# Patient Record
Sex: Female | Born: 1950 | Hispanic: Refuse to answer
Health system: Western US, Academic
[De-identification: ages and names within clinical notes are randomized; demographics above are authoritative.]

## PROBLEM LIST (undated history)

## (undated) ENCOUNTER — Emergency Department (HOSPITAL_BASED_OUTPATIENT_CLINIC_OR_DEPARTMENT_OTHER): Admission: EM | Payer: Self-pay | Source: Home / Self Care

## (undated) DIAGNOSIS — F419 Anxiety disorder, unspecified: Secondary | ICD-10-CM

## (undated) DIAGNOSIS — K219 Gastro-esophageal reflux disease without esophagitis: Secondary | ICD-10-CM

## (undated) DIAGNOSIS — C50919 Malignant neoplasm of unspecified site of unspecified female breast: Secondary | ICD-10-CM

## (undated) DIAGNOSIS — I10 Essential (primary) hypertension: Secondary | ICD-10-CM

## (undated) DIAGNOSIS — A6000 Herpesviral infection of urogenital system, unspecified: Secondary | ICD-10-CM

## (undated) DIAGNOSIS — M545 Low back pain, unspecified: Secondary | ICD-10-CM

## (undated) DIAGNOSIS — K5900 Constipation, unspecified: Secondary | ICD-10-CM

## (undated) DIAGNOSIS — R110 Nausea: Secondary | ICD-10-CM

## (undated) DIAGNOSIS — K449 Diaphragmatic hernia without obstruction or gangrene: Secondary | ICD-10-CM

## (undated) DIAGNOSIS — F40240 Claustrophobia: Secondary | ICD-10-CM

## (undated) DIAGNOSIS — E785 Hyperlipidemia, unspecified: Secondary | ICD-10-CM

## (undated) DIAGNOSIS — J45909 Unspecified asthma, uncomplicated: Secondary | ICD-10-CM

## (undated) DIAGNOSIS — M170 Bilateral primary osteoarthritis of knee: Secondary | ICD-10-CM

## (undated) HISTORY — PX: UPPER GASTROINTESTINAL ENDOSCOPY: SHX5120

## (undated) HISTORY — PX: BREAST BIOPSY: SHX5072

## (undated) HISTORY — PX: OOPHORECTOMY: SHX5032

## (undated) HISTORY — PX: BREAST LUMPECTOMY: SHX5074

## (undated) HISTORY — DX: Essential (primary) hypertension: I10

## (undated) HISTORY — DX: Gastro-esophageal reflux disease without esophagitis: K21.9

## (undated) HISTORY — DX: Anxiety disorder, unspecified: F41.9

## (undated) HISTORY — DX: Malignant neoplasm of unspecified site of unspecified female breast: C50.919

## (undated) HISTORY — DX: Nausea: R11.0

## (undated) HISTORY — PX: JOINT REPLACEMENT: SHX530

## (undated) HISTORY — PX: OTHER SURGICAL HISTORY: SHX169

## (undated) HISTORY — PX: CHOLECYSTECTOMY: SHX55

## (undated) HISTORY — PX: OVARY SURGERY: SHX727

## (undated) HISTORY — PX: REPLACEMENT TOTAL KNEE: SUR1224

## (undated) HISTORY — DX: Hyperlipidemia, unspecified: E78.5

## (undated) HISTORY — DX: Herpesviral infection of urogenital system, unspecified: A60.00

## (undated) HISTORY — PX: TOOTH EXTRACTION: SUR596

## (undated) HISTORY — PX: APPENDECTOMY (OPEN): SHX54

## (undated) HISTORY — DX: Bilateral primary osteoarthritis of knee: M17.0

---

## 2002-12-16 ENCOUNTER — Emergency Department: Admit: 2002-12-16 | Payer: Self-pay | Source: Emergency Department | Admitting: Emergency Medicine

## 2002-12-16 ENCOUNTER — Observation Stay
Admission: AD | Admit: 2002-12-16 | Disposition: A | Discharge status: Home / Self Care | Payer: Self-pay | Source: Ambulatory Visit | Admitting: Internal Medicine

## 2004-06-08 ENCOUNTER — Emergency Department: Admit: 2004-06-08 | Payer: Self-pay | Source: Emergency Department | Admitting: Emergency Medicine

## 2004-11-02 DIAGNOSIS — D352 Benign neoplasm of pituitary gland: Secondary | ICD-10-CM | POA: Insufficient documentation

## 2004-11-02 DIAGNOSIS — E7800 Pure hypercholesterolemia, unspecified: Secondary | ICD-10-CM | POA: Insufficient documentation

## 2006-09-20 ENCOUNTER — Emergency Department: Admit: 2006-09-20 | Payer: Self-pay | Source: Emergency Department | Admitting: Emergency Medicine

## 2006-09-20 LAB — CBC WITH AUTO DIFFERENTIAL CERNER
Basophils Absolute: 0 /mm3 (ref 0.0–0.2)
Basophils: 0 % (ref 0–2)
Eosinophils Absolute: 0 /mm3 (ref 0.0–0.2)
Eosinophils: 1 % (ref 0–5)
Granulocytes Absolute: 5.4 /mm3 (ref 1.8–8.1)
Hematocrit: 43 % (ref 37.0–47.0)
Hgb: 14.8 G/DL (ref 12.0–16.0)
Lymphocytes Absolute: 1.1 /mm3 (ref 0.5–4.4)
Lymphocytes: 15 % (ref 15–41)
MCH: 29 PG (ref 28.0–32.0)
MCHC: 34.4 G/DL (ref 32.0–36.0)
MCV: 84 FL (ref 80.0–100.0)
MPV: 7.7 FL (ref 7.4–10.4)
Monocytes Absolute: 0.5 /mm3 (ref 0.0–1.2)
Monocytes: 7 % (ref 0–11)
Neutrophils %: 77 % — ABNORMAL HIGH (ref 52–75)
Platelets: 341 /mm3 (ref 140–400)
RBC: 5.11 /mm3 (ref 4.20–5.40)
RDW: 14.4 % (ref 11.5–15.0)
WBC: 7 /mm3 (ref 3.5–10.8)

## 2006-09-20 LAB — BASIC METABOLIC PANEL - AH CERNER
Anion Gap: 14 mEq/L (ref 5–15)
BUN: 12 mg/dL (ref 6–20)
CO2: 27.2 mEq/L (ref 22.0–29.0)
Calcium: 9.4 mg/dL (ref 8.4–10.2)
Chloride: 99 mEq/L (ref 96–108)
Creatinine: 0.7 mg/dL (ref 0.4–1.1)
Glucose: 115 mg/dL
Osmolality Calculated: 291 mosm/kg (ref 282–298)
Potassium: 3.6 mEq/L (ref 3.3–5.1)
Sodium: 140 mEq/L (ref 135–145)
UN/CREA SOFT: 17 RATIO (ref 6–33)

## 2006-09-20 LAB — GFR

## 2006-09-20 LAB — HEMOLYSIS INDEX: Hemolysis Index: 9 Units

## 2006-11-02 HISTORY — PX: HYSTERECTOMY: SHX81

## 2007-02-07 ENCOUNTER — Observation Stay
Admission: EM | Admit: 2007-02-07 | Disposition: A | Discharge status: Home / Self Care | Payer: Self-pay | Source: Emergency Department | Admitting: Internal Medicine

## 2007-02-07 LAB — CBC WITH AUTO DIFFERENTIAL CERNER
Basophils Absolute: 0 /mm3 (ref 0.0–0.2)
Basophils: 0 % (ref 0–2)
Eosinophils Absolute: 0.1 /mm3 (ref 0.0–0.2)
Eosinophils: 1 % (ref 0–5)
Granulocytes Absolute: 4.2 /mm3 (ref 1.8–8.1)
Hematocrit: 42.4 % (ref 37.0–47.0)
Hgb: 13.9 G/DL (ref 12.0–16.0)
Lymphocytes Absolute: 1.1 /mm3 (ref 0.5–4.4)
Lymphocytes: 18 % (ref 15–41)
MCH: 27.9 PG — ABNORMAL LOW (ref 28.0–32.0)
MCHC: 32.7 G/DL (ref 32.0–36.0)
MCV: 85.3 FL (ref 80.0–100.0)
MPV: 7.4 FL (ref 7.4–10.4)
Monocytes Absolute: 0.6 /mm3 (ref 0.0–1.2)
Monocytes: 10 % (ref 0–11)
Neutrophils %: 71 % (ref 52–75)
Platelets: 313 /mm3 (ref 140–400)
RBC: 4.97 /mm3 (ref 4.20–5.40)
RDW: 14.8 % (ref 11.5–15.0)
WBC: 5.9 /mm3 (ref 3.5–10.8)

## 2007-02-07 LAB — URINALYSIS WITH MICROSCOPIC
Bilirubin, UA: NEGATIVE
Blood, UA: NEGATIVE
Glucose, UA: NEGATIVE
Ketones UA: NEGATIVE
Leukocyte Esterase, UA: NEGATIVE
Nitrite, UA: NEGATIVE
Protein, UR: NEGATIVE
Specific Gravity UA POCT: 1.003 — ABNORMAL LOW (ref 1.005–1.030)
Urine pH: 7 (ref 4.6–8.0)
Urobilinogen, UA: 0.2 EU/dL (ref 0.2–1.0)

## 2007-02-07 LAB — COMPREHENSIVE METABOLIC PANEL - AH CERNER
ALT: 10 U/L (ref 0–31)
AST (SGOT): 15 U/L (ref 0–31)
Albumin/Globulin Ratio: 1.4 (ref 1.1–2.2)
Albumin: 4.3 g/dL (ref 3.4–4.8)
Alkaline Phosphatase: 92 U/L (ref 35–104)
Anion Gap: 9 mEq/L (ref 5–15)
BUN: 11 mg/dL (ref 6–20)
Bilirubin, Total: 0.4 mg/dL (ref 0.0–1.0)
CA: 4.4 mEq/L (ref 3.8–4.6)
CO2: 29.1 mEq/L — ABNORMAL HIGH (ref 22.0–29.0)
Calcium: 10.1 mg/dL (ref 8.4–10.2)
Chloride: 101 mEq/L (ref 96–108)
Creatinine: 0.5 mg/dL (ref 0.4–1.1)
Globulin: 3 g/dL (ref 2.0–3.6)
Glucose: 102 mg/dL
Osmolality Calculated: 288 mosm/kg (ref 282–298)
Potassium: 3.3 mEq/L (ref 3.3–5.1)
Protein, Total: 7.3 g/dL (ref 6.4–8.3)
Sodium: 139 mEq/L (ref 133–145)
UN/CREA SOFT: 22 RATIO (ref 6–33)

## 2007-02-07 LAB — LIPID STUDY FASTING AM CERNER
Cholesterol / HDL Ratio: 5.02 RATIO — ABNORMAL HIGH (ref ?–4.47)
Cholesterol: 286 mg/dL — ABNORMAL HIGH (ref 100–199)
HDL: 57 mg/dL — ABNORMAL LOW (ref >65)
LDL: 213 mg/dL — ABNORMAL HIGH (ref ?–130)
Triglycerides: 82 md/dL (ref 10–190)
VLDL: 16 mg/dL (ref 0–40)

## 2007-02-07 LAB — TROPONIN I: Troponin I: 0 ng/mL — AB (ref 0.10–1.30)

## 2007-02-07 LAB — T4, FREE: T4 Free: 1.36 ng/dL (ref 0.94–1.65)

## 2007-02-07 LAB — CREATINE KINASE W/O REFLEX (SOFT): Creatine Kinase (CK): 55 U/L (ref 24–170)

## 2007-02-07 LAB — PT AND APTT
PT INR: 1.1 {INR} (ref 0.9–1.1)
PT: 12.5 s (ref 10.8–13.3)
PTT: 26 s (ref 21–32)

## 2007-02-07 LAB — GFR

## 2007-02-07 LAB — HEMOLYSIS INDEX: Hemolysis Index: 4 Units

## 2007-02-07 LAB — TSH: TSH: 2.51 u[IU]/mL (ref 0.400–4.610)

## 2007-02-08 LAB — CREATINE KINASE W/O REFLEX (SOFT)
Creatine Kinase (CK): 45 U/L (ref 24–170)
Creatine Kinase (CK): 46 U/L (ref 24–170)

## 2007-02-08 LAB — TROPONIN I
Troponin I: 0 ng/mL — AB (ref 0.10–1.30)
Troponin I: 0 ng/mL — AB (ref 0.10–1.30)

## 2007-02-08 LAB — HEMOLYSIS INDEX
Hemolysis Index: 1 Units
Hemolysis Index: 3 Units

## 2007-03-18 ENCOUNTER — Emergency Department: Admit: 2007-03-18 | Payer: Self-pay | Source: Emergency Department | Admitting: Emergency Medicine

## 2007-03-18 LAB — CBC WITH AUTO DIFFERENTIAL CERNER
Basophils Absolute: 0 /mm3 (ref 0.0–0.2)
Basophils: 0 % (ref 0–2)
Eosinophils Absolute: 0.1 /mm3 (ref 0.0–0.2)
Eosinophils: 2 % (ref 0–5)
Granulocytes Absolute: 4.3 /mm3 (ref 1.8–8.1)
Hematocrit: 41 % (ref 37.0–47.0)
Hgb: 13.6 G/DL (ref 12.0–16.0)
Lymphocytes Absolute: 1.1 /mm3 (ref 0.5–4.4)
Lymphocytes: 18 % (ref 15–41)
MCH: 28 PG (ref 28.0–32.0)
MCHC: 33.2 G/DL (ref 32.0–36.0)
MCV: 84.3 FL (ref 80.0–100.0)
MPV: 7.6 FL (ref 7.4–10.4)
Monocytes Absolute: 0.6 /mm3 (ref 0.0–1.2)
Monocytes: 9 % (ref 0–11)
Neutrophils %: 71 % (ref 52–75)
Platelets: 284 /mm3 (ref 140–400)
RBC: 4.86 /mm3 (ref 4.20–5.40)
RDW: 13.9 % (ref 11.5–15.0)
WBC: 6.1 /mm3 (ref 3.5–10.8)

## 2007-03-18 LAB — URINALYSIS WITH MICROSCOPIC
Bilirubin, UA: NEGATIVE
Blood, UA: NEGATIVE
Glucose, UA: NEGATIVE
Ketones UA: NEGATIVE
Leukocyte Esterase, UA: NEGATIVE
Nitrite, UA: NEGATIVE
Protein, UR: NEGATIVE
Specific Gravity UA POCT: 1.015 (ref 1.005–1.030)
Squamous Epithelial Cells, Urine: 4 /LPF — ABNORMAL HIGH (ref 0–0)
Urine pH: 6 (ref 4.6–8.0)
Urobilinogen, UA: 0.2 EU/dL (ref 0.2–1.0)

## 2007-03-18 LAB — COMPREHENSIVE METABOLIC PANEL - AH CERNER
ALT: 16 U/L (ref 0–31)
AST (SGOT): 33 U/L — ABNORMAL HIGH (ref 0–31)
Albumin/Globulin Ratio: 1.2 (ref 1.1–2.2)
Albumin: 4.1 g/dL (ref 3.4–4.8)
Alkaline Phosphatase: 94 U/L (ref 35–104)
Anion Gap: 10 mEq/L (ref 5–15)
BUN: 12 mg/dL (ref 6–20)
Bilirubin, Total: 0.4 mg/dL (ref 0.0–1.0)
CA: 4.1 mEq/L (ref 3.8–4.6)
CO2: 28.3 mEq/L (ref 22.0–29.0)
Calcium: 9.5 mg/dL (ref 8.4–10.2)
Chloride: 102 mEq/L (ref 96–108)
Creatinine: 0.7 mg/dL (ref 0.4–1.1)
Globulin: 3.3 g/dL (ref 2.0–3.6)
Glucose: 110 mg/dL
Osmolality Calculated: 290 mosm/kg (ref 282–298)
Potassium: 4.5 mEq/L (ref 3.3–5.1)
Protein, Total: 7.4 g/dL (ref 6.4–8.3)
Sodium: 140 mEq/L (ref 133–145)
UN/CREA SOFT: 17 RATIO (ref 6–33)

## 2007-03-18 LAB — CREATINE KINASE W/O REFLEX (SOFT): Creatine Kinase (CK): 84 U/L (ref 24–170)

## 2007-03-18 LAB — HEMOLYSIS INDEX: Hemolysis Index: 368 Units

## 2007-03-18 LAB — TROPONIN I: Troponin I: 0 ng/mL — AB (ref 0.10–1.30)

## 2007-03-18 LAB — PT AND APTT
PT INR: 1.1 {INR} (ref 0.9–1.1)
PT: 13 s (ref 10.8–13.3)
PTT: 29 s (ref 21–32)

## 2007-03-18 LAB — GFR

## 2007-04-26 ENCOUNTER — Emergency Department: Admit: 2007-04-26 | Payer: Self-pay | Source: Emergency Department | Admitting: Emergency Medicine

## 2007-04-26 LAB — I-STAT TROPONIN CERNER: i-STAT Troponin: 0 ng/mL

## 2007-04-26 LAB — GFR

## 2007-04-26 LAB — CBC WITH AUTO DIFFERENTIAL CERNER
Basophils Absolute: 0 /mm3 (ref 0.0–0.2)
Basophils: 0 % (ref 0–2)
Eosinophils Absolute: 0.1 /mm3 (ref 0.0–0.7)
Eosinophils: 1 % (ref 0–5)
Granulocytes Absolute: 4.8 /mm3 (ref 1.8–8.1)
Hematocrit: 42.3 % (ref 37.0–47.0)
Hgb: 14.5 G/DL (ref 12.0–16.0)
Lymphocytes Absolute: 1.5 /mm3 (ref 0.5–4.4)
Lymphocytes: 22 % (ref 15–41)
MCH: 28.8 PG (ref 28.0–32.0)
MCHC: 34.2 G/DL (ref 32.0–36.0)
MCV: 84.2 FL (ref 80.0–100.0)
MPV: 7.9 FL (ref 7.4–10.4)
Monocytes Absolute: 0.7 /mm3 (ref 0.0–1.2)
Monocytes: 10 % (ref 0–11)
Neutrophils %: 67 % (ref 52–75)
Platelets: 335 /mm3 (ref 140–400)
RBC: 5.03 /mm3 (ref 4.20–5.40)
RDW: 14.1 % (ref 11.5–15.0)
WBC: 7.1 /mm3 (ref 3.5–10.8)

## 2007-04-26 LAB — BASIC METABOLIC PANEL
BUN: 13 mg/dL (ref 8–20)
CO2: 30 mEq/L (ref 21–30)
Calcium: 9.6 mg/dL (ref 8.6–10.2)
Chloride: 103 mEq/L (ref 98–107)
Creatinine: 0.7 mg/dL (ref 0.6–1.5)
Glucose: 77 mg/dL (ref 70–100)
Potassium: 4.2 mEq/L (ref 3.6–5.0)
Sodium: 142 mEq/L (ref 136–146)

## 2007-04-26 LAB — CK: Creatine Kinase (CK): 56 U/L (ref 20–140)

## 2008-02-09 ENCOUNTER — Emergency Department: Admit: 2008-02-09 | Payer: Self-pay | Source: Emergency Department | Admitting: Emergency Medicine

## 2008-02-09 LAB — CBC AND DIFFERENTIAL
Basophils Absolute: 0 /mm3 (ref 0.0–0.2)
Basophils: 0 % (ref 0–2)
Eosinophils Absolute: 0.1 /mm3 (ref 0.0–0.7)
Eosinophils: 1 % (ref 0–5)
Granulocytes Absolute: 4.2 /mm3 (ref 1.8–8.1)
Hematocrit: 43.6 % (ref 37.0–47.0)
Hgb: 14.3 G/DL (ref 12.0–16.0)
Immature Granulocytes Absolute: 0 CUMM (ref 0.0–0.0)
Immature Granulocytes: 0 % (ref 0–1)
Lymphocytes Absolute: 1.3 /mm3 (ref 0.5–4.4)
Lymphocytes: 21 % (ref 15–41)
MCH: 29.2 PG (ref 28.0–32.0)
MCHC: 32.8 G/DL (ref 32.0–36.0)
MCV: 89.2 FL (ref 80.0–100.0)
MPV: 9.9 FL (ref 9.4–12.3)
Monocytes Absolute: 0.6 /mm3 (ref 0.0–1.2)
Monocytes: 10 % (ref 0–11)
Neutrophils %: 68 % (ref 52–75)
Platelets: 315 /mm3 (ref 140–400)
RBC: 4.89 /mm3 (ref 4.20–5.40)
RDW: 13.8 % (ref 11.5–15.0)
WBC: 6.11 /mm3 (ref 3.50–10.80)

## 2008-02-09 LAB — COMPREHENSIVE METABOLIC PANEL - AH CERNER
ALT: 15 U/L (ref 0–31)
AST (SGOT): 23 U/L (ref 0–31)
Albumin/Globulin Ratio: 1.3 (ref 1.1–2.2)
Albumin: 4 g/dL (ref 3.4–4.8)
Alkaline Phosphatase: 82 U/L (ref 35–104)
Anion Gap: 9 mEq/L (ref 5–15)
BUN: 9 mg/dL (ref 6–20)
Bilirubin, Total: 0.3 mg/dL (ref 0.0–1.0)
CA: 3.9 mEq/L (ref 3.8–4.6)
CO2: 28.6 mEq/L (ref 22.0–29.0)
Calcium: 8.9 mg/dL (ref 8.4–10.2)
Chloride: 101 mEq/L (ref 96–108)
Creatinine: 0.7 mg/dL (ref 0.4–1.1)
Globulin: 3.2 g/dL (ref 2.0–3.6)
Glucose: 109 mg/dL — ABNORMAL HIGH (ref 70–100)
Osmolality Calculated: 287 mosm/kg (ref 282–298)
Potassium: 4.2 mEq/L (ref 3.3–5.1)
Protein, Total: 7.2 g/dL (ref 6.4–8.3)
Sodium: 139 mEq/L (ref 133–145)
UN/CREA SOFT: 13 RATIO (ref 6–33)

## 2008-02-09 LAB — GFR

## 2008-02-09 LAB — HEMOLYSIS INDEX: Hemolysis Index: 141 Units

## 2008-02-09 LAB — TROPONIN I: Troponin I: 0.01 ng/mL

## 2008-02-09 LAB — CREATINE KINASE W/O REFLEX (SOFT): Creatine Kinase (CK): 58 U/L (ref 24–170)

## 2008-05-21 ENCOUNTER — Observation Stay
Admission: RE | Admit: 2008-05-21 | Disposition: A | Discharge status: Home / Self Care | Payer: Self-pay | Source: Ambulatory Visit | Admitting: Internal Medicine

## 2008-05-21 LAB — CBC
Hematocrit: 38.4 % (ref 37.0–47.0)
Hgb: 12.9 G/DL (ref 12.0–16.0)
MCH: 29 PG (ref 28.0–32.0)
MCHC: 33.6 G/DL (ref 32.0–36.0)
MCV: 86.3 FL (ref 80.0–100.0)
MPV: 9.4 FL (ref 9.4–12.3)
Platelets: 265 /mm3 (ref 140–400)
RBC: 4.45 /mm3 (ref 4.20–5.40)
RDW: 13.8 % (ref 11.5–15.0)
WBC: 6.2 /mm3 (ref 3.50–10.80)

## 2008-05-21 LAB — LIPID PANEL
Cholesterol: 240 mg/dL — ABNORMAL HIGH (ref 50–200)
HDL: 54 mg/dL (ref 35–86)
LDL Calculated: 171 mg/dL — ABNORMAL HIGH (ref 0–130)
Triglycerides: 76 mg/dL (ref 35–135)
VLDL Calculated: 15 mg/dL (ref 10–40)

## 2008-05-21 LAB — PT AND APTT
PT INR: 1.1 {INR} (ref 0.9–1.1)
PT: 13.1 s (ref 10.8–13.3)
PTT: 25 s (ref 21–32)

## 2008-05-21 LAB — BASIC METABOLIC PANEL
BUN: 8 mg/dL (ref 8–20)
CO2: 25 mEq/L (ref 21–30)
Calcium: 9.1 mg/dL (ref 8.6–10.2)
Chloride: 105 mEq/L (ref 98–107)
Creatinine: 0.7 mg/dL (ref 0.6–1.5)
Glucose: 98 mg/dL (ref 70–100)
Potassium: 3.3 mEq/L — ABNORMAL LOW (ref 3.6–5.0)
Sodium: 141 mEq/L (ref 136–146)

## 2008-05-21 LAB — GFR

## 2008-05-21 LAB — CK: Creatine Kinase (CK): 45 U/L (ref 20–140)

## 2008-05-21 LAB — TSH: TSH: 2.32 u[IU]/mL (ref 0.465–4.680)

## 2008-05-21 LAB — TROPONIN I QUANTITATIVE LEVEL CERNER: Troponin I: 0 ng/mL

## 2009-03-26 ENCOUNTER — Emergency Department: Admit: 2009-03-26 | Payer: Self-pay | Source: Emergency Department | Admitting: Emergency Medicine

## 2009-03-26 LAB — CBC AND DIFFERENTIAL
Basophils Absolute: 0 /mm3 (ref 0.0–0.2)
Basophils: 0 % (ref 0–2)
Eosinophils Absolute: 0.1 /mm3 (ref 0.0–0.7)
Eosinophils: 1 % (ref 0–5)
Granulocytes Absolute: 6.2 /mm3 (ref 1.8–8.1)
Hematocrit: 43.2 % (ref 37.0–47.0)
Hgb: 14.5 G/DL (ref 12.0–16.0)
Immature Granulocytes Absolute: 0 CUMM (ref 0.0–0.0)
Immature Granulocytes: 0 % (ref 0–1)
Lymphocytes Absolute: 1.3 /mm3 (ref 0.5–4.4)
Lymphocytes: 16 % (ref 15–41)
MCH: 28.5 PG (ref 28.0–32.0)
MCHC: 33.6 G/DL (ref 32.0–36.0)
MCV: 85 FL (ref 80.0–100.0)
MPV: 9.5 FL (ref 9.4–12.3)
Monocytes Absolute: 0.6 /mm3 (ref 0.0–1.2)
Monocytes: 7 % (ref 0–11)
Neutrophils %: 76 % — ABNORMAL HIGH (ref 52–75)
Platelets: 283 /mm3 (ref 140–400)
RBC: 5.08 /mm3 (ref 4.20–5.40)
RDW: 13.6 % (ref 11.5–15.0)
WBC: 8.21 /mm3 (ref 3.50–10.80)

## 2009-03-26 LAB — HEMOLYSIS INDEX: Hemolysis Index: 11 Units

## 2009-03-26 LAB — COMPREHENSIVE METABOLIC PANEL
ALT: 8 U/L (ref 0–55)
AST (SGOT): 11 U/L (ref 5–34)
Albumin/Globulin Ratio: 0.9 (ref 0.9–2.2)
Albumin: 3.3 g/dL — ABNORMAL LOW (ref 3.5–5.0)
Alkaline Phosphatase: 98 U/L (ref 40–150)
BUN: 13 mg/dL (ref 7–19)
Bilirubin, Total: 0.4 mg/dL (ref 0.2–1.2)
CO2: 27 mEq/L (ref 22–29)
Calcium: 9.3 mg/dL (ref 8.9–10.0)
Chloride: 103 mEq/L (ref 96–107)
Creatinine: 0.7 mg/dL (ref 0.6–1.0)
Globulin: 3.7 g/dL — ABNORMAL HIGH (ref 2.0–3.6)
Glucose: 98 mg/dL (ref 70–110)
Potassium: 3.5 mEq/L (ref 3.5–5.1)
Protein, Total: 7 g/dL (ref 6.0–8.3)
Sodium: 142 mEq/L (ref 136–145)

## 2009-03-26 LAB — GFR

## 2009-03-26 LAB — TROPONIN I: Troponin I: 0 ng/mL — AB

## 2009-03-26 LAB — CREATINE KINASE W/O REFLEX (SOFT): Creatine Kinase (CK): 32 U/L (ref 29–168)

## 2009-04-30 ENCOUNTER — Emergency Department: Admit: 2009-04-30 | Payer: Self-pay | Source: Emergency Department | Admitting: Emergency Medicine

## 2009-04-30 LAB — BASIC METABOLIC PANEL
BUN: 10 mg/dL (ref 7–19)
CO2: 27 mEq/L (ref 22–29)
Calcium: 9.3 mg/dL (ref 8.9–10.0)
Chloride: 103 mEq/L (ref 96–107)
Creatinine: 0.7 mg/dL (ref 0.6–1.0)
Glucose: 106 mg/dL (ref 70–110)
Potassium: 3.7 mEq/L (ref 3.5–5.1)
Sodium: 141 mEq/L (ref 136–145)

## 2009-04-30 LAB — GFR

## 2009-04-30 LAB — CBC AND DIFFERENTIAL
Basophils Absolute: 0 /mm3 (ref 0.0–0.2)
Basophils: 0 % (ref 0–2)
Eosinophils Absolute: 0.1 /mm3 (ref 0.0–0.7)
Eosinophils: 1 % (ref 0–5)
Granulocytes Absolute: 4.1 /mm3 (ref 1.8–8.1)
Hematocrit: 41.2 % (ref 37.0–47.0)
Hgb: 14.1 G/DL (ref 12.0–16.0)
Immature Granulocytes Absolute: 0 CUMM (ref 0.0–0.0)
Immature Granulocytes: 0 % (ref 0–1)
Lymphocytes Absolute: 1.6 /mm3 (ref 0.5–4.4)
Lymphocytes: 25 % (ref 15–41)
MCH: 28.4 PG (ref 28.0–32.0)
MCHC: 34.2 G/DL (ref 32.0–36.0)
MCV: 82.9 FL (ref 80.0–100.0)
MPV: 9.2 FL — ABNORMAL LOW (ref 9.4–12.3)
Monocytes Absolute: 0.5 /mm3 (ref 0.0–1.2)
Monocytes: 7 % (ref 0–11)
Neutrophils %: 66 % (ref 52–75)
Platelets: 313 /mm3 (ref 140–400)
RBC: 4.97 /mm3 (ref 4.20–5.40)
RDW: 14 % (ref 11.5–15.0)
WBC: 6.24 /mm3 (ref 3.50–10.80)

## 2009-04-30 LAB — URINALYSIS WITH MICROSCOPIC
Bilirubin, UA: NEGATIVE
Blood, UA: NEGATIVE
Glucose, UA: NEGATIVE
Ketones UA: NEGATIVE
Leukocyte Esterase, UA: NEGATIVE
Nitrite, UA: NEGATIVE
Protein, UR: NEGATIVE
RBC, UA: <=1 /HPF (ref 0–?)
Specific Gravity UA POCT: 1.005 (ref 1.005–1.030)
Squamous Epithelial Cells, Urine: 6 /HPF — ABNORMAL HIGH (ref 0–0)
Urine pH: 6 (ref 4.6–8.0)
Urobilinogen, UA: NORMAL mg/dL

## 2009-04-30 LAB — HEMOLYSIS INDEX: Hemolysis Index: 5 Units

## 2009-06-30 ENCOUNTER — Emergency Department: Admit: 2009-06-30 | Payer: Self-pay | Source: Emergency Department | Admitting: Emergency Medicine

## 2009-06-30 LAB — CBC AND DIFFERENTIAL
Basophils Absolute: 0 /mm3 (ref 0.0–0.2)
Basophils: 0 % (ref 0–2)
Eosinophils Absolute: 0.1 /mm3 (ref 0.0–0.7)
Eosinophils: 1 % (ref 0–5)
Granulocytes Absolute: 6.2 /mm3 (ref 1.8–8.1)
Hematocrit: 42.5 % (ref 37.0–47.0)
Hgb: 14.5 G/DL (ref 12.0–16.0)
Immature Granulocytes Absolute: 0 CUMM (ref 0.0–0.0)
Immature Granulocytes: 0 % (ref 0–1)
Lymphocytes Absolute: 2 /mm3 (ref 0.5–4.4)
Lymphocytes: 22 % (ref 15–41)
MCH: 28.7 PG (ref 28.0–32.0)
MCHC: 34.1 G/DL (ref 32.0–36.0)
MCV: 84.2 FL (ref 80.0–100.0)
MPV: 9.6 FL (ref 9.4–12.3)
Monocytes Absolute: 0.9 /mm3 (ref 0.0–1.2)
Monocytes: 9 % (ref 0–11)
Neutrophils %: 68 % (ref 52–75)
Platelets: 365 /mm3 (ref 140–400)
RBC: 5.05 /mm3 (ref 4.20–5.40)
RDW: 14.2 % (ref 11.5–15.0)
WBC: 9.12 /mm3 (ref 3.50–10.80)

## 2009-06-30 LAB — COMPREHENSIVE METABOLIC PANEL
ALT: 12 U/L (ref 0–55)
AST (SGOT): 12 U/L (ref 5–34)
Albumin/Globulin Ratio: 0.9 (ref 0.9–2.2)
Albumin: 3.4 g/dL — ABNORMAL LOW (ref 3.5–5.0)
Alkaline Phosphatase: 85 U/L (ref 40–150)
BUN: 12 mg/dL (ref 7–19)
Bilirubin, Total: 0.4 mg/dL (ref 0.2–1.2)
CO2: 27 mEq/L (ref 22–29)
Calcium: 9.9 mg/dL (ref 8.9–10.0)
Chloride: 102 mEq/L (ref 96–107)
Creatinine: 0.7 mg/dL (ref 0.6–1.0)
Globulin: 3.9 g/dL — ABNORMAL HIGH (ref 2.0–3.6)
Glucose: 102 mg/dL (ref 70–110)
Potassium: 3.8 mEq/L (ref 3.5–5.1)
Protein, Total: 7.3 g/dL (ref 6.0–8.3)
Sodium: 139 mEq/L (ref 136–145)

## 2009-06-30 LAB — PT AND APTT
PT INR: 1.1 {INR} (ref 0.9–1.1)
PT: 13.5 s — ABNORMAL HIGH (ref 10.8–13.3)
PTT: 26 s (ref 21–32)

## 2009-06-30 LAB — GFR

## 2009-06-30 LAB — HEMOLYSIS INDEX: Hemolysis Index: 9 Units

## 2010-04-13 ENCOUNTER — Emergency Department: Admit: 2010-04-13 | Payer: Self-pay | Source: Emergency Department | Admitting: Emergency Medicine

## 2010-04-13 LAB — CBC AND DIFFERENTIAL
Baso(Absolute): 0.02 10*3/uL (ref 0.00–0.20)
Basophils: 0 % (ref 0–2)
Eosinophils Absolute: 0.04 10*3/uL (ref 0.00–0.70)
Eosinophils: 1 % (ref 0–5)
Hematocrit: 39.7 % (ref 37.0–47.0)
Hgb: 13.6 g/dL (ref 12.0–16.0)
Immature Granulocytes Absolute: 0.02 10*3/uL
Immature Granulocytes: 0 % (ref 0–1)
Lymphocytes Absolute: 1.09 10*3/uL (ref 0.50–4.40)
Lymphocytes: 18 % (ref 15–41)
MCH: 28.6 pg (ref 28.0–32.0)
MCHC: 34.3 g/dL (ref 32.0–36.0)
MCV: 83.4 fL (ref 80.0–100.0)
MPV: 9.2 fL — ABNORMAL LOW (ref 9.4–12.3)
Monocytes Absolute: 0.46 10*3/uL (ref 0.00–1.20)
Monocytes: 8 % (ref 0–11)
Neutrophils Absolute: 4.52 10*3/uL
Neutrophils: 74 % (ref 52–75)
Platelets: 293 10*3/uL (ref 140–400)
RBC: 4.76 10*6/uL (ref 4.20–5.40)
RDW: 14 % (ref 12–15)
WBC: 6.13 10*3/uL (ref 3.50–10.80)

## 2010-04-13 LAB — COMPREHENSIVE METABOLIC PANEL
ALT: 14 U/L (ref 0–55)
AST (SGOT): 14 U/L (ref 5–34)
Albumin/Globulin Ratio: 1 (ref 0.9–2.2)
Albumin: 3.3 g/dL — ABNORMAL LOW (ref 3.5–5.0)
Alkaline Phosphatase: 95 U/L (ref 40–150)
Anion Gap: 9 (ref 5.0–15.0)
BUN: 6 mg/dL — ABNORMAL LOW (ref 7.0–19.0)
Bilirubin, Total: 0.2 mg/dL (ref 0.2–1.2)
CO2: 26 mEq/L (ref 22–29)
Calcium: 9.5 mg/dL (ref 8.5–10.5)
Chloride: 106 mEq/L (ref 98–107)
Creatinine: 0.7 mg/dL (ref 0.6–1.0)
Globulin: 3.3 g/dL (ref 2.0–3.6)
Glucose: 119 mg/dL — ABNORMAL HIGH (ref 70–100)
Potassium: 3.8 mEq/L (ref 3.5–5.1)
Protein, Total: 6.6 g/dL (ref 6.0–8.3)
Sodium: 141 mEq/L (ref 136–145)

## 2010-04-13 LAB — GFR: EGFR: >60

## 2010-04-13 LAB — TROPONIN I: Troponin I: 0.01 ng/mL (ref 0.00–0.09)

## 2010-04-13 LAB — HEMOLYSIS INDEX: Hemolysis Index: 4 Index (ref 0–18)

## 2010-12-23 ENCOUNTER — Emergency Department: Admit: 2010-12-23 | Payer: Self-pay | Source: Emergency Department | Admitting: Emergency Medicine

## 2010-12-23 LAB — COMPREHENSIVE METABOLIC PANEL
ALT: 9 U/L (ref 0–55)
AST (SGOT): 13 U/L (ref 5–34)
Albumin/Globulin Ratio: 0.9 (ref 0.9–2.2)
Albumin: 3.4 g/dL — ABNORMAL LOW (ref 3.5–5.0)
Alkaline Phosphatase: 91 U/L (ref 40–150)
Anion Gap: 13 (ref 5.0–15.0)
BUN: 13 mg/dL (ref 7.0–19.0)
Bilirubin, Total: 0.5 mg/dL (ref 0.2–1.2)
CO2: 24 mEq/L (ref 22–29)
Calcium: 9.5 mg/dL (ref 8.5–10.5)
Chloride: 106 mEq/L (ref 98–107)
Creatinine: 0.8 mg/dL (ref 0.6–1.0)
Globulin: 3.9 g/dL — ABNORMAL HIGH (ref 2.0–3.6)
Glucose: 104 mg/dL — ABNORMAL HIGH (ref 70–100)
Potassium: 3.4 mEq/L — ABNORMAL LOW (ref 3.5–5.1)
Protein, Total: 7.3 g/dL (ref 6.0–8.3)
Sodium: 143 mEq/L (ref 136–145)

## 2010-12-23 LAB — CBC AND DIFFERENTIAL
Basophils Absolute Automated: 0.02 10*3/uL (ref 0.00–0.20)
Basophils Automated: 0 % (ref 0–2)
Eosinophils Absolute Automated: 0.04 10*3/uL (ref 0.00–0.70)
Eosinophils Automated: 1 % (ref 0–5)
Hematocrit: 41.1 % (ref 37.0–47.0)
Hgb: 13.7 g/dL (ref 12.0–16.0)
Immature Granulocytes Absolute: 0.01 10*3/uL
Immature Granulocytes: 0 % (ref 0–1)
Lymphocytes Absolute Automated: 1.08 10*3/uL (ref 0.50–4.40)
Lymphocytes Automated: 19 % (ref 15–41)
MCH: 28.4 pg (ref 28.0–32.0)
MCHC: 33.3 g/dL (ref 32.0–36.0)
MCV: 85.1 fL (ref 80.0–100.0)
MPV: 9.3 fL — ABNORMAL LOW (ref 9.4–12.3)
Monocytes Absolute Automated: 0.38 10*3/uL (ref 0.00–1.20)
Monocytes: 7 % (ref 0–11)
Neutrophils Absolute: 4.21 10*3/uL (ref 1.80–8.10)
Neutrophils: 74 % (ref 52–75)
Nucleated RBC: 0 /100 WBC
Platelets: 269 10*3/uL (ref 140–400)
RBC: 4.83 10*6/uL (ref 4.20–5.40)
RDW: 14 % (ref 12–15)
WBC: 5.73 10*3/uL (ref 3.50–10.80)

## 2010-12-23 LAB — GFR: EGFR: >60

## 2010-12-23 LAB — IHS D-DIMER: D-Dimer: 0.22 ug/mL FEU (ref 0.00–0.49)

## 2010-12-23 LAB — CK: Creatine Kinase (CK): 22 U/L — ABNORMAL LOW (ref 29–168)

## 2010-12-23 LAB — HEMOLYSIS INDEX: Hemolysis Index: 53 Index — ABNORMAL HIGH (ref 0–18)

## 2010-12-23 LAB — TROPONIN I: Troponin I: 0.01 ng/mL (ref 0.00–0.09)

## 2010-12-27 ENCOUNTER — Emergency Department: Admit: 2010-12-27 | Payer: Self-pay | Source: Emergency Department | Admitting: Emergency Medicine

## 2010-12-28 LAB — CBC AND DIFFERENTIAL
Basophils Absolute Automated: 0.01 10*3/uL (ref 0.00–0.20)
Basophils Automated: 0 % (ref 0–2)
Eosinophils Absolute Automated: 0.08 10*3/uL (ref 0.00–0.70)
Eosinophils Automated: 1 % (ref 0–5)
Hematocrit: 39.9 % (ref 37.0–47.0)
Hgb: 13.6 g/dL (ref 12.0–16.0)
Immature Granulocytes Absolute: 0.03 10*3/uL
Immature Granulocytes: 0 % (ref 0–1)
Lymphocytes Absolute Automated: 1.72 10*3/uL (ref 0.50–4.40)
Lymphocytes Automated: 23 % (ref 15–41)
MCH: 28.6 pg (ref 28.0–32.0)
MCHC: 34.1 g/dL (ref 32.0–36.0)
MCV: 83.8 fL (ref 80.0–100.0)
MPV: 9.2 fL — ABNORMAL LOW (ref 9.4–12.3)
Monocytes Absolute Automated: 0.58 10*3/uL (ref 0.00–1.20)
Monocytes: 8 % (ref 0–11)
Neutrophils Absolute: 5.06 10*3/uL (ref 1.80–8.10)
Neutrophils: 68 % (ref 52–75)
Nucleated RBC: 0 /100 WBC
Platelets: 253 10*3/uL (ref 140–400)
RBC: 4.76 10*6/uL (ref 4.20–5.40)
RDW: 14 % (ref 12–15)
WBC: 7.45 10*3/uL (ref 3.50–10.80)

## 2010-12-28 LAB — CREATININE WHOLE BLOOD: Whole Blood Creatinine: 0.61 mg/dL (ref 0.40–1.10)

## 2011-07-12 ENCOUNTER — Emergency Department
Admit: 2011-07-12 | Disposition: A | Discharge status: Home / Self Care | Payer: Self-pay | Source: Emergency Department | Admitting: Emergency Medicine

## 2011-07-12 LAB — CBC AND DIFFERENTIAL
Basophils Absolute Automated: 0.02 10*3/uL (ref 0.00–0.20)
Basophils Automated: 0 % (ref 0–2)
Eosinophils Absolute Automated: 0.09 10*3/uL (ref 0.00–0.70)
Eosinophils Automated: 1 % (ref 0–5)
Hematocrit: 39.9 % (ref 37.0–47.0)
Hgb: 13.5 g/dL (ref 12.0–16.0)
Immature Granulocytes Absolute: 0.04 10*3/uL
Immature Granulocytes: 1 % (ref 0–1)
Lymphocytes Absolute Automated: 1.93 10*3/uL (ref 0.50–4.40)
Lymphocytes Automated: 28 % (ref 15–41)
MCH: 28.4 pg (ref 28.0–32.0)
MCHC: 33.8 g/dL (ref 32.0–36.0)
MCV: 83.8 fL (ref 80.0–100.0)
MPV: 9.5 fL (ref 9.4–12.3)
Monocytes Absolute Automated: 0.59 10*3/uL (ref 0.00–1.20)
Monocytes: 9 % (ref 0–11)
Neutrophils Absolute: 4.25 10*3/uL (ref 1.80–8.10)
Neutrophils: 62 % (ref 52–75)
Nucleated RBC: 0 /100 WBC
Platelets: 268 10*3/uL (ref 140–400)
RBC: 4.76 10*6/uL (ref 4.20–5.40)
RDW: 14 % (ref 12–15)
WBC: 6.88 10*3/uL (ref 3.50–10.80)

## 2011-07-12 LAB — COMPREHENSIVE METABOLIC PANEL
ALT: 13 U/L (ref 0–55)
AST (SGOT): 19 U/L (ref 5–34)
Albumin/Globulin Ratio: 1 (ref 0.9–2.2)
Albumin: 3.4 g/dL — ABNORMAL LOW (ref 3.5–5.0)
Alkaline Phosphatase: 94 U/L (ref 40–150)
Anion Gap: 13 (ref 5.0–15.0)
BUN: 14 mg/dL (ref 7.0–19.0)
Bilirubin, Total: 0.2 mg/dL (ref 0.2–1.2)
CO2: 25 mEq/L (ref 22–29)
Calcium: 9.5 mg/dL (ref 8.5–10.5)
Chloride: 104 mEq/L (ref 98–107)
Creatinine: 0.6 mg/dL (ref 0.6–1.0)
Globulin: 3.5 g/dL (ref 2.0–3.6)
Glucose: 118 mg/dL — ABNORMAL HIGH (ref 70–100)
Potassium: 3.9 mEq/L (ref 3.5–5.1)
Protein, Total: 6.9 g/dL (ref 6.0–8.3)
Sodium: 142 mEq/L (ref 136–145)

## 2011-07-12 LAB — TROPONIN I: Troponin I: <0.01 ng/mL (ref 0.00–0.09)

## 2011-07-12 LAB — GFR: EGFR: >60

## 2011-07-12 LAB — HEMOLYSIS INDEX: Hemolysis Index: 80 Index — ABNORMAL HIGH (ref 0–18)

## 2011-07-14 LAB — ECG 12-LEAD
Atrial Rate: 77 {beats}/min
Atrial Rate: 80 {beats}/min
P Axis: 56 degrees
P Axis: 66 degrees
P-R Interval: 158 ms
P-R Interval: 176 ms
Q-T Interval: 366 ms
Q-T Interval: 388 ms
QRS Duration: 82 ms
QRS Duration: 82 ms
QTC Calculation (Bezet): 422 ms
QTC Calculation (Bezet): 439 ms
R Axis: -11 degrees
R Axis: -13 degrees
T Axis: 12 degrees
T Axis: 3 degrees
Ventricular Rate: 77 {beats}/min
Ventricular Rate: 80 {beats}/min

## 2011-07-20 LAB — ECG 12-LEAD
Atrial Rate: 73 {beats}/min
P Axis: 61 degrees
P-R Interval: 168 ms
Q-T Interval: 384 ms
QRS Duration: 84 ms
QTC Calculation (Bezet): 423 ms
R Axis: -14 degrees
T Axis: 4 degrees
Ventricular Rate: 73 {beats}/min

## 2011-07-27 LAB — ECG 12-LEAD
Atrial Rate: 81 {beats}/min
P Axis: 68 degrees
P-R Interval: 160 ms
Q-T Interval: 372 ms
QRS Duration: 80 ms
QTC Calculation (Bezet): 432 ms
R Axis: -11 degrees
T Axis: 23 degrees
Ventricular Rate: 81 {beats}/min

## 2011-07-28 LAB — ECG 12-LEAD
Atrial Rate: 77 {beats}/min
P Axis: 70 degrees
P-R Interval: 152 ms
Q-T Interval: 366 ms
QRS Duration: 78 ms
QTC Calculation (Bezet): 414 ms
R Axis: -6 degrees
T Axis: 10 degrees
Ventricular Rate: 77 {beats}/min

## 2011-08-02 LAB — ECG 12-LEAD
Atrial Rate: 66 {beats}/min
P Axis: 72 degrees
P-R Interval: 168 ms
Q-T Interval: 380 ms
QRS Duration: 80 ms
QTC Calculation (Bezet): 398 ms
R Axis: -8 degrees
T Axis: 1 degrees
Ventricular Rate: 66 {beats}/min

## 2011-08-09 LAB — ECG 12-LEAD
Atrial Rate: 81 {beats}/min
P Axis: 68 degrees
P-R Interval: 158 ms
Q-T Interval: 396 ms
QRS Duration: 84 ms
QTC Calculation (Bezet): 460 ms
R Axis: -12 degrees
T Axis: 24 degrees
Ventricular Rate: 81 {beats}/min

## 2011-08-10 LAB — ECG 12-LEAD
Atrial Rate: 109 {beats}/min
Atrial Rate: 75 {beats}/min
P Axis: 41 degrees
P Axis: 73 degrees
P-R Interval: 154 ms
P-R Interval: 164 ms
Q-T Interval: 316 ms
Q-T Interval: 376 ms
QRS Duration: 80 ms
QRS Duration: 82 ms
QTC Calculation (Bezet): 419 ms
QTC Calculation (Bezet): 425 ms
R Axis: -3 degrees
R Axis: 32 degrees
T Axis: 47 degrees
T Axis: 8 degrees
Ventricular Rate: 109 {beats}/min
Ventricular Rate: 75 {beats}/min

## 2011-08-19 LAB — ECG 12-LEAD
Atrial Rate: 73 {beats}/min
P Axis: 67 degrees
P-R Interval: 166 ms
Q-T Interval: 398 ms
QRS Duration: 84 ms
QTC Calculation (Bezet): 438 ms
R Axis: -2 degrees
T Axis: 1 degrees
Ventricular Rate: 73 {beats}/min

## 2011-08-20 ENCOUNTER — Emergency Department
Admit: 2011-08-20 | Disposition: A | Discharge status: Home / Self Care | Payer: Self-pay | Source: Emergency Department | Admitting: Emergency Medicine

## 2011-08-20 LAB — CBC AND DIFFERENTIAL
Basophils Absolute Automated: 0.02 10*3/uL (ref 0.00–0.20)
Basophils Automated: 0 % (ref 0–2)
Eosinophils Absolute Automated: 0.1 10*3/uL (ref 0.00–0.70)
Eosinophils Automated: 1 % (ref 0–5)
Hematocrit: 41.7 % (ref 37.0–47.0)
Hgb: 14.2 g/dL (ref 12.0–16.0)
Immature Granulocytes Absolute: 0.03 10*3/uL
Immature Granulocytes: 0 % (ref 0–1)
Lymphocytes Absolute Automated: 2.45 10*3/uL (ref 0.50–4.40)
Lymphocytes Automated: 30 % (ref 15–41)
MCH: 28.5 pg (ref 28.0–32.0)
MCHC: 34.1 g/dL (ref 32.0–36.0)
MCV: 83.6 fL (ref 80.0–100.0)
MPV: 9.8 fL (ref 9.4–12.3)
Monocytes Absolute Automated: 0.78 10*3/uL (ref 0.00–1.20)
Monocytes: 9 % (ref 0–11)
Neutrophils Absolute: 4.94 10*3/uL (ref 1.80–8.10)
Neutrophils: 60 % (ref 52–75)
Nucleated RBC: 0 /100 WBC
Platelets: 336 10*3/uL (ref 140–400)
RBC: 4.99 10*6/uL (ref 4.20–5.40)
RDW: 14 % (ref 12–15)
WBC: 8.29 10*3/uL (ref 3.50–10.80)

## 2011-08-20 LAB — COMPREHENSIVE METABOLIC PANEL
ALT: 14 U/L (ref 0–55)
AST (SGOT): 13 U/L (ref 5–34)
Albumin/Globulin Ratio: 1.1 (ref 0.9–2.2)
Albumin: 3.7 g/dL (ref 3.5–5.0)
Alkaline Phosphatase: 110 U/L (ref 40–150)
Anion Gap: 11 (ref 5.0–15.0)
BUN: 13 mg/dL (ref 7.0–19.0)
Bilirubin, Total: 0.3 mg/dL (ref 0.2–1.2)
CO2: 26 mEq/L (ref 22–29)
Calcium: 9.6 mg/dL (ref 8.5–10.5)
Chloride: 101 mEq/L (ref 98–107)
Creatinine: 0.8 mg/dL (ref 0.6–1.0)
Globulin: 3.5 g/dL (ref 2.0–3.6)
Glucose: 106 mg/dL — ABNORMAL HIGH (ref 70–100)
Potassium: 2.9 mEq/L — ABNORMAL LOW (ref 3.5–5.1)
Protein, Total: 7.2 g/dL (ref 6.0–8.3)
Sodium: 138 mEq/L (ref 136–145)

## 2011-08-20 LAB — TROPONIN I: Troponin I: 0.02 ng/mL (ref 0.00–0.09)

## 2011-08-20 LAB — GFR: EGFR: >60

## 2011-08-20 LAB — LIPASE: Lipase: 15 U/L (ref 8–78)

## 2011-08-20 LAB — HEMOLYSIS INDEX: Hemolysis Index: 25 Index — ABNORMAL HIGH (ref 0–18)

## 2011-08-20 NOTE — Procedures (Signed)
PATIENT:          Amber [name]      CARDIOLOGIST: Lysle Rubens, MD      REFERRING PHYSICIAN:      MEDICAL RECORD NUMBER:    [id number]      DATE OF STUDY:    02/08/2007      PATIENT LOCATION/SERVICE: [id number]            ADENOSINE STRESS TEST            INDICATIONS:  Chest pain.            DESCRIPTION: Informed consent was obtained.  Adenosine was infused over 6      minutes with injection of a nuclear tracer agent at 3 minutes.  The patient      experienced no symptoms.  There were no diagnostic electrocardiogram      changes.  There were no arrhythmias.  Blood pressure response was      physiologic.            CONCLUSIONS:      1. Uneventful adenosine infusion.      2. Nuclear images pending radiology interpretation.                              Electronic Signing MD: Lysle Rubens, MD            JAJ:amw:CCA      D:   02/08/2007      T:   02/09/2007      #:   [id number]      N:   [id number]            cc:  Lysle Rubens, MD           Medical Records Clemon Chambers, MD

## 2011-08-20 NOTE — H&P (Unsigned)
PATIENT:                       Amber [name], Amber [name]      MEDICAL RECORD NUMBER:         [id number]      PATIENT LOCATION/SERVICE:      [id number]            DATE OF ADMISSION:             02/07/2007      ATTENDING PHYSICIAN:           Pershing Cox, MD            ADMITTING DIAGNOSIS:  Atypical chest discomfort.            OTHER DIAGNOSES:  Hypertension and hypercholesterolemia.            HISTORY OF PRESENT ILLNESS:  The patient is 76, admitted through the ER      because of atypical chest discomfort, lightheadedness, somewhat nausea.      The patient denies any abdominal pain, diarrhea, history of recent intake      of moderate to severe alcohol over the weekend.  Denies any orthopnea or      PND.            PAST MEDICAL HISTORY:  Hypertension and hypercholesterolemia.  Also, at      admission, she described some palpitations.            ALLERGIES:  No allergies, does not tolerate aspirin well.            SOCIAL HISTORY:  Negative smoking, moderate alcohol.            REVIEW OF SYSTEMS:  No orthopnea, no PND.  No abdominal pain.  No diarrhea.      No skin rash.  No headache, no double vision, no blurring of vision.            PHYSICAL EXAMINATION:  Patient is a middle-aged woman not in any acute      distress.  Skin:  Normal.  Sclerae anicteric.  CVS:  S1, S2.  Lungs:      Clear.  Abdomen:  Bowel sounds positive, nontender.  Extremities:  Edema      negative.  Pulses equal and bilateral.  Vitals:  Blood pressure of 150/80,      pulse of 76, respiratory rate 18.            EKG:  Normal sinus rhythm.  No acute ST elevation noted.            LABORATORY DATA:  Labs at the time of admission were:  WBC of 5.9,      hemoglobin 13, hematocrit 42, platelets 313.  Sodium 139, potassium 3.3,      BUN 11, creatinine 0.5.  First troponin was negative.            ASSESSMENT AND PLAN:      1.   Atypical chest discomfort.  Risk factors of hypertension,      hypercholesterolemia.  Will admit patient on telemetry.  Cardiac enzymes,       monitor.  Will do a lipid panel, free T4, TSH, stress thallium in the      morning by cardiovascular group.  Patient put on ulcer prophylaxis and deep      venous thrombosis prophylaxis.      2.   Hypertension.  Started on Norvasc.  Will follow  patient.                                    ___________________________________        Date Signed: _______________      Pershing Cox, MD                  A [id number]      D:    02/07/2007      T:    02/08/2007      #:    [id number]      N:    [id number]            cc:   Pershing Cox, MD

## 2011-08-20 NOTE — Procedures (Signed)
PATIENT:                       Amber [name], Amber [name]      MEDICAL RECORD NUMBER:         [id number]      PATIENT LOCATION/SERVICE:      [id number]            DATE OF PROCEDURE:             02/08/2007      PHYSICIAN:                     Bertram Millard Mignon Bechler, MD      REFERRING PHYSICIAN:            TAPE NO.: [id number]      COUNTER NO.: [id number]            CLINICAL INDICATIONS: Chest pain, abnormal heart sounds.                                            M-MODE/2-D            3.3      cm        Left Atrium                               (19-52mm)      2.6      cm        Aortic Root                               (19-76mm)      --       cm        Aortic Leaflet Separation                 (15-56mm)      --       cm        RVd                                       (7-5mm)      3.2      cm        LVs                                       (20-69mm)      4.6      cm        LVd                                       (37-57mm)      1.1      cm        IVSd                                      (6-7mm)      1.0  cm        LVPWd                                     (6-92mm)      31       %         Fractional Shortening                     (25-45%)      60-65    %         Estimated Ejection Fraction               (>55%)            No pericardial effusion.  No pleural effusion.                                                  DOPPLER                                   PEAK       MEAN    DEGREE OF VALVULAR   VALVE        VALVE    PEAK VELOCITY   GRADIENT   GRADIENT     REGURGITATION      AREA      Mitral        0.7           mmHg       mmHg                          cm. sq.                 m/s(0.6-1.3)      Aortic        1.3           mmHg       mmHg                          cm. sq.                 m/s(1.0-1.7)      Pulmonic      1.0           mmHg       mmHg          Mild            cm. sq.                 m/s(0.6-0.9)      Tricuspid     0.8           mmHg       mmHg          Trace           cm. sq.                 m/s(0.3-0.7)      LVOT          1.0           ---------  ---------      ---------      --------  m/s(0.7-1.7)    -                                        -            TWO-DIMENSIONAL AND DOPPLER COMMENTS:         1. Technically good study.         2. Normal left ventricular size, thickness and function with estimated         ejection fraction 60-65%.  No regional wall motion abnormalities         apparent.         3. The left atrium is not dilated.         4. Structurally normal mitral valve without stenosis or prolapse.  No         insufficiency.         5. Trileaflet aortic valve without significant sclerosis and without         evidence of stenosis. No insufficiency.         6. The ascending aorta was not dilated.         7. The pulmonary artery was not dilated.         8. The pulmonary valve was structurally normal.  Mild insufficiency.         9. The right atrium and right ventricle are of normal size and function.               10. The tricuspid valve was structurally normal. Trace insufficiency.         11. No intracardiac masses, thrombi or vegetations identified.         12. The mitral valve inflow pattern was normal consistent with normal         left ventricular diastolic function.         13. No pericardial effusion present.         14. No prior study for comparison.             Electronic Signing MD: Bertram Millard Costa Jha, MD            SMD:amw:CCA      D:     02/08/2007      T:     02/09/2007      #:     [id number]      N:     [id number]            cc:    Bertram Millard Solina Heron, MD

## 2011-08-20 NOTE — Consults (Signed)
PATIENT:                       Amber [name], Amber [name]      MEDICAL RECORD NUMBER:         [id number]      PATIENT LOCATION/SERVICE:      [id number]            DATE OF CONSULTATION:      CONSULTING PHYSICIAN:          Bertram Millard Heba Ige, MD      REFERRING PHYSICIAN:            CARDIAC CONSULTATION            REASON FOR CONSULTATION:  Abnormal thallium.            HISTORY OF PRESENT ILLNESS:  The patient is a 60 year old female without      history of prior coronary or other cardiac disease with risk factors of      hypertension and high lipids admitted presently to [facility] with      chest pain and nausea.  She is ruled out for myocardial infarction.  She      underwent stress thallium today showing inferior septal apical ischemia      with septal dyskinesis and an ejection fraction of 35%.  She had an      echocardiogram today which was actually quite normal.  She does not smoke      cigarettes.  There is a family history of heart disease, but in her father      at age greater than age 32.            PAST MEDICAL HISTORY:  Hypertension, high lipids.            PAST SURGICAL HISTORY:  None.            CURRENT MEDICATIONS:  Prior to admission:  Zebeta 2.5 daily,      hydrochlorothiazide 12.5 daily, Valium 5 mg daily.  Hospital medications:      Norvasc 5 mg daily, Lovenox 40 mg daily, Protonix 40 mg daily, potassium 20      mEq daily.            ALLERGIES:  She states aspirin, but she mentions stomach upset with      aspirin.            REVIEW OF SYSTEMS:  Negative except as above including constitutional,      eyes, ears, nose, throat, respiratory, GU, musculoskeletal, neurologic,      skin and psychiatric.            PHYSICAL EXAMINATION:  Temperature 96.4, heart rate 77, respiratory rate      20, blood pressure 107/49.  She is alert and oriented x3, in no acute      distress.  Skin:  Without pallor, jaundice, diaphoresis.  Conjunctivae      pink.  Mucous membranes moist. No proptosis, lid lag, thyromegaly.  Jugular       venous pressure is estimated at 5.  Carotid upstrokes normal without      bruits.  Cardiac exam:  Regular rate and rhythm without murmurs, gallops,      or rubs.  PMI not displaced.  Lungs:  Clear to auscultation.  Respirations      unlabored.  Abdomen:  Soft, nontender, nondistended, without      hepatosplenomegaly, ascites, mass, or bruits.  Femoral and dorsal pedis  pulses 2+ and symmetrical.  Lower extremities:  Without clubbing, cyanosis,      or edema.  Mood and affect within normal limits.  Motor strength 5/5      throughout.            LABORATORY DATA:  Hemoglobin and hematocrit 13 and 42, white count 5.9,      platelets 313.  BUN and creatinine 11 and 0.5, potassium 3.3, CK negative      x3, troponin negative x3.  INR 1.1.  HDL 57, LDL 213.            EKG:  Normal sinus rhythm.  Normal EKG.            IMPRESSION:      1.   Admission for chest pain with nausea and palpitations.  Negative rule      out myocardial infarction.  No prior history of coronary artery disease.      Risk factors of hypertension and high lipids.  LDL quite high.      2.   Abnormal exercise thallium on 02/08/2007.  Inferoseptal and apical      ischemia.  Ejection fraction 35% by thallium, but normal on today's      echocardiogram.      3.   Hypertension, presently controlled.      4.   Hyperlipidemia.  LDL greater than 200.            PLAN:  I have consented her for cardiac catheterization in the morning.      She describes an intolerance of aspirin with stomach upset.  I will      premedicate her with Plavix tonight and give her Plavix tomorrow morning.      I will give her 300 mg given prior stomach upset with aspirin.  If      intervening tomorrow, she should receive chewable aspirin in the lab.  If      she is being treated with antiplatelet agents for coronary disease and      intervention, would require stomach protection while on antiplatelets      considering she has an intolerance of aspirin if she does require  stenting      and if anatomy is suitable, might consider bare metal rather than drug      stenting to minimize duration of double antiplatelet therapy.  She will      clearly need statins for her markedly elevated LDL.                                    Electronic Signing MD: Bertram Millard Davie Sagona, MD                  [id number]      D:    02/08/2007      T:    02/09/2007      #:    [id number]      N:    [id number]            cc:   Bertram Millard Adora Yeh, MD

## 2011-08-21 NOTE — H&P (Signed)
Account Number: [id number]      Document ID: [id number]      Admit Date: 05/21/2008            Patient Location: [id number]      Patient Type: I            ATTENDING PHYSICIAN: Caren Macadam, MD                  ADMITTING DIAGNOSIS:      Chest pain.            HISTORY OF PRESENT ILLNESS:      Patient is a 60 year old female with history of hypertension,      hypercholesterolemia, obesity who was transferred from [facility] emergency      room where she had presented with ongoing left arm pain and numbness along      with nausea and sweating which had started the previous stay.  Felt      somewhat dizzy.  No vomiting.  No sweating.  No radiation.  The patient has      had history of an abnormal stress thallium test, however, an angiogram done      in April 2008 by Dr. Hyacinth Meeker was apparently normal.  She had forgotten to      take her hypertension medications prior to her symptoms.  There was no      chest pain per se.  No shortness of breath.  Her initial blood pressure was      elevated at 161/90; however, it settled down into the 130s/80s.  O2 sat was      97% on room air.  EKG was nonspecific T-wave inversions in lead III and      aVF.  Two sets of cardiac enzymes were negative at [facility] emergency      room.  Chest x-ray was also reportedly normal there.  The patient was given      aspirin there.  No symptoms at present.            PAST MEDICAL HISTORY AND PAST SURGICAL HISTORY:      1.  Hypertension.      2.  Hypercholesterolemia.      3.  Depression.      4.  Panic disorder/anxiety disorder.      5.  Obesity with a BMI greater than 40.      6.  DJD of the back.      7.  Peripheral edema.      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      9.  Allergic rhinitis.      10. Abnormal findings on stress thallium test in April 2008 with inferior      septal and apical ischemia and septal dyskinesis, the ejection fraction 35%      in April 2008.  Echocardiogram done with mild pulmonary  incontinence in      April 2008.  Cardiac catheterization was done by Dr. Hyacinth Meeker apparently      normal and stress thallium was reportedly false positive because of that.      11.  Palpitations.      12. Right oophorectomy for ovarian cyst and salpingectomy.      13.  Lipoma of the back excision.      14.  C-section.            MEDICATIONS PRIOR TO ADMISSION:      1.  Inderal 80 mg  p.o. daily.      2. Hydrochlorothiazide 25 mg p.o. daily.            ALLERGIES:      1.  STATINS.  ZOCOR, MEVACOR, WHICH CAUSES INTOLERANCE AND GI SYMPTOMS.      2.  ACYCLOVIR WITH GI SYMPTOMS.      3.  TETANUS TOXOID, LOCAL REACTION RASH.      4.  AUGMENTIN GI SYMPTOMS.      5.  SULFONAMIDES, GI SYMPTOMS.      6.  ASPIRIN, GI SYMPTOMS; HOWEVER, THE PATIENT CAN TOLERATE LOW-DOSE      ASPIRIN WHICH SHE IS ON AT PRESENT.            PERSONAL HISTORY:      The patient is single.  Lives alone.  Nonsmoker.  Occasional alcohol.  No      illicit drugs.            FAMILY HISTORY:      Mother had breast cancer at age 72.  One brother diabetes mellitus and died      due to alcoholism.  Father had heart failure in his 9s.  One brother was      stabbed to death.   Maternal aunt with uterine cancer, died of the same      cause.            REVIEW OF SYSTEMS:      No fever, chills, cough.  Other review of systems was normal at present.            PHYSICAL EXAMINATION:      VITAL SIGNS:  Temperature 96.4 degrees F, pulse 65, respirations 18, blood      pressure 87, O2 saturation 98% on room air, height 155 cm.  Weight 120      kilograms.      GENERAL:  The patient is morbidly obese.  No apparent distress.      HEENT:  Neck Supple.  No jugular venous distention.  No carotid bruit.  No      cyanosis.  No icterus.  No thyromegaly.      CHEST:  Clear to auscultation, nontender.      CARDIAC:  S1, S2, regular without murmur or gallop.  No JVD.      ABDOMEN:  Soft, nontender, no palpable hepatosplenomegaly or masses, is      obese.      EXTREMITIES:  Lower  extremities without edema or calf tenderness or      redness.  Upper extremities without clubbing or cyanosis.      CENTRAL NERVOUS SYSTEM:  The patient moves all 4 extremities with good      strength.      PSYCHIATRIC:  Patient alert, oriented and has good insight.            LABORATORY DATA:      From ECM as far as workup at [facility] ER:  2 sets of cardiac enzymes      negative.  First troponin less than 0.05, second troponin was borderline at      0.8 with CK-MB of less than 1 and 1.4 respectively and myoglobin of 47 and      58 respectively.            EKG:  Nonspecific T-wave inversions in III and AVF.            Chest x-ray was clear.  ASSESSMENT AND PLAN:      A 60 year old female with a left arm pain/numbness with a second troponin      being borderline after being transferred, third enzyme is pending.      Nonspecific EKG abnormality.  The patient was given aspirin.  Would      continue with her Inderal and hydrochlorothiazide.  EKG today.  Would check      chemistry, CBC, TSH, protime/aPTT, lipid profile.  Sublingual nitroglycerin      p.r.n. for chest pain.  Stat EKG if chest pain.  O2 to keep sats greater      than or equal to 96%.  Would notify cardiology.  Dr. Lindon Romp becomes the      medical attending from now.                        Electronic Signing Provider      _______________________________     Date/Time Signed: _____________      Benito Mccreedy MD ([id number])            D:  05/21/2008 07:50 AM by Benito Mccreedy, MD ([id number])      T:  05/21/2008 08:34 AM by [id number]          Everlean Cherry: [id number]) (Doc ID: [id number])                  cc:

## 2011-08-24 ENCOUNTER — Emergency Department
Admit: 2011-08-24 | Disposition: A | Discharge status: Home / Self Care | Payer: Self-pay | Source: Emergency Department | Admitting: Internal Medicine

## 2011-08-24 LAB — CBC AND DIFFERENTIAL
Basophils Absolute Automated: 0.02 10*3/uL (ref 0.00–0.20)
Basophils Automated: 0 % (ref 0–2)
Eosinophils Absolute Automated: 0.05 10*3/uL (ref 0.00–0.70)
Eosinophils Automated: 1 % (ref 0–5)
Hematocrit: 43.1 % (ref 37.0–47.0)
Hgb: 14.7 g/dL (ref 12.0–16.0)
Immature Granulocytes Absolute: 0.02 10*3/uL
Immature Granulocytes: 0 % (ref 0–1)
Lymphocytes Absolute Automated: 1.51 10*3/uL (ref 0.50–4.40)
Lymphocytes Automated: 21 % (ref 15–41)
MCH: 28.6 pg (ref 28.0–32.0)
MCHC: 34.1 g/dL (ref 32.0–36.0)
MCV: 83.9 fL (ref 80.0–100.0)
MPV: 9.8 fL (ref 9.4–12.3)
Monocytes Absolute Automated: 0.53 10*3/uL (ref 0.00–1.20)
Monocytes: 7 % (ref 0–11)
Neutrophils Absolute: 5.25 10*3/uL (ref 1.80–8.10)
Neutrophils: 71 % (ref 52–75)
Nucleated RBC: 0 /100 WBC
Platelets: 292 10*3/uL (ref 140–400)
RBC: 5.14 10*6/uL (ref 4.20–5.40)
RDW: 14 % (ref 12–15)
WBC: 7.36 10*3/uL (ref 3.50–10.80)

## 2011-08-24 LAB — COMPREHENSIVE METABOLIC PANEL
ALT: 16 U/L (ref 0–55)
AST (SGOT): 14 U/L (ref 5–34)
Albumin/Globulin Ratio: 0.9 (ref 0.9–2.2)
Albumin: 3.6 g/dL (ref 3.5–5.0)
Alkaline Phosphatase: 101 U/L (ref 40–150)
Anion Gap: 12 (ref 5.0–15.0)
BUN: 9 mg/dL (ref 7.0–19.0)
Bilirubin, Total: 0.3 mg/dL (ref 0.2–1.2)
CO2: 26 mEq/L (ref 22–29)
Calcium: 9.6 mg/dL (ref 8.5–10.5)
Chloride: 102 mEq/L (ref 98–107)
Creatinine: 0.8 mg/dL (ref 0.6–1.0)
Globulin: 4.1 g/dL — ABNORMAL HIGH (ref 2.0–3.6)
Glucose: 113 mg/dL — ABNORMAL HIGH (ref 70–100)
Potassium: 3 mEq/L — ABNORMAL LOW (ref 3.5–5.1)
Protein, Total: 7.7 g/dL (ref 6.0–8.3)
Sodium: 140 mEq/L (ref 136–145)

## 2011-08-24 LAB — HEMOLYSIS INDEX: Hemolysis Index: 8 Index (ref 0–18)

## 2011-08-24 LAB — TROPONIN I: Troponin I: <0.01 ng/mL (ref 0.00–0.09)

## 2011-08-24 LAB — GFR: EGFR: >60

## 2011-09-04 LAB — ECG 12-LEAD
Atrial Rate: 81 {beats}/min
Atrial Rate: 88 {beats}/min
P Axis: 52 degrees
P Axis: 63 degrees
P-R Interval: 160 ms
P-R Interval: 162 ms
Q-T Interval: 368 ms
Q-T Interval: 384 ms
QRS Duration: 80 ms
QRS Duration: 82 ms
QTC Calculation (Bezet): 445 ms
QTC Calculation (Bezet): 446 ms
R Axis: -11 degrees
R Axis: -7 degrees
T Axis: 1 degrees
T Axis: 4 degrees
Ventricular Rate: 81 {beats}/min
Ventricular Rate: 88 {beats}/min

## 2011-09-05 LAB — ECG 12-LEAD
Atrial Rate: 83 {beats}/min
P Axis: 51 degrees
P-R Interval: 164 ms
Q-T Interval: 356 ms
QRS Duration: 84 ms
QTC Calculation (Bezet): 418 ms
R Axis: -13 degrees
T Axis: 4 degrees
Ventricular Rate: 83 {beats}/min

## 2011-10-20 NOTE — Procedures (Signed)
PATIENT:                       Amber [name], Amber [name]      MEDICAL RECORD NUMBER:         [id number]      PATIENT LOCATION/SERVICE:      [facility] 02/09/2007            DATE OF PROCEDURE:             02/09/2007      SURGEON:                       Donzetta Starch, MD            PRE-PROCEDURE DIAGNOSIS:  Chest pain, hypertension, hyperlipidemia,      abnormal thallium study with multivessel ischemia.            POST-PROCEDURE DIAGNOSIS:            PROCEDURE:      1. Left heart catheterization.      2. Selective right and left coronary cineangiography.      3. Left ventriculography.            DESCRIPTION OF PROCEDURE:  The patient was taken to the cardiac      catheterization laboratory after informed consent was obtained. She was      premedicated with fentanyl, Benadryl and Versed. The right femoral area was      prepared and draped in the usual sterile manner.  Xylocaine was      infiltrated.  The right femoral artery was punctured and a 6-French sheath      was placed percutaneously.  Using a 6-French JL-4 and 3DRC catheters      selective left and right coronary cineangiography was performed with hand      injection of angiographic dye in varying projections.    Left heart      catheterization and left ventriculography were performed using a 6-French      pigtail catheter with pull back pressure measurements across the aortic      valve. Limited right iliac angiography was performed.  The sheath was      withdrawn.  The puncture site was closed with #6-French Angio-Seal.  There      were no complications or allergic reactions.                        CATHETERIZATION FINDINGS            I.    HEMODYNAMICS:                  Rhythm - sinus, rate 91.            Left Ventricle - 141/Z11.                  Pullback pressures post-ventriculography -                   Left ventricular - 148/Z17, aorta 143/79, mean 104.            II.   CORONARY CINEANGIOGRAMS:                  a.     Left Main - The left main coronary artery was  normal and      without stenosis.  b.     Left Anterior Descending Coronary Artery - The left anterior      descending coronary artery and diagonal branches are normal without      stenosis.                  c.     Circumflex Coronary Artery - The circumflex coronary artery      was normal and without stenosis.                  d.     Right Coronary Artery - The right coronary artery was dominant      and normal without stenosis.            III.  LEFT VENTRICULOGRAM:  RAO left ventriculography showed normal left      ventricular systolic function  without regional wall motion abnormalities.            SUMMARY:      1. Normal coronary arteries.      2. Normal left ventricular systolic function.      3. Atypical chest pain with false positive thallium stress test.                        Electronic Signing MD: Donzetta Starch, MD            LAM:amw:CCA      D:    02/09/2007      T:    02/10/2007      #:    [id number]      N:    [id number]            cc:   Pershing Cox, MD            Donzetta Starch, MD

## 2011-12-17 ENCOUNTER — Emergency Department
Admit: 2011-12-17 | Discharge: 2011-12-17 | Disposition: A | Discharge status: Home / Self Care | Payer: Self-pay | Source: Emergency Department | Admitting: Emergency Medicine

## 2011-12-17 LAB — URINALYSIS, REFLEX TO MICROSCOPIC EXAM IF INDICATED
Bilirubin, UA: NEGATIVE
Blood, UA: NEGATIVE
Glucose, UA: NEGATIVE
Ketones UA: NEGATIVE
Leukocyte Esterase, UA: NEGATIVE
Nitrite, UA: NEGATIVE
Protein, UR: NEGATIVE
Specific Gravity UA POCT: 1.009 (ref 1.001–1.035)
Urine pH: 7 (ref 5.0–8.0)
Urobilinogen, UA: NEGATIVE mg/dL

## 2011-12-17 LAB — CBC AND DIFFERENTIAL
Basophils Absolute Automated: 0.02 10*3/uL (ref 0.00–0.20)
Basophils Automated: 0 % (ref 0–2)
Eosinophils Absolute Automated: 0.15 10*3/uL (ref 0.00–0.70)
Eosinophils Automated: 2 % (ref 0–5)
Hematocrit: 42.4 % (ref 37.0–47.0)
Hgb: 13.8 g/dL (ref 12.0–16.0)
Immature Granulocytes Absolute: 0.01 10*3/uL
Immature Granulocytes: 0 % (ref 0–1)
Lymphocytes Absolute Automated: 1.53 10*3/uL (ref 0.50–4.40)
Lymphocytes Automated: 26 % (ref 15–41)
MCH: 27.8 pg — ABNORMAL LOW (ref 28.0–32.0)
MCHC: 32.5 g/dL (ref 32.0–36.0)
MCV: 85.5 fL (ref 80.0–100.0)
MPV: 9.5 fL (ref 9.4–12.3)
Monocytes Absolute Automated: 0.49 10*3/uL (ref 0.00–1.20)
Monocytes: 8 % (ref 0–11)
Neutrophils Absolute: 3.75 10*3/uL (ref 1.80–8.10)
Neutrophils: 63 % (ref 52–75)
Nucleated RBC: 0 /100 WBC
Platelets: 314 10*3/uL (ref 140–400)
RBC: 4.96 10*6/uL (ref 4.20–5.40)
RDW: 14 % (ref 12–15)
WBC: 5.95 10*3/uL (ref 3.50–10.80)

## 2011-12-17 LAB — COMPREHENSIVE METABOLIC PANEL
ALT: 13 U/L (ref 0–55)
AST (SGOT): 13 U/L (ref 5–34)
Albumin/Globulin Ratio: 1.1 (ref 0.9–2.2)
Albumin: 3.5 g/dL (ref 3.5–5.0)
Alkaline Phosphatase: 100 U/L (ref 40–150)
Anion Gap: 12 (ref 5.0–15.0)
BUN: 9 mg/dL (ref 7.0–19.0)
Bilirubin, Total: 0.4 mg/dL (ref 0.2–1.2)
CO2: 26 mEq/L (ref 22–29)
Calcium: 9.7 mg/dL (ref 8.5–10.5)
Chloride: 104 mEq/L (ref 98–107)
Creatinine: 0.8 mg/dL (ref 0.6–1.0)
Globulin: 3.3 g/dL (ref 2.0–3.6)
Glucose: 111 mg/dL — ABNORMAL HIGH (ref 70–100)
Potassium: 3.4 mEq/L — ABNORMAL LOW (ref 3.5–5.1)
Protein, Total: 6.8 g/dL (ref 6.0–8.3)
Sodium: 142 mEq/L (ref 136–145)

## 2011-12-17 LAB — HEMOLYSIS INDEX: Hemolysis Index: 7 Index (ref 0–18)

## 2011-12-17 LAB — GFR: EGFR: >60

## 2011-12-18 LAB — ECG 12-LEAD
Atrial Rate: 85 {beats}/min
P Axis: 64 degrees
P-R Interval: 174 ms
Q-T Interval: 374 ms
QRS Duration: 74 ms
QTC Calculation (Bezet): 445 ms
R Axis: -11 degrees
T Axis: 8 degrees
Ventricular Rate: 85 {beats}/min

## 2011-12-23 ENCOUNTER — Emergency Department
Admit: 2011-12-23 | Discharge: 2011-12-23 | Disposition: A | Discharge status: Home / Self Care | Payer: Self-pay | Source: Emergency Department | Admitting: Emergency Medicine

## 2011-12-23 LAB — CBC AND DIFFERENTIAL
Basophils Absolute Automated: 0.02 10*3/uL (ref 0.00–0.20)
Basophils Automated: 0 % (ref 0–2)
Eosinophils Absolute Automated: 0.03 10*3/uL (ref 0.00–0.70)
Eosinophils Automated: 0 % (ref 0–5)
Hematocrit: 45.5 % (ref 37.0–47.0)
Hgb: 15.5 g/dL (ref 12.0–16.0)
Immature Granulocytes Absolute: 0.03 10*3/uL
Immature Granulocytes: 0 % (ref 0–1)
Lymphocytes Absolute Automated: 1.57 10*3/uL (ref 0.50–4.40)
Lymphocytes Automated: 17 % (ref 15–41)
MCH: 28.5 pg (ref 28.0–32.0)
MCHC: 34.1 g/dL (ref 32.0–36.0)
MCV: 83.8 fL (ref 80.0–100.0)
MPV: 9.4 fL (ref 9.4–12.3)
Monocytes Absolute Automated: 0.75 10*3/uL (ref 0.00–1.20)
Monocytes: 8 % (ref 0–11)
Neutrophils Absolute: 6.84 10*3/uL (ref 1.80–8.10)
Neutrophils: 74 % (ref 52–75)
Nucleated RBC: 0 /100 WBC
Platelets: 387 10*3/uL (ref 140–400)
RBC: 5.43 10*6/uL — ABNORMAL HIGH (ref 4.20–5.40)
RDW: 14 % (ref 12–15)
WBC: 9.21 10*3/uL (ref 3.50–10.80)

## 2011-12-23 LAB — COMPREHENSIVE METABOLIC PANEL
ALT: 10 U/L (ref 0–55)
AST (SGOT): 13 U/L (ref 5–34)
Albumin/Globulin Ratio: 1.2 (ref 0.9–2.2)
Albumin: 4.1 g/dL (ref 3.5–5.0)
Alkaline Phosphatase: 108 U/L (ref 40–150)
Anion Gap: 11 (ref 5.0–15.0)
BUN: 13 mg/dL (ref 7.0–19.0)
Bilirubin, Total: 0.4 mg/dL (ref 0.2–1.2)
CO2: 27 mEq/L (ref 22–29)
Calcium: 9.8 mg/dL (ref 8.5–10.5)
Chloride: 102 mEq/L (ref 98–107)
Creatinine: 0.8 mg/dL (ref 0.6–1.0)
Globulin: 3.5 g/dL (ref 2.0–3.6)
Glucose: 109 mg/dL — ABNORMAL HIGH (ref 70–100)
Potassium: 3.5 mEq/L (ref 3.5–5.1)
Protein, Total: 7.6 g/dL (ref 6.0–8.3)
Sodium: 140 mEq/L (ref 136–145)

## 2011-12-23 LAB — APTT: PTT: 33 s (ref 23–37)

## 2011-12-23 LAB — HEMOLYSIS INDEX: Hemolysis Index: 5 Index (ref 0–18)

## 2011-12-23 LAB — PT/INR
PT INR: 1 (ref 0.9–1.1)
PT: 13.3 s (ref 12.6–15.0)

## 2011-12-23 LAB — TROPONIN I: Troponin I: <0.01 ng/mL (ref 0.00–0.09)

## 2011-12-23 LAB — GFR: EGFR: >60

## 2011-12-24 LAB — ECG 12-LEAD
Atrial Rate: 80 {beats}/min
P Axis: 59 degrees
P-R Interval: 166 ms
Q-T Interval: 390 ms
QRS Duration: 84 ms
QTC Calculation (Bezet): 449 ms
R Axis: -8 degrees
T Axis: 7 degrees
Ventricular Rate: 80 {beats}/min

## 2012-01-20 ENCOUNTER — Emergency Department
Admit: 2012-01-20 | Discharge: 2012-01-20 | Disposition: A | Discharge status: Home / Self Care | Payer: Self-pay | Source: Emergency Department | Admitting: Emergency Medicine

## 2012-01-20 LAB — TROPONIN I: Troponin I: <0.01 ng/mL (ref 0.00–0.09)

## 2012-01-20 LAB — ECG 12-LEAD
Atrial Rate: 95 {beats}/min
P Axis: 65 degrees
P-R Interval: 158 ms
Q-T Interval: 364 ms
QRS Duration: 80 ms
QTC Calculation (Bezet): 457 ms
R Axis: -9 degrees
T Axis: 41 degrees
Ventricular Rate: 95 {beats}/min

## 2012-01-20 LAB — GFR: EGFR: >60

## 2012-01-20 LAB — BASIC METABOLIC PANEL
Anion Gap: 11 (ref 5.0–15.0)
BUN: 8 mg/dL (ref 7.0–19.0)
CO2: 27 mEq/L (ref 22–29)
Calcium: 9.8 mg/dL (ref 8.5–10.5)
Chloride: 104 mEq/L (ref 98–107)
Creatinine: 0.8 mg/dL (ref 0.6–1.0)
Glucose: 104 mg/dL — ABNORMAL HIGH (ref 70–100)
Potassium: 3.1 mEq/L — ABNORMAL LOW (ref 3.5–5.1)
Sodium: 142 mEq/L (ref 136–145)

## 2012-01-20 LAB — HEMOLYSIS INDEX
Hemolysis Index: 7 Index (ref 0–18)
Hemolysis Index: 9 Index (ref 0–18)

## 2012-01-20 LAB — CBC
Hematocrit: 41.6 % (ref 37.0–47.0)
Hgb: 14.1 g/dL (ref 12.0–16.0)
MCH: 28.4 pg (ref 28.0–32.0)
MCHC: 33.9 g/dL (ref 32.0–36.0)
MCV: 83.7 fL (ref 80.0–100.0)
MPV: 9 fL — ABNORMAL LOW (ref 9.4–12.3)
Nucleated RBC: 0 /100 WBC
Platelets: 277 10*3/uL (ref 140–400)
RBC: 4.97 10*6/uL (ref 4.20–5.40)
RDW: 14 % (ref 12–15)
WBC: 5.96 10*3/uL (ref 3.50–10.80)

## 2012-01-20 LAB — HEPATIC FUNCTION PANEL
ALT: 10 U/L (ref 0–55)
AST (SGOT): 12 U/L (ref 5–34)
Albumin/Globulin Ratio: 1.1 (ref 0.9–2.2)
Albumin: 3.6 g/dL (ref 3.5–5.0)
Alkaline Phosphatase: 104 U/L (ref 40–150)
Bilirubin Direct: 0.2 mg/dL (ref 0.0–0.5)
Bilirubin Indirect: 0.3 mg/dL (ref 0.0–1.0)
Bilirubin, Total: 0.5 mg/dL (ref 0.2–1.2)
Globulin: 3.2 g/dL (ref 2.0–3.6)
Protein, Total: 6.8 g/dL (ref 6.0–8.3)

## 2012-01-20 LAB — LIPASE: Lipase: 7 U/L — ABNORMAL LOW (ref 8–78)

## 2012-01-25 ENCOUNTER — Emergency Department
Admit: 2012-01-25 | Discharge: 2012-01-25 | Disposition: A | Discharge status: Home / Self Care | Payer: Self-pay | Source: Emergency Department | Admitting: Emergency Medicine

## 2012-01-25 LAB — COMPREHENSIVE METABOLIC PANEL
ALT: 15 U/L (ref 0–55)
AST (SGOT): 15 U/L (ref 5–34)
Albumin/Globulin Ratio: 1.1 (ref 0.9–2.2)
Albumin: 4 g/dL (ref 3.5–5.0)
Alkaline Phosphatase: 117 U/L (ref 40–150)
Anion Gap: 18 — ABNORMAL HIGH (ref 5.0–15.0)
BUN: 11 mg/dL (ref 7.0–19.0)
Bilirubin, Total: 0.4 mg/dL (ref 0.2–1.2)
CO2: 22 mEq/L (ref 22–29)
Calcium: 9.9 mg/dL (ref 8.5–10.5)
Chloride: 100 mEq/L (ref 98–107)
Creatinine: 0.8 mg/dL (ref 0.6–1.0)
Globulin: 3.5 g/dL (ref 2.0–3.6)
Glucose: 92 mg/dL (ref 70–100)
Potassium: 3.2 mEq/L — ABNORMAL LOW (ref 3.5–5.1)
Protein, Total: 7.5 g/dL (ref 6.0–8.3)
Sodium: 140 mEq/L (ref 136–145)

## 2012-01-25 LAB — CBC AND DIFFERENTIAL
Basophils Absolute Automated: 0.01 10*3/uL (ref 0.00–0.20)
Basophils Automated: 0 % (ref 0–2)
Eosinophils Absolute Automated: 0.05 10*3/uL (ref 0.00–0.70)
Eosinophils Automated: 1 % (ref 0–5)
Hematocrit: 44.8 % (ref 37.0–47.0)
Hgb: 15.3 g/dL (ref 12.0–16.0)
Immature Granulocytes Absolute: 0.04 10*3/uL
Immature Granulocytes: 1 % (ref 0–1)
Lymphocytes Absolute Automated: 1.71 10*3/uL (ref 0.50–4.40)
Lymphocytes Automated: 23 % (ref 15–41)
MCH: 28.7 pg (ref 28.0–32.0)
MCHC: 34.2 g/dL (ref 32.0–36.0)
MCV: 84.1 fL (ref 80.0–100.0)
MPV: 9.1 fL — ABNORMAL LOW (ref 9.4–12.3)
Monocytes Absolute Automated: 0.64 10*3/uL (ref 0.00–1.20)
Monocytes: 9 % (ref 0–11)
Neutrophils Absolute: 4.98 10*3/uL (ref 1.80–8.10)
Neutrophils: 67 % (ref 52–75)
Nucleated RBC: 0 /100 WBC
Platelets: 345 10*3/uL (ref 140–400)
RBC: 5.33 10*6/uL (ref 4.20–5.40)
RDW: 14 % (ref 12–15)
WBC: 7.39 10*3/uL (ref 3.50–10.80)

## 2012-01-25 LAB — HEMOLYSIS INDEX: Hemolysis Index: 10 Index (ref 0–18)

## 2012-01-25 LAB — GFR: EGFR: >60

## 2012-01-25 LAB — TROPONIN I: Troponin I: <0.01 ng/mL (ref 0.00–0.09)

## 2012-01-26 LAB — ECG 12-LEAD
Atrial Rate: 81 {beats}/min
P Axis: 60 degrees
P-R Interval: 158 ms
Q-T Interval: 388 ms
QRS Duration: 86 ms
QTC Calculation (Bezet): 450 ms
R Axis: -12 degrees
T Axis: 5 degrees
Ventricular Rate: 81 {beats}/min

## 2012-01-31 ENCOUNTER — Emergency Department
Admit: 2012-01-31 | Discharge: 2012-01-31 | Disposition: A | Discharge status: Home / Self Care | Payer: Self-pay | Source: Emergency Department | Admitting: Emergency Medicine

## 2012-01-31 LAB — CBC AND DIFFERENTIAL
Basophils Absolute Automated: 0.02 10*3/uL (ref 0.00–0.20)
Basophils Automated: 0 % (ref 0–2)
Eosinophils Absolute Automated: 0.02 10*3/uL (ref 0.00–0.70)
Eosinophils Automated: 0 % (ref 0–5)
Hematocrit: 42.5 % (ref 37.0–47.0)
Hgb: 14.6 g/dL (ref 12.0–16.0)
Immature Granulocytes Absolute: 0.03 10*3/uL
Immature Granulocytes: 0 % (ref 0–1)
Lymphocytes Absolute Automated: 1.59 10*3/uL (ref 0.50–4.40)
Lymphocytes Automated: 19 % (ref 15–41)
MCH: 28.6 pg (ref 28.0–32.0)
MCHC: 34.4 g/dL (ref 32.0–36.0)
MCV: 83.3 fL (ref 80.0–100.0)
MPV: 9.3 fL — ABNORMAL LOW (ref 9.4–12.3)
Monocytes Absolute Automated: 0.71 10*3/uL (ref 0.00–1.20)
Monocytes: 8 % (ref 0–11)
Neutrophils Absolute: 6.16 10*3/uL (ref 1.80–8.10)
Neutrophils: 72 % (ref 52–75)
Nucleated RBC: 0 /100 WBC
Platelets: 324 10*3/uL (ref 140–400)
RBC: 5.1 10*6/uL (ref 4.20–5.40)
RDW: 14 % (ref 12–15)
WBC: 8.5 10*3/uL (ref 3.50–10.80)

## 2012-01-31 LAB — BASIC METABOLIC PANEL
Anion Gap: 17 — ABNORMAL HIGH (ref 5.0–15.0)
BUN: 13 mg/dL (ref 7.0–19.0)
CO2: 22 mEq/L (ref 22–29)
Calcium: 9.8 mg/dL (ref 8.5–10.5)
Chloride: 103 mEq/L (ref 98–107)
Creatinine: 0.8 mg/dL (ref 0.6–1.0)
Glucose: 89 mg/dL (ref 70–100)
Potassium: 3 mEq/L — ABNORMAL LOW (ref 3.5–5.1)
Sodium: 142 mEq/L (ref 136–145)

## 2012-01-31 LAB — GFR: EGFR: >60

## 2012-01-31 LAB — TROPONIN I: Troponin I: <0.01 ng/mL (ref 0.00–0.09)

## 2012-01-31 LAB — HEMOLYSIS INDEX: Hemolysis Index: 59 Index — ABNORMAL HIGH (ref 0–18)

## 2012-02-02 LAB — ECG 12-LEAD
Atrial Rate: 92 {beats}/min
P Axis: 67 degrees
P-R Interval: 148 ms
Q-T Interval: 356 ms
QRS Duration: 78 ms
QTC Calculation (Bezet): 440 ms
R Axis: 7 degrees
T Axis: 18 degrees
Ventricular Rate: 92 {beats}/min

## 2012-04-06 ENCOUNTER — Emergency Department
Admit: 2012-04-06 | Discharge: 2012-04-06 | Disposition: A | Discharge status: Home / Self Care | Payer: Self-pay | Source: Emergency Department | Admitting: Emergency Medicine

## 2012-04-06 LAB — COMPREHENSIVE METABOLIC PANEL
ALT: 13 U/L (ref 0–55)
AST (SGOT): 13 U/L (ref 5–34)
Albumin/Globulin Ratio: 1.1 (ref 0.9–2.2)
Albumin: 3.7 g/dL (ref 3.5–5.0)
Alkaline Phosphatase: 104 U/L (ref 40–150)
Anion Gap: 12 (ref 5.0–15.0)
BUN: 17 mg/dL (ref 7.0–19.0)
Bilirubin, Total: 0.4 mg/dL (ref 0.2–1.2)
CO2: 26 mEq/L (ref 22–29)
Calcium: 9.8 mg/dL (ref 8.5–10.5)
Chloride: 103 mEq/L (ref 98–107)
Creatinine: 0.8 mg/dL (ref 0.6–1.0)
Globulin: 3.3 g/dL (ref 2.0–3.6)
Glucose: 115 mg/dL — ABNORMAL HIGH (ref 70–100)
Potassium: 3.8 mEq/L (ref 3.5–5.1)
Protein, Total: 7 g/dL (ref 6.0–8.3)
Sodium: 141 mEq/L (ref 136–145)

## 2012-04-06 LAB — CBC AND DIFFERENTIAL
Basophils Absolute Automated: 0.01 10*3/uL (ref 0.00–0.20)
Basophils Automated: 0 % (ref 0–2)
Eosinophils Absolute Automated: 0.04 10*3/uL (ref 0.00–0.70)
Eosinophils Automated: 0 % (ref 0–5)
Hematocrit: 42.4 % (ref 37.0–47.0)
Hgb: 14 g/dL (ref 12.0–16.0)
Immature Granulocytes Absolute: 0.02 10*3/uL
Immature Granulocytes: 0 % (ref 0–1)
Lymphocytes Absolute Automated: 1.93 10*3/uL (ref 0.50–4.40)
Lymphocytes Automated: 20 % (ref 15–41)
MCH: 28.4 pg (ref 28.0–32.0)
MCHC: 33 g/dL (ref 32.0–36.0)
MCV: 86 fL (ref 80.0–100.0)
MPV: 9.2 fL — ABNORMAL LOW (ref 9.4–12.3)
Monocytes Absolute Automated: 0.68 10*3/uL (ref 0.00–1.20)
Monocytes: 7 % (ref 0–11)
Neutrophils Absolute: 7.04 10*3/uL (ref 1.80–8.10)
Neutrophils: 73 % (ref 52–75)
Nucleated RBC: 0 /100 WBC (ref 0–1)
Platelets: 318 10*3/uL (ref 140–400)
RBC: 4.93 10*6/uL (ref 4.20–5.40)
RDW: 15 % (ref 12–15)
WBC: 9.7 10*3/uL (ref 3.50–10.80)

## 2012-04-06 LAB — ECG 12-LEAD
Atrial Rate: 85 {beats}/min
P Axis: 70 degrees
P-R Interval: 154 ms
Q-T Interval: 356 ms
QRS Duration: 82 ms
QTC Calculation (Bezet): 423 ms
R Axis: -6 degrees
T Axis: 27 degrees
Ventricular Rate: 85 {beats}/min

## 2012-04-06 LAB — GFR: EGFR: >60

## 2012-04-06 LAB — HEMOLYSIS INDEX: Hemolysis Index: 4 Index (ref 0–18)

## 2012-04-06 LAB — LIPASE: Lipase: 17 U/L (ref 8–78)

## 2012-05-15 ENCOUNTER — Emergency Department: Payer: PRIVATE HEALTH INSURANCE

## 2012-05-15 ENCOUNTER — Emergency Department
Admission: EM | Admit: 2012-05-15 | Discharge: 2012-05-15 | Disposition: A | Discharge status: Other Acute Inpatient Hospital | Payer: PRIVATE HEALTH INSURANCE | Attending: Emergency Medicine | Admitting: Emergency Medicine

## 2012-05-15 DIAGNOSIS — R109 Unspecified abdominal pain: Secondary | ICD-10-CM

## 2012-05-15 DIAGNOSIS — R1013 Epigastric pain: Secondary | ICD-10-CM | POA: Insufficient documentation

## 2012-05-15 DIAGNOSIS — K219 Gastro-esophageal reflux disease without esophagitis: Secondary | ICD-10-CM | POA: Insufficient documentation

## 2012-05-15 DIAGNOSIS — I10 Essential (primary) hypertension: Secondary | ICD-10-CM | POA: Insufficient documentation

## 2012-05-15 HISTORY — DX: Essential (primary) hypertension: I10

## 2012-05-15 HISTORY — DX: Gastro-esophageal reflux disease without esophagitis: K21.9

## 2012-05-15 LAB — CBC AND DIFFERENTIAL
Basophils Absolute Automated: 0.01 10*3/uL (ref 0.00–0.20)
Basophils Automated: 0 % (ref 0–2)
Eosinophils Absolute Automated: 0.04 10*3/uL (ref 0.00–0.70)
Eosinophils Automated: 1 % (ref 0–5)
Hematocrit: 42.9 % (ref 37.0–47.0)
Hgb: 14.5 g/dL (ref 12.0–16.0)
Immature Granulocytes Absolute: 0.01 10*3/uL
Immature Granulocytes: 0 % (ref 0–1)
Lymphocytes Absolute Automated: 1.3 10*3/uL (ref 0.50–4.40)
Lymphocytes Automated: 19 % (ref 15–41)
MCH: 28.9 pg (ref 28.0–32.0)
MCHC: 33.8 g/dL (ref 32.0–36.0)
MCV: 85.5 fL (ref 80.0–100.0)
MPV: 8.9 fL — ABNORMAL LOW (ref 9.4–12.3)
Monocytes Absolute Automated: 0.49 10*3/uL (ref 0.00–1.20)
Monocytes: 7 % (ref 0–11)
Neutrophils Absolute: 5.01 10*3/uL (ref 1.80–8.10)
Neutrophils: 73 % (ref 52–75)
Nucleated RBC: 0 /100 WBC (ref 0–1)
Platelets: 297 10*3/uL (ref 140–400)
RBC: 5.02 10*6/uL (ref 4.20–5.40)
RDW: 14 % (ref 12–15)
WBC: 6.85 10*3/uL (ref 3.50–10.80)

## 2012-05-15 LAB — COMPREHENSIVE METABOLIC PANEL
ALT: 12 U/L (ref 0–55)
AST (SGOT): 14 U/L (ref 5–34)
Albumin/Globulin Ratio: 1.1 (ref 0.9–2.2)
Albumin: 3.7 g/dL (ref 3.5–5.0)
Alkaline Phosphatase: 108 U/L (ref 40–150)
Anion Gap: 15 (ref 5.0–15.0)
BUN: 10 mg/dL (ref 7.0–19.0)
Bilirubin, Total: 0.5 mg/dL (ref 0.2–1.2)
CO2: 22 mEq/L (ref 22–29)
Calcium: 9.6 mg/dL (ref 8.5–10.5)
Chloride: 106 mEq/L (ref 98–107)
Creatinine: 0.8 mg/dL (ref 0.6–1.0)
Globulin: 3.5 g/dL (ref 2.0–3.6)
Glucose: 92 mg/dL (ref 70–100)
Potassium: 3.7 mEq/L (ref 3.5–5.1)
Protein, Total: 7.2 g/dL (ref 6.0–8.3)
Sodium: 143 mEq/L (ref 136–145)

## 2012-05-15 LAB — POCT URINALYSIS AUTOMATED (IAH)
Bilirubin, UA POCT: NEGATIVE
Blood, UA POCT: NEGATIVE
Glucose, UA POCT: NEGATIVE
Ketones, UA POCT: NEGATIVE mg/dL
Nitrite, UA POCT: NEGATIVE
PH, UA POCT: 7 (ref 4.6–8)
Protein, UA POCT: NEGATIVE mg/dL
Specific Gravity, UA POCT: 1.01 mg/dL (ref 1.001–1.035)
Urine Leukocytes POCT: NEGATIVE
Urobilinogen, UA POCT: 0.2 mg/dL

## 2012-05-15 LAB — GFR: EGFR: >60

## 2012-05-15 LAB — LIPASE: Lipase: 14 U/L (ref 8–78)

## 2012-05-15 LAB — HEMOLYSIS INDEX: Hemolysis Index: 20 Index — ABNORMAL HIGH (ref 0–18)

## 2012-05-15 LAB — TROPONIN I: Troponin I: <0.01 ng/mL (ref 0.00–0.09)

## 2012-05-15 MED ORDER — LORAZEPAM 2 MG/ML IJ SOLN
1.00 mg | Freq: Once | INTRAMUSCULAR | Status: AC
Start: 2012-05-15 — End: 2012-05-15
  Administered 2012-05-15: 1 mg via INTRAVENOUS
  Filled 2012-05-15: qty 1

## 2012-05-15 MED ORDER — KETOROLAC TROMETHAMINE 30 MG/ML IJ SOLN
10.00 mg | Freq: Once | INTRAMUSCULAR | Status: AC
Start: 2012-05-15 — End: 2012-05-15
  Administered 2012-05-15: 9.9 mg via INTRAVENOUS
  Filled 2012-05-15: qty 1

## 2012-05-15 MED ORDER — ONDANSETRON HCL 4 MG/2ML IJ SOLN
4.00 mg | Freq: Once | INTRAMUSCULAR | Status: AC
Start: 2012-05-15 — End: 2012-05-15
  Administered 2012-05-15: 4 mg via INTRAVENOUS
  Filled 2012-05-15: qty 2

## 2012-05-15 MED ORDER — MORPHINE SULFATE 2 MG/ML IJ/IV SOLN (WRAP)
2.00 mg | Freq: Once | INTRAVENOUS | Status: AC
Start: 2012-05-15 — End: 2012-05-15
  Administered 2012-05-15: 2 mg via INTRAVENOUS
  Filled 2012-05-15: qty 1

## 2012-05-15 MED ORDER — SODIUM CHLORIDE 0.9 % IV BOLUS
1000.00 mL | Freq: Once | INTRAVENOUS | Status: AC
Start: 2012-05-15 — End: 2012-05-15
  Administered 2012-05-15: 1000 mL via INTRAVENOUS

## 2012-05-15 MED ORDER — SODIUM CHLORIDE 0.9 % IV SOLN
500.00 mL | INTRAVENOUS | Status: DC
Start: 2012-05-15 — End: 2012-05-15
  Administered 2012-05-15: 500 mL via INTRAVENOUS

## 2012-05-15 MED ORDER — PANTOPRAZOLE SODIUM 40 MG IV SOLR
40.00 mg | Freq: Once | INTRAVENOUS | Status: AC
Start: 2012-05-15 — End: 2012-05-15
  Administered 2012-05-15: 40 mg via INTRAVENOUS
  Filled 2012-05-15: qty 40

## 2012-05-15 NOTE — ED Provider Notes (Addendum)
EMERGENCY DEPARTMENT HISTORY AND PHYSICAL EXAM    Date: 05/15/2012  Patient Name: Amber [name]  Attending Physician:  Donny Pique, MD  Diagnosis and Treatment Plan       Clinical Impression:   1. Abdominal  pain, other specified site        Treatment Plan:   ED Disposition     Transfer to Another Facility Ferol MAGDALYNN DAVILLA should be transferred out to [facility].            History of Presenting Illness     Chief Complaint   Patient presents with   . Abdominal Pain     epigastric       History Provided By: Patient  Chief Complaint: Abdominal Pain  Onset: 6 months ago  Timing: gradual  Location: epigastric  Quality:   Severity: moderate  Modifying Factors: has been evaluated by Mikael Spray during which thorough evaluation was done  Associated sxs: nausea    Additional History: Amber [name] is a 61 y.o. female who presents to ED with epigastric abd pain that has been intermittent for the last 6 months.  Pt sts pain returned on Tuesday, trying to schedule appt with GI.  Pt has had extensive workups with [facility] including ultrasounds, CT scans, heidoscans with no working dx.  Pt c/o nausea.      PCP: Magbuhos, Celerino M, MD      Current facility-administered medications:0.9%  NaCl infusion, 500 mL, Intravenous, Continuous, Julie-Ann Vanmaanen, Landis Gandy, MD, Last Rate: 125 mL/hr at 05/15/12 1254, 500 mL at 05/15/12 1254;  ketorolac (TORADOL) injection 9.9 mg, 9.9 mg, Intravenous, Once, Lavarr President B, MD, 9.9 mg at 05/15/12 1102;  LORazepam (ATIVAN) injection 1 mg, 1 mg, Intravenous, Once, Jinx Gilden B, MD, 1 mg at 05/15/12 1105  morphine injection 2 mg, 2 mg, Intravenous, Once, Audley Hinojos, Jomarie Longs B, MD, 2 mg at 05/15/12 1212;  ondansetron (ZOFRAN) injection 4 mg, 4 mg, Intravenous, Once, Darlyne Schmiesing, Landis Gandy, MD, 4 mg at 05/15/12 1059;  pantoprazole (PROTONIX) injection 40 mg, 40 mg, Intravenous, Once, Birney Belshe B, MD, 40 mg at 05/15/12 1108;  sodium chloride 0.9 % bolus 1,000 mL, 1,000 mL, Intravenous, Once,  Lakeva Hollon, Landis Gandy, MD, 1,000 mL at 05/15/12 1111  Current outpatient prescriptions:albuterol (PROVENTIL) (2.5 MG/3ML) 0.083% nebulizer solution, Take 2.5 mg by nebulization every 6 (six) hours as needed., Disp: , Rfl: ;  ALPRAZolam (XANAX) 1 MG tablet, Take 1 mg by mouth as needed., Disp: , Rfl: ;  amitriptyline (ELAVIL) 25 MG tablet, Take 25 mg by mouth nightly., Disp: , Rfl: ;  aspirin 81 MG tablet, Take 81 mg by mouth daily., Disp: , Rfl:   azelastine (ASTELIN) 137 MCG/SPRAY nasal spray, 1 spray by Nasal route 2 (two) times daily. Use in each nostril as directed, Disp: , Rfl: ;  beclomethasone (BECONASE-AQ) 42 MCG/SPRAY nasal spray, 2 sprays by Nasal route 2 (two) times daily. Dose is for each nostril. , Disp: , Rfl: ;  dicyclomine (BENTYL) 10 MG capsule, Take 10 mg by mouth 4 times daily - with meals and at bedtime., Disp: , Rfl:   diltiazem (TIAZAC) 120 MG 24 hr capsule, Take 120 mg by mouth daily., Disp: , Rfl: ;  ethacrynic acid (EDECRIN) 25 MG tablet, Take 25 mg by mouth daily., Disp: , Rfl: ;  pantoprazole (PROTONIX) 40 MG tablet, Take 40 mg by mouth daily., Disp: , Rfl: ;  ranitidine (ZANTAC) 300 MG tablet, Take 300 mg by mouth nightly., Disp: , Rfl: ;  sucralfate (CARAFATE) 1 G tablet, Take 1 g by mouth 4 (four) times daily., Disp: , Rfl:   valACYclovir HCL (VALTREX) 500 MG tablet, Take 500 mg by mouth as needed., Disp: , Rfl:     Past Medical History     Past Medical History   Diagnosis Date   . Hypertensive disorder    . GERD (gastroesophageal reflux disease)      History reviewed. No pertinent past surgical history.    Family History     History reviewed. No pertinent family history.    Social History     History     Social History   . Marital Status: Single     Spouse Name: N/A     Number of Children: N/A   . Years of Education: N/A     Social History Main Topics   . Smoking status: Never Smoker    . Smokeless tobacco: Not on file   . Alcohol Use: No   . Drug Use: No   . Sexually Active: Not on  file     Other Topics Concern   . Not on file     Social History Narrative   . No narrative on file       Allergies     No Known Allergies    Review of Systems     Review of Systems   Respiratory: Negative for shortness of breath.    Cardiovascular: Negative for chest pain.   Gastrointestinal: Positive for nausea and abdominal pain.   All other systems reviewed and are negative.          Physical Exam     BP 131/80  Pulse 79  Temp(Src) 97 F (36.1 C) (Oral)  Resp 18  Ht 1.549 m  Wt 106.142 kg  BMI 44.24 kg/m2  SpO2 98%  Pulse Oximetry Analysis - Normal       Physical Examination: General appearance - alert, well appearing, and in no distress  Mental status - alert, oriented to person, place, and time  Eyes - pupils equal and reactive, extraocular eye movements intact  Nose - normal and patent, no discharge  Neck - grossly normal rom  Chest - clear to auscultation, no wheezes, rales or rhonchi, symmetric air entry  Heart - normal rate, regular rhythm  Abdomen - soft,ruq  tender, nondistended  Neurological - maex4, speech clear  Musculoskeletal - no joint tenderness, deformity or swelling  Extremities - distal blood flow grossly normal, no pedal edema  Skin - normal coloration, no rashes where visualized   Psychiatric- alert, oriented, anxious          EKG:  Interpreted by EDP. Sinus rhythm. Rate is normal. Nonspecific ST/Twave changes.  Monitor: Normal sinus rhythm.          Diagnostic Study Results     Labs -     Results     Procedure Component Value Units Date/Time    Lipase [[id number]] Collected:05/15/12 1024    Specimen Information:Blood Updated:05/15/12 1121     Lipase 14 U/L     Troponin I [[id number]] Collected:05/15/12 1024    Specimen Information:Blood Updated:05/15/12 1103     Troponin I <0.01 ng/mL     Comprehensive metabolic panel [[id number]] Collected:05/15/12 1024    Specimen Information:Blood Updated:05/15/12 1056     Glucose 92 mg/dL      BUN 32.2 mg/dL      Creatinine 0.8 mg/dL      Sodium 025  mEq/L  Potassium 3.7 mEq/L      Chloride 106 mEq/L      CO2 22 mEq/L      Calcium 9.6 mg/dL      Protein, Total 7.2 g/dL      Albumin 3.7 g/dL      AST (SGOT) 14 U/L      ALT 12 U/L      Alkaline Phosphatase 108 U/L      Bilirubin, Total 0.5 mg/dL      Globulin 3.5 g/dL      Albumin/Globulin Ratio 1.1      Anion Gap 15.0     HEMOLYZED INDEX [[id number]]  (Abnormal) Collected:05/15/12 1024     Hemolyzed Index 20 (H) Index Updated:05/15/12 1056    GFR [[id number]] Collected:05/15/12 1024     EGFR >60.0 Updated:05/15/12 1056    CBC and differential [[id number]]  (Abnormal) Collected:05/15/12 1024    Specimen Information:Blood Updated:05/15/12 1041     WBC 6.85 x10 3/uL      RBC 5.02 x10 6/uL      Hgb 14.5 g/dL      Hematocrit 88.4 %      MCV 85.5 fL      MCH 28.9 pg      MCHC 33.8 g/dL      RDW 14 %      Platelets 297 x10 3/uL      MPV 8.9 (L) fL      Neutrophils 73 %      Lymphocytes Automated 19 %      Monocytes 7 %      Eosinophils Automated 1 %      Basophils Automated 0 %      Immature Granulocyte 0 %      Nucleated RBC 0 /100 WBC      Neutrophils Absolute 5.01 x10 3/uL      Abs Lymph Automated 1.30 x10 3/uL      Abs Mono Automated 0.49 x10 3/uL      Abs Eos Automated 0.04 x10 3/uL      Absolute Baso Automated 0.01 x10 3/uL      Absolute Immature Granulocyte 0.01 x10 3/uL     POCT UA Clinitek AX (urine dipstick) [[id number]] Collected:05/15/12 1036     Color UA POCT Light Yellow Updated:05/15/12 1037     Clarity UA POCT Clear      Glucose, UA POCT Negative      Bilirubin, UA POCT Negative      Ketones, UA POCT Negative mg/dL      Specific Gravity, UA POCT 1.010 mg/dL      Blood, UA POCT  Negative      PH, UA POCT 7.0      Protein, UA POCT Negative mg/dL      Urobilinogen, UA POCT 0.2 mg/dL      Nitrite, UA POCT Negative      Leukocytes, UA POCT Negative           Radiologic Studies -   Radiology Results (24 Hour)     Procedure Component Value Units Date/Time    CT Abdomen Pelvis WO IV/ WO PO Cont [[id number]]  Collected:05/15/12 1135    Order Status:Completed  Updated:05/15/12 1145    Narrative:    CLINICAL INDICATION: Abdominal pain.     TECHNIQUE: Axial images were obtained from the lung bases through the  proximal femurs without the use of intravenous or oral contrast  material.     Interpretation: Evaluation of the lung bases demonstrates no  abnormalities.  Evaluation of the parenchymal organs is limited by the lack of  intravenous contrast. There is a relatively well circumscribed  low-density lesion within the superior aspect of the liver. This lesion  has density measurements of fluid is most consistent with a hepatic  cyst. The liver is otherwise homogeneous.     The gallbladder, spleen, pancreas and visualized portions of the adrenal  glands are normal in appearance.     There is no hydronephrosis or perinephric fluid. There is a tiny  nonobstructing left renal calculus. There is a small low attenuation  lesion within the lower pole of the right kidney which is too small to  characterize on this unenhanced study but likely represents an  incidental renal cyst.     No lymphadenopathy is present. The abdominal aorta is normal in course  and caliber. The uterus is lobulated in contour suggesting the presence  of fibroids. The adnexa and urinary bladder are unremarkable.     There is no bowel distention or displacement. Portions of the appendix  are identified and appear normal.       Impression:      1. Small nonobstructing left renal calculus.  2. Uterine fibroids. Otherwise unremarkable CT scan of the abdomen and  pelvis.    XR Chest AP Portable [[id number]] Collected:05/15/12 1011    Order Status:Completed  Updated:05/15/12 1015    Narrative:    CLINICAL INDICATION: Pain.     Interpretation: A portable view of the chest was obtained. The heart is  enlarged. Aorta is mildly ectatic. The lungs are clear. There is no  pleural effusion.       Impression:      1. Cardiomegaly. There is no active disease in the  chest.      .    Doctor's Notes     Throughout the stay in the Emergency Department, questions and concerns surrounding pain control, care plans, diagnostic studies, effects of medications administered or prescribed, and future prognostic dilemmas were assessed and addressed.    ROS addendum: The patient and/or family was asked if they had any other complaints or concerns that we could address today and nothing of significance was noted.     9:45AM - Pt seen and evaluated by Dr. Joycelyn Das.    10:18 AM -  Discussed pt case with Mikael Spray MD, who states pt has hx of this chronic abd pain and has been evaluated in the past.  Requests to be called once labs are back.    11:43 AM - Discussed pt case with Mikael Spray MD [facility], who agrees with pt treatment plan and will arrange transfer to [facility].    No asa for pt due to possible need for surgical procedure      _______________________________  Medical DeMedical Decision Makingcision Making  Attestations:     Physician/Midlevel provider first contact with patient: 05/15/12 0945         This note is prepared by Robinette Haines, acting as Scribe for Donny Pique, MD    Donny Pique, MD The scribe's documentation has been prepared under my direction and personally reviewed by me in its entirety.  I confirm that the note above accurately reflects all work, treatment, procedures, and medical decision making performed by me.     I am the first provider for this patient.    I reviewed the vital signs, available nursing notes, past medical history, past surgical history, family history and social history.    _______________________________  Donny Pique, MD  05/15/12 1413    Donny Pique, MD  05/15/12 [phone]

## 2012-05-15 NOTE — ED Notes (Signed)
Abdominal for about 6 months, has had numerous test done. Pt is here today for epigastric pain that started this past Wednesday. Pt c/o of currently nauseated. Pt c/o of pain 10/10, pt said it just hurts unable to describe it

## 2012-05-15 NOTE — ED Notes (Signed)
Report has been called to the [place] at the receiving facility, [facility] and the patient is awaiting ambulance transport (ETA within the hour). The patient is aware of the current plan.

## 2012-05-15 NOTE — ED Notes (Signed)
Dr Joycelyn Das has been notified regarding the patient's continued pain. The patient is currently awaiting bed placement at a [facility] facility and is aware of the current admission plan.

## 2012-05-16 LAB — ECG 12-LEAD
Atrial Rate: 83 {beats}/min
P Axis: 57 degrees
P-R Interval: 164 ms
Q-T Interval: 368 ms
QRS Duration: 78 ms
QTC Calculation (Bezet): 432 ms
R Axis: -11 degrees
T Axis: 19 degrees
Ventricular Rate: 83 {beats}/min

## 2012-07-09 ENCOUNTER — Emergency Department: Payer: PRIVATE HEALTH INSURANCE

## 2012-07-09 ENCOUNTER — Emergency Department
Admission: EM | Admit: 2012-07-09 | Discharge: 2012-07-09 | Disposition: A | Discharge status: Home / Self Care | Payer: PRIVATE HEALTH INSURANCE | Attending: Emergency Medicine | Admitting: Emergency Medicine

## 2012-07-09 DIAGNOSIS — R109 Unspecified abdominal pain: Secondary | ICD-10-CM | POA: Insufficient documentation

## 2012-07-09 DIAGNOSIS — I10 Essential (primary) hypertension: Secondary | ICD-10-CM | POA: Insufficient documentation

## 2012-07-09 DIAGNOSIS — K219 Gastro-esophageal reflux disease without esophagitis: Secondary | ICD-10-CM | POA: Insufficient documentation

## 2012-07-09 LAB — CBC AND DIFFERENTIAL
Basophils Absolute Automated: 0.02 10*3/uL (ref 0.00–0.20)
Basophils Automated: 0 % (ref 0–2)
Eosinophils Absolute Automated: 0.06 10*3/uL (ref 0.00–0.70)
Eosinophils Automated: 1 % (ref 0–5)
Hematocrit: 41.9 % (ref 37.0–47.0)
Hgb: 13.9 g/dL (ref 12.0–16.0)
Immature Granulocytes Absolute: 0.01 10*3/uL
Immature Granulocytes: 0 % (ref 0–1)
Lymphocytes Absolute Automated: 1.03 10*3/uL (ref 0.50–4.40)
Lymphocytes Automated: 20 % (ref 15–41)
MCH: 28.4 pg (ref 28.0–32.0)
MCHC: 33.2 g/dL (ref 32.0–36.0)
MCV: 85.7 fL (ref 80.0–100.0)
MPV: 9.1 fL — ABNORMAL LOW (ref 9.4–12.3)
Monocytes Absolute Automated: 0.41 10*3/uL (ref 0.00–1.20)
Monocytes: 8 % (ref 0–11)
Neutrophils Absolute: 3.56 10*3/uL (ref 1.80–8.10)
Neutrophils: 70 % (ref 52–75)
Nucleated RBC: 0 /100 WBC (ref 0–1)
Platelets: 268 10*3/uL (ref 140–400)
RBC: 4.89 10*6/uL (ref 4.20–5.40)
RDW: 14 % (ref 12–15)
WBC: 5.08 10*3/uL (ref 3.50–10.80)

## 2012-07-09 LAB — COMPREHENSIVE METABOLIC PANEL
ALT: 12 U/L (ref 0–55)
AST (SGOT): 13 U/L (ref 5–34)
Albumin/Globulin Ratio: 1 (ref 0.9–2.2)
Albumin: 3.3 g/dL — ABNORMAL LOW (ref 3.5–5.0)
Alkaline Phosphatase: 101 U/L (ref 40–150)
Anion Gap: 8 (ref 5.0–15.0)
BUN: 10 mg/dL (ref 7.0–19.0)
Bilirubin, Total: 0.4 mg/dL (ref 0.2–1.2)
CO2: 26 mEq/L (ref 22–29)
Calcium: 9.4 mg/dL (ref 8.5–10.5)
Chloride: 108 mEq/L — ABNORMAL HIGH (ref 98–107)
Creatinine: 0.8 mg/dL (ref 0.6–1.0)
Globulin: 3.4 g/dL (ref 2.0–3.6)
Glucose: 96 mg/dL (ref 70–100)
Potassium: 4.1 mEq/L (ref 3.5–5.1)
Protein, Total: 6.7 g/dL (ref 6.0–8.3)
Sodium: 142 mEq/L (ref 136–145)

## 2012-07-09 LAB — URINALYSIS, REFLEX TO MICROSCOPIC EXAM IF INDICATED
Bilirubin, UA: NEGATIVE
Blood, UA: NEGATIVE
Glucose, UA: NEGATIVE
Ketones UA: NEGATIVE
Leukocyte Esterase, UA: NEGATIVE
Nitrite, UA: NEGATIVE
Protein, UR: NEGATIVE
Specific Gravity UA: 1.018 (ref 1.001–1.035)
Urine pH: 7 (ref 5.0–8.0)
Urobilinogen, UA: 2 mg/dL

## 2012-07-09 LAB — LIPASE: Lipase: 11 U/L (ref 8–78)

## 2012-07-09 LAB — HEMOLYSIS INDEX: Hemolysis Index: 9 Index (ref 0–18)

## 2012-07-09 LAB — GFR: EGFR: >60

## 2012-07-09 MED ORDER — HYDROMORPHONE HCL PF 1 MG/ML IJ SOLN
1.00 mg | Freq: Once | INTRAMUSCULAR | Status: AC
Start: 2012-07-09 — End: 2012-07-09
  Administered 2012-07-09: 1 mg via INTRAVENOUS
  Filled 2012-07-09: qty 1

## 2012-07-09 MED ORDER — ONDANSETRON HCL 4 MG/2ML IJ SOLN
4.00 mg | Freq: Once | INTRAMUSCULAR | Status: AC
Start: 2012-07-09 — End: 2012-07-09
  Administered 2012-07-09: 4 mg via INTRAVENOUS
  Filled 2012-07-09: qty 2

## 2012-07-09 MED ORDER — MORPHINE SULFATE 2 MG/ML IJ/IV SOLN (WRAP)
4.00 mg | Freq: Once | INTRAVENOUS | Status: AC
Start: 2012-07-09 — End: 2012-07-09
  Administered 2012-07-09: 4 mg via INTRAVENOUS
  Filled 2012-07-09: qty 2

## 2012-07-09 MED ORDER — SODIUM CHLORIDE 0.9 % IV BOLUS
1000.00 mL | Freq: Once | INTRAVENOUS | Status: AC
Start: 2012-07-09 — End: 2012-07-09
  Administered 2012-07-09: 1000 mL via INTRAVENOUS

## 2012-07-09 NOTE — ED Provider Notes (Signed)
None        History     Chief Complaint   Patient presents with   . Abdominal Pain     HPI Comments: Epigastric pain x weeks.  She came today because she is "somebody needs to figure out what's causing this pain."  She has been seen by her PCP and gastroenterologist within last few days.  Endoscopy is planned for 3 days from now.  Has not recently had a sonogram.  No blood in stool, tarry black stools, fever, vomiting.  No chest pain.     Patient is a 61 y.o. female presenting with abdominal pain. The history is provided by the patient. No language interpreter was used.   Abdominal Pain  The primary symptoms of the illness include abdominal pain and nausea. The primary symptoms of the illness do not include vomiting, diarrhea or dysuria.   Additional symptoms associated with the illness include constipation. Symptoms associated with the illness do not include frequency.       Past Medical History   Diagnosis Date   . Hypertensive disorder    . GERD (gastroesophageal reflux disease)        History reviewed. No pertinent past surgical history.    History reviewed. No pertinent family history.    Social  History   Substance Use Topics   . Smoking status: Never Smoker    . Smokeless tobacco: Not on file   . Alcohol Use: No       .     No Known Allergies    Current/Home Medications    ALBUTEROL (PROVENTIL) (2.5 MG/3ML) 0.083% NEBULIZER SOLUTION    Take 2.5 mg by nebulization every 6 (six) hours as needed.    ALPRAZOLAM (XANAX) 1 MG TABLET    Take 1 mg by mouth as needed.    AMITRIPTYLINE (ELAVIL) 25 MG TABLET    Take 25 mg by mouth nightly.    ASPIRIN 81 MG TABLET    Take 81 mg by mouth daily.    AZELASTINE (ASTELIN) 137 MCG/SPRAY NASAL SPRAY    1 spray by Nasal route 2 (two) times daily. Use in each nostril as directed    BECLOMETHASONE (BECONASE-AQ) 42 MCG/SPRAY NASAL SPRAY    2 sprays by Nasal route 2 (two) times daily. Dose is for each nostril.    DICYCLOMINE (BENTYL) 10 MG CAPSULE    Take 10 mg by mouth 4  times daily - with meals and at bedtime.    DILTIAZEM (TIAZAC) 120 MG 24 HR CAPSULE    Take 120 mg by mouth daily.    ETHACRYNIC ACID (EDECRIN) 25 MG TABLET    Take 25 mg by mouth daily.    PANTOPRAZOLE (PROTONIX) 40 MG TABLET    Take 40 mg by mouth daily.    RANITIDINE (ZANTAC) 300 MG TABLET    Take 300 mg by mouth nightly.    SUCRALFATE (CARAFATE) 1 G TABLET    Take 1 g by mouth 4 (four) times daily.    VALACYCLOVIR HCL (VALTREX) 500 MG TABLET    Take 500 mg by mouth as needed.        Review of Systems   Constitutional: Negative.    HENT: Negative.    Eyes: Negative.    Respiratory: Negative.    Cardiovascular: Negative.    Gastrointestinal: Positive for nausea, abdominal pain and constipation. Negative for vomiting, diarrhea, blood in stool, abdominal distention, anal bleeding and rectal pain.   Genitourinary: Negative for dysuria  and frequency.   Musculoskeletal: Negative.    Neurological: Negative.    Hematological: Negative.    Psychiatric/Behavioral: Negative.        Physical Exam    BP 174/95  Pulse 106  Temp 96.3 F (35.7 C)  Resp 20  Ht 1.549 m  Wt 107.956 kg  BMI 44.99 kg/m2  SpO2 99%    Physical Exam   Constitutional: She appears well-developed and well-nourished. No distress.   HENT:   Head: Normocephalic and atraumatic.   Eyes: Conjunctivae normal are normal. Right eye exhibits no discharge. Left eye exhibits no discharge.   Neck: Normal range of motion. Neck supple.   Cardiovascular: Normal rate and regular rhythm.    Pulmonary/Chest: Effort normal and breath sounds normal. No respiratory distress. She has no wheezes. She has no rales. She exhibits no tenderness.   Abdominal: Soft. She exhibits no distension. There is no rebound.        TTP in epigastric area more than RUQ without rebound or guarding.  ? Murphy's sign.    Lymphadenopathy:     She has no cervical adenopathy.   Neurological: She is alert. Coordination normal.   Skin: Skin is warm and dry. No rash noted. She is not diaphoretic.  No erythema. No pallor.   Psychiatric: She has a normal mood and affect. Thought content normal.       MDM and ED Course     ED Medication Orders      Start     Status Ordering Provider    07/09/12 0915   sodium chloride 0.9 % bolus 1,000 mL   Once      Route: Intravenous  Ordered Dose: 1,000 mL         Ordered Avanell Banwart                 MDMDespite treatment with morphine and dilaudid the patient continues with pain.  She says this is not pain she can "live with" at home and wants further evaluation and/or treatment.       Procedures    Clinical Impression & Disposition     Clinical Impression  Final diagnoses:   None        ED Disposition     None           New Prescriptions    No medications on file               Lars Pinks, NP  07/09/12 5409    Lars Pinks, NP  07/09/12 1445

## 2012-07-09 NOTE — ED Notes (Signed)
Bed:[facility]3<BR> Expected date:<BR> Expected time:<BR> Means of arrival:<BR> Comments:<BR>

## 2012-07-09 NOTE — ED Provider Notes (Signed)
Plan of care discussed with mid-level provider, and I agree with the note and plan of care.      Nicholes Rough, MD  07/09/12 [phone]

## 2012-07-09 NOTE — ED Notes (Signed)
Intermittently severe upper AP for a number of months.  Seen by GI yesterday.  No diagnosis made.  Pt. Is to undergo endoscopy with [facility].  States is not able to tolerate excruciating pain.  Unable to eat and drink properly.

## 2012-09-01 ENCOUNTER — Emergency Department: Payer: PRIVATE HEALTH INSURANCE

## 2012-09-01 ENCOUNTER — Emergency Department
Admission: EM | Admit: 2012-09-01 | Discharge: 2012-09-01 | Disposition: A | Discharge status: Home / Self Care | Payer: PRIVATE HEALTH INSURANCE | Attending: Emergency Medicine | Admitting: Emergency Medicine

## 2012-09-01 DIAGNOSIS — K449 Diaphragmatic hernia without obstruction or gangrene: Secondary | ICD-10-CM | POA: Insufficient documentation

## 2012-09-01 DIAGNOSIS — I10 Essential (primary) hypertension: Secondary | ICD-10-CM | POA: Insufficient documentation

## 2012-09-01 DIAGNOSIS — K219 Gastro-esophageal reflux disease without esophagitis: Secondary | ICD-10-CM | POA: Insufficient documentation

## 2012-09-01 DIAGNOSIS — R079 Chest pain, unspecified: Secondary | ICD-10-CM | POA: Insufficient documentation

## 2012-09-01 HISTORY — DX: Diaphragmatic hernia without obstruction or gangrene: K44.9

## 2012-09-01 LAB — CBC AND DIFFERENTIAL
Basophils Absolute Automated: 0.01 10*3/uL (ref 0.00–0.20)
Basophils Automated: 0 % (ref 0–2)
Eosinophils Absolute Automated: 0.08 10*3/uL (ref 0.00–0.70)
Eosinophils Automated: 1 % (ref 0–5)
Hematocrit: 41.7 % (ref 37.0–47.0)
Hgb: 13.9 g/dL (ref 12.0–16.0)
Immature Granulocytes Absolute: 0.01 10*3/uL
Immature Granulocytes: 0 % (ref 0–1)
Lymphocytes Absolute Automated: 1.86 10*3/uL (ref 0.50–4.40)
Lymphocytes Automated: 32 % (ref 15–41)
MCH: 28.3 pg (ref 28.0–32.0)
MCHC: 33.3 g/dL (ref 32.0–36.0)
MCV: 84.9 fL (ref 80.0–100.0)
MPV: 9.6 fL (ref 9.4–12.3)
Monocytes Absolute Automated: 0.57 10*3/uL (ref 0.00–1.20)
Monocytes: 10 % (ref 0–11)
Neutrophils Absolute: 3.21 10*3/uL (ref 1.80–8.10)
Neutrophils: 56 % (ref 52–75)
Nucleated RBC: 0 /100 WBC (ref 0–1)
Platelets: 270 10*3/uL (ref 140–400)
RBC: 4.91 10*6/uL (ref 4.20–5.40)
RDW: 14 % (ref 12–15)
WBC: 5.73 10*3/uL (ref 3.50–10.80)

## 2012-09-01 LAB — ECG 12-LEAD
Atrial Rate: 89 {beats}/min
P Axis: 61 degrees
P-R Interval: 182 ms
Q-T Interval: 364 ms
QRS Duration: 82 ms
QTC Calculation (Bezet): 442 ms
R Axis: -13 degrees
T Axis: 11 degrees
Ventricular Rate: 89 {beats}/min

## 2012-09-01 LAB — COMPREHENSIVE METABOLIC PANEL
ALT: 13 U/L (ref 0–55)
AST (SGOT): 16 U/L (ref 5–34)
Albumin/Globulin Ratio: 1.2 (ref 0.9–2.2)
Albumin: 3.7 g/dL (ref 3.5–5.0)
Alkaline Phosphatase: 98 U/L (ref 40–150)
Anion Gap: 12 (ref 5.0–15.0)
BUN: 9 mg/dL (ref 7.0–19.0)
Bilirubin, Total: 0.4 mg/dL (ref 0.2–1.2)
CO2: 23 mEq/L (ref 22–29)
Calcium: 10 mg/dL (ref 8.5–10.5)
Chloride: 108 mEq/L — ABNORMAL HIGH (ref 98–107)
Creatinine: 0.8 mg/dL (ref 0.6–1.0)
Globulin: 3.1 g/dL (ref 2.0–3.6)
Glucose: 94 mg/dL (ref 70–100)
Potassium: 3.7 mEq/L (ref 3.5–5.1)
Protein, Total: 6.8 g/dL (ref 6.0–8.3)
Sodium: 143 mEq/L (ref 136–145)

## 2012-09-01 LAB — HEMOLYSIS INDEX: Hemolysis Index: 20 Index — ABNORMAL HIGH (ref 0–18)

## 2012-09-01 LAB — LIPASE: Lipase: 10 U/L (ref 8–78)

## 2012-09-01 LAB — GFR: EGFR: >60

## 2012-09-01 LAB — TROPONIN I: Troponin I: 0.01 ng/mL (ref 0.00–0.09)

## 2012-09-01 MED ORDER — ASPIRIN 81 MG PO CHEW
324.00 mg | CHEWABLE_TABLET | Freq: Once | ORAL | Status: AC
Start: 2012-09-01 — End: 2012-09-01
  Administered 2012-09-01: 324 mg via ORAL
  Filled 2012-09-01: qty 4

## 2012-09-01 MED ORDER — ALUM & MAG HYDROXIDE-SIMETH 200-200-20 MG/5ML PO SUSP
30.00 mL | Freq: Once | ORAL | Status: AC
Start: 2012-09-01 — End: 2012-09-01
  Administered 2012-09-01: 30 mL via ORAL
  Filled 2012-09-01: qty 30

## 2012-09-01 MED ORDER — ONDANSETRON HCL 4 MG/2ML IJ SOLN
4.00 mg | Freq: Once | INTRAMUSCULAR | Status: AC
Start: 2012-09-01 — End: 2012-09-01
  Administered 2012-09-01: 4 mg via INTRAVENOUS
  Filled 2012-09-01: qty 2

## 2012-09-01 MED ORDER — NITROGLYCERIN 0.4 MG SL SUBL
0.40 mg | SUBLINGUAL_TABLET | Freq: Once | SUBLINGUAL | Status: AC
Start: 2012-09-01 — End: 2012-09-01
  Administered 2012-09-01: 0.4 mg via SUBLINGUAL
  Filled 2012-09-01: qty 1

## 2012-09-01 MED ORDER — MORPHINE SULFATE 2 MG/ML IJ/IV SOLN (WRAP)
4.00 mg | Freq: Once | INTRAVENOUS | Status: AC
Start: 2012-09-01 — End: 2012-09-01
  Administered 2012-09-01: 4 mg via INTRAVENOUS
  Filled 2012-09-01: qty 2

## 2012-09-01 MED ORDER — LIDOCAINE VISCOUS 2 % MT SOLN
10.00 mL | Freq: Once | OROMUCOSAL | Status: AC
Start: 2012-09-01 — End: 2012-09-01
  Administered 2012-09-01: 10 mL via OROMUCOSAL
  Filled 2012-09-01: qty 15

## 2012-09-01 NOTE — ED Provider Notes (Signed)
EMERGENCY DEPARTMENT HISTORY AND PHYSICAL EXAM    Date: 09/01/2012   Physician/Midlevel provider first contact with patient: 09/01/12 4132       Patient Name: Amber [name]  Attending Physician: Jethro Bastos, MD  Mid-level: Tula Nakayama, PA-C      History of Presenting Illness     Chief Complaint   Patient presents with   . Chest Pain   . Cough       History Provided By: patient    Chief Complaint: chest pain     Amber [name] is a 61 y.o. female presents by EMS complaining of chest pain that began approx 1.5 hours ago.  Pt states she woke from her sleep with the pain.  Pt states earlier in the night she was having some nausea and epigastric pain.  Pt states she felt some SOB with the pain.  Pt states she was able to get to sleep with resolution of the pain, then woke with the mid chest pain. Pt states she experienced a fats hear rate with palpitations at the same time of the chest pain.  Pt states she has had a cough for the past month with mild yellow sputum production.  Pt states she had been taking Ceftin but stopped it early due to nausea.  Pt denies any fevers or chills but states she "just hasnt felt well recently."  Pt denies any headache, dizziness, weakness, or loss of function.    PCP: Magbuhos, Rushie Chestnut, MD    Current Facility-Administered Medications   Medication Dose Route Frequency Provider Last Rate Last Dose   . [COMPLETED] Alum-Mag Hydrox-Simeth (MAALOX PLUS) 200-200-20 MG/5ML suspension 30 mL  30 mL Oral Once Rontavious Albright, Clifton Custard, PA   30 mL at 09/01/12 0316   . [COMPLETED] aspirin chewable tablet 324 mg  324 mg Oral Once Tula Nakayama, PA   324 mg at 09/01/12 0258   . [COMPLETED] lidocaine (XYLOCAINE) 2 % viscous mouth solution 10 mL  10 mL Mouth/Throat Once Traye Bates, Clifton Custard, PA   10 mL at 09/01/12 0316   . [COMPLETED] morphine injection 4 mg  4 mg Intravenous Once Dinesh Ulysse, Clifton Custard, PA   4 mg at 09/01/12 0356   . [COMPLETED] nitroglycerin (NITROSTAT) SL tablet 0.4 mg  0.4 mg Sublingual Once Oley Lahaie, Clifton Custard, PA    0.4 mg at 09/01/12 0259   . [COMPLETED] ondansetron (ZOFRAN) injection 4 mg  4 mg Intravenous Once Tula Nakayama, PA   4 mg at 09/01/12 0354     Current Outpatient Prescriptions   Medication Sig Dispense Refill   . diltiazem (TIAZAC) 120 MG 24 hr capsule Take 120 mg by mouth daily.       Marland Kitchen ethacrynic acid (EDECRIN) 25 MG tablet Take 25 mg by mouth daily.       . pantoprazole (PROTONIX) 40 MG tablet Take 40 mg by mouth daily.       . valACYclovir HCL (VALTREX) 500 MG tablet Take 500 mg by mouth as needed.       . [DISCONTINUED] albuterol (PROVENTIL) (2.5 MG/3ML) 0.083% nebulizer solution Take 2.5 mg by nebulization every 6 (six) hours as needed.       . [DISCONTINUED] ALPRAZolam (XANAX) 1 MG tablet Take 1 mg by mouth as needed.       . [DISCONTINUED] amitriptyline (ELAVIL) 25 MG tablet Take 25 mg by mouth nightly.       . [DISCONTINUED] aspirin 81 MG tablet Take 81 mg by mouth daily.       . [  DISCONTINUED] azelastine (ASTELIN) 137 MCG/SPRAY nasal spray 1 spray by Nasal route 2 (two) times daily. Use in each nostril as directed       . [DISCONTINUED] beclomethasone (BECONASE-AQ) 42 MCG/SPRAY nasal spray 2 sprays by Nasal route 2 (two) times daily. Dose is for each nostril.       . [DISCONTINUED] dicyclomine (BENTYL) 10 MG capsule Take 10 mg by mouth 4 times daily - with meals and at bedtime.       . [DISCONTINUED] ranitidine (ZANTAC) 300 MG tablet Take 300 mg by mouth nightly.       . [DISCONTINUED] sucralfate (CARAFATE) 1 G tablet Take 1 g by mouth 4 (four) times daily.           Past Medical History     Past Medical History   Diagnosis Date   . Hypertensive disorder    . GERD (gastroesophageal reflux disease)    . Hiatal hernia      History reviewed. No pertinent past surgical history.    Family History     History reviewed. No pertinent family history.    Social History     History     Social History   . Marital Status: Single     Spouse Name: N/A     Number of Children: N/A   . Years of Education: N/A      Social History Main Topics   . Smoking status: Never Smoker    . Smokeless tobacco: Not on file   . Alcohol Use: No   . Drug Use: No   . Sexually Active: Not on file     Other Topics Concern   . Not on file     Social History Narrative   . No narrative on file         Allergies     No Known Allergies    Review of Systems     Review of Systems   Constitutional: Negative for chills and diaphoresis.   HENT: Negative for neck pain.    Respiratory: Positive for cough. Negative for shortness of breath.    Cardiovascular: Positive for chest pain and palpitations.   Gastrointestinal: Positive for nausea. Negative for vomiting, abdominal pain, diarrhea, constipation and blood in stool.   Genitourinary: Negative for dysuria.   Musculoskeletal: Negative for back pain and joint pain.   Skin: Negative for rash.   Neurological: Negative for dizziness, tingling, speech change, focal weakness, weakness and headaches.   Endo/Heme/Allergies: Does not bruise/bleed easily.   All other systems reviewed and are negative.          Physical Exam   BP 149/82  Pulse 88  Temp 97 F (36.1 C)  Resp 18  SpO2 100%    CONSTITUTIONAL:  Oriented to person, place, and time.  Well-developed and well-nourished.  Mild distress. Non-toxic appearance.  Vital signs reviewed.  NECK:  Full active and passive range of motion without pain.  Neck supple. No spinous process tenderness and no muscular tenderness present.  No rigidity.  No lymphadenopathy.  RESP:  No respiratory distress.  Breath sounds with no rales, wheezing, or rhonchi. No chest wall tenderness.  CARDIAC:  Regular rate and rhythm.  Heart sounds with no murmurs, gallop, or friction rub.  ABD:  Normal bowel sounds x4 quadrants.  Soft.  Nontender.  Nondistended.  No organomegaly.  No peritoneal signs.  No signs of trauma or contusions.  BACK:  No CVA tenderness.  Full range of motion without  pain.  No spinous process tenderness, muscular tenderness, or spasm present.  Negative bilateral  straight leg raise.  Negative bilateral sciatic notch tenderness.  MS:  No lacerations, contusions, or deformity.  Full active and passive range of motion. 2+ pulses.  No edema or calf tenderness.   SKIN:  Skin is warm and dry.  No rashes, lesions, contusions, or drainage.  NEURO:  A0X3.  Sensory intact throughout  PSYCH:  Mood, memory, affect, and judgment normal for age.      Diagnostic Study Results     Labs -   EKG: normal sinus rhythm.    Results     Procedure Component Value Units Date/Time    Troponin I [[id number]] Collected:09/01/12 0303    Specimen Information:Blood Updated:09/01/12 0353     Troponin I 0.01 ng/mL     Comprehensive Metabolic Panel (CMP) [[id number]]  (Abnormal) Collected:09/01/12 0303    Specimen Information:Blood Updated:09/01/12 0347     Glucose 94 mg/dL      BUN 9.0 mg/dL      Creatinine 0.8 mg/dL      Sodium 629 mEq/L      Potassium 3.7 mEq/L      Chloride 108 (H) mEq/L      CO2 23 mEq/L      Calcium 10.0 mg/dL      Protein, Total 6.8 g/dL      Albumin 3.7 g/dL      AST (SGOT) 16 U/L      ALT 13 U/L      Alkaline Phosphatase 98 U/L      Bilirubin, Total 0.4 mg/dL      Globulin 3.1 g/dL      Albumin/Globulin Ratio 1.2      Anion Gap 12.0     Lipase [[id number]] Collected:09/01/12 0303    Specimen Information:Blood Updated:09/01/12 0347     Lipase 10 U/L     HEMOLYZED INDEX [[id number]]  (Abnormal) Collected:09/01/12 0303     Hemolyzed Index 20 (H) Index Updated:09/01/12 0347    GFR [[id number]] Collected:09/01/12 0303     EGFR >60.0 Updated:09/01/12 0347    CBC and differential [[id number]] Collected:09/01/12 0303    Specimen Information:Blood Updated:09/01/12 0333     WBC 5.73 x10 3/uL      RBC 4.91 x10 6/uL      Hgb 13.9 g/dL      Hematocrit 43.3 %      MCV 84.9 fL      MCH 28.3 pg      MCHC 33.3 g/dL      RDW 14 %      Platelets 270 x10 3/uL      MPV 9.6 fL      Neutrophils 56 %      Lymphocytes Automated 32 %      Monocytes 10 %      Eosinophils Automated 1 %      Basophils Automated 0 %       Immature Granulocyte 0 %      Nucleated RBC 0 /100 WBC      Neutrophils Absolute 3.21 x10 3/uL      Abs Lymph Automated 1.86 x10 3/uL      Abs Mono Automated 0.57 x10 3/uL      Abs Eos Automated 0.08 x10 3/uL      Absolute Baso Automated 0.01 x10 3/uL      Absolute Immature Granulocyte 0.01 x10 3/uL           Radiologic Studies -   Radiology  Results (24 Hour)     Procedure Component Value Units Date/Time    Chest AP Portable [[id number]]     Order Status:Sent  Updated:09/01/12 0303      Chest AP Portable - prelim - no acute process    Clinical Course in the Emergency Department     2:45 AM  Labs EKG, CXR, NTG SL, and ASA 324mg  PO ordered.    3:11 AM  Pt with no change in chest burning after NTG.  Maalox 30cc PO and Viscous lidocaine 10cc PO ordered.  Pt states she was seen by her Card at [facility] this summer and believes she had a CTA done that was "normal."  Will call Mikael Spray to review chart when ED results completed.    3:43 AM  Pt states no relief of burning pain after medications.  Morphine 4mg  IV and Zofran 4mg  IV ordered.    4:21 AM  Call requested to [facility].    4:39 AM  Dr Baldwin Jamaica called back from [place] and reviewed.  Will call over to [facility] for placement.    5:04 AM  Notified by RN that pt accepted at [facility] Obs unit by Dr Hansel Feinstein.  Reviewed transfer plan with pt and family.  Transfer form completed and given to nurse.      Medical Decision Making     Differential diagnosis considered in this case:  MI, Angina, GERD, Hiatal hernia, costochondritis    I reviewed the vital signs, available nursing notes, past medical history, past surgical history, family history and social history.    Vital Signs-Reviewed the patient's vital signs.     Patient Vitals for the past 12 hrs:   BP Temp Pulse Resp   09/01/12 0301 149/82 mmHg 97 F (36.1 C) 88  18    09/01/12 0258 171/103 mmHg - 83  18          Diagnosis and Treatment Plan       Clinical Impression:   1. Chest pain    2. HTN (hypertension)             Attending's Note (MLP) Attestestation           Tula Nakayama, [place]  09/01/12 0510

## 2012-09-01 NOTE — ED Notes (Addendum)
Amber [name] is a 61 y.o. female brought in by EMS for left chest discomfort  described as burning starting appx 0100 with left arm numbness and right neck pain. Pt has had a productive cough for 1 month. Was treated with ceftin for sinus infection but stopped taking medication because of side effects prior to completion.

## 2012-09-01 NOTE — ED Notes (Signed)
Bed:GR11<BR> Expected date:<BR> Expected time:<BR> Means of arrival:<BR> Comments:<BR> Medic 208

## 2012-09-01 NOTE — ED Provider Notes (Signed)
The pt was seen by the midlevel provider.  I discussed the treatment and the plan of care with the midlevel provider.  I agree with the treatment plan and assessment.      Jethro Bastos, MD  09/01/12 [phone]

## 2012-10-19 ENCOUNTER — Emergency Department: Payer: PRIVATE HEALTH INSURANCE

## 2012-10-19 ENCOUNTER — Emergency Department
Admission: EM | Admit: 2012-10-19 | Discharge: 2012-10-19 | Disposition: A | Discharge status: Home / Self Care | Payer: PRIVATE HEALTH INSURANCE | Attending: Emergency Medicine | Admitting: Emergency Medicine

## 2012-10-19 DIAGNOSIS — R1110 Vomiting, unspecified: Secondary | ICD-10-CM | POA: Insufficient documentation

## 2012-10-19 DIAGNOSIS — I10 Essential (primary) hypertension: Secondary | ICD-10-CM | POA: Insufficient documentation

## 2012-10-19 DIAGNOSIS — K219 Gastro-esophageal reflux disease without esophagitis: Secondary | ICD-10-CM | POA: Insufficient documentation

## 2012-10-19 DIAGNOSIS — R109 Unspecified abdominal pain: Secondary | ICD-10-CM | POA: Insufficient documentation

## 2012-10-19 LAB — CBC AND DIFFERENTIAL
Basophils Absolute Automated: 0.02 10*3/uL (ref 0.00–0.20)
Basophils Automated: 0 % (ref 0–2)
Eosinophils Absolute Automated: 0.03 10*3/uL (ref 0.00–0.70)
Eosinophils Automated: 1 % (ref 0–5)
Hematocrit: 45.1 % (ref 37.0–47.0)
Hgb: 14.9 g/dL (ref 12.0–16.0)
Immature Granulocytes Absolute: 0.01 10*3/uL
Immature Granulocytes: 0 % (ref 0–1)
Lymphocytes Absolute Automated: 1.55 10*3/uL (ref 0.50–4.40)
Lymphocytes Automated: 29 % (ref 15–41)
MCH: 28.3 pg (ref 28.0–32.0)
MCHC: 33 g/dL (ref 32.0–36.0)
MCV: 85.7 fL (ref 80.0–100.0)
MPV: 9.5 fL (ref 9.4–12.3)
Monocytes Absolute Automated: 0.47 10*3/uL (ref 0.00–1.20)
Monocytes: 9 % (ref 0–11)
Neutrophils Absolute: 3.36 10*3/uL (ref 1.80–8.10)
Neutrophils: 62 % (ref 52–75)
Nucleated RBC: 0 /100 WBC (ref 0–1)
Platelets: 285 10*3/uL (ref 140–400)
RBC: 5.26 10*6/uL (ref 4.20–5.40)
RDW: 14 % (ref 12–15)
WBC: 5.43 10*3/uL (ref 3.50–10.80)

## 2012-10-19 LAB — ECG 12-LEAD
Atrial Rate: 84 {beats}/min
P Axis: 72 degrees
P-R Interval: 156 ms
Q-T Interval: 362 ms
QRS Duration: 74 ms
QTC Calculation (Bezet): 427 ms
R Axis: -7 degrees
T Axis: 22 degrees
Ventricular Rate: 84 {beats}/min

## 2012-10-19 LAB — COMPREHENSIVE METABOLIC PANEL
ALT: 13 U/L (ref 0–55)
AST (SGOT): 12 U/L (ref 5–34)
Albumin/Globulin Ratio: 1.2 (ref 0.9–2.2)
Albumin: 3.8 g/dL (ref 3.5–5.0)
Alkaline Phosphatase: 103 U/L (ref 40–150)
Anion Gap: 14 (ref 5.0–15.0)
BUN: 8 mg/dL (ref 7.0–19.0)
Bilirubin, Total: 0.6 mg/dL (ref 0.2–1.2)
CO2: 21 mEq/L — ABNORMAL LOW (ref 22–29)
Calcium: 10.1 mg/dL (ref 8.5–10.5)
Chloride: 107 mEq/L (ref 98–107)
Creatinine: 0.9 mg/dL (ref 0.6–1.0)
Globulin: 3.1 g/dL (ref 2.0–3.6)
Glucose: 103 mg/dL — ABNORMAL HIGH (ref 70–100)
Potassium: 3.8 mEq/L (ref 3.5–5.1)
Protein, Total: 6.9 g/dL (ref 6.0–8.3)
Sodium: 142 mEq/L (ref 136–145)

## 2012-10-19 LAB — GFR: EGFR: >60

## 2012-10-19 LAB — TROPONIN I: Troponin I: <0.01 ng/mL (ref 0.00–0.09)

## 2012-10-19 LAB — HEMOLYSIS INDEX: Hemolysis Index: 10 Index (ref 0–18)

## 2012-10-19 LAB — LIPASE: Lipase: 6 U/L — ABNORMAL LOW (ref 8–78)

## 2012-10-19 MED ORDER — HYDROMORPHONE HCL PF 1 MG/ML IJ SOLN
1.00 mg | Freq: Once | INTRAMUSCULAR | Status: AC
Start: 2012-10-19 — End: 2012-10-19
  Administered 2012-10-19: 1 mg via INTRAVENOUS
  Filled 2012-10-19: qty 1

## 2012-10-19 MED ORDER — PANTOPRAZOLE SODIUM 40 MG IV SOLR
INTRAVENOUS | Status: DC
Start: 2012-10-19 — End: 2012-10-19
  Filled 2012-10-19: qty 40

## 2012-10-19 MED ORDER — PANTOPRAZOLE SODIUM 40 MG IV SOLR
40.00 mg | Freq: Every day | INTRAVENOUS | Status: DC
Start: 2012-10-19 — End: 2012-10-19
  Administered 2012-10-19: 40 mg via INTRAVENOUS

## 2012-10-19 MED ORDER — DICYCLOMINE HCL 20 MG PO TABS
20.0000 mg | ORAL_TABLET | Freq: Four times a day (QID) | ORAL | Status: AC
Start: 2012-10-19 — End: 2012-10-26

## 2012-10-19 MED ORDER — ONDANSETRON HCL 4 MG/2ML IJ SOLN
4.00 mg | Freq: Once | INTRAMUSCULAR | Status: AC
Start: 2012-10-19 — End: 2012-10-19
  Administered 2012-10-19: 4 mg via INTRAVENOUS
  Filled 2012-10-19: qty 2

## 2012-10-19 MED ORDER — IOHEXOL 350 MG/ML IV SOLN
100.00 mL | Freq: Once | INTRAVENOUS | Status: AC | PRN
Start: 2012-10-19 — End: 2012-10-19
  Administered 2012-10-19: 100 mL via INTRAVENOUS

## 2012-10-19 MED ORDER — SODIUM CHLORIDE 0.9 % IV SOLN
100.00 mL/h | INTRAVENOUS | Status: AC
Start: 2012-10-19 — End: 2012-10-19
  Administered 2012-10-19: 100 mL/h via INTRAVENOUS

## 2012-10-19 NOTE — ED Notes (Signed)
Bed:BL18<BR> Expected date:<BR> Expected time:<BR> Means of arrival:[id number] [place]<BR> Comments:<BR>

## 2012-10-19 NOTE — ED Provider Notes (Signed)
Physician/Midlevel provider first contact with patient: 10/19/12 0334         Pt c/o abd pain and vomiting since yesterday.  Pt was seen at [facility] yesterday, received dilaudid and zofran.  Pt woke up at 2 AM with vomiting and abd pain.  Pt states that she has had some green colored emesis this AM.  Hx from the pt.    Review of Systems   Constitutional: Negative.    HENT: Negative.    Respiratory: Negative.    Cardiovascular: Negative.    Gastrointestinal: Positive for vomiting and abdominal pain.   Genitourinary: Negative.    All other systems reviewed and are negative.      Physical Exam   Nursing note and vitals reviewed.  Constitutional: She is oriented to person, place, and time. She appears distressed.   HENT:   Head: Normocephalic and atraumatic.   Neck: Normal range of motion. Neck supple.   Cardiovascular: Normal rate and regular rhythm.    Pulmonary/Chest: Effort normal and breath sounds normal.   Abdominal: Soft.        Epigastric tenderness   Musculoskeletal: Normal range of motion. She exhibits no edema.   Neurological: She is alert and oriented to person, place, and time. GCS score is 15.   Skin: Skin is warm and dry. She is not diaphoretic.     Oxygen saturations are 100%    EKG: normal EKG, normal sinus rhythm, normal axis, normal intervals, no ST or T wave abnormalities    4:20 AM  Pt feeling a bit better now.  Await results.    5:01 AM  Pt feeling a bit better, but still with abd pain.  Will re-medicate.    6:39 AM  D/w Dr. Baldwin Jamaica at [facility], will get appointment for the pt with her PMD.    Jethro Bastos, MD  10/19/12 [phone]

## 2012-10-19 NOTE — Discharge Instructions (Signed)
Abdominal Pain    You have been diagnosed with abdominal (belly) pain. The cause of your pain is not yet known.    Many things can cause abdominal pain. Examples include viral infections and bowel (intestine) spasms. You might need another examination or more tests to find out why you have pain.    At this time, your pain does not seem to be caused by anything dangerous. You do not need surgery. You do not need to stay in the hospital.     Though we don't believe your condition is dangerous right now, it is important to be careful. Sometimes a problem that seems mild can become serious later. This is why it is very important that you return here or go to the nearest Emergency Department unless you are 100% improved.    Return here or go to the nearest Emergency Department, or follow up with your physician in:   12 hours.    Drink only clear liquids such as water, clear broth, sports drinks, or clear caffeine-free soft drinks, like 7-Up or Sprite, for the next:   12 hours.    YOU SHOULD SEEK MEDICAL ATTENTION IMMEDIATELY, EITHER HERE OR AT THE NEAREST EMERGENCY DEPARTMENT, IF ANY OF THE FOLLOWING OCCURS:   Your pain does not go away or gets worse.   You cannot keep fluids down or your vomit is dark green.    You vomit blood or see blood in your stool. Blood might be bright red or dark red. It can also be black and look like tar.   You have a fever or shaking chills.   Your skin or eyes look yellow or your urine looks brown.   You have severe diarrhea.

## 2012-10-19 NOTE — ED Notes (Signed)
Pt seen at [facility] yesterday given Phenergan and Dilaudid IV which helped with the abdominal pain and emesis, per EMS and patient emesis and abdominal pain got worse this past Sunday, PCP told her to increase her dose of protonix at home to try to help with s/s but pt states this made her 'heart race' so she stopped taking the higher dose. Pt states around 0200 she awoke with severe pain and her 'heart racing' again.

## 2012-11-02 DIAGNOSIS — G459 Transient cerebral ischemic attack, unspecified: Secondary | ICD-10-CM

## 2012-11-02 HISTORY — DX: Transient cerebral ischemic attack, unspecified: G45.9

## 2012-11-21 ENCOUNTER — Other Ambulatory Visit: Payer: Self-pay

## 2012-12-06 ENCOUNTER — Emergency Department: Payer: PRIVATE HEALTH INSURANCE

## 2012-12-06 ENCOUNTER — Emergency Department
Admission: EM | Admit: 2012-12-06 | Discharge: 2012-12-06 | Disposition: A | Discharge status: Other Acute Inpatient Hospital | Payer: PRIVATE HEALTH INSURANCE | Attending: Emergency Medicine | Admitting: Emergency Medicine

## 2012-12-06 DIAGNOSIS — R1013 Epigastric pain: Secondary | ICD-10-CM | POA: Insufficient documentation

## 2012-12-06 DIAGNOSIS — I10 Essential (primary) hypertension: Secondary | ICD-10-CM | POA: Insufficient documentation

## 2012-12-06 DIAGNOSIS — K219 Gastro-esophageal reflux disease without esophagitis: Secondary | ICD-10-CM | POA: Insufficient documentation

## 2012-12-06 LAB — URINALYSIS, REFLEX TO MICROSCOPIC EXAM IF INDICATED
Bilirubin, UA: NEGATIVE
Blood, UA: NEGATIVE
Glucose, UA: NEGATIVE
Ketones UA: NEGATIVE
Leukocyte Esterase, UA: NEGATIVE
Nitrite, UA: NEGATIVE
Protein, UR: NEGATIVE
Specific Gravity UA: 1.003 (ref 1.001–1.035)
Urine pH: 7 (ref 5.0–8.0)
Urobilinogen, UA: NEGATIVE mg/dL

## 2012-12-06 LAB — COMPREHENSIVE METABOLIC PANEL
ALT: 13 U/L (ref 0–55)
AST (SGOT): 15 U/L (ref 5–34)
Albumin/Globulin Ratio: 1.2 (ref 0.9–2.2)
Albumin: 3.4 g/dL — ABNORMAL LOW (ref 3.5–5.0)
Alkaline Phosphatase: 94 U/L (ref 40–150)
Anion Gap: 9 (ref 5.0–15.0)
BUN: 9 mg/dL (ref 7.0–19.0)
Bilirubin, Total: 0.5 mg/dL (ref 0.2–1.2)
CO2: 26 mEq/L (ref 22–29)
Calcium: 9.3 mg/dL (ref 8.5–10.5)
Chloride: 108 mEq/L — ABNORMAL HIGH (ref 98–107)
Creatinine: 0.7 mg/dL (ref 0.6–1.0)
Globulin: 2.9 g/dL (ref 2.0–3.6)
Glucose: 85 mg/dL (ref 70–100)
Potassium: 3.5 mEq/L (ref 3.5–5.1)
Protein, Total: 6.3 g/dL (ref 6.0–8.3)
Sodium: 143 mEq/L (ref 136–145)

## 2012-12-06 LAB — CBC AND DIFFERENTIAL
Basophils Absolute Automated: 0.02 10*3/uL (ref 0.00–0.20)
Basophils Automated: 0 %
Eosinophils Absolute Automated: 0.01 10*3/uL (ref 0.00–0.70)
Eosinophils Automated: 0 %
Hematocrit: 39.7 % (ref 37.0–47.0)
Hgb: 13.4 g/dL (ref 12.0–16.0)
Immature Granulocytes Absolute: 0.01 10*3/uL
Immature Granulocytes: 0 %
Lymphocytes Absolute Automated: 1.59 10*3/uL (ref 0.50–4.40)
Lymphocytes Automated: 22 %
MCH: 28.6 pg (ref 28.0–32.0)
MCHC: 33.8 g/dL (ref 32.0–36.0)
MCV: 84.6 fL (ref 80.0–100.0)
MPV: 9.1 fL — ABNORMAL LOW (ref 9.4–12.3)
Monocytes Absolute Automated: 0.64 10*3/uL (ref 0.00–1.20)
Monocytes: 9 %
Neutrophils Absolute: 5.05 10*3/uL (ref 1.80–8.10)
Neutrophils: 69 %
Nucleated RBC: 0 /100 WBC (ref 0–1)
Platelets: 260 10*3/uL (ref 140–400)
RBC: 4.69 10*6/uL (ref 4.20–5.40)
RDW: 15 % (ref 12–15)
WBC: 7.31 10*3/uL (ref 3.50–10.80)

## 2012-12-06 LAB — HEMOLYSIS INDEX: Hemolysis Index: 6 Index (ref 0–18)

## 2012-12-06 LAB — GFR: EGFR: >60

## 2012-12-06 LAB — LIPASE: Lipase: 6 U/L — ABNORMAL LOW (ref 8–78)

## 2012-12-06 MED ORDER — LIDOCAINE VISCOUS 2 % MT SOLN
10.0000 mL | Freq: Once | OROMUCOSAL | Status: AC
Start: 2012-12-06 — End: 2012-12-06
  Administered 2012-12-06: 10 mL via OROMUCOSAL
  Filled 2012-12-06: qty 15

## 2012-12-06 MED ORDER — HYDROMORPHONE HCL PF 1 MG/ML IJ SOLN
1.0000 mg | Freq: Once | INTRAMUSCULAR | Status: DC | PRN
Start: 2012-12-06 — End: 2012-12-06

## 2012-12-06 MED ORDER — HYDROMORPHONE HCL PF 1 MG/ML IJ SOLN
1.0000 mg | Freq: Once | INTRAMUSCULAR | Status: AC
Start: 2012-12-06 — End: 2012-12-06
  Administered 2012-12-06: 1 mg via INTRAVENOUS
  Filled 2012-12-06: qty 1

## 2012-12-06 MED ORDER — ALUM & MAG HYDROXIDE-SIMETH 200-200-20 MG/5ML PO SUSP
30.0000 mL | Freq: Once | ORAL | Status: AC
Start: 2012-12-06 — End: 2012-12-06
  Administered 2012-12-06: 30 mL via ORAL
  Filled 2012-12-06: qty 30

## 2012-12-06 MED ORDER — ONDANSETRON HCL 4 MG/2ML IJ SOLN
4.0000 mg | Freq: Once | INTRAMUSCULAR | Status: AC
Start: 2012-12-06 — End: 2012-12-06
  Administered 2012-12-06: 4 mg via INTRAVENOUS
  Filled 2012-12-06: qty 2

## 2012-12-06 MED ORDER — SODIUM CHLORIDE 0.9 % IV BOLUS
1000.0000 mL | Freq: Once | INTRAVENOUS | Status: AC
Start: 2012-12-06 — End: 2012-12-06
  Administered 2012-12-06: 1000 mL via INTRAVENOUS

## 2012-12-06 NOTE — ED Notes (Signed)
Bed:PUTR<BR> Expected date:<BR> Expected time:<BR> Means of arrival:<BR> Comments:<BR>

## 2012-12-06 NOTE — ED Notes (Signed)
Epigastric pain for "a while." worsening today while at MD appt at [facility]. Pain 10/10, c/o nausea

## 2012-12-06 NOTE — ED Provider Notes (Signed)
EMERGENCY DEPARTMENT HISTORY AND PHYSICAL EXAM     Physician/Midlevel provider first contact with patient: 12/06/12 1447         Date: 12/06/2012  Patient Name: Amber [name]    History of Presenting Illness     Chief Complaint   Patient presents with   . Abdominal Pain       History Provided By: Patient     Chief Complaint: abdominal pain   Onset: x3 days   Timing: gradual    Location: abdomen   Quality: aching, dizziness  Severity: moderate   Modifying Factors: exacerbated w/ food and liquids   Associated Symptoms: chills, dizziness, vision changes, palpitations     Additional History: Amber [name] is a 62 y.o. female., c/o gradually worsening intermittent epigastric abdominal pain radiating to his back w/ associated chills, dizziness, vision changes, palpitations x3 days. Pt states pain is exacerbated w/ food and liquids. Pt states she was seen by [facility] yesterday, was informed she was fine and sent home. Pt states pain has been ongoing x2 months but has not officially subsided. Pt denies fever, chest pain, SOB, N/V/D, numbness, weakness or any other Sx.     PCP: Amber [name]      Current Facility-Administered Medications   Medication Dose Route Frequency Provider Last Rate Last Dose   . [COMPLETED] Alum-Mag Hydrox-Simeth (MAALOX PLUS) 200-200-20 MG/5ML suspension 30 mL  30 mL Oral Once Nicholes Rough, [name]   30 mL at 12/06/12 1630   . [COMPLETED] HYDROmorphone (DILAUDID) injection 1 mg  1 mg Intravenous Once Nicholes Rough, [name]   1 mg at 12/06/12 1632   . HYDROmorphone (DILAUDID) injection 1 mg  1 mg Intravenous Once Nicholes Rough, [name]       . [COMPLETED] lidocaine (XYLOCAINE) 2 % viscous mouth solution 10 mL  10 mL Mouth/Throat Once Nicholes Rough, [name]   10 mL at 12/06/12 1630   . [COMPLETED] ondansetron (ZOFRAN) injection 4 mg  4 mg Intravenous Once Nicholes Rough, [name]   4 mg at 12/06/12 1630   . [COMPLETED] sodium chloride 0.9 % bolus 1,000 mL  1,000 mL Intravenous Once Nicholes Rough, [name]    1,000 mL at 12/06/12 1636   . [DISCONTINUED] HYDROmorphone (DILAUDID) injection 1 mg  1 mg Intravenous Once PRN Nicholes Rough, [name]         Current Outpatient Prescriptions   Medication Sig Dispense Refill   . diltiazem (TIAZAC) 120 MG 24 hr capsule Take 120 mg by mouth daily.       Marland Kitchen ethacrynic acid (EDECRIN) 25 MG tablet Take 25 mg by mouth daily.       . pantoprazole (PROTONIX) 40 MG tablet Take 40 mg by mouth daily.       . valACYclovir HCL (VALTREX) 500 MG tablet Take 500 mg by mouth as needed.           Past History     Past Medical History:  Past Medical History   Diagnosis Date   . Hypertensive disorder    . GERD (gastroesophageal reflux disease)    . Hiatal hernia        Past Surgical History:  History reviewed. No pertinent past surgical history.    Family History:  No family history on file.    Social History:  History   Substance Use Topics   . Smoking status: Never Smoker    . Smokeless tobacco: Not on file   . Alcohol Use: No  Allergies:  No Known Allergies    Review of Systems     Review of Systems   Constitutional: Positive for chills. Negative for fever.   HENT: Negative for sore throat and rhinorrhea.    Eyes: Positive for visual disturbance. Negative for pain.   Respiratory: Negative for cough and shortness of breath.    Cardiovascular: Positive for palpitations. Negative for chest pain.   Gastrointestinal: Positive for abdominal pain (epigastric ). Negative for nausea, vomiting, diarrhea, constipation and blood in stool.   Genitourinary: Negative for dysuria and hematuria.   Musculoskeletal: Positive for back pain. Negative for myalgias and arthralgias.   Skin: Negative for color change and rash.   Neurological: Positive for dizziness. Negative for weakness, numbness and headaches.          Physical Exam   BP 153/73  Pulse 68  Temp 96.3 F (35.7 C)  Resp 32  SpO2 100%    Physical Exam   Nursing note and vitals reviewed.  Constitutional: She is oriented to person, place, and time. She  appears well-developed and well-nourished. No distress.   HENT:   Head: Normocephalic and atraumatic.   Mouth/Throat: Oropharynx is clear and moist.   Eyes: Conjunctivae normal and EOM are normal. Pupils are equal, round, and reactive to light. Right eye exhibits no discharge. Left eye exhibits no discharge. No scleral icterus.   Neck: Normal range of motion. Neck supple. No tracheal deviation present.   Cardiovascular: Normal rate, regular rhythm and normal heart sounds.  Exam reveals no gallop and no friction rub.    No murmur heard.  Pulmonary/Chest: Effort normal and breath sounds normal. No stridor. No respiratory distress. She has no wheezes. She has no rales.   Abdominal: She exhibits no distension. There is tenderness in the epigastric area. There is no rebound and no guarding.   Musculoskeletal: She exhibits no edema and no tenderness.   Lymphadenopathy:     She has no cervical adenopathy.   Neurological: She is alert and oriented to person, place, and time.   Skin: Skin is warm and dry. No rash noted.   Psychiatric: She has a normal mood and affect. Her behavior is normal. Judgment and thought content normal.         Diagnostic Study Results     Labs -     Results     Procedure Component Value Units Date/Time    Comprehensive Metabolic Panel (CMP) [[id number]]  (Abnormal) Collected:12/06/12 1600    Specimen Information:Blood Updated:12/06/12 1628     Glucose 85 mg/dL      BUN 9.0 mg/dL      Creatinine 0.7 mg/dL      Sodium 409 mEq/L      Potassium 3.5 mEq/L      Chloride 108 (H) mEq/L      CO2 26 mEq/L      Calcium 9.3 mg/dL      Protein, Total 6.3 g/dL      Albumin 3.4 (L) g/dL      AST (SGOT) 15 U/L      ALT 13 U/L      Alkaline Phosphatase 94 U/L      Bilirubin, Total 0.5 mg/dL      Globulin 2.9 g/dL      Albumin/Globulin Ratio 1.2      Anion Gap 9.0     Lipase [[id number]]  (Abnormal) Collected:12/06/12 1600    Specimen Information:Blood Updated:12/06/12 1628     Lipase 6 (L) U/L  HEMOLYZED INDEX  [[id number]] Collected:12/06/12 1600     Hemolyzed Index 6 Index Updated:12/06/12 1628    GFR [[id number]] Collected:12/06/12 1600     EGFR >60.0 Updated:12/06/12 1628    UA, Reflex to Microscopic [[id number]] Collected:12/06/12 1600    Specimen Information:Urine Updated:12/06/12 1614     Urine Type Clean Catch      Color, UA Colorless      Clarity, UA Clear      Specific Gravity UA 1.003      Urine pH 7.0      Leukocytes, UA Negative      Nitrite, UA Negative      Protein, UA Negative      Glucose, UA Negative      Ketones UA Negative      Urobilinogen, UA Negative mg/dL      Bilirubin, UA Negative      Blood, UA Negative     CBC and differential [[id number]]  (Abnormal) Collected:12/06/12 1600    Specimen Information:Blood / Blood Updated:12/06/12 1613     WBC 7.31 x10 3/uL      RBC 4.69 x10 6/uL      Hgb 13.4 g/dL      Hematocrit 63.0 %      MCV 84.6 fL      MCH 28.6 pg      MCHC 33.8 g/dL      RDW 15 %      Platelets 260 x10 3/uL      MPV 9.1 (L) fL      Neutrophils 69 %      Lymphocytes Automated 22 %      Monocytes 9 %      Eosinophils Automated 0 %      Basophils Automated 0 %      Immature Granulocyte 0 %      Nucleated RBC 0 /100 WBC      Neutrophils Absolute 5.05 x10 3/uL      Abs Lymph Automated 1.59 x10 3/uL      Abs Mono Automated 0.64 x10 3/uL      Abs Eos Automated 0.01 x10 3/uL      Absolute Baso Automated 0.02 x10 3/uL      Absolute Immature Granulocyte 0.01 x10 3/uL           Radiologic Studies -   Radiology Results (24 Hour)     ** No Results found for the last 24 hours. **      .      Medical Decision Making   I am the first provider for this patient.    I reviewed the vital signs, available nursing notes, past medical history, past surgical history, family history and social history.    Vital Signs-Reviewed the patient's vital signs.     Patient Vitals for the past 12 hrs:   BP Temp Pulse Resp   12/06/12 1635 153/73 mmHg - 68  32    12/06/12 1348 158/57 mmHg 96.3 F (35.7 C) 85  18        Pulse  Oximetry Analysis - Normal 100% on RA    EKG: The emergency physician ordered, reviewed, and independently interpreted the EKG.               Time Interpreted: 1340  Rate:  72  Rhythm:  NSR  Interpretation:  Normal Sinus Rhythm at 72.  Normal axis.  Normal PR, QRS, and QT intervals.  Normal ST & T segments.  MInimal voltage for LVH.  Impression: Borderline EKG.   Compared to previous EKG dated  10-19-12, no significant change is noted.    ED Course:   1750: Discussed patient case with Mikael Spray, will admit patient.     Provider Notes: Epigastric pain of unclear etiology, difficult to control.  Will transfer to [facility] for add'l w/u.  Doubt acute pancreatitis.    Diagnosis     Clinical Impression:   1. Epigastric abdominal pain        _______________________________    Attestations:  This note is prepared by Allena Napoleon, acting as Scribe for Nicholes Rough, [name].    Nicholes Rough, [name]. The scribe's documentation has been prepared under my direction and personally reviewed by me in its entirety.  I confirm that the note above accurately reflects all work, treatment, procedures, and medical decision making performed by me.    Dr. Leighton Roach is the primary Emergency Physician of record.      _______________________________          Nicholes Rough, [name]  12/06/12 [phone]

## 2012-12-07 LAB — ECG 12-LEAD
Atrial Rate: 72 {beats}/min
P Axis: 47 degrees
P-R Interval: 138 ms
Q-T Interval: 394 ms
QRS Duration: 78 ms
QTC Calculation (Bezet): 431 ms
R Axis: -5 degrees
T Axis: 6 degrees
Ventricular Rate: 72 {beats}/min

## 2012-12-22 ENCOUNTER — Encounter (HOSPITAL_BASED_OUTPATIENT_CLINIC_OR_DEPARTMENT_OTHER): Payer: No Typology Code available for payment source | Admitting: Gastroenterology

## 2012-12-28 ENCOUNTER — Encounter (HOSPITAL_BASED_OUTPATIENT_CLINIC_OR_DEPARTMENT_OTHER): Payer: No Typology Code available for payment source | Admitting: Gastroenterology

## 2013-01-12 ENCOUNTER — Ambulatory Visit: Payer: No Typology Code available for payment source | Attending: Gastroenterology | Admitting: Gastroenterology

## 2013-01-12 ENCOUNTER — Ambulatory Visit (HOSPITAL_BASED_OUTPATIENT_CLINIC_OR_DEPARTMENT_OTHER): Payer: No Typology Code available for payment source | Admitting: Gastroenterology

## 2013-01-12 ENCOUNTER — Other Ambulatory Visit (HOSPITAL_BASED_OUTPATIENT_CLINIC_OR_DEPARTMENT_OTHER): Payer: Self-pay | Admitting: Gastroenterology

## 2013-01-12 DIAGNOSIS — Z800 Family history of malignant neoplasm of digestive organs: Secondary | ICD-10-CM | POA: Insufficient documentation

## 2013-01-12 DIAGNOSIS — R1084 Generalized abdominal pain: Secondary | ICD-10-CM | POA: Insufficient documentation

## 2013-01-12 LAB — COMPREHENSIVE METABOLIC PANEL
ALT (GPT): 20 U/L (ref 6–40)
AST (GOT): 24 U/L (ref 15–40)
Albumin: 4.2 g/dL (ref 3.5–5.2)
Alkaline Phosphatase (Total): 97 U/L (ref 31–132)
Anion Gap: 10 (ref 3–11)
Bilirubin (Total): 0.9 mg/dL (ref 0.2–1.3)
Calcium: 9.9 mg/dL (ref 8.9–10.2)
Carbon Dioxide, Total: 27 mEq/L (ref 22–32)
Chloride: 104 mEq/L (ref 98–108)
Creatinine: 0.76 mg/dL (ref 0.38–1.02)
GFR, Calc, African American: >60 mL/min (ref >59)
GFR, Calc, European American: >60 mL/min (ref >59)
Glucose: 96 mg/dL (ref 62–125)
Potassium: 4.2 mEq/L (ref 3.7–5.2)
Protein (Total): 7 g/dL (ref 6.0–8.2)
Sodium: 141 mEq/L (ref 136–145)
Urea Nitrogen: 10 mg/dL (ref 8–21)

## 2013-01-12 LAB — CBC (HEMOGRAM)
Hematocrit: 42 % (ref 36–45)
Hemoglobin: 14.3 g/dL (ref 11.5–15.5)
MCH: 28.5 pg (ref 27.3–33.6)
MCHC: 33.8 g/dL (ref 32.2–36.5)
MCV: 84 fL (ref 81–98)
Platelet Count: 282 10*3/uL (ref 150–400)
RBC: 5.02 mil/uL — ABNORMAL HIGH (ref 3.80–5.00)
RDW-CV: 14.6 % — ABNORMAL HIGH (ref 11.6–14.4)
WBC: 6.13 10*3/uL (ref 4.3–10.0)

## 2013-01-12 LAB — THYROID STIMULATING HORMONE: Thyroid Stimulating Hormone: 0.62 u[IU]/mL (ref 0.400–5.000)

## 2013-01-12 LAB — HEMOGLOBIN A1C, HPLC: Hemoglobin A1C: 6.1 % — ABNORMAL HIGH (ref 4.0–6.0)

## 2013-01-13 LAB — CA 19-9: Ca 19-9: 24 U/mL (ref 0–54)

## 2013-01-16 LAB — VITAMIN D (25 HYDROXY)
Vit D (25_Hydroxy) Total: 48.2 ng/mL (ref 20.1–50.0)
Vitamin D2 (25_Hydroxy): 1.3 ng/mL
Vitamin D3 (25_Hydroxy): 46.9 ng/mL

## 2013-01-16 LAB — CARCINOEMBRYONIC ANTIGEN: Carcinoembryonic Antigen: 3.9 ng/mL (ref 0.0–5.0)

## 2013-01-18 LAB — CAROTENE: Carotene: 219 ug/dL (ref 90–280)

## 2013-04-02 DIAGNOSIS — E876 Hypokalemia: Secondary | ICD-10-CM | POA: Insufficient documentation

## 2013-04-02 DIAGNOSIS — K219 Gastro-esophageal reflux disease without esophagitis: Secondary | ICD-10-CM | POA: Insufficient documentation

## 2013-04-15 DIAGNOSIS — I639 Cerebral infarction, unspecified: Secondary | ICD-10-CM | POA: Insufficient documentation

## 2013-07-17 ENCOUNTER — Other Ambulatory Visit (HOSPITAL_BASED_OUTPATIENT_CLINIC_OR_DEPARTMENT_OTHER): Payer: No Typology Code available for payment source

## 2013-08-21 ENCOUNTER — Ambulatory Visit: Payer: No Typology Code available for payment source | Attending: Gastroenterology

## 2013-08-21 ENCOUNTER — Other Ambulatory Visit (HOSPITAL_BASED_OUTPATIENT_CLINIC_OR_DEPARTMENT_OTHER): Payer: Self-pay

## 2013-08-21 ENCOUNTER — Other Ambulatory Visit (HOSPITAL_BASED_OUTPATIENT_CLINIC_OR_DEPARTMENT_OTHER): Payer: Self-pay | Admitting: Gastroenterology

## 2013-08-21 DIAGNOSIS — K863 Pseudocyst of pancreas: Secondary | ICD-10-CM | POA: Insufficient documentation

## 2013-08-21 DIAGNOSIS — K862 Cyst of pancreas: Secondary | ICD-10-CM | POA: Insufficient documentation

## 2013-08-21 LAB — CREATININE BY I_STAT (POC), ~~LOC~~: Creatinine (POC): 1 mg/dL (ref 0.38–1.02)

## 2013-08-23 ENCOUNTER — Other Ambulatory Visit (HOSPITAL_BASED_OUTPATIENT_CLINIC_OR_DEPARTMENT_OTHER): Payer: Self-pay | Admitting: Gastroenterology

## 2013-11-02 ENCOUNTER — Emergency Department: Payer: PRIVATE HEALTH INSURANCE

## 2013-11-02 ENCOUNTER — Emergency Department
Admission: EM | Admit: 2013-11-02 | Discharge: 2013-11-02 | Disposition: A | Discharge status: Home / Self Care | Payer: PRIVATE HEALTH INSURANCE | Attending: Emergency Medicine | Admitting: Emergency Medicine

## 2013-11-02 DIAGNOSIS — Z8673 Personal history of transient ischemic attack (TIA), and cerebral infarction without residual deficits: Secondary | ICD-10-CM | POA: Insufficient documentation

## 2013-11-02 DIAGNOSIS — R1011 Right upper quadrant pain: Secondary | ICD-10-CM | POA: Insufficient documentation

## 2013-11-02 DIAGNOSIS — K219 Gastro-esophageal reflux disease without esophagitis: Secondary | ICD-10-CM | POA: Insufficient documentation

## 2013-11-02 DIAGNOSIS — K5900 Constipation, unspecified: Secondary | ICD-10-CM | POA: Insufficient documentation

## 2013-11-02 DIAGNOSIS — Z9089 Acquired absence of other organs: Secondary | ICD-10-CM | POA: Insufficient documentation

## 2013-11-02 DIAGNOSIS — I10 Essential (primary) hypertension: Secondary | ICD-10-CM | POA: Insufficient documentation

## 2013-11-02 DIAGNOSIS — Z9989 Dependence on other enabling machines and devices: Secondary | ICD-10-CM

## 2013-11-02 DIAGNOSIS — G4733 Obstructive sleep apnea (adult) (pediatric): Secondary | ICD-10-CM

## 2013-11-02 DIAGNOSIS — R1084 Generalized abdominal pain: Secondary | ICD-10-CM

## 2013-11-02 HISTORY — PX: SHOULDER SURGERY: SHX246

## 2013-11-02 HISTORY — PX: SPINE SURGERY: SHX786

## 2013-11-02 HISTORY — DX: Obstructive sleep apnea (adult) (pediatric): G47.33

## 2013-11-02 HISTORY — DX: Dependence on other enabling machines and devices: Z99.89

## 2013-11-02 LAB — COMPREHENSIVE METABOLIC PANEL
ALT: 11 U/L (ref 0–55)
AST (SGOT): 16 U/L (ref 5–34)
Albumin/Globulin Ratio: 1 (ref 0.9–2.2)
Albumin: 3.2 g/dL — ABNORMAL LOW (ref 3.5–5.0)
Alkaline Phosphatase: 92 U/L (ref 40–150)
Anion Gap: 8 (ref 5.0–15.0)
BUN: 10 mg/dL (ref 7.0–19.0)
Bilirubin, Total: 0.4 mg/dL (ref 0.2–1.2)
CO2: 28 mEq/L (ref 22–29)
Calcium: 9.7 mg/dL (ref 8.5–10.5)
Chloride: 108 mEq/L — ABNORMAL HIGH (ref 98–107)
Creatinine: 0.7 mg/dL (ref 0.6–1.0)
Globulin: 3.3 g/dL (ref 2.0–3.6)
Glucose: 95 mg/dL (ref 70–100)
Potassium: 4.4 mEq/L (ref 3.5–5.1)
Protein, Total: 6.5 g/dL (ref 6.0–8.3)
Sodium: 144 mEq/L (ref 136–145)

## 2013-11-02 LAB — CBC AND DIFFERENTIAL
Basophils Absolute Automated: 0.01 10*3/uL (ref 0.00–0.20)
Basophils Automated: 0 %
Eosinophils Absolute Automated: 0.04 10*3/uL (ref 0.00–0.70)
Eosinophils Automated: 1 %
Hematocrit: 39.6 % (ref 37.0–47.0)
Hgb: 12.9 g/dL (ref 12.0–16.0)
Immature Granulocytes Absolute: 0.01 10*3/uL
Immature Granulocytes: 0 %
Lymphocytes Absolute Automated: 1.35 10*3/uL (ref 0.50–4.40)
Lymphocytes Automated: 23 %
MCH: 28.9 pg (ref 28.0–32.0)
MCHC: 32.6 g/dL (ref 32.0–36.0)
MCV: 88.6 fL (ref 80.0–100.0)
MPV: 9.4 fL (ref 9.4–12.3)
Monocytes Absolute Automated: 0.42 10*3/uL (ref 0.00–1.20)
Monocytes: 7 %
Neutrophils Absolute: 4.09 10*3/uL (ref 1.80–8.10)
Neutrophils: 69 %
Nucleated RBC: 0 (ref 0–1)
Platelets: 244 10*3/uL (ref 140–400)
RBC: 4.47 10*6/uL (ref 4.20–5.40)
RDW: 14 % (ref 12–15)
WBC: 5.91 10*3/uL (ref 3.50–10.80)

## 2013-11-02 LAB — URINALYSIS, REFLEX TO MICROSCOPIC EXAM IF INDICATED
Bilirubin, UA: NEGATIVE
Blood, UA: NEGATIVE
Glucose, UA: NEGATIVE
Ketones UA: NEGATIVE
Leukocyte Esterase, UA: NEGATIVE
Nitrite, UA: NEGATIVE
Protein, UR: NEGATIVE
Specific Gravity UA: 1.01 (ref 1.001–1.035)
Urine pH: 8 (ref 5.0–8.0)
Urobilinogen, UA: NEGATIVE mg/dL

## 2013-11-02 LAB — ECG 12-LEAD
Atrial Rate: 81 {beats}/min
P Axis: 69 degrees
P-R Interval: 178 ms
Q-T Interval: 384 ms
QRS Duration: 86 ms
QTC Calculation (Bezet): 446 ms
R Axis: -11 degrees
T Axis: 20 degrees
Ventricular Rate: 81 {beats}/min

## 2013-11-02 LAB — GFR: EGFR: >60

## 2013-11-02 LAB — HEMOLYSIS INDEX: Hemolysis Index: 22 — ABNORMAL HIGH (ref 0–18)

## 2013-11-02 LAB — TROPONIN I: Troponin I: <0.01 ng/mL

## 2013-11-02 LAB — LIPASE: Lipase: 6 U/L — ABNORMAL LOW (ref 8–78)

## 2013-11-02 MED ORDER — MORPHINE SULFATE 2 MG/ML IJ/IV SOLN (WRAP)
4.0000 mg | Freq: Once | Status: AC
Start: 2013-11-02 — End: 2013-11-02
  Administered 2013-11-02: 4 mg via INTRAVENOUS
  Filled 2013-11-02: qty 2

## 2013-11-02 MED ORDER — ONDANSETRON HCL 4 MG/2ML IJ SOLN
4.0000 mg | Freq: Once | INTRAMUSCULAR | Status: AC
Start: 2013-11-02 — End: 2013-11-02
  Administered 2013-11-02: 4 mg via INTRAVENOUS
  Filled 2013-11-02: qty 2

## 2013-11-02 NOTE — ED Notes (Signed)
QIO:NG29<BM> Expected date:11/02/13<BR> Expected time: 4:44 AM<BR> Means of arrival:[id number] [address]<BR> Comments:<BR> Abdominal pain and chest pain

## 2013-11-02 NOTE — Discharge Instructions (Signed)
Start taking Mirolax for constipation.     Abdominal Pain    You have been diagnosed with abdominal (belly) pain. The cause of your pain is not yet known.    Many things can cause abdominal pain. Examples include viral infections and bowel (intestine) spasms. You might need another examination or more tests to find out why you have pain.    At this time, your pain does not seem to be caused by anything dangerous. You do not need surgery. You do not need to stay in the hospital.     Though we don't believe your condition is dangerous right now, it is important to be careful. Sometimes a problem that seems mild can become serious later. This is why it is very important that you return here or go to the nearest Emergency Department unless you are 100% improved.    YOU SHOULD SEEK MEDICAL ATTENTION IMMEDIATELY, EITHER HERE OR AT THE NEAREST EMERGENCY DEPARTMENT, IF ANY OF THE FOLLOWING OCCURS:   Your pain does not go away or gets worse.   You cannot keep fluids down or your vomit is dark green.    You vomit blood or see blood in your stool. Blood might be bright red or dark red. It can also be black and look like tar.   You have a fever or shaking chills.   Your skin or eyes look yellow or your urine looks brown.   You have severe diarrhea.

## 2013-11-02 NOTE — ED Notes (Signed)
Pt BIBA was seen at [facility] for RUQ abdominal pain starting Christmas Eve and diagnosed with bowel impaction which she was treated with mag citrate. Has had a "a few small green bowel movements". Pt states started feeling worse yesterday but awoke this morning with RUQ pain radiating to chest and back. Was given 324mg  of aspirin and had "unremarkable EKG" by EMS PTA.

## 2013-11-02 NOTE — ED Provider Notes (Signed)
EMERGENCY DEPARTMENT HISTORY AND PHYSICAL EXAM     Physician/Midlevel provider first contact with patient: 11/02/13 1610         Date: 11/02/2013  Patient Name: Amber [name]  Attending Physician: Gweneth Dimitri DO  Diagnosis and Treatment Plan       Clinical Impression:   1. RUQ abdominal pain    2. Constipation        Treatment Plan:   ED Disposition     Discharge Amber [name] discharge to home/self care.    Condition at disposition: Stable            History of Presenting Illness     Chief Complaint   Patient presents with   . Abdominal Pain   . Chest Pain       History Provided By: Pt  Chief Complaint: abdominal pain  Onset: worse yesterday  Timing: worsening, waxing/waning  Location:RUQ   Quality: ache  Severity: moderate  Modifying Factors:none   Associated sxs: nausea, dry heaving    Additional History: Amber [name] is a 63 y.o. female with hx of HTN and TIA x2, BIBA c/o waxing/waning, ride-sided abdominal pain, radiating to the back that worsened yesterday and again this morning w/ associated nausea, dry-heaving. Pt was evaluated at [facility] 10/25/13 for the same pain, with Dx of bowel moderate stool in right colon on x-ray which was treated with Mag Citrate. Pt reports Nl bowel movements since then, although the abdominal pain has not improved. Denies any PMHx of DVT or PE. Denies dysuria, hematuria, coughing of blood, CP, or any recent cough or cold Sx.       PCP: Magbuhos, Celerino M, MD      Current facility-administered medications:[COMPLETED] morphine injection 4 mg, 4 mg, Intravenous, Once, Madaline Brilliant, DO, 4 mg at 11/02/13 0748;  [COMPLETED] ondansetron [facility]) injection 4 mg, 4 mg, Intravenous, Once, Madaline Brilliant, DO, 4 mg at 11/02/13 9604  Current outpatient prescriptions:aspirin 81 MG tablet, Take 81 mg by mouth daily., Disp: , Rfl: ;  diltiazem (TIAZAC) 120 MG 24 hr capsule, Take 120 mg by mouth daily., Disp: , Rfl: ;  ethacrynic acid (EDECRIN) 25 MG tablet, Take  25 mg by mouth daily., Disp: , Rfl: ;  pantoprazole (PROTONIX) 40 MG tablet, Take 40 mg by mouth daily., Disp: , Rfl: ;  valACYclovir HCL (VALTREX) 500 MG tablet, Take 500 mg by mouth as needed., Disp: , Rfl:     Past Medical History     Past Medical History   Diagnosis Date   . Hypertensive disorder    . GERD (gastroesophageal reflux disease)    . Hiatal hernia      Past Surgical History   Procedure Date   . Ovary surgery    . Appendectomy    . Fallopian tube surgery        Family History     History reviewed. No pertinent family history.    Social History     History     Social History   . Marital Status: Single     Spouse Name: N/A     Number of Children: N/A   . Years of Education: N/A     Social History Main Topics   . Smoking status: Never Smoker    . Smokeless tobacco: Not on file   . Alcohol Use: No   . Drug Use: No   . Sexually Active: Not on file     Other Topics Concern   .  Not on file     Social History Narrative   . No narrative on file        Allergies     No Known Allergies    Review of Systems     General: No fever, sweats or chills.  HENT: No headache, no neck pain, no nasal congestion, no sore throat  Respiratory: No cough, no shortness of breath.  Cardiovascular: No chest pain, no leg swelling.   Gastrointestinal: + R sided abdominal pain and dry heaving, and nausea, no vomiting, no diarrhea or constipation (resolved after mag citrate)  Genito-Urinary: No dysuria, no hematuria, + right flank pain.   Musculoskeletal: No myalgias, no joint pains.   Neurological: No new focal weakness, no sensory changes.   Dermatological: No new rashes, no color changes.   Psychological: No acute mood changes, no confusion.     Physical Exam     BP 135/84  Pulse 78  Temp 97.5 F (36.4 C)  Resp 20  SpO2 100%    Constitutional: Vital signs reviewed. Well appearing, uncomfortable in pain.   Head: Normocephalic, atraumatic. No external trauma noted.  Eyes: Conjunctiva and sclera are normal. Extraocular movements  intact, pupils equal, round, reactive.  Ear, Nose, Throat:  Normal external examination of the nose and ears. Oropharynx clear, moist mucous membranes.  Neck: Supple. Trachea midline.  No cervical lymphadenopathy. No midline cervical spine tenderness.  Respiratory/Chest: Clear to auscultation. No respiratory distress. No wheezing, rhonchi or rales. No chest wall tenderness or crepitus.   Cardiovascular: Regular rate. Regular rhythm. S1, S2.   Abdomen: + RUQ tenderness, Murphy's sign Normoactive Bowel sounds. Soft. No guarding or rebound.  Back: No midline tenderness, + mild R sided CVA tenderness.   Extremities: Upper and lower extremities with no cyanosis. No edema. No calf tenderness. Normal +2 pulses in all extremities.  Skin: Warm and dry. No rash.  Neuro: alert and appropriate,  upper and lower extremities normal strength, sensation intact.    Diagnostic Study Results     Labs -     Results     Procedure Component Value Units Date/Time    UA, Reflex to Microscopic [[id number]] Collected:11/02/13 0803    Specimen Information:Urine Updated:11/02/13 0819     Urine Type Clean Catch      Color, UA Straw      Clarity, UA Clear      Specific Gravity UA 1.010      Urine pH 8.0      Leukocyte Esterase, UA Negative      Nitrite, UA Negative      Protein, UR Negative      Glucose, UA Negative      Ketones UA Negative      Urobilinogen, UA Negative mg/dL      Bilirubin, UA Negative      Blood, UA Negative     Troponin I [[id number]] Collected:11/02/13 0555    Specimen Information:Blood Updated:11/02/13 0636     Troponin I <0.01 ng/mL     Comprehensive metabolic panel [[id number]]  (Abnormal) Collected:11/02/13 0555    Specimen Information:Blood Updated:11/02/13 0630     Glucose 95 mg/dL      BUN 08.6 mg/dL      Creatinine 0.7 mg/dL      Sodium 578 mEq/L      Potassium 4.4 mEq/L      Chloride 108 (H) mEq/L      CO2 28 mEq/L      CALCIUM 9.7 mg/dL  Protein, Total 6.5 g/dL      Albumin 3.2 (L) g/dL      AST (SGOT) 16 U/L       ALT 11 U/L      Alkaline Phosphatase 92 U/L      Bilirubin, Total 0.4 mg/dL      Globulin 3.3 g/dL      Albumin/Globulin Ratio 1.0      Anion Gap 8.0     Lipase [[id number]]  (Abnormal) Collected:11/02/13 0555    Specimen Information:Blood Updated:11/02/13 0630     Lipase 6 (L) U/L     Hemolysis index [[id number]]  (Abnormal) Collected:11/02/13 0555     Hemolysis Index 22 (H) Updated:11/02/13 0630    GFR [[id number]] Collected:11/02/13 0555     EGFR >60.0 Updated:11/02/13 0630    CBC and differential [[id number]] Collected:11/02/13 0555    Specimen Information:Blood / Blood Updated:11/02/13 0627     WBC 5.91 x10 3/uL      RBC 4.47 x10 6/uL      Hgb 12.9 g/dL      Hematocrit 15.1 %      MCV 88.6 fL      MCH 28.9 pg      MCHC 32.6 g/dL      RDW 14 %      Platelets 244 x10 3/uL      MPV 9.4 fL      Neutrophils 69 %      Lymphocytes Automated 23 %      Monocytes 7 %      Eosinophils Automated 1 %      Basophils Automated 0 %      Immature Granulocyte 0 %      Nucleated RBC 0      Neutrophils Absolute 4.09 x10 3/uL      Abs Lymph Automated 1.35 x10 3/uL      Abs Mono Automated 0.42 x10 3/uL      Abs Eos Automated 0.04 x10 3/uL      Absolute Baso Automated 0.01 x10 3/uL      Absolute Immature Granulocyte 0.01 x10 3/uL           Radiologic Studies -   Radiology Results (24 Hour)     Procedure Component Value Units Date/Time    US Abdomen Complete [[id number]] Collected:11/02/13 0838    Order Status:Completed  Updated:11/02/13 0626    Narrative:    INDICATION: Abdominal pain    TECHNIQUE: Transabdominal scanning was performed.    FINDINGS: The gallbladder has a normal appearance with no evidence of  intraluminal calculi. No biliary ductal dilatation is present. The  common duct measures 3 mm.    No abnormality is identified in the liver, spleen, pancreas, kidneys,  aorta and IVC. There is no evidence of ascites.      Impression:     Normal examination.    Aquilla Hacker, MD   11/02/2013 8:38 AM    XR Chest AP Portable [[id number]]  Collected:11/02/13 0734    Order Status:Completed  Updated:11/02/13 3500    Narrative:    HISTORY: Respiratory distress.     COMPARISON: 09/01/2012    TECHNIQUE: AP portable taken at 5:15.    FINDINGS: The lungs are clear. There are no pleural effusions. The  pulmonary vascularity is not congested. The heart is not enlarged.      Impression:     Normal portable chest.       Lynnae Prude, MD   11/02/2013 7:34 AM      .  Medical Decision Making   I am the first provider for this patient.    I reviewed the vital signs, available nursing notes, past medical history, past surgical history, family history and social history.    Vital Signs-Reviewed the patient's vital signs.     Patient Vitals for the past 12 hrs:   BP Temp Pulse Resp   11/02/13 1002 135/84 mmHg - 78  20    11/02/13 0645 - - 70  -   11/02/13 0625 161/93 mmHg - 82  21    11/02/13 0521 162/100 mmHg - - -   11/02/13 0455 209/107 mmHg 97.5 F (36.4 C) 93  18        Pulse Oximetry Interpretation: Normal 100% on RA    EKG: 0506, NSR with rate of 81, Nl intervals, LVH, no ST elevation, no significant change compared to 12/06/12    ED Course:     9:20 AM - Sx improved, Discussed all results with pt, counseled on Dx, treatment plan, and return precautions. I advised continue Miralax, explained no narcotic pain medicine for [place] as worsens constipation, pt has enough Zofran and takes zantac at home.    Pt agrees with plan and is stable for discharge.       Provider Notes: MDM: Pt with abdominal pain and has had a work up for this in the past including endoscopy and [place] but not in a while- the pain is reproducible in the RUQ so did [place] and negative, do not think ACS based on the symptoms and exam, do not think PE as no hypoxia, tachycardia, sob, hemoptysis, calf tenderness or hx of DVT/PE, do not think dissection, no evidence pneumonia or ptx, pt was diagnosed with a moderate amount of stool in right colon earlier this week but has been passing stool- do not think SBO  at this time- advised continue Miralax, explained no narcotic pain medicine for [place] as worsens constipation, pt has enough Zofran and takes zantac at home.       Doctor's Notes     Throughout the stay in the Emergency Department, questions and concerns surrounding pain control, care plans, diagnostic studies, effects of medications administered or prescribed, and future prognostic dilemmas were assessed and addressed.    ROS addendum: The patient and/or family was asked if they had any other complaints or concerns that we could address today and nothing of significance was noted.     _______________________________  Medical DeMedical Decision Makingcision Stacy Gardner  Attestations:    This note is prepared by [employer], acting as Scribe for Gweneth Dimitri, DO.     Gweneth Dimitri, DO: The scribe's documentation has been prepared under my direction and personally reviewed by me in its entirety.  I confirm that the note above accurately reflects all work, treatment, procedures, and medical decision making performed by me.        Madaline Brilliant, DO  11/02/13 1201

## 2013-11-11 DIAGNOSIS — E870 Hyperosmolality and hypernatremia: Secondary | ICD-10-CM | POA: Insufficient documentation

## 2013-11-11 DIAGNOSIS — R202 Paresthesia of skin: Secondary | ICD-10-CM | POA: Insufficient documentation

## 2013-11-14 ENCOUNTER — Ambulatory Visit (INDEPENDENT_AMBULATORY_CARE_PROVIDER_SITE_OTHER): Payer: PRIVATE HEALTH INSURANCE | Admitting: Internal Medicine

## 2013-11-21 ENCOUNTER — Ambulatory Visit (INDEPENDENT_AMBULATORY_CARE_PROVIDER_SITE_OTHER): Payer: Commercial Managed Care - HMO | Admitting: Internal Medicine

## 2013-11-21 ENCOUNTER — Encounter (INDEPENDENT_AMBULATORY_CARE_PROVIDER_SITE_OTHER): Payer: Self-pay | Admitting: Internal Medicine

## 2013-11-21 ENCOUNTER — Ambulatory Visit (INDEPENDENT_AMBULATORY_CARE_PROVIDER_SITE_OTHER): Payer: PRIVATE HEALTH INSURANCE | Admitting: Internal Medicine

## 2013-11-21 VITALS — BP 163/104 | HR 92 | Temp 97.6°F | Resp 16 | Wt 207.0 lb

## 2013-11-21 DIAGNOSIS — Z9889 Other specified postprocedural states: Secondary | ICD-10-CM

## 2013-11-21 DIAGNOSIS — I10 Essential (primary) hypertension: Secondary | ICD-10-CM | POA: Insufficient documentation

## 2013-11-21 DIAGNOSIS — R0789 Other chest pain: Secondary | ICD-10-CM

## 2013-11-21 DIAGNOSIS — K219 Gastro-esophageal reflux disease without esophagitis: Secondary | ICD-10-CM | POA: Insufficient documentation

## 2013-11-21 MED ORDER — DILTIAZEM HCL ER BEADS 240 MG PO CP24
240.0000 mg | ORAL_CAPSULE | Freq: Every day | ORAL | Status: DC
Start: 2013-11-21 — End: 2014-03-15

## 2013-11-21 NOTE — Progress Notes (Signed)
Subjective:       Patient ID: Amber [name] is a 63 y.o. female.    HPI  Pt is here to establish care and c/o  1. HTN x several yrs,  elevated BP x last week after cervical spinal surgery, and increased HR, +palp occ.   +hx palp, ?Holter 1 yr ago reportedly normal. +CP x yesterday, non exertional. No sob. Had nuclear cardiac stress test 3-4 months ago. Went to ER, reported CXR, EKG and labs normal.   Weight stable,   2. GERD, stable on med, no dysphagia,   3. S/p cervical spinal surgery for HNP x 2. Pre op numbness in extremities have resolved.   The following portions of the patient's history were reviewed and updated as appropriate: allergies, current medications, past family history, past medical history, past social history, past surgical history and problem list.    Review of Systems   Constitutional: Negative for fever, chills, appetite change and unexpected weight change.   Eyes: Negative for visual disturbance.   Respiratory: Negative for shortness of breath and wheezing.    Cardiovascular: Positive for chest pain.   Gastrointestinal: Negative for nausea, vomiting, abdominal pain, diarrhea and constipation.   Genitourinary: Negative for dysuria and frequency.   Skin: Negative for rash.   Neurological: Negative for weakness and numbness.           Objective:    Physical Exam   Constitutional: She is oriented to person, place, and time. She appears well-developed. No distress.   HENT:   Mouth/Throat: Oropharynx is clear and moist. No oropharyngeal exudate.   Eyes: Conjunctivae normal are normal. No scleral icterus.   Neck: Neck supple. No JVD present. Carotid bruit is not present. No thyromegaly present.        In soft cervical collar   Cardiovascular: Normal rate, regular rhythm and normal heart sounds.  Exam reveals no gallop and no friction rub.    No murmur heard.  Pulmonary/Chest: Effort normal and breath sounds normal. No respiratory distress. She has no rales. She exhibits tenderness.         +tenderness ant chest with palpation   Abdominal: Soft. Bowel sounds are normal. She exhibits no distension. There is no tenderness. There is no rebound and no guarding.   Musculoskeletal: She exhibits no edema.   Neurological: She is alert and oriented to person, place, and time.        No focal weakness           Assessment:       1. Unspecified essential hypertension  diltiazem (TIAZAC) 240 MG 24 hr capsule   2. Non-cardiac chest pain     3. S/P spinal surgery     4. Esophageal reflux             Plan:       Medications and/or tests ordered as noted above.  Increase diltiazem to 240mg  daily  Monitor BP  Continue other meds  Increase oxycodone/APAP to 1-2 tabs qid prn.   F/u 2 weeks.

## 2013-11-29 ENCOUNTER — Encounter (INDEPENDENT_AMBULATORY_CARE_PROVIDER_SITE_OTHER): Payer: Self-pay | Admitting: Internal Medicine

## 2013-12-01 ENCOUNTER — Encounter (INDEPENDENT_AMBULATORY_CARE_PROVIDER_SITE_OTHER): Payer: Self-pay | Admitting: Internal Medicine

## 2013-12-05 ENCOUNTER — Ambulatory Visit (INDEPENDENT_AMBULATORY_CARE_PROVIDER_SITE_OTHER): Payer: Commercial Managed Care - HMO | Admitting: Internal Medicine

## 2013-12-05 ENCOUNTER — Encounter (INDEPENDENT_AMBULATORY_CARE_PROVIDER_SITE_OTHER): Payer: Self-pay | Admitting: Internal Medicine

## 2013-12-05 VITALS — BP 146/91 | HR 93 | Temp 98.3°F | Resp 12 | Wt 203.0 lb

## 2013-12-05 DIAGNOSIS — I10 Essential (primary) hypertension: Secondary | ICD-10-CM

## 2013-12-05 DIAGNOSIS — R0789 Other chest pain: Secondary | ICD-10-CM

## 2013-12-05 MED ORDER — TRIAMTERENE-HCTZ 37.5-25 MG PO CAPS
1.0000 | ORAL_CAPSULE | Freq: Every morning | ORAL | Status: DC
Start: 2013-12-05 — End: 2013-12-08

## 2013-12-05 NOTE — Progress Notes (Signed)
Subjective:       Patient ID: Amber [name] is a 63 y.o. female.    HPI  Pt is here for follow up   HTN, home BP 120-150's/80-100's, last week had elevated BP 180/110, +cp, went to [facility] ER, cardiology was consulted.  had neg nuclear cardiac stress test 09/08/13.   ER cxr, leg sonogram, ekg, labs normal. diovan was added, unable to tolerate med and stopped after one dose.     The following portions of the patient's history were reviewed and updated as appropriate: allergies, current medications, past family history, past medical history, past social history, past surgical history and problem list.    Review of Systems   Constitutional: Negative for fever, chills, appetite change and unexpected weight change.   Respiratory: Negative for shortness of breath.    Gastrointestinal: Negative for nausea, vomiting and abdominal pain.   Neurological: Negative for dizziness, syncope and light-headedness.           Objective:    Physical Exam   Constitutional: She is oriented to person, place, and time. No distress.        obese   HENT:   Mouth/Throat: Oropharynx is clear and moist. No oropharyngeal exudate.   Eyes: Conjunctivae normal are normal. No scleral icterus.   Neck: Neck supple. No JVD present. Carotid bruit is not present. No thyromegaly present.   Cardiovascular: Normal rate, regular rhythm, normal heart sounds and intact distal pulses.  Exam reveals no gallop and no friction rub.    No murmur heard.  Pulmonary/Chest: Effort normal and breath sounds normal. No respiratory distress. She has no rales. She exhibits tenderness.   Abdominal: Soft. Bowel sounds are normal. She exhibits no distension.   Musculoskeletal: She exhibits no edema.   Neurological: She is alert and oriented to person, place, and time.           Assessment:       1. Unspecified essential hypertension  triamterene-hydrochlorothiazide (DYAZIDE) 37.5-25 MG per capsule   2. Non-cardiac chest pain             Plan:       Medications and/or tests  ordered as noted above.  Continue diltiazem  D/c Diovan  rx Dyazide  Monitor BP  Increase exercise, weight reduction  F/u 4 weeks.

## 2013-12-05 NOTE — Progress Notes (Signed)
1. Have you self referred yourself since we last saw you? Yes, ER   Refer to care team   Or   Add specialists:

## 2013-12-06 ENCOUNTER — Encounter (INDEPENDENT_AMBULATORY_CARE_PROVIDER_SITE_OTHER): Payer: Self-pay | Admitting: Internal Medicine

## 2013-12-07 ENCOUNTER — Encounter (INDEPENDENT_AMBULATORY_CARE_PROVIDER_SITE_OTHER): Payer: Self-pay | Admitting: Cardiovascular Disease

## 2013-12-08 ENCOUNTER — Encounter (INDEPENDENT_AMBULATORY_CARE_PROVIDER_SITE_OTHER): Payer: Self-pay | Admitting: Cardiovascular Disease

## 2013-12-08 ENCOUNTER — Ambulatory Visit (INDEPENDENT_AMBULATORY_CARE_PROVIDER_SITE_OTHER): Payer: Commercial Managed Care - HMO | Admitting: Cardiovascular Disease

## 2013-12-08 VITALS — BP 152/94 | HR 84 | Resp 16 | Ht 61.0 in | Wt 204.0 lb

## 2013-12-08 DIAGNOSIS — I10 Essential (primary) hypertension: Secondary | ICD-10-CM

## 2013-12-08 MED ORDER — CARVEDILOL 3.125 MG PO TABS
3.1250 mg | ORAL_TABLET | Freq: Two times a day (BID) | ORAL | Status: DC
Start: 2013-12-08 — End: 2014-01-02

## 2013-12-08 NOTE — Progress Notes (Signed)
[facility] - [facility]    Chief Complaint   Patient presents with   . Hypertension         History of Present Illness   Sghe was recently hospitalized post cervical disc surgery and had elevated BP  The cardizem was increased to 240/day but BP readings somewhat high. She did not tolerate triamterene HCT. 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 GERD (gastroesophageal reflux disease)    . Hiatal hernia    . Genital herpes    . TIA (transient ischemic attack)    . Hypertensive disorder    . Hyperlipidemia        Past Surgical History     Past Surgical History   Procedure Date   . Ovary surgery    . Appendectomy    . Fallopian tube surgery    . Spine surgery 11/2013     cervical HNP x 2, Dr. Bufford Buttner       Family History     Family History   Problem Relation Age of Onset   . Cancer Mother 46     breast cancer       Social History     History     Social History   . Marital Status: Single     Spouse Name: N/A     Number of Children: 2   . 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 Smoking status: Never Smoker    . Smokeless tobacco: Never Used   . Alcohol Use: No   . Drug Use: No   . Sexually Active: Not on file     Other Topics Concern   . Not on file     Social History Narrative   . No narrative on file       Allergies     Allergies   Allergen Reactions   . Amoxicillin-Pot Clavulanate      Other reaction(s): gi distress   . Statins      Other reaction(s): gi distress   . Sulfa Antibiotics      Other reaction(s): gi distress       Medications     Current Outpatient Prescriptions on File Prior to Visit   Medication Sig Dispense Refill   . aspirin 81 MG tablet Take 81 mg by mouth daily.       Marland Kitchen diltiazem (TIAZAC) 240 MG 24 hr capsule Take 1 capsule (240 mg total) by mouth daily.  30 capsule  5   . omeprazole  (PRILOSEC) 40 MG capsule Take 40 mg by mouth daily.       Marland Kitchen oxyCODONE-acetaminophen (PERCOCET) 5-325 MG per tablet Take 1 Tab by Mouth Every 4 Hours As Needed for Pain.       . triamterene-hydrochlorothiazide (DYAZIDE) 37.5-25 MG per capsule Take 1 capsule by mouth every morning.  30 capsule  5   . valACYclovir HCL (VALTREX) 500 MG tablet Take 500 mg by mouth as needed.       . valsartan (DIOVAN) 80 MG tablet Take 1 Tab by Mouth Once a Day.           Review of Systems     Constitutional: Negative for  fevers and chills  Skin: No rash or lesions  Respiratory: Negative for cough, wheezing, or hemoptysis  Cardiovascular: as per HPI  Gastrointestinal: Negative for abdominal pain, nausea, vomiting and diarrhea  Musculoskeletal:  No arthritic symptoms  Genitourinary: Negative for dysuria  Otherwise 10 point review of systems is negative.      Physical Exam     Filed Vitals:    12/08/13 1151   BP: 152/94   Pulse: 84   Resp: 16   SpO2: 99%       Body mass index is 38.57 kg/(m^2).    General:  Patient appears their stated age, well-nourished.  Alert and in no apparent distress.  Eyes: No conjunctivitis, no purulent discharge, no lid lag  ENT:  Hearing grossly intact, Nares patent bilaterally, Lips moist, color appropriate for race.  Respiratory: Clear to auscultation and percussion throughout. Respiratory effort unlabored, chest expansion symmetric.    Cardio: Regular rate and rhythm. Normal S1/S2 No carotid bruits or thrills, no JVD.  Extremities: warm, pulses 2+, no peripheral edema  GI: Soft, nondistended, nontender.  No guarding or rebound.  Skin: Color appropriate for race, Skin warm, dry, and intact  Psychiatric: Good insight and judgment, oriented to person, place, and time    Labs     CBC:   WBC   Date/Time Value Range Status   11/02/2013  5:55 AM 5.91  3.50 - 10.80 x10 3/uL Final   09/01/2012  3:03 AM 5.73  3.50 - 10.80 x10 3/uL Final   06/30/2009  4:03 PM 9.12  3.50 - 10.80 /CUMM Final        RBC   Date/Time Value  Range Status   11/02/2013  5:55 AM 4.47  4.20 - 5.40 x10 6/uL Final        Hgb   Date/Time Value Range Status   11/02/2013  5:55 AM 12.9  12.0 - 16.0 g/dL Final        Hematocrit   Date/Time Value Range Status   11/02/2013  5:55 AM 39.6  37.0 - 47.0 % Final        MCV   Date/Time Value Range Status   11/02/2013  5:55 AM 88.6  80.0 - 100.0 fL Final        MCHC   Date/Time Value Range Status   11/02/2013  5:55 AM 32.6  32.0 - 36.0 g/dL Final        RDW   Date/Time Value Range Status   11/02/2013  5:55 AM 14  12 - 15 % Final        Platelets   Date/Time Value Range Status   11/02/2013  5:55 AM 244  140 - 400 x10 3/uL Final       CMP:   Sodium   Date/Time Value Range Status   11/02/2013  5:55 AM 144  136 - 145 mEq/L Final        Potassium   Date/Time Value Range Status   11/02/2013  5:55 AM 4.4  3.5 - 5.1 mEq/L Final        Chloride   Date/Time Value Range Status   11/02/2013  5:55 AM 108* 98 - 107 mEq/L Final        CO2   Date/Time Value Range Status   11/02/2013  5:55 AM 28  22 - 29 mEq/L Final        Glucose   Date/Time Value Range Status   11/02/2013  5:55 AM 95  70 - 100 mg/dL Final  Interpretive Data for Adult Female and Female Population      Indeterminate Range:  100-125 mg/dL      Equal to or greater than 126 mg/dL meets the ADA      guidelines for Diabetes Mellitus diagnosis if symptoms      are present and confirmed by repeat testing.      Random (Non-Fasting)Interpretive Data (Adults):      Equal to or greater than 200 mg/dL meets the ADA      guidelines for Diabetes Mellitus diagnosis if symptoms      are present and confirmed by Fasting Glucose or GTT.        BUN   Date/Time Value Range Status   11/02/2013  5:55 AM 10.0  7.0 - 19.0 mg/dL Final        Protein, Total   Date/Time Value Range Status   11/02/2013  5:55 AM 6.5  6.0 - 8.3 g/dL Final        Alkaline Phosphatase   Date/Time Value Range Status   11/02/2013  5:55 AM 92  40 - 150 U/L Final        AST (SGOT)   Date/Time Value Range Status   11/02/2013  5:55 AM 16  5 - 34 U/L Final         ALT   Date/Time Value Range Status   11/02/2013  5:55 AM 11  0 - 55 U/L Final        Anion Gap   Date/Time Value Range Status   11/02/2013  5:55 AM 8.0  5.0 - 15.0 Final       Lipid Panel   Cholesterol   Date/Time Value Range Status   05/21/2008  7:34 AM 240* 50 - 200 mg/dL Final        Triglycerides   Date/Time Value Range Status   05/21/2008  7:34 AM 76  35 - 135 mg/dL Final        HDL   Date/Time Value Range Status   05/21/2008  7:34 AM 54  35 - 86 mg/dL Final      Relative Risk of Coronary Heart Disease as a function of HDL Cholesterol                          (Average Risk = 1.00)        ---------------------------------------------------               HDL Cholesterol   /       Relative Risk        ---------------------------------------------------             mg/dl                   Women       Men             30                      ----        1.82             35                      ----        1.49             40  1.94        1.22             45                      1.55        1.00             50                      1.25        0.82             55                      1.00        0.67             60                      0.80        0.55             65                      0.64        0.45             70                      0.52        ----        ---------------------------------------------------         EKG   I have reviewed and interpreted the EKG.  The EKG is significant for SR prominent voltage    Assessment and Plan       1. Hypertension  ECG 12 lead (Today)     HTN not well controlled on current RX will add Coreg3.125 mg BID and follow up in 1 month

## 2013-12-09 ENCOUNTER — Encounter (INDEPENDENT_AMBULATORY_CARE_PROVIDER_SITE_OTHER): Payer: Self-pay | Admitting: Internal Medicine

## 2013-12-20 ENCOUNTER — Telehealth (HOSPITAL_BASED_OUTPATIENT_CLINIC_OR_DEPARTMENT_OTHER): Payer: Self-pay

## 2013-12-20 NOTE — Telephone Encounter (Signed)
Sharepoint Assessment Questions    Ht 5\' 4"  (1.626 m) Wt 160 lb (72.576 kg) Body mass index is 27.45 kg/(m^2).    Does the patient have sufficient understanding of English? Yes  If NO, which language?    Is the patient able to provide consent? Yes (If no, fill in below)    Legal Guardian                                    DPOA:  NAME:                                            NAME:    Phone:                                           Phone:      PROCEDURE        Procedure MD: Tye Savoy  Procedure Type: EUS  Procedure Date:  02/12/14     Time:1030    Procedure Check-In Time: 1000    Patient Assessment    Does the patient have an allergy to Fentanyl? No  If yes, what type of reaction?     Does the patient have an allergy to Versed? No  If yes, what type of reaction?     If this is a PEG appointment ask, does the patient have an allergy to PCN? No  If yes, what type of reaction?      If this is an ERCP appointment ask, does the patient have a contrast allergy?  No  If yes, what type of reaction?      Is the patient on Anti-coagulant? No  If yes, what Anti-coag medication:   Prescribing provider:     Does the patient have any bleeding or anti-coagulation disorders? No  If yes What kind?      Is the patient Diabetic?  No  Was patient advised to contact their Prescribing MD for direction? No     Is the patient on Dialysis? No  If yes: is it Peritoneal Dialysis? No    Does the patient use narcotics on a daily basis? No    Have you ever had any problems in the past with sedation? No  If yes What kind?      Does the patient have sleep apnea? No  If yes, does the patient use a sleep apnea machine? No  If yes, was patient instructed to bring their CPAP machine with them? No    Does the patient have a defibrillator? No  If yes, what is the patient's defib device?     Is it difficult to start an IV? No    Does the patient have mobility issues that make it difficult to get on a stretcher? No    Ask the patient: "Are you interested  in hearing about current research studies that you could participate in at the time of your procedure?"  No    Patient Teaching  Who received the teaching - Patient? Yes    Is the patient ready to learn? Yes    Was the topic of stopping iron supplements taught? No    Was the topic of blood thinners  taught?  Yes    Was the topic of diet taught? Yes    Were instructions for taking current medications taught? Yes    Was our transportation policy taught? Yes    Were instructions for the ordered laxative taught? No    Which RX was prescribed? Marland Kitchen    How were the preparation instruction materials delivered?  Verbal: Yes  On Paper: Yes  eCare message: No  Email: No    General Notes:

## 2014-01-02 ENCOUNTER — Ambulatory Visit (INDEPENDENT_AMBULATORY_CARE_PROVIDER_SITE_OTHER): Payer: Commercial Managed Care - HMO | Admitting: Internal Medicine

## 2014-01-02 ENCOUNTER — Encounter (INDEPENDENT_AMBULATORY_CARE_PROVIDER_SITE_OTHER): Payer: Self-pay | Admitting: Internal Medicine

## 2014-01-02 VITALS — BP 160/92 | HR 80 | Temp 98.5°F | Resp 14 | Wt 211.0 lb

## 2014-01-02 DIAGNOSIS — J019 Acute sinusitis, unspecified: Secondary | ICD-10-CM

## 2014-01-02 DIAGNOSIS — I10 Essential (primary) hypertension: Secondary | ICD-10-CM

## 2014-01-02 DIAGNOSIS — M1990 Unspecified osteoarthritis, unspecified site: Secondary | ICD-10-CM

## 2014-01-02 MED ORDER — HYDRALAZINE HCL 25 MG PO TABS
25.0000 mg | ORAL_TABLET | Freq: Three times a day (TID) | ORAL | Status: DC
Start: 2014-01-02 — End: 2014-01-15

## 2014-01-02 MED ORDER — FLUTICASONE PROPIONATE 50 MCG/ACT NA SUSP
2.0000 | Freq: Every day | NASAL | Status: AC
Start: 2014-01-02 — End: ?

## 2014-01-02 MED ORDER — AZITHROMYCIN 250 MG PO TABS
ORAL_TABLET | ORAL | Status: DC
Start: 2014-01-02 — End: 2014-01-15

## 2014-01-02 NOTE — Progress Notes (Signed)
Subjective:       Patient ID: Amber [name] is a 63 y.o. female.    HPI  Pt c/o  1. Sinus congestion, pain x 10, foul odor discharge, no fever, no ST, +slightly cough, +chronic sinus problem. With previous sinus polyp  2. HTN, could not tolerate dyazide, carvedilol (given by card), +nausea, dizziness with carvedilol. Home BP 140's/90's. Also had problem with Diovan  3. DJD knees, saw ortho at [facility], Jillyn Hidden showed DJD, limited ambulation, +cane. Plans to see local ortho in near future.   Requests DMV handicap parking form.   The following portions of the patient's history were reviewed and updated as appropriate: allergies, current medications, past family history, past medical history, past social history, past surgical history and problem list.    Review of Systems   Constitutional: Negative for fever, chills, appetite change and unexpected weight change.   HENT: Positive for congestion and sinus pressure. Negative for sore throat.    Eyes: Negative for visual disturbance.   Respiratory: Negative for shortness of breath and wheezing.    Cardiovascular: Negative for chest pain and palpitations.   Gastrointestinal: Negative for abdominal pain.   Musculoskeletal: Positive for arthralgias. Negative for joint swelling.           Objective:    Physical Exam   Constitutional: She is oriented to person, place, and time. No distress.        obese   HENT:   Right Ear: Tympanic membrane is not erythematous and not bulging.   Left Ear: Tympanic membrane is not erythematous and not bulging.   Nose: Right sinus exhibits maxillary sinus tenderness. Right sinus exhibits no frontal sinus tenderness. Left sinus exhibits maxillary sinus tenderness. Left sinus exhibits no frontal sinus tenderness.   Mouth/Throat: Oropharynx is clear and moist. No oropharyngeal exudate.   Eyes: Conjunctivae normal are normal. No scleral icterus.   Neck: Neck supple. No JVD present. Carotid bruit is not present. No thyromegaly present.    Cardiovascular: Normal rate, regular rhythm and normal heart sounds.  Exam reveals no gallop and no friction rub.    No murmur heard.  Pulmonary/Chest: Effort normal and breath sounds normal. No respiratory distress. She has no rales.   Abdominal: Soft. Bowel sounds are normal. She exhibits no distension. There is no tenderness.   Musculoskeletal: She exhibits tenderness. She exhibits no edema.        +crepitus bilat knees, +tenderness, no swelling.    Lymphadenopathy:     She has no cervical adenopathy.   Neurological: She is alert and oriented to person, place, and time.           Assessment:       1. Acute sinusitis, unspecified  azithromycin (ZITHROMAX Z-PAK) 250 MG tablet    fluticasone (FLONASE) 50 MCG/ACT nasal spray   2. Unspecified essential hypertension  hydrALAZINE (APRESOLINE) 25 MG tablet   3. Degenerative joint disease             Plan:       Medications and/or tests ordered as noted above.  rx zpak, flonase  D/c carvedilol  Continue diltiazem  rx hydralazine 25mg  tid  Monitor BP  Pt to see ortho  F/u 4 weeks.   DMV form completed.

## 2014-01-02 NOTE — Progress Notes (Signed)
1. Have you self referred yourself since we last saw you? no  Refer to care team   Or   Add specialists:

## 2014-01-05 ENCOUNTER — Ambulatory Visit (INDEPENDENT_AMBULATORY_CARE_PROVIDER_SITE_OTHER): Payer: Commercial Managed Care - HMO | Admitting: Cardiovascular Disease

## 2014-01-05 ENCOUNTER — Telehealth (INDEPENDENT_AMBULATORY_CARE_PROVIDER_SITE_OTHER): Payer: Self-pay | Admitting: Internal Medicine

## 2014-01-05 NOTE — Telephone Encounter (Signed)
TC with pt. Pt reported HR increased with hydralazine, does not like the side effects. And does not want to try any other med or increase diltiazem.  She is aware that her current dose of diltiazem alone is inadequate for her BP control. However with her intolerances for other BP meds, we don't have much options.   She will focus on exercise, lose weight

## 2014-01-08 DIAGNOSIS — R079 Chest pain, unspecified: Secondary | ICD-10-CM

## 2014-01-08 DIAGNOSIS — R9431 Abnormal electrocardiogram [ECG] [EKG]: Secondary | ICD-10-CM | POA: Insufficient documentation

## 2014-01-09 ENCOUNTER — Encounter (INDEPENDENT_AMBULATORY_CARE_PROVIDER_SITE_OTHER): Payer: Self-pay | Admitting: Internal Medicine

## 2014-01-15 ENCOUNTER — Ambulatory Visit (INDEPENDENT_AMBULATORY_CARE_PROVIDER_SITE_OTHER): Payer: Commercial Managed Care - HMO | Admitting: Internal Medicine

## 2014-01-15 ENCOUNTER — Other Ambulatory Visit (INDEPENDENT_AMBULATORY_CARE_PROVIDER_SITE_OTHER): Payer: Self-pay | Admitting: Internal Medicine

## 2014-01-15 ENCOUNTER — Encounter (INDEPENDENT_AMBULATORY_CARE_PROVIDER_SITE_OTHER): Payer: Self-pay | Admitting: Internal Medicine

## 2014-01-15 VITALS — BP 143/91 | HR 80 | Temp 98.2°F | Resp 16 | Wt 210.0 lb

## 2014-01-15 DIAGNOSIS — F411 Generalized anxiety disorder: Secondary | ICD-10-CM

## 2014-01-15 DIAGNOSIS — R200 Anesthesia of skin: Secondary | ICD-10-CM

## 2014-01-15 DIAGNOSIS — R209 Unspecified disturbances of skin sensation: Secondary | ICD-10-CM

## 2014-01-15 DIAGNOSIS — I10 Essential (primary) hypertension: Secondary | ICD-10-CM

## 2014-01-15 DIAGNOSIS — K219 Gastro-esophageal reflux disease without esophagitis: Secondary | ICD-10-CM

## 2014-01-15 DIAGNOSIS — R202 Paresthesia of skin: Secondary | ICD-10-CM

## 2014-01-15 MED ORDER — ALPRAZOLAM 0.5 MG PO TABS
0.5000 mg | ORAL_TABLET | Freq: Two times a day (BID) | ORAL | Status: DC | PRN
Start: 2014-01-15 — End: 2014-05-01

## 2014-01-15 NOTE — Progress Notes (Signed)
Subjective:       Patient ID: Amber [name] is a 63 y.o. female.    HPI  Pt is here for follow up   1. Was admitted at [facility] last week for chest pain. Pt underwent card angiogram that was neg.  went back to ER few days later for recurrent cp, abd pain, arms and legs tingling.  abd sonogram, CXR, labs neg at ER.   2. +anxiety, had taken either valium or xanax prn. Worse last week, declined SSRI.    3. "knot" on chest, no dysphagia, no odynophagia, +Hx GERD, last EGD >43yr ago at [facility], no regurgitation sx.   4. HTN, home BP 100-140's/70-90's, +hx intolerance to multiple meds.   5. Tingling, numbness, sides of legs, arms x 1 week, no weakness, s/p cervical spinal surgery 1/15, Dr. Geri Seminole.   Requests vitamin D check.     The following portions of the patient's history were reviewed and updated as appropriate: allergies, current medications, past family history, past medical history, past social history, past surgical history and problem list.    Review of Systems   Constitutional: Negative for fever, chills and appetite change.   Respiratory: Negative for cough and shortness of breath.    Cardiovascular: Positive for chest pain. Negative for palpitations.   Gastrointestinal: Positive for abdominal pain. Negative for nausea, vomiting, diarrhea, constipation and blood in stool.   Genitourinary: Negative for dysuria.   Neurological: Negative for syncope and light-headedness.     BP 143/91  Pulse 80  Temp 98.2 F (36.8 C) (Oral)  Resp 16  Wt 95.255 kg (210 lb)       Objective:    Physical Exam   Constitutional: She is oriented to person, place, and time. No distress.        obese   HENT:   Mouth/Throat: Oropharynx is clear and moist. No oropharyngeal exudate.   Eyes: Conjunctivae normal are normal. No scleral icterus.   Neck: Neck supple. No JVD present. Carotid bruit is not present. No thyromegaly present.   Cardiovascular: Normal rate, regular rhythm and normal heart sounds.  Exam reveals no gallop and  no friction rub.    No murmur heard.  Pulmonary/Chest: Effort normal and breath sounds normal. No respiratory distress. She has no rales. She exhibits tenderness.   Abdominal: Soft. Bowel sounds are normal. She exhibits no distension. There is no hepatosplenomegaly. There is tenderness in the epigastric area. There is no rebound and no guarding.   Musculoskeletal: She exhibits no edema.   Neurological: She is alert and oriented to person, place, and time.           Assessment:       1. Unspecified essential hypertension     2. Anxiety state, unspecified  Ambulatory referral to Psychology    ALPRAZolam (XANAX) 0.5 MG tablet   3. Esophageal reflux  Ambulatory referral to Gastroenterology    FL Upper GI Air Contrast W Kub   4. Numbness and tingling  Ambulatory referral to Neurology    Vitamin B12    Vitamin D 25 hydroxy           Plan:       Medications and/or tests ordered as noted above.  Referred pt to Dr. Abbe Amsterdam, psychologist, for evaluation  rx Xanax prn  Referred pt to [name], GI, for evaluation   UGI series ordered  Referred pt to Dr. Delane Ginger, neuro, for evaluation  Monitor BP  Continue meds  F/u 4  weeks.

## 2014-01-16 ENCOUNTER — Ambulatory Visit (INDEPENDENT_AMBULATORY_CARE_PROVIDER_SITE_OTHER): Payer: Commercial Managed Care - HMO | Admitting: Internal Medicine

## 2014-01-17 ENCOUNTER — Encounter (INDEPENDENT_AMBULATORY_CARE_PROVIDER_SITE_OTHER): Payer: Self-pay | Admitting: Internal Medicine

## 2014-01-17 ENCOUNTER — Other Ambulatory Visit (INDEPENDENT_AMBULATORY_CARE_PROVIDER_SITE_OTHER): Payer: Self-pay | Admitting: Internal Medicine

## 2014-01-17 DIAGNOSIS — E559 Vitamin D deficiency, unspecified: Secondary | ICD-10-CM

## 2014-01-17 LAB — VITAMIN D-25 HYDROXY (D2/D3/TOTAL)
25-Hydroxy D2: <4 ng/mL
Vitamin D, 25-OH, D3: 11 ng/mL
Vitamin D, 25-OH, Total: 11 ng/mL — ABNORMAL LOW (ref 30–100)

## 2014-01-17 LAB — VITAMIN B12: Vitamin B-12: 302 pg/mL (ref 200–1100)

## 2014-01-17 MED ORDER — VITAMIN D (ERGOCALCIFEROL) 1.25 MG (50000 UT) PO CAPS
50000.0000 [IU] | ORAL_CAPSULE | ORAL | Status: DC
Start: 2014-01-17 — End: 2014-11-07

## 2014-02-02 ENCOUNTER — Ambulatory Visit (INDEPENDENT_AMBULATORY_CARE_PROVIDER_SITE_OTHER): Payer: Commercial Managed Care - HMO | Admitting: Internal Medicine

## 2014-02-06 ENCOUNTER — Encounter (INDEPENDENT_AMBULATORY_CARE_PROVIDER_SITE_OTHER): Payer: Self-pay | Admitting: Internal Medicine

## 2014-02-06 ENCOUNTER — Ambulatory Visit (INDEPENDENT_AMBULATORY_CARE_PROVIDER_SITE_OTHER): Payer: Commercial Managed Care - HMO | Admitting: Internal Medicine

## 2014-02-06 VITALS — BP 134/85 | HR 84 | Temp 98.3°F | Resp 14 | Wt 210.0 lb

## 2014-02-06 DIAGNOSIS — R1030 Lower abdominal pain, unspecified: Secondary | ICD-10-CM

## 2014-02-06 DIAGNOSIS — N3281 Overactive bladder: Secondary | ICD-10-CM | POA: Insufficient documentation

## 2014-02-06 DIAGNOSIS — N390 Urinary tract infection, site not specified: Secondary | ICD-10-CM

## 2014-02-06 DIAGNOSIS — R109 Unspecified abdominal pain: Secondary | ICD-10-CM

## 2014-02-06 DIAGNOSIS — N318 Other neuromuscular dysfunction of bladder: Secondary | ICD-10-CM

## 2014-02-06 LAB — POCT URINALYSIS DIPSTIX (10)(MULTI-TEST)
Bilirubin, UA POCT: NEGATIVE
Blood, UA POCT: NEGATIVE
Glucose, UA POCT: NEGATIVE mg/dL
Ketones, UA POCT: NEGATIVE mg/dL
Nitrite, UA POCT: NEGATIVE
POCT Leukocytes, UA: NEGATIVE
POCT Spec Gravity, UA: 1.01 (ref 1.001–1.035)
POCT pH, UA: 6 (ref 5–8)
Protein, UA POCT: NEGATIVE mg/dL
Urobilinogen, UA: 0.2 mg/dL

## 2014-02-06 MED ORDER — SOLIFENACIN SUCCINATE 5 MG PO TABS
5.0000 mg | ORAL_TABLET | Freq: Every day | ORAL | Status: DC
Start: 2014-02-06 — End: 2014-11-07

## 2014-02-06 MED ORDER — CIPROFLOXACIN HCL 500 MG PO TABS
500.0000 mg | ORAL_TABLET | Freq: Two times a day (BID) | ORAL | Status: AC
Start: 2014-02-06 — End: 2014-02-16

## 2014-02-06 NOTE — Progress Notes (Signed)
1. Have you self referred yourself since we last saw you? no  Refer to care team   Or   Add specialists:

## 2014-02-06 NOTE — Progress Notes (Signed)
Subjective:       Patient ID: Amber [name] is a 63 y.o. female.    HPI  Pt c/o  Abdominal discomfort/pressure x 2d, no dysuria, +frequent urination, no gross hematuria, no fever. No hx freq uti, no hx kidney stones. BM normal, no vag bleeding.   +hx of urinary frequency x few yrs, though usually without the abd pain. Reportedly on an antispasmodic med in the past.   The following portions of the patient's history were reviewed and updated as appropriate: allergies, current medications, past family history, past medical history, past social history, past surgical history and problem list.    Review of Systems   Constitutional: Negative for fever, chills and appetite change.   Respiratory: Negative for shortness of breath.    Cardiovascular: Negative for chest pain.   Gastrointestinal: Positive for abdominal pain. Negative for nausea, vomiting, diarrhea and blood in stool.   Genitourinary: Positive for urgency and frequency. Negative for dysuria and hematuria.     BP 134/85  Pulse 84  Temp 98.3 F (36.8 C) (Oral)  Resp 14  Wt 95.255 kg (210 lb)       Objective:    Physical Exam   Constitutional: No distress.        obese   HENT:   Mouth/Throat: Oropharynx is clear and moist. No oropharyngeal exudate.   Eyes: Conjunctivae normal are normal. No scleral icterus.   Neck: Neck supple. No JVD present. Carotid bruit is not present. No thyromegaly present.   Cardiovascular: Normal rate, regular rhythm and normal heart sounds.  Exam reveals no gallop and no friction rub.    No murmur heard.  Pulmonary/Chest: Effort normal and breath sounds normal. No respiratory distress. She has no rales.   Abdominal: Soft. Bowel sounds are normal. She exhibits no distension. There is no hepatosplenomegaly. There is no tenderness. There is no rebound, no guarding and no CVA tenderness.   Musculoskeletal: She exhibits no edema.   Neurological: She is alert.     POCT urine dipstick neg      Assessment:       1. Lower abdominal pain,  unspecified laterality  POCT urinalysis dipstick    Urine culture   2. Urinary tract infection, site not specified  ciprofloxacin (CIPRO) 500 MG tablet   3. OAB (overactive bladder)  solifenacin (VESICARE) 5 MG tablet           Plan:       Medications and/or tests ordered as noted above.  Urine cx sent  Empiric Cipro  rx vesicare  F/u 4 weeks.

## 2014-02-12 ENCOUNTER — Encounter (HOSPITAL_BASED_OUTPATIENT_CLINIC_OR_DEPARTMENT_OTHER): Payer: No Typology Code available for payment source | Admitting: Gastroenterology

## 2014-02-13 ENCOUNTER — Telehealth (INDEPENDENT_AMBULATORY_CARE_PROVIDER_SITE_OTHER): Payer: Self-pay | Admitting: Internal Medicine

## 2014-02-13 NOTE — Telephone Encounter (Signed)
No, I think she may have tried Detrol in the past already.  Keep rx as is for Vesicare.

## 2014-02-15 ENCOUNTER — Ambulatory Visit (INDEPENDENT_AMBULATORY_CARE_PROVIDER_SITE_OTHER): Payer: Commercial Managed Care - HMO | Admitting: Internal Medicine

## 2014-02-15 ENCOUNTER — Other Ambulatory Visit (INDEPENDENT_AMBULATORY_CARE_PROVIDER_SITE_OTHER): Payer: Self-pay

## 2014-02-16 ENCOUNTER — Other Ambulatory Visit (INDEPENDENT_AMBULATORY_CARE_PROVIDER_SITE_OTHER): Payer: Self-pay

## 2014-02-16 ENCOUNTER — Encounter (INDEPENDENT_AMBULATORY_CARE_PROVIDER_SITE_OTHER): Payer: Self-pay | Admitting: Internal Medicine

## 2014-02-19 ENCOUNTER — Encounter (INDEPENDENT_AMBULATORY_CARE_PROVIDER_SITE_OTHER): Payer: Self-pay | Admitting: Internal Medicine

## 2014-02-19 ENCOUNTER — Ambulatory Visit (INDEPENDENT_AMBULATORY_CARE_PROVIDER_SITE_OTHER): Payer: Commercial Managed Care - HMO | Admitting: Internal Medicine

## 2014-02-19 VITALS — BP 138/85 | HR 86 | Temp 97.9°F | Resp 12 | Wt 211.0 lb

## 2014-02-19 DIAGNOSIS — N3281 Overactive bladder: Secondary | ICD-10-CM

## 2014-02-19 DIAGNOSIS — N318 Other neuromuscular dysfunction of bladder: Secondary | ICD-10-CM

## 2014-02-19 DIAGNOSIS — L659 Nonscarring hair loss, unspecified: Secondary | ICD-10-CM

## 2014-02-19 DIAGNOSIS — I10 Essential (primary) hypertension: Secondary | ICD-10-CM

## 2014-02-19 DIAGNOSIS — E559 Vitamin D deficiency, unspecified: Secondary | ICD-10-CM | POA: Insufficient documentation

## 2014-02-19 DIAGNOSIS — A6000 Herpesviral infection of urogenital system, unspecified: Secondary | ICD-10-CM

## 2014-02-19 MED ORDER — VALACYCLOVIR HCL 500 MG PO TABS
500.0000 mg | ORAL_TABLET | Freq: Every day | ORAL | Status: DC
Start: 2014-02-19 — End: 2015-03-01

## 2014-02-19 NOTE — Progress Notes (Signed)
Subjective:       Patient ID: Amber [name] is a 63 y.o. female.    HPI  patient is here for follow-up visit.  1.  HTN, home BP, 120-130's/80's.  No chest pain or shortness of breath  2.  OAB, has not yet started med, +urinary freq, no pressure or pain.  3.  Vitamin D def, taking supplement  4. C/o Thinning hair x 6 months, weight stable,   5.  Genital herpes, stable.  Needs refills.  The following portions of the patient's history were reviewed and updated as appropriate: allergies, current medications, past family history, past medical history, past social history, past surgical history and problem list.    Review of Systems   Constitutional: Negative for fever, chills, appetite change and unexpected weight change.   Eyes: Negative for visual disturbance.   Respiratory: Negative for shortness of breath.    Cardiovascular: Negative for chest pain and palpitations.   Gastrointestinal: Negative for nausea, vomiting and abdominal pain.   Genitourinary: Positive for frequency. Negative for dysuria and hematuria.   Neurological: Negative for light-headedness and headaches.     BP 138/85  Pulse 86  Temp 97.9 F (36.6 C) (Oral)  Resp 12  Wt 95.709 kg (211 lb)       Objective:    Physical Exam   Constitutional: No distress.        Obese   HENT:   Mouth/Throat: Oropharynx is clear and moist. No oropharyngeal exudate.   Eyes: Conjunctivae normal are normal. No scleral icterus.   Neck: Neck supple. No JVD present. Carotid bruit is not present. No thyromegaly present.   Cardiovascular: Normal rate, regular rhythm, normal heart sounds and intact distal pulses.  Exam reveals no gallop and no friction rub.    No murmur heard.  Pulmonary/Chest: Effort normal and breath sounds normal. No respiratory distress. She has no rales.   Abdominal: Soft. Bowel sounds are normal. She exhibits no distension. There is no tenderness. There is no rebound and no guarding.   Musculoskeletal: She exhibits no edema.   Neurological: She is  alert.   Skin: No rash noted.        Scalp hair appears thinning, but no focal alopecia.           Assessment:       1. Unspecified essential hypertension     2. Unspecified vitamin D deficiency     3. OAB (overactive bladder)     4. Genital herpes  valACYclovir HCL (VALTREX) 500 MG tablet   5. Thinning hair  TSH    Ferritin    IRON PROFILE          Plan:       Prescription refills.  Continue medication.  Patient to start Vesicare as prescribed  Monitor blood pressure  Weight reduction.  Follow-up 3 months.

## 2014-02-19 NOTE — Progress Notes (Signed)
1. Have you self referred yourself since we last saw you? no  Refer to care team   Or   Add specialists:

## 2014-02-21 ENCOUNTER — Ambulatory Visit (INDEPENDENT_AMBULATORY_CARE_PROVIDER_SITE_OTHER): Payer: Commercial Managed Care - HMO

## 2014-02-21 DIAGNOSIS — Z029 Encounter for administrative examinations, unspecified: Secondary | ICD-10-CM

## 2014-02-21 NOTE — Progress Notes (Signed)
Pt present today for [facility].

## 2014-02-22 ENCOUNTER — Encounter (INDEPENDENT_AMBULATORY_CARE_PROVIDER_SITE_OTHER): Payer: Self-pay | Admitting: Internal Medicine

## 2014-02-22 LAB — TSH: TSH: 1.1 mIU/L (ref 0.40–4.50)

## 2014-02-22 LAB — IRON PROFILE
Iron Saturation: 13 % (ref 11–50)
Iron: 51 ug/dL (ref 45–160)
TIBC: 385 ug/dL (ref 250–450)

## 2014-02-22 LAB — FERRITIN: Ferritin: 27 ng/mL (ref 20–288)

## 2014-02-28 ENCOUNTER — Encounter (INDEPENDENT_AMBULATORY_CARE_PROVIDER_SITE_OTHER): Payer: Self-pay | Admitting: Internal Medicine

## 2014-03-01 ENCOUNTER — Encounter (INDEPENDENT_AMBULATORY_CARE_PROVIDER_SITE_OTHER): Payer: Self-pay | Admitting: Internal Medicine

## 2014-03-01 ENCOUNTER — Ambulatory Visit (INDEPENDENT_AMBULATORY_CARE_PROVIDER_SITE_OTHER): Payer: Commercial Managed Care - HMO | Admitting: Internal Medicine

## 2014-03-01 VITALS — BP 135/87 | HR 99 | Temp 98.4°F | Resp 14 | Wt 209.0 lb

## 2014-03-01 DIAGNOSIS — K219 Gastro-esophageal reflux disease without esophagitis: Secondary | ICD-10-CM

## 2014-03-01 DIAGNOSIS — R109 Unspecified abdominal pain: Secondary | ICD-10-CM

## 2014-03-01 DIAGNOSIS — F411 Generalized anxiety disorder: Secondary | ICD-10-CM | POA: Insufficient documentation

## 2014-03-01 DIAGNOSIS — I10 Essential (primary) hypertension: Secondary | ICD-10-CM

## 2014-03-01 NOTE — Progress Notes (Signed)
Subjective:       Patient ID: Amber [name] is a 63 y.o. female.    HPI  patient is here for a follow-up visit post recent ER visit  1.  Went to ER few days ago due to chest pain, elevated BP, abd pain, +nausea, no vomiting. Sx improved. No cramps, no bloating, BM ok. CT abd/pelvis 6/14 no acute problem, abdominal sonogram in March 2015 was normal.  Upper gastrointestinal series done in March 2015 show mild gastroesophageal reflux.  Patient reports a history of chronic intermittent abdominal pain over many years, always with a negative previous workup.  Had a negative colonoscopy about a year ago  EKG, labs, normal. Neg nuclear stress test 2/14. Normal card cath 3/15.   Saw [name], GI, yesterday, scheduled for EGD next week.   2.  HTN, home BP ok  3.  GERD, compliant with med, no change in diet.   4.  Anxiety, occ xanax, was on Paxil several yrs ago, did not tolerate side effects.. Has not seen psych as referred. Does not want to take med for now.   The following portions of the patient's history were reviewed and updated as appropriate: allergies, current medications, past family history, past medical history, past social history, past surgical history and problem list.    Review of Systems   Constitutional: Negative for fever, chills, appetite change and unexpected weight change.   Respiratory: Negative for shortness of breath.    Cardiovascular: Negative for palpitations.   Gastrointestinal: Positive for abdominal pain. Negative for vomiting, diarrhea and constipation.   Genitourinary: Negative for dysuria, frequency and hematuria.   Neurological: Negative for syncope.     BP 135/87  Pulse 99  Temp 98.4 F (36.9 C) (Oral)  Resp 14  Wt 94.802 kg (209 lb)        Objective:    Physical Exam   Constitutional: No distress.        Obese   HENT:   Mouth/Throat: Oropharynx is clear and moist. No oropharyngeal exudate.   Eyes: Conjunctivae normal are normal. No scleral icterus.   Neck: Neck supple. No JVD  present. Carotid bruit is not present. No thyromegaly present.   Cardiovascular: Normal rate, regular rhythm and normal heart sounds.  Exam reveals no gallop and no friction rub.    No murmur heard.  Pulmonary/Chest: Effort normal and breath sounds normal. No respiratory distress. She has no rales.   Abdominal: Soft. Bowel sounds are normal. She exhibits no distension and no mass. There is tenderness. There is no rebound and no guarding.        Mild tenderness in the upper quadrants   Musculoskeletal: She exhibits no edema.   Neurological: She is alert.           Assessment:       1. Unspecified essential hypertension    2. Abdominal  pain, other specified site    3. Esophageal reflux    4. Anxiety state, unspecified      Suspect anxiety is a significant factor in her sx.       Plan:       Discussed with patient about medication for anxiety.  The patient declined.  Patient is to see the psychologist as recommended.  Continuous and excess needed.  Increase in oral fibers intake and oral hydration.  Follow-up with [name] as scheduled.  Continue other medications.  Monitor blood pressure  Follow-up 3 months or sooner as needed

## 2014-03-01 NOTE — Progress Notes (Signed)
1. Have you self referred yourself since we last saw you? no  Refer to care team   Or   Add specialists:

## 2014-03-05 ENCOUNTER — Encounter (INDEPENDENT_AMBULATORY_CARE_PROVIDER_SITE_OTHER): Payer: Self-pay | Admitting: Internal Medicine

## 2014-03-08 ENCOUNTER — Encounter (INDEPENDENT_AMBULATORY_CARE_PROVIDER_SITE_OTHER): Payer: Self-pay | Admitting: Internal Medicine

## 2014-03-10 ENCOUNTER — Emergency Department: Payer: Commercial Managed Care - HMO

## 2014-03-10 ENCOUNTER — Emergency Department
Admission: EM | Admit: 2014-03-10 | Discharge: 2014-03-10 | Disposition: A | Discharge status: Home / Self Care | Payer: Commercial Managed Care - HMO | Attending: Emergency Medicine | Admitting: Emergency Medicine

## 2014-03-10 DIAGNOSIS — Z8673 Personal history of transient ischemic attack (TIA), and cerebral infarction without residual deficits: Secondary | ICD-10-CM | POA: Insufficient documentation

## 2014-03-10 DIAGNOSIS — K219 Gastro-esophageal reflux disease without esophagitis: Secondary | ICD-10-CM | POA: Insufficient documentation

## 2014-03-10 DIAGNOSIS — R202 Paresthesia of skin: Secondary | ICD-10-CM

## 2014-03-10 DIAGNOSIS — R209 Unspecified disturbances of skin sensation: Secondary | ICD-10-CM | POA: Insufficient documentation

## 2014-03-10 DIAGNOSIS — I10 Essential (primary) hypertension: Secondary | ICD-10-CM | POA: Insufficient documentation

## 2014-03-10 DIAGNOSIS — E785 Hyperlipidemia, unspecified: Secondary | ICD-10-CM | POA: Insufficient documentation

## 2014-03-10 DIAGNOSIS — R200 Anesthesia of skin: Secondary | ICD-10-CM

## 2014-03-10 LAB — COMPREHENSIVE METABOLIC PANEL
ALT: 12 U/L (ref 0–55)
AST (SGOT): 12 U/L (ref 5–34)
Albumin/Globulin Ratio: 1 (ref 0.9–2.2)
Albumin: 3.6 g/dL (ref 3.5–5.0)
Alkaline Phosphatase: 100 U/L (ref 37–106)
Anion Gap: 11 (ref 5.0–15.0)
BUN: 7 mg/dL (ref 7.0–19.0)
Bilirubin, Total: 0.4 mg/dL (ref 0.2–1.2)
CO2: 24 mEq/L (ref 22–29)
Calcium: 9.7 mg/dL (ref 8.5–10.5)
Chloride: 108 mEq/L (ref 100–111)
Creatinine: 0.8 mg/dL (ref 0.6–1.0)
Globulin: 3.5 g/dL (ref 2.0–3.6)
Glucose: 120 mg/dL — ABNORMAL HIGH (ref 70–100)
Potassium: 3.7 mEq/L (ref 3.5–5.1)
Protein, Total: 7.1 g/dL (ref 6.0–8.3)
Sodium: 143 mEq/L (ref 136–145)

## 2014-03-10 LAB — CBC AND DIFFERENTIAL
Basophils Absolute Automated: 0.01 10*3/uL (ref 0.00–0.20)
Basophils Automated: 0 %
Eosinophils Absolute Automated: 0.03 10*3/uL (ref 0.00–0.70)
Eosinophils Automated: 0 %
Hematocrit: 42.9 % (ref 37.0–47.0)
Hgb: 14.3 g/dL (ref 12.0–16.0)
Immature Granulocytes Absolute: 0 10*3/uL
Immature Granulocytes: 0 %
Lymphocytes Absolute Automated: 1.36 10*3/uL (ref 0.50–4.40)
Lymphocytes Automated: 22 %
MCH: 28.4 pg (ref 28.0–32.0)
MCHC: 33.3 g/dL (ref 32.0–36.0)
MCV: 85.3 fL (ref 80.0–100.0)
MPV: 8.9 fL — ABNORMAL LOW (ref 9.4–12.3)
Monocytes Absolute Automated: 0.55 10*3/uL (ref 0.00–1.20)
Monocytes: 9 %
Neutrophils Absolute: 4.27 10*3/uL (ref 1.80–8.10)
Neutrophils: 69 %
Nucleated RBC: 0 /100 WBC (ref 0–1)
Platelets: 251 10*3/uL (ref 140–400)
RBC: 5.03 10*6/uL (ref 4.20–5.40)
RDW: 15 % (ref 12–15)
WBC: 6.22 10*3/uL (ref 3.50–10.80)

## 2014-03-10 LAB — HEMOLYSIS INDEX: Hemolysis Index: 14 (ref 0–18)

## 2014-03-10 LAB — GFR: EGFR: >60
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
## 2014-03-10 NOTE — ED Provider Notes (Signed)
EMERGENCY DEPARTMENT HISTORY AND PHYSICAL EXAM     Physician/Midlevel provider first contact with patient: 03/10/14 1204         Date: 03/10/2014  Patient Name: Amber [name]    History of Presenting Illness     Chief Complaint   Patient presents with   . Numbness       History Provided By: Patient  Chief Complaint: Numbness  Onset: 1 week  Timing: intermittent   Location: L side of body  Quality: decreased sensation  Severity:  moderate  Modifying Factors: none reported  Associated Symptoms: tingling, slight HA x weeks    Additional History: Amber [name] is a 63 y.o. female with history of HTN, Hyperlipdemia, and possible TIA who presents to the ED via EMS complaining of intermittent numbness and tingling on the left side of her body for the past week. She reports being admitted to [facility] for same symptoms 1 week ago. Patient states MRI/MRA and CT scan was done during that admission which were "normal" though she does not have record on her. They told her that she may have had a TIA. Patient states she feels similar to how she did last week when at [facility]. She states they are going to have her f/u with neurology as an outpatient though she has not been to the appointment yet. Pt also reporting inflammation of the esophagus and stomach done by a recent endoscopy. Patient denies chest pain, diarrhea, vomiting, fevers, unusual stresses, slurred speech/facial droop, or focal weakness. Denies family history of brain cancer or aneurysm.   Pt was discharged from [facility] on Aggrenox but stopped shortly after because of stomach issues. Continues to take daily baby ASA.       PCP: Oneita Jolly, MD      No current facility-administered medications for this encounter.     Current Outpatient Prescriptions   Medication Sig Dispense Refill   . aspirin-dipyridamole (AGGRENOX) 25-200 MG per 12 hr capsule Take 1 capsule by mouth 2 (two) times daily.       Marland Kitchen ALPRAZolam (XANAX) 0.5 MG tablet Take 1 tablet  (0.5 mg total) by mouth 2 (two) times daily as needed.  30 tablet  1   . aspirin 81 MG tablet Take 81 mg by mouth daily.       Marland Kitchen diltiazem (TIAZAC) 240 MG 24 hr capsule Take 1 capsule (240 mg total) by mouth daily.  30 capsule  5   . fluticasone (FLONASE) 50 MCG/ACT nasal spray 2 sprays by Nasal route daily.  16 g  2   . pantoprazole (PROTONIX) 40 MG tablet Take 40 mg by mouth daily.       . solifenacin (VESICARE) 5 MG tablet Take 1 tablet (5 mg total) by mouth daily.  30 tablet  2   . traMADol (ULTRAM) 50 MG tablet Take 1 Tab by Mouth Every 6 Hours As Needed.       . valACYclovir HCL (VALTREX) 500 MG tablet Take 1 tablet (500 mg total) by mouth daily.  90 tablet  1   . Vitamin D, Ergocalciferol, (DRISDOL) 50000 UNIT Cap Take 1 capsule (50,000 Units total) by mouth once a week.  12 capsule  0       Past History     Past Medical History:  Past Medical History   Diagnosis Date   . GERD (gastroesophageal reflux disease)    . Hiatal hernia    . Genital herpes    . TIA (  transient ischemic attack)    . Hypertensive disorder    . Hyperlipidemia        Past Surgical History:  Past Surgical History   Procedure Laterality Date   . Ovary surgery     . Appendectomy     . Fallopian tube surgery     . Spine surgery  11/2013     cervical HNP x 2, Dr. Bufford Buttner       Family History:  Family History   Problem Relation Age of Onset   . Cancer Mother 89     breast cancer       Social History:  History   Substance Use Topics   . Smoking status: Never Smoker    . Smokeless tobacco: Never Used   . Alcohol Use: No       Allergies:  Allergies   Allergen Reactions   . Amoxicillin-Pot Clavulanate      Other reaction(s): gi distress   . Aspirin      Other reaction(s): gi distress  Upsets stomach   . Statins      Other reaction(s): gi distress   . Sulfa Antibiotics      Other reaction(s): gi distress       Review of Systems     Review of Systems   Constitutional: Negative for fever.   HENT: Negative for ear discharge and nosebleeds.    Eyes:  Negative for blurred vision, double vision and photophobia.   Respiratory: Negative for shortness of breath.    Cardiovascular: Negative for chest pain.   Gastrointestinal: Negative for vomiting, abdominal pain and diarrhea.   Genitourinary: Negative for dysuria.   Musculoskeletal: Negative for falls and neck pain.   Skin: Negative for rash.   Neurological: Positive for tingling, sensory change and headaches. Negative for dizziness, speech change, focal weakness, seizures and loss of consciousness.        Numbness   Endo/Heme/Allergies: Does not bruise/bleed easily.   Psychiatric/Behavioral: Negative for suicidal ideas and substance abuse.       Physical Exam   BP 146/86   Pulse 80   Temp(Src) 98.1 F (36.7 C) (Oral)   Resp 16   SpO2 98%     Constitutional: Vital signs reviewed. Well appearing. No distress.  Head: Normocephalic, atraumatic  Eyes: Conjunctiva and sclera are normal.  No injection or discharge.  Ears, Nose, Throat:  Normal external examination of the nose and ears.  Mucous membranes moist.  Neck: Normal range of motion. Supple, no meningeal signs. Trachea midline. No carotid bruit bilaterally.  Respiratory/Chest: Clear to auscultation. No respiratory distress.   Cardiovascular: Regular rate and rhythm. No murmurs.  Abdomen:  Bowel sounds intact. No rebound or guarding. Soft.  Non-tender.  Back: no cva tenderness to percussion.  Upper Extremity:  No edema. No cyanosis. Bilateral radial pulses intact and equal.   Lower Extremity:  No edema. No cyanosis. Bilateral calves symmetrical and non-tender.  Skin: Warm and dry. No rash.  Neuro: CNII -XII intact to testing except for mildly decreased sensation on the L side of face compared to R side of face. Strength 5/5 and symmetrical in the bilateral upper and lower extremities. Sensation to sharp touch intact and equal in the bilateral upper and lower extremities. Coordination intact to finger to nose testing . Normal gait.   Psychiatric: Mildly anxious  affect.  Normal insight.      Diagnostic Study Results     Labs -     Results  Procedure Component Value Units Date/Time    Comprehensive Metabolic Panel (CMP) [[id number]]  (Abnormal) Collected:  03/10/14 1222    Specimen Information:  Blood Updated:  03/10/14 1241     Glucose 120 (H) mg/dL      BUN 7.0 mg/dL      Creatinine 0.8 mg/dL      Sodium 409 mEq/L      Potassium 3.7 mEq/L      Chloride 108 mEq/L      CO2 24 mEq/L      CALCIUM 9.7 mg/dL      Protein, Total 7.1 g/dL      Albumin 3.6 g/dL      AST (SGOT) 12 U/L      ALT 12 U/L      Alkaline Phosphatase 100 U/L      Bilirubin, Total 0.4 mg/dL      Globulin 3.5 g/dL      Albumin/Globulin Ratio 1.0      Anion Gap 11.0     Hemolysis index [[id number]] Collected:  03/10/14 1222     Hemolysis Index 14 Updated:  03/10/14 1241    GFR [[id number]] Collected:  03/10/14 1222     EGFR >60.0 Updated:  03/10/14 1241    CBC and differential [[id number]]  (Abnormal) Collected:  03/10/14 1222    Specimen Information:  Blood / Blood Updated:  03/10/14 1229     WBC 6.22 x10 3/uL      RBC 5.03 x10 6/uL      Hgb 14.3 g/dL      Hematocrit 52.8 %      MCV 85.3 fL      MCH 28.4 pg      MCHC 33.3 g/dL      RDW 15 %      Platelets 251 x10 3/uL      MPV 8.9 (L) fL      Neutrophils 69 %      Lymphocytes Automated 22 %      Monocytes 9 %      Eosinophils Automated 0 %      Basophils Automated 0 %      Immature Granulocyte 0 %      Nucleated RBC 0 /100 WBC      Neutrophils Absolute 4.27 x10 3/uL      Abs Lymph Automated 1.36 x10 3/uL      Abs Mono Automated 0.55 x10 3/uL      Abs Eos Automated 0.03 x10 3/uL      Absolute Baso Automated 0.01 x10 3/uL      Absolute Immature Granulocyte 0.00 x10 3/uL           Radiologic Studies -   Radiology Results (24 Hour)    Procedure Component Value Units Date/Time    CT Head without Contrast [[id number]] Collected:  03/10/14 1329    Order Status:  Completed  Updated:  03/10/14 1334    Narrative:      CLINICAL INDICATION:  Intermittent left-sided  numbness    TECHNIQUE:  Axial noncontrast CT images were obtained from the skull  base to the vertex.    COMPARISON:  12/23/2011    INTERPRETATION: There is no acute intracranial hemorrhage or extra-axial  fluid collection. The ventricles have a normal configuration. There is  no midline shift or mass effect. The gray-white matter differentiation  is normal. 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 The sulci and cisterns are unremarkable.Marland Kitchen    Marland Kitchen  The visualized  paranasal sinuses are clear.      Impression:        No acute intracranial normality. Stable interhemispheric  lipoma.Wyatt Portela, MD   03/10/2014 1:30 PM        .      Medical Decision Making   I am the first provider for this patient.    I reviewed the vital signs, available nursing notes, past medical history, past surgical history, family history and social history.    Vital Signs-Reviewed the patient's vital signs.     Patient Vitals for the past 12 hrs:   BP Temp Pulse Resp   03/10/14 1403 146/86 mmHg 98.1 F (36.7 C) 80 16   03/10/14 1200 168/92 mmHg 98 F (36.7 C) 85 16       Pulse Oximetry Analysis - Normal 100% on RA.     Cardiac Monitor:  Rate: 80  Rhythm:  NSR    EKG:  Interpreted by the EP.   Time Interpreted: 1205   Rate: 83   Rhythm: Normal Sinus Rhythm    Interpretation:No ST elevation.    Comparison: Compared to Jan 1st 2015 EKG - no change.        Old Medical Records: Old medical records.  Previous electrocardiograms.  Nursing notes.     ED Course:     12:15 PM - sent document request to [facility]    1:20 PM - checked with secretary-- still awaiting documents from [facility].    2:36 PM - Discussed patient with Dr. Dr. Lonell Face (Neurology) who recommends discharge home with neurology follow up given that pt had full work up one week ago. She states if patient feels uncomfortable with this plan we can admit to hospital.     2:59 PM - Reviewed Dr. Lyn Henri recommendations with pt. Pt agrees with plan for  outpatient f/u. Repeat neuro exam stable.  Discussed test results with pt and counseled on diagnosis, f/u plans, and signs and symptoms when to return to ED.        Provider Notes: Pt with history of HTN and HL presenting to ED for L sided sensory changes for the past one week. Admitted to [facility] hospital one week ago for same symptoms; unable to get records from [place], but per patient she had MRI/MRA which were negative and she was started on aggrenox and told to f/u with neuro as outpatient. She was told by [facility] doctors that she may have had TIA. Patient's neuro exam in the ED is non-focal except for minimally decreased sharp touch sensation on the L side of face. Discussed with neurology on call for [facility] as above who stated given recent extensive work up, pt safe for discharge home with outpatient neuro follow up as scheduled, though if pt is really not comfortable with that plan we can admit her. Discussed neuro recommendations with pt. Pt was comfortable with discharge and outpatient f/u. Will f/u with PCP and [facility] neurologist as scheduled. Reviewed red flags for return to ED and pt voiced understanding    Core Measures    The eligibility of tPA was assessed and discussed with a neurologist. tPA was not given because the numbness/tingling have been intermittent for the past week.       Diagnosis     Clinical Impression:   1. Numbness and tingling        _______________________________    Attestations:  This note is prepared by Nicholes Rough and  Sbhat Ahmad acting as Neurosurgeon for Lynnea Ferrier, MD.     Lynnea Ferrier, MD - The scribe's documentation has been prepared under my direction and personally reviewed by me in its entirety.  I confirm that the note above accurately reflects all work, treatment, procedures, and medical decision making performed by me.    _______________________________                        Maryella Shivers, MD  03/16/14 [phone]

## 2014-03-10 NOTE — Discharge Instructions (Signed)
Paresthesias    You have been seen for paresthesias.    Paresthesia is an abnormal sensation (feeling) in any part of the body. The paresthesia itself has no long-term bad effects. People often describe it as tingling, numbness, burning, or pricking of the skin. Many say it feels like "pins and needles," or like the body part is asleep.    Paresthesias can be a symptom of some illnesses. This means there are many things that can cause paresthesias. The paresthesias can be a sign of an underlying medical condition.     Some causes of paresthesias are:   Skin Problems: Irritation of skin by certain chemicals. Swelling of the skin from an injury. A burn or frostbite can feel like numbness.   Pressure on a nerve. This can happen when your arm "falls asleep" from lying on it too long. Carpal tunnel syndrome can do the same thing.   Hyperventilation (rapid or deep breathing).   Deficiency in some vitamins and minerals. This includes vitamins B1, B5, and B12.   Electrolyte problems.   Diabetic neuropathy (nerve disorders) from long-standing diabetes.   Problems with circulation.   Strokes.    You may have had some testing to help find out the cause of your paresthesias.    We still do not know the cause of your paresthesias. However, it is OK for you to go home. You may need more tests to figure out the cause.    See your primary care doctor or the referral specialist for more work-up and management of your paresthesias.    The doctor has given you a prescription to have more tests done to monitor your paresthesias. Take the prescription to the lab on the day of your test.     YOU SHOULD SEEK MEDICAL ATTENTION IMMEDIATELY, EITHER HERE OR AT THE NEAREST EMERGENCY DEPARTMENT, IF ANY OF THE FOLLOWING OCCUR:   Your arms get weak, numb or paralyzed (can't move), especially on one side.   You have vision loss, trouble speaking or problems thinking.   Your speech is abnormal or one side of your face  droops.   You lose consciousness ("pass out") or almost lose consciousness.   You have numbness or tingling after a head, neck or back injury.   You feel very dizzy or like the room is spinning.   You have other concerns.

## 2014-03-10 NOTE — ED Notes (Signed)
Bed: PU36  Expected date: 03/10/14  Expected time: 11:51 AM  Means of arrival: [facility]- Generic/No [place] [facility]  Comments:  MEDIC 210

## 2014-03-11 LAB — ECG 12-LEAD
Atrial Rate: 83 {beats}/min
P Axis: 60 degrees
P-R Interval: 152 ms
Q-T Interval: 356 ms
QRS Duration: 84 ms
QTC Calculation (Bezet): 418 ms
R Axis: -15 degrees
T Axis: 20 degrees
Ventricular Rate: 83 {beats}/min

## 2014-03-12 ENCOUNTER — Ambulatory Visit: Payer: No Typology Code available for payment source | Attending: Gastroenterology | Admitting: Gastroenterology

## 2014-03-12 DIAGNOSIS — K862 Cyst of pancreas: Secondary | ICD-10-CM | POA: Insufficient documentation

## 2014-03-12 DIAGNOSIS — K863 Pseudocyst of pancreas: Secondary | ICD-10-CM | POA: Insufficient documentation

## 2014-03-13 ENCOUNTER — Encounter (INDEPENDENT_AMBULATORY_CARE_PROVIDER_SITE_OTHER): Payer: Self-pay | Admitting: Internal Medicine

## 2014-03-14 ENCOUNTER — Encounter (INDEPENDENT_AMBULATORY_CARE_PROVIDER_SITE_OTHER): Payer: Self-pay | Admitting: Cardiovascular Disease

## 2014-03-15 ENCOUNTER — Encounter (INDEPENDENT_AMBULATORY_CARE_PROVIDER_SITE_OTHER): Payer: Self-pay | Admitting: Internal Medicine

## 2014-03-15 ENCOUNTER — Ambulatory Visit (INDEPENDENT_AMBULATORY_CARE_PROVIDER_SITE_OTHER): Payer: Commercial Managed Care - HMO | Admitting: Internal Medicine

## 2014-03-15 VITALS — BP 135/86 | HR 94 | Temp 98.2°F | Resp 14 | Wt 209.0 lb

## 2014-03-15 DIAGNOSIS — I10 Essential (primary) hypertension: Secondary | ICD-10-CM

## 2014-03-15 DIAGNOSIS — F411 Generalized anxiety disorder: Secondary | ICD-10-CM

## 2014-03-15 DIAGNOSIS — K219 Gastro-esophageal reflux disease without esophagitis: Secondary | ICD-10-CM

## 2014-03-15 MED ORDER — ATENOLOL 50 MG PO TABS
50.0000 mg | ORAL_TABLET | Freq: Every day | ORAL | Status: DC
Start: 2014-03-15 — End: 2014-05-09

## 2014-03-15 NOTE — Progress Notes (Signed)
Subjective:       Patient ID: Amber [name] is a 63 y.o. female.    HPI  Pt is here for follow up   1. GERD, had EGD last week, with [name], GI, +esophagitis, gastritis, hiatal hernia, taking protonix x few months,   Still has sx regurgitation, burning pain,   2. HTN, elevated BP still , went to ER twice in last few weeks due to elevated BP's.  rx'd maxzide 1/2 tab, interested in going back on  Bblocker. Had problem with carvedilol but tolerated tenormin.   3. Denies panic attacks. Takes occ xanax.     The following portions of the patient's history were reviewed and updated as appropriate: allergies, current medications, past family history, past medical history, past social history, past surgical history and problem list.    Review of Systems   Constitutional: Negative for fever, chills, appetite change and unexpected weight change.   Eyes: Negative for visual disturbance.   Respiratory: Negative for shortness of breath.    Gastrointestinal: Negative for nausea, vomiting and abdominal pain.   Neurological: Negative for light-headedness and numbness.     BP 135/86   Pulse 94   Temp(Src) 98.2 F (36.8 C) (Oral)   Resp 14   Wt 94.802 kg (209 lb)        Objective:    Physical Exam   Constitutional: No distress.   obese   HENT:   Mouth/Throat: Oropharynx is clear and moist. No oropharyngeal exudate.   Eyes: Conjunctivae are normal. No scleral icterus.   Neck: Neck supple. No JVD present. Carotid bruit is not present. No thyromegaly present.   Cardiovascular: Normal rate, regular rhythm, normal heart sounds and intact distal pulses.  Exam reveals no gallop and no friction rub.    No murmur heard.  Pulmonary/Chest: Effort normal and breath sounds normal. No respiratory distress. She has no rales.   Abdominal: Soft. Bowel sounds are normal. She exhibits no distension. There is no hepatosplenomegaly. There is no tenderness.   Musculoskeletal: She exhibits no edema.   Neurological: She is alert.            Assessment:       1. Unspecified essential hypertension  atenolol (TENORMIN) 50 MG tablet   2. Esophageal reflux     3. Anxiety state, unspecified              Plan:      Procedures    D/c diltiazem  rx atenolol 50mg  daily  Monitor BP  Increase protonix to 40mg  bid until f/u with GI.  Continue other meds  F/u 2 weeks.

## 2014-03-15 NOTE — Progress Notes (Signed)
1. Have you self referred yourself since we last saw you? no  Refer to care team   Or   Add specialists:

## 2014-03-29 ENCOUNTER — Ambulatory Visit (INDEPENDENT_AMBULATORY_CARE_PROVIDER_SITE_OTHER): Payer: Commercial Managed Care - HMO | Admitting: Internal Medicine

## 2014-05-01 ENCOUNTER — Ambulatory Visit (INDEPENDENT_AMBULATORY_CARE_PROVIDER_SITE_OTHER): Payer: Commercial Managed Care - HMO | Admitting: Internal Medicine

## 2014-05-01 ENCOUNTER — Encounter (INDEPENDENT_AMBULATORY_CARE_PROVIDER_SITE_OTHER): Payer: Self-pay | Admitting: Internal Medicine

## 2014-05-01 DIAGNOSIS — G4733 Obstructive sleep apnea (adult) (pediatric): Secondary | ICD-10-CM | POA: Insufficient documentation

## 2014-05-01 DIAGNOSIS — K219 Gastro-esophageal reflux disease without esophagitis: Secondary | ICD-10-CM

## 2014-05-01 DIAGNOSIS — F411 Generalized anxiety disorder: Secondary | ICD-10-CM

## 2014-05-01 DIAGNOSIS — I10 Essential (primary) hypertension: Secondary | ICD-10-CM

## 2014-05-01 MED ORDER — DIAZEPAM 5 MG PO TABS
5.0000 mg | ORAL_TABLET | Freq: Two times a day (BID) | ORAL | Status: DC | PRN
Start: 2014-05-01 — End: 2015-07-19

## 2014-05-01 MED ORDER — DILTIAZEM HCL ER 240 MG PO CP24
240.0000 mg | ORAL_CAPSULE | Freq: Every day | ORAL | Status: DC
Start: 2014-05-01 — End: 2014-06-08

## 2014-05-01 NOTE — Progress Notes (Signed)
Subjective:       Patient ID: Amber [name] is a 63 y.o. female.    HPI  Pt is here for follow up   1. Was in ER at [facility] 2d ago for CP, admitted overnight, r/o'd MI, echo normal, had neg card cath 3/15  2. +GERD, sees [name], GI.  protonix increased to bid. +right upper quadrant pain.   Neg sonogram 1/15. Neg CT abd/pelvis 2013.    3. OSA, recent sleep study, awaiting CPAP titration, sees Dr. Jerold Coombe, pulm.   4. HTN, home BP, 110-120/70-80's,   5. Anxiety, sees a therapist, declined SSRI. Takes occ xanax. Feels Valium was more tolerable.   The following portions of the patient's history were reviewed and updated as appropriate: allergies, current medications, past family history, past medical history, past social history, past surgical history and problem list.    Review of Systems   Constitutional: Negative for fever, chills, appetite change and unexpected weight change.   Respiratory: Negative for shortness of breath.    Cardiovascular: Negative for chest pain and palpitations.   Gastrointestinal: Positive for abdominal pain. Negative for nausea, vomiting, diarrhea and constipation.        No melena, no dysphagia.    Genitourinary: Negative for dysuria.   Neurological: Negative for syncope and light-headedness.   Psychiatric/Behavioral: Negative for dysphoric mood. The patient is nervous/anxious.      BP 138/83 mmHg  Pulse 65  Temp(Src) 98.3 F (36.8 C) (Oral)  Resp 16  Ht 1.549 m (5\' 1" )  Wt 96.616 kg (213 lb)  BMI 40.27 kg/m2       Objective:    Physical Exam   Constitutional: No distress.   obese   HENT:   Mouth/Throat: Oropharynx is clear and moist. No oropharyngeal exudate.   Eyes: Conjunctivae are normal. No scleral icterus.   Neck: Neck supple. No JVD present. Carotid bruit is not present. No thyromegaly present.   Cardiovascular: Normal rate, regular rhythm, normal heart sounds and intact distal pulses.  Exam reveals no gallop and no friction rub.    No murmur  heard.  Pulmonary/Chest: Effort normal and breath sounds normal. No respiratory distress. She has no rales.   Abdominal: Soft. Bowel sounds are normal. She exhibits no distension. There is no hepatosplenomegaly. There is tenderness in the right upper quadrant. There is no rebound and no guarding.   Musculoskeletal: She exhibits no edema.   Neurological: She is alert.           Assessment:       1. Unspecified essential hypertension  diltiazem (DILACOR XR) 240 MG 24 hr capsule   2. Anxiety state, unspecified  diazepam (VALIUM) 5 MG tablet   3. Gastroesophageal reflux disease, esophagitis presence not specified     4. OSA (obstructive sleep apnea)              Plan:      Procedures    Medications and/or tests ordered as noted above.  Continue meds  D/c xanax  rx valium  Monitor BP  F/u with [name], GI.  F/u 3 months or sooner prn.

## 2014-05-02 ENCOUNTER — Ambulatory Visit (INDEPENDENT_AMBULATORY_CARE_PROVIDER_SITE_OTHER): Payer: Commercial Managed Care - HMO | Admitting: Internal Medicine

## 2014-05-09 ENCOUNTER — Other Ambulatory Visit (INDEPENDENT_AMBULATORY_CARE_PROVIDER_SITE_OTHER): Payer: Self-pay | Admitting: Internal Medicine

## 2014-05-09 ENCOUNTER — Encounter (INDEPENDENT_AMBULATORY_CARE_PROVIDER_SITE_OTHER): Payer: Self-pay | Admitting: Internal Medicine

## 2014-05-10 ENCOUNTER — Encounter (INDEPENDENT_AMBULATORY_CARE_PROVIDER_SITE_OTHER): Payer: Self-pay | Admitting: Internal Medicine

## 2014-05-14 ENCOUNTER — Encounter (INDEPENDENT_AMBULATORY_CARE_PROVIDER_SITE_OTHER): Payer: Self-pay | Admitting: Internal Medicine

## 2014-05-21 ENCOUNTER — Ambulatory Visit (INDEPENDENT_AMBULATORY_CARE_PROVIDER_SITE_OTHER): Payer: Commercial Managed Care - HMO | Admitting: Internal Medicine

## 2014-05-31 ENCOUNTER — Encounter (INDEPENDENT_AMBULATORY_CARE_PROVIDER_SITE_OTHER): Payer: Self-pay | Admitting: Internal Medicine

## 2014-06-08 ENCOUNTER — Encounter (INDEPENDENT_AMBULATORY_CARE_PROVIDER_SITE_OTHER): Payer: Self-pay | Admitting: Internal Medicine

## 2014-06-08 ENCOUNTER — Other Ambulatory Visit (FREE_STANDING_LABORATORY_FACILITY): Payer: Commercial Managed Care - HMO

## 2014-06-08 ENCOUNTER — Ambulatory Visit (INDEPENDENT_AMBULATORY_CARE_PROVIDER_SITE_OTHER): Payer: Commercial Managed Care - HMO | Admitting: Internal Medicine

## 2014-06-08 ENCOUNTER — Ambulatory Visit (INDEPENDENT_AMBULATORY_CARE_PROVIDER_SITE_OTHER): Payer: Commercial Managed Care - HMO | Admitting: Family Medicine

## 2014-06-08 ENCOUNTER — Ambulatory Visit (INDEPENDENT_AMBULATORY_CARE_PROVIDER_SITE_OTHER): Payer: Self-pay | Admitting: Internal Medicine

## 2014-06-08 DIAGNOSIS — R300 Dysuria: Secondary | ICD-10-CM

## 2014-06-08 DIAGNOSIS — N3281 Overactive bladder: Secondary | ICD-10-CM

## 2014-06-08 DIAGNOSIS — I10 Essential (primary) hypertension: Secondary | ICD-10-CM

## 2014-06-08 DIAGNOSIS — N318 Other neuromuscular dysfunction of bladder: Secondary | ICD-10-CM

## 2014-06-08 LAB — POCT URINALYSIS DIPSTIX (10)(MULTI-TEST)
Bilirubin, UA POCT: NEGATIVE
Blood, UA POCT: NEGATIVE
Glucose, UA POCT: NEGATIVE mg/dL
Ketones, UA POCT: NEGATIVE mg/dL
Nitrite, UA POCT: NEGATIVE
POCT Leukocytes, UA: NEGATIVE
POCT Spec Gravity, UA: 1.01 (ref 1.001–1.035)
POCT pH, UA: 6 (ref 5–8)
Protein, UA POCT: NEGATIVE mg/dL
Urobilinogen, UA: 0.2 mg/dL

## 2014-06-08 MED ORDER — CIPROFLOXACIN HCL 500 MG PO TABS
500.0000 mg | ORAL_TABLET | Freq: Two times a day (BID) | ORAL | Status: AC
Start: 2014-06-08 — End: 2014-06-13

## 2014-06-08 MED ORDER — DILTIAZEM HCL ER 240 MG PO CP24
240.0000 mg | ORAL_CAPSULE | Freq: Every day | ORAL | Status: DC
Start: 2014-06-08 — End: 2015-03-15

## 2014-06-08 NOTE — Progress Notes (Signed)
Subjective:       Patient ID: Amber [name] is a 63 y.o. female.    HPI  Pt c/o  1. Dysuria, urinary freq x 3-4d, no fever, no gross hematuria, +lower abd pain,   2. +hx OAB, out of Vesicare x 2 months.   3. HTN, stable, needs refills  The following portions of the patient's history were reviewed and updated as appropriate: allergies, current medications, past family history, past medical history, past social history, past surgical history and problem list.    Review of Systems   Constitutional: Negative for fever, chills and appetite change.   Respiratory: Negative for shortness of breath.    Cardiovascular: Negative for chest pain.   Gastrointestinal: Negative for nausea and vomiting.   Genitourinary: Positive for dysuria and frequency. Negative for urgency and hematuria.     BP 143/81 mmHg  Pulse 76  Temp(Src) 98.9 F (37.2 C) (Oral)  Resp 16  Ht 1.549 m (5\' 1" )  Wt 97.705 kg (215 lb 6.4 oz)  BMI 40.72 kg/m2       Objective:    Physical Exam   Constitutional: No distress.   obese   HENT:   Mouth/Throat: Oropharynx is clear and moist. No oropharyngeal exudate.   Eyes: Conjunctivae are normal. No scleral icterus.   Neck: Neck supple.   Cardiovascular: Normal rate, regular rhythm and normal heart sounds.  Exam reveals no gallop and no friction rub.    No murmur heard.  Pulmonary/Chest: Effort normal and breath sounds normal. No respiratory distress. She has no rales.   Abdominal: Soft. Bowel sounds are normal. She exhibits no distension. There is tenderness in the suprapubic area. There is no rebound and no guarding.   Musculoskeletal: She exhibits no edema.   Neurological: She is alert.     POCT urine dipstick neg      Assessment:       1. Dysuria  POCT UA Dipstix (10)(Multi-Test)    Urine culture    ciprofloxacin (CIPRO) 500 MG tablet   2. Unspecified essential hypertension  diltiazem (DILACOR XR) 240 MG 24 hr capsule   3. OAB (overactive bladder)              Plan:      Procedures    Urine cx  sent  Empiric Cipro  rx refills.  Resume Vesicare  Monitor BP  Further recommendations pending test results.  F/u if sx persistent

## 2014-06-11 ENCOUNTER — Encounter (INDEPENDENT_AMBULATORY_CARE_PROVIDER_SITE_OTHER): Payer: Self-pay | Admitting: Internal Medicine

## 2014-06-29 ENCOUNTER — Other Ambulatory Visit (HOSPITAL_BASED_OUTPATIENT_CLINIC_OR_DEPARTMENT_OTHER): Payer: Self-pay

## 2014-06-29 ENCOUNTER — Encounter (INDEPENDENT_AMBULATORY_CARE_PROVIDER_SITE_OTHER): Payer: Self-pay | Admitting: Internal Medicine

## 2014-07-03 ENCOUNTER — Encounter (INDEPENDENT_AMBULATORY_CARE_PROVIDER_SITE_OTHER): Payer: Self-pay | Admitting: Internal Medicine

## 2014-07-27 ENCOUNTER — Ambulatory Visit (INDEPENDENT_AMBULATORY_CARE_PROVIDER_SITE_OTHER): Payer: Commercial Managed Care - HMO | Admitting: Surgery

## 2014-07-27 ENCOUNTER — Encounter (INDEPENDENT_AMBULATORY_CARE_PROVIDER_SITE_OTHER): Payer: Self-pay | Admitting: Surgery

## 2014-07-27 VITALS — Ht 61.0 in | Wt 212.8 lb

## 2014-07-27 DIAGNOSIS — R1011 Right upper quadrant pain: Secondary | ICD-10-CM | POA: Insufficient documentation

## 2014-07-27 DIAGNOSIS — K5901 Slow transit constipation: Secondary | ICD-10-CM | POA: Insufficient documentation

## 2014-07-27 DIAGNOSIS — R1114 Bilious vomiting: Secondary | ICD-10-CM

## 2014-07-27 NOTE — Progress Notes (Signed)
Assessment:    1.  Nausea and vomiting  2.  Epigastric and right upper quadrant tenderness secondary to possible gallbladder disease.  3.  Morbid obesity    Plan:  1.  Patient will be sent for HIDA scan with [facility] since her last HIDA scan was done 3 years ago.  2.  If the HIDA scan is positive for gallbladder dyskinesia/chronic cholecystitis, then the patient will need laparoscopic cholecystectomy which was discussed with the patient.  3.  Patient will follow-up with me after the HIDA scan.        History of present Illness:    The patient is a 63 y.o.  female who presents with the complaint of epigastric and right upper quadrant abdominal pain with nausea and vomiting for the last 3 years.  Patient states that she has had multiple workups including CT scan, upper GIs ultrasounds and HIDA scan.  All the studies have been negative to show a cause for her symptoms.  Patient states that after eating, she has nausea with intermittent bilious emesis.  She denies any diarrhea.  Patient sent his from chronic constipation.  She has constant epigastric and right upper quadrant abdominal pain radiating to her chest and back.  Patient has also had a CT of the chest and abdomen which were negative.  Patient denies any fevers or chills.            The following portions of the patient's history were reviewed and updated as appropriate: allergies, current medications, past family history, past medical history, past social history, past surgical history and problem list.  CURRENT PROBLEM LIST:   Patient Active Problem List   Diagnosis   . Unspecified essential hypertension   . S/P spinal surgery   . Hypertensive disorder   . Hyperlipidemia   . Degenerative joint disease   . OAB (overactive bladder)   . Unspecified vitamin D deficiency   . Anxiety state, unspecified   . OSA (obstructive sleep apnea)   . Abnormal electrocardiogram   . Chest pain   . Cerebral infarction   . Dyslipidemia   . Gastroesophageal reflux disease   .  Hypernatremia   . Hypokalemia   . Pins and needles sensation   . Snoring   . Paresthesia   . Shortness of breath   . Numbness   . Nausea & vomiting   . RUQ abdominal pain   . Slow transit constipation     PAST MEDICAL HISTORY:   Past Medical History   Diagnosis Date   . GERD (gastroesophageal reflux disease)    . Hiatal hernia    . Genital herpes    . TIA (transient ischemic attack)    . Hypertensive disorder    . Hyperlipidemia    . OSA on CPAP      PAST SURGICAL HISTORY:   Past Surgical History   Procedure Laterality Date   . Ovary surgery     . Appendectomy     . Fallopian tube surgery     . Spine surgery  11/2013     cervical HNP x 2, Dr. Bufford Buttner     FAMILY HISTORY:    Family History   Problem Relation Age of Onset   . Cancer Mother 36     breast cancer     SOCIAL HISTORY:   History     Social History   . Marital Status: Single     Spouse Name: N/A     Number of Children: 2   .  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 Smoking status: Never Smoker    . Smokeless tobacco: Never Used   . Alcohol Use: No   . Drug Use: No   . Sexual Activity: None     Other Topics Concern   . None     Social History Narrative    (Does not refresh)  TOBACCO HISTORY:   History   Smoking status   . Never Smoker    Smokeless tobacco   . Never Used     ALCOHOL HISTORY:   History   Alcohol Use No     DRUG HISTORY:   History   Drug Use No     CURRENT HOSPITAL MEDICATIONS:   Current Outpatient Prescriptions   Medication Sig Dispense Refill   . aspirin 81 MG tablet Take 81 mg by mouth daily.     Marland Kitchen atenolol (TENORMIN) 50 MG tablet TAKE 1 TABLET BY MOUTH ONCE DAILY 30 tablet 5   . diazepam (VALIUM) 5 MG tablet Take 1 tablet (5 mg total) by mouth every 12 (twelve) hours as needed for Anxiety. 30 tablet 0   . diltiazem (DILACOR XR) 240 MG 24 hr capsule Take 1 capsule (240 mg total) by mouth daily. 90 capsule 1   . fluticasone (FLONASE) 50 MCG/ACT nasal spray 2 sprays by Nasal  route daily. 16 g 2   . pantoprazole (PROTONIX) 40 MG tablet Take 40 mg by mouth daily.     . solifenacin (VESICARE) 5 MG tablet Take 1 tablet (5 mg total) by mouth daily. 30 tablet 2   . valACYclovir HCL (VALTREX) 500 MG tablet Take 1 tablet (500 mg total) by mouth daily. 90 tablet 1   . aspirin-dipyridamole (AGGRENOX) 25-200 MG per 12 hr capsule Take 1 capsule by mouth 2 (two) times daily.     . Vitamin D, Ergocalciferol, (DRISDOL) 50000 UNIT Cap Take 1 capsule (50,000 Units total) by mouth once a week. 12 capsule 0     No current facility-administered medications for this visit.     CURRENT OUTPATIENT MEDICATIONS:   Outpatient Prescriptions Marked as Taking for the 07/27/14 encounter (Office Visit) with Daryn Hicks R, DO   Medication Sig Dispense Refill   . aspirin 81 MG tablet Take 81 mg by mouth daily.     Marland Kitchen atenolol (TENORMIN) 50 MG tablet TAKE 1 TABLET BY MOUTH ONCE DAILY 30 tablet 5   . diazepam (VALIUM) 5 MG tablet Take 1 tablet (5 mg total) by mouth every 12 (twelve) hours as needed for Anxiety. 30 tablet 0   . diltiazem (DILACOR XR) 240 MG 24 hr capsule Take 1 capsule (240 mg total) by mouth daily. 90 capsule 1   . fluticasone (FLONASE) 50 MCG/ACT nasal spray 2 sprays by Nasal route daily. 16 g 2   . pantoprazole (PROTONIX) 40 MG tablet Take 40 mg by mouth daily.     . solifenacin (VESICARE) 5 MG tablet Take 1 tablet (5 mg total) by mouth daily. 30 tablet 2   . valACYclovir HCL (VALTREX) 500 MG tablet Take 1 tablet (500 mg total) by mouth daily. 90 tablet 1     ALLERGIES:   Allergies   Allergen Reactions   . Amoxicillin-Pot Clavulanate      Other reaction(s): gi distress   . Aspirin      Other reaction(s): gi distress  Upsets stomach   .  Statins      Other reaction(s): gi distress   . Sulfa Antibiotics      Other reaction(s): gi distress        Review of Systems  Constitutional: negative for fevers, night sweats  Respiratory: negative for SOB, cough  Cardiovascular: negative for chest pain,  palpitations  Gastrointestinal: Please refer to the history of present illness  Genitourinary:negative for hematuria, dysuria  Musculoskeletal: Chronic knee pain bilaterally  Neurological: negative for dizziness, gait problems, headaches and memory problems  Behavioral/Psych: negative for fatigue, loss of interest in favorite activities, separation anxiety and sleep disturbance  Endocrine: negative for temperature intolerance    Objective:    Ht 5\' 1"   Wt 212 lb 12.8 oz  BMI 40.23 kg/m2  Body mass index is 40.23 kg/(m^2).  Weight: 212 lb 12.8 oz       General Appearance:    Alert, cooperative, no distress, morbidly obese    Head:    Normocephalic, without obvious abnormality, atraumatic   Eyes:    No scleral icterus            Throat:   Lips, mucosa, and tongue normal; teeth and gums normal   Neck:   Supple, symmetrical, trachea midline, no adenopathy;        thyroid:  No enlargement/tenderness/nodules; no carotid    bruit or JVD   Back:     Symmetric, no curvature, ROM normal, no CVA tenderness   Lungs:     Clear to auscultation bilaterally, respirations unlabored   Chest wall:    No tenderness or deformity   Heart:    Regular rate and rhythm, S1 and S2 normal, no murmur, rub   or gallop   Abdomen:     Soft, epigastric tenderness with guarding, right upper quadrant tenderness with guarding, bowel sounds active, non distended    no masses, no organomegaly   Extremities:   Extremities normal, atraumatic, no cyanosis or edema   Pulses:   2+ and symmetric all extremities   Skin:   Skin color, texture, turgor normal, no rashes or lesions   Lymph nodes:   Cervical, supraclavicular, and axillary nodes normal   Neurologic:   Alert and oriented x 3.  Able to move all extremities.       Data Review:     Office Visit on 06/08/2014   Component Date Value Ref Range Status   . POCT Spec Gravity, UA 06/08/2014 1.010  1.001 - 1.035 Final   . POCT pH, UA 06/08/2014 6.0  5 - 8 Final   . Glucose, UA POCT 06/08/2014 Negative   Negative mg/dL Final   . Protein, UA POCT 06/08/2014 Negative  Negative mg/dL Final   . Ketones, UA POCT 06/08/2014 Negative  Negative mg/dL Final   . Blood, UA POCT 06/08/2014 Negative  Negative, Trace Final   . POCT Leukocytes, UA 06/08/2014 Negative  Negative Final   . Nitrite, UA POCT 06/08/2014 Negative  Negative Final   . Bilirubin, UA POCT 06/08/2014 Negative  Negative Final   . Urobilinogen, UA 06/08/2014 0.2  0.2, 1.0, 2.0 mg/dL Final       No results found.Delsa Grana R. Pourhsojae, DO, FACS

## 2014-08-02 ENCOUNTER — Encounter (INDEPENDENT_AMBULATORY_CARE_PROVIDER_SITE_OTHER): Payer: Self-pay | Admitting: Surgery

## 2014-08-08 ENCOUNTER — Ambulatory Visit (INDEPENDENT_AMBULATORY_CARE_PROVIDER_SITE_OTHER): Payer: Commercial Managed Care - HMO | Admitting: Surgery

## 2014-08-08 ENCOUNTER — Encounter (INDEPENDENT_AMBULATORY_CARE_PROVIDER_SITE_OTHER): Payer: Self-pay | Admitting: Surgery

## 2014-08-08 VITALS — BP 149/86 | HR 67 | Temp 98.0°F | Ht 61.0 in | Wt 213.4 lb

## 2014-08-08 DIAGNOSIS — R10811 Right upper quadrant abdominal tenderness: Secondary | ICD-10-CM | POA: Insufficient documentation

## 2014-08-08 DIAGNOSIS — R1114 Bilious vomiting: Secondary | ICD-10-CM

## 2014-08-08 NOTE — Progress Notes (Signed)
Assessment:    1.  Right upper quadrant pain with nausea and vomiting, most likely secondary to gallbladder disease.    Plan:  1.  Amber gallbladder has been symptomatic enough for Amber [name] to consider a laparoscopic possible open cholecystectomy.  I explained Amber details of Amber operation at length.  Amber risks (including bleeding, infection, bile leakage requiring ERCP and stent, bile duct injury requiring reconstruction, hernia, wound complications, persistent symptoms, bowel injuries, DVT, PE, MI, CVA, etc.) including Amber significance and management.  We will schedule surgery at Amber [name]'s convenience. All questions were answered.  Amber [name] agrees to a laparoscopic possible open cholecystectomy.  Education literature was provided to Amber [name].  I explained to Amber [name] that there is a greater than 40 percent chance her symptoms will not be resolved by cholecystectomy.  2.  [name] will need preoperative labs and EKG.  3.  [name] will need to stop her Aggrenox at least 4 days prior to surgery.          History of present Illness:  Amber [name] is a 63 y.o. female who presents with Amber complaint of epigastric and right upper quadrant abdominal pain with nausea and vomiting for Amber last 3 years. [name] states that she has had multiple workups including CT scan, upper GIs ultrasounds and HIDA scan. All Amber studies have been negative to show a cause for her symptoms. [name] states that after eating, she has nausea with intermittent bilious emesis. She denies any diarrhea. [name] sent his from chronic constipation. She has constant epigastric and right upper quadrant abdominal pain radiating to her chest and back. [name] has also had a CT of Amber chest and abdomen which were negative. [name] denies any fevers or chills.  [name] was sent for a repeat HIDA scan with CCK that showed a ejection fraction of 46 percent.  [name] had 7 out of 10 pain in Amber right upper quadrant that radiated to her back.   [name] also stated that Amber symptoms mimicked Amber symptoms that she has been having.              Amber following portions of Amber [name]'s history were reviewed and updated as appropriate: allergies, current medications, past family history, past medical history, past social history, past surgical history and problem list.  CURRENT PROBLEM LIST:   [name] Active Problem List   Diagnosis   . Unspecified essential hypertension   . S/P spinal surgery   . Hypertensive disorder   . Hyperlipidemia   . Degenerative joint disease   . OAB (overactive bladder)   . Unspecified vitamin D deficiency   . Anxiety state, unspecified   . OSA (obstructive sleep apnea)   . Abnormal electrocardiogram   . Chest pain   . Cerebral infarction   . Dyslipidemia   . Gastroesophageal reflux disease   . Hypernatremia   . Hypokalemia   . Pins and needles sensation   . Snoring   . Paresthesia   . Shortness of breath   . Numbness   . Nausea & vomiting   . RUQ abdominal pain   . Slow transit constipation   . Right upper quadrant abdominal tenderness     PAST MEDICAL HISTORY:   Past Medical History   Diagnosis Date   . GERD (gastroesophageal reflux disease)    . Hiatal hernia    . Genital herpes    . TIA (transient ischemic attack)    . Hypertensive disorder    . Hyperlipidemia    .  OSA on CPAP      PAST SURGICAL HISTORY:   Past Surgical History   Procedure Laterality Date   . Ovary surgery     . Appendectomy     . Fallopian tube surgery     . Spine surgery  11/2013     cervical HNP x 2, Dr. Bufford Buttner     FAMILY HISTORY:    Family History   Problem Relation Age of Onset   . Cancer Mother 53     breast cancer     SOCIAL HISTORY:   History     Social History   . Marital Status: Single     Spouse Name: N/A     Number of Children: 2   . 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 Smoking status: Never Smoker    . Smokeless tobacco: Never Used   . Alcohol Use: No   . Drug Use: No   .  Sexual Activity: None     Other Topics Concern   . None     Social History Narrative    (Does not refresh)  TOBACCO HISTORY:   History   Smoking status   . Never Smoker    Smokeless tobacco   . Never Used     ALCOHOL HISTORY:   History   Alcohol Use No     DRUG HISTORY:   History   Drug Use No     CURRENT HOSPITAL MEDICATIONS:   Current Outpatient Prescriptions   Medication Sig Dispense Refill   . aspirin 81 MG tablet Take 81 mg by mouth daily.     Marland Kitchen aspirin-dipyridamole (AGGRENOX) 25-200 MG per 12 hr capsule Take 1 capsule by mouth 2 (two) times daily.     Marland Kitchen atenolol (TENORMIN) 50 MG tablet TAKE 1 TABLET BY MOUTH ONCE DAILY 30 tablet 5   . diazepam (VALIUM) 5 MG tablet Take 1 tablet (5 mg total) by mouth every 12 (twelve) hours as needed for Anxiety. 30 tablet 0   . diltiazem (DILACOR XR) 240 MG 24 hr capsule Take 1 capsule (240 mg total) by mouth daily. 90 capsule 1   . fluticasone (FLONASE) 50 MCG/ACT nasal spray 2 sprays by Nasal route daily. 16 g 2   . pantoprazole (PROTONIX) 40 MG tablet Take 40 mg by mouth daily.     . solifenacin (VESICARE) 5 MG tablet Take 1 tablet (5 mg total) by mouth daily. 30 tablet 2   . valACYclovir HCL (VALTREX) 500 MG tablet Take 1 tablet (500 mg total) by mouth daily. 90 tablet 1   . Vitamin D, Ergocalciferol, (DRISDOL) 50000 UNIT Cap Take 1 capsule (50,000 Units total) by mouth once a week. 12 capsule 0     No current facility-administered medications for this visit.     CURRENT OUTPATIENT MEDICATIONS:   No outpatient prescriptions have been marked as taking for Amber 08/08/14 encounter (Office Visit) with Brylan Seubert R, DO.     ALLERGIES:   Allergies   Allergen Reactions   . Amoxicillin-Pot Clavulanate      Other reaction(s): gi distress   . Aspirin      Other reaction(s): gi distress  Upsets stomach   . Statins      Other reaction(s): gi distress   . Sulfa Antibiotics      Other reaction(s): gi distress  Review of Systems  Constitutional: negative for fevers, night  sweats  Respiratory: negative for SOB, cough  Cardiovascular: negative for chest pain, palpitations  Gastrointestinal: Positive for nausea and vomiting.  Positive for right upper quadrant pain radiating to right back.  Genitourinary:negative for hematuria, dysuria  Musculoskeletal:negative for bone pain, myalgias and stiff joints  Neurological: negative for dizziness, gait problems, headaches and memory problems  Behavioral/Psych: negative for fatigue, loss of interest in favorite activities, separation anxiety and sleep disturbance  Endocrine: negative for temperature intolerance    Objective:    BP 149/86 mmHg  Pulse 67  Temp(Src) 98 F (36.7 C)  Ht 5\' 1"   Wt 213 lb 6.4 oz  BMI 40.34 kg/m2  Body mass index is 40.34 kg/(m^2).  Weight: 213 lb 6.4 oz       General Appearance:    Alert, cooperative, no distress, well nourished   Head:    Normocephalic, without obvious abnormality, atraumatic   Eyes:    No scleral icterus            Throat:   Lips, mucosa, and tongue normal; teeth and gums normal   Neck:   Supple, symmetrical, trachea midline, no adenopathy;        thyroid:  No enlargement/tenderness/nodules; no carotid    bruit or JVD   Back:     Symmetric, no curvature, ROM normal, no CVA tenderness   Lungs:     Clear to auscultation bilaterally, respirations unlabored   Chest wall:    No tenderness or deformity   Heart:    Regular rate and rhythm, S1 and S2 normal, no murmur, rub   or gallop   Abdomen:     Soft, epigastric and right upper quadrant tenderness with guarding, bowel sounds active, non distended    no masses, no organomegaly   Extremities:   Extremities normal, atraumatic, no cyanosis or edema   Pulses:   2+ and symmetric all extremities   Skin:   Skin color, texture, turgor normal, no rashes or lesions   Lymph nodes:   Cervical, supraclavicular, and axillary nodes normal   Neurologic:   Alert and oriented x 3.  Able to move all extremities.       Data Review:     Office Visit on 06/08/2014    Component Date Value Ref Range Status   . POCT Spec Gravity, UA 06/08/2014 1.010  1.001 - 1.035 Final   . POCT pH, UA 06/08/2014 6.0  5 - 8 Final   . Glucose, UA POCT 06/08/2014 Negative  Negative mg/dL Final   . Protein, UA POCT 06/08/2014 Negative  Negative mg/dL Final   . Ketones, UA POCT 06/08/2014 Negative  Negative mg/dL Final   . Blood, UA POCT 06/08/2014 Negative  Negative, Trace Final   . POCT Leukocytes, UA 06/08/2014 Negative  Negative Final   . Nitrite, UA POCT 06/08/2014 Negative  Negative Final   . Bilirubin, UA POCT 06/08/2014 Negative  Negative Final   . Urobilinogen, UA 06/08/2014 0.2  0.2, 1.0, 2.0 mg/dL Final       No results found.Delsa Grana R. Pourhsojae, DO, FACS

## 2014-08-10 ENCOUNTER — Encounter (INDEPENDENT_AMBULATORY_CARE_PROVIDER_SITE_OTHER): Payer: Self-pay | Admitting: Surgery

## 2014-08-12 DIAGNOSIS — Z79899 Other long term (current) drug therapy: Secondary | ICD-10-CM | POA: Insufficient documentation

## 2014-08-13 DIAGNOSIS — K811 Chronic cholecystitis: Secondary | ICD-10-CM

## 2014-08-13 HISTORY — PX: LAPAROSCOPIC, CHOLECYSTECTOMY, CHOLANGIOGRAM: SHX4475

## 2014-08-16 ENCOUNTER — Encounter (INDEPENDENT_AMBULATORY_CARE_PROVIDER_SITE_OTHER): Payer: Self-pay | Admitting: Surgery

## 2014-08-22 ENCOUNTER — Ambulatory Visit (INDEPENDENT_AMBULATORY_CARE_PROVIDER_SITE_OTHER): Payer: Commercial Managed Care - HMO | Admitting: Internal Medicine

## 2014-08-22 ENCOUNTER — Encounter (INDEPENDENT_AMBULATORY_CARE_PROVIDER_SITE_OTHER): Payer: Self-pay | Admitting: Internal Medicine

## 2014-08-22 VITALS — BP 142/84 | HR 65 | Temp 98.3°F | Resp 16 | Ht 61.0 in | Wt 213.2 lb

## 2014-08-22 DIAGNOSIS — J0101 Acute recurrent maxillary sinusitis: Secondary | ICD-10-CM

## 2014-08-22 DIAGNOSIS — I10 Essential (primary) hypertension: Secondary | ICD-10-CM

## 2014-08-22 MED ORDER — AZITHROMYCIN 250 MG PO TABS
ORAL_TABLET | ORAL | Status: DC
Start: 2014-08-22 — End: 2014-11-07

## 2014-08-22 NOTE — Progress Notes (Signed)
Subjective:       Patient ID: Amber [name] is a 63 y.o. female.    HPI  Pt c/o  Sinus congestion, pain x 2-3 weeks, no fever, +drainage, +hx nasal polyps,   +frequent sinusitis. Using flonase but irregularly. Caused left sinus more painful.   HTN, home BP reportedly normal.   The following portions of the patient's history were reviewed and updated as appropriate: allergies, current medications, past family history, past medical history, past social history, past surgical history and problem list.    Review of Systems   Constitutional: Negative for fever, chills and appetite change.   Eyes: Negative for visual disturbance.   Respiratory: Negative for shortness of breath.    Cardiovascular: Negative for chest pain and palpitations.   Neurological: Negative for headaches.     BP 142/84 mmHg  Pulse 65  Temp(Src) 98.3 F (36.8 C) (Oral)  Resp 16  Ht 1.549 m (5\' 1" )  Wt 96.707 kg (213 lb 3.2 oz)  BMI 40.30 kg/m2       Objective:    Physical Exam   Constitutional: She appears well-developed. No distress.   obese   HENT:   Right Ear: External ear normal.   Left Ear: External ear normal.   Nose: Right sinus exhibits no maxillary sinus tenderness and no frontal sinus tenderness. Left sinus exhibits maxillary sinus tenderness. Left sinus exhibits no frontal sinus tenderness.   Mouth/Throat: Oropharynx is clear and moist. No oropharyngeal exudate.   Eyes: Conjunctivae are normal. No scleral icterus.   Neck: Neck supple. No JVD present. Carotid bruit is not present. No thyromegaly present.   Cardiovascular: Normal rate, regular rhythm, normal heart sounds and intact distal pulses.  Exam reveals no gallop and no friction rub.    No murmur heard.  Pulmonary/Chest: Effort normal and breath sounds normal. No respiratory distress. She has no rales.   Abdominal: Soft. Bowel sounds are normal. She exhibits no distension. There is no tenderness.   Musculoskeletal: She exhibits no edema.   Lymphadenopathy:     She has no  cervical adenopathy.   Neurological: She is alert.           Assessment:       1. Acute recurrent maxillary sinusitis  azithromycin (ZITHROMAX Z-PAK) 250 MG tablet    Ambulatory referral to ENT   2. Essential hypertension              Plan:      Procedures    rx Zpak  Continue flonase, otc Zyrtec  Referred pt to Dr. Aurea Graff, ENT, for evaluation  Continue other meds  Monitor BP, weight reduction  F/u if sx persistent

## 2014-08-27 ENCOUNTER — Encounter (INDEPENDENT_AMBULATORY_CARE_PROVIDER_SITE_OTHER): Payer: Self-pay | Admitting: Surgery

## 2014-08-27 ENCOUNTER — Ambulatory Visit (INDEPENDENT_AMBULATORY_CARE_PROVIDER_SITE_OTHER): Payer: Commercial Managed Care - HMO | Admitting: Surgery

## 2014-08-27 VITALS — BP 140/83 | HR 72 | Temp 98.3°F | Ht 61.0 in | Wt 214.8 lb

## 2014-08-27 DIAGNOSIS — Z9049 Acquired absence of other specified parts of digestive tract: Secondary | ICD-10-CM | POA: Insufficient documentation

## 2014-08-27 NOTE — Progress Notes (Signed)
ASSESSMENT:  Status post laparoscopic cholecystectomy with intraoperative cholangiogram      PLAN:  1. Patient is doing well.   2. Patient's pathology report was reviewed with the patient.   3. Avoid any heavy lifting for one more week.   4. Avoid any fatty or greasy food.   5. Patient is to follow up with me prn    Thank you for letting me participate in this patient's care.        SUBJECTIVE:  The patient is a 63 y.o.  female who presents status post laparoscopic cholecystectomy with intraoperative cholangiogram.  2 weeks ago.  Patient has no complaints.        The following portions of the patient's history were reviewed and updated as appropriate: allergies, current medications, past family history, past medical history, past social history, past surgical history and problem list.    CURRENT PROBLEM LIST:   Patient Active Problem List   Diagnosis   . Unspecified essential hypertension   . S/P spinal surgery   . Hypertensive disorder   . Hyperlipidemia   . Degenerative joint disease   . OAB (overactive bladder)   . Unspecified vitamin D deficiency   . Anxiety state, unspecified   . OSA (obstructive sleep apnea)   . Abnormal electrocardiogram   . Chest pain   . Cerebral infarction   . Dyslipidemia   . Gastroesophageal reflux disease   . Hypernatremia   . Hypokalemia   . Pins and needles sensation   . Snoring   . Paresthesia   . Shortness of breath   . Numbness   . Nausea & vomiting   . Slow transit constipation   . Benign hypertension   . Bilious vomiting   . Colonic constipation   . Polypharmacy   . History of spinal surgery   . Vitamin D deficiency   . Postoperative state   . Overactive bladder   . Osteoarthritis   . Obstructive sleep apnea syndrome   . S/P laparoscopic cholecystectomy     PAST MEDICAL HISTORY:   Past Medical History   Diagnosis Date   . GERD (gastroesophageal reflux disease)    . Hiatal hernia    . Genital herpes    . TIA (transient ischemic attack)    . Hypertensive disorder    .  Hyperlipidemia    . OSA on CPAP      PAST SURGICAL HISTORY:   Past Surgical History   Procedure Laterality Date   . Ovary surgery     . Appendectomy     . Fallopian tube surgery     . Spine surgery  11/2013     cervical HNP x 2, Dr. Bufford Buttner   . Laparoscopic, cholecystectomy, cholangiogram  08/13/2014     TOBACCO HISTORY:   History   Smoking status   . Never Smoker    Smokeless tobacco   . Never Used     ALCOHOL HISTORY:   History   Alcohol Use No     DRUG HISTORY:   History   Drug Use No     OCCUPATION HISTORY: @OCC1 @  CURRENT OUTPATIENT MEDICATIONS:   Outpatient Prescriptions Marked as Taking for the 08/27/14 encounter (Office Visit) with Courtney Bellizzi R, DO   Medication Sig Dispense Refill   . aspirin 81 MG tablet Take 81 mg by mouth daily.     Marland Kitchen atenolol (TENORMIN) 50 MG tablet TAKE 1 TABLET BY MOUTH ONCE DAILY 30 tablet 5   . cholecalciferol (  VITAMIN D3) 400 UNITS Tab Take 400 Units by Mouth Once a Day.     . diazepam (VALIUM) 5 MG tablet Take 1 tablet (5 mg total) by mouth every 12 (twelve) hours as needed for Anxiety. 30 tablet 0   . diltiazem (DILACOR XR) 240 MG 24 hr capsule Take 1 capsule (240 mg total) by mouth daily. 90 capsule 1   . fluticasone (FLONASE) 50 MCG/ACT nasal spray 2 sprays by Nasal route daily. 16 g 2   . hydrochlorothiazide (MICROZIDE) 12.5 MG capsule Take 12.5 mg by Mouth Once a Day.     . pantoprazole (PROTONIX) 40 MG tablet Take 40 mg by mouth daily.     . solifenacin (VESICARE) 5 MG tablet Take 1 tablet (5 mg total) by mouth daily. 30 tablet 2   . valACYclovir HCL (VALTREX) 500 MG tablet Take 1 tablet (500 mg total) by mouth daily. 90 tablet 1   . Vitamin D, Ergocalciferol, (DRISDOL) 50000 UNIT Cap Take 1 capsule (50,000 Units total) by mouth once a week. 12 capsule 0     ALLERGIES:   Allergies   Allergen Reactions   . Amoxicillin-Pot Clavulanate      Other reaction(s): gi distress   . Aspirin      Other reaction(s): gi distress  Upsets stomach   . Statins      Other reaction(s): gi  distress   . Sulfa Antibiotics      Other reaction(s): gi distress       OBJECTIVE:  BP 140/83 mmHg  Pulse 72  Temp(Src) 98.3 F (36.8 C)  Ht 5\' 1"   Wt 214 lb 12.8 oz  BMI 40.61 kg/m2    General appearance: alert, appears stated age and cooperative  Head: Normocephalic, without obvious abnormality, atraumatic  Eyes: negative findings: conjunctivae and sclerae normal  Abdomen: soft, non-tender; bowel sounds normal; no masses,  no organomegaly and Incisions are clean, dry, and intact      No results found.

## 2014-09-13 ENCOUNTER — Encounter (INDEPENDENT_AMBULATORY_CARE_PROVIDER_SITE_OTHER): Payer: Self-pay | Admitting: Internal Medicine

## 2014-10-01 ENCOUNTER — Telehealth (HOSPITAL_BASED_OUTPATIENT_CLINIC_OR_DEPARTMENT_OTHER): Payer: Self-pay | Admitting: Gastroenterology

## 2014-10-01 NOTE — Telephone Encounter (Signed)
Routing to interventional pool

## 2014-10-01 NOTE — Telephone Encounter (Signed)
Have left a message informing the patient.  According to Dr Tye Savoy note, the patient needs a follow up EUS in one year from her last procedure (May 2016).

## 2014-10-01 NOTE — Telephone Encounter (Signed)
CONFIRMED PHONE NUMBER: [phone]  CALLERS FIRST AND LAST NAME: Laureen Abrahams  FACILITY NAME: na TITLE: na  CALLERS RELATIONSHIP:Self  RETURN CALL: OK to leave detailed message with anyone that answers     SUBJECT: General Message   REASON FOR REQUEST: Patient thinks she may be due for an MRI, she thinks that is what Dr Tye Savoy said at the last appointment.    MESSAGE: Please advise patient if she needs an appointment or test.  Thank you.

## 2014-10-10 ENCOUNTER — Encounter (INDEPENDENT_AMBULATORY_CARE_PROVIDER_SITE_OTHER): Payer: Self-pay | Admitting: Internal Medicine

## 2014-10-15 ENCOUNTER — Other Ambulatory Visit (HOSPITAL_BASED_OUTPATIENT_CLINIC_OR_DEPARTMENT_OTHER): Payer: Self-pay

## 2014-10-18 ENCOUNTER — Encounter (INDEPENDENT_AMBULATORY_CARE_PROVIDER_SITE_OTHER): Payer: Self-pay | Admitting: Internal Medicine

## 2014-10-19 ENCOUNTER — Encounter (INDEPENDENT_AMBULATORY_CARE_PROVIDER_SITE_OTHER): Payer: Self-pay | Admitting: Internal Medicine

## 2014-11-01 ENCOUNTER — Encounter (INDEPENDENT_AMBULATORY_CARE_PROVIDER_SITE_OTHER): Payer: Self-pay | Admitting: Internal Medicine

## 2014-11-01 DIAGNOSIS — R079 Chest pain, unspecified: Secondary | ICD-10-CM

## 2014-11-02 DIAGNOSIS — R079 Chest pain, unspecified: Secondary | ICD-10-CM

## 2014-11-02 HISTORY — DX: Chest pain, unspecified: R07.9

## 2014-11-05 ENCOUNTER — Encounter (INDEPENDENT_AMBULATORY_CARE_PROVIDER_SITE_OTHER): Payer: Self-pay | Admitting: Internal Medicine

## 2014-11-07 ENCOUNTER — Ambulatory Visit (INDEPENDENT_AMBULATORY_CARE_PROVIDER_SITE_OTHER): Payer: Commercial Managed Care - HMO | Admitting: Internal Medicine

## 2014-11-07 ENCOUNTER — Encounter (INDEPENDENT_AMBULATORY_CARE_PROVIDER_SITE_OTHER): Payer: Self-pay | Admitting: Internal Medicine

## 2014-11-07 DIAGNOSIS — G459 Transient cerebral ischemic attack, unspecified: Secondary | ICD-10-CM

## 2014-11-07 DIAGNOSIS — E559 Vitamin D deficiency, unspecified: Secondary | ICD-10-CM

## 2014-11-07 DIAGNOSIS — L219 Seborrheic dermatitis, unspecified: Secondary | ICD-10-CM

## 2014-11-07 DIAGNOSIS — E876 Hypokalemia: Secondary | ICD-10-CM

## 2014-11-07 DIAGNOSIS — I10 Essential (primary) hypertension: Secondary | ICD-10-CM

## 2014-11-07 DIAGNOSIS — K219 Gastro-esophageal reflux disease without esophagitis: Secondary | ICD-10-CM

## 2014-11-07 DIAGNOSIS — E785 Hyperlipidemia, unspecified: Secondary | ICD-10-CM

## 2014-11-07 LAB — BASIC METABOLIC PANEL
BUN: 9 mg/dL (ref 7.0–19.0)
CO2: 31 mEq/L — ABNORMAL HIGH (ref 21–30)
Calcium: 9.9 mg/dL (ref 8.5–10.5)
Chloride: 104 mEq/L (ref 100–111)
Creatinine: 0.8 mg/dL (ref 0.4–1.5)
Glucose: 93 mg/dL (ref 70–100)
Potassium: 3.9 mEq/L (ref 3.5–5.3)
Sodium: 144 mEq/L (ref 135–146)

## 2014-11-07 LAB — GFR: EGFR: >60

## 2014-11-07 LAB — HEMOLYSIS INDEX: Hemolysis Index: 4 (ref 0–18)

## 2014-11-07 MED ORDER — PANTOPRAZOLE SODIUM 40 MG PO TBEC
40.0000 mg | DELAYED_RELEASE_TABLET | Freq: Every day | ORAL | Status: DC
Start: 2014-11-07 — End: 2016-04-17

## 2014-11-07 MED ORDER — DESONIDE 0.05 % EX CREA
TOPICAL_CREAM | Freq: Two times a day (BID) | CUTANEOUS | Status: DC | PRN
Start: 2014-11-07 — End: 2017-09-14

## 2014-11-07 NOTE — Progress Notes (Signed)
Subjective:       Patient ID: Amber [name] is a 64 y.o. female.    HPI  Pt c/o  1. Last week +left side face, arm, leg, weakness, numbness, went to [facility] ER,  CT head neg, EKG, CTA neg. Sx resolved in ER. No recurrence since. Hx TIA, unable to take plavix, aggrenox.  2. Had n/v with Dexilant, K+ was low. Med changed to protonix, +occ dysphagia, barium swallow +GERD, hiatal hernia. Sees [name], GI.   3. HTN, home BP stable,   4. Hyperlipidemia, unable to take statins  5. Vitamin D def , not taking supplement  The following portions of the patient's history were reviewed and updated as appropriate: allergies, current medications, past family history, past medical history, past social history, past surgical history and problem list.    Review of Systems   Constitutional: Negative for fever, chills, appetite change and unexpected weight change.   Eyes: Negative for visual disturbance.   Respiratory: Negative for shortness of breath.    Cardiovascular: Negative for chest pain.   Gastrointestinal: Positive for abdominal pain. Negative for nausea, vomiting, diarrhea and constipation.   Genitourinary: Negative for dysuria.   Neurological: Negative for syncope and speech difficulty.     BP 137/82 mmHg  Pulse 62  Temp(Src) 97.7 F (36.5 C) (Oral)  Wt 97.433 kg (214 lb 12.8 oz)  SpO2 98%       Objective:    Physical Exam   Constitutional: She is oriented to person, place, and time. She appears well-developed. No distress.   obese   HENT:   Mouth/Throat: Oropharynx is clear and moist. No oropharyngeal exudate.   No facial droop   Eyes: Conjunctivae and EOM are normal. Pupils are equal, round, and reactive to light. No scleral icterus.   Neck: Neck supple. No JVD present. Carotid bruit is not present. No thyromegaly present.   Cardiovascular: Normal rate, regular rhythm and normal heart sounds.  Exam reveals no gallop and no friction rub.    No murmur heard.  Pulmonary/Chest: Effort normal and breath sounds  normal. No respiratory distress. She has no rales.   Abdominal: Soft. Bowel sounds are normal. She exhibits no distension and no mass. There is tenderness. There is no rebound and no guarding.   +vague tenderness   Musculoskeletal: She exhibits no edema.   Neurological: She is alert and oriented to person, place, and time.   No focal deficits   Skin:   Flaky rash forehead,cheeks           Assessment:       1. Transient cerebral ischemia, unspecified type  US Carotid Duplex Dopp Comp Bilateral   2. Essential hypertension     3. Hyperlipidemia, unspecified hyperlipidemia     4. Gastroesophageal reflux disease, esophagitis presence not specified  pantoprazole (PROTONIX) 40 MG tablet   5. Hypokalemia  Basic Metabolic Panel   6. Vitamin D deficiency  Vitamin D,25 OH, Total   7. Seborrheic dermatitis  desonide (DESOWEN) 0.05 % cream            Plan:      Procedures    Continue meds  Try full strength aspirin 325mg  daily  rx desonide  Carotid duplex ordered  F/u with GI, [name]  Further recommendations pending test results.  F/u 3 months.

## 2014-11-07 NOTE — Progress Notes (Signed)
Have you sought care outside of [facility] since we last saw you?   Yes     [facility]

## 2014-11-08 ENCOUNTER — Encounter (INDEPENDENT_AMBULATORY_CARE_PROVIDER_SITE_OTHER): Payer: Self-pay | Admitting: Internal Medicine

## 2014-11-08 ENCOUNTER — Other Ambulatory Visit (INDEPENDENT_AMBULATORY_CARE_PROVIDER_SITE_OTHER): Payer: Self-pay | Admitting: Internal Medicine

## 2014-11-08 DIAGNOSIS — E559 Vitamin D deficiency, unspecified: Secondary | ICD-10-CM

## 2014-11-08 LAB — VITAMIN D,25 OH,TOTAL: Vitamin D, 25 OH, Total: 18 ng/mL — ABNORMAL LOW (ref 30–100)

## 2014-11-08 MED ORDER — VITAMIN D (ERGOCALCIFEROL) 1.25 MG (50000 UT) PO CAPS
50000.0000 [IU] | ORAL_CAPSULE | ORAL | Status: DC
Start: 2014-11-08 — End: 2015-02-06

## 2014-11-09 ENCOUNTER — Other Ambulatory Visit: Payer: Self-pay | Admitting: Otolaryngology

## 2014-11-09 DIAGNOSIS — J0111 Acute recurrent frontal sinusitis: Secondary | ICD-10-CM

## 2014-11-15 ENCOUNTER — Encounter (INDEPENDENT_AMBULATORY_CARE_PROVIDER_SITE_OTHER): Payer: Self-pay | Admitting: Internal Medicine

## 2014-11-15 ENCOUNTER — Ambulatory Visit
Admission: RE | Admit: 2014-11-15 | Discharge: 2014-11-15 | Disposition: A | Discharge status: Home / Self Care | Payer: Commercial Managed Care - HMO | Source: Ambulatory Visit | Attending: Otolaryngology | Admitting: Otolaryngology

## 2014-11-15 ENCOUNTER — Ambulatory Visit
Admission: RE | Admit: 2014-11-15 | Discharge: 2014-11-15 | Disposition: A | Discharge status: Home / Self Care | Payer: Commercial Managed Care - HMO | Source: Ambulatory Visit | Attending: Internal Medicine | Admitting: Internal Medicine

## 2014-11-15 DIAGNOSIS — J0111 Acute recurrent frontal sinusitis: Secondary | ICD-10-CM | POA: Insufficient documentation

## 2014-11-15 DIAGNOSIS — J3489 Other specified disorders of nose and nasal sinuses: Secondary | ICD-10-CM | POA: Insufficient documentation

## 2014-11-27 ENCOUNTER — Encounter (INDEPENDENT_AMBULATORY_CARE_PROVIDER_SITE_OTHER): Payer: Self-pay | Admitting: Internal Medicine

## 2014-12-25 ENCOUNTER — Other Ambulatory Visit (INDEPENDENT_AMBULATORY_CARE_PROVIDER_SITE_OTHER): Payer: Self-pay | Admitting: Internal Medicine

## 2015-01-01 HISTORY — PX: EGD: SHX3789

## 2015-01-14 ENCOUNTER — Encounter (INDEPENDENT_AMBULATORY_CARE_PROVIDER_SITE_OTHER): Payer: Self-pay | Admitting: Internal Medicine

## 2015-01-16 NOTE — Telephone Encounter (Signed)
LVM for patient informing her that the scheduling template for the month of May is unavailable at this time. She will be contacted as soon as May becomes available.    CCR's - Warm transfer patient to [phone] VM [phone].

## 2015-01-29 ENCOUNTER — Encounter (INDEPENDENT_AMBULATORY_CARE_PROVIDER_SITE_OTHER): Payer: Self-pay | Admitting: Internal Medicine

## 2015-02-04 ENCOUNTER — Telehealth (HOSPITAL_BASED_OUTPATIENT_CLINIC_OR_DEPARTMENT_OTHER): Payer: Self-pay

## 2015-02-04 NOTE — Telephone Encounter (Signed)
Sharepoint Assessment Questions    (no height taken for this visit) (no weight taken for this visit) There is no weight on file to calculate BMI.    Does the patient have sufficient understanding of English? Yes  If NO, which language?    Is the patient able to provide consent? Yes (If no, fill in below)    Legal Guardian                                    DPOA:  NAME:  n/a                                          NAME:  n/a  Phone: n/a                                          Phone:  n/a    PROCEDURE        Procedure MD: Sandford Craze  Procedure Type: EUS  Procedure Date:  03/18/2015     Time:0930    Procedure Check-In Time: 0900    Patient Assessment    Have you ever had any problems in the past with sedation? No  If yes What kind?  n/a  (If yes, send to [facility] for evaluation)    Does the patient use Oxygen at Home?  No  (If yes, send to [facility] for evaluation)    If this is an ERCP appointment ask, does the patient have a contrast allergy?  n/a  If yes, what type of reaction? n/a   (If yes, forward the TE to "p [facility]" for an RN to review)    Is the patient on Anti-coagulant? No  If yes, what Anti-coag medication: n/a  Prescribing provider: n/a    Does the patient have any bleeding or anti-coagulation disorders? No  If yes What kind?  n/a    Is the patient Diabetic?  No  Was patient advised to contact their Prescribing MD for direction? No     Is the patient on Dialysis? No  If yes: is it Peritoneal Dialysis? No    Does the patient use narcotics on a daily basis? No    Does the patient have sleep apnea? No  If yes, does the patient use a sleep apnea machine? No  If yes, was patient instructed to bring their CPAP machine with them? No    Does the patient have a defibrillator? No  If yes, what is the patient's defib device? n/a  Was the device form faxed to the patient's provider? No    Is it difficult to start an IV? No    Does the patient have mobility issues that  make it difficult to get on a stretcher? No    Patient Teaching  Who received the teaching - Patient? Yes    Was the topic of stopping iron supplements taught? n/a    Was the topic of blood thinners taught?  n/a    Was the topic of diet taught? Yes    Were instructions for taking current medications taught? Yes    Was our transportation policy taught? Yes    Were instructions for the ordered laxative  taught? n/a    Which RX was prescribed? n/a    How were the preparation instruction materials delivered?  Verbal: Yes  On Paper: Yes  eCare message: No  Email: No    General Notes:  Patient was informed of our clinic transportation policy and preparation instructions.

## 2015-02-06 ENCOUNTER — Encounter (INDEPENDENT_AMBULATORY_CARE_PROVIDER_SITE_OTHER): Payer: Self-pay | Admitting: Internal Medicine

## 2015-02-06 ENCOUNTER — Ambulatory Visit (INDEPENDENT_AMBULATORY_CARE_PROVIDER_SITE_OTHER): Payer: Commercial Managed Care - HMO | Admitting: Internal Medicine

## 2015-02-06 VITALS — BP 138/87 | HR 65 | Temp 97.5°F | Resp 16 | Ht 61.0 in | Wt 215.8 lb

## 2015-02-06 DIAGNOSIS — Z1239 Encounter for other screening for malignant neoplasm of breast: Secondary | ICD-10-CM

## 2015-02-06 DIAGNOSIS — I10 Essential (primary) hypertension: Secondary | ICD-10-CM

## 2015-02-06 DIAGNOSIS — Z124 Encounter for screening for malignant neoplasm of cervix: Secondary | ICD-10-CM

## 2015-02-06 DIAGNOSIS — R002 Palpitations: Secondary | ICD-10-CM

## 2015-02-06 DIAGNOSIS — M1990 Unspecified osteoarthritis, unspecified site: Secondary | ICD-10-CM

## 2015-02-06 LAB — BASIC METABOLIC PANEL
BUN: 11 mg/dL (ref 7.0–19.0)
CO2: 32 mEq/L — ABNORMAL HIGH (ref 21–30)
Calcium: 9.7 mg/dL (ref 8.5–10.5)
Chloride: 104 mEq/L (ref 100–111)
Creatinine: 0.7 mg/dL (ref 0.4–1.5)
Glucose: 89 mg/dL (ref 70–100)
Potassium: 4.6 mEq/L (ref 3.5–5.3)
Sodium: 141 mEq/L (ref 135–146)

## 2015-02-06 LAB — TSH: TSH: 1.07 u[IU]/mL (ref 0.35–4.94)

## 2015-02-06 LAB — GFR: EGFR: >60

## 2015-02-06 LAB — HEMOLYSIS INDEX: Hemolysis Index: 9 (ref 0–18)

## 2015-02-06 NOTE — Progress Notes (Signed)
1. Have you self referred yourself since we last saw you?    Refer to care team   Or  Add specialists:    NO

## 2015-02-06 NOTE — Progress Notes (Signed)
Subjective:       Patient ID: Amber [name] is a 64 y.o. female.    HPI  Pt is here for follow up   1. Pt received letter from [employer] with concern of CHF, however there is no documented hx CHF. Echo 2/15 +diastolic dysfunction, but preserved EF. +normal card cath 12/2013.   2. C/o palp x 2 weeks, irregular, ?chest tightness, no sob, no anxiety, non exertional, has ov to see Dr. Katrinka Blazing, card next week. Appetite good, no weight loss.  No syncope, no dizziness. Carotid duplex neg.   3. HTN, home BP normal  4. Vitamin D def, taking supplement,   5. Requests referral to ob/gyn   6. DJD, requests [employer] handicap parking   The following portions of the patient's history were reviewed and updated as appropriate: allergies, current medications, past family history, past medical history, past social history, past surgical history and problem list.    Review of Systems   Constitutional: Negative for fever, chills, appetite change and unexpected weight change.   Eyes: Negative for visual disturbance.   Respiratory: Negative for shortness of breath.    Cardiovascular: Positive for palpitations. Negative for chest pain.   Gastrointestinal: Negative for nausea, vomiting and abdominal pain.   Genitourinary: Negative for dysuria.   Musculoskeletal: Positive for arthralgias. Negative for joint swelling.   Neurological: Negative for dizziness, syncope and light-headedness.     BP 138/87 mmHg  Pulse 65  Temp(Src) 97.5 F (36.4 C) (Oral)  Resp 16  Ht 1.549 m (5\' 1" )  Wt 97.886 kg (215 lb 12.8 oz)  BMI 40.80 kg/m2       Objective:    Physical Exam   Constitutional: No distress.   obese   HENT:   Mouth/Throat: Oropharynx is clear and moist. No oropharyngeal exudate.   Eyes: Conjunctivae are normal. No scleral icterus.   Neck: Neck supple. No JVD present. Carotid bruit is not present. No thyromegaly present.   Cardiovascular: Normal rate, regular rhythm, normal heart sounds and intact distal pulses.  Exam reveals no gallop  and no friction rub.    No murmur heard.  Pulmonary/Chest: Effort normal and breath sounds normal. No respiratory distress. She has no rales.   Abdominal: Soft. Bowel sounds are normal. She exhibits no distension. There is no tenderness. There is no rebound and no guarding.   Musculoskeletal: She exhibits tenderness. She exhibits no edema.   Neurological: She is alert.       EKG, sinus rhythm, VR 55, +possible LVH, no acute ischemic changes      Assessment:       1. Palpitations  PR ECG ROUTINE ECG W/LEAST 12 LDS W/I&R    TSH    Basic Metabolic Panel   2. Essential hypertension  Basic Metabolic Panel   3. Breast cancer screening  Mammo Digital Screening Bilateral W Cad   4. Cervical cancer screening  Ambulatory referral to Obstetrics / Gynecology   5. Osteoarthritis, unspecified osteoarthritis type, unspecified site             Plan:      Procedures    Continue meds  See card as scheduled  Mammogram ordered  Referred pt to Dr. Deretha Emory, ob/gyn for gyn care  Monitor BP  DMV form completed  Weight reduction  Further recommendations pending test results.   F/u 3 months.

## 2015-02-08 ENCOUNTER — Encounter (INDEPENDENT_AMBULATORY_CARE_PROVIDER_SITE_OTHER): Payer: Self-pay | Admitting: Internal Medicine

## 2015-02-12 ENCOUNTER — Ambulatory Visit (INDEPENDENT_AMBULATORY_CARE_PROVIDER_SITE_OTHER): Payer: Commercial Managed Care - HMO | Admitting: Cardiology

## 2015-02-12 ENCOUNTER — Encounter (INDEPENDENT_AMBULATORY_CARE_PROVIDER_SITE_OTHER): Payer: Self-pay | Admitting: Cardiology

## 2015-02-12 VITALS — BP 137/80 | HR 64 | Ht 61.0 in | Wt 211.6 lb

## 2015-02-12 DIAGNOSIS — I10 Essential (primary) hypertension: Secondary | ICD-10-CM

## 2015-02-12 DIAGNOSIS — E785 Hyperlipidemia, unspecified: Secondary | ICD-10-CM

## 2015-02-12 DIAGNOSIS — R002 Palpitations: Secondary | ICD-10-CM | POA: Insufficient documentation

## 2015-02-12 MED ORDER — ASPIRIN 325 MG PO TBEC
325.0000 mg | DELAYED_RELEASE_TABLET | Freq: Every day | ORAL | Status: DC
Start: 2015-02-12 — End: 2015-03-15

## 2015-02-12 NOTE — Progress Notes (Signed)
Amber [name] 64 y.o. with a history of HTN presents for cardiac evaluation of palpitations    Cardiac work up has consisted of a cardiac cath ( 01/08/14 - normal ) and echocardiogram ( EF 55%, mild TR)    Palpitations   - no syncope   - fast heart beat    No angina    HTN   - controlled        Past Medical History   Diagnosis Date   . GERD (gastroesophageal reflux disease)    . Hiatal hernia    . Genital herpes    . TIA (transient ischemic attack)    . Hypertensive disorder    . Hyperlipidemia    . OSA on CPAP      Family History   Problem Relation Age of Onset   . Cancer Mother 72     breast cancer     Current Outpatient Prescriptions   Medication Sig Dispense Refill   . albuterol (PROVENTIL HFA;VENTOLIN HFA) 108 (90 BASE) MCG/ACT inhaler Inhale into the lungs.     Marland Kitchen atenolol (TENORMIN) 50 MG tablet TAKE 1 TABLET BY MOUTH EVERY DAY 90 tablet 1   . desonide (DESOWEN) 0.05 % cream Apply topically 2 (two) times daily as needed. 60 g 2   . diazepam (VALIUM) 5 MG tablet Take 1 tablet (5 mg total) by mouth every 12 (twelve) hours as needed for Anxiety. 30 tablet 0   . diltiazem (DILACOR XR) 240 MG 24 hr capsule Take 1 capsule (240 mg total) by mouth daily. 90 capsule 1   . fluticasone (FLONASE) 50 MCG/ACT nasal spray 2 sprays by Nasal route daily. 16 g 2   . hydrochlorothiazide (MICROZIDE) 12.5 MG capsule Take 12.5 mg by Mouth Once a Day.     . pantoprazole (PROTONIX) 40 MG tablet Take 1 tablet (40 mg total) by mouth daily. 30 tablet 2   . valACYclovir HCL (VALTREX) 500 MG tablet Take 1 tablet (500 mg total) by mouth daily. 90 tablet 1   . aspirin EC 325 MG EC tablet Take 1 tablet (325 mg total) by mouth daily. 30 tablet 0     No current facility-administered medications for this visit.              PE:    Filed Vitals:    02/12/15 1308   BP: 137/80   Pulse: 64     Body mass index is 40 kg/(m^2).    General:  Patient appears their stated age, well-nourished.  Alert and in no apparent distress.  Eyes: No conjunctivitis,  no purulent discharge  ENT: Lips moist, color appropriate for race.  Respiratory: Clear to auscultation and percussion throughout. Respiratory effort unlabored, chest expansion symmetric.    Cardio: Regular rate and rhythm.  No carotid bruits or thrills, no JVD.  Extremities: warm, pulses 2+, no peripheral edema  GI: Soft, nondistended, nontender.  No guarding or rebound.  Skin: Color appropriate for race, Skin warm, dry, and intact  Psychiatric: Normal mood and affect    EKG:    Nsr, nstwa    Labs:  Lipid Panel   CHOLESTEROL   Date/Time Value Ref Range Status   05/21/2008 07:34 AM 240* 50 - 200 mg/dL Final     TRIGLYCERIDES   Date/Time Value Ref Range Status   05/21/2008 07:34 AM 76 35 - 135 mg/dL Final     HDL   Date/Time Value Ref Range Status   05/21/2008 07:34 AM 54 35 -  86 mg/dL Final     Comment:     Relative Risk of Coronary Heart Disease as a function of HDL Cholesterol                      (Average Risk = 1.00)    ---------------------------------------------------           HDL Cholesterol   /       Relative Risk    ---------------------------------------------------         mg/dl                   Women       Men         30                      ----        1.82         35                      ----        1.49         40                      1.94        1.22         45                      1.55        1.00         50                      1.25        0.82         55                      1.00        0.67         60                      0.80        0.55         65                      0.64        0.45         70                      0.52        ----    ---------------------------------------------------       CMP:   SODIUM   Date/Time Value Ref Range Status   02/06/2015 02:50 PM 141 135 - 146 mEq/L Final     POTASSIUM   Date/Time Value Ref Range Status   02/06/2015 02:50 PM 4.6 3.5 - 5.3 mEq/L Final     CHLORIDE   Date/Time Value Ref Range Status   02/06/2015 02:50 PM 104 100 - 111 mEq/L Final     CO2    Date/Time Value Ref Range Status   02/06/2015 02:50 PM 32* 21 - 30 mEq/L Final     GLUCOSE   Date/Time Value Ref Range Status   02/06/2015 02:50 PM 89 70 - 100 mg/dL Final     Comment:  Interpretive Data for Adult Female and Female Population  Indeterminate Range:  100-125 mg/dL  Equal to or greater than 126 mg/dL meets the ADA  guidelines for Diabetes Mellitus diagnosis if symptoms  are present and confirmed by repeat testing.  Random (Non-Fasting)Interpretive Data (Adults):  Equal to or greater than 200 mg/dL meets the ADA  guidelines for Diabetes Mellitus diagnosis if symptoms  are present and confirmed by Fasting Glucose or GTT.       BUN   Date/Time Value Ref Range Status   02/06/2015 02:50 PM 11.0 7.0 - 19.0 mg/dL Final     PROTEIN, TOTAL   Date/Time Value Ref Range Status   03/10/2014 12:22 PM 7.1 6.0 - 8.3 g/dL Final     ALKALINE PHOSPHATASE   Date/Time Value Ref Range Status   03/10/2014 12:22 PM 100 37 - 106 U/L Final     AST (SGOT)   Date/Time Value Ref Range Status   03/10/2014 12:22 PM 12 5 - 34 U/L Final     ALT   Date/Time Value Ref Range Status   03/10/2014 12:22 PM 12 0 - 55 U/L Final     ANION GAP   Date/Time Value Ref Range Status   03/10/2014 12:22 PM 11.0 5.0 - 15.0 Final       CBC:   WBC   Date/Time Value Ref Range Status   03/10/2014 12:22 PM 6.22 3.50 - 10.80 x10 3/uL Final   09/01/2012 03:03 AM 5.73 3.50 - 10.80 x10 3/uL Final   06/30/2009 04:03 PM 9.12 3.50 - 10.80 /CUMM Final     RBC   Date/Time Value Ref Range Status   03/10/2014 12:22 PM 5.03 4.20 - 5.40 x10 6/uL Final     HGB   Date/Time Value Ref Range Status   03/10/2014 12:22 PM 14.3 12.0 - 16.0 g/dL Final     HEMATOCRIT   Date/Time Value Ref Range Status   03/10/2014 12:22 PM 42.9 37.0 - 47.0 % Final     MCV   Date/Time Value Ref Range Status   03/10/2014 12:22 PM 85.3 80.0 - 100.0 fL Final     MCHC   Date/Time Value Ref Range Status   03/10/2014 12:22 PM 33.3 32.0 - 36.0 g/dL Final     RDW   Date/Time Value Ref Range Status    03/10/2014 12:22 PM 15 12 - 15 % Final     PLATELETS   Date/Time Value Ref Range Status   03/10/2014 12:22 PM 251 140 - 400 x10 3/uL Final           Impression / plan    Palpitations   - event monitor   - normal LV function  HTN   - controlled   -The current medical regimen is effective;  continue present plan and medications.        Kenda Kloehn Dan Humphreys  02/12/2015

## 2015-02-13 ENCOUNTER — Telehealth (INDEPENDENT_AMBULATORY_CARE_PROVIDER_SITE_OTHER): Payer: Self-pay

## 2015-02-13 NOTE — Telephone Encounter (Signed)
Enrolled pt Cardiac Event Monitor per Dr. Katrinka Blazing & faxed enrollment w/ pt demographics & order to [phone]

## 2015-02-14 ENCOUNTER — Encounter (INDEPENDENT_AMBULATORY_CARE_PROVIDER_SITE_OTHER): Payer: Self-pay | Admitting: Cardiology

## 2015-02-20 ENCOUNTER — Encounter (INDEPENDENT_AMBULATORY_CARE_PROVIDER_SITE_OTHER): Payer: Self-pay | Admitting: Cardiology

## 2015-02-26 ENCOUNTER — Other Ambulatory Visit (INDEPENDENT_AMBULATORY_CARE_PROVIDER_SITE_OTHER): Payer: Self-pay | Admitting: Cardiology

## 2015-02-27 LAB — HEPATIC FUNCTION PANEL
ALT: 9 U/L (ref 6–29)
AST (SGOT): 12 U/L (ref 10–35)
Albumin/Globulin Ratio: 1.5 (ref 1.0–2.5)
Albumin: 3.8 G/DL (ref 3.6–5.1)
Alkaline Phosphatase: 102 U/L (ref 33–130)
Bilirubin Direct: 0.1 MG/DL (ref 0.0–0.2)
Bilirubin Indirect: 0.3 MG/DL (ref 0.2–1.2)
Bilirubin, Total: 0.4 MG/DL (ref 0.2–1.2)
Globulin: 2.6 G/DL (ref 1.9–3.7)
Protein, Total: 6.4 G/DL (ref 6.1–8.1)

## 2015-02-27 LAB — LIPID PANEL
Cholesterol / HDL Ratio: 3.5 (calc) (ref 0.0–5.0)
Cholesterol: 235 mg/dL — ABNORMAL HIGH (ref 125–200)
HDL: 68 mg/dL (ref >=46)
LDL Calculated: 153 mg/dL — ABNORMAL HIGH (ref <130)
Non HDL Cholesterol (LDL and VLDL): 167 mg/dL — AB
Triglycerides: 70 mg/dL (ref <150)

## 2015-02-28 ENCOUNTER — Other Ambulatory Visit (INDEPENDENT_AMBULATORY_CARE_PROVIDER_SITE_OTHER): Payer: Self-pay | Admitting: Internal Medicine

## 2015-02-28 DIAGNOSIS — A6000 Herpesviral infection of urogenital system, unspecified: Secondary | ICD-10-CM

## 2015-03-01 ENCOUNTER — Other Ambulatory Visit (INDEPENDENT_AMBULATORY_CARE_PROVIDER_SITE_OTHER): Payer: Self-pay | Admitting: Internal Medicine

## 2015-03-01 DIAGNOSIS — A6000 Herpesviral infection of urogenital system, unspecified: Secondary | ICD-10-CM

## 2015-03-01 MED ORDER — VALACYCLOVIR HCL 500 MG PO TABS
500.0000 mg | ORAL_TABLET | Freq: Every day | ORAL | Status: DC
Start: 2015-03-01 — End: 2015-09-27

## 2015-03-01 NOTE — Addendum Note (Signed)
Addended by: Ricke Hey on: 03/01/2015 09:52 AM     Modules accepted: Orders

## 2015-03-01 NOTE — Telephone Encounter (Signed)
Pt called need refill valACYclovir HCL  500

## 2015-03-01 NOTE — Telephone Encounter (Addendum)
Last OV 02/06/15

## 2015-03-01 NOTE — Telephone Encounter (Deleted)
Last OV 02/06/15

## 2015-03-04 ENCOUNTER — Telehealth (INDEPENDENT_AMBULATORY_CARE_PROVIDER_SITE_OTHER): Payer: Self-pay

## 2015-03-04 ENCOUNTER — Encounter (INDEPENDENT_AMBULATORY_CARE_PROVIDER_SITE_OTHER): Payer: Commercial Managed Care - HMO | Admitting: Surgery

## 2015-03-04 NOTE — Progress Notes (Signed)
This encounter was created in error - please disregard.

## 2015-03-04 NOTE — Telephone Encounter (Signed)
Patient came in for bariatric consult appointment.  Upon arrival, patient c/o dizziness and nausea.  B/P upon arrival 140/78.  Orthostatic B/P as follows: lying - 136-92, sitting 146/87, standing 127/90.  HR 90, Temp 97.7.  Patient denies pain, chest pain, and shortness of breath.  Patient advised to seek medical attention at ER due to symptoms and extensive medical history, and history of TIAs.  Patient unable to drive in current condition, no relatives available to transport patient.  Patient agreed to go to hospital for evaluation via ambulance.  911 contacted to assist patient.

## 2015-03-05 DIAGNOSIS — E785 Hyperlipidemia, unspecified: Secondary | ICD-10-CM

## 2015-03-05 DIAGNOSIS — R002 Palpitations: Secondary | ICD-10-CM

## 2015-03-05 DIAGNOSIS — Z6841 Body Mass Index (BMI) 40.0 and over, adult: Secondary | ICD-10-CM

## 2015-03-05 DIAGNOSIS — R0781 Pleurodynia: Secondary | ICD-10-CM

## 2015-03-05 DIAGNOSIS — I10 Essential (primary) hypertension: Secondary | ICD-10-CM

## 2015-03-06 ENCOUNTER — Encounter (INDEPENDENT_AMBULATORY_CARE_PROVIDER_SITE_OTHER): Payer: Self-pay | Admitting: Internal Medicine

## 2015-03-14 ENCOUNTER — Telehealth (INDEPENDENT_AMBULATORY_CARE_PROVIDER_SITE_OTHER): Payer: Self-pay | Admitting: Cardiology

## 2015-03-14 ENCOUNTER — Encounter (INDEPENDENT_AMBULATORY_CARE_PROVIDER_SITE_OTHER): Payer: Self-pay | Admitting: Cardiology

## 2015-03-14 NOTE — Telephone Encounter (Signed)
Patient returned event monitor to [place] office on 03/14/15. Report scanned to patient chart. Imejia 03/14/15

## 2015-03-15 ENCOUNTER — Ambulatory Visit (INDEPENDENT_AMBULATORY_CARE_PROVIDER_SITE_OTHER): Payer: Commercial Managed Care - HMO | Admitting: Internal Medicine

## 2015-03-15 ENCOUNTER — Encounter (INDEPENDENT_AMBULATORY_CARE_PROVIDER_SITE_OTHER): Payer: Self-pay | Admitting: Internal Medicine

## 2015-03-15 DIAGNOSIS — R0789 Other chest pain: Secondary | ICD-10-CM

## 2015-03-15 DIAGNOSIS — K219 Gastro-esophageal reflux disease without esophagitis: Secondary | ICD-10-CM

## 2015-03-15 DIAGNOSIS — I10 Essential (primary) hypertension: Secondary | ICD-10-CM

## 2015-03-15 MED ORDER — DILTIAZEM HCL ER 240 MG PO CP24
240.0000 mg | ORAL_CAPSULE | Freq: Every day | ORAL | Status: DC
Start: 2015-03-15 — End: 2015-09-18

## 2015-03-15 NOTE — Progress Notes (Signed)
1. Have you self referred yourself since we last saw you? No  Refer to care team   Or   Add specialists:

## 2015-03-15 NOTE — Progress Notes (Signed)
Subjective:       Patient ID: Amber [name] is a 64 y.o. female.    HPI  Pt c/o  1. Adm at [facility] 03/04/15 for cp, fluctuating BP. CXR, EKG, labs neg.  Last stress test 8/15 normal, had holter for 30days, results pending,  Has ov to see card Dr. 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 HTN, home BP 120-130's/80's,   3.+intermittent nausea, no vomiting, no dysphagia. sees [name], has zofran, EGD 3/16 +esophagitis, gastric polyp, hiatal hernia    The following portions of the patient's history were reviewed and updated as appropriate: allergies, current medications, past family history, past medical history, past social history, past surgical history and problem list.    Review of Systems   Constitutional: Negative for fever, chills, appetite change and unexpected weight change.   Respiratory: Negative for shortness of breath.    Cardiovascular: Positive for chest pain. Negative for palpitations.   Gastrointestinal: Positive for nausea. Negative for vomiting, abdominal pain, diarrhea and constipation.   Neurological: Negative for syncope and light-headedness.     BP 144/86 mmHg  Pulse 70  Temp(Src) 97.7 F (36.5 C) (Oral)  Ht 1.549 m (5\' 1" )  Wt 96.707 kg (213 lb 3.2 oz)  BMI 40.30 kg/m2       Objective:    Physical Exam   Constitutional: She appears well-developed. No distress.   obese   HENT:   Mouth/Throat: Oropharynx is clear and moist. No oropharyngeal exudate.   Eyes: Conjunctivae are normal. No scleral icterus.   Neck: Neck supple. No JVD present. Carotid bruit is not present. No thyromegaly present.   Cardiovascular: Normal rate, regular rhythm and normal heart sounds.  Exam reveals no gallop and no friction rub.    No murmur heard.  Pulmonary/Chest: Effort normal and breath sounds normal. No respiratory distress. She has no rales.   Abdominal: Soft. Bowel sounds are normal. She exhibits no distension. There is no tenderness. There is no rebound and  no guarding.   Musculoskeletal: She exhibits no edema.   Neurological: She is alert.           Assessment:       1. Atypical chest pain     2. Essential hypertension  diltiazem (DILACOR XR) 240 MG 24 hr capsule   3. Gastroesophageal reflux disease, esophagitis presence not specified             Plan:      Procedures    rx refills. Continue meds  Increase protonix to 40mg  bid  F/u with Gi, [name]  Monitor home BP  F/u card as scheduled  Weight reduction  F/u 3 months or sooner prn

## 2015-03-16 ENCOUNTER — Encounter (INDEPENDENT_AMBULATORY_CARE_PROVIDER_SITE_OTHER): Payer: Self-pay | Admitting: Internal Medicine

## 2015-03-18 ENCOUNTER — Ambulatory Visit: Payer: No Typology Code available for payment source | Attending: Gastroenterology | Admitting: Gastroenterology

## 2015-03-18 ENCOUNTER — Ambulatory Visit (INDEPENDENT_AMBULATORY_CARE_PROVIDER_SITE_OTHER): Payer: Commercial Managed Care - HMO | Admitting: Surgery

## 2015-03-18 ENCOUNTER — Encounter (INDEPENDENT_AMBULATORY_CARE_PROVIDER_SITE_OTHER): Payer: Self-pay | Admitting: Surgery

## 2015-03-18 VITALS — BP 137/82 | HR 66 | Temp 97.7°F | Ht 61.0 in | Wt 213.0 lb

## 2015-03-18 DIAGNOSIS — K862 Cyst of pancreas: Secondary | ICD-10-CM | POA: Insufficient documentation

## 2015-03-18 DIAGNOSIS — Z6841 Body Mass Index (BMI) 40.0 and over, adult: Secondary | ICD-10-CM | POA: Insufficient documentation

## 2015-03-18 DIAGNOSIS — R0781 Pleurodynia: Secondary | ICD-10-CM

## 2015-03-18 MED ORDER — LIDOCAINE 5 % EX PTCH
1.0000 | MEDICATED_PATCH | Freq: Two times a day (BID) | CUTANEOUS | Status: DC | PRN
Start: 2015-03-18 — End: 2015-09-25

## 2015-03-18 NOTE — Progress Notes (Signed)
Assessment:  Morbid obesity  BMI of 40  Hypertension.  Obstructive sleep apnea on CPAP.  Gastroesophageal reflux disease with esophagitis.  Hiatal hernia.  Anxiety.  Vitamin D deficiency.  Osteoarthritis bilateral knees  History of transient ischemic attacks  Right rib pain      Plan:  In brief , Mrs. Hovland is a 64 y.o. year old morbidly obese patient with comorbid conditions who has expressed an interest in laparoscopic RNY-Gastric Bypass. Patient was given a preoperative testing check list which includes dietary and psychiatric counseling for behavioral modification and medical/cardiac clearance (if indicated). Patient will be seen in the office on multiple occasions preoperatively.   In the best interest of this patients overall success, we would appreciate your assistance with changing medications to chewable, crushable and/or liquid form for postoperative period and necessary preoperative clearances.      History of Present Illness:  I had a pleasure of seeing Ms. Hetty Ely in the office in consultation for possible bariatric surgery to resolve and treat morbid obesity and comorbid conditions. Patient has tried and failed multiple previous attempts at conservative weight loss programs. Bariatric comorbidities present: hypertension, GERD, osteoarthritis and obstructive sleep apnea on CPAP  We had a long discussion and thoroughly reviewed past medical and surgical history. she has attended an informational seminar/webinar and was given a booklet summarizing weight loss surgery options, risks and results. The surgical options discussed include laparoscopic Roux-en Y gastric bypass, laparoscopic adjustable gastric banding and laparoscopic sleeve gastrectomy. All questions and concerns were addressed in detail.      The following portions of the patient's history were reviewed and updated as appropriate: allergies, current medications, past family history, past medical history, past social  history, past surgical history and problem list.  CURRENT PROBLEM LIST:   Patient Active Problem List   Diagnosis   . Essential hypertension   . S/P spinal surgery   . Hyperlipidemia   . OAB (overactive bladder)   . Anxiety state, unspecified   . OSA (obstructive sleep apnea)   . Abnormal electrocardiogram   . Cerebral infarction   . Dyslipidemia   . Gastroesophageal reflux disease   . Hypernatremia   . Hypokalemia   . Pins and needles sensation   . Snoring   . Paresthesia   . Shortness of breath   . Numbness   . Slow transit constipation   . Polypharmacy   . Vitamin D deficiency   . Overactive bladder   . Osteoarthritis   . S/P laparoscopic cholecystectomy   . Transient cerebral ischemia, unspecified type   . Palpitations   . Temporary cerebral vascular dysfunction   . Pituitary microadenoma   . Morbid obesity with BMI of 40.0-44.9, adult     PAST MEDICAL HISTORY:   Past Medical History   Diagnosis Date   . GERD (gastroesophageal reflux disease)    . Hiatal hernia    . Genital herpes    . TIA (transient ischemic attack)    . Hypertensive disorder    . Hyperlipidemia    . OSA on CPAP    . Osteoarthritis of knees, bilateral      PAST SURGICAL HISTORY:   Past Surgical History   Procedure Laterality Date   . Ovary surgery Right    . Appendectomy     . Fallopian tube surgery     . Spine surgery  11/2013     cervical HNP x 2, Dr. Bufford Buttner   . Laparoscopic, cholecystectomy, cholangiogram  08/13/2014     FAMILY HISTORY:    Family History   Problem Relation Age of Onset   . Cancer Mother 72     breast cancer     SOCIAL HISTORY:   History     Social History   . Marital Status: Single     Spouse Name: N/A   . Number of Children: 2   . 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 Smoking status: Never Smoker    . Smokeless tobacco: Never Used   . Alcohol Use: No   . Drug Use: No   . Sexual Activity: Not on file     Other Topics Concern   . Dietary Supplements /  Vitamins Yes   . Anesthesia Problems No   . Blood Thinners No   . Eats Large Amounts No   . Excessive Sweets No   . Skips Meals No   . Eats Excessive Starches Yes   . Snacks Or Grazes No   . Emotional Eater Yes   . Eats Fried Food Yes   . Eats Fast Food Yes   . Diet Center No   . Hmr No   . [name] No   . La Weight Loss No   . Nutri-System No   . Opti-Fast / Medi-Fast No   . Overeaters Anonymous No   . [facility] Weight Loss Center No   . Tops No   . Weight Watchers No   . Atkins No   . Binging / Purging No   . Body For Life No   . Cabbage Soup No   . Calorie Counting No   . Fasting No   . [name] No   . Health Spa No   . Herbal Life No   . High Protein No   . Low Carb No   . Low Fat No   . [facility] Diet No   . [place] Diet No   . Margie Billet Diet No   . [place] Diet No   . Slim Fast No   . [place] No   . Sugar Busters No   . Vomiting No   . Zone Diet No   . Stationary Cycle Or Treadmill No   . Gym/Fitness Classes No   . Home Exercise/Video No   . Swimming No   . Team Sports No   . Weight Training No   . Walking Or Running No   . Hospitalization No   . Hypnosis No   . Physical Therapy No   . Psychological Therapy No   . Residential Program No   . Acutrim No   . Amphetamines No   . Anorex No   . Byetta No   . Dexatrim No   . Didrex No   . Fastin No   . Fen - Phen No   . Ionamin / Adipex No   . Mazanor No   . Meridia No   . Obalan No   . Phendiet No   . Phentrol No   . Phenteramine Yes   . Plegine No   . Pondimin No   . Qsymia No   . Prozac No   . Redux No   . Sanorex No   . Tenuate No   . Tepanole No   . Wechless No   . Wellbutrin No   .  Xenical (Orlistat, Alli) No   . Other Med No   . No Impairment Yes     Chronic Bilat Knee Pain   . Walks With Cane/Crutch Yes   . Requires A Wheelchair No   . Bedridden No   . Are You Currently Being Treated For Depression? No   . Do You Snore? Yes   . Are You Receiving Any Medical Or Psychological Services? No   . Do You Have Or Have You Been Treated For An  Eating Disorder? No   . Do You Exercise Regularly? No   . Have You Or Family Member Ever Have Trouble With Anesthesia? No     Social History Narrative    (Does not refresh)  TOBACCO HISTORY:   History   Smoking status   . Never Smoker    Smokeless tobacco   . Never Used     ALCOHOL HISTORY:   History   Alcohol Use No     DRUG HISTORY:   History   Drug Use No     CURRENT HOSPITAL MEDICATIONS:   Current Outpatient Prescriptions   Medication Sig Dispense Refill   . albuterol (PROVENTIL HFA;VENTOLIN HFA) 108 (90 BASE) MCG/ACT inhaler Inhale into the lungs.     Marland Kitchen aspirin EC 81 MG EC tablet Take 81 mg by mouth daily.     Marland Kitchen atenolol (TENORMIN) 50 MG tablet TAKE 1 TABLET BY MOUTH EVERY DAY 90 tablet 1   . cholecalciferol (VITAMIN D3) 1000 UNITS tablet Take 1,000 Units by Mouth Once a Day.     . desonide (DESOWEN) 0.05 % cream Apply topically 2 (two) times daily as needed. 60 g 2   . diazepam (VALIUM) 5 MG tablet Take 1 tablet (5 mg total) by mouth every 12 (twelve) hours as needed for Anxiety. 30 tablet 0   . diltiazem (DILACOR XR) 240 MG 24 hr capsule Take 1 capsule (240 mg total) by mouth daily. 90 capsule 1   . fluticasone (FLONASE) 50 MCG/ACT nasal spray 2 sprays by Nasal route daily. 16 g 2   . hydrochlorothiazide (MICROZIDE) 12.5 MG capsule Take 12.5 mg by Mouth Once a Day.     . pantoprazole (PROTONIX) 40 MG tablet Take 1 tablet (40 mg total) by mouth daily. 30 tablet 2   . valACYclovir HCL (VALTREX) 500 MG tablet Take 1 tablet (500 mg total) by mouth daily. 90 tablet 1     No current facility-administered medications for this visit.     CURRENT OUTPATIENT MEDICATIONS:   Outpatient Prescriptions Marked as Taking for the 03/18/15 encounter (Office Visit) with Joziah Dollins R, DO   Medication Sig Dispense Refill   . albuterol (PROVENTIL HFA;VENTOLIN HFA) 108 (90 BASE) MCG/ACT inhaler Inhale into the lungs.     Marland Kitchen aspirin EC 81 MG EC tablet Take 81 mg by mouth daily.     Marland Kitchen atenolol (TENORMIN) 50 MG tablet TAKE 1  TABLET BY MOUTH EVERY DAY 90 tablet 1   . cholecalciferol (VITAMIN D3) 1000 UNITS tablet Take 1,000 Units by Mouth Once a Day.     . desonide (DESOWEN) 0.05 % cream Apply topically 2 (two) times daily as needed. 60 g 2   . diazepam (VALIUM) 5 MG tablet Take 1 tablet (5 mg total) by mouth every 12 (twelve) hours as needed for Anxiety. 30 tablet 0   . diltiazem (DILACOR XR) 240 MG 24 hr capsule Take 1 capsule (240 mg total) by mouth daily. 90 capsule 1   .  fluticasone (FLONASE) 50 MCG/ACT nasal spray 2 sprays by Nasal route daily. 16 g 2   . hydrochlorothiazide (MICROZIDE) 12.5 MG capsule Take 12.5 mg by Mouth Once a Day.     . pantoprazole (PROTONIX) 40 MG tablet Take 1 tablet (40 mg total) by mouth daily. 30 tablet 2   . valACYclovir HCL (VALTREX) 500 MG tablet Take 1 tablet (500 mg total) by mouth daily. 90 tablet 1     ALLERGIES:   Allergies   Allergen Reactions   . Amoxicillin-Pot Clavulanate      Other reaction(s): gi distress   . Aspirin      Other reaction(s): gi distress  Upsets stomach   . Statins      Other reaction(s): gi distress   . Sulfa Antibiotics      Other reaction(s): gi distress        Review of Systems  Constitutional: negative for fevers, night sweats  Respiratory: negative for SOB, cough  Cardiovascular: negative for chest pain, palpitations  Gastrointestinal: negative for nausea, vomiting  Genitourinary:negative for hematuria, dysuria  Musculoskeletal:negative for bone pain, myalgias and stiff joints  Neurological: negative for dizziness, gait problems, headaches and memory problems  Behavioral/Psych: negative for fatigue, loss of interest in favorite activities, separation anxiety and sleep disturbance  Endocrine: negative for temperature intolerance    Objective:    BP 137/82 mmHg  Pulse 66  Temp(Src) 97.7 F (36.5 C)  Ht 5\' 1"   Wt 213 lb  BMI 40.27 kg/m2  Body mass index is 40.27 kg/(m^2).  Weight: 213 lb       General Appearance:    Alert, cooperative, no distress, obese   Head:     Normocephalic, without obvious abnormality, atraumatic   Eyes:    No scleral icterus            Throat:   Lips, mucosa, and tongue normal; teeth and gums normal   Neck:   Supple, symmetrical, trachea midline, no adenopathy;        thyroid:  No enlargement/tenderness/nodules; no carotid    bruit or JVD   Back:     Symmetric, no curvature, ROM normal, no CVA tenderness   Lungs:     Clear to auscultation bilaterally, respirations unlabored   Chest wall:    No tenderness or deformity   Heart:    Regular rate and rhythm, S1 and S2 normal, no murmur, rub   or gallop   Abdomen:     Soft, right upper quadrant tenderness right over the costal margin, bowel sounds active all four quadrants,     no masses, no organomegaly   Extremities:   Extremities normal, atraumatic, no cyanosis or edema   Pulses:   2+ and symmetric all extremities   Skin:   Skin color, texture, turgor normal, no rashes or lesions   Lymph nodes:   Cervical, supraclavicular, and axillary nodes normal   Neurologic:   Alert and oriented x 3.  Able to move all extremities.       Data Review:     Orders Only on 02/26/2015   Component Date Value Ref Range Status   . Cholesterol 02/26/2015 235* 125 - 200 mg/dL Final   . Triglycerides 02/26/2015 70  <150 mg/dL Final   . HDL 09/81/1914 68  > or = 46 mg/dL Final   . LDL Calculated 02/26/2015 782* <130 mg/dL Final    Comment: Desirable range <100 mg/dL for patients with CHD or  diabetes and < 70 mg/dL  for diabetic patients with  known heart disease.     . CHOL/HDL Ratio 02/26/2015 3.5  0.0 - 5.0 (calc) Final   . Non HDL Chol. (LDL+VLDL) 02/26/2015 167*  Final    Comment: Target for non-HDL cholesterol is 30 mg/dL higher than  LDL cholesterol target.     Orders Only on 02/26/2015   Component Date Value Ref Range Status   . Protein, Total 02/26/2015 6.4  6.1 - 8.1 G/DL Final   . Albumin 47/82/9562 3.8  3.6 - 5.1 G/DL Final   . Globulin 13/06/6577 2.6  1.9 - 3.7 G/DL Final   . Albumin/Globulin Ratio 02/26/2015 1.5  1.0 -  2.5 Final   . Bilirubin, Total 02/26/2015 0.4  0.2 - 1.2 MG/DL Final   . Bilirubin, Direct 02/26/2015 0.1  0.0 - 0.2 MG/DL Final   . Bilirubin, Indirect 02/26/2015 0.3  0.2 - 1.2 MG/DL Final   . AST (SGOT) 46/96/2952 12  10 - 35 U/L Final   . ALT 02/26/2015 9  6 - 29 U/L Final   . Alkaline Phosphatase 02/26/2015 102  33 - 130 U/L Final   Office Visit on 02/06/2015   Component Date Value Ref Range Status   . Thyroid Stimulating Hormone 02/06/2015 1.07  0.35 - 4.94 uIU/mL Final   . Glucose 02/06/2015 89  70 - 100 mg/dL Final    Comment: Interpretive Data for Adult Female and Female Population  Indeterminate Range:  100-125 mg/dL  Equal to or greater than 126 mg/dL meets the ADA  guidelines for Diabetes Mellitus diagnosis if symptoms  are present and confirmed by repeat testing.  Random (Non-Fasting)Interpretive Data (Adults):  Equal to or greater than 200 mg/dL meets the ADA  guidelines for Diabetes Mellitus diagnosis if symptoms  are present and confirmed by Fasting Glucose or GTT.     . BUN 02/06/2015 11.0  7.0 - 19.0 mg/dL Final   . Creatinine 84/13/2440 0.7  0.4 - 1.5 mg/dL Final   . CALCIUM 08/29/2535 9.7  8.5 - 10.5 mg/dL Final   . Sodium 64/40/3474 141  135 - 146 mEq/L Final   . Potassium 02/06/2015 4.6  3.5 - 5.3 mEq/L Final   . Chloride 02/06/2015 104  100 - 111 mEq/L Final   . CO2 02/06/2015 32* 21 - 30 mEq/L Final   . Hemolysis Index 02/06/2015 9  0 - 18 Final   . EGFR 02/06/2015 >60.0   Final    Comment: Disease State Reference Ranges:    Chronic Kidney Disease; < 60 ml/min/1.73 sq.m    Kidney Failure; < 15 ml/min/1.73 sq.m    [Calculated using IDMS-Traceable MDRD equation (based on    gender, age and black vs. non-black race) recommended by    [employer] Kidney Disease Education Program. No data    available for non-white, non-black race.]  GFR estimates are unreliable in patients with:    Rapidly changing kidney function or recent dialysis,    extreme age, body size or body composition(obesity,    severe  malnutrition). Abnormal muscle mass (limb    amputation, muscle wasting). In these patients,    alternative determinations of GFR should be obtained.       .  Radiology Results   No results found.                Kayleen Alig R. Pourhsojae, DO, FACS, FASMBS

## 2015-03-21 ENCOUNTER — Encounter (INDEPENDENT_AMBULATORY_CARE_PROVIDER_SITE_OTHER): Payer: Self-pay | Admitting: Cardiology

## 2015-03-21 ENCOUNTER — Ambulatory Visit (INDEPENDENT_AMBULATORY_CARE_PROVIDER_SITE_OTHER): Payer: Commercial Managed Care - HMO | Admitting: Cardiology

## 2015-03-21 VITALS — BP 131/84 | HR 64 | Ht 61.0 in | Wt 210.0 lb

## 2015-03-21 DIAGNOSIS — E785 Hyperlipidemia, unspecified: Secondary | ICD-10-CM | POA: Insufficient documentation

## 2015-03-21 DIAGNOSIS — I10 Essential (primary) hypertension: Secondary | ICD-10-CM

## 2015-03-21 DIAGNOSIS — R002 Palpitations: Secondary | ICD-10-CM

## 2015-03-21 NOTE — Progress Notes (Signed)
Amber [name] 64 y.o. with a history of HTN presents for cardiac evaluation of palpitations    Cardiac work up has consisted of a cardiac cath ( 01/08/14 - normal ), echocardiogram ( EF 55%, mild TR), and event monitor ( no malignant arrhthymias )    Palpitations  - no syncope  - fast heart beat   - normal event monitor    No angina    HTN  - controlled    Patient denies any exertional chest pain, dyspnea, syncope, orthopnea, or paroxysmal nocturnal dyspnea.        Past Medical History   Diagnosis Date   . GERD (gastroesophageal reflux disease)    . Hiatal hernia    . Genital herpes    . TIA (transient ischemic attack)    . Hypertensive disorder    . Hyperlipidemia    . OSA on CPAP    . Osteoarthritis of knees, bilateral      Family History   Problem Relation Age of Onset   . Cancer Mother 107     breast cancer     Current Outpatient Prescriptions   Medication Sig Dispense Refill   . albuterol (PROVENTIL HFA;VENTOLIN HFA) 108 (90 BASE) MCG/ACT inhaler Inhale into the lungs.     Marland Kitchen aspirin EC 81 MG EC tablet Take 81 mg by mouth daily.     Marland Kitchen atenolol (TENORMIN) 50 MG tablet TAKE 1 TABLET BY MOUTH EVERY DAY 90 tablet 1   . cholecalciferol (VITAMIN D3) 1000 UNITS tablet Take 1,000 Units by Mouth Once a Day.     . desonide (DESOWEN) 0.05 % cream Apply topically 2 (two) times daily as needed. 60 g 2   . diazepam (VALIUM) 5 MG tablet Take 1 tablet (5 mg total) by mouth every 12 (twelve) hours as needed for Anxiety. 30 tablet 0   . diltiazem (DILACOR XR) 240 MG 24 hr capsule Take 1 capsule (240 mg total) by mouth daily. 90 capsule 1   . fluticasone (FLONASE) 50 MCG/ACT nasal spray 2 sprays by Nasal route daily. 16 g 2   . hydrochlorothiazide (MICROZIDE) 12.5 MG capsule Take 12.5 mg by Mouth Once a Day.     . lidocaine (LIDODERM) 5 % Place 1 patch onto the skin every 12 (twelve) hours as needed (pain). Remove & Discard patch within 12 hours or as directed by MD 30 patch 1   . pantoprazole  (PROTONIX) 40 MG tablet Take 1 tablet (40 mg total) by mouth daily. 30 tablet 2   . valACYclovir HCL (VALTREX) 500 MG tablet Take 1 tablet (500 mg total) by mouth daily. 90 tablet 1     No current facility-administered medications for this visit.          PE:    Filed Vitals:    03/21/15 1126   BP: 131/84   Pulse: 64     Body mass index is 39.7 kg/(m^2).    General:  Patient appears their stated age, well-nourished.  Alert and in no apparent distress.  Eyes: No conjunctivitis, no purulent discharge  ENT: Lips moist, color appropriate for race.  Respiratory: Clear to auscultation and percussion throughout. Respiratory effort unlabored, chest expansion symmetric.    Cardio: Regular rate and rhythm.  No carotid bruits or thrills, no JVD.  Extremities: warm, pulses 2+, no peripheral edema  GI: Soft, nondistended, nontender.  No guarding or rebound.  Skin: Color appropriate for race, Skin warm, dry, and intact  Psychiatric: Normal mood and  affect          Labs:  Lipid Panel   CHOLESTEROL   Date/Time Value Ref Range Status   02/26/2015 08:09 AM 235* 125 - 200 mg/dL Final     TRIGLYCERIDES   Date/Time Value Ref Range Status   02/26/2015 08:09 AM 70 <150 mg/dL Final   96/02/5408 81:19 AM 76 35 - 135 mg/dL Final     HDL   Date/Time Value Ref Range Status   02/26/2015 08:09 AM 68 > or = 46 mg/dL Final       CMP:   SODIUM   Date/Time Value Ref Range Status   02/06/2015 02:50 PM 141 135 - 146 mEq/L Final     POTASSIUM   Date/Time Value Ref Range Status   02/06/2015 02:50 PM 4.6 3.5 - 5.3 mEq/L Final     CHLORIDE   Date/Time Value Ref Range Status   02/06/2015 02:50 PM 104 100 - 111 mEq/L Final     CO2   Date/Time Value Ref Range Status   02/06/2015 02:50 PM 32* 21 - 30 mEq/L Final     GLUCOSE   Date/Time Value Ref Range Status   02/06/2015 02:50 PM 89 70 - 100 mg/dL Final     Comment:     Interpretive Data for Adult Female and Female Population  Indeterminate Range:  100-125 mg/dL  Equal to or greater than 126 mg/dL meets the  ADA  guidelines for Diabetes Mellitus diagnosis if symptoms  are present and confirmed by repeat testing.  Random (Non-Fasting)Interpretive Data (Adults):  Equal to or greater than 200 mg/dL meets the ADA  guidelines for Diabetes Mellitus diagnosis if symptoms  are present and confirmed by Fasting Glucose or GTT.       BUN   Date/Time Value Ref Range Status   02/06/2015 02:50 PM 11.0 7.0 - 19.0 mg/dL Final     PROTEIN, TOTAL   Date/Time Value Ref Range Status   02/26/2015 08:09 AM 6.4 6.1 - 8.1 G/DL Final   14/78/2956 21:30 PM 7.1 6.0 - 8.3 g/dL Final     ALKALINE PHOSPHATASE   Date/Time Value Ref Range Status   02/26/2015 08:09 AM 102 33 - 130 U/L Final     AST (SGOT)   Date/Time Value Ref Range Status   02/26/2015 08:09 AM 12 10 - 35 U/L Final     ALT   Date/Time Value Ref Range Status   02/26/2015 08:09 AM 9 6 - 29 U/L Final     ANION GAP   Date/Time Value Ref Range Status   03/10/2014 12:22 PM 11.0 5.0 - 15.0 Final       CBC:   WBC   Date/Time Value Ref Range Status   03/10/2014 12:22 PM 6.22 3.50 - 10.80 x10 3/uL Final   09/01/2012 03:03 AM 5.73 3.50 - 10.80 x10 3/uL Final   06/30/2009 04:03 PM 9.12 3.50 - 10.80 /CUMM Final     RBC   Date/Time Value Ref Range Status   03/10/2014 12:22 PM 5.03 4.20 - 5.40 x10 6/uL Final     HGB   Date/Time Value Ref Range Status   03/10/2014 12:22 PM 14.3 12.0 - 16.0 g/dL Final     HEMATOCRIT   Date/Time Value Ref Range Status   03/10/2014 12:22 PM 42.9 37.0 - 47.0 % Final     MCV   Date/Time Value Ref Range Status   03/10/2014 12:22 PM 85.3 80.0 - 100.0 fL Final     MCHC  Date/Time Value Ref Range Status   03/10/2014 12:22 PM 33.3 32.0 - 36.0 g/dL Final     RDW   Date/Time Value Ref Range Status   03/10/2014 12:22 PM 15 12 - 15 % Final     PLATELETS   Date/Time Value Ref Range Status   03/10/2014 12:22 PM 251 140 - 400 x10 3/uL Final           Impression / plan    Pre op evaluation   - under evaluation for weight reduction surgery   - ok to proceed with surgery / general  anesthesia from a cardiac standpoint   - no further cardiac work up warranted prior to surgery  HTN   - controlled   -The current medical regimen is effective;  continue present plan and medications.  Dyslipidemia   - can not tolerate statins   - has noted improvement in FLP with lifestyle modification - will follow, and if NO continued improvement, will start zetia        Daveion Robar Dan Humphreys  03/21/2015

## 2015-03-26 ENCOUNTER — Ambulatory Visit (INDEPENDENT_AMBULATORY_CARE_PROVIDER_SITE_OTHER): Payer: Commercial Managed Care - HMO

## 2015-03-26 DIAGNOSIS — Z6841 Body Mass Index (BMI) 40.0 and over, adult: Secondary | ICD-10-CM

## 2015-03-26 DIAGNOSIS — Z719 Counseling, unspecified: Secondary | ICD-10-CM

## 2015-03-26 NOTE — Progress Notes (Signed)
S: 64 year old female presents for initial nutrition consult.  Pt reports she has struggled with her weight for the past 20 years.  Pt reports her weight started increasing secondary to eating out of emotions.  Pt reports emotional eating attributed to traumatic events (loss of brother, mother, and premature birth of second child).  Pt reports weight had been as low as 204 pounds (last year); however, pt reports medical conditions have affected her ability to maintain weight and gradually weight started increasing.    Pt has tried the following diet and/or exercise programs (mostly self-directed, some formal): Vitamin B-12 shots, and self-monitoring calories/intake.    Pt interested in bypass surgery.       Meal pattern/Portions/Pace:  Pt reports she eats 3 meals/day; Pt reports portion sizes greater than standard; Pt reports meal pace: 30 minutes (pt reports she chews food well).  Eating Out Frequency: 2x/week.  Food Access/ Meal planning:  Pt reports she grocery shops q week; pt reports she does not plan meals but does cook and is primarily responsible for meals in home.  Pt reports all members of household open to healthier foods/cooking style.  Emotional Eating:  Pt reports emotional eating with stress.  Trigger Foods:  Potato chips  Vitamin/Mineral Supplements: Pt reports hx of Vit D deficiency (2,000 IU prescribed, however pt reported her system does not tolerate a lot of medications so she plans to resume taking Vit D-1,000IU).    Psychosocial: Pt reports she has a hx of seeing therapists for anxiety related issues and has no problems resuming therapy if needed after weight loss surgery to help maintain weight loss goals.    Lifestyle/Activity/Exercise:  Pt reports she is recently retired, and describes lifestyle as mildly active, pt reports she currently does not engage in any formal form of exercise.    Goals:     Pt states desires weight loss surgery to help symptoms of co-morbid conditions and live a  longer, more active lifestyle.  Pt attended pre-surgery classes at [facility] and stated she is aware of nutrition recommendations after weight loss surgery.  Pt states her readiness level to make any changes to reach her health goal, is  a 10.      Allergies:    Allergies   Allergen Reactions   . Amoxicillin-Pot Clavulanate      Other reaction(s): gi distress   . Aspirin      Other reaction(s): gi distress  Upsets stomach   . Statins      Other reaction(s): gi distress   . Sulfa Antibiotics      Other reaction(s): gi distress       O:  Ht:    Ht Readings from Last 1 Encounters:   03/21/15 5\' 1"      Wt:  218 lb BMI:  Body mass index is 41.21 kg/(m^2).   IBW:  105 pounds Excess Wt:  113 pounds  ABW: 150 pounds    Smoker:  never    Comorbidities:    Patient Active Problem List   Diagnosis   . Essential hypertension   . S/P spinal surgery   . Hyperlipidemia   . OAB (overactive bladder)   . Anxiety state, unspecified   . OSA (obstructive sleep apnea)   . Abnormal electrocardiogram   . Cerebral infarction   . Dyslipidemia   . Gastroesophageal reflux disease   . Hypernatremia   . Hypokalemia   . Pins and needles sensation   . Snoring   . Paresthesia   .  Shortness of breath   . Numbness   . Slow transit constipation   . Polypharmacy   . Vitamin D deficiency   . Overactive bladder   . Osteoarthritis   . S/P laparoscopic cholecystectomy   . Transient cerebral ischemia, unspecified type   . Palpitations   . Temporary cerebral vascular dysfunction   . Pituitary microadenoma   . Morbid obesity with BMI of 40.0-44.9, adult   . Dyslipidemia       Food Recall:     Breakfast:  1 pack Oatmeal (mixed with water); pure cane sugar; I can't believe it's butter & water     Lunch: 1/2 [employer] fried chicken breast (skin removed); 2 pieces wheat bread; string beans     Dinner:  Smoked Gouda Triscuits with Laughing Cow Swiss Cheese spread     Snacks:     Beverages:  water     Alcohol:No    A:  Pt with the condition of obesity related to food and  nutrition knowledge deficit, excessive energy intake (emotional eating) and limited physical activity (no exercise) as evidenced by  BMI, report, and recall.  Per report, pt has started to make changes towards her health goals.  Pt insurance requires pt to complete a 3 month program which will help provide pt with additional tools to help pt maintain weight loss after surgery.  .  Discussed necessary diet changes related to bariatric surgery.  Pt comprehension high based on report and prior completion of classes (3 years ago).  Anticipate compliance high, pt does have support or has identified when she needs additional emotional support.  Encouraged pt to attend support groups before surgery to include behavior.  Pt receives my recommendation as a good candidate for bariatric surgery.  Would recommend this pt for WLS.  Pt plans to start to begin process of developing self-monitoring habits by adding nutrition/exercise into journal started while in therapy.    P:  1.  Pt to continue with current weight loss program as required by [employer].    2.  Pt to review nutrition plan post - operatively and contact RD with questions.  Contact information provided.      Spent a total of 45 minutes educating pt in a individual one-on-one setting.

## 2015-03-26 NOTE — Patient Instructions (Signed)
Provided pt with [employer] handout and Support Group Flyers.  Provided pt with hard copy of instructions reviewed.  Pt verbalized understanding and desired compliance.  All questions or concerns answered.

## 2015-03-28 ENCOUNTER — Encounter (INDEPENDENT_AMBULATORY_CARE_PROVIDER_SITE_OTHER): Payer: Self-pay | Admitting: Internal Medicine

## 2015-04-04 ENCOUNTER — Ambulatory Visit (INDEPENDENT_AMBULATORY_CARE_PROVIDER_SITE_OTHER): Payer: Commercial Managed Care - HMO

## 2015-04-16 ENCOUNTER — Ambulatory Visit (INDEPENDENT_AMBULATORY_CARE_PROVIDER_SITE_OTHER): Payer: Commercial Managed Care - HMO

## 2015-04-16 DIAGNOSIS — Z719 Counseling, unspecified: Secondary | ICD-10-CM

## 2015-04-16 DIAGNOSIS — Z6841 Body Mass Index (BMI) 40.0 and over, adult: Secondary | ICD-10-CM

## 2015-04-16 NOTE — Progress Notes (Signed)
Class #2: Behavior Chains    S & O: Patient reports she has lost weight (3 pounds) since the previous class. Patient reports she has met with previous class goals.    A: Patient continue to demonstrate motivation to lose weight and change behaviors as evidenced by report and active class participation. Patient has mild nutrition knowledge deficit as evidenced per initial report and BMI. Patient was educated during the group session on emotions and how they affect weight balance. Discussion focused on:     1. Behavior Modification for weight loss  2. Personal planning to manage our own behavior chains affecting healthy lifestyle     Choices.  3. Pt completed goal setting worksheet to help shift habits towards achieving long-term weight loss maintenance goals.    P. Return in 1 month for Class #3 (knowing nutrients)     Spent a total of 15 minutes in individual one on one education setting.

## 2015-04-16 NOTE — Patient Instructions (Signed)
Provided pt with Behavior Chains  handout.  Pt reports she is going to start water walking, 3x/week.  Pt verbalized understanding and desired compliance.  All questions or concerns answered.

## 2015-05-08 ENCOUNTER — Ambulatory Visit (INDEPENDENT_AMBULATORY_CARE_PROVIDER_SITE_OTHER): Payer: Commercial Managed Care - HMO | Admitting: Internal Medicine

## 2015-05-09 ENCOUNTER — Ambulatory Visit (INDEPENDENT_AMBULATORY_CARE_PROVIDER_SITE_OTHER): Payer: Commercial Managed Care - HMO

## 2015-05-09 DIAGNOSIS — Z6841 Body Mass Index (BMI) 40.0 and over, adult: Secondary | ICD-10-CM

## 2015-05-09 DIAGNOSIS — Z713 Dietary counseling and surveillance: Secondary | ICD-10-CM

## 2015-05-09 DIAGNOSIS — Z719 Counseling, unspecified: Secondary | ICD-10-CM

## 2015-05-09 NOTE — Patient Instructions (Signed)
Provided pt with Knowing Nutrients handout.  Provided pt with meal pattern handout with recipes.  Pt verbalized understanding. All questions and concerns answered.

## 2015-05-09 NOTE — Progress Notes (Signed)
S:  Pt presented for 3 month class education group.  Pt had questions regarding diet, exercise and healthy live post operatively.    Previous Visits Wt:    Wt Readings from Last 10 Encounters:   05/09/15 216 lb 0.6 oz   04/16/15 215 lb 1.9 oz   03/26/15 218 lb   03/21/15 210 lb   03/18/15 213 lb   03/15/15 213 lb 3.2 oz   02/12/15 211 lb 9.6 oz   02/06/15 215 lb 12.8 oz   11/07/14 214 lb 12.8 oz   08/27/14 214 lb 12.8 oz       A:  Pt has mild nutrition knowledge deficit and was educated during the group session on a variety of topics including:  Behavior modifications for weight loss and identification of personal obstacles.  Goal setting for weight loss.  Physical activity after weight loss surgery.  Eating out after weight loss surgery.  Healthy cooking and shopping techniques.  Emotional eating and how to manage emotional/boredom eating.  Basic nutrition education:  Protein, fat, carbohydrates and nutrient dense food choices.    Importance of fiber, fluids and activity.    P:  Pt to continue with education classes as required by insurance company.  Pt provided contact information if needed.    Spent a total of 60 minutes educating pt in a group setting.

## 2015-05-14 ENCOUNTER — Other Ambulatory Visit (INDEPENDENT_AMBULATORY_CARE_PROVIDER_SITE_OTHER): Payer: Self-pay | Admitting: Internal Medicine

## 2015-05-14 MED ORDER — ATENOLOL 50 MG PO TABS
50.0000 mg | ORAL_TABLET | Freq: Every day | ORAL | Status: DC
Start: 2015-05-14 — End: 2015-06-10

## 2015-05-15 ENCOUNTER — Encounter (INDEPENDENT_AMBULATORY_CARE_PROVIDER_SITE_OTHER): Payer: Self-pay | Admitting: Family Medicine

## 2015-05-15 ENCOUNTER — Ambulatory Visit (INDEPENDENT_AMBULATORY_CARE_PROVIDER_SITE_OTHER): Payer: Commercial Managed Care - HMO | Admitting: Family Medicine

## 2015-05-15 VITALS — BP 147/87 | HR 75 | Temp 97.4°F | Ht 61.0 in | Wt 213.0 lb

## 2015-05-15 DIAGNOSIS — R079 Chest pain, unspecified: Secondary | ICD-10-CM

## 2015-05-15 DIAGNOSIS — R0602 Shortness of breath: Secondary | ICD-10-CM

## 2015-05-15 MED ORDER — CYCLOBENZAPRINE HCL 5 MG PO TABS
5.0000 mg | ORAL_TABLET | Freq: Three times a day (TID) | ORAL | Status: DC | PRN
Start: 2015-05-15 — End: 2015-10-03

## 2015-05-15 NOTE — Progress Notes (Signed)
Subjective:       Patient ID: Amber [name] is a 64 y.o. female.    HPI  Chief Complaint   Patient presents with   . ED follow up     chest pain, Acid Reflux   . Diarrhea     x2 days   f/u from ER. Seen for cp, sob.   D/c'd with ekg, cxr , card enzyme -ve findings   Increased to protonix bid. Denies any changes. Still considerable r sided cp  . Hurts to move, but also to breathe in deeply. Denies any pain to palpation.   No numbness/tingilng, hemoptysis. No palp.   Followed by cards. Prev stress -ve, cath -ve.   Followed by gi. Last OV 1 yr ago with EGD. Told reflux but no ulcer.  sts pain doesn't feel similar to pain caused by reflux.     Also notes diarrhea x 2 days.   No f/s/c. No n/v/  The following portions of the patient's history were reviewed and updated as appropriate: allergies, current medications, past family history, past medical history, past social history, past surgical history and problem list.    Review of Systems  ROS: as in HPI       Patient Active Problem List   Diagnosis   . Essential hypertension   . S/P spinal surgery   . Hyperlipidemia   . OAB (overactive bladder)   . Anxiety state, unspecified   . OSA (obstructive sleep apnea)   . Abnormal electrocardiogram   . Cerebral infarction   . Dyslipidemia   . Gastroesophageal reflux disease   . Hypernatremia   . Hypokalemia   . Pins and needles sensation   . Snoring   . Paresthesia   . Shortness of breath   . Numbness   . Slow transit constipation   . Polypharmacy   . Vitamin D deficiency   . Overactive bladder   . Osteoarthritis   . S/P laparoscopic cholecystectomy   . Transient cerebral ischemia, unspecified type   . Palpitations   . Temporary cerebral vascular dysfunction   . Pituitary microadenoma   . Morbid obesity with BMI of 40.0-44.9, adult   . Dyslipidemia     Allergies   Allergen Reactions   . Amoxicillin-Pot Clavulanate      Other reaction(s): gi distress   . Aspirin      Other reaction(s): gi distress  Upsets stomach   . Statins       Other reaction(s): gi distress   . Sulfa Antibiotics      Other reaction(s): gi distress     Problem list and Allergies reviewed.     Past Medical History   Diagnosis Date   . GERD (gastroesophageal reflux disease)    . Hiatal hernia    . Genital herpes    . TIA (transient ischemic attack)    . Hypertensive disorder    . Hyperlipidemia    . OSA on CPAP    . Osteoarthritis of knees, bilateral      Past Surgical History   Procedure Laterality Date   . Ovary surgery Right    . Appendectomy     . Fallopian tube surgery     . Spine surgery  11/2013     cervical HNP x 2, Dr. Bufford Buttner   . Laparoscopic, cholecystectomy, cholangiogram  08/13/2014     Family History   Problem Relation Age of Onset   . Cancer Mother 71     breast cancer  History     Social History   . Marital Status: Single     Spouse Name: N/A   . Number of Children: 2   . 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 Smoking status: Never Smoker    . Smokeless tobacco: Never Used   . Alcohol Use: No   . Drug Use: No   . Sexual Activity: Not on file     Other Topics Concern   . Dietary Supplements / Vitamins Yes   . Anesthesia Problems No   . Blood Thinners No   . Eats Large Amounts No   . Excessive Sweets No   . Skips Meals No   . Eats Excessive Starches Yes   . Snacks Or Grazes No   . Emotional Eater Yes   . Eats Fried Food Yes   . Eats Fast Food Yes   . Diet Center No   . Hmr No   . [name] No   . La Weight Loss No   . Nutri-System No   . Opti-Fast / Medi-Fast No   . Overeaters Anonymous No   . [facility] Weight Loss Center No   . Tops No   . Weight Watchers No   . Atkins No   . Binging / Purging No   . Body For Life No   . Cabbage Soup No   . Calorie Counting No   . Fasting No   . [name] No   . Health Spa No   . Herbal Life No   . High Protein No   . Low Carb No   . Low Fat No   . [facility] Diet No   . [place] Diet No   . Margie Billet Diet No   . [place] Diet No   .  Slim Fast No   . [place] No   . Sugar Busters No   . Vomiting No   . Zone Diet No   . Stationary Cycle Or Treadmill No   . Gym/Fitness Classes No   . Home Exercise/Video No   . Swimming No   . Team Sports No   . Weight Training No   . Walking Or Running No   . Hospitalization No   . Hypnosis No   . Physical Therapy No   . Psychological Therapy No   . Residential Program No   . Acutrim No   . Amphetamines No   . Anorex No   . Byetta No   . Dexatrim No   . Didrex No   . Fastin No   . Fen - Phen No   . Ionamin / Adipex No   . Mazanor No   . Meridia No   . Obalan No   . Phendiet No   . Phentrol No   . Phenteramine Yes   . Plegine No   . Pondimin No   . Qsymia No   . Prozac No   . Redux No   . Sanorex No   . Tenuate No   . Tepanole No   . Wechless No   . Wellbutrin No   . Xenical (Orlistat, Alli) No   . Other Med No   . No Impairment Yes     Chronic Bilat Knee Pain   . Walks With Cane/Crutch Yes   . Requires A Wheelchair  No   . Bedridden No   . Are You Currently Being Treated For Depression? No   . Do You Snore? Yes   . Are You Receiving Any Medical Or Psychological Services? No   . Do You Have Or Have You Been Treated For An Eating Disorder? No   . Do You Exercise Regularly? No   . Have You Or Family Member Ever Have Trouble With Anesthesia? No     Social History Narrative       Current outpatient prescriptions:   .  albuterol (PROVENTIL HFA;VENTOLIN HFA) 108 (90 BASE) MCG/ACT inhaler, Inhale into the lungs., Disp: , Rfl:   .  aspirin EC 81 MG EC tablet, Take 81 mg by mouth daily., Disp: , Rfl:   .  atenolol (TENORMIN) 50 MG tablet, Take 1 tablet (50 mg total) by mouth daily., Disp: 30 tablet, Rfl: 0  .  cholecalciferol (VITAMIN D3) 1000 UNITS tablet, Take 1,000 Units by Mouth Once a Day., Disp: , Rfl:   .  desonide (DESOWEN) 0.05 % cream, Apply topically 2 (two) times daily as needed., Disp: 60 g, Rfl: 2  .  diazepam (VALIUM) 5 MG tablet, Take 1 tablet (5 mg total) by mouth every 12 (twelve) hours as needed for  Anxiety., Disp: 30 tablet, Rfl: 0  .  diltiazem (DILACOR XR) 240 MG 24 hr capsule, Take 1 capsule (240 mg total) by mouth daily., Disp: 90 capsule, Rfl: 1  .  fluticasone (FLONASE) 50 MCG/ACT nasal spray, 2 sprays by Nasal route daily., Disp: 16 g, Rfl: 2  .  hydrochlorothiazide (MICROZIDE) 12.5 MG capsule, Take 12.5 mg by Mouth Once a Day., Disp: , Rfl:   .  lidocaine (LIDODERM) 5 %, Place 1 patch onto the skin every 12 (twelve) hours as needed (pain). Remove & Discard patch within 12 hours or as directed by MD, Disp: 30 patch, Rfl: 1  .  pantoprazole (PROTONIX) 40 MG tablet, Take 1 tablet (40 mg total) by mouth daily., Disp: 30 tablet, Rfl: 2  .  valACYclovir HCL (VALTREX) 500 MG tablet, Take 1 tablet (500 mg total) by mouth daily., Disp: 90 tablet, Rfl: 1  .  cyclobenzaprine (FLEXERIL) 5 MG tablet, Take 1 tablet (5 mg total) by mouth 3 (three) times daily as needed for Muscle spasms., Disp: 30 tablet, Rfl: 0  Family and Social history reviewed.   Medications reviewed         BP 147/87 mmHg  Pulse 75  Temp(Src) 97.4 F (36.3 C) (Oral)  Ht 1.549 m (5\' 1" )  Wt 96.616 kg (213 lb)  BMI 40.27 kg/m2   Filed Vitals:    05/15/15 1042   BP: 147/87   Pulse: 75   Temp: 97.4 F (36.3 C)   TempSrc: Oral   Height: 1.549 m (5\' 1" )   Weight: 96.616 kg (213 lb)       Nursing note reviewed. Vital signs reviewed.         Objective:    Physical Exam   Constitutional: She is oriented to person, place, and time. She appears well-developed and well-nourished. She appears distressed.   HENT:   Head: Normocephalic and atraumatic.   Eyes: Conjunctivae are normal. Pupils are equal, round, and reactive to light.   Cardiovascular: Normal rate, regular rhythm and normal heart sounds.  Exam reveals no gallop and no friction rub.    No murmur heard.  Pulmonary/Chest: Effort normal and breath sounds normal. No respiratory distress. She has no wheezes. She  exhibits no tenderness.       Appears uncomfortable while sitting, breathing in  deeply.   Area of concern: non tender to palpation nor manipulation.      Abdominal: Soft. Bowel sounds are normal. She exhibits no distension. There is no tenderness.   Musculoskeletal: She exhibits edema.   Neurological: She is alert and oriented to person, place, and time.   Skin: Skin is warm and dry. She is not diaphoretic.   Psychiatric: She has a normal mood and affect. Her behavior is normal.     Assessment and Plan:  Shyanna was seen today for ed follow up and diarrhea.    Diagnoses and all orders for this visit:    Chest pain, unspecified chest pain type  Orders:  -     CT Angiogram Chest  -     cyclobenzaprine (FLEXERIL) 5 MG tablet; Take 1 tablet (5 mg total) by mouth 3 (three) times daily as needed for Muscle spasms.    SOB (shortness of breath)  Orders:  -     CT Angiogram Chest  -     cyclobenzaprine (FLEXERIL) 5 MG tablet; Take 1 tablet (5 mg total) by mouth 3 (three) times daily as needed for Muscle spasms.          Cp, sob --- concern for pe. Pt sent to ER for cta.   ddx of chest pain includes costochondritis ---  Trial of otc ibu, + flexeril. Pt seen by gi. On protonix.   Pt strongly advised that if chest pain continues, or worsens, if she develops sob,palpitations, diaphoresis, nausea, vomiting, numbness/ tingling or abdominal pain to call 911/ go to ER immediately. Pt expresses understanding and intent to comply. All questions asked, were answered.           Can take otc immodium re: diarrhea.       Return in about 1 week (around 05/22/2015).         Procedures            c

## 2015-05-16 ENCOUNTER — Other Ambulatory Visit (INDEPENDENT_AMBULATORY_CARE_PROVIDER_SITE_OTHER): Payer: Self-pay | Admitting: Internal Medicine

## 2015-05-16 NOTE — Addendum Note (Signed)
Addended by: Ricke Hey on: 05/16/2015 01:34 PM     Modules accepted: Orders

## 2015-05-16 NOTE — Telephone Encounter (Signed)
Pt called for result and need refill MICROZIDE) 12.5 MG the doctor she was getting it from told her to go to her pcp

## 2015-05-19 MED ORDER — HYDROCHLOROTHIAZIDE 12.5 MG PO CAPS
12.5000 mg | ORAL_CAPSULE | Freq: Every morning | ORAL | Status: DC
Start: 2015-05-19 — End: 2015-06-17

## 2015-05-22 ENCOUNTER — Ambulatory Visit (INDEPENDENT_AMBULATORY_CARE_PROVIDER_SITE_OTHER): Payer: Commercial Managed Care - HMO | Admitting: Family Medicine

## 2015-06-04 ENCOUNTER — Encounter (INDEPENDENT_AMBULATORY_CARE_PROVIDER_SITE_OTHER): Payer: Self-pay

## 2015-06-09 ENCOUNTER — Other Ambulatory Visit (INDEPENDENT_AMBULATORY_CARE_PROVIDER_SITE_OTHER): Payer: Self-pay | Admitting: Family Medicine

## 2015-06-10 ENCOUNTER — Other Ambulatory Visit (INDEPENDENT_AMBULATORY_CARE_PROVIDER_SITE_OTHER): Payer: Self-pay | Admitting: Internal Medicine

## 2015-06-10 MED ORDER — ATENOLOL 50 MG PO TABS
50.0000 mg | ORAL_TABLET | Freq: Every day | ORAL | Status: DC
Start: 2015-06-10 — End: 2015-09-05

## 2015-06-13 ENCOUNTER — Ambulatory Visit (INDEPENDENT_AMBULATORY_CARE_PROVIDER_SITE_OTHER): Payer: Commercial Managed Care - HMO | Admitting: Internal Medicine

## 2015-06-13 ENCOUNTER — Encounter (INDEPENDENT_AMBULATORY_CARE_PROVIDER_SITE_OTHER): Payer: Self-pay | Admitting: Internal Medicine

## 2015-06-13 VITALS — BP 139/83 | HR 63 | Temp 97.9°F | Resp 12 | Ht 61.0 in | Wt 218.0 lb

## 2015-06-13 DIAGNOSIS — R079 Chest pain, unspecified: Secondary | ICD-10-CM

## 2015-06-13 DIAGNOSIS — I10 Essential (primary) hypertension: Secondary | ICD-10-CM

## 2015-06-13 DIAGNOSIS — K219 Gastro-esophageal reflux disease without esophagitis: Secondary | ICD-10-CM

## 2015-06-13 DIAGNOSIS — E785 Hyperlipidemia, unspecified: Secondary | ICD-10-CM

## 2015-06-13 NOTE — Progress Notes (Signed)
Subjective:       Patient ID: Amber [name] is a 64 y.o. female.    HPI  Pt is here for follow up   1. Intermittent non cardiac chest pain x few months, previous neg card w/u. ?esophageal spasm,  who was referered by GI to  Dr. Loyal Gambler at [facility] for further evaluation,   2. HTN, home BP stable,   3. GERD, stable with med,   4. Hyperlipidemia, reported recent labs in 5/16 done via card Dr. Katrinka Blazing, no med at this time.   The following portions of the patient's history were reviewed and updated as appropriate: allergies, current medications, past family history, past medical history, past social history, past surgical history and problem list.    Review of Systems   Constitutional: Negative for fever, chills, appetite change and unexpected weight change.   Respiratory: Negative for shortness of breath.    Cardiovascular: Positive for chest pain. Negative for palpitations.   Gastrointestinal: Negative for nausea, vomiting and abdominal pain.   Genitourinary: Negative for dysuria and frequency.   Neurological: Negative for syncope and light-headedness.     BP 139/83 mmHg  Pulse 63  Temp(Src) 97.9 F (36.6 C) (Oral)  Resp 12  Ht 1.549 m (5\' 1" )  Wt 98.884 kg (218 lb)  BMI 41.21 kg/m2       Objective:    Physical Exam   Constitutional: She appears well-developed. No distress.   obese   HENT:   Mouth/Throat: Oropharynx is clear and moist. No oropharyngeal exudate.   Eyes: Conjunctivae are normal. No scleral icterus.   Neck: Neck supple. No JVD present. Carotid bruit is not present. No thyromegaly present.   Cardiovascular: Normal rate, regular rhythm, normal heart sounds and intact distal pulses.  Exam reveals no gallop and no friction rub.    No murmur heard.  Pulmonary/Chest: Effort normal and breath sounds normal. No respiratory distress. She has no rales.   Abdominal: Soft. Bowel sounds are normal. She exhibits no distension. There is no tenderness. There is no rebound and no guarding.   Musculoskeletal: She  exhibits no edema.   Neurological: She is alert.           Assessment:       1. Chest pain, unspecified chest pain type     2. Essential hypertension     3. Hyperlipidemia, unspecified hyperlipidemia     4. Gastroesophageal reflux disease, esophagitis presence not specified             Plan:      Procedures    Continue meds  Low chol diet, exercise, weight reduction  Monitor BP  Keep f/u with specialists as directed  F/u 6 months or sooner prn,

## 2015-06-13 NOTE — Progress Notes (Signed)
1. Have you self referred yourself since we last saw you? NO    Refer to care team   Or  Add specialists:

## 2015-06-14 ENCOUNTER — Encounter (INDEPENDENT_AMBULATORY_CARE_PROVIDER_SITE_OTHER): Payer: Self-pay

## 2015-06-14 ENCOUNTER — Encounter (INDEPENDENT_AMBULATORY_CARE_PROVIDER_SITE_OTHER): Payer: Self-pay | Admitting: Internal Medicine

## 2015-06-17 ENCOUNTER — Other Ambulatory Visit (INDEPENDENT_AMBULATORY_CARE_PROVIDER_SITE_OTHER): Payer: Self-pay | Admitting: Internal Medicine

## 2015-06-17 ENCOUNTER — Other Ambulatory Visit (INDEPENDENT_AMBULATORY_CARE_PROVIDER_SITE_OTHER): Payer: Self-pay | Admitting: Family Medicine

## 2015-06-17 MED ORDER — HYDROCHLOROTHIAZIDE 12.5 MG PO CAPS
12.5000 mg | ORAL_CAPSULE | Freq: Every morning | ORAL | Status: DC
Start: 2015-06-17 — End: 2015-12-13

## 2015-06-18 ENCOUNTER — Other Ambulatory Visit (HOSPITAL_BASED_OUTPATIENT_CLINIC_OR_DEPARTMENT_OTHER): Payer: Self-pay

## 2015-06-20 ENCOUNTER — Other Ambulatory Visit (HOSPITAL_BASED_OUTPATIENT_CLINIC_OR_DEPARTMENT_OTHER): Payer: Self-pay

## 2015-06-20 ENCOUNTER — Other Ambulatory Visit (INDEPENDENT_AMBULATORY_CARE_PROVIDER_SITE_OTHER): Payer: Self-pay | Admitting: Internal Medicine

## 2015-06-20 DIAGNOSIS — K219 Gastro-esophageal reflux disease without esophagitis: Secondary | ICD-10-CM

## 2015-06-24 ENCOUNTER — Ambulatory Visit (INDEPENDENT_AMBULATORY_CARE_PROVIDER_SITE_OTHER): Payer: Commercial Managed Care - HMO

## 2015-06-26 NOTE — Progress Notes (Signed)
Pt presented for a final weight in for insurance.  Pt continues with her self-selected goals and has no additional questions.  No charge captured at this visit.

## 2015-07-02 ENCOUNTER — Encounter (INDEPENDENT_AMBULATORY_CARE_PROVIDER_SITE_OTHER): Payer: Commercial Managed Care - HMO | Admitting: Internal Medicine

## 2015-07-03 ENCOUNTER — Encounter (INDEPENDENT_AMBULATORY_CARE_PROVIDER_SITE_OTHER): Payer: Self-pay

## 2015-07-03 ENCOUNTER — Institutional Professional Consult (permissible substitution) (INDEPENDENT_AMBULATORY_CARE_PROVIDER_SITE_OTHER): Payer: Commercial Managed Care - HMO

## 2015-07-03 ENCOUNTER — Ambulatory Visit (INDEPENDENT_AMBULATORY_CARE_PROVIDER_SITE_OTHER): Payer: Commercial Managed Care - HMO

## 2015-07-03 NOTE — Progress Notes (Signed)
Initial behavioral consult complete. Patient is a good candidate for surgery.  Demonstrated a good understanding of WLS and required lifestyle changes.      Pt is a single  Mother of 2 adult sons, one of whom lives with her.  She said her son and her cousins are supporting her.  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  Discussed motivating factors for wanting surgery and how to improve chances for long term success. Pt said that she gained most of her weight from eating a lot of fast food and take out, but despite the fact that she has cut those things out, her deteriorating health has made it difficult to lose weight.  She also needs knee surgery which she was told cannot happen until she loses weight. She said that she briefly struggled with anxiety in 2001 due to job stress and an ending relationship and briefly saw a therapist. However, she stepped down to a less stressful job and got out of the relationship and said she has not had any anxiety sx that she could not manage since that times. Denied substance use hx and hx of eating disorder. Reported no alcohol or tobacco.     Initial exercise consult complete.  Discussed pt perceived barriers to exercise and strategies to overcome.  Provided pt with a fitness/exercise calendar along with and individual schedule for exercise including day, time, length of session.  Due to physical limitations, pt agreed to do water walking 3x/week.

## 2015-07-05 ENCOUNTER — Encounter (INDEPENDENT_AMBULATORY_CARE_PROVIDER_SITE_OTHER): Payer: Self-pay

## 2015-07-10 ENCOUNTER — Encounter (INDEPENDENT_AMBULATORY_CARE_PROVIDER_SITE_OTHER): Payer: Self-pay

## 2015-07-10 NOTE — Progress Notes (Signed)
Name: Amber [name]  Date of Birth: 02-Mar-1951   Age: 64 y.o.   Gender: Female   Examiner: Galen Daft, Ph.D.  Date of Evaluation: 07/03/15    Introduction:     Amber [name] is a 64 y.o. year old female seeking psychological evaluation for weight loss surgery.  Amber [name]'s current understanding of the surgical process, pre and post-surgical requirements, behavioral changes, and long-term consequences is good.  Amber [name] appeard to be emotionally stable and cognitively able to understand and consent to surgery.    Assessments:  Clinical Interview  Surgical Weight Loss Psychological Screening Inventory    Recommendation for Surgery:    Test results: Given that there were no major testing response distortions testing results discussed below are valid and useful.       Amber [name] does not appear to be experiencing any psychological distress at this time.  She is not showing any major symptoms of depression or anxiety and there are no concerns regarding her safety.  No substance use or mental health issues were noted.  Scores suggest that she is likely to be compliant with the instructions provided by her health care team.  After reviewing her profile and discussing her psychosocial history, she appears to have the tools required to handle the Bariatric surgery process; however she is encouraged to engage in supportive group activities and individual counseling, should the need arise.    Behavioral Observations:    Amber [name] arrived in a timely manner and was alert, oriented in all spheres with a calm affect.  She was cooperative and demonstrated a positive attitude towards improving her health outcomes.      Submitted by:

## 2015-07-11 ENCOUNTER — Telehealth (INDEPENDENT_AMBULATORY_CARE_PROVIDER_SITE_OTHER): Payer: Self-pay

## 2015-07-11 NOTE — Telephone Encounter (Signed)
Pre-certification initiated for lap gastric bypass with Dr. Jeanell Sparrow.  Reference #: [id number]  Initial DOS: 08/27/2015.

## 2015-07-12 DIAGNOSIS — R519 Headache, unspecified: Secondary | ICD-10-CM | POA: Insufficient documentation

## 2015-07-15 ENCOUNTER — Encounter (INDEPENDENT_AMBULATORY_CARE_PROVIDER_SITE_OTHER): Payer: Self-pay | Admitting: Internal Medicine

## 2015-07-15 NOTE — Telephone Encounter (Signed)
Clinicals faxed on 07/12/2015.

## 2015-07-19 ENCOUNTER — Encounter (INDEPENDENT_AMBULATORY_CARE_PROVIDER_SITE_OTHER): Payer: Self-pay | Admitting: Internal Medicine

## 2015-07-19 ENCOUNTER — Ambulatory Visit (INDEPENDENT_AMBULATORY_CARE_PROVIDER_SITE_OTHER): Payer: Commercial Managed Care - HMO | Admitting: Internal Medicine

## 2015-07-19 VITALS — BP 140/83 | HR 76 | Temp 98.1°F | Resp 14 | Ht 61.0 in | Wt 214.0 lb

## 2015-07-19 DIAGNOSIS — G4700 Insomnia, unspecified: Secondary | ICD-10-CM

## 2015-07-19 DIAGNOSIS — I10 Essential (primary) hypertension: Secondary | ICD-10-CM

## 2015-07-19 DIAGNOSIS — J0100 Acute maxillary sinusitis, unspecified: Secondary | ICD-10-CM

## 2015-07-19 DIAGNOSIS — E876 Hypokalemia: Secondary | ICD-10-CM

## 2015-07-19 MED ORDER — AZITHROMYCIN 250 MG PO TABS
ORAL_TABLET | ORAL | Status: DC
Start: 2015-07-19 — End: 2015-09-20

## 2015-07-19 MED ORDER — ZOLPIDEM TARTRATE 5 MG PO TABS
5.0000 mg | ORAL_TABLET | Freq: Every evening | ORAL | Status: DC | PRN
Start: 2015-07-19 — End: 2015-09-25

## 2015-07-19 MED ORDER — POTASSIUM CHLORIDE CRYS ER 10 MEQ PO TBCR
10.0000 meq | EXTENDED_RELEASE_TABLET | Freq: Every day | ORAL | Status: DC
Start: 2015-07-19 — End: 2015-12-13

## 2015-07-19 NOTE — Progress Notes (Signed)
Subjective:       Patient ID: Amber [name] is a 64 y.o. female.    HPI  Pt c/o  1. Was adm at [facility] hosp x 1d, left side pain, numbness, headache,   CT head no acute problem, chest xray , labs neg. Sx better.   +cough x 1 week, productive, no fever, ?sob,   +nausea, no vomiting,   2. Sinus pain, congestion, not using flonase.   3. HTN, stable home BP  4. Borderline hypokalemia, last lab 3.5  5. Not sleeping well.   The following portions of the patient's history were reviewed and updated as appropriate: allergies, current medications, past family history, past medical history, past social history, past surgical history and problem list.    Review of Systems   Constitutional: Negative for fever, chills and appetite change.   HENT: Positive for congestion and sinus pressure.    Eyes: Negative for visual disturbance.   Respiratory: Negative for shortness of breath.    Cardiovascular: Negative for chest pain and palpitations.   Gastrointestinal: Negative for vomiting and abdominal pain.   Genitourinary: Negative for dysuria.   Neurological: Negative for weakness and numbness.     BP 140/83 mmHg  Pulse 76  Temp(Src) 98.1 F (36.7 C) (Oral)  Resp 14  Ht 1.549 m (5\' 1" )  Wt 97.07 kg (214 lb)  BMI 40.46 kg/m2       Objective:    Physical Exam   Constitutional: She appears well-developed. No distress.   HENT:   Right Ear: Tympanic membrane is not erythematous and not bulging.   Left Ear: Tympanic membrane is not erythematous and not bulging.   Nose: Right sinus exhibits maxillary sinus tenderness. Right sinus exhibits no frontal sinus tenderness. Left sinus exhibits maxillary sinus tenderness. Left sinus exhibits no frontal sinus tenderness.   Mouth/Throat: Oropharynx is clear and moist. No oropharyngeal exudate.   Eyes: Conjunctivae are normal. No scleral icterus.   Neck: Neck supple. No JVD present. Carotid bruit is not present. No thyromegaly present.   Cardiovascular: Normal rate, regular rhythm and normal  heart sounds.  Exam reveals no gallop and no friction rub.    No murmur heard.  Pulmonary/Chest: Effort normal and breath sounds normal. No respiratory distress. She has no wheezes. She has no rales.   Abdominal: Soft. Bowel sounds are normal. She exhibits no distension. There is no tenderness. There is no rebound and no guarding.   Musculoskeletal: She exhibits no edema.   Lymphadenopathy:     She has no cervical adenopathy.   Neurological: She is alert.   No focal weakness           Assessment:       1. Acute maxillary sinusitis, recurrence not specified  azithromycin (ZITHROMAX Z-PAK) 250 MG tablet   2. Insomnia, unspecified type  zolpidem (AMBIEN) 5 MG tablet   3. Hypokalemia     4. Essential hypertension  potassium chloride (K-TAB,KLOR-CON) 10 MEQ tablet           Plan:      Procedures    rx zpak, otc antitussive  rx ambien  rx KCl supplement  Continue meds  Monitor BP  F/u 3 months.

## 2015-07-19 NOTE — Progress Notes (Signed)
1. Have you self referred yourself since we last saw you? YES  07/12/15 - 07/13/15 [facility] - TIA    Refer to care team   Or  Add specialists:

## 2015-07-24 NOTE — Telephone Encounter (Signed)
Received call from [facility] with approval for lap gastric bypass with Dr. Jeanell Sparrow.  Approval number: [id number]  Initial DOS: 08/27/2015.  Approval will expire as of 01/21/2016.  Patient informed via MyChart.  Routed to surgical scheduler to schedule bariatric surgery.  DOS to be updated once surgery has been scheduled.

## 2015-07-29 ENCOUNTER — Encounter (INDEPENDENT_AMBULATORY_CARE_PROVIDER_SITE_OTHER): Payer: Self-pay | Admitting: Internal Medicine

## 2015-08-07 ENCOUNTER — Encounter (INDEPENDENT_AMBULATORY_CARE_PROVIDER_SITE_OTHER): Payer: Self-pay

## 2015-08-14 NOTE — Telephone Encounter (Signed)
Called insurance company to update date of surgery to: 10/22/2015.  Confirmed by rep, "Dee".  Authorization information entered into admission encounter.

## 2015-09-05 ENCOUNTER — Other Ambulatory Visit (INDEPENDENT_AMBULATORY_CARE_PROVIDER_SITE_OTHER): Payer: Self-pay | Admitting: Internal Medicine

## 2015-09-18 ENCOUNTER — Other Ambulatory Visit (INDEPENDENT_AMBULATORY_CARE_PROVIDER_SITE_OTHER): Payer: Self-pay | Admitting: Internal Medicine

## 2015-09-20 ENCOUNTER — Ambulatory Visit (INDEPENDENT_AMBULATORY_CARE_PROVIDER_SITE_OTHER): Payer: Commercial Managed Care - HMO | Admitting: Surgery

## 2015-09-20 ENCOUNTER — Encounter (INDEPENDENT_AMBULATORY_CARE_PROVIDER_SITE_OTHER): Payer: Self-pay | Admitting: Surgery

## 2015-09-20 ENCOUNTER — Encounter (INDEPENDENT_AMBULATORY_CARE_PROVIDER_SITE_OTHER): Payer: Self-pay

## 2015-09-20 VITALS — BP 142/82 | HR 71 | Temp 97.5°F | Ht 61.0 in | Wt 218.1 lb

## 2015-09-20 DIAGNOSIS — M170 Bilateral primary osteoarthritis of knee: Secondary | ICD-10-CM | POA: Insufficient documentation

## 2015-09-20 DIAGNOSIS — K2100 Gastro-esophageal reflux disease with esophagitis, without bleeding: Secondary | ICD-10-CM

## 2015-09-20 DIAGNOSIS — K449 Diaphragmatic hernia without obstruction or gangrene: Secondary | ICD-10-CM | POA: Insufficient documentation

## 2015-09-20 DIAGNOSIS — Z6841 Body Mass Index (BMI) 40.0 and over, adult: Secondary | ICD-10-CM

## 2015-09-20 NOTE — Progress Notes (Signed)
Assessment:  Morbid obesity  BMI of 40  Hypertension.  Obstructive sleep apnea on CPAP.  Gastroesophageal reflux disease with esophagitis.  Hiatal hernia.  Anxiety.  Vitamin D deficiency.  Osteoarthritis bilateral knees  History of transient ischemic attacks  Chest pain after eating        Plan:   [name]  is a 64 y.o. year old morbidly obese female who has failed multiple previous attempts at conservative weight loss and has opted to proceed with laparoscopic RNY-Gastric Bypass surgery.  All questions and concerns were addressed.    The patient will need several studies--labs, EKG, ultrasound, esophageal manometry, cardiac clearance, and medical clearance by their PCP.  Once complete, we will review all studies with the patient prior to surgery. We appreciate your assistance with any clearances prior to surgery.  2 weeks prior to surgery, they will consume clear liquid diet (Optifast).  My office will arrange this diet with the patient.  Patient wishes to proceed with above mentioned plan and surgery at this time, if cleared by gastroenterology, cardiology, and PCP.  Will await the results of the esophageal manometry and a clearances.  If you have any questions or concerns, please do not hesitate and contact me.  Thank you for allowing me to participate in their care.      HPI     [name]  is a pleasant 64 y.o. year old morbidly obese female  who has completed our comprehensive pre-operative program for weight loss surgery.  They have attended and completed the checklist necessary for surgery.  Currently, they are interested in the Laparoscopic RNY-Gastric Bypass.  I have reviewed all pertinent materials and have discussed with the patient along with any labs or diagnostics.        Bariatric comorbidities present: hypertension, GERD, osteoarthritis and obstructive sleep apnea  The following portions of the patient's history were reviewed and updated as appropriate:  allergies, current medications, past family history, past medical history, past social history, past surgical history and problem list.  CURRENT PROBLEM LIST:   Patient Active Problem List   Diagnosis   . Essential hypertension   . Hyperlipidemia   . Anxiety state, unspecified   . OSA (obstructive sleep apnea)   . Abnormal electrocardiogram   . Cerebral infarction   . Gastroesophageal reflux disease   . Hypernatremia   . Pins and needles sensation   . Snoring   . Paresthesia   . Numbness   . Slow transit constipation   . Polypharmacy   . Vitamin D deficiency   . Overactive bladder   . S/P laparoscopic cholecystectomy   . Transient cerebral ischemia, unspecified type   . Palpitations   . Temporary cerebral vascular dysfunction   . Pituitary microadenoma   . Morbid obesity with BMI of 40.0-44.9, adult   . Dyslipidemia   . Chest pain, unspecified chest pain type   . SOB (shortness of breath)   . Other long term (current) drug therapy   . Person with feared health complaint in whom no diagnosis is made   . Headache   . History of spinal surgery   . Obstructive sleep apnea syndrome   . Hiatal hernia   . Osteoarthritis of knees, bilateral     PAST MEDICAL HISTORY:   Past Medical History   Diagnosis Date   . GERD (gastroesophageal reflux disease)    . Hiatal hernia    . Genital herpes    .  TIA (transient ischemic attack)    . Hypertensive disorder    . Hyperlipidemia    . OSA on CPAP    . Osteoarthritis of knees, bilateral      PAST SURGICAL HISTORY:   Past Surgical History   Procedure Laterality Date   . Ovary surgery Right    . Appendectomy     . Fallopian tube surgery     . Spine surgery  11/2013     cervical HNP x 2, Dr. Bufford Buttner   . Laparoscopic, cholecystectomy, cholangiogram  08/13/2014     FAMILY HISTORY:    Family History   Problem Relation Age of Onset   . Cancer Mother 33     breast cancer     SOCIAL HISTORY:   Social History     Social History   . Marital Status: Single     Spouse Name: N/A   . Number of Children:  2   . 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 Smoking status: Never Smoker    . Smokeless tobacco: Never Used   . Alcohol Use: No   . Drug Use: No   . Sexual Activity: Not Asked     Other Topics Concern   . Dietary Supplements / Vitamins Yes   . Anesthesia Problems No   . Blood Thinners No   . Eats Large Amounts No   . Excessive Sweets No   . Skips Meals No   . Eats Excessive Starches Yes   . Snacks Or Grazes No   . Emotional Eater Yes   . Eats Fried Food Yes   . Eats Fast Food Yes   . Diet Center No   . Hmr No   . [name] No   . La Weight Loss No   . Nutri-System No   . Opti-Fast / Medi-Fast No   . Overeaters Anonymous No   . [facility] Weight Loss Center No   . Tops No   . Weight Watchers No   . Atkins No   . Binging / Purging No   . Body For Life No   . Cabbage Soup No   . Calorie Counting No   . Fasting No   . [name] No   . Health Spa No   . Herbal Life No   . High Protein No   . Low Carb No   . Low Fat No   . [facility] Diet No   . [place] Diet No   . Margie Billet Diet No   . [place] Diet No   . Slim Fast No   . [place] No   . Sugar Busters No   . Vomiting No   . Zone Diet No   . Stationary Cycle Or Treadmill No   . Gym/Fitness Classes No   . Home Exercise/Video No   . Swimming No   . Team Sports No   . Weight Training No   . Walking Or Running No   . Hospitalization No   . Hypnosis No   . Physical Therapy No   . Psychological Therapy No   . Residential Program No   . Acutrim No   . Amphetamines No   . Anorex No   . Byetta No   . Dexatrim No   . Didrex No   . Fastin No   .  Fen - Phen No   . Ionamin / Adipex No   . Mazanor No   . Meridia No   . Obalan No   . Phendiet No   . Phentrol No   . Phenteramine Yes   . Plegine No   . Pondimin No   . Qsymia No   . Prozac No   . Redux No   . Sanorex No   . Tenuate No   . Tepanole No   . Wechless No   . Wellbutrin No   . Xenical (Orlistat, Alli) No   . Other Med No   . No  Impairment Yes     Chronic Bilat Knee Pain   . Walks With Cane/Crutch Yes   . Requires A Wheelchair No   . Bedridden No   . Are You Currently Being Treated For Depression? No   . Do You Snore? Yes   . Are You Receiving Any Medical Or Psychological Services? No   . Do You Have Or Have You Been Treated For An Eating Disorder? No   . Do You Exercise Regularly? No   . Have You Or Family Member Ever Have Trouble With Anesthesia? No     Social History Narrative    (Does not refresh)  TOBACCO HISTORY:   History   Smoking status   . Never Smoker    Smokeless tobacco   . Never Used     ALCOHOL HISTORY:   History   Alcohol Use No     DRUG HISTORY:   History   Drug Use No     CURRENT HOSPITAL MEDICATIONS:   Current Outpatient Prescriptions   Medication Sig Dispense Refill   . albuterol (PROVENTIL HFA;VENTOLIN HFA) 108 (90 BASE) MCG/ACT inhaler Inhale into the lungs.     Marland Kitchen aspirin EC 81 MG EC tablet Take 81 mg by mouth daily.     Marland Kitchen atenolol (TENORMIN) 50 MG tablet TAKE 1 TABLET(50 MG) BY MOUTH DAILY 90 tablet 0   . cholecalciferol (VITAMIN D3) 1000 UNITS tablet Take 1,000 Units by Mouth Once a Day.     . cyclobenzaprine (FLEXERIL) 5 MG tablet Take 1 tablet (5 mg total) by mouth 3 (three) times daily as needed for Muscle spasms. 30 tablet 0   . desonide (DESOWEN) 0.05 % cream Apply topically 2 (two) times daily as needed. 60 g 2   . DILT-XR 240 MG 24 hr capsule TAKE 1 CAPSULE(240 MG) BY MOUTH DAILY 90 capsule 0   . fluticasone (FLONASE) 50 MCG/ACT nasal spray 2 sprays by Nasal route daily. 16 g 2   . hydrochlorothiazide (MICROZIDE) 12.5 MG capsule Take 1 capsule (12.5 mg total) by mouth every morning. 90 capsule 1   . lidocaine (LIDODERM) 5 % Place 1 patch onto the skin every 12 (twelve) hours as needed (pain). Remove & Discard patch within 12 hours or as directed by MD 30 patch 1   . pantoprazole (PROTONIX) 40 MG tablet Take 1 tablet (40 mg total) by mouth daily. 30 tablet 2   . potassium chloride (K-TAB,KLOR-CON) 10 MEQ tablet  Take 1 tablet (10 mEq total) by mouth daily. 90 tablet 1   . valACYclovir HCL (VALTREX) 500 MG tablet Take 1 tablet (500 mg total) by mouth daily. 90 tablet 1   . zolpidem (AMBIEN) 5 MG tablet Take 1 tablet (5 mg total) by mouth nightly as needed for Sleep. 30 tablet 1     No current facility-administered medications for this visit.  CURRENT OUTPATIENT MEDICATIONS:   Outpatient Prescriptions Marked as Taking for the 09/20/15 encounter (Office Visit) with Jalessa Peyser R, DO   Medication Sig Dispense Refill   . albuterol (PROVENTIL HFA;VENTOLIN HFA) 108 (90 BASE) MCG/ACT inhaler Inhale into the lungs.     Marland Kitchen aspirin EC 81 MG EC tablet Take 81 mg by mouth daily.     Marland Kitchen atenolol (TENORMIN) 50 MG tablet TAKE 1 TABLET(50 MG) BY MOUTH DAILY 90 tablet 0   . cholecalciferol (VITAMIN D3) 1000 UNITS tablet Take 1,000 Units by Mouth Once a Day.     . cyclobenzaprine (FLEXERIL) 5 MG tablet Take 1 tablet (5 mg total) by mouth 3 (three) times daily as needed for Muscle spasms. 30 tablet 0   . desonide (DESOWEN) 0.05 % cream Apply topically 2 (two) times daily as needed. 60 g 2   . DILT-XR 240 MG 24 hr capsule TAKE 1 CAPSULE(240 MG) BY MOUTH DAILY 90 capsule 0   . fluticasone (FLONASE) 50 MCG/ACT nasal spray 2 sprays by Nasal route daily. 16 g 2   . hydrochlorothiazide (MICROZIDE) 12.5 MG capsule Take 1 capsule (12.5 mg total) by mouth every morning. 90 capsule 1   . lidocaine (LIDODERM) 5 % Place 1 patch onto the skin every 12 (twelve) hours as needed (pain). Remove & Discard patch within 12 hours or as directed by MD 30 patch 1   . pantoprazole (PROTONIX) 40 MG tablet Take 1 tablet (40 mg total) by mouth daily. 30 tablet 2   . potassium chloride (K-TAB,KLOR-CON) 10 MEQ tablet Take 1 tablet (10 mEq total) by mouth daily. 90 tablet 1   . valACYclovir HCL (VALTREX) 500 MG tablet Take 1 tablet (500 mg total) by mouth daily. 90 tablet 1   . zolpidem (AMBIEN) 5 MG tablet Take 1 tablet (5 mg total) by mouth nightly as needed for  Sleep. 30 tablet 1     ALLERGIES:   Allergies   Allergen Reactions   . Acyclovir Nausea And Vomiting   . Amoxicillin-Pot Clavulanate      Other reaction(s): gi distress   . Aspirin Nausea And Vomiting     Other reaction(s): gi distress  Upsets stomach   . Atorvastatin      Palpitation   . Lovastatin Nausea And Vomiting   . Metronidazole Other (See Comments)   . Moxifloxacin Nausea And Vomiting     GI symptoms   . Other Nausea And Vomiting   . Rosuvastatin      Palpitation, chest pain, abd pain    . Statins Nausea And Vomiting     Other reaction(s): gi distress   . Sulfa Antibiotics Nausea And Vomiting     Other reaction(s): gi distress        Review of Systems  Constitutional: negative for fevers, night sweats  Respiratory: negative for SOB, cough  Cardiovascular: negative for chest pain, palpitations  Gastrointestinal: negative for nausea or vomiting.  Patient does get chest pain occasionally after eating.  She is scheduled to have esophageal manometry at [facility].  Genitourinary:negative for hematuria, dysuria  Musculoskeletal:negative for bone pain, myalgias and stiff joints  Neurological: negative for dizziness, gait problems, headaches and memory problems  Behavioral/Psych: negative for fatigue, loss of interest in favorite activities, separation anxiety and sleep disturbance  Endocrine: negative for temperature intolerance    Objective:    BP 142/82 mmHg  Pulse 71  Temp(Src) 97.5 F (36.4 C) (Oral)  Ht 5\' 1"   Wt 218 lb 1.3  oz  BMI 41.23 kg/m2  Body mass index is 41.23 kg/(m^2).  Weight: 218 lb 1.3 oz       General Appearance:    Alert, cooperative, no distress, obese   Head:    Normocephalic, without obvious abnormality, atraumatic   Eyes:    No scleral icterus            Throat:   Lips, mucosa, and tongue normal; teeth and gums normal   Neck:   Supple, symmetrical, trachea midline, no adenopathy;        thyroid:  No enlargement/tenderness/nodules; no carotid    bruit or JVD   Back:      Symmetric, no curvature, ROM normal, no CVA tenderness   Lungs:     Clear to auscultation bilaterally, respirations unlabored   Chest wall:    No tenderness or deformity   Heart:    Regular rate and rhythm, S1 and S2 normal, no murmur, rub   or gallop   Abdomen:     Soft, non-tender, bowel sounds active all four quadrants,     no masses, no organomegaly   Extremities:   Extremities normal, atraumatic, no cyanosis or edema   Pulses:   2+ and symmetric all extremities   Skin:   Skin color, texture, turgor normal, no rashes or lesions   Lymph nodes:   Cervical, supraclavicular, and axillary nodes normal   Neurologic:   Alert and oriented x 3.  Able to move all extremities.       Data Review:     No visits with results within 3 Month(s) from this visit.  Latest known visit with results is:    Orders Only on 02/26/2015   Component Date Value Ref Range Status   . Cholesterol 02/26/2015 235* 125 - 200 mg/dL Final   . Triglycerides 02/26/2015 70  <150 mg/dL Final   . HDL 16/08/9603 68  > or = 46 mg/dL Final   . LDL Calculated 02/26/2015 540* <130 mg/dL Final    Comment: Desirable range <100 mg/dL for patients with CHD or  diabetes and < 70 mg/dL for diabetic patients with  known heart disease.     . CHOL/HDL Ratio 02/26/2015 3.5  0.0 - 5.0 (calc) Final   . Non HDL Chol. (LDL+VLDL) 02/26/2015 167*  Final    Comment: Target for non-HDL cholesterol is 30 mg/dL higher than  LDL cholesterol target.       .  Radiology Results for the past 90 days.   @[id number] @          Trevion Hoben R. Pourhsojae, DO, FACS

## 2015-09-23 ENCOUNTER — Other Ambulatory Visit (HOSPITAL_BASED_OUTPATIENT_CLINIC_OR_DEPARTMENT_OTHER): Payer: Self-pay

## 2015-09-23 ENCOUNTER — Encounter (INDEPENDENT_AMBULATORY_CARE_PROVIDER_SITE_OTHER): Payer: Self-pay

## 2015-09-24 ENCOUNTER — Ambulatory Visit (INDEPENDENT_AMBULATORY_CARE_PROVIDER_SITE_OTHER): Payer: Commercial Managed Care - HMO | Admitting: Cardiology

## 2015-09-24 VITALS — BP 150/80

## 2015-09-24 DIAGNOSIS — R9431 Abnormal electrocardiogram [ECG] [EKG]: Secondary | ICD-10-CM

## 2015-09-24 DIAGNOSIS — Z01818 Encounter for other preprocedural examination: Secondary | ICD-10-CM

## 2015-09-24 DIAGNOSIS — I10 Essential (primary) hypertension: Secondary | ICD-10-CM

## 2015-09-25 ENCOUNTER — Other Ambulatory Visit (FREE_STANDING_LABORATORY_FACILITY): Payer: Commercial Managed Care - HMO

## 2015-09-25 ENCOUNTER — Ambulatory Visit (FREE_STANDING_LABORATORY_FACILITY): Payer: Commercial Managed Care - HMO | Admitting: Internal Medicine

## 2015-09-25 ENCOUNTER — Encounter (INDEPENDENT_AMBULATORY_CARE_PROVIDER_SITE_OTHER): Payer: Self-pay | Admitting: Internal Medicine

## 2015-09-25 VITALS — BP 141/78 | HR 63 | Temp 98.2°F | Resp 16 | Ht 61.0 in | Wt 216.0 lb

## 2015-09-25 DIAGNOSIS — Z01818 Encounter for other preprocedural examination: Secondary | ICD-10-CM

## 2015-09-25 DIAGNOSIS — K2100 Gastro-esophageal reflux disease with esophagitis, without bleeding: Secondary | ICD-10-CM

## 2015-09-25 DIAGNOSIS — Z6841 Body Mass Index (BMI) 40.0 and over, adult: Secondary | ICD-10-CM

## 2015-09-25 DIAGNOSIS — M170 Bilateral primary osteoarthritis of knee: Secondary | ICD-10-CM

## 2015-09-25 DIAGNOSIS — K449 Diaphragmatic hernia without obstruction or gangrene: Secondary | ICD-10-CM

## 2015-09-25 LAB — CBC
Hematocrit: 41 % (ref 37.0–47.0)
Hgb: 13.3 g/dL (ref 12.0–16.0)
MCH: 27.3 pg — ABNORMAL LOW (ref 28.0–32.0)
MCHC: 32.4 g/dL (ref 32.0–36.0)
MCV: 84 fL (ref 80.0–100.0)
MPV: 10.2 fL (ref 9.4–12.3)
Nucleated RBC: 0 /100 WBC (ref 0–1)
Platelets: 276 10*3/uL (ref 140–400)
RBC: 4.88 10*6/uL (ref 4.20–5.40)
RDW: 16 % — ABNORMAL HIGH (ref 12–15)
WBC: 4.23 10*3/uL (ref 3.50–10.80)

## 2015-09-25 LAB — COMPREHENSIVE METABOLIC PANEL
ALT: 12 U/L (ref 0–55)
AST (SGOT): 17 U/L (ref 5–34)
Albumin/Globulin Ratio: 1.2 (ref 0.9–2.2)
Albumin: 3.8 g/dL (ref 3.5–5.0)
Alkaline Phosphatase: 133 U/L — ABNORMAL HIGH (ref 37–106)
BUN: 11 mg/dL (ref 7.0–19.0)
Bilirubin, Total: 0.5 mg/dL (ref 0.1–1.2)
CO2: 28 mEq/L (ref 21–30)
Calcium: 9.8 mg/dL (ref 8.5–10.5)
Chloride: 106 mEq/L (ref 100–111)
Creatinine: 0.8 mg/dL (ref 0.4–1.5)
Globulin: 3.1 g/dL (ref 2.0–3.7)
Glucose: 86 mg/dL (ref 70–100)
Potassium: 3.7 mEq/L (ref 3.5–5.3)
Protein, Total: 6.9 g/dL (ref 6.0–8.3)
Sodium: 142 mEq/L (ref 135–146)

## 2015-09-25 LAB — IRON PROFILE
Iron Saturation: 26 % (ref 15–50)
Iron: 102 ug/dL (ref 40–145)
TIBC: 388 ug/dL (ref 265–497)
UIBC: 286 ug/dL (ref 126–382)

## 2015-09-25 LAB — LIPID PANEL
Cholesterol / HDL Ratio: 4
Cholesterol: 255 mg/dL — ABNORMAL HIGH (ref 0–199)
HDL: 64 mg/dL (ref 40–9999)
LDL Calculated: 178 mg/dL — ABNORMAL HIGH (ref 0–99)
Triglycerides: 63 mg/dL (ref 34–149)
VLDL Calculated: 13 mg/dL (ref 10–40)

## 2015-09-25 LAB — HEMOLYSIS INDEX: Hemolysis Index: 1 (ref 0–18)

## 2015-09-25 LAB — HEMOGLOBIN A1C
Average Estimated Glucose: 114 mg/dL
Hemoglobin A1C: 5.6 % (ref 4.6–5.9)

## 2015-09-25 LAB — PTH, INTACT: PTH Intact: 54 pg/mL (ref 9.0–72.0)

## 2015-09-25 LAB — APTT: PTT: 29 s (ref 23–37)

## 2015-09-25 LAB — GFR: EGFR: >60

## 2015-09-25 LAB — VITAMIN B12: Vitamin B-12: 475 pg/mL (ref 211–911)

## 2015-09-25 LAB — TSH: TSH: 0.88 u[IU]/mL (ref 0.35–4.94)

## 2015-09-25 LAB — FOLATE: Folate: 13.1 ng/mL

## 2015-09-25 NOTE — Progress Notes (Signed)
Subjective:       Patient ID: Amber [name] is a 64 y.o. female.    HPI  Pt is here for a pre-op medical clearance.   Scheduled for gastric bypass 10/22/15 with Dr. Jeanell Sparrow,     1. Saw card yesterday for cardiac clearance, reportedly ok  2. HTN, home BP stable,   3. OSA, on CPAP    Patient Active Problem List   Diagnosis   . Essential hypertension   . Anxiety state, unspecified   . Abnormal electrocardiogram   . Cerebral infarction   . Gastroesophageal reflux disease   . Hypernatremia   . Paresthesia   . Slow transit constipation   . Vitamin D deficiency   . Overactive bladder   . S/P laparoscopic cholecystectomy   . Transient cerebral ischemia, unspecified type   . Palpitations   . Pituitary microadenoma   . Morbid obesity with BMI of 40.0-44.9, adult   . Dyslipidemia   . Other long term (current) drug therapy   . Headache   . History of spinal surgery   . Obstructive sleep apnea syndrome   . Hiatal hernia   . Osteoarthritis of knees, bilateral      Past Medical History   Diagnosis Date   . GERD (gastroesophageal reflux disease)    . Hiatal hernia    . Genital herpes    . TIA (transient ischemic attack)    . Hypertensive disorder    . Hyperlipidemia    . OSA on CPAP    . Osteoarthritis of knees, bilateral       Past Surgical History   Procedure Laterality Date   . Ovary surgery Right    . Appendectomy     . Fallopian tube surgery     . Spine surgery  11/2013     cervical HNP x 2, Dr. Bufford Buttner   . Laparoscopic, cholecystectomy, cholangiogram  08/13/2014      Family History   Problem Relation Age of Onset   . Cancer Mother 37     breast cancer      Social History   Substance Use Topics   . Smoking status: Never Smoker    . Smokeless tobacco: Never Used   . Alcohol Use: No      Allergies  Acyclovir; Amoxicillin-pot clavulanate; Aspirin; Atorvastatin; Lovastatin; Metronidazole; Moxifloxacin; Other; Rosuvastatin; Statins; and Sulfa antibiotics    Current Outpatient Prescriptions   Medication Sig Dispense Refill   .  albuterol (PROVENTIL HFA;VENTOLIN HFA) 108 (90 BASE) MCG/ACT inhaler Inhale into the lungs.     Marland Kitchen aspirin EC 81 MG EC tablet Take 81 mg by mouth daily.     Marland Kitchen atenolol (TENORMIN) 50 MG tablet TAKE 1 TABLET(50 MG) BY MOUTH DAILY 90 tablet 0   . cyclobenzaprine (FLEXERIL) 5 MG tablet Take 1 tablet (5 mg total) by mouth 3 (three) times daily as needed for Muscle spasms. 30 tablet 0   . desonide (DESOWEN) 0.05 % cream Apply topically 2 (two) times daily as needed. 60 g 2   . DILT-XR 240 MG 24 hr capsule TAKE 1 CAPSULE(240 MG) BY MOUTH DAILY 90 capsule 0   . fluticasone (FLONASE) 50 MCG/ACT nasal spray 2 sprays by Nasal route daily. 16 g 2   . hydrochlorothiazide (MICROZIDE) 12.5 MG capsule Take 1 capsule (12.5 mg total) by mouth every morning. 90 capsule 1   . pantoprazole (PROTONIX) 40 MG tablet Take 1 tablet (40 mg total) by mouth daily. 30 tablet 2   .  valACYclovir HCL (VALTREX) 500 MG tablet Take 1 tablet (500 mg total) by mouth daily. 90 tablet 1   . cholecalciferol (VITAMIN D3) 1000 UNITS tablet Take 1,000 Units by Mouth Once a Day.     . potassium chloride (K-TAB,KLOR-CON) 10 MEQ tablet Take 1 tablet (10 mEq total) by mouth daily. 90 tablet 1     No current facility-administered medications for this visit.        Review of Systems   Constitutional: Negative for fever, chills and appetite change.   Respiratory: Negative for shortness of breath and wheezing.    Cardiovascular: Negative for chest pain and palpitations.   Gastrointestinal: Negative for nausea, vomiting and abdominal pain.   Genitourinary: Negative for dysuria.   Neurological: Negative for syncope, weakness, light-headedness and numbness.   Hematological: Does not bruise/bleed easily.     No prior problem with anesthesia    BP 141/78 mmHg  Pulse 63  Temp(Src) 98.2 F (36.8 C) (Oral)  Resp 16  Ht 1.549 m (5\' 1" )  Wt 97.977 kg (216 lb)  BMI 40.83 kg/m2        Objective:    Physical Exam   Constitutional: She appears well-developed. No distress.    obese   HENT:   Mouth/Throat: Oropharynx is clear and moist. No oropharyngeal exudate.   Eyes: Conjunctivae are normal. No scleral icterus.   Neck: Neck supple. No JVD present. Carotid bruit is not present. No thyromegaly present.   Cardiovascular: Normal rate, regular rhythm, normal heart sounds and intact distal pulses.  Exam reveals no gallop and no friction rub.    No murmur heard.  Pulmonary/Chest: Effort normal and breath sounds normal. No respiratory distress. She has no rales.   Abdominal: Soft. Bowel sounds are normal. She exhibits no distension. There is no tenderness. There is no rebound and no guarding.   Musculoskeletal: She exhibits tenderness. She exhibits no edema.   Neurological: She is alert.           Assessment:       1. Preop examination     2. Morbid obesity with BMI of 40.0-44.9, adult  Vitamin D- 25 Hydroxy (D2/D3/Total)    APTT    CANCELED: Vitamin A    CANCELED: Vitamin B1, Plasma    CANCELED: Lipid panel    CANCELED: Folate    CANCELED: Vitamin B12    CANCELED: CBC without differential    CANCELED: Comprehensive metabolic panel    CANCELED: Hemoglobin A1C    CANCELED: Copper, serum    CANCELED: PTH, Intact    CANCELED: IRON PROFILE    CANCELED: TSH   3. Hiatal hernia  Vitamin D- 25 Hydroxy (D2/D3/Total)    APTT    CANCELED: Vitamin A    CANCELED: Vitamin B1, Plasma    CANCELED: Lipid panel    CANCELED: Folate    CANCELED: Vitamin B12    CANCELED: CBC without differential    CANCELED: Comprehensive metabolic panel    CANCELED: Hemoglobin A1C    CANCELED: Copper, serum    CANCELED: PTH, Intact    CANCELED: IRON PROFILE    CANCELED: TSH   4. Primary osteoarthritis of both knees  Vitamin D- 25 Hydroxy (D2/D3/Total)    APTT    CANCELED: Vitamin A    CANCELED: Vitamin B1, Plasma    CANCELED: Lipid panel    CANCELED: Folate    CANCELED: Vitamin B12    CANCELED: CBC without differential    CANCELED: Comprehensive metabolic panel  CANCELED: Hemoglobin A1C    CANCELED: Copper, serum    CANCELED:  PTH, Intact    CANCELED: IRON PROFILE    CANCELED: TSH   5. Gastroesophageal reflux disease with esophagitis  Vitamin D- 25 Hydroxy (D2/D3/Total)    APTT    CANCELED: Vitamin A    CANCELED: Vitamin B1, Plasma    CANCELED: Lipid panel    CANCELED: Folate    CANCELED: Vitamin B12    CANCELED: CBC without differential    CANCELED: Comprehensive metabolic panel    CANCELED: Hemoglobin A1C    CANCELED: Copper, serum    CANCELED: PTH, Intact    CANCELED: IRON PROFILE    CANCELED: TSH           Plan:      Procedures    Cleared for surgery   Keep routine follow up.

## 2015-09-25 NOTE — Progress Notes (Signed)
1. Have you self referred yourself since we last saw you? No  Refer to care team   Or   Add specialists:

## 2015-09-27 ENCOUNTER — Encounter (INDEPENDENT_AMBULATORY_CARE_PROVIDER_SITE_OTHER): Payer: Self-pay | Admitting: Internal Medicine

## 2015-09-27 ENCOUNTER — Other Ambulatory Visit (INDEPENDENT_AMBULATORY_CARE_PROVIDER_SITE_OTHER): Payer: Self-pay | Admitting: Internal Medicine

## 2015-09-27 ENCOUNTER — Telehealth (INDEPENDENT_AMBULATORY_CARE_PROVIDER_SITE_OTHER): Payer: Self-pay | Admitting: Internal Medicine

## 2015-09-27 DIAGNOSIS — A6000 Herpesviral infection of urogenital system, unspecified: Secondary | ICD-10-CM

## 2015-09-27 LAB — COPPER, SERUM: Copper: 136 (ref 70–175)

## 2015-09-27 NOTE — Telephone Encounter (Signed)
Pt called need refill VALTREX) [phone]

## 2015-09-29 LAB — VITAMIN A: Vitamin A (Retinol): 44 (ref 38–98)

## 2015-09-30 LAB — VITAMIN D-25 HYDROXY (D2/D3/TOTAL)
25-Hydroxy D2: 17 ng/mL
Vitamin D, 25-OH, D3: 14 ng/mL
Vitamin D, 25-OH, Total: 31 ng/mL (ref 30–100)

## 2015-09-30 MED ORDER — VALACYCLOVIR HCL 500 MG PO TABS
500.0000 mg | ORAL_TABLET | Freq: Every day | ORAL | Status: DC
Start: 2015-09-30 — End: 2016-07-25

## 2015-10-01 LAB — VITAMIN B1, PLASMA: Vitamin B1 (Thiamine): 10 nmol/L (ref 8–30)

## 2015-10-02 ENCOUNTER — Other Ambulatory Visit (HOSPITAL_BASED_OUTPATIENT_CLINIC_OR_DEPARTMENT_OTHER): Payer: Self-pay

## 2015-10-03 ENCOUNTER — Encounter (INDEPENDENT_AMBULATORY_CARE_PROVIDER_SITE_OTHER): Payer: Self-pay | Admitting: Cardiology

## 2015-10-03 ENCOUNTER — Ambulatory Visit: Payer: Commercial Managed Care - HMO | Attending: Surgery

## 2015-10-03 NOTE — Pre-Procedure Instructions (Signed)
Dr Margie Billet office will scan preops directly into epic as available. Will contact his office closer to OR date for reaming preops. Patient emailed hibiclens instructions, preparing for surgery brochure, link to bariatric video and quiz

## 2015-10-03 NOTE — Progress Notes (Signed)
Amber [name] 64 y.o. with a history of HTN presents for cardiac evaluation prior to surgery     Pre op evaluation   - no cardiac symptoms at rest or moderae activity    Cardiac work up has consisted of   - cardiac cath ( 01/08/14 - normal )   - echocardiogram ( EF 55%, mild TR)    Palpitations  - no syncope  - fast heart beat    No angina    HTN  - controlled      Past Medical History   Diagnosis Date   . GERD (gastroesophageal reflux disease)    . Hiatal hernia    . Genital herpes      no current outbreak (10/03/15)   . Hyperlipidemia    . Osteoarthritis of knees, bilateral    . Nausea without vomiting    . OSA on CPAP 2015     CPAP nightly x 1 yr   . Difficulty walking      amb with cane secondary to arthritis bil knees   . Morbid obesity with BMI of 40.0-44.9, adult      BMI 40.9   . TIA (transient ischemic attack) 2014     TIA 2014-no residual. Patient evaulated for possible TIA 07/12/2015  @ [facility] (dischage summary in epic)-per summary CT of head was normal and patient was d/c'd   . Hypertensive disorder      Well controlled on med. Denies any SOB in last 6 months (10/03/15)   . Chest pain 2016      Chest pain after eating x6  months--being followed by GI for GERD     Family History   Problem Relation Age of Onset   . Cancer Mother 73     breast cancer     Current Outpatient Prescriptions   Medication Sig Dispense Refill   . albuterol (PROVENTIL HFA;VENTOLIN HFA) 108 (90 BASE) MCG/ACT inhaler Inhale into the lungs every 4 (four) hours as needed.        Marland Kitchen aspirin EC 81 MG EC tablet Take 81 mg by mouth daily.     Marland Kitchen atenolol (TENORMIN) 50 MG tablet TAKE 1 TABLET(50 MG) BY MOUTH DAILY (Patient taking differently: TAKE 1 TABLET(50 MG) BY MOUTH every morning) 90 tablet 0   . cholecalciferol (VITAMIN D3) 1000 UNITS tablet Take 1,000 Units by Mouth Once a Day.     . desonide (DESOWEN) 0.05 % cream Apply topically 2 (two) times daily as needed. 60 g 2   . DILT-XR 240 MG 24 hr capsule  TAKE 1 CAPSULE(240 MG) BY MOUTH DAILY (Patient taking differently: TAKE 1 CAPSULE(240 MG) BY MOUTH every morning) 90 capsule 0   . fluticasone (FLONASE) 50 MCG/ACT nasal spray 2 sprays by Nasal route daily. (Patient taking differently: 2 sprays by Nasal route as needed.   ) 16 g 2   . hydrochlorothiazide (MICROZIDE) 12.5 MG capsule Take 1 capsule (12.5 mg total) by mouth every morning. 90 capsule 1   . pantoprazole (PROTONIX) 40 MG tablet Take 1 tablet (40 mg total) by mouth daily. 30 tablet 2   . potassium chloride (K-TAB,KLOR-CON) 10 MEQ tablet Take 1 tablet (10 mEq total) by mouth daily. 90 tablet 1   . valACYclovir HCL (VALTREX) 500 MG tablet Take 1 tablet (500 mg total) by mouth daily. (Patient taking differently: Take 500 mg by mouth as needed.   ) 90 tablet 1     No current facility-administered medications for this visit.  PE:    Filed Vitals:    09/24/15 1335   BP: 150/80     There is no weight on file to calculate BMI.    General:  Patient appears their stated age, well-nourished.  Alert and in no apparent distress.  Eyes: No conjunctivitis, no purulent discharge  ENT: Lips moist, color appropriate for race.  Respiratory: Clear to auscultation and percussion throughout. Respiratory effort unlabored, chest expansion symmetric.    Cardio: Regular rate and rhythm.  No carotid bruits or thrills, no JVD.  Extremities: warm, pulses 2+, no peripheral edema  GI: Soft, nondistended, nontender.  No guarding or rebound.  Skin: Color appropriate for race, Skin warm, dry, and intact  Psychiatric: Normal mood and affect    EKG:    nsr    Labs:  Lipid Panel   CHOLESTEROL   Date/Time Value Ref Range Status   09/25/2015 11:15 AM 255* 0 - 199 mg/dL Final     TRIGLYCERIDES   Date/Time Value Ref Range Status   09/25/2015 11:15 AM 63 34 - 149 mg/dL Final   09/81/1914 78:29 AM 70 <150 mg/dL Final     HDL   Date/Time Value Ref Range Status   09/25/2015 11:15 AM 64 40 - 9999 mg/dL Final     Comment:     HDL:       Less  than 40 mg/dL - Major risk heart disease       Greater than or equal to 60 mg/dL - Negative risk factor       for heart disease         CMP:   SODIUM   Date/Time Value Ref Range Status   09/25/2015 11:15 AM 142 135 - 146 mEq/L Final     POTASSIUM   Date/Time Value Ref Range Status   09/25/2015 11:15 AM 3.7 3.5 - 5.3 mEq/L Final     CHLORIDE   Date/Time Value Ref Range Status   09/25/2015 11:15 AM 106 100 - 111 mEq/L Final     CO2   Date/Time Value Ref Range Status   09/25/2015 11:15 AM 28 21 - 30 mEq/L Final     GLUCOSE   Date/Time Value Ref Range Status   09/25/2015 11:15 AM 86 70 - 100 mg/dL Final     Comment:     ADA guidelines for diabetes mellitus:  Fasting:  Equal to or greater than 126 mg/dL  Random:   Equal to or greater than 200 mg/dL       BUN   Date/Time Value Ref Range Status   09/25/2015 11:15 AM 11.0 7.0 - 19.0 mg/dL Final     PROTEIN, TOTAL   Date/Time Value Ref Range Status   09/25/2015 11:15 AM 6.9 6.0 - 8.3 g/dL Final   56/21/3086 57:84 AM 6.4 6.1 - 8.1 G/DL Final     ALKALINE PHOSPHATASE   Date/Time Value Ref Range Status   09/25/2015 11:15 AM 133* 37 - 106 U/L Final     AST (SGOT)   Date/Time Value Ref Range Status   09/25/2015 11:15 AM 17 5 - 34 U/L Final     ALT   Date/Time Value Ref Range Status   09/25/2015 11:15 AM 12 0 - 55 U/L Final     ANION GAP   Date/Time Value Ref Range Status   03/10/2014 12:22 PM 11.0 5.0 - 15.0 Final       CBC:   WBC   Date/Time Value Ref Range Status   09/25/2015  11:15 AM 4.23 3.50 - 10.80 x10 3/uL Final   09/01/2012 03:03 AM 5.73 3.50 - 10.80 x10 3/uL Final   06/30/2009 04:03 PM 9.12 3.50 - 10.80 /CUMM Final     RBC   Date/Time Value Ref Range Status   09/25/2015 11:15 AM 4.88 4.20 - 5.40 x10 6/uL Final     HGB   Date/Time Value Ref Range Status   09/25/2015 11:15 AM 13.3 12.0 - 16.0 g/dL Final     HEMATOCRIT   Date/Time Value Ref Range Status   09/25/2015 11:15 AM 41.0 37.0 - 47.0 % Final     MCV   Date/Time Value Ref Range Status   09/25/2015 11:15 AM 84.0 80.0  - 100.0 fL Final     MCHC   Date/Time Value Ref Range Status   09/25/2015 11:15 AM 32.4 32.0 - 36.0 g/dL Final     RDW   Date/Time Value Ref Range Status   09/25/2015 11:15 AM 16* 12 - 15 % Final     PLATELETS   Date/Time Value Ref Range Status   09/25/2015 11:15 AM 276 140 - 400 x10 3/uL Final           Impression / plan    Pre op evaluation   - ok to proceed with surgery / general anesthesia from a cardiac perspective   - no further cardiac work up warranted prior to surgery   - normal cath 2015  HTN   - controlled   - The current medical regimen is effective;  continue present plan and medications.        Amber [name]  10/03/2015

## 2015-10-07 ENCOUNTER — Ambulatory Visit (INDEPENDENT_AMBULATORY_CARE_PROVIDER_SITE_OTHER): Payer: Commercial Managed Care - HMO

## 2015-10-07 DIAGNOSIS — Z6841 Body Mass Index (BMI) 40.0 and over, adult: Secondary | ICD-10-CM

## 2015-10-07 DIAGNOSIS — Z713 Dietary counseling and surveillance: Secondary | ICD-10-CM

## 2015-10-07 DIAGNOSIS — Z719 Counseling, unspecified: Secondary | ICD-10-CM

## 2015-10-07 NOTE — Progress Notes (Signed)
S:  Pt presents with questions about post op diet phases, supplements and lifestyle after weight loss surgery.  Pt presents for 2 hour pre operative education class.    A:  Pt has nutrition knowledge deficit regarding pre op and post op nutrition guidelines for weight loss surgery.  Pt has been educated on the following topics:  Anatomy review.  Necessary behavior/eating modifications to avoid complications.  Pre-op liquid diet phase.  Post op liquid diet phase  Post op mushy/soft foods diet phase  Vitamin/mineral supplementation and consequences of non compliance.  Reviewed symptoms of deficiencies.  Protein supplementation - requirements of protein supplements, amounts needed per surgery/pt, and consequences of non compliance.  Review of the nutrition fact panel, what to look for.  Review of dumping syndrome and it's effects in addition to trigger foods.  Review of possible post operative complications, tips/techniques to manage them.  Review of appropriate foods and meal plans for each diet phase.  Quick review of physical activity and the guidelines pt needs to follow post operatively.    P:  1.  Pt to follow up with MD and RD for final pre op visit.    2.  Contact information provided.  Pt to contact PRN.    Spent a total of 120 minutes educating pt in a group setting.

## 2015-10-07 NOTE — Patient Instructions (Signed)
Pt provided with pre operative nutrition class handbook/binder. Pt verbalized understanding of all necessary diet/nutrition protocol pre and post operatively. All questions or concerns answered.

## 2015-10-08 ENCOUNTER — Encounter (INDEPENDENT_AMBULATORY_CARE_PROVIDER_SITE_OTHER): Payer: Self-pay | Admitting: Cardiology

## 2015-10-09 ENCOUNTER — Encounter (INDEPENDENT_AMBULATORY_CARE_PROVIDER_SITE_OTHER): Payer: Self-pay

## 2015-10-11 ENCOUNTER — Telehealth (INDEPENDENT_AMBULATORY_CARE_PROVIDER_SITE_OTHER): Payer: Self-pay | Admitting: Family Medicine

## 2015-10-11 ENCOUNTER — Ambulatory Visit (INDEPENDENT_AMBULATORY_CARE_PROVIDER_SITE_OTHER): Payer: Commercial Managed Care - HMO | Admitting: Family Medicine

## 2015-10-11 VITALS — BP 133/69 | HR 71 | Temp 97.5°F | Resp 12 | Ht 61.0 in | Wt 212.0 lb

## 2015-10-11 DIAGNOSIS — H9201 Otalgia, right ear: Secondary | ICD-10-CM

## 2015-10-11 MED ORDER — FLUOCINOLONE ACETONIDE 0.01 % OT OIL
2.0000 [drp] | TOPICAL_OIL | Freq: Three times a day (TID) | OTIC | Status: AC
Start: 2015-10-11 — End: 2015-10-18

## 2015-10-11 NOTE — Progress Notes (Signed)
1. Have you self referred yourself since we last saw you? NO    Refer to care team   Or  Add specialists:

## 2015-10-12 ENCOUNTER — Telehealth (INDEPENDENT_AMBULATORY_CARE_PROVIDER_SITE_OTHER): Payer: Self-pay | Admitting: Family Medicine

## 2015-10-12 NOTE — Progress Notes (Signed)
Chief Complaint:  Right ear pain.    HPI:  This is a 64 yo female pt who coming today with c/o right ear pain, pt report that she felt on few occasions a sudden sharpa pain on right ear that last couple seconds and disappeared, pt report that this happen only since yesterday. Pt report that she [place] the BIPAP mask on daily basis due her OSA. And the strap usually running by her ears. Pt denies any cold symptoms, fever, chills, ear discharge, dizziness, hearing problems, headaches.         The following portions of the patient's history were reviewed and updated as appropriate: allergies, current medications, past family history, past medical history, past social history, past surgical history and problem list.    Medications:  Current Outpatient Prescriptions on File Prior to Visit   Medication Sig Dispense Refill   . albuterol (PROVENTIL HFA;VENTOLIN HFA) 108 (90 BASE) MCG/ACT inhaler Inhale into the lungs every 4 (four) hours as needed.        Marland Kitchen aspirin EC 81 MG EC tablet Take 81 mg by mouth daily.     Marland Kitchen atenolol (TENORMIN) 50 MG tablet TAKE 1 TABLET(50 MG) BY MOUTH DAILY (Patient taking differently: TAKE 1 TABLET(50 MG) BY MOUTH every morning) 90 tablet 0   . cholecalciferol (VITAMIN D3) 1000 UNITS tablet Take 1,000 Units by Mouth Once a Day.     . desonide (DESOWEN) 0.05 % cream Apply topically 2 (two) times daily as needed. 60 g 2   . DILT-XR 240 MG 24 hr capsule TAKE 1 CAPSULE(240 MG) BY MOUTH DAILY (Patient taking differently: TAKE 1 CAPSULE(240 MG) BY MOUTH every morning) 90 capsule 0   . fluticasone (FLONASE) 50 MCG/ACT nasal spray 2 sprays by Nasal route daily. (Patient taking differently: 2 sprays by Nasal route as needed.   ) 16 g 2   . hydrochlorothiazide (MICROZIDE) 12.5 MG capsule Take 1 capsule (12.5 mg total) by mouth every morning. 90 capsule 1   . pantoprazole (PROTONIX) 40 MG tablet Take 1 tablet (40 mg total) by mouth daily. 30 tablet 2   . potassium chloride (K-TAB,KLOR-CON) 10 MEQ tablet Take 1  tablet (10 mEq total) by mouth daily. 90 tablet 1   . valACYclovir HCL (VALTREX) 500 MG tablet Take 1 tablet (500 mg total) by mouth daily. (Patient taking differently: Take 500 mg by mouth as needed.   ) 90 tablet 1     No current facility-administered medications on file prior to visit.           ROS:  Constitution:  Patient denies appetite changes, chills, fatigue, fever.  Skin:  Patient denies skin changes, skin lesions or rashes.  Eyes:  Patient denies discharge, itching, redness, blurred vision.  HENT:  +right ear pain  Respiratory:  Patient denies SOB, tightness, cough, wheezing.  Cardiovascular:  Patient denies chest pain, leg swelling, palpitations.   Gastrointestinal:  Patient denies Abd distention/pain, Abd masses, blood in stool, rectal pain/swelling/bleeding, constipation, nausea, vomiting, diarrhea.  Genitourinary:  Patient denies dysuria, flank pain, hematuria, urgency  Musculoskeletal:  Patient denies arthralgias, back pain, gait problem, myalgias, neck pain/stiffness  Endocrine:  Patient denies any cold/heat intolerance, polydipsia, polyphagia, polyuria.  Neurological:  Patient denies weakness, numbness, tingling, facial drop.        Past Medical/social/social Hx:  Active Ambulatory Problems     Diagnosis Date Noted   . Essential hypertension 11/21/2013   . Anxiety state, unspecified 03/01/2014   . Abnormal electrocardiogram 01/08/2014   .  Cerebral infarction 04/15/2013   . Gastroesophageal reflux disease 04/02/2013   . Hypernatremia 11/11/2013   . Paresthesia 11/11/2013   . Slow transit constipation 07/27/2014   . Vitamin D deficiency 02/19/2014   . Overactive bladder 02/06/2014   . S/P laparoscopic cholecystectomy 08/27/2014   . Transient cerebral ischemia, unspecified type 11/07/2014   . Palpitations 02/12/2015   . Pituitary microadenoma 11/02/2004   . Morbid obesity with BMI of 40.0-44.9, adult 03/18/2015   . Dyslipidemia 03/21/2015   . Other long term (current) drug therapy 08/12/2014   .  Headache 07/12/2015   . History of spinal surgery 11/21/2013   . Obstructive sleep apnea syndrome 05/01/2014   . Hiatal hernia 09/20/2015   . Osteoarthritis of knees, bilateral 09/20/2015   . Pre-op evaluation 10/03/2015     Resolved Ambulatory Problems     Diagnosis Date Noted   . Esophageal reflux 11/21/2013   . Hypokalemia 04/02/2013   . RUQ abdominal pain 07/27/2014   . Right upper quadrant abdominal tenderness 08/08/2014   . Bilious vomiting 07/27/2014   . Right upper quadrant abdominal tenderness 08/08/2014     Past Medical History   Diagnosis Date   . GERD (gastroesophageal reflux disease)    . Genital herpes    . Hyperlipidemia    . Nausea without vomiting    . OSA on CPAP 2015   . Difficulty walking    . TIA (transient ischemic attack) 2014   . Hypertensive disorder    . Chest pain 2016           Physical Examination:  BP 133/69 mmHg  Pulse 71  Temp(Src) 97.5 F (36.4 C) (Oral)  Resp 12  Ht 1.549 m (5\' 1" )  Wt 96.163 kg (212 lb)  BMI 40.08 kg/m2  Pt alert, oriented x 3, no distress, comfortable  Skin: skin color, texture, turgor normal, no rashes or lesions.  Oropharynx: Lips, mucosa, and tongue normal; Teeth and gums normal  Eyes: Conjunctivae and corneas clear, PERRLA, OEM's intact  Ears: Normal TM's and clear external canal bilaterally  Neck: No adenopathy, no carotid bruit, no JVD, supple, symmetrical, trachea midline and Thyroid not enlarged, no tenderness or masses visible  Lungs: Breaths sounds present bilaterally, clear to auscultation, no wheezing, no rales, no rhonchi   Heart: Regular rate and rhythm, S1-S2 present, no murmurs, no rubs, no gallops         Assessment:    1. Right ear pain  Fluocinolone Acetonide 0.01 % Oil       Plan:      No significant finding on physical examination  Recommendation to control pain with acetaminophen as needed  Topical steroids for possible inflammatory process   Recommendations to avoid BIPAP mask strap against affected ear.   Come back if worsening or  new symptomatology

## 2015-10-12 NOTE — Telephone Encounter (Signed)
On call physician note:    Patient requesting alternative therapy to fluocinolone acetonide 0.01% oil due to $100 copay.  Discussed alternatives with pharmacist.  Verbal order given for dexamethasone otic 0.1% soln 2 gtt BID x 5 days    Dr. Glenice Laine

## 2015-10-14 ENCOUNTER — Encounter (INDEPENDENT_AMBULATORY_CARE_PROVIDER_SITE_OTHER): Payer: Self-pay

## 2015-10-14 ENCOUNTER — Encounter (INDEPENDENT_AMBULATORY_CARE_PROVIDER_SITE_OTHER): Payer: Self-pay | Admitting: Surgery

## 2015-10-14 ENCOUNTER — Ambulatory Visit (INDEPENDENT_AMBULATORY_CARE_PROVIDER_SITE_OTHER): Payer: Commercial Managed Care - HMO

## 2015-10-14 ENCOUNTER — Ambulatory Visit (INDEPENDENT_AMBULATORY_CARE_PROVIDER_SITE_OTHER): Payer: Commercial Managed Care - HMO | Admitting: Surgery

## 2015-10-14 DIAGNOSIS — K449 Diaphragmatic hernia without obstruction or gangrene: Secondary | ICD-10-CM

## 2015-10-14 DIAGNOSIS — I10 Essential (primary) hypertension: Secondary | ICD-10-CM

## 2015-10-14 DIAGNOSIS — G4733 Obstructive sleep apnea (adult) (pediatric): Secondary | ICD-10-CM

## 2015-10-14 DIAGNOSIS — E559 Vitamin D deficiency, unspecified: Secondary | ICD-10-CM

## 2015-10-14 DIAGNOSIS — M170 Bilateral primary osteoarthritis of knee: Secondary | ICD-10-CM

## 2015-10-14 DIAGNOSIS — K219 Gastro-esophageal reflux disease without esophagitis: Secondary | ICD-10-CM

## 2015-10-14 DIAGNOSIS — Z6841 Body Mass Index (BMI) 40.0 and over, adult: Secondary | ICD-10-CM

## 2015-10-14 MED ORDER — OXYCODONE HCL 5 MG/5ML PO SOLN
5.0000 mg | ORAL | Status: DC | PRN
Start: 2015-10-14 — End: 2015-12-13

## 2015-10-14 MED ORDER — ENOXAPARIN SODIUM 40 MG/0.4ML SC SOLN
40.0000 mg | Freq: Every day | SUBCUTANEOUS | Status: DC
Start: 2015-10-14 — End: 2015-12-13

## 2015-10-14 MED ORDER — ONDANSETRON 4 MG PO TBDP
4.0000 mg | ORAL_TABLET | Freq: Three times a day (TID) | ORAL | Status: AC | PRN
Start: 2015-10-14 — End: 2015-11-03

## 2015-10-14 NOTE — Progress Notes (Signed)
Refer to the history and physical

## 2015-10-14 NOTE — H&P (Signed)
Assessment:  Morbid obesity  BMI of 40   Hypertension.   Obstructive sleep apnea on CPAP.   Gastroesophageal reflux disease with esophagitis.   Hiatal hernia.   Anxiety.   Vitamin D deficiency.   Osteoarthritis bilateral knees   History of transient ischemic attacks          Plan:  This is a 64 y.o. year old morbidly obese female who has failed multiple previous attempts at conservative weight loss and has opted to proceed with laparoscopic RNY-Gastric Bypass surgery.  All questions and concerns were addressed.  The risks, benefits and alternate options were discussed with in detail.  The risks include but are not limited to bleeding, infection, sepsis, shock, wound infection, wound herniation, DVT, PE, death, as well as unforeseen conditions such as  inadequate weight loss, excessive weight loss, weight regain, anastomotic leaks, anastomotic ulcers, and anastomotic strictures.  Patient wishes to proceed with above mentioned plan and surgery at this time.        History of Present Illness:  The patient is a pleasant 64 y.o. year old morbidly obese female  who has failed multiple previous attempts at conservative weight loss. She has opted to proceed with laparoscopic RNY-Gastric Bypass as a surgical weight loss option.  All other options were discussed in detail. The patient has undergone an extensive preoperative education and medical clearance along with dietary and psychiatric counseling.      Bariatric comorbidities present: hypertension, GERD, osteoarthritis and obstructive sleep apnea on CPAP  The following portions of the patient's history were reviewed and updated as appropriate: allergies, current medications, past family history, past medical history, past social history, past surgical history and problem list.  CURRENT PROBLEM LIST:   Patient Active Problem List   Diagnosis   . Essential hypertension   . Anxiety state, unspecified   . Abnormal electrocardiogram   . Cerebral infarction   . Gastroesophageal  reflux disease   . Hypernatremia   . Paresthesia   . Slow transit constipation   . Vitamin D deficiency   . Overactive bladder   . S/P laparoscopic cholecystectomy   . Transient cerebral ischemia, unspecified type   . Palpitations   . Pituitary microadenoma   . Morbid obesity with BMI of 40.0-44.9, adult   . Dyslipidemia   . Other long term (current) drug therapy   . Headache   . History of spinal surgery   . Obstructive sleep apnea syndrome   . Hiatal hernia   . Osteoarthritis of knees, bilateral   . Pre-op evaluation     PAST MEDICAL HISTORY:   Past Medical History   Diagnosis Date   . GERD (gastroesophageal reflux disease)    . Hiatal hernia    . Genital herpes      no current outbreak (10/03/15)   . Hyperlipidemia    . Osteoarthritis of knees, bilateral    . Nausea without vomiting    . OSA on CPAP 2015     CPAP nightly x 1 yr   . Difficulty walking      amb with cane secondary to arthritis bil knees   . Morbid obesity with BMI of 40.0-44.9, adult      BMI 40.9   . TIA (transient ischemic attack) 2014     TIA 2014-no residual. Patient evaulated for possible TIA 07/12/2015  @ [facility] (dischage summary in epic)-per summary CT of head was normal and patient was d/c'd   . Hypertensive disorder  Well controlled on med. Denies any SOB in last 6 months (10/03/15)   . Chest pain 2016      Chest pain after eating x6  months--being followed by GI for GERD     PAST SURGICAL HISTORY:   Past Surgical History   Procedure Laterality Date   . Ovary surgery Right >25 yrs   . Fallopian tube surgery  >25 yrs   . Laparoscopic, cholecystectomy, cholangiogram  08/13/2014   . Appendectomy  >25 yrs   . Spine surgery  11/2013     cervical HNP x 2, Dr. Bufford Buttner   . Egd  01/2015     FAMILY HISTORY:    Family History   Problem Relation Age of Onset   . Cancer Mother 46     breast cancer     SOCIAL HISTORY:   Social History     Social History   . Marital Status: Single     Spouse Name: N/A   . Number of Children: 2   . Years of Education: N/A      Occupational History   . Child psychotherapist      Social History Main Topics   . Smoking status: Never Smoker    . Smokeless tobacco: Never Used   . Alcohol Use: No   . Drug Use: No   . Sexual Activity: No     Other Topics Concern   . Dietary Supplements / Vitamins Yes   . Anesthesia Problems No   . Blood Thinners No   . Eats Large Amounts No   . Excessive Sweets No   . Skips Meals No   . Eats Excessive Starches Yes   . Snacks Or Grazes No   . Emotional Eater Yes   . Eats Fried Food Yes   . Eats Fast Food Yes   . Diet Center No   . Hmr No   . [name] No   . La Weight Loss No   . Nutri-System No   . Opti-Fast / Medi-Fast No   . Overeaters Anonymous No   . [facility] Weight Loss Center No   . Tops No   . Weight Watchers No   . Atkins No   . Binging / Purging No   . Body For Life No   . Cabbage Soup No   . Calorie Counting No   . Fasting No   . [name] No   . Health Spa No   . Herbal Life No   . High Protein No   . Low Carb No   . Low Fat No   . [facility] Diet No   . [place] Diet No   . Margie Billet Diet No   . [place] Diet No   . Slim Fast No   . [place] No   . Sugar Busters No   . Vomiting No   . Zone Diet No   . Stationary Cycle Or Treadmill No   . Gym/Fitness Classes No   . Home Exercise/Video No   . Swimming No   . Team Sports No   . Weight Training No   . Walking Or Running No   . Hospitalization No   . Hypnosis No   . Physical Therapy No   . Psychological Therapy No   . Residential Program No   . Acutrim No   . Amphetamines No   . Anorex No   . Byetta No   . Dexatrim No   .  Didrex No   . Fastin No   . Fen - Phen No   . Ionamin / Adipex No   . Mazanor No   . Meridia No   . Obalan No   . Phendiet No   . Phentrol No   . Phenteramine Yes   . Plegine No   . Pondimin No   . Qsymia No   . Prozac No   . Redux No   . Sanorex No   . Tenuate No   . Tepanole No   . Wechless No   . Wellbutrin No   . Xenical (Orlistat, Alli) No   . Other Med No   . No Impairment Yes     Chronic Bilat Knee Pain    . Walks With Cane/Crutch Yes   . Requires A Wheelchair No   . Bedridden No   . Are You Currently Being Treated For Depression? No   . Do You Snore? Yes   . Are You Receiving Any Medical Or Psychological Services? No   . Do You Have Or Have You Been Treated For An Eating Disorder? No   . Do You Exercise Regularly? No   . Have You Or Family Member Ever Have Trouble With Anesthesia? No     Social History Narrative    (Does not refresh)  TOBACCO HISTORY:   History   Smoking status   . Never Smoker    Smokeless tobacco   . Never Used     ALCOHOL HISTORY:   History   Alcohol Use No     DRUG HISTORY:   History   Drug Use No     CURRENT HOSPITAL MEDICATIONS:   Current Outpatient Prescriptions   Medication Sig Dispense Refill   . albuterol (PROVENTIL HFA;VENTOLIN HFA) 108 (90 BASE) MCG/ACT inhaler Inhale into the lungs every 4 (four) hours as needed.        Marland Kitchen aspirin EC 81 MG EC tablet Take 81 mg by mouth daily.     Marland Kitchen atenolol (TENORMIN) 50 MG tablet TAKE 1 TABLET(50 MG) BY MOUTH DAILY (Patient taking differently: TAKE 1 TABLET(50 MG) BY MOUTH every morning) 90 tablet 0   . cholecalciferol (VITAMIN D3) 1000 UNITS tablet Take 1,000 Units by Mouth Once a Day.     . desonide (DESOWEN) 0.05 % cream Apply topically 2 (two) times daily as needed. 60 g 2   . DILT-XR 240 MG 24 hr capsule TAKE 1 CAPSULE(240 MG) BY MOUTH DAILY (Patient taking differently: TAKE 1 CAPSULE(240 MG) BY MOUTH every morning) 90 capsule 0   . Fluocinolone Acetonide 0.01 % Oil Place 2 drops in ear(s) 3 (three) times daily. 1 Bottle 0   . fluticasone (FLONASE) 50 MCG/ACT nasal spray 2 sprays by Nasal route daily. (Patient taking differently: 2 sprays by Nasal route as needed.   ) 16 g 2   . hydrochlorothiazide (MICROZIDE) 12.5 MG capsule Take 1 capsule (12.5 mg total) by mouth every morning. 90 capsule 1   . potassium chloride (K-TAB,KLOR-CON) 10 MEQ tablet Take 1 tablet (10 mEq total) by mouth daily. 90 tablet 1   . valACYclovir HCL (VALTREX) 500 MG tablet  Take 1 tablet (500 mg total) by mouth daily. (Patient taking differently: Take 500 mg by mouth as needed.   ) 90 tablet 1   . enoxaparin (LOVENOX) 40 MG/0.4ML Solution Inject 0.4 mLs (40 mg total) into the skin daily. 14 Syringe 0   . ondansetron (ZOFRAN-ODT) 4 MG disintegrating tablet Take  1 tablet (4 mg total) by mouth every 8 (eight) hours as needed for Nausea. 20 tablet 0   . oxyCODONE (ROXICODONE) 5 MG/5ML solution Take 5-10 mLs (5-10 mg total) by mouth every 4 (four) hours as needed for Pain. 250 mL 0   . pantoprazole (PROTONIX) 40 MG tablet Take 1 tablet (40 mg total) by mouth daily. 30 tablet 2     No current facility-administered medications for this visit.     CURRENT OUTPATIENT MEDICATIONS:   Outpatient Prescriptions Marked as Taking for the 10/14/15 encounter (Clinical Support) with Edana Aguado R, DO   Medication Sig Dispense Refill   . albuterol (PROVENTIL HFA;VENTOLIN HFA) 108 (90 BASE) MCG/ACT inhaler Inhale into the lungs every 4 (four) hours as needed.        Marland Kitchen aspirin EC 81 MG EC tablet Take 81 mg by mouth daily.     Marland Kitchen atenolol (TENORMIN) 50 MG tablet TAKE 1 TABLET(50 MG) BY MOUTH DAILY (Patient taking differently: TAKE 1 TABLET(50 MG) BY MOUTH every morning) 90 tablet 0   . cholecalciferol (VITAMIN D3) 1000 UNITS tablet Take 1,000 Units by Mouth Once a Day.     . desonide (DESOWEN) 0.05 % cream Apply topically 2 (two) times daily as needed. 60 g 2   . DILT-XR 240 MG 24 hr capsule TAKE 1 CAPSULE(240 MG) BY MOUTH DAILY (Patient taking differently: TAKE 1 CAPSULE(240 MG) BY MOUTH every morning) 90 capsule 0   . Fluocinolone Acetonide 0.01 % Oil Place 2 drops in ear(s) 3 (three) times daily. 1 Bottle 0   . fluticasone (FLONASE) 50 MCG/ACT nasal spray 2 sprays by Nasal route daily. (Patient taking differently: 2 sprays by Nasal route as needed.   ) 16 g 2   . hydrochlorothiazide (MICROZIDE) 12.5 MG capsule Take 1 capsule (12.5 mg total) by mouth every morning. 90 capsule 1   . potassium chloride  (K-TAB,KLOR-CON) 10 MEQ tablet Take 1 tablet (10 mEq total) by mouth daily. 90 tablet 1   . valACYclovir HCL (VALTREX) 500 MG tablet Take 1 tablet (500 mg total) by mouth daily. (Patient taking differently: Take 500 mg by mouth as needed.   ) 90 tablet 1     ALLERGIES:   Allergies   Allergen Reactions   . Acyclovir Nausea And Vomiting   . Amoxicillin-Pot Clavulanate      Other reaction(s): gi distress   . Aspirin Nausea And Vomiting     Other reaction(s): gi distress  Upsets stomach  Able to tolerate enteric coated aspirin   . Atorvastatin      Palpitation   . Lovastatin Nausea And Vomiting   . Metronidazole Nausea And Vomiting   . Moxifloxacin Nausea And Vomiting     GI symptoms   . Other Nausea And Vomiting   . Rosuvastatin      Palpitation, chest pain, abd pain    . Statins Nausea And Vomiting     Other reaction(s): gi distress   . Sulfa Antibiotics Nausea And Vomiting     Other reaction(s): gi distress        Review of Systems  Constitutional: negative for fevers, night sweats  Respiratory: negative for SOB, cough  Cardiovascular: negative for chest pain, palpitations  Gastrointestinal: negative for nausea or vomiting  Genitourinary:negative for hematuria, dysuria  Musculoskeletal:negative for bone pain, myalgias and stiff joints  Neurological: negative for dizziness, gait problems, headaches and memory problems  Behavioral/Psych: negative for fatigue, loss of interest in favorite activities, separation anxiety and  sleep disturbance  Endocrine: negative for temperature intolerance    Objective:    BP 142/84 mmHg  Pulse 69  Temp(Src) 97.8 F (36.6 C)  Ht 5\' 1"   Wt 214 lb  BMI 40.46 kg/m2  Body mass index is 40.46 kg/(m^2).  Weight: 214 lb       General Appearance:    Alert, cooperative, no distress, obese   Head:    Normocephalic, without obvious abnormality, atraumatic   Eyes:    No scleral icterus            Throat:   Lips, mucosa, and tongue normal; teeth and gums normal   Neck:   Supple, symmetrical,  trachea midline, no adenopathy;        thyroid:  No enlargement/tenderness/nodules; no carotid    bruit or JVD   Back:     Symmetric, no curvature, ROM normal, no CVA tenderness   Lungs:     Clear to auscultation bilaterally, respirations unlabored   Chest wall:    No tenderness or deformity   Heart:    Regular rate and rhythm, S1 and S2 normal, no murmur, rub   or gallop   Abdomen:     Soft, non-tender, bowel sounds active all four quadrants,     no masses, no organomegaly   Extremities:   Extremities normal, atraumatic, no cyanosis or edema   Pulses:   2+ and symmetric all extremities   Skin:   Skin color, texture, turgor normal, no rashes or lesions   Lymph nodes:   Cervical, supraclavicular, and axillary nodes normal   Neurologic:   Alert and oriented x 3.  Able to move all extremities.       Data Review:     Office Visit on 09/25/2015   Component Date Value Ref Range Status   . Vitamin D, 25-OH, Total 09/25/2015 31  30 - 100 ng/mL Final   . Vitamin D, 25-OH, D3 09/25/2015 14   Final   . 25-Hydroxy D2 09/25/2015 17   Final    Comment: 25-OHD3 indicates both endogenous production and  supplementation. 25-OHD2 is an indicator of  exogenous sources such as diet or supplementation.  Therapy is based on measurement of Total 25-OHD,  with levels <20 ng/mL indicative of Vitamin D  deficiency while levels between 20 ng/mL and 30  ng/mL suggest insufficiency. Optimal levels are  > or = 30 ng/mL.  For more information on this test, go to  http://education.[facility].com/faq/FAQ163     . Vitamin A (Retinol) 09/25/2015 44  38 - 98 Final    Comment: Vitamin supplementation within 24 hours prior to  blood draw may affect the accuracy of the results.  This test was developed and its analytical performance  characteristics have been determined by [facility] [place], [place]. It has  not been cleared or approved by the U.S. Food and Drug  Administration. This assay has been validated pursuant  to the  CLIA regulations and is used for clinical  purposes.  Test Performed by Clerance Lav,  [facility],  [address] [place], [place] [address]  Si Raider, M.D., Ph.D., Director of Laboratories  [phone], CLIA [id number]     . Vitamin B1 (Thiamine) 09/25/2015 10  8 - 30 nmol/L Final    Comment: Vitamin supplementation within 24 hours prior to  blood draw may affect the accuracy of the results.  This test was developed and its analytical performance  characteristics have been determined  by [facility], [place], [place]. It has  not been cleared or approved by the U.S. Food and Drug  Administration. This assay has been validated pursuant  to the CLIA regulations and is used for clinical  purposes.  Test Performed by Clerance Lav,  [facility],  [address], [place], [place] [address]  Si Raider, M.D., Ph.D., Director of Laboratories  [phone], CLIA [id number]     . Cholesterol 09/25/2015 255* 0 - 199 mg/dL Final   . Triglycerides 09/25/2015 63  34 - 149 mg/dL Final   . HDL 13/06/6577 64  40 - 9999 mg/dL Final    Comment: HDL:       Less than 40 mg/dL - Major risk heart disease       Greater than or equal to 60 mg/dL - Negative risk factor       for heart disease     . LDL Calculated 09/25/2015 469* 0 - 99 mg/dL Final   . VLDL Cholesterol Cal 09/25/2015 13  10 - 40 mg/dL Final   . CHOL/HDL Ratio 09/25/2015 4.0  See Below Final    Comment: Chol/HDL Ratio:  Classification                   Female     Female  Very Low (1/2 Average Risk)      <3.4     <3.3  Low Risk                         4.0      3.8  Average Risk                     5.0      4.5  Moderate Risk (2X Average risk)  9.5      7.0  High Risk (3X Average Risk)      >23.0    >11.0     . Folate 09/25/2015 13.1  See below ng/mL Final    Comment: Deficient    : <3.5 ng/mL  Intermediate : 3.5 - 5.4 ng/mL  Normal       : Greater Than 5.4 ng/mL     . Vitamin B-12  09/25/2015 475  211 - 911 pg/mL Final   . WBC 09/25/2015 4.23  3.50 - 10.80 x10 3/uL Final   . Hgb 09/25/2015 13.3  12.0 - 16.0 g/dL Final   . Hematocrit 62/95/2841 41.0  37.0 - 47.0 % Final   . Platelets 09/25/2015 276  140 - 400 x10 3/uL Final   . RBC 09/25/2015 4.88  4.20 - 5.40 x10 6/uL Final   . MCV 09/25/2015 84.0  80.0 - 100.0 fL Final   . MCH 09/25/2015 27.3* 28.0 - 32.0 pg Final   . MCHC 09/25/2015 32.4  32.0 - 36.0 g/dL Final   . RDW 32/44/0102 16* 12 - 15 % Final   . MPV 09/25/2015 10.2  9.4 - 12.3 fL Final   . Nucleated RBC 09/25/2015 0  0 - 1 /100 WBC Final   . Glucose 09/25/2015 86  70 - 100 mg/dL Final    Comment: ADA guidelines for diabetes mellitus:  Fasting:  Equal to or greater than 126 mg/dL  Random:   Equal to or greater than 200 mg/dL     . BUN 09/25/2015 11.0  7.0 - 19.0 mg/dL Final   . Creatinine 72/53/6644 0.8  0.4 - 1.5  mg/dL Final   . Sodium 16/08/9603 142  135 - 146 mEq/L Final   . Potassium 09/25/2015 3.7  3.5 - 5.3 mEq/L Final   . Chloride 09/25/2015 106  100 - 111 mEq/L Final   . CO2 09/25/2015 28  21 - 30 mEq/L Final   . Calcium 09/25/2015 9.8  8.5 - 10.5 mg/dL Final   . Protein, Total 09/25/2015 6.9  6.0 - 8.3 g/dL Final   . Albumin 54/07/8118 3.8  3.5 - 5.0 g/dL Final   . AST (SGOT) 14/78/2956 17  5 - 34 U/L Final   . ALT 09/25/2015 12  0 - 55 U/L Final   . Alkaline Phosphatase 09/25/2015 133* 37 - 106 U/L Final   . Bilirubin, Total 09/25/2015 0.5  0.1 - 1.2 mg/dL Final   . Globulin 21/30/8657 3.1  2.0 - 3.7 g/dL Final   . Albumin/Globulin Ratio 09/25/2015 1.2  0.9 - 2.2 Final   . Hemoglobin A1C 09/25/2015 5.6  4.6 - 5.9 % Final    Comment: HBA1C: Hemoglobin A1c values of 5.7-6.4% indicate an increased  risk for developing diabetes mellitus. Hemoglobin A1c values  greater than or equal to 6.5% are diagnostic of diabetes  mellitus.     . Average Estimated Glucose 09/25/2015 114.0   Final   . Copper 09/25/2015 136  70 - 175 Final    Comment: Test Performed by Clerance Lav,  [facility],  [address] [place], [place] [address]  Si Raider, M.D., Ph.D., Director of Laboratories  [phone], CLIA [id number]     . PTH Intact 09/25/2015 54.0  9.0 - 72.0 pg/mL Final   . Iron 09/25/2015 102  40 - 145 ug/dL Final   . UIBC 64/40/3474 286  126 - 382 ug/dL Final   . TIBC 25/95/6387 388  265 - 497 ug/dL Final   . Iron Saturation 09/25/2015 26  15 - 50 % Final   . Hemolysis Index 09/25/2015 1  0 - 18 Final   . Thyroid Stimulating Hormone 09/25/2015 0.88  0.35 - 4.94 uIU/mL Final   . EGFR 09/25/2015 >60.0   Final    Comment: Disease State Reference Ranges:    Chronic Kidney Disease; < 60 ml/min/1.73 sq.m    Kidney Failure; < 15 ml/min/1.73 sq.m    [Calculated using IDMS-Traceable MDRD equation (based on    gender, age and black vs. non-black race) recommended by    [employer] Kidney Disease Education Program. No data    available for non-white, non-black race.]  GFR estimates are unreliable in patients with:    Rapidly changing kidney function or recent dialysis,    extreme age, body size or body composition(obesity,    severe malnutrition). Abnormal muscle mass (limb    amputation, muscle wasting). In these patients,    alternative determinations of GFR should be obtained.     Appointment on 09/25/2015   Component Date Value Ref Range Status   . PTT 09/25/2015 29  23 - 37 sec Final    Comment: In vivo therapeutic range of heparin (0.3 - 0.7 IU/mL)  correlate with the following APTT times: 64 - 102 seconds.     . APTT Anticoag. Given w/i 48 hrs. 09/25/2015 Unknown   Final     .  Radiology Results for the past 90 days.   @[email or web address] @      Mitzie Marlar R. Mava Suares, DO, FACS, FASMBS, FACOS

## 2015-10-15 ENCOUNTER — Ambulatory Visit (INDEPENDENT_AMBULATORY_CARE_PROVIDER_SITE_OTHER): Payer: Commercial Managed Care - HMO

## 2015-10-15 VITALS — BP 137/85 | HR 67 | Wt 213.0 lb

## 2015-10-15 DIAGNOSIS — Z713 Dietary counseling and surveillance: Secondary | ICD-10-CM

## 2015-10-15 DIAGNOSIS — Z6841 Body Mass Index (BMI) 40.0 and over, adult: Secondary | ICD-10-CM

## 2015-10-15 DIAGNOSIS — Z01818 Encounter for other preprocedural examination: Secondary | ICD-10-CM

## 2015-10-15 DIAGNOSIS — Z7189 Other specified counseling: Secondary | ICD-10-CM

## 2015-10-15 DIAGNOSIS — E559 Vitamin D deficiency, unspecified: Secondary | ICD-10-CM

## 2015-10-15 NOTE — Progress Notes (Signed)
S:  Pt is preparing for bariatric gastric bypass surgery.  Pt presents for pre-operative review appointment and has questions about post-operative diet stages and vitamin/mineral requirements.  Pt states no hx of nutritional deficiencies.  Pt reports currently not taking any prescribed supplements at this time.  Pt is on day 8 of pre-op liquid diet.  Pt reports drinking 4 shakes/ 1 bar/ 1 soup/ 1 banana  hast started liquid diet at this time.  Pt c/o lightheadedness.      O:    Today's Wt:  213 lb 0.6 oz     Previous Wt:    Wt Readings from Last 10 Encounters:   10/15/15 213 lb 0.6 oz   10/14/15 214 lb   10/11/15 212 lb   09/25/15 216 lb   09/20/15 218 lb 1.3 oz   10/03/15 216 lb   07/19/15 214 lb   06/24/15 214 lb 0.6 oz   06/13/15 218 lb   05/15/15 213 lb     Pertinent lab values: 09/25/15 lab work noted.    A:  Per recent lab work, pt has no apparent vitamin or mineral deficiencies.  Answered pt question.  Nutrition Intervention included: encouraged pt to add electrolyte to current liquid recommendations of 64 ounces fluid/day.  Reviewed pre-op liquid diet with pt.  Reviewed vitamin/mineral prescription and post-op stage one diet.  Provided pt with handout which outlines sample hydration schedule and protein supplement needs as well as vitamin/mineral prescription.   and pt verbalized understanding and desired compliance with post-operative diet.    P:  1.  Pt to start on post operative 2 week liquid diet upon discharge from hospital and continue until directed by RD/physician at first post-operative in office appointment.  2.  Pt to start the following chewable or liquid vitamins/minerals upon hospital discharge (purchased most today):   A.  Complete Adult Multivitamin and Mineral:  1 tablet BID   B.  500 mg Calcium Citrate, 1 tablet TID   C.  1,000 mcg Vitamin B12 sublingually, q day.   D.  Vitamin B 50 Complex, 1 tablet/serving q day.   E.  Iron, 29 mg q day.     3.  Pt advised to call with any questions -  contact information given.  F/u in 10-14 post operatively or PRN.    Spent a total of 15 minutes educating pt in a individual one-on-one setting.

## 2015-10-16 ENCOUNTER — Encounter (INDEPENDENT_AMBULATORY_CARE_PROVIDER_SITE_OTHER): Payer: Self-pay | Admitting: Surgery

## 2015-10-21 NOTE — Anesthesia Preprocedure Evaluation (Addendum)
Anesthesia Evaluation    AIRWAY    Mallampati: II    TM distance: >3 FB  Neck ROM: full  Mouth Opening:full   CARDIOVASCULAR    regular and normal       DENTAL    no notable dental hx     PULMONARY    clear to auscultation     OTHER FINDINGS              PSS Anesthesia Comments: Saw PCP (Dr Daivd Council) and cards (Dr. Katrinka Blazing) for clearance.  Labs okay.        Anesthesia Plan    ASA 3     general                                 informed consent obtained    Plan discussed with CRNA.

## 2015-10-22 ENCOUNTER — Inpatient Hospital Stay: Payer: Commercial Managed Care - HMO

## 2015-10-22 ENCOUNTER — Inpatient Hospital Stay
Admission: RE | Admit: 2015-10-22 | Discharge: 2015-10-24 | DRG: 621 | Disposition: A | Discharge status: Home / Self Care | Payer: Commercial Managed Care - HMO | Source: Ambulatory Visit | Attending: Surgery | Admitting: Surgery

## 2015-10-22 ENCOUNTER — Inpatient Hospital Stay: Payer: Commercial Managed Care - HMO | Admitting: Surgery

## 2015-10-22 ENCOUNTER — Encounter
Admission: RE | Disposition: A | Discharge status: Home / Self Care | Payer: Self-pay | Source: Ambulatory Visit | Attending: Surgery

## 2015-10-22 DIAGNOSIS — K210 Gastro-esophageal reflux disease with esophagitis: Secondary | ICD-10-CM | POA: Diagnosis present

## 2015-10-22 DIAGNOSIS — Z7982 Long term (current) use of aspirin: Secondary | ICD-10-CM

## 2015-10-22 DIAGNOSIS — Z6841 Body Mass Index (BMI) 40.0 and over, adult: Secondary | ICD-10-CM

## 2015-10-22 DIAGNOSIS — Z9981 Dependence on supplemental oxygen: Secondary | ICD-10-CM

## 2015-10-22 DIAGNOSIS — I10 Essential (primary) hypertension: Secondary | ICD-10-CM | POA: Diagnosis present

## 2015-10-22 DIAGNOSIS — G8918 Other acute postprocedural pain: Secondary | ICD-10-CM | POA: Diagnosis not present

## 2015-10-22 DIAGNOSIS — E785 Hyperlipidemia, unspecified: Secondary | ICD-10-CM | POA: Diagnosis present

## 2015-10-22 DIAGNOSIS — Z9884 Bariatric surgery status: Secondary | ICD-10-CM

## 2015-10-22 DIAGNOSIS — K449 Diaphragmatic hernia without obstruction or gangrene: Secondary | ICD-10-CM | POA: Diagnosis present

## 2015-10-22 DIAGNOSIS — E559 Vitamin D deficiency, unspecified: Secondary | ICD-10-CM | POA: Diagnosis present

## 2015-10-22 DIAGNOSIS — R9431 Abnormal electrocardiogram [ECG] [EKG]: Secondary | ICD-10-CM

## 2015-10-22 DIAGNOSIS — M170 Bilateral primary osteoarthritis of knee: Secondary | ICD-10-CM | POA: Diagnosis present

## 2015-10-22 DIAGNOSIS — K219 Gastro-esophageal reflux disease without esophagitis: Secondary | ICD-10-CM | POA: Diagnosis present

## 2015-10-22 DIAGNOSIS — G4733 Obstructive sleep apnea (adult) (pediatric): Secondary | ICD-10-CM | POA: Diagnosis present

## 2015-10-22 DIAGNOSIS — Z8673 Personal history of transient ischemic attack (TIA), and cerebral infarction without residual deficits: Secondary | ICD-10-CM

## 2015-10-22 HISTORY — PX: LAPAROSCOPIC, HERNIORRHAPHY, HIATAL: SHX4510

## 2015-10-22 HISTORY — PX: INSERTION, PAIN PUMP (MEDICAL): SHX4385

## 2015-10-22 HISTORY — PX: LAPAROSCOPIC, GASTRIC BYPASS: SHX4501

## 2015-10-22 LAB — TYPE AND SCREEN
AB Screen Gel: NEGATIVE
ABO Rh: A NEG

## 2015-10-22 SURGERY — LAPAROSCOPIC, GASTRIC BYPASS
Anesthesia: Anesthesia General | Site: Abdomen | Wound class: Clean Contaminated

## 2015-10-22 MED ORDER — LACTATED RINGERS IV SOLN
INTRAVENOUS | Status: DC
Start: 2015-10-22 — End: 2015-10-22

## 2015-10-22 MED ORDER — ONDANSETRON HCL 4 MG/2ML IJ SOLN
4.0000 mg | Freq: Four times a day (QID) | INTRAMUSCULAR | Status: DC | PRN
Start: 2015-10-22 — End: 2015-10-24

## 2015-10-22 MED ORDER — SIMETHICONE 80 MG PO CHEW
80.0000 mg | CHEWABLE_TABLET | Freq: Four times a day (QID) | ORAL | Status: DC | PRN
Start: 2015-10-23 — End: 2015-10-24

## 2015-10-22 MED ORDER — ROCURONIUM BROMIDE 50 MG/5ML IV SOLN
INTRAVENOUS | Status: DC | PRN
Start: 2015-10-22 — End: 2015-10-22
  Administered 2015-10-22: 45 mg via INTRAVENOUS
  Administered 2015-10-22: 5 mg via INTRAVENOUS
  Administered 2015-10-22: 10 mg via INTRAVENOUS

## 2015-10-22 MED ORDER — ONDANSETRON HCL 4 MG/2ML IJ SOLN
4.0000 mg | INTRAMUSCULAR | Status: DC | PRN
Start: 2015-10-22 — End: 2015-10-22

## 2015-10-22 MED ORDER — KCL IN DEXTROSE-NACL 20-5-0.45 MEQ/L-%-% IV SOLN
INTRAVENOUS | Status: DC
Start: 2015-10-22 — End: 2015-10-23

## 2015-10-22 MED ORDER — HYDROMORPHONE HCL 1 MG/ML IJ SOLN
0.5000 mg | INTRAMUSCULAR | Status: DC | PRN
Start: 2015-10-22 — End: 2015-10-22

## 2015-10-22 MED ORDER — BISACODYL 10 MG RE SUPP
10.0000 mg | Freq: Every day | RECTAL | Status: DC | PRN
Start: 2015-10-23 — End: 2015-10-24

## 2015-10-22 MED ORDER — CLINDAMYCIN PHOSPHATE IN D5W 900 MG/50ML IV SOLN
900.0000 mg | Freq: Three times a day (TID) | INTRAVENOUS | Status: AC
Start: 2015-10-22 — End: 2015-10-22
  Administered 2015-10-22 (×2): 900 mg via INTRAVENOUS
  Filled 2015-10-22 (×2): qty 50

## 2015-10-22 MED ORDER — HYDROMORPHONE HCL 1 MG/ML IJ SOLN
1.0000 mg | INTRAMUSCULAR | Status: DC | PRN
Start: 2015-10-22 — End: 2015-10-24
  Administered 2015-10-22 – 2015-10-23 (×11): 1 mg via INTRAVENOUS
  Filled 2015-10-22 (×11): qty 1

## 2015-10-22 MED ORDER — BUPIVACAINE-EPINEPHRINE (PF) 0.5% -1:200000 IJ SOLN
INTRAMUSCULAR | Status: AC
Start: 2015-10-22 — End: ?
  Filled 2015-10-22: qty 30

## 2015-10-22 MED ORDER — ACETAMINOPHEN 10 MG/ML IV SOLN
1000.0000 mg | Freq: Four times a day (QID) | INTRAVENOUS | Status: AC
Start: 2015-10-22 — End: 2015-10-23
  Administered 2015-10-22 – 2015-10-23 (×3): 1000 mg via INTRAVENOUS
  Filled 2015-10-22 (×3): qty 100

## 2015-10-22 MED ORDER — MEPERIDINE HCL 25 MG/ML IJ SOLN
12.5000 mg | INTRAMUSCULAR | Status: DC | PRN
Start: 2015-10-22 — End: 2015-10-22

## 2015-10-22 MED ORDER — HYDRALAZINE HCL 20 MG/ML IJ SOLN
10.0000 mg | Freq: Four times a day (QID) | INTRAMUSCULAR | Status: DC | PRN
Start: 2015-10-22 — End: 2015-10-24

## 2015-10-22 MED ORDER — SUCCINYLCHOLINE CHLORIDE 20 MG/ML IJ SOLN
INTRAMUSCULAR | Status: AC
Start: 2015-10-22 — End: ?
  Filled 2015-10-22: qty 10

## 2015-10-22 MED ORDER — DEXMEDETOMIDINE HCL IN NACL 200 MCG/50ML IV SOLN
INTRAVENOUS | Status: AC
Start: 2015-10-22 — End: ?
  Filled 2015-10-22: qty 50

## 2015-10-22 MED ORDER — ENOXAPARIN SODIUM 40 MG/0.4ML SC SOLN
40.0000 mg | Freq: Once | SUBCUTANEOUS | Status: AC
Start: 2015-10-22 — End: 2015-10-22
  Administered 2015-10-22: 40 mg via SUBCUTANEOUS

## 2015-10-22 MED ORDER — MIDAZOLAM HCL 2 MG/2ML IJ SOLN
INTRAMUSCULAR | Status: DC | PRN
Start: 2015-10-22 — End: 2015-10-22
  Administered 2015-10-22: 1 mg via INTRAVENOUS

## 2015-10-22 MED ORDER — ALBUTEROL SULFATE 1.25 MG/3ML IN NEBU
1.2500 mg | INHALATION_SOLUTION | RESPIRATORY_TRACT | Status: DC | PRN
Start: 2015-10-22 — End: 2015-10-24

## 2015-10-22 MED ORDER — PANTOPRAZOLE SODIUM 40 MG IV SOLR
40.0000 mg | INTRAVENOUS | Status: AC
Start: 2015-10-22 — End: 2015-10-22
  Administered 2015-10-22: 40 mg via INTRAVENOUS

## 2015-10-22 MED ORDER — ATENOLOL 50 MG PO TABS
50.0000 mg | ORAL_TABLET | Freq: Every day | ORAL | Status: DC
Start: 2015-10-23 — End: 2015-10-24
  Administered 2015-10-23 – 2015-10-24 (×2): 50 mg via ORAL
  Filled 2015-10-22 (×2): qty 1

## 2015-10-22 MED ORDER — FENTANYL CITRATE (PF) 50 MCG/ML IJ SOLN (WRAP)
INTRAMUSCULAR | Status: AC
Start: 2015-10-22 — End: 2015-10-22
  Administered 2015-10-22: 25 ug via INTRAVENOUS
  Filled 2015-10-22: qty 2

## 2015-10-22 MED ORDER — ACETAMINOPHEN 10 MG/ML IV SOLN
INTRAVENOUS | Status: AC
Start: 2015-10-22 — End: ?
  Filled 2015-10-22: qty 100

## 2015-10-22 MED ORDER — PANTOPRAZOLE SODIUM 40 MG IV SOLR
INTRAVENOUS | Status: AC
Start: 2015-10-22 — End: ?
  Filled 2015-10-22: qty 40

## 2015-10-22 MED ORDER — CLINDAMYCIN PHOSPHATE IN D5W 900 MG/50ML IV SOLN
INTRAVENOUS | Status: AC
Start: 2015-10-22 — End: ?
  Filled 2015-10-22: qty 50

## 2015-10-22 MED ORDER — ROCURONIUM BROMIDE 50 MG/5ML IV SOLN
INTRAVENOUS | Status: AC
Start: 2015-10-22 — End: ?
  Filled 2015-10-22: qty 5

## 2015-10-22 MED ORDER — FAMOTIDINE 20 MG/2ML IV SOLN
INTRAVENOUS | Status: AC
Start: 2015-10-22 — End: ?
  Filled 2015-10-22: qty 2

## 2015-10-22 MED ORDER — DIPHENHYDRAMINE HCL 50 MG/ML IJ SOLN
12.5000 mg | Freq: Four times a day (QID) | INTRAMUSCULAR | Status: DC | PRN
Start: 2015-10-22 — End: 2015-10-24

## 2015-10-22 MED ORDER — HYOSCYAMINE SULFATE 0.125 MG SL SUBL
0.1250 mg | SUBLINGUAL_TABLET | SUBLINGUAL | Status: DC | PRN
Start: 2015-10-22 — End: 2015-10-24

## 2015-10-22 MED ORDER — FAMOTIDINE 10 MG/ML IV SOLN (WRAP)
INTRAVENOUS | Status: DC | PRN
Start: 2015-10-22 — End: 2015-10-22
  Administered 2015-10-22: 20 mg via INTRAVENOUS

## 2015-10-22 MED ORDER — DILTIAZEM HCL 60 MG PO TABS
60.0000 mg | ORAL_TABLET | Freq: Four times a day (QID) | ORAL | Status: DC
Start: 2015-10-23 — End: 2015-10-24
  Administered 2015-10-23 – 2015-10-24 (×4): 60 mg via ORAL
  Filled 2015-10-22 (×8): qty 1

## 2015-10-22 MED ORDER — SODIUM CHLORIDE 0.9 % IR SOLN
Status: DC | PRN
Start: 2015-10-22 — End: 2015-10-22
  Administered 2015-10-22: 1000 mL

## 2015-10-22 MED ORDER — FENTANYL CITRATE (PF) 50 MCG/ML IJ SOLN (WRAP)
25.0000 ug | INTRAMUSCULAR | Status: AC | PRN
Start: 2015-10-22 — End: 2015-10-22
  Administered 2015-10-22 (×3): 25 ug via INTRAVENOUS

## 2015-10-22 MED ORDER — FAMOTIDINE 10 MG/ML IV SOLN (WRAP)
20.0000 mg | Freq: Two times a day (BID) | INTRAVENOUS | Status: DC
Start: 2015-10-22 — End: 2015-10-24
  Administered 2015-10-22 – 2015-10-23 (×2): 20 mg via INTRAVENOUS
  Filled 2015-10-22 (×3): qty 2

## 2015-10-22 MED ORDER — SODIUM CHLORIDE 0.9 % IV SOLN
400.0000 ug | INTRAVENOUS | Status: DC | PRN
Start: 2015-10-22 — End: 2015-10-22
  Administered 2015-10-22: .7 ug/kg/h via INTRAVENOUS

## 2015-10-22 MED ORDER — MIDAZOLAM HCL 2 MG/2ML IJ SOLN
INTRAMUSCULAR | Status: AC
Start: 2015-10-22 — End: ?
  Filled 2015-10-22: qty 2

## 2015-10-22 MED ORDER — CLINDAMYCIN PHOSPHATE IN D5W 900 MG/50ML IV SOLN
900.0000 mg | INTRAVENOUS | Status: AC
Start: 2015-10-22 — End: 2015-10-22
  Administered 2015-10-22: 900 mg via INTRAVENOUS

## 2015-10-22 MED ORDER — PROMETHAZINE HCL 25 MG/ML IJ SOLN
6.2500 mg | Freq: Four times a day (QID) | INTRAMUSCULAR | Status: DC | PRN
Start: 2015-10-22 — End: 2015-10-24

## 2015-10-22 MED ORDER — ENOXAPARIN SODIUM 40 MG/0.4ML SC SOLN
SUBCUTANEOUS | Status: AC
Start: 2015-10-22 — End: ?
  Filled 2015-10-22: qty 0.4

## 2015-10-22 MED ORDER — NON FORMULARY
1000.0000 mg | Freq: Four times a day (QID) | Status: DC
Start: 2015-10-22 — End: 2015-10-22

## 2015-10-22 MED ORDER — PROMETHAZINE HCL 25 MG/ML IJ SOLN
6.2500 mg | INTRAMUSCULAR | Status: DC | PRN
Start: 2015-10-22 — End: 2015-10-22

## 2015-10-22 MED ORDER — NEOSTIGMINE METHYLSULFATE 1 MG/ML IJ SOLN
INTRAMUSCULAR | Status: AC
Start: 2015-10-22 — End: ?
  Filled 2015-10-22: qty 10

## 2015-10-22 MED ORDER — ACETAMINOPHEN 10 MG/ML IV SOLN
1000.0000 mg | INTRAVENOUS | Status: AC
Start: 2015-10-22 — End: 2015-10-22
  Administered 2015-10-22: 1000 mg via INTRAVENOUS
  Filled 2015-10-22: qty 100

## 2015-10-22 MED ORDER — LACTATED RINGERS IV SOLN
INTRAVENOUS | Status: DC | PRN
Start: 2015-10-22 — End: 2015-10-22
  Administered 2015-10-22: 1000 mL

## 2015-10-22 MED ORDER — LIDOCAINE HCL 2 % IJ SOLN
INTRAMUSCULAR | Status: AC
Start: 2015-10-22 — End: ?
  Filled 2015-10-22: qty 20

## 2015-10-22 MED ORDER — ACETAMINOPHEN 160 MG/5ML PO SOLN
650.0000 mg | ORAL | Status: DC | PRN
Start: 2015-10-23 — End: 2015-10-24

## 2015-10-22 MED ORDER — PROPOFOL 10 MG/ML IV EMUL (WRAP)
INTRAVENOUS | Status: AC
Start: 2015-10-22 — End: ?
  Filled 2015-10-22: qty 20

## 2015-10-22 MED ORDER — BUPIVACAINE HCL 0.5 % IJ SOLN
INTRAMUSCULAR | Status: DC | PRN
Start: 2015-10-22 — End: 2015-10-22

## 2015-10-22 MED ORDER — ONDANSETRON HCL 4 MG/2ML IJ SOLN
INTRAMUSCULAR | Status: AC
Start: 2015-10-22 — End: ?
  Filled 2015-10-22: qty 2

## 2015-10-22 MED ORDER — SCOPOLAMINE 1 MG/3DAYS TD PT72
1.0000 | MEDICATED_PATCH | TRANSDERMAL | Status: DC
Start: 2015-10-22 — End: 2015-10-24
  Administered 2015-10-22: 1 via TRANSDERMAL
  Filled 2015-10-22: qty 1

## 2015-10-22 MED ORDER — BUPIVACAINE-EPINEPHRINE (PF) 0.5% -1:200000 IJ SOLN
INTRAMUSCULAR | Status: DC | PRN
Start: 2015-10-22 — End: 2015-10-22
  Administered 2015-10-22: 2 mL
  Administered 2015-10-22: 28 mL

## 2015-10-22 MED ORDER — PROPOFOL 10 MG/ML IV EMUL (WRAP)
INTRAVENOUS | Status: DC | PRN
Start: 2015-10-22 — End: 2015-10-22
  Administered 2015-10-22: 200 mg via INTRAVENOUS

## 2015-10-22 MED ORDER — GLYCOPYRROLATE 1 MG/5ML IJ SOLN
INTRAMUSCULAR | Status: AC
Start: 2015-10-22 — End: ?
  Filled 2015-10-22: qty 5

## 2015-10-22 MED ORDER — FENTANYL CITRATE (PF) 50 MCG/ML IJ SOLN (WRAP)
INTRAMUSCULAR | Status: AC
Start: 2015-10-22 — End: ?
  Filled 2015-10-22: qty 4

## 2015-10-22 MED ORDER — FENTANYL CITRATE (PF) 50 MCG/ML IJ SOLN (WRAP)
INTRAMUSCULAR | Status: DC | PRN
Start: 2015-10-22 — End: 2015-10-22
  Administered 2015-10-22: 100 ug via INTRAVENOUS
  Administered 2015-10-22: 50 ug via INTRAVENOUS

## 2015-10-22 MED ORDER — LACTATED RINGERS IV BOLUS
100.0000 mL | Freq: Once | INTRAVENOUS | Status: DC
Start: 2015-10-22 — End: 2015-10-22

## 2015-10-22 MED ORDER — ENOXAPARIN SODIUM 40 MG/0.4ML SC SOLN
40.0000 mg | Freq: Every morning | SUBCUTANEOUS | Status: DC
Start: 2015-10-23 — End: 2015-10-24
  Administered 2015-10-23 – 2015-10-24 (×2): 40 mg via SUBCUTANEOUS
  Filled 2015-10-22 (×2): qty 0.4

## 2015-10-22 SURGICAL SUPPLY — 60 items
BLADE SRGCLPR LF STRL PVT ADJ HD DISP (Blade)
BLADE SURGICAL CLIPPER PIVOT ADJUSTABLE (Blade) IMPLANT
BLADE SURGICAL CLIPPER PIVOT ADJUSTABLE HEAD 9661 PURPLE (Blade) IMPLANT
ENDO GIA ROTCLTR 60MM X 2.5MM (Staplers) ×8 IMPLANT
GLOVE SRG BGL M 8 LTX STRL PF TXTR BEAD (Glove) ×1
GLOVE SRG NTR RBR 7 BGL IND 288X91MM LTX (Glove) ×1
GLOVE SRG NTR RBR 8 INDCTR BGL 299X103MM (Glove) ×1
GLOVE SURG BIOGEL SZ6.5 (Glove) ×2 IMPLANT
GLOVE SURGICAL 7 BIOGEL INDICATOR POWDER (Glove) ×1 IMPLANT
GLOVE SURGICAL 7 BIOGEL INDICATOR POWDER FREE SMOOTH BEAD CUFF (Glove) ×1 IMPLANT
GLOVE SURGICAL 8 INDICATOR BIOGEL POWDER (Glove) ×1 IMPLANT
GLOVE SURGICAL 8 INDICATOR BIOGEL POWDER FREE SMOOTH BEAD CUFF (Glove) ×1 IMPLANT
GLOVE SURGICAL 8 POWDER FREE TEXTURE (Glove) ×1 IMPLANT
GLOVE SURGICAL 8 POWDER FREE TEXTURE BEAD CUFF NONPYROGENIC BIOGEL M (Glove) ×1 IMPLANT
KIT INFECTION CONTROL CUSTOM (Kits) ×2 IMPLANT
KIT INFECTION CONTROL CUSTOM IFOH03 (Kits) ×1 IMPLANT
LAPRATY (Suture) ×4 IMPLANT
PACK LAP GASTRIC DR MOAZZEZ (Pack) ×2 IMPLANT
REINFORCEMENT STAPLE LINE BIOABSORBABLE (Sealant) ×2 IMPLANT
REINFORCEMENT STAPLE LINE BIOABSORBABLE (Staple) ×1 IMPLANT
REINFORCEMENT STAPLE LINE BIOABSORBABLE CIRCULAR STAPLER GORE (Staple) ×1 IMPLANT
REINFORCEMENT STAPLE LINE BIOABSORBABLE GORE SEAMGUARD PURPLE ENDO GIA (Sealant) ×2 IMPLANT
REINFORCEMENT STPL LN SMGRD LF STRL (Sealant) ×2 IMPLANT
REINFORCEMENT STPL LN SMGRD LF STRL (Staple) ×1 IMPLANT
RELOAD  MEDIUM THICK 30 MM MEDIUM/THICK, INTELLIGENT RELOAD ×2 IMPLANT
RELOAD STAPLER 3 MM 3.5 MM 4 MM L45 MM (Staplers) ×2 IMPLANT
RELOAD STAPLER 3 MM 3.5 MM 4 MM L45 MM ENDO GIA TITANIUM MEDIUM THICK (Staplers) ×2 IMPLANT
RELOAD STAPLER 3 MM 3.5 MM 4 MM L60 MM (Staplers) ×1 IMPLANT
RELOAD STAPLER 3 MM 3.5 MM 4 MM L60 MM ENDO GIA TITANIUM MEDIUM THICK (Staplers) ×1 IMPLANT
RELOAD STPLR TI 3MM 3.5MM 4MM EGIA 45MM (Staplers) ×2
RELOAD STPLR TI 3MM 3.5MM 4MM EGIA 60MM (Staplers) ×1
SOLUTION IV LACTATED RINGERS 1000 ML (IV Solutions) ×1 IMPLANT
SOLUTION IV LACTATED RINGERS 1000 ML PLASTIC CONTAINER (IV Solutions) ×1 IMPLANT
SOLUTION IV LR 1000ML VFLX LF PLS CNTNR (IV Solutions) ×1
STAPLE EGIA 45MM CRVD TIP ARTI (Staplers) ×2 IMPLANT
STAPLER ENDGIA MED/THK CT 60MM (Staplers) ×2 IMPLANT
SURGIDAC ENDO STITCH 2-0 (Laparoscopy Supplies) ×6 IMPLANT
SUTURE ABS 0 VCL 54IN BRD TIE COAT VIOL (Suture) ×1
SUTURE ABS 2-0 SH VCL 27IN BRD COAT VIOL (Suture) ×4
SUTURE COATED VICRYL 0 L54 IN BRAID TIES (Suture) ×1 IMPLANT
SUTURE COATED VICRYL 2-0 SH L27 IN BRAID (Suture) ×4 IMPLANT
SUTURE COATED VICRYL 2-0 SH L27 IN BRAID COATED VIOLET ABSORBABLE (Suture) ×4 IMPLANT
SUTURE ENDOSTITCH SILK 0 48IN (Suture) ×2 IMPLANT
SUTURE ETHIBOND 2-0 SH 30IN (Suture) ×2 IMPLANT
SUTURE NABSB 2-0 SH PRLN 30IN MFL BLU (Suture) ×1
SUTURE PROLENE BLUE 2-0 SH L30 IN (Suture) ×1 IMPLANT
SUTURE PROLENE BLUE 2-0 SH L30 IN MONOFILAMENT NONABSORBABLE (Suture) ×1 IMPLANT
SYRINGE IRR 70CC TM LF STRL LL ADPR TIP (Syringes, Needles) ×1 IMPLANT
SYRINGE IRRIG TOOMEY STRL 70CC (Syringes, Needles) ×1
SYSTEM DRG DLV DEHP ONQ PNBSTR SLSKR 5IN (Catheter) ×2 IMPLANT
SYSTEM DRUG DELIVERY L5 IN CATHETER (Catheter) ×2 IMPLANT
SYSTEM DRUG DELIVERY L5 IN CATHETER EXPANSION KIT ON-Q* DEHP (Catheter) ×2 IMPLANT
SYSTEM IMAGING 8X6IN CLEARIFY MICROFIBER WARM HUB TRCR WIPE DSPSBL (Kits) ×1 IMPLANT
SYSTEM IMG MRFBR CLEARIFY 8X6IN WRM HUB (Kits) ×2 IMPLANT
TROCAR LAPAROSCOPIC BALLOON BLUNT TIP (Laparoscopy Supplies) ×1 IMPLANT
TROCAR LAPAROSCOPIC BALLOON BLUNT TIP L100 MM OD12 MM KII ABDOMINAL (Laparoscopy Supplies) ×1 IMPLANT
TROCAR LAPSCP KII 12MM 100MM LF STRL BLN (Laparoscopy Supplies) ×1
TUNNELER SRG ONQ 17GA 8IN LF STRL SHTH (Procedure Accessories) ×1
TUNNELER SURGICAL L8 IN SHEATH OD17 GA (Procedure Accessories) ×1 IMPLANT
TUNNELER SURGICAL L8 IN SHEATH OD17 GA ON-Q* (Procedure Accessories) ×1 IMPLANT

## 2015-10-22 NOTE — Interval H&P Note (Signed)
I have examined the patient and reviewed the H&P. No pertinent change in the patient's condition since the H&P was completed.    Sarafina Puthoff R. Averly Ericson, DO, FACS, FASMBS, FACOS  6:49 AM 10/22/2015

## 2015-10-22 NOTE — Anesthesia Postprocedure Evaluation (Signed)
Anesthesia Post Evaluation    Patient: Amber [name]    Procedures performed: Procedure(s):  LAPAROSCOPIC, GASTRIC BYPASS  INSERTION, PAIN PUMP (MEDICAL)  LAPAROSCOPIC, HERNIORRHAPHY, HIATAL    Anesthesia type: General ETT    Patient location:Phase I PACU    Last vitals:   Filed Vitals:    10/22/15 1010   BP:    Pulse:    Temp:    Resp: 14   SpO2:        Post pain: Patient not complaining of pain, continue current therapy      Mental Status:awake    Respiratory Function: CPAP/BIPAP    Cardiovascular: stable    Nausea/Vomiting: patient not complaining of nausea or vomiting    Hydration Status: adequate    Post assessment: no apparent anesthetic complications

## 2015-10-22 NOTE — Op Note (Signed)
Operative Report    Date Time: 10/22/2015 9:39 AM    Patient Name:   Amber [name]    Date of Operation:   10/22/2015    Providers Performing:    Surgeon(s) and Role:     * Adonias Demore R, DO - Primary      * Campoverde, Cindy, CSA - who was essential and present throughout the procedure to help in   positioning, transfer, port placement, skin closure, retraction and   exposure during critical parts of the procedure, also running the camera   and scope.   Operative Procedure:   Procedure(s):  LAPAROSCOPIC, ROUX-EN-Y GASTRIC BYPASS  LAPAROSCOPIC, HERNIORRHAPHY, HIATAL  INSERTION, PAIN PUMP (MEDICAL) 2  Preoperative Diagnosis:   Morbid obesity  BMI of 40   Hypertension.   Obstructive sleep apnea on CPAP.   Gastroesophageal reflux disease with esophagitis.   Hiatal hernia.   Anxiety.   Vitamin D deficiency.   Osteoarthritis bilateral knees   History of transient ischemic attacks    Postoperative Diagnosis:   Morbid obesity  BMI of 40   Hypertension.   Obstructive sleep apnea on CPAP.   Gastroesophageal reflux disease with esophagitis.   Hiatal hernia.   Anxiety.   Vitamin D deficiency.   Osteoarthritis bilateral knees   History of transient ischemic attacks    Anesthesia:   General    Estimated Blood Loss:   Minimal       Implants:     Implant Name Type Inv. Item Serial No. Manufacturer Lot No. LRB No. Used Action   [employer] ETHICON 25 - [id number] Staple STAPLR CIRCULAR ETHICON 25  W.L. GORE [id number] N/A 1 Implanted   [employer] [facility] BIOABSRB TRI60P - [id number] Sealant [employer] SEAMGRD BIOABSRB TRI60P   Dewitt Hoes [id number] N/A 2 Implanted       Drains:   Drains: no    Specimens:   None    Findings:   Roux limb at 100  Biliopancreatic limb at 50  Leak test was negative  Hiatal hernia  Complications:   None          INDICATIONS:   Amber [name] is a 64 y.o. very pleasant morbidly obese female  who has failed multiple previous attempts at conservative weight loss. The patient opted to  proceed with laparoscopic Roux-en-Y gastric bypass surgery as a surgical weight loss option after failing multiple previous attempts at conservative weight loss. The patient underwent an extensive preoperative education, workup, nutritional and psychiatric counseling, and was prepped and prepared for surgery. After meeting all the preoperative criteria, patient was scheduled for surgery and was admitted to the preoperative holding area on the operative day. The patient was identified by myself. All questions and concerns were addressed, and patient wished to proceed.     DESCRIPTION OF PROCEDURE:   The patient was brought to the operating room, was placed in supine position. General endotracheal anesthesia was instituted. A Foley catheter was inserted. Footboard was placed and pressure points were padded. Surgical pause was made. The abdomen was prepped and draped using normal sterile fashion. A Surgical pause was made followed by an incision was made in the supraumbilical region through which a 12-mm Optiview blunt-tipped trocar was placed under direct vision and laparoscope into the abdominal cavity. The abdomen was insufflated. No intraabdominal injuries from trocar insertion were noted. A 12-mm blunt-tipped trocar was placed in left flank under direct vision of the laparoscope. Another 12-mm blunt-tipped trocar was placed in between last 2  trocars, and a 12-mm blunt-tipped trocar was placed in the right upper quadrant.    The greater omentum, which was fairly bulky, was reflected above the transverse colon. The ligament of Treitz was identified through the transverse mesocolon. The jejunum was measured from that point to 50 cm, at which point, the small bowel was divided using a white  load #60 Endo-GIA. Distal Roux limb was marked and measured to 100 cm. Through 2 separate enterotomies, a side-to-side isoperistaltic jejunojejunostomy was performed between the distal biliopancreatic limb using and distal Roux  limb at about 100 cm using white  load #60 endo GIA. The staple line hemostasis was ensured intraluminally and the common enterotomy was closed using a white  load linear stapler. Hemostasis was ensured at the staple lines once again. The mesenteric defect was then closed using a running, locking #2-0 Surgidac suture. The patient was then placed in a reverse Trendelenburg position. The greater omentum was divided at the mid portion of transverse colon to minimize tension on the gastrojejunostomy and a Nathanson liver retractor was placed to retract the left lobe of liver anteriorly.     A hiatal hernia was identified.  The hernia sac was dissected until the right and left crus were exposed anteriorly.  The gastrohepatic gastrohepatic ligament was taken down using the Sonocision device at the cephalad portion.  This exposed the right crus column.  The adhesions were taken down using the Sonocision to free up the herniated posterior stomach and to expose the junction of the right and left crus posteriorly.  Careful dissection was then carried out bluntly until the esophagus was circumferentially freed and exposed.  I was able to dissect the esophagus at least 3 cm below the diaphragm.  The anterior fat pad of the stomach was removed using a Sonicision device. The stomach wasdivided perpendicular to the lesser curvature using a purple load Endo-GIA at about 4 to 5 cm distal to the GE junction in a perigastric fashion. Next, a gastrotomy was then made on the to-be-excluded part of stomach, through which an anvil for a 25-mm EEA circular stapler was introduced into the pouch. The shaft of the anvil was brought up through the staple line inferiorly. Over a bougie 40, a 15-mL proximal gastric pouch was then created by serially firing the purple load Endo-GIA with SeamGuards towards the angle of His. No fundus was involved with the creation of the pouch. The gastrotomy on the excluded part of stomach was then also closed  using a purple load endo GIA with SeamGuard. The staple line hemostasis was ensured.   The hiatal hernia was then repaired by approximating the right and left crus using a 0 silk Endo Stitch in a figure-of-eight fashion posterior to the esophagus over a 40 bougie.  Bowel continuity was then restored by performing gastrojejunostomy in an antecolic, antegastric, end-to-side fashion using a 25-mm EEA circular stapler, which was fired over circular SeamGuards. Two perfectly round anastomotic rings were noted. The 40-French bougie was then removed. Blind end of the roux limb was trimmed and closed using white  load #60 endo GIA.Bowel clamp was then placed on the Roux limb just past the gastrojejunostomy. An NG tube was placed in the pouch and then it was used to distend the pouch and the proximal Roux limb using oxygen flowing at 2 liters per minute while having the pouch and the proximal Roux limb were submerged under a pool of saline solution. After adequate distension was achieved, no extravasation of air  or bubbles was noted through the pouch staple line or through the gastrojejunostomy or through the blind end of the Roux limb. This test was repeated twice, and both times adequate distention was ensured, and neither time were any leaksor bubbles noted through the pouch staple line or through the gastrojejunostomy or through the blind end of the Roux limb.    The patient was then placed in supine position. The Vonita Moss defect was closed using a   running, locking #2-0 Surgidac suture.  The jejuno-jejunostomy was inspected and no hematoma, kinkage or bowel contents were noted.    Under direct vision of the laparoscope, a tunneler and percutaneous sheath were advanced in the preperitoneal space of the left and right upper quadrant subcostal region. The tunneler were then replaced with a SilverSoakers and the SilverSoakers wer bolused with 4 mL of 0.5% Marcaine solution after the percutaneous sheath was removed. The  On-Q pump with 540 mL of Marcaine at 4 ml per hour was then secured onto the SilverSoakers.     Under direct vision of the laparoscope all the trocar sites fascial openings were closed using #0 Vicryl suture and using the Carter-Thomason fascial CloseSure device. The trocars were all removed under direct vision. No bleeding was noted from any of the trocar sites. Skin incisions were approximated using 4-0 Monocryl suture. Sterile dressing was applied. The patient was then extubated and taken to the post anesthesia care unit without any complications, having tolerated the procedure well.  Needle, instrument and sponge count was correct at the end of the case.       Signed by: Nicola Police, DO                                                                               [place] [facility]

## 2015-10-22 NOTE — Progress Notes (Signed)
MD Elpidio Anis to bedside evaluating patient, clears patient for d/c to 5th floor.

## 2015-10-22 NOTE — Progress Notes (Signed)
Patient admitted from PACU via bed at 1150. Patient drowsy but easily awakened. Abdominal incisions are clean dry intact/ open to air with dermabond. OnQ pump in place with dressing clean dry intact. Patient is NPO.  Family at bedside. SCDs and MASIMO are on. Patient is instructed to call RN or Tech before getting out of bed by self. Patient belongings and CPAP are in room.

## 2015-10-22 NOTE — H&P (View-Only) (Signed)
Assessment:  Morbid obesity  BMI of 40   Hypertension.   Obstructive sleep apnea on CPAP.   Gastroesophageal reflux disease with esophagitis.   Hiatal hernia.   Anxiety.   Vitamin D deficiency.   Osteoarthritis bilateral knees   History of transient ischemic attacks          Plan:  This is a 64 y.o. year old morbidly obese female who has failed multiple previous attempts at conservative weight loss and has opted to proceed with laparoscopic RNY-Gastric Bypass surgery.  All questions and concerns were addressed.  The risks, benefits and alternate options were discussed with in detail.  The risks include but are not limited to bleeding, infection, sepsis, shock, wound infection, wound herniation, DVT, PE, death, as well as unforeseen conditions such as  inadequate weight loss, excessive weight loss, weight regain, anastomotic leaks, anastomotic ulcers, and anastomotic strictures.  Patient wishes to proceed with above mentioned plan and surgery at this time.        History of Present Illness:  The patient is a pleasant 64 y.o. year old morbidly obese female  who has failed multiple previous attempts at conservative weight loss. She has opted to proceed with laparoscopic RNY-Gastric Bypass as a surgical weight loss option.  All other options were discussed in detail. The patient has undergone an extensive preoperative education and medical clearance along with dietary and psychiatric counseling.      Bariatric comorbidities present: hypertension, GERD, osteoarthritis and obstructive sleep apnea on CPAP  The following portions of the patient's history were reviewed and updated as appropriate: allergies, current medications, past family history, past medical history, past social history, past surgical history and problem list.  CURRENT PROBLEM LIST:   Patient Active Problem List   Diagnosis   . Essential hypertension   . Anxiety state, unspecified   . Abnormal electrocardiogram   . Cerebral infarction   . Gastroesophageal  reflux disease   . Hypernatremia   . Paresthesia   . Slow transit constipation   . Vitamin D deficiency   . Overactive bladder   . S/P laparoscopic cholecystectomy   . Transient cerebral ischemia, unspecified type   . Palpitations   . Pituitary microadenoma   . Morbid obesity with BMI of 40.0-44.9, adult   . Dyslipidemia   . Other long term (current) drug therapy   . Headache   . History of spinal surgery   . Obstructive sleep apnea syndrome   . Hiatal hernia   . Osteoarthritis of knees, bilateral   . Pre-op evaluation     PAST MEDICAL HISTORY:   Past Medical History   Diagnosis Date   . GERD (gastroesophageal reflux disease)    . Hiatal hernia    . Genital herpes      no current outbreak (10/03/15)   . Hyperlipidemia    . Osteoarthritis of knees, bilateral    . Nausea without vomiting    . OSA on CPAP 2015     CPAP nightly x 1 yr   . Difficulty walking      amb with cane secondary to arthritis bil knees   . Morbid obesity with BMI of 40.0-44.9, adult      BMI 40.9   . TIA (transient ischemic attack) 2014     TIA 2014-no residual. Patient evaulated for possible TIA 07/12/2015  @ [facility] (dischage summary in epic)-per summary CT of head was normal and patient was d/c'd   . Hypertensive disorder  Well controlled on med. Denies any SOB in last 6 months (10/03/15)   . Chest pain 2016      Chest pain after eating x6  months--being followed by GI for GERD     PAST SURGICAL HISTORY:   Past Surgical History   Procedure Laterality Date   . Ovary surgery Right >25 yrs   . Fallopian tube surgery  >25 yrs   . Laparoscopic, cholecystectomy, cholangiogram  08/13/2014   . Appendectomy  >25 yrs   . Spine surgery  11/2013     cervical HNP x 2, Dr. Bufford Buttner   . Egd  01/2015     FAMILY HISTORY:    Family History   Problem Relation Age of Onset   . Cancer Mother 46     breast cancer     SOCIAL HISTORY:   Social History     Social History   . Marital Status: Single     Spouse Name: N/A   . Number of Children: 2   . Years of Education: N/A      Occupational History   . Child psychotherapist      Social History Main Topics   . Smoking status: Never Smoker    . Smokeless tobacco: Never Used   . Alcohol Use: No   . Drug Use: No   . Sexual Activity: No     Other Topics Concern   . Dietary Supplements / Vitamins Yes   . Anesthesia Problems No   . Blood Thinners No   . Eats Large Amounts No   . Excessive Sweets No   . Skips Meals No   . Eats Excessive Starches Yes   . Snacks Or Grazes No   . Emotional Eater Yes   . Eats Fried Food Yes   . Eats Fast Food Yes   . Diet Center No   . Hmr No   . [name] No   . La Weight Loss No   . Nutri-System No   . Opti-Fast / Medi-Fast No   . Overeaters Anonymous No   . Phys[facility] Weight Loss Center No   . Tops No   . Weight Watchers No   . Atkins No   . Binging / Purging No   . Body For Life No   . Cabbage Soup No   . Calorie Counting No   . Fasting No   . [name] No   . Health Spa No   . Herbal Life No   . High Protein No   . Low Carb No   . Low Fat No   . [facility] Diet No   . [place] Diet No   . Margie Billet Diet No   . [place] Diet No   . Slim Fast No   . [place] No   . Sugar Busters No   . Vomiting No   . Zone Diet No   . Stationary Cycle Or Treadmill No   . Gym/Fitness Classes No   . Home Exercise/Video No   . Swimming No   . Team Sports No   . Weight Training No   . Walking Or Running No   . Hospitalization No   . Hypnosis No   . Physical Therapy No   . Psychological Therapy No   . Residential Program No   . Acutrim No   . Amphetamines No   . Anorex No   . Byetta No   . Dexatrim No   .  Didrex No   . Fastin No   . Fen - Phen No   . Ionamin / Adipex No   . Mazanor No   . Meridia No   . Obalan No   . Phendiet No   . Phentrol No   . Phenteramine Yes   . Plegine No   . Pondimin No   . Qsymia No   . Prozac No   . Redux No   . Sanorex No   . Ten[place] No   . Tepanole No   . Wechless No   . Wellbutrin No   . Xenical (Orlistat, Alli) No   . Other Med No   . No Impairment Yes     Chronic Bilat Knee Pain    . Walks With Cane/Crutch Yes   . Requires A Wheelchair No   . Bedridden No   . Are You Currently Being Treated For Depression? No   . Do You Snore? Yes   . Are You Receiving Any Medical Or Psychological Services? No   . Do You Have Or Have You Been Treated For An Eating Disorder? No   . Do You Exercise Regularly? No   . Have You Or Family Member Ever Have Trouble With Anesthesia? No     Social History Narrative    (Does not refresh)  TOBACCO HISTORY:   History   Smoking status   . Never Smoker    Smokeless tobacco   . Never Used     ALCOHOL HISTORY:   History   Alcohol Use No     DRUG HISTORY:   History   Drug Use No     CURRENT HOSPITAL MEDICATIONS:   Current Outpatient Prescriptions   Medication Sig Dispense Refill   . albuterol (PROVENTIL HFA;VENTOLIN HFA) 108 (90 BASE) MCG/ACT inhaler Inhale into the lungs every 4 (four) hours as needed.        Marland Kitchen aspirin EC 81 MG EC tablet Take 81 mg by mouth daily.     Marland Kitchen atenolol (TENORMIN) 50 MG tablet TAKE 1 TABLET(50 MG) BY MOUTH DAILY (Patient taking differently: TAKE 1 TABLET(50 MG) BY MOUTH every morning) 90 tablet 0   . cholecalciferol (VITAMIN D3) 1000 UNITS tablet Take 1,000 Units by Mouth Once a Day.     . desonide (DESOWEN) 0.05 % cream Apply topically 2 (two) times daily as needed. 60 g 2   . DILT-XR 240 MG 24 hr capsule TAKE 1 CAPSULE(240 MG) BY MOUTH DAILY (Patient taking differently: TAKE 1 CAPSULE(240 MG) BY MOUTH every morning) 90 capsule 0   . Fluocinolone Acetonide 0.01 % Oil Place 2 drops in ear(s) 3 (three) times daily. 1 Bottle 0   . fluticasone (FLONASE) 50 MCG/ACT nasal spray 2 sprays by Nasal route daily. (Patient taking differently: 2 sprays by Nasal route as needed.   ) 16 g 2   . hydrochlorothiazide (MICROZIDE) 12.5 MG capsule Take 1 capsule (12.5 mg total) by mouth every morning. 90 capsule 1   . potassium chloride (K-TAB,KLOR-CON) 10 MEQ tablet Take 1 tablet (10 mEq total) by mouth daily. 90 tablet 1   . valACYclovir HCL (VALTREX) 500 MG tablet  Take 1 tablet (500 mg total) by mouth daily. (Patient taking differently: Take 500 mg by mouth as needed.   ) 90 tablet 1   . enoxaparin (LOVENOX) 40 MG/0.4ML Solution Inject 0.4 mLs (40 mg total) into the skin daily. 14 Syringe 0   . ondansetron (ZOFRAN-ODT) 4 MG disintegrating tablet Take  1 tablet (4 mg total) by mouth every 8 (eight) hours as needed for Nausea. 20 tablet 0   . oxyCODONE (ROXICODONE) 5 MG/5ML solution Take 5-10 mLs (5-10 mg total) by mouth every 4 (four) hours as needed for Pain. 250 mL 0   . pantoprazole (PROTONIX) 40 MG tablet Take 1 tablet (40 mg total) by mouth daily. 30 tablet 2     No current facility-administered medications for this visit.     CURRENT OUTPATIENT MEDICATIONS:   Outpatient Prescriptions Marked as Taking for the 10/14/15 encounter (Clinical Support) with Jearldean Gutt R, DO   Medication Sig Dispense Refill   . albuterol (PROVENTIL HFA;VENTOLIN HFA) 108 (90 BASE) MCG/ACT inhaler Inhale into the lungs every 4 (four) hours as needed.        Marland Kitchen aspirin EC 81 MG EC tablet Take 81 mg by mouth daily.     Marland Kitchen atenolol (TENORMIN) 50 MG tablet TAKE 1 TABLET(50 MG) BY MOUTH DAILY (Patient taking differently: TAKE 1 TABLET(50 MG) BY MOUTH every morning) 90 tablet 0   . cholecalciferol (VITAMIN D3) 1000 UNITS tablet Take 1,000 Units by Mouth Once a Day.     . desonide (DESOWEN) 0.05 % cream Apply topically 2 (two) times daily as needed. 60 g 2   . DILT-XR 240 MG 24 hr capsule TAKE 1 CAPSULE(240 MG) BY MOUTH DAILY (Patient taking differently: TAKE 1 CAPSULE(240 MG) BY MOUTH every morning) 90 capsule 0   . Fluocinolone Acetonide 0.01 % Oil Place 2 drops in ear(s) 3 (three) times daily. 1 Bottle 0   . fluticasone (FLONASE) 50 MCG/ACT nasal spray 2 sprays by Nasal route daily. (Patient taking differently: 2 sprays by Nasal route as needed.   ) 16 g 2   . hydrochlorothiazide (MICROZIDE) 12.5 MG capsule Take 1 capsule (12.5 mg total) by mouth every morning. 90 capsule 1   . potassium chloride  (K-TAB,KLOR-CON) 10 MEQ tablet Take 1 tablet (10 mEq total) by mouth daily. 90 tablet 1   . valACYclovir HCL (VALTREX) 500 MG tablet Take 1 tablet (500 mg total) by mouth daily. (Patient taking differently: Take 500 mg by mouth as needed.   ) 90 tablet 1     ALLERGIES:   Allergies   Allergen Reactions   . Acyclovir Nausea And Vomiting   . Amoxicillin-Pot Clavulanate      Other reaction(s): gi distress   . Aspirin Nausea And Vomiting     Other reaction(s): gi distress  Upsets stomach  Able to tolerate enteric coated aspirin   . Atorvastatin      Palpitation   . Lovastatin Nausea And Vomiting   . Metronidazole Nausea And Vomiting   . Moxifloxacin Nausea And Vomiting     GI symptoms   . Other Nausea And Vomiting   . Rosuvastatin      Palpitation, chest pain, abd pain    . Statins Nausea And Vomiting     Other reaction(s): gi distress   . Sulfa Antibiotics Nausea And Vomiting     Other reaction(s): gi distress        Review of Systems  Constitutional: negative for fevers, night sweats  Respiratory: negative for SOB, cough  Cardiovascular: negative for chest pain, palpitations  Gastrointestinal: negative for nausea or vomiting  Genitourinary:negative for hematuria, dysuria  Musculoskeletal:negative for bone pain, myalgias and stiff joints  Neurological: negative for dizziness, gait problems, headaches and memory problems  Behavioral/Psych: negative for fatigue, loss of interest in favorite activities, separation anxiety and  sleep disturbance  Endocrine: negative for temperature intolerance    Objective:    BP 142/84 mmHg  Pulse 69  Temp(Src) 97.8 F (36.6 C)  Ht 5\' 1"   Wt 214 lb  BMI 40.46 kg/m2  Body mass index is 40.46 kg/(m^2).  Weight: 214 lb       General Appearance:    Alert, cooperative, no distress, obese   Head:    Normocephalic, without obvious abnormality, atraumatic   Eyes:    No scleral icterus            Throat:   Lips, mucosa, and tongue normal; teeth and gums normal   Neck:   Supple, symmetrical,  trachea midline, no adenopathy;        thyroid:  No enlargement/tenderness/nodules; no carotid    bruit or JVD   Back:     Symmetric, no curvature, ROM normal, no CVA tenderness   Lungs:     Clear to auscultation bilaterally, respirations unlabored   Chest wall:    No tenderness or deformity   Heart:    Regular rate and rhythm, S1 and S2 normal, no murmur, rub   or gallop   Abdomen:     Soft, non-tender, bowel sounds active all four quadrants,     no masses, no organomegaly   Extremities:   Extremities normal, atraumatic, no cyanosis or edema   Pulses:   2+ and symmetric all extremities   Skin:   Skin color, texture, turgor normal, no rashes or lesions   Lymph nodes:   Cervical, supraclavicular, and axillary nodes normal   Neurologic:   Alert and oriented x 3.  Able to move all extremities.       Data Review:     Office Visit on 09/25/2015   Component Date Value Ref Range Status   . Vitamin D, 25-OH, Total 09/25/2015 31  30 - 100 ng/mL Final   . Vitamin D, 25-OH, D3 09/25/2015 14   Final   . 25-Hydroxy D2 09/25/2015 17   Final    Comment: 25-OHD3 indicates both endogenous production and  supplementation. 25-OHD2 is an indicator of  exogenous sources such as diet or supplementation.  Therapy is based on measurement of Total 25-OHD,  with levels <20 ng/mL indicative of Vitamin D  deficiency while levels between 20 ng/mL and 30  ng/mL suggest insufficiency. Optimal levels are  > or = 30 ng/mL.  For more information on this test, go to  http://education.[facility].com/faq/FAQ163     . Vitamin A (Retinol) 09/25/2015 44  38 - 98 Final    Comment: Vitamin supplementation within 24 hours prior to  blood draw may affect the accuracy of the results.  This test was developed and its analytical performance  characteristics have been determined by [facility] [place], [place]. It has  not been cleared or approved by the U.S. Food and Drug  Administration. This assay has been validated pursuant  to the  CLIA regulations and is used for clinical  purposes.  Test Performed by Clerance Lav,  [facility],  [address] [place], [place] [address]  Si Raider, M.D., Ph.D., Director of Laboratories  [phone], CLIA [id number]     . Vitamin B1 (Thiamine) 09/25/2015 10  8 - 30 nmol/L Final    Comment: Vitamin supplementation within 24 hours prior to  blood draw may affect the accuracy of the results.  This test was developed and its analytical performance  characteristics have been determined  by [facility], [place], [place]. It has  not been cleared or approved by the U.S. Food and Drug  Administration. This assay has been validated pursuant  to the CLIA regulations and is used for clinical  purposes.  Test Performed by Clerance Lav,  [facility],  [address], [place], [place] [address]  Si Raider, M.D., Ph.D., Director of Laboratories  [phone], CLIA [id number]     . Cholesterol 09/25/2015 255* 0 - 199 mg/dL Final   . Triglycerides 09/25/2015 63  34 - 149 mg/dL Final   . HDL 13/06/6577 64  40 - 9999 mg/dL Final    Comment: HDL:       Less than 40 mg/dL - Major risk heart disease       Greater than or equal to 60 mg/dL - Negative risk factor       for heart disease     . LDL Calculated 09/25/2015 469* 0 - 99 mg/dL Final   . VLDL Cholesterol Cal 09/25/2015 13  10 - 40 mg/dL Final   . CHOL/HDL Ratio 09/25/2015 4.0  See Below Final    Comment: Chol/HDL Ratio:  Classification                   Female     Female  Very Low (1/2 Average Risk)      <3.4     <3.3  Low Risk                         4.0      3.8  Average Risk                     5.0      4.5  Moderate Risk (2X Average risk)  9.5      7.0  High Risk (3X Average Risk)      >23.0    >11.0     . Folate 09/25/2015 13.1  See below ng/mL Final    Comment: Deficient    : <3.5 ng/mL  Intermediate : 3.5 - 5.4 ng/mL  Normal       : Greater Than 5.4 ng/mL     . Vitamin B-12  09/25/2015 475  211 - 911 pg/mL Final   . WBC 09/25/2015 4.23  3.50 - 10.80 x10 3/uL Final   . Hgb 09/25/2015 13.3  12.0 - 16.0 g/dL Final   . Hematocrit 62/95/2841 41.0  37.0 - 47.0 % Final   . Platelets 09/25/2015 276  140 - 400 x10 3/uL Final   . RBC 09/25/2015 4.88  4.20 - 5.40 x10 6/uL Final   . MCV 09/25/2015 84.0  80.0 - 100.0 fL Final   . MCH 09/25/2015 27.3* 28.0 - 32.0 pg Final   . MCHC 09/25/2015 32.4  32.0 - 36.0 g/dL Final   . RDW 32/44/0102 16* 12 - 15 % Final   . MPV 09/25/2015 10.2  9.4 - 12.3 fL Final   . Nucleated RBC 09/25/2015 0  0 - 1 /100 WBC Final   . Glucose 09/25/2015 86  70 - 100 mg/dL Final    Comment: ADA guidelines for diabetes mellitus:  Fasting:  Equal to or greater than 126 mg/dL  Random:   Equal to or greater than 200 mg/dL     . BUN 09/25/2015 11.0  7.0 - 19.0 mg/dL Final   . Creatinine 72/53/6644 0.8  0.4 - 1.5  mg/dL Final   . Sodium 16/08/9603 142  135 - 146 mEq/L Final   . Potassium 09/25/2015 3.7  3.5 - 5.3 mEq/L Final   . Chloride 09/25/2015 106  100 - 111 mEq/L Final   . CO2 09/25/2015 28  21 - 30 mEq/L Final   . Calcium 09/25/2015 9.8  8.5 - 10.5 mg/dL Final   . Protein, Total 09/25/2015 6.9  6.0 - 8.3 g/dL Final   . Albumin 54/07/8118 3.8  3.5 - 5.0 g/dL Final   . AST (SGOT) 14/78/2956 17  5 - 34 U/L Final   . ALT 09/25/2015 12  0 - 55 U/L Final   . Alkaline Phosphatase 09/25/2015 133* 37 - 106 U/L Final   . Bilirubin, Total 09/25/2015 0.5  0.1 - 1.2 mg/dL Final   . Globulin 21/30/8657 3.1  2.0 - 3.7 g/dL Final   . Albumin/Globulin Ratio 09/25/2015 1.2  0.9 - 2.2 Final   . Hemoglobin A1C 09/25/2015 5.6  4.6 - 5.9 % Final    Comment: HBA1C: Hemoglobin A1c values of 5.7-6.4% indicate an increased  risk for developing diabetes mellitus. Hemoglobin A1c values  greater than or equal to 6.5% are diagnostic of diabetes  mellitus.     . Average Estimated Glucose 09/25/2015 114.0   Final   . Copper 09/25/2015 136  70 - 175 Final    Comment: Test Performed by Clerance Lav,  [facility],  [address] [place], [place] [address]  Si Raider, M.D., Ph.D., Director of Laboratories  [phone], CLIA [id number]     . PTH Intact 09/25/2015 54.0  9.0 - 72.0 pg/mL Final   . Iron 09/25/2015 102  40 - 145 ug/dL Final   . UIBC 64/40/3474 286  126 - 382 ug/dL Final   . TIBC 25/95/6387 388  265 - 497 ug/dL Final   . Iron Saturation 09/25/2015 26  15 - 50 % Final   . Hemolysis Index 09/25/2015 1  0 - 18 Final   . Thyroid Stimulating Hormone 09/25/2015 0.88  0.35 - 4.94 uIU/mL Final   . EGFR 09/25/2015 >60.0   Final    Comment: Disease State Reference Ranges:    Chronic Kidney Disease; < 60 ml/min/1.73 sq.m    Kidney Failure; < 15 ml/min/1.73 sq.m    [Calculated using IDMS-Traceable MDRD equation (based on    gender, age and black vs. non-black race) recommended by    [employer] Kidney Disease Education Program. No data    available for non-white, non-black race.]  GFR estimates are unreliable in patients with:    Rapidly changing kidney function or recent dialysis,    extreme age, body size or body composition(obesity,    severe malnutrition). Abnormal muscle mass (limb    amputation, muscle wasting). In these patients,    alternative determinations of GFR should be obtained.     Appointment on 09/25/2015   Component Date Value Ref Range Status   . PTT 09/25/2015 29  23 - 37 sec Final    Comment: In vivo therapeutic range of heparin (0.3 - 0.7 IU/mL)  correlate with the following APTT times: 64 - 102 seconds.     . APTT Anticoag. Given w/i 48 hrs. 09/25/2015 Unknown   Final     .  Radiology Results for the past 90 days.   @[email or web address] @      Jerry Clyne R. Cornie Mccomber, DO, FACS, FASMBS, FACOS

## 2015-10-22 NOTE — Transfer of Care (Signed)
Anesthesia Transfer of Care Note    Patient: Amber [name]    Procedures performed: Procedure(s):  LAPAROSCOPIC, GASTRIC BYPASS  INSERTION, PAIN PUMP (MEDICAL)  LAPAROSCOPIC, HERNIORRHAPHY, HIATAL    Anesthesia type: General ETT    Patient location:Phase I PACU    Last vitals:   Filed Vitals:    10/22/15 0636   BP: 162/90   Pulse: 65   Temp: 36.6 C (97.8 F)   Resp: 20   SpO2: 100%       Post pain: Continue adjustment of pain medication; per PACU protocol     Mental Status:awake    Respiratory Function: CPAP/BIPAP    Cardiovascular: stable    Nausea/Vomiting: patient not complaining of nausea or vomiting    Hydration Status: adequate    Post assessment: no apparent anesthetic complications

## 2015-10-22 NOTE — UM Notes (Incomplete)
NAME:  Amber [name], Amber [name]  DOB:  May 07, 1951       PMH:    Past Medical History   Diagnosis Date   . GERD (gastroesophageal reflux disease)    . Hiatal hernia    . Genital herpes      no current outbreak (10/03/15)   . Hyperlipidemia    . Osteoarthritis of knees, bilateral    . Nausea without vomiting    . OSA on CPAP 2015     CPAP nightly x 1 yr   . Difficulty walking      amb with cane secondary to arthritis bil knees   . Morbid obesity with BMI of 40.0-44.9, adult      BMI 40.9   . TIA (transient ischemic attack) 2014     TIA 2014-no residual. Patient evaulated for possible TIA 07/12/2015  @ [facility] (dischage summary in epic)-per summary CT of head was normal and patient was d/c'd   . Hypertensive disorder      Well controlled on med. Denies any SOB in last 6 months (10/03/15)   . Chest pain 2016      Chest pain after eating x6  months--being followed by GI for GERD          ELECTIVE PROCEDURE DONE:  Procedure(s):  LAPAROSCOPIC, GASTRIC BYPASS  INSERTION, PAIN PUMP (MEDICAL)  LAPAROSCOPIC, HERNIORRHAPHY, HIATAL         _____________________________________________      ** This clinical review is compiled from documentation provided by the treatment team in the medical record. **    _____________________________________________    Lauretta Grill) Lucretia Roers, RN, BSN, BS  Utilization Review Case Manager  Case Management Department  [facility]  [address]  [place], [place] [address]  Phone:  [phone]  Fax:  [phone]  Case Management Main Phone:  [phone]    [facility] Tax ID:  [id number]  NPI:  [id number]    Please use fax number [phone] to provide authorization for hospital services or to request additional information.  ------------------------------------------------------------------------

## 2015-10-22 NOTE — Plan of Care (Signed)
Problem: Safety  Goal: Patient will be free from injury during hospitalization  Outcome: Progressing  Patient will be free from falls, purposeful rounding preformed. Patient encouraged to call for assistance prior to getting out of bed. Call light and bedside table are within reach, and bed maintained in low position. MASIMO and bed alarm are on. SCDs in use, will continue to monitor pt. Status and medicate PRN.     Problem: Pain  Goal: Patient's pain/discomfort is manageable  Outcome: Progressing  Patient's pain is adequately managed with PRN and scheduled medication.     Problem: Psychosocial and Spiritual Needs  Goal: Demonstrates ability to cope with hospitalization/illness  Outcome: Progressing  Family member at bedside for support.

## 2015-10-23 ENCOUNTER — Encounter (INDEPENDENT_AMBULATORY_CARE_PROVIDER_SITE_OTHER): Payer: Self-pay

## 2015-10-23 ENCOUNTER — Inpatient Hospital Stay: Payer: Commercial Managed Care - HMO

## 2015-10-23 ENCOUNTER — Encounter: Payer: Self-pay | Admitting: Surgery

## 2015-10-23 DIAGNOSIS — Z9884 Bariatric surgery status: Secondary | ICD-10-CM

## 2015-10-23 DIAGNOSIS — G8918 Other acute postprocedural pain: Secondary | ICD-10-CM | POA: Diagnosis not present

## 2015-10-23 LAB — CBC AND DIFFERENTIAL
Basophils Absolute Automated: 0.01 10*3/uL (ref 0.00–0.20)
Basophils Automated: 0 %
Eosinophils Absolute Automated: 0.03 10*3/uL (ref 0.00–0.70)
Eosinophils Automated: 0 %
Hematocrit: 35.2 % — ABNORMAL LOW (ref 37.0–47.0)
Hgb: 11 g/dL — ABNORMAL LOW (ref 12.0–16.0)
Immature Granulocytes Absolute: 0.02 10*3/uL
Immature Granulocytes: 0 %
Lymphocytes Absolute Automated: 1 10*3/uL (ref 0.50–4.40)
Lymphocytes Automated: 10 %
MCH: 26 pg — ABNORMAL LOW (ref 28.0–32.0)
MCHC: 31.3 g/dL — ABNORMAL LOW (ref 32.0–36.0)
MCV: 83.2 fL (ref 80.0–100.0)
MPV: 9.3 fL — ABNORMAL LOW (ref 9.4–12.3)
Monocytes Absolute Automated: 1.11 10*3/uL (ref 0.00–1.20)
Monocytes: 11 %
Neutrophils Absolute: 7.99 10*3/uL (ref 1.80–8.10)
Neutrophils: 79 %
Nucleated RBC: 0 /100 WBC (ref 0–1)
Platelets: 287 10*3/uL (ref 140–400)
RBC: 4.23 10*6/uL (ref 4.20–5.40)
RDW: 16 % — ABNORMAL HIGH (ref 12–15)
WBC: 10.14 10*3/uL (ref 3.50–10.80)

## 2015-10-23 LAB — BASIC METABOLIC PANEL
Anion Gap: 7 (ref 5.0–15.0)
BUN: 8 mg/dL (ref 7–19)
CO2: 27 mEq/L (ref 22–29)
Calcium: 8.3 mg/dL — ABNORMAL LOW (ref 8.5–10.5)
Chloride: 103 mEq/L (ref 100–111)
Creatinine: 0.8 mg/dL (ref 0.6–1.0)
Glucose: 143 mg/dL — ABNORMAL HIGH (ref 70–100)
Potassium: 4 mEq/L (ref 3.5–5.1)
Sodium: 137 mEq/L (ref 136–145)

## 2015-10-23 LAB — MAGNESIUM: Magnesium: 1.8 mg/dL (ref 1.6–2.6)

## 2015-10-23 LAB — GFR: EGFR: >60

## 2015-10-23 LAB — PHOSPHORUS: Phosphorus: 3 mg/dL (ref 2.3–4.7)

## 2015-10-23 LAB — CK: Creatine Kinase (CK): 127 U/L (ref 29–168)

## 2015-10-23 MED ORDER — IOHEXOL 350 MG/ML IV SOLN
100.0000 mL | Freq: Once | INTRAVENOUS | Status: AC | PRN
Start: 2015-10-23 — End: 2015-10-23
  Administered 2015-10-23: 100 mL via ORAL

## 2015-10-23 MED ORDER — OXYCODONE HCL 5 MG/5ML PO SOLN
10.0000 mg | ORAL | Status: DC | PRN
Start: 2015-10-23 — End: 2015-10-24
  Administered 2015-10-23 – 2015-10-24 (×4): 10 mg via ORAL
  Filled 2015-10-23 (×2): qty 10
  Filled 2015-10-23: qty 5
  Filled 2015-10-23 (×3): qty 10

## 2015-10-23 MED ORDER — OXYCODONE HCL 5 MG/5ML PO SOLN
5.0000 mg | ORAL | Status: DC | PRN
Start: 2015-10-23 — End: 2015-10-24

## 2015-10-23 MED ORDER — SODIUM CHLORIDE 0.9 % IV SOLN
INTRAVENOUS | Status: DC
Start: 2015-10-23 — End: 2015-10-24

## 2015-10-23 MED ORDER — DILTIAZEM HCL 60 MG PO TABS
60.0000 mg | ORAL_TABLET | Freq: Four times a day (QID) | ORAL | Status: DC
Start: 2015-10-23 — End: 2016-01-15

## 2015-10-23 NOTE — Plan of Care (Signed)
Problem: Moderate/High Fall Risk Score >5  Goal: Patient will remain free of falls  Outcome: Progressing  Bed alarm on. Call light in reach. Hourly rounding.    Problem: Day of Surgery-Gastric Bypass  Goal: Patient has stable vital signs and fluid balance.  Outcome: Progressing  VSS. O2 maintained on room air. Voiding well.  Goal: Patient will maintain Adequate Oxygenation  Outcome: Progressing  O2 sat maintained on room air.  Goal: Pain at adequate level as identified by patient  Outcome: Progressing  Pain controlled with IV meds.  Goal: Tissue perfusion is adequate  Outcome: Progressing  SCDs maintained. Ambulating in hallway and room.  Goal: Mobility/activity is maintained at optimum level for patient  Outcome: Progressing  Ambulating in hallway and room.  Goal: Patient's nutritional intake is adequate (Gastric Bypass)  Outcome: Progressing  NPO currently. No N/V.

## 2015-10-23 NOTE — Consults (Signed)
Inpatient Bariatric Nutrition Education       Surgery Type: Laparscopic Gastric Bypass  Surgery Date: 10/22/15    Nutrition education done by surgeon's RD pre-operatively: Yes    Reinforced nutritional guidelines: Yes    Instructed patient on adequate fluid intake with goal of drinking 64 ounces/day: Yes    Patient verbalized understanding of diet progression: Yes    Patient has home supply of protein supplements: Yes    Patient has home supply of vitamins and minerals: Yes    Going Home Nutrition information reviewed with patient: Yes    Educational materials/handouts given to patient: Yes    Patient verbalized comprehension of Bariatric Nutrition guidelines to follow upon discharge: Yes    Notes: Questions answered.      Alben Spittle MS RD  Bariatric Nutrition Educator  [facility]

## 2015-10-23 NOTE — Progress Notes (Signed)
Anesthesia Post Evaluation    Patient: Amber [name]    Procedures performed: Procedure(s):  LAPAROSCOPIC, GASTRIC BYPASS  INSERTION, PAIN PUMP (MEDICAL)  LAPAROSCOPIC, HERNIORRHAPHY, HIATAL    Anesthesia type: General ETT    Patient location:Med Surgical Floor    Last vitals:   Filed Vitals:    10/23/15 1156   BP: 179/80   Pulse: 81   Temp: 37.1 C (98.8 F)   Resp: 18   SpO2: 92%       Post pain: Patient not complaining of pain, continue current therapy      Mental Status:awake    Respiratory Function: tolerating room air    Cardiovascular: stable    Nausea/Vomiting: patient not complaining of nausea or vomiting    Hydration Status: adequate    Post assessment: no apparent anesthetic complications    Tye [place]

## 2015-10-23 NOTE — UM Notes (Signed)
NAME:  Amber [name], Amber [name]  DOB:  01-Jun-1951       PMH:    Past Medical History   Diagnosis Date   . GERD (gastroesophageal reflux disease)    . Hiatal hernia    . Genital herpes      no current outbreak (10/03/15)   . Hyperlipidemia    . Osteoarthritis of knees, bilateral    . Nausea without vomiting    . OSA on CPAP 2015     CPAP nightly x 1 yr   . Difficulty walking      amb with cane secondary to arthritis bil knees   . Morbid obesity with BMI of 40.0-44.9, adult      BMI 40.9   . TIA (transient ischemic attack) 2014     TIA 2014-no residual. Patient evaulated for possible TIA 07/12/2015  @ [facility] (dischage summary in epic)-per summary CT of head was normal and patient was d/c'd   . Hypertensive disorder      Well controlled on med. Denies any SOB in last 6 months (10/03/15)   . Chest pain 2016      Chest pain after eating x6  months--being followed by GI for GERD          ELECTIVE PROCEDURE DONE 10/22/15:  LAPAROSCOPIC, GASTRIC BYPASS  INSERTION, PAIN PUMP (MEDICAL)  LAPAROSCOPIC, HERNIORRHAPHY, HIATAL  Cpt code:  [address]    10/22/15 6045  Admit to Inpatient      Admitted to surgical unit post-op.  IVF at 100 ml/hr, NPO.       -------------------10/23/15-------------------  BP: 104/56   Pulse: 72   Temp: 97.9 F (36.6 C)   Resp: 16   SpO2: 99%              C/o severe pain, requiring frequent prns.    Prns given 12/21 as of 5pm:  Dilaudid 1mg  IV x 6, roxicodone x 1.    Plan:   UGI this am, if ok, will advance to bariatric clear liquid diet and start PO oxycodone and tylenol  [place] IV tylenol and perioperative antibiotics  Continue lovenox for DVT prophylaxis.  Encourage Ambulation/ Incentive spirometer and Fluids.  Continues on IVF at 100 ml/hr.                 _____________________________________________      ** This clinical review is compiled from documentation provided by the treatment team in the medical record. **    _____________________________________________    Lauretta Grill) Lucretia Roers, RN, BSN,  BS  Utilization Review Case Manager  Case Management Department  [facility]  [address]  [place], [place] [address]  Phone:  [phone]  Fax:  [phone]  Case Management Main Phone:  [phone]    [facility] Tax ID:  [id number]  NPI:  [id number]    Please use fax number [phone] to provide authorization for hospital services or to request additional information.  ------------------------------------------------------------------------

## 2015-10-23 NOTE — Progress Notes (Signed)
PROGRESS NOTE    Date Time: 10/23/2015 7:33 AM  Patient Name: Amber [name], Amber [name]      Subjective:   Pain is well controlled with IV pain medications.  Minimal nausea, no vomiting.  Ambulating and urinating without difficulty.  Vitals ok, labs ok this am.  Denies chest pain, shortness of breath or calf pain.    Medications:     Current Facility-Administered Medications   Medication Dose Route Frequency   . atenolol  50 mg Oral Daily   . dilTIAZem  60 mg Oral 4 times per day   . enoxaparin  40 mg Subcutaneous QAM   . famotidine  20 mg Intravenous Q12H SCH   . scopolamine  1 patch Transdermal Q72H       Physical Exam:     Filed Vitals:    10/23/15 0349   BP: 104/56   Pulse: 72   Temp: 97.9 F (36.6 C)   Resp: 16   SpO2: 99%       Intake and Output Summary (Last 24 hours) at Date Time    Intake/Output Summary (Last 24 hours) at 10/23/15 1610  Last data filed at 10/23/15 0423   Gross per 24 hour   Intake   1900 ml   Output   1750 ml   Net    150 ml       General appearance - alert, well appearing, and in no distress, oriented to person, place, and time and acyanotic, in no respiratory distress  Mental status - alert, oriented to person, place, and time  Chest - clear to auscultation, no wheezes, rales or rhonchi, symmetric air entry  Heart - normal rate, regular rhythm, normal S1, S2, no murmurs, rubs, clicks or gallops  Abdomen - soft, nontender, nondistended  bowel sounds normal, Incisions intact and appropriately tender  Extremities - peripheral pulses normal, no pedal edema, no clubbing or cyanosis, no edema, redness or tenderness in the calves or thighs    Labs:     Results     Procedure Component Value Units Date/Time    Magnesium (QAM x 1) [[id number]] Collected:  10/23/15 0539    Specimen Information:  Blood Updated:  10/23/15 0630     Magnesium 1.8 mg/dL     Narrative:      If indicated    Phosphorus (QAM x 1) [[id number]] Collected:  10/23/15 0539    Specimen Information:  Blood Updated:  10/23/15 0630      Phosphorus 3.0 mg/dL     Narrative:      If indicated    Creatine Kinase (CK) [[id number]] Collected:  10/23/15 0539    Specimen Information:  Blood Updated:  10/23/15 0630     Creatine Kinase (CK) 127 U/L     Narrative:      If indicated    Basic Metabolic Panel [[id number]]  (Abnormal) Collected:  10/23/15 0539    Specimen Information:  Blood Updated:  10/23/15 0630     Glucose 143 (H) mg/dL      BUN 8 mg/dL      Creatinine 0.8 mg/dL      Calcium 8.3 (L) mg/dL      Sodium 284 mEq/L      Potassium 4.0 mEq/L      Chloride 103 mEq/L      CO2 27 mEq/L      Anion Gap 7.0     Narrative:      If indicated    GFR [[id number]] Collected:  10/23/15 0539  EGFR >60.0 Updated:  10/23/15 0630    Narrative:      If indicated    CBC and differential [[id number]]  (Abnormal) Collected:  10/23/15 0539    Specimen Information:  Blood from Blood Updated:  10/23/15 0558     WBC 10.14 x10 3/uL      Hgb 11.0 (L) g/dL      Hematocrit 40.9 (L) %      Platelets 287 x10 3/uL      RBC 4.23 x10 6/uL      MCV 83.2 fL      MCH 26.0 (L) pg      MCHC 31.3 (L) g/dL      RDW 16 (H) %      MPV 9.3 (L) fL      Neutrophils 79 %      Lymphocytes Automated 10 %      Monocytes 11 %      Eosinophils Automated 0 %      Basophils Automated 0 %      Immature Granulocyte 0 %      Nucleated RBC 0 /100 WBC      Neutrophils Absolute 7.99 x10 3/uL      Abs Lymph Automated 1.00 x10 3/uL      Abs Mono Automated 1.11 x10 3/uL      Abs Eos Automated 0.03 x10 3/uL      Absolute Baso Automated 0.01 x10 3/uL      Absolute Immature Granulocyte 0.02 x10 3/uL     Narrative:      If indicated    Type and Screen [[id number]] Collected:  10/22/15 0634    Specimen Information:  Blood Updated:  10/22/15 0751     ABO Rh A NEG      AB Screen Gel NEG     Narrative:      ?On hold in the OR          Rads:     Radiology Results (24 Hour)     ** No results found for the last 24 hours. **          Assessment:   POD # 1 S/P Laparoscopic Procedure(s):  LAPAROSCOPIC, GASTRIC  BYPASS  INSERTION, PAIN PUMP (MEDICAL)  LAPAROSCOPIC, HERNIORRHAPHY, HIATAL  Doing well    Plan:   UGI this am, if ok, will advance to bariatric clear liquid diet and start PO oxycodone and tylenol  [place]  IV tylenol and perioperative antibiotics  Continue lovenox for DVT prophylaxis.  Encourage Ambulation/ Incentive spirometer and Fluids.  Decrease IVF  Possible [place] later today vs. tomorrow      Signed by: Thomasenia Bottoms, DO

## 2015-10-23 NOTE — Progress Notes (Signed)
Bariatric Nurse Program Coordinator Inpatient Note    Surgery Type: Laparoscopic Gastric Bypass   Post Operative Day #: 1    Patient is experiencing adequate pain control: Yes    Patient encouraged to use incentive spirometer ten times an hour while awake: Yes     Patient to pump feet while in bed to minimize the risk of VTE, wearing compression wraps as ordered: Yes    Patient instructed to ambulate at least 4 times per day on unit. Yes    Patient aware to gradually increase activity upon discharge: Yes    Patient has attended pre-operative education class and/or has watched video and completed quiz. Patient verbalizes understanding of the life style changes after surgery: Yes    Reiterated the need to continue to abstain from alcohol and smoking and potential complications from failure to do so: Yes    Encouraged the patient to attend bariatric support group meetings regularly upon discharge: Yes    Advised to follow up with the surgeon's office as directed: Yes    Notes: Patient alert, discussed On Qpump with patient, questions answered.

## 2015-10-23 NOTE — Discharge Instructions (Signed)
Post Bariatric Surgery Discharge Instructions    For Questions Call Your Surgeon    Reasons to call the doctor:  Call your doctor or go to emergency room if you have any of the following:   Fever greater than 101.5 F   Liquid draining from your wound   Redness around your wound that is spreading   Pain not controlled with medications   Chest pain   Shortness of breath   Leg pain   Severe nausea/vomiting not controlled with medication    Diet:   Sugar Free Clear Liquids/Protein Drinks/Supplements after surgery  Please refer to binder given to you BEFORE surgery for diet discharge instructions.    Activity:   You may walk as much as you like and go up and down stairs.  We ask that you not do any heavy lifting (greater than 20 lbs)    Driving:  You may drive when you are no longer taking narcotic pain medication and you feel mobile enough to turn your body to look behind you safely without causing pain.    Pain Control:  Some pain and swelling is normal after surgery.  You may take the pain medication as prescribed.  You may also ice your incision with an ice pack wrapped in a towel for 20 min as needed.    Bathing:    You may shower 24 hours after surgery.  You should not soak in a bath tub or go swimming for 2 weeks. Your wound is covered with surgical glue.  It is ok to shower with glue in place, let soaping water run over the wound and pat dry.  The glue will fall off on its own.  You may also shower with the OnQ catheter in place as long as the clear dressing is intact.     OnQ CATHETER REMOVAL  Remove catheter as soon as infusion is complete  (ball will be empty approximately 5 days)    1. Remove dressing and loosen the Steri-Strips at catheter site.  2. Grasp catheter close to skin and gently pull to remove. The catheter should be easy to remove and not painful. Do not tug or quickly pull on catheter during removal.   CAUTIONS:  . If resistance is encountered or catheter stretches, STOP. Continued  pulling could break the catheter. It's advisable to wait 30 to 60 minutes and try again.  . Do not cut or forcefully remove catheter.  . After removal, check distal end of catheter for black marking to ensure entire catheter was removed.  3. Cover puncture site with bandaid.  4. Discard catheter

## 2015-10-24 MED ORDER — BISACODYL 10 MG RE SUPP
10.0000 mg | Freq: Once | RECTAL | Status: AC
Start: 2015-10-24 — End: 2015-10-24
  Administered 2015-10-24: 10 mg via RECTAL
  Filled 2015-10-24: qty 1

## 2015-10-24 NOTE — UM Notes (Signed)
PATIENT NAME: SHAMIKIA, LINSKEY   PATIENT DOB: 1950-11-12, 64 y.o./64 y.o., female   DISCHARGE DATE NOTIFICATION   PATIENT DISCHARGED ON: 10/24/2015 11:15 AM TO Home or Self Care     Patrece Tallie M. Barnie Alderman, RN, BSN  Clinical Case Manager - Utilization Review  [facility]   [address]   [place], [place] [address]  [phone] [facility])  [phone] (Fax)    Please use fax number [phone] to provide authorization for hospital services or to request additional information.

## 2015-10-24 NOTE — Discharge Summary (Signed)
PROGRESS NOTE    Date Time: 10/24/2015 8:22 AM  Patient Name: Amber [name], Amber [name]      Subjective:   Feels well, pain is well controlled with PO pain medications, just complains of some gas pain.  Tolerating bariatric clear liquid diet, ambulating and urinating without difficulty. Denies n/v/chest pain, shortness of breath or calf pain.  Feels ready for discharge to home.       Medications:     Current Facility-Administered Medications   Medication Dose Route Frequency   . atenolol  50 mg Oral Daily   . bisacodyl  10 mg Rectal Once   . dilTIAZem  60 mg Oral 4 times per day   . enoxaparin  40 mg Subcutaneous QAM   . famotidine  20 mg Intravenous Q12H SCH   . scopolamine  1 patch Transdermal Q72H       Physical Exam:     Filed Vitals:    10/24/15 0728   BP: 155/76   Pulse: 87   Temp: 99.6 F (37.6 C)   Resp: 16   SpO2: 96%       Intake and Output Summary (Last 24 hours) at Date Time    Intake/Output Summary (Last 24 hours) at 10/24/15 1610  Last data filed at 10/24/15 0000   Gross per 24 hour   Intake      0 ml   Output   1150 ml   Net  -1150 ml       General appearance - alert, well appearing, and in no distress, oriented to person, place, and time and acyanotic, in no respiratory distress  Mental status - alert, oriented to person, place, and time  Chest - clear to auscultation, no wheezes, rales or rhonchi, symmetric air entry  Heart - normal rate, regular rhythm, normal S1, S2, no murmurs, rubs, clicks or gallops  Abdomen - soft, nontender, nondistended, on Q in place  bowel sounds normal, Incisions intact and appropriately tender  Extremities - peripheral pulses normal, no pedal edema, no clubbing or cyanosis, no edema, redness or tenderness in the calves or thighs    Labs:     Results     ** No results found for the last 24 hours. **          Rads:     Radiology Results (24 Hour)     Procedure Component Value Units Date/Time    FL Upper GI with KUB [[id number]] Collected:  10/23/15 1323    Order Status:   Completed Updated:  10/23/15 1327    Narrative:      Clinical History: Gastric Bypass    Findings:  Preliminary abdominal radiograph shows no evidence of  obstruction.  Surgical sutures are noted in the left upper quadrant  region.    Oral contrast was administered, and fluoroscopic spot images and  overhead radiographs were obtained.      The distal esophagus is normal.  There is free flow of contrast into the  gastric pouch.  The pouch is of expected size and appearance.  There is  free flow of contrast through the gastrojejunostomy, and then into the  small bowel.    The distal small bowel loops are nondilated.  There is no extra luminal  extravasation.        Impression:       Expected post operative findings without obstruction or  leak.    Trilby Drummer, MD   10/23/2015 1:23 PM  Final Diagnosis: Morbid Obesity    Assessment:   POD # 2 S/P Procedure(s):  LAPAROSCOPIC, GASTRIC BYPASS  INSERTION, PAIN PUMP (MEDICAL)  LAPAROSCOPIC, HERNIORRHAPHY, HIATAL  Doing well    Plan:   Ok for discharge tohome  Dulcolax supp today  Diet: bariatric liquid diet  Encourage Ambulation/ Incentive spirometer and Fluids  Remove on Q catheter on Sunday or before if issues  Will discharge to home on oxycodone, zofran, PPIs, Lovenox, [place]so if prescribed  Follow up in the office 10-14 days    Signed by: Thomasenia Bottoms, DO

## 2015-10-24 NOTE — Progress Notes (Signed)
Patient discharged prior to CM assessment.

## 2015-10-24 NOTE — Progress Notes (Signed)
Pt discharged home with family. Discharge instructions discussed with pt and family-- both verbalized understanding. Removal of On-Q pump discussed, and information provided. Wheelchair to lobby

## 2015-10-24 NOTE — Plan of Care (Signed)
VSS. Patient ambulating. Voiding. Pain well managed on current pain meds. Patient tolerating bariatric clear diet. Patient eager to go home

## 2015-10-24 NOTE — Plan of Care (Signed)
Problem: Safety  Goal: Patient will be free from injury during hospitalization  Outcome: Progressing  Reinforced call bell use, pt aware. Call bell and sidetable within reach, bed low. Will cont to monitor.        Problem: Day 1 Post-op- Gastric Bypass  Goal: Pain at adequate level as identified by patient  Outcome: Progressing  Pain managed w/ prn oxycodone. Will cont to monitor.   Goal: Mobility/activity is maintained at optimum level for patient  Outcome: Progressing    Comments:   Reinforced IS use. Pt tolerating clears.

## 2015-10-29 NOTE — Telephone Encounter (Signed)
Order sent for dexamethasone

## 2015-10-31 ENCOUNTER — Telehealth (INDEPENDENT_AMBULATORY_CARE_PROVIDER_SITE_OTHER): Payer: Self-pay

## 2015-10-31 NOTE — Telephone Encounter (Signed)
Rep from [facility], called to provide contact information.  Patient enrolled in program with [employer] for re-admission prevention.  Noreene Larsson reports she spoke with patient.  Reports patient said she is not taking Zofran because it is causing nausea.  No longer taking prescribed pain medication to avoid addiction.  Taking Tylenol PRN for pain.  To contact Noreene Larsson with questions or concerns: Tel [phone].

## 2015-11-07 ENCOUNTER — Encounter (INDEPENDENT_AMBULATORY_CARE_PROVIDER_SITE_OTHER): Payer: Self-pay | Admitting: Surgery

## 2015-11-07 ENCOUNTER — Ambulatory Visit (INDEPENDENT_AMBULATORY_CARE_PROVIDER_SITE_OTHER): Payer: Commercial Managed Care - HMO

## 2015-11-07 ENCOUNTER — Ambulatory Visit (INDEPENDENT_AMBULATORY_CARE_PROVIDER_SITE_OTHER): Payer: Commercial Managed Care - HMO | Admitting: Surgery

## 2015-11-07 VITALS — BP 141/84 | HR 76 | Temp 97.7°F | Ht 61.0 in | Wt 203.0 lb

## 2015-11-07 DIAGNOSIS — Z713 Dietary counseling and surveillance: Secondary | ICD-10-CM

## 2015-11-07 DIAGNOSIS — Z9884 Bariatric surgery status: Secondary | ICD-10-CM

## 2015-11-07 DIAGNOSIS — Z6841 Body Mass Index (BMI) 40.0 and over, adult: Secondary | ICD-10-CM

## 2015-11-07 NOTE — Progress Notes (Signed)
Assessment:  Status Post Laparoscopic RNY-Gastric Bypass      Plan:  1.  Diet advance as per dietitian  2.  Patient is to increase exercise.  3.  Patient is to continue with supplements.  4.  Patient is to follow up with PCP as needed.  5.  Patient is to follow up in 6 week(s)          Subjective:  Amber [name] returns for follow up 2 week(s) post laparoscopic RNY-Gastric Bypass. She is doing well with 15 lbs. total weight loss. She is tolerating a liquid diet and denies any nausea, vomiting, reflux or abdominal pain.  She is compliant with liquids and vitamin supplementation. Her energy level is good. She is exercising regularly.      Objective:  The following portions of the patient's history were reviewed and updated as appropriate: allergies, current medications, past family history, past medical history, past social history, past surgical history and problem list.    General appearance: alert, appears stated age and cooperative  Head: Normocephalic, without obvious abnormality, atraumatic  Eyes: negative findings: conjunctivae and sclerae normal  Neck: no adenopathy, no carotid bruit, no JVD, supple, symmetrical, trachea midline and thyroid not enlarged, symmetric, no tenderness/mass/nodules  Lungs: clear to auscultation bilaterally  Heart: regular rate and rhythm, S1, S2 normal, no murmur, click, rub or gallop  Abdomen: soft, non-tender; bowel sounds normal; no masses,  no organomegaly and Incisions are clean, dry, and intact  Extremities: extremities normal, atraumatic, no cyanosis or edema and Homans sign is negative, no sign of DVT      Admission on 10/22/2015, Discharged on 10/24/2015   Component Date Value Ref Range Status   . ABO Rh 10/22/2015 A NEG   Final   . AB Screen Gel 10/22/2015 NEG   Final   . WBC 10/23/2015 10.14  3.50 - 10.80 x10 3/uL Final   . Hgb 10/23/2015 11.0* 12.0 - 16.0 g/dL Final   . Hematocrit 47/42/5956 35.2* 37.0 - 47.0 % Final   . Platelets 10/23/2015 287  140 - 400  x10 3/uL Final   . RBC 10/23/2015 4.23  4.20 - 5.40 x10 6/uL Final   . MCV 10/23/2015 83.2  80.0 - 100.0 fL Final   . MCH 10/23/2015 26.0* 28.0 - 32.0 pg Final   . MCHC 10/23/2015 31.3* 32.0 - 36.0 g/dL Final   . RDW 38/75/6433 16* 12 - 15 % Final   . MPV 10/23/2015 9.3* 9.4 - 12.3 fL Final   . Neutrophils 10/23/2015 79  None % Final   . Lymphocytes Automated 10/23/2015 10  None % Final   . Monocytes 10/23/2015 11  None % Final   . Eosinophils Automated 10/23/2015 0  None % Final   . Basophils Automated 10/23/2015 0  None % Final   . Immature Granulocyte 10/23/2015 0  None % Final   . Nucleated RBC 10/23/2015 0  0 - 1 /100 WBC Final   . Neutrophils Absolute 10/23/2015 7.99  1.80 - 8.10 x10 3/uL Final   . Abs Lymph Automated 10/23/2015 1.00  0.50 - 4.40 x10 3/uL Final   . Abs Mono Automated 10/23/2015 1.11  0.00 - 1.20 x10 3/uL Final   . Abs Eos Automated 10/23/2015 0.03  0.00 - 0.70 x10 3/uL Final   . Absolute Baso Automated 10/23/2015 0.01  0.00 - 0.20 x10 3/uL Final   . Absolute Immature Granulocyte 10/23/2015 0.02  0 x10 3/uL Final   . Magnesium  10/23/2015 1.8  1.6 - 2.6 mg/dL Final   . Phosphorus 16/08/9603 3.0  2.3 - 4.7 mg/dL Final   . Creatine Kinase (CK) 10/23/2015 127  29 - 168 U/L Final   . Glucose 10/23/2015 143* 70 - 100 mg/dL Final    Comment: ADA guidelines for diabetes mellitus:  Fasting:  Equal to or greater than 126 mg/dL  Random:   Equal to or greater than 200 mg/dL     . BUN 10/23/2015 8  7 - 19 mg/dL Final   . Creatinine 54/07/8118 0.8  0.6 - 1.0 mg/dL Final   . Calcium 14/78/2956 8.3* 8.5 - 10.5 mg/dL Final   . Sodium 21/30/8657 137  136 - 145 mEq/L Final   . Potassium 10/23/2015 4.0  3.5 - 5.1 mEq/L Final   . Chloride 10/23/2015 103  100 - 111 mEq/L Final   . CO2 10/23/2015 27  22 - 29 mEq/L Final   . Anion Gap 10/23/2015 7.0  5.0 - 15.0 Final   . EGFR 10/23/2015 >60.0   Final    Comment: Disease State Reference Ranges:    Chronic Kidney Disease; < 60 ml/min/1.73 sq.m    Kidney Failure; < 15  ml/min/1.73 sq.m    [Calculated using IDMS-Traceable MDRD equation (based on    gender, age and black vs. non-black race) recommended by    [employer] Kidney Disease Education Program. No data    available for non-white, non-black race.]  GFR estimates are unreliable in patients with:    Rapidly changing kidney function or recent dialysis,    extreme age, body size or body composition(obesity,    severe malnutrition). Abnormal muscle mass (limb    amputation, muscle wasting). In these patients,    alternative determinations of GFR should be obtained.     Office Visit on 09/25/2015   Component Date Value Ref Range Status   . Vitamin D, 25-OH, Total 09/25/2015 31  30 - 100 ng/mL Final   . Vitamin D, 25-OH, D3 09/25/2015 14   Final   . 25-Hydroxy D2 09/25/2015 17   Final    Comment: 25-OHD3 indicates both endogenous production and  supplementation. 25-OHD2 is an indicator of  exogenous sources such as diet or supplementation.  Therapy is based on measurement of Total 25-OHD,  with levels <20 ng/mL indicative of Vitamin D  deficiency while levels between 20 ng/mL and 30  ng/mL suggest insufficiency. Optimal levels are  > or = 30 ng/mL.  For more information on this test, go to  http://education.[facility].com/faq/FAQ163     . Vitamin A (Retinol) 09/25/2015 44  38 - 98 Final    Comment: Vitamin supplementation within 24 hours prior to  blood draw may affect the accuracy of the results.  This test was developed and its analytical performance  characteristics have been determined by [facility] [place], [place]. It has  not been cleared or approved by the U.S. Food and Drug  Administration. This assay has been validated pursuant  to the CLIA regulations and is used for clinical  purposes.  Test Performed by Clerance Lav,  [facility],  [address], [place], [place] [address]  Si Raider, M.D., Ph.D., Director of Laboratories  [phone], CLIA [id number]     .  Vitamin B1 (Thiamine) 09/25/2015 10  8 - 30 nmol/L Final    Comment: Vitamin supplementation within 24 hours prior to  blood draw may affect the accuracy of the results.  This test was  developed and its analytical performance  characteristics have been determined by [facility] [place], [place]. It has  not been cleared or approved by the U.S. Food and Drug  Administration. This assay has been validated pursuant  to the CLIA regulations and is used for clinical  purposes.  Test Performed by Clerance Lav,  [facility],  [address], [place], [place] [address]  Si Raider, M.D., Ph.D., Director of Laboratories  [phone], CLIA [id number]     . Cholesterol 09/25/2015 255* 0 - 199 mg/dL Final   . Triglycerides 09/25/2015 63  34 - 149 mg/dL Final   . HDL 59/56/3875 64  40 - 9999 mg/dL Final    Comment: HDL:       Less than 40 mg/dL - Major risk heart disease       Greater than or equal to 60 mg/dL - Negative risk factor       for heart disease     . LDL Calculated 09/25/2015 643* 0 - 99 mg/dL Final   . VLDL Cholesterol Cal 09/25/2015 13  10 - 40 mg/dL Final   . CHOL/HDL Ratio 09/25/2015 4.0  See Below Final    Comment: Chol/HDL Ratio:  Classification                   Female     Female  Very Low (1/2 Average Risk)      <3.4     <3.3  Low Risk                         4.0      3.8  Average Risk                     5.0      4.5  Moderate Risk (2X Average risk)  9.5      7.0  High Risk (3X Average Risk)      >23.0    >11.0     . Folate 09/25/2015 13.1  See below ng/mL Final    Comment: Deficient    : <3.5 ng/mL  Intermediate : 3.5 - 5.4 ng/mL  Normal       : Greater Than 5.4 ng/mL     . Vitamin B-12 09/25/2015 475  211 - 911 pg/mL Final   . WBC 09/25/2015 4.23  3.50 - 10.80 x10 3/uL Final   . Hgb 09/25/2015 13.3  12.0 - 16.0 g/dL Final   . Hematocrit 32/95/1884 41.0  37.0 - 47.0 % Final   . Platelets 09/25/2015 276  140 - 400 x10 3/uL Final   . RBC 09/25/2015 4.88   4.20 - 5.40 x10 6/uL Final   . MCV 09/25/2015 84.0  80.0 - 100.0 fL Final   . MCH 09/25/2015 27.3* 28.0 - 32.0 pg Final   . MCHC 09/25/2015 32.4  32.0 - 36.0 g/dL Final   . RDW 16/60/6301 16* 12 - 15 % Final   . MPV 09/25/2015 10.2  9.4 - 12.3 fL Final   . Nucleated RBC 09/25/2015 0  0 - 1 /100 WBC Final   . Glucose 09/25/2015 86  70 - 100 mg/dL Final    Comment: ADA guidelines for diabetes mellitus:  Fasting:  Equal to or greater than 126 mg/dL  Random:   Equal to or greater than 200 mg/dL     . BUN 09/25/2015 11.0  7.0 - 19.0 mg/dL Final   .  Creatinine 09/25/2015 0.8  0.4 - 1.5 mg/dL Final   . Sodium 14/78/2956 142  135 - 146 mEq/L Final   . Potassium 09/25/2015 3.7  3.5 - 5.3 mEq/L Final   . Chloride 09/25/2015 106  100 - 111 mEq/L Final   . CO2 09/25/2015 28  21 - 30 mEq/L Final   . Calcium 09/25/2015 9.8  8.5 - 10.5 mg/dL Final   . Protein, Total 09/25/2015 6.9  6.0 - 8.3 g/dL Final   . Albumin 21/30/8657 3.8  3.5 - 5.0 g/dL Final   . AST (SGOT) 84/69/6295 17  5 - 34 U/L Final   . ALT 09/25/2015 12  0 - 55 U/L Final   . Alkaline Phosphatase 09/25/2015 133* 37 - 106 U/L Final   . Bilirubin, Total 09/25/2015 0.5  0.1 - 1.2 mg/dL Final   . Globulin 28/41/3244 3.1  2.0 - 3.7 g/dL Final   . Albumin/Globulin Ratio 09/25/2015 1.2  0.9 - 2.2 Final   . Hemoglobin A1C 09/25/2015 5.6  4.6 - 5.9 % Final    Comment: HBA1C: Hemoglobin A1c values of 5.7-6.4% indicate an increased  risk for developing diabetes mellitus. Hemoglobin A1c values  greater than or equal to 6.5% are diagnostic of diabetes  mellitus.     . Average Estimated Glucose 09/25/2015 114.0   Final   . Copper 09/25/2015 136  70 - 175 Final    Comment: Test Performed by Clerance Lav,  [facility],  [address], [place], [place] [address]  Si Raider, M.D., Ph.D., Director of Laboratories  [phone], CLIA [id number]     . PTH Intact 09/25/2015 54.0  9.0 - 72.0 pg/mL Final   . Iron 09/25/2015 102  40 - 145 ug/dL Final   .  UIBC 56/43/3295 286  126 - 382 ug/dL Final   . TIBC 18/84/1660 388  265 - 497 ug/dL Final   . Iron Saturation 09/25/2015 26  15 - 50 % Final   . Hemolysis Index 09/25/2015 1  0 - 18 Final   . Thyroid Stimulating Hormone 09/25/2015 0.88  0.35 - 4.94 uIU/mL Final   . EGFR 09/25/2015 >60.0   Final    Comment: Disease State Reference Ranges:    Chronic Kidney Disease; < 60 ml/min/1.73 sq.m    Kidney Failure; < 15 ml/min/1.73 sq.m    [Calculated using IDMS-Traceable MDRD equation (based on    gender, age and black vs. non-black race) recommended by    [employer] Kidney Disease Education Program. No data    available for non-white, non-black race.]  GFR estimates are unreliable in patients with:    Rapidly changing kidney function or recent dialysis,    extreme age, body size or body composition(obesity,    severe malnutrition). Abnormal muscle mass (limb    amputation, muscle wasting). In these patients,    alternative determinations of GFR should be obtained.     Appointment on 09/25/2015   Component Date Value Ref Range Status   . PTT 09/25/2015 29  23 - 37 sec Final    Comment: In vivo therapeutic range of heparin (0.3 - 0.7 IU/mL)  correlate with the following APTT times: 64 - 102 seconds.     . APTT Anticoag. Given w/i 48 hrs. 09/25/2015 Unknown   Final       Fl Upper Gi With Kub    10/23/2015  Clinical History: Gastric Bypass Findings:  Preliminary abdominal radiograph shows no evidence of obstruction.  Surgical sutures are  noted in the left upper quadrant region. Oral contrast was administered, and fluoroscopic spot images and overhead radiographs were obtained.  The distal esophagus is normal.  There is free flow of contrast into the gastric pouch.  The pouch is of expected size and appearance.  There is free flow of contrast through the gastrojejunostomy, and then into the small bowel. The distal small bowel loops are nondilated.  There is no extra luminal extravasation.     10/23/2015   Expected post operative  findings without obstruction or leak. Trilby Drummer, MD 10/23/2015 1:23 PM   .         Fl Upper Gi With Kub    10/23/2015  Clinical History: Gastric Bypass Findings:  Preliminary abdominal radiograph shows no evidence of obstruction.  Surgical sutures are noted in the left upper quadrant region. Oral contrast was administered, and fluoroscopic spot images and overhead radiographs were obtained.  The distal esophagus is normal.  There is free flow of contrast into the gastric pouch.  The pouch is of expected size and appearance.  There is free flow of contrast through the gastrojejunostomy, and then into the small bowel. The distal small bowel loops are nondilated.  There is no extra luminal extravasation.     10/23/2015   Expected post operative findings without obstruction or leak. Trilby Drummer, MD 10/23/2015 1:23 PM       Amber [name] R. Alok Minshall, DO, FACS, FASMBS, FACOS

## 2015-11-07 NOTE — Progress Notes (Signed)
S:  Pt presents for nutrition  f/u after bypass  Procedure (10/22/15).  Pt states tolerating clear  liquid diet  Ok, pt reports she has been in ED twice due to dumping and low potassium.   Pt reports drinking clear fluids daily, uncertain of amount.  Pt reports she has a hard time following 30/30 rule on clear liquids.  Pt reports  consuming about 54 grams protein from supplemental sources daily (BA HPMR). Pt had questions about how to drink.  Pt reports some issues with nausea, denies any issues with C/V/D.  Pt reports taking all the recommended vitamin/mineral supplements at this time.      O:  Today's Wt: 203 pounds   Previous Wts:    Wt Readings from Last 10 Encounters:   10/22/15 210 lb   10/15/15 213 lb 0.6 oz   10/14/15 214 lb   10/11/15 212 lb   09/25/15 216 lb   09/20/15 218 lb 1.3 oz   07/19/15 214 lb   06/24/15 214 lb 0.6 oz   06/13/15 218 lb   05/15/15 213 lb    BMI:  There is no weight on file to calculate BMI.    A:  Pt is 16  days out from surgery and has lost 7 pounds since surgery (10/22/15).  Pt with limited adherence to nutrition related recommendations for stage as evidenced by report, recall, and rate of weight loss (7 pounds since surgery 16 days ago).  Nutrition Intervention Included:  Reviewing 30/30 rule and acceptable fluids/foods with full liquids (do not follow 30/30 rule) and mushies (follow 30/30 rule).  Advanced to full liquids (11/07/15) and then mushies (11/13/15).  Reviewed acceptable foods /fluids and portions (1/4 cup or 2 ounces - half protein half produce).  Pt provided with handout and referred back to pre-op nutrition manual.  Pt advised to self monitor fluid intake and any record issues with dumping syndrome, also advised to contact office. Pt appears to be getting adequate hydration and protein as evidenced by diet recall.  Pt verbalizes comprehension and desired compliance with next diet stage.    P:  1.  Advance diet to full liquids on  11/07/15 and then to soft diet on  11/13/15.  Diet guidelines reviewed and pt verbalized understanding and desired compliance.  2.  F/u in 6 weeks for diet advance to solids.      Spent a total of 15 minutes educating pt in a individual one-on-one setting.

## 2015-12-06 ENCOUNTER — Other Ambulatory Visit (INDEPENDENT_AMBULATORY_CARE_PROVIDER_SITE_OTHER): Payer: Self-pay | Admitting: Internal Medicine

## 2015-12-13 ENCOUNTER — Ambulatory Visit (INDEPENDENT_AMBULATORY_CARE_PROVIDER_SITE_OTHER): Payer: Commercial Managed Care - HMO | Admitting: Internal Medicine

## 2015-12-13 ENCOUNTER — Encounter (INDEPENDENT_AMBULATORY_CARE_PROVIDER_SITE_OTHER): Payer: Self-pay | Admitting: Internal Medicine

## 2015-12-13 VITALS — BP 129/77 | HR 72 | Temp 97.6°F | Resp 16 | Wt 189.6 lb

## 2015-12-13 DIAGNOSIS — D649 Anemia, unspecified: Secondary | ICD-10-CM

## 2015-12-13 DIAGNOSIS — A6000 Herpesviral infection of urogenital system, unspecified: Secondary | ICD-10-CM

## 2015-12-13 DIAGNOSIS — R5383 Other fatigue: Secondary | ICD-10-CM

## 2015-12-13 DIAGNOSIS — Z9884 Bariatric surgery status: Secondary | ICD-10-CM

## 2015-12-13 DIAGNOSIS — I10 Essential (primary) hypertension: Secondary | ICD-10-CM

## 2015-12-13 LAB — BASIC METABOLIC PANEL
BUN: 7 mg/dL (ref 7.0–19.0)
CO2: 29 mEq/L (ref 21–30)
Calcium: 10.2 mg/dL (ref 8.5–10.5)
Chloride: 108 mEq/L (ref 100–111)
Creatinine: 0.7 mg/dL (ref 0.4–1.5)
Glucose: 101 mg/dL — ABNORMAL HIGH (ref 70–100)
Potassium: 4.4 mEq/L (ref 3.5–5.3)
Sodium: 144 mEq/L (ref 135–146)

## 2015-12-13 LAB — CBC AND DIFFERENTIAL
Basophils Absolute Automated: 0.01 10*3/uL (ref 0.00–0.20)
Basophils Automated: 0 %
Eosinophils Absolute Automated: 0.08 10*3/uL (ref 0.00–0.70)
Eosinophils Automated: 1 %
Hematocrit: 41.3 % (ref 37.0–47.0)
Hgb: 13.3 g/dL (ref 12.0–16.0)
Immature Granulocytes Absolute: 0.01 10*3/uL
Immature Granulocytes: 0 %
Lymphocytes Absolute Automated: 1.28 10*3/uL (ref 0.50–4.40)
Lymphocytes Automated: 22 %
MCH: 27.5 pg — ABNORMAL LOW (ref 28.0–32.0)
MCHC: 32.2 g/dL (ref 32.0–36.0)
MCV: 85.3 fL (ref 80.0–100.0)
MPV: 11 fL (ref 9.4–12.3)
Monocytes Absolute Automated: 0.66 10*3/uL (ref 0.00–1.20)
Monocytes: 12 %
Neutrophils Absolute: 3.72 10*3/uL (ref 1.80–8.10)
Neutrophils: 64 %
Nucleated RBC: 0 /100 WBC (ref 0–1)
Platelets: 332 10*3/uL (ref 140–400)
RBC: 4.84 10*6/uL (ref 4.20–5.40)
RDW: 18 % — ABNORMAL HIGH (ref 12–15)
WBC: 5.76 10*3/uL (ref 3.50–10.80)

## 2015-12-13 LAB — IRON PROFILE
Iron Saturation: 25 % (ref 15–50)
Iron: 70 ug/dL (ref 40–145)
TIBC: 285 ug/dL (ref 265–497)
UIBC: 215 ug/dL (ref 126–382)

## 2015-12-13 LAB — GFR: EGFR: >60

## 2015-12-13 LAB — HEMOLYSIS INDEX: Hemolysis Index: 9 (ref 0–18)

## 2015-12-13 LAB — VITAMIN B12: Vitamin B-12: 1605 pg/mL — ABNORMAL HIGH (ref 211–911)

## 2015-12-13 LAB — FOLATE: Folate: 16.1 ng/mL

## 2015-12-13 LAB — FERRITIN: Ferritin: 25.92 ng/mL (ref 4.60–204.00)

## 2015-12-13 MED ORDER — LIDOCAINE HCL 2 % EX GEL
CUTANEOUS | Status: DC | PRN
Start: 2015-12-13 — End: 2020-11-15

## 2015-12-13 NOTE — Progress Notes (Signed)
Subjective:       Patient ID: Amber [name] is a 65 y.o. female.    HPI  Pt is here for follow up   1. Recurrent genital herpes, was off valtrex recently for gastric bypass surgery, resumed med 3d ago  Needs rx for top lidocaine gel to help with pain  2. HTN, stable, home BP 100-110's/60's, no cp, no sob,  3. S/p gastric bypass, taking supplements saw surg 1 month ago. +fatigue. Lost 29lbs since surgery.   4. GERD, stable with med,   5. Anemia,   Lab Results   Component Value Date    HCT 35.2* 10/23/2015        The following portions of the patient's history were reviewed and updated as appropriate: allergies, current medications, past family history, past medical history, past social history, past surgical history and problem list.    Review of Systems   Constitutional: Negative for fever, chills and appetite change.   Respiratory: Negative for shortness of breath.    Cardiovascular: Negative for chest pain and palpitations.   Gastrointestinal: Negative for nausea, vomiting, abdominal pain, diarrhea and constipation.   Genitourinary: Negative for dysuria and frequency.   Neurological: Negative for syncope, light-headedness and headaches.     BP 129/77 mmHg  Pulse 72  Temp(Src) 97.6 F (36.4 C) (Oral)  Resp 16  Wt 86.002 kg (189 lb 9.6 oz)       Objective:    Physical Exam   Constitutional: She appears well-developed. No distress.   obese   HENT:   Mouth/Throat: Oropharynx is clear and moist. No oropharyngeal exudate.   Eyes: Conjunctivae are normal. No scleral icterus.   Neck: Neck supple. No JVD present. Carotid bruit is not present. No thyromegaly present.   Cardiovascular: Normal rate, regular rhythm, normal heart sounds and intact distal pulses.  Exam reveals no gallop and no friction rub.    No murmur heard.  Pulmonary/Chest: Effort normal and breath sounds normal. No respiratory distress. She has no rales.   Abdominal: Soft. Bowel sounds are normal. She exhibits no distension. There is no tenderness.  There is no rebound and no guarding.   Musculoskeletal: She exhibits no edema.   Neurological: She is alert.           Assessment:       1. S/P gastric bypass  Vitamin B12    Folate    Vitamin B1   2. Recurrent genital herpes  lidocaine (XYLOCAINE) 2 % jelly   3. Fatigue, unspecified type     4. Essential hypertension     5. Anemia, unspecified type  CBC and differential    Basic Metabolic Panel    Ferritin    IRON PROFILE           Plan:      Procedures    rx lidocaine 2% gel top   Reduce diltiazem to 60mg  bid.   Monitor BP  F/u surgery as directed  Further recommendations pending test results.  F/u 3 months or sooner prn.

## 2015-12-19 ENCOUNTER — Encounter (INDEPENDENT_AMBULATORY_CARE_PROVIDER_SITE_OTHER): Payer: Self-pay | Admitting: Surgery

## 2015-12-19 ENCOUNTER — Ambulatory Visit (INDEPENDENT_AMBULATORY_CARE_PROVIDER_SITE_OTHER): Payer: Commercial Managed Care - HMO

## 2015-12-19 ENCOUNTER — Encounter (INDEPENDENT_AMBULATORY_CARE_PROVIDER_SITE_OTHER): Payer: Self-pay | Admitting: Internal Medicine

## 2015-12-19 ENCOUNTER — Ambulatory Visit (INDEPENDENT_AMBULATORY_CARE_PROVIDER_SITE_OTHER): Payer: Commercial Managed Care - HMO | Admitting: Surgery

## 2015-12-19 VITALS — BP 137/85 | HR 69 | Temp 97.6°F | Ht 61.0 in | Wt 189.0 lb

## 2015-12-19 DIAGNOSIS — K5901 Slow transit constipation: Secondary | ICD-10-CM

## 2015-12-19 DIAGNOSIS — Z713 Dietary counseling and surveillance: Secondary | ICD-10-CM

## 2015-12-19 DIAGNOSIS — Z9884 Bariatric surgery status: Secondary | ICD-10-CM

## 2015-12-19 DIAGNOSIS — Z6841 Body Mass Index (BMI) 40.0 and over, adult: Secondary | ICD-10-CM

## 2015-12-19 LAB — VITAMIN B1, PLASMA: Vitamin B1 (Thiamine): 23 nmol/L (ref 8–30)

## 2015-12-19 NOTE — Progress Notes (Signed)
Assessment:  Status Post Laparoscopic RNY-Gastric Bypass  Constipation due to dehydration      Plan:  1.  Diet advance as per dietitian.  2.  Patient is to increase exercise.  3.  Patient is to continue with supplements.  I explained to the patient that she needs to drink more water  4.  Patient is to follow up with PCP as needed.  5.  Patient is to follow up in 5 week(s).          Subjective:  [name] returns for follow up 2 month(s) post laparoscopic RNY-Gastric Bypass. She is doing well with 29 lbs. total weight loss. She is tolerating a regular diet and denies any nausea, vomiting, reflux or abdominal pain.  She is compliant with portion control and vitamin supplementation. Her energy level is good. She is exercising regularly.  Lab work was reviewed.  Patient is complaining of constipation.  She is only able to get about 50 ounces of liquids per day.  When she is constipated.  Patient gets crampy abdominal pain.    Objective:  The following portions of the patient's history were reviewed and updated as appropriate: allergies, current medications, past family history, past medical history, past social history, past surgical history and problem list.    General appearance: alert, appears stated age and cooperative  Head: Normocephalic, without obvious abnormality, atraumatic  Eyes: negative findings: conjunctivae and sclerae normal  Neck: no adenopathy, no carotid bruit, no JVD, supple, symmetrical, trachea midline and thyroid not enlarged, symmetric, no tenderness/mass/nodules  Lungs: clear to auscultation bilaterally  Heart: regular rate and rhythm, S1, S2 normal, no murmur, click, rub or gallop  Abdomen: soft, non-tender; bowel sounds normal; no masses,  no organomegaly  Extremities: extremities normal, atraumatic, no cyanosis or edema and Homans sign is negative, no sign of DVT      Office Visit on 12/13/2015   Component Date Value Ref Range Status   . WBC 12/13/2015 5.76  3.50 - 10.80  x10 3/uL Final   . Hgb 12/13/2015 13.3  12.0 - 16.0 g/dL Final   . Hematocrit 16/08/9603 41.3  37.0 - 47.0 % Final   . Platelets 12/13/2015 332  140 - 400 x10 3/uL Final   . RBC 12/13/2015 4.84  4.20 - 5.40 x10 6/uL Final   . MCV 12/13/2015 85.3  80.0 - 100.0 fL Final   . MCH 12/13/2015 27.5* 28.0 - 32.0 pg Final   . MCHC 12/13/2015 32.2  32.0 - 36.0 g/dL Final   . RDW 54/07/8118 18* 12 - 15 % Final   . MPV 12/13/2015 11.0  9.4 - 12.3 fL Final   . Neutrophils 12/13/2015 64  None % Final   . Lymphocytes Automated 12/13/2015 22  None % Final   . Monocytes 12/13/2015 12  None % Final   . Eosinophils Automated 12/13/2015 1  None % Final   . Basophils Automated 12/13/2015 0  None % Final   . Immature Granulocyte 12/13/2015 0  None % Final   . Nucleated RBC 12/13/2015 0  0 - 1 /100 WBC Final   . Neutrophils Absolute 12/13/2015 3.72  1.80 - 8.10 x10 3/uL Final   . Abs Lymph Automated 12/13/2015 1.28  0.50 - 4.40 x10 3/uL Final   . Abs Mono Automated 12/13/2015 0.66  0.00 - 1.20 x10 3/uL Final   . Abs Eos Automated 12/13/2015 0.08  0.00 - 0.70 x10 3/uL Final   . Absolute Baso Automated  12/13/2015 0.01  0.00 - 0.20 x10 3/uL Final   . Absolute Immature Granulocyte 12/13/2015 0.01  0 x10 3/uL Final   . Glucose 12/13/2015 101* 70 - 100 mg/dL Final    Comment: ADA guidelines for diabetes mellitus:  Fasting:  Equal to or greater than 126 mg/dL  Random:   Equal to or greater than 200 mg/dL     . BUN 12/13/2015 7.0  7.0 - 19.0 mg/dL Final   . Creatinine 57/84/6962 0.7  0.4 - 1.5 mg/dL Final   . Calcium 95/28/4132 10.2  8.5 - 10.5 mg/dL Final   . Sodium 44/11/270 144  135 - 146 mEq/L Final   . Potassium 12/13/2015 4.4  3.5 - 5.3 mEq/L Final   . Chloride 12/13/2015 108  100 - 111 mEq/L Final   . CO2 12/13/2015 29  21 - 30 mEq/L Final   . Ferritin 12/13/2015 25.92  4.60 - 204.00 ng/mL Final   . Iron 12/13/2015 70  40 - 145 ug/dL Final   . UIBC 53/66/4403 215  126 - 382 ug/dL Final   . TIBC 47/42/5956 285  265 - 497 ug/dL Final   .  Iron Saturation 12/13/2015 25  15 - 50 % Final   . Vitamin B-12 12/13/2015 1605* 211 - 911 pg/mL Final   . Folate 12/13/2015 16.1  See below ng/mL Final    Comment: Deficient    : <3.5 ng/mL  Intermediate : 3.5 - 5.4 ng/mL  Normal       : Greater Than 5.4 ng/mL     . Vitamin B1 (Thiamine) 12/13/2015 23  8 - 30 nmol/L Final    Comment: Vitamin supplementation within 24 hours prior to  blood draw may affect the accuracy of the results.  This test was developed and its analytical performance  characteristics have been determined by [facility] [place], [place]. It has  not been cleared or approved by the U.S. Food and Drug  Administration. This assay has been validated pursuant  to the CLIA regulations and is used for clinical  purposes.  Test Performed by Clerance Lav,  [facility],  [address] [place], [place] [address]  Si Raider, M.D., Ph.D., Director of Laboratories  [phone], CLIA [id number]     . Hemolysis Index 12/13/2015 9  0 - 18 Final   . EGFR 12/13/2015 >60.0   Final    Comment: Disease State Reference Ranges:    Chronic Kidney Disease; < 60 ml/min/1.73 sq.m    Kidney Failure; < 15 ml/min/1.73 sq.m    [Calculated using IDMS-Traceable MDRD equation (based on    gender, age and black vs. non-black race) recommended by    [employer] Kidney Disease Education Program. No data    available for non-white, non-black race.]  GFR estimates are unreliable in patients with:    Rapidly changing kidney function or recent dialysis,    extreme age, body size or body composition(obesity,    severe malnutrition). Abnormal muscle mass (limb    amputation, muscle wasting). In these patients,    alternative determinations of GFR should be obtained.     Admission on 10/22/2015, Discharged on 10/24/2015   Component Date Value Ref Range Status   . ABO Rh 10/22/2015 A NEG   Final   . AB Screen Gel 10/22/2015 NEG   Final   . WBC 10/23/2015 10.14  3.50 - 10.80 x10 3/uL  Final   . Hgb 10/23/2015 11.0* 12.0 - 16.0 g/dL  Final   . Hematocrit 10/23/2015 35.2* 37.0 - 47.0 % Final   . Platelets 10/23/2015 287  140 - 400 x10 3/uL Final   . RBC 10/23/2015 4.23  4.20 - 5.40 x10 6/uL Final   . MCV 10/23/2015 83.2  80.0 - 100.0 fL Final   . MCH 10/23/2015 26.0* 28.0 - 32.0 pg Final   . MCHC 10/23/2015 31.3* 32.0 - 36.0 g/dL Final   . RDW 16/08/9603 16* 12 - 15 % Final   . MPV 10/23/2015 9.3* 9.4 - 12.3 fL Final   . Neutrophils 10/23/2015 79  None % Final   . Lymphocytes Automated 10/23/2015 10  None % Final   . Monocytes 10/23/2015 11  None % Final   . Eosinophils Automated 10/23/2015 0  None % Final   . Basophils Automated 10/23/2015 0  None % Final   . Immature Granulocyte 10/23/2015 0  None % Final   . Nucleated RBC 10/23/2015 0  0 - 1 /100 WBC Final   . Neutrophils Absolute 10/23/2015 7.99  1.80 - 8.10 x10 3/uL Final   . Abs Lymph Automated 10/23/2015 1.00  0.50 - 4.40 x10 3/uL Final   . Abs Mono Automated 10/23/2015 1.11  0.00 - 1.20 x10 3/uL Final   . Abs Eos Automated 10/23/2015 0.03  0.00 - 0.70 x10 3/uL Final   . Absolute Baso Automated 10/23/2015 0.01  0.00 - 0.20 x10 3/uL Final   . Absolute Immature Granulocyte 10/23/2015 0.02  0 x10 3/uL Final   . Magnesium 10/23/2015 1.8  1.6 - 2.6 mg/dL Final   . Phosphorus 54/07/8118 3.0  2.3 - 4.7 mg/dL Final   . Creatine Kinase (CK) 10/23/2015 127  29 - 168 U/L Final   . Glucose 10/23/2015 143* 70 - 100 mg/dL Final    Comment: ADA guidelines for diabetes mellitus:  Fasting:  Equal to or greater than 126 mg/dL  Random:   Equal to or greater than 200 mg/dL     . BUN 10/23/2015 8  7 - 19 mg/dL Final   . Creatinine 14/78/2956 0.8  0.6 - 1.0 mg/dL Final   . Calcium 21/30/8657 8.3* 8.5 - 10.5 mg/dL Final   . Sodium 84/69/6295 137  136 - 145 mEq/L Final   . Potassium 10/23/2015 4.0  3.5 - 5.1 mEq/L Final   . Chloride 10/23/2015 103  100 - 111 mEq/L Final   . CO2 10/23/2015 27  22 - 29 mEq/L Final   . Anion Gap 10/23/2015 7.0  5.0 - 15.0 Final   .  EGFR 10/23/2015 >60.0   Final    Comment: Disease State Reference Ranges:    Chronic Kidney Disease; < 60 ml/min/1.73 sq.m    Kidney Failure; < 15 ml/min/1.73 sq.m    [Calculated using IDMS-Traceable MDRD equation (based on    gender, age and black vs. non-black race) recommended by    [employer] Kidney Disease Education Program. No data    available for non-white, non-black race.]  GFR estimates are unreliable in patients with:    Rapidly changing kidney function or recent dialysis,    extreme age, body size or body composition(obesity,    severe malnutrition). Abnormal muscle mass (limb    amputation, muscle wasting). In these patients,    alternative determinations of GFR should be obtained.     Office Visit on 09/25/2015   Component Date Value Ref Range Status   . Vitamin D, 25-OH, Total 09/25/2015 31  30 - 100 ng/mL Final   .  Vitamin D, 25-OH, D3 09/25/2015 14   Final   . 25-Hydroxy D2 09/25/2015 17   Final    Comment: 25-OHD3 indicates both endogenous production and  supplementation. 25-OHD2 is an indicator of  exogenous sources such as diet or supplementation.  Therapy is based on measurement of Total 25-OHD,  with levels <20 ng/mL indicative of Vitamin D  deficiency while levels between 20 ng/mL and 30  ng/mL suggest insufficiency. Optimal levels are  > or = 30 ng/mL.  For more information on this test, go to  http://education.[facility].com/faq/FAQ163     . Vitamin A (Retinol) 09/25/2015 44  38 - 98 Final    Comment: Vitamin supplementation within 24 hours prior to  blood draw may affect the accuracy of the results.  This test was developed and its analytical performance  characteristics have been determined by [facility] [place], [place]. It has  not been cleared or approved by the U.S. Food and Drug  Administration. This assay has been validated pursuant  to the CLIA regulations and is used for clinical  purposes.  Test Performed by Clerance Lav,  [facility],  [address], [place], [place] [address]  Si Raider, M.D., Ph.D., Director of Laboratories  [phone], CLIA [id number]     . Vitamin B1 (Thiamine) 09/25/2015 10  8 - 30 nmol/L Final    Comment: Vitamin supplementation within 24 hours prior to  blood draw may affect the accuracy of the results.  This test was developed and its analytical performance  characteristics have been determined by [facility] [place], [place]. It has  not been cleared or approved by the U.S. Food and Drug  Administration. This assay has been validated pursuant  to the CLIA regulations and is used for clinical  purposes.  Test Performed by Clerance Lav,  [facility],  [address] [place], [place] [address]  Si Raider, M.D., Ph.D., Director of Laboratories  [phone], CLIA [id number]     . Cholesterol 09/25/2015 255* 0 - 199 mg/dL Final   . Triglycerides 09/25/2015 63  34 - 149 mg/dL Final   . HDL 74/25/9563 64  40 - 9999 mg/dL Final    Comment: HDL:       Less than 40 mg/dL - Major risk heart disease       Greater than or equal to 60 mg/dL - Negative risk factor       for heart disease     . LDL Calculated 09/25/2015 875* 0 - 99 mg/dL Final   . VLDL Cholesterol Cal 09/25/2015 13  10 - 40 mg/dL Final   . CHOL/HDL Ratio 09/25/2015 4.0  See Below Final    Comment: Chol/HDL Ratio:  Classification                   Female     Female  Very Low (1/2 Average Risk)      <3.4     <3.3  Low Risk                         4.0      3.8  Average Risk                     5.0      4.5  Moderate Risk (2X Average risk)  9.5      7.0  High Risk (3X Average Risk)      >  23.0    >11.0     . Folate 09/25/2015 13.1  See below ng/mL Final    Comment: Deficient    : <3.5 ng/mL  Intermediate : 3.5 - 5.4 ng/mL  Normal       : Greater Than 5.4 ng/mL     . Vitamin B-12 09/25/2015 475  211 - 911 pg/mL Final   . WBC 09/25/2015 4.23  3.50 - 10.80 x10 3/uL Final   . Hgb 09/25/2015 13.3  12.0 -  16.0 g/dL Final   . Hematocrit 54/07/8118 41.0  37.0 - 47.0 % Final   . Platelets 09/25/2015 276  140 - 400 x10 3/uL Final   . RBC 09/25/2015 4.88  4.20 - 5.40 x10 6/uL Final   . MCV 09/25/2015 84.0  80.0 - 100.0 fL Final   . MCH 09/25/2015 27.3* 28.0 - 32.0 pg Final   . MCHC 09/25/2015 32.4  32.0 - 36.0 g/dL Final   . RDW 14/78/2956 16* 12 - 15 % Final   . MPV 09/25/2015 10.2  9.4 - 12.3 fL Final   . Nucleated RBC 09/25/2015 0  0 - 1 /100 WBC Final   . Glucose 09/25/2015 86  70 - 100 mg/dL Final    Comment: ADA guidelines for diabetes mellitus:  Fasting:  Equal to or greater than 126 mg/dL  Random:   Equal to or greater than 200 mg/dL     . BUN 09/25/2015 11.0  7.0 - 19.0 mg/dL Final   . Creatinine 21/30/8657 0.8  0.4 - 1.5 mg/dL Final   . Sodium 84/69/6295 142  135 - 146 mEq/L Final   . Potassium 09/25/2015 3.7  3.5 - 5.3 mEq/L Final   . Chloride 09/25/2015 106  100 - 111 mEq/L Final   . CO2 09/25/2015 28  21 - 30 mEq/L Final   . Calcium 09/25/2015 9.8  8.5 - 10.5 mg/dL Final   . Protein, Total 09/25/2015 6.9  6.0 - 8.3 g/dL Final   . Albumin 28/41/3244 3.8  3.5 - 5.0 g/dL Final   . AST (SGOT) 11/04/7251 17  5 - 34 U/L Final   . ALT 09/25/2015 12  0 - 55 U/L Final   . Alkaline Phosphatase 09/25/2015 133* 37 - 106 U/L Final   . Bilirubin, Total 09/25/2015 0.5  0.1 - 1.2 mg/dL Final   . Globulin 66/44/0347 3.1  2.0 - 3.7 g/dL Final   . Albumin/Globulin Ratio 09/25/2015 1.2  0.9 - 2.2 Final   . Hemoglobin A1C 09/25/2015 5.6  4.6 - 5.9 % Final    Comment: HBA1C: Hemoglobin A1c values of 5.7-6.4% indicate an increased  risk for developing diabetes mellitus. Hemoglobin A1c values  greater than or equal to 6.5% are diagnostic of diabetes  mellitus.     . Average Estimated Glucose 09/25/2015 114.0   Final   . Copper 09/25/2015 136  70 - 175 Final    Comment: Test Performed by Clerance Lav,  [facility],  [address] [place], [place] [address]  Si Raider, M.D., Ph.D., Director of  Laboratories  [phone], CLIA [id number]     . PTH Intact 09/25/2015 54.0  9.0 - 72.0 pg/mL Final   . Iron 09/25/2015 102  40 - 145 ug/dL Final   . UIBC 63/11/6008 286  126 - 382 ug/dL Final   . TIBC 93/23/5573 388  265 - 497 ug/dL Final   . Iron Saturation 09/25/2015 26  15 - 50 % Final   .  Hemolysis Index 09/25/2015 1  0 - 18 Final   . Thyroid Stimulating Hormone 09/25/2015 0.88  0.35 - 4.94 uIU/mL Final   . EGFR 09/25/2015 >60.0   Final    Comment: Disease State Reference Ranges:    Chronic Kidney Disease; < 60 ml/min/1.73 sq.m    Kidney Failure; < 15 ml/min/1.73 sq.m    [Calculated using IDMS-Traceable MDRD equation (based on    gender, age and black vs. non-black race) recommended by    [employer] Kid[employer] Disease Education Program. No data    available for non-white, non-black race.]  GFR estimates are unreliable in patients with:    Rapidly changing kidney function or recent dialysis,    extreme age, body size or body composition(obesity,    severe malnutrition). Abnormal muscle mass (limb    amputation, muscle wasting). In these patients,    alternative determinations of GFR should be obtained.     Appointment on 09/25/2015   Component Date Value Ref Range Status   . PTT 09/25/2015 29  23 - 37 sec Final    Comment: In vivo therapeutic range of heparin (0.3 - 0.7 IU/mL)  correlate with the following APTT times: 64 - 102 seconds.     . APTT Anticoag. Given w/i 48 hrs. 09/25/2015 Unknown   Final       No results found..         No results found.    Amira Podolak R. Rueben Kassim, DO, FACS, FASMBS, FACOS

## 2015-12-19 NOTE — Progress Notes (Signed)
S:  Pt presents for 8 week f/u visit after bypass surgery.  Pt states tolerating soft diet phase well and reports following nutrition related recommended guidelines: Measuring portions size and reports portion size 4 tablespoons/meal; following 30/30 rule; eating routine meals (not skipping), and taking  all recommended vitamin/mineral supplements at this time.  Pt also reports consuming 54 grams protein daily from a supplemental source (BA HPMR).  Pt reports being able to consume about 48-50 oz clear fluids daily.   Pt states currently participating in the following activities for exercise: walking.    Pt reports some issues with constipation at this time.    O:  Today's Wt: see below   Previous Wt:   Wt Readings from Last 10 Encounters:   12/13/15 189 lb 9.6 oz   11/07/15 203 lb 0.6 oz   10/22/15 210 lb   10/15/15 213 lb 0.6 oz   10/14/15 214 lb   10/11/15 212 lb   09/25/15 216 lb   09/20/15 218 lb 1.3 oz   07/19/15 214 lb   06/24/15 214 lb 0.6 oz      Total Wt Loss:  29 pounds  BMI:  35.74  Weight loss from previous visit: 14 pounds from 6 weeks ago (2.3 pounds/week)    Allergy:    Allergies   Allergen Reactions   . Acyclovir Nausea And Vomiting   . Amoxicillin-Pot Clavulanate      Other reaction(s): gi distress   . Aspirin Nausea And Vomiting     Other reaction(s): gi distress  Upsets stomach  Able to tolerate enteric coated aspirin   . Atorvastatin      Palpitation   . Lovastatin Nausea And Vomiting   . Metronidazole Nausea And Vomiting   . Moxifloxacin Nausea And Vomiting     GI symptoms   . Other Nausea And Vomiting   . Rosuvastatin      Palpitation, chest pain, abd pain    . Statins    . Sulfa Antibiotics Nausea And Vomiting     Other reaction(s): gi distress       Diet Recall:     Breakfast: 4 tablespoons oatmeal   Lunch: 2 tablespoons tuna and 2 tablespoons sweet potaotes   Dinner:  2 tablespoons tuna and 2 tablespoons sweet potatoes   Snacks:   Beverages: water, vitamin water, G2      A:  Pt has lost a  satisfactory amount of weight at this time.  Per diet recall, pt portion sizes at meal times are adequate and food choices are appropriate - high in lean protein and produce.  Pt is drinking adequate amounts of clear fluids daily.  Pt is progressing well with physical activity.  Pt verbalizes comprehension and desired compliance with solid diet phase.    P:  1.  Pt to advanced diet, as tolerated, to solids.  Reviewed solid diet guidelines and meal/food options reviewed.  2.  Advance activity as tolerated.  3.  Pt to f/u in 4 months or PRN.  Contact information provided for pt in case of questions or concerns.    Spent a total of 15 minutes educating pt in a individual one-on-one setting.

## 2015-12-26 ENCOUNTER — Ambulatory Visit (INDEPENDENT_AMBULATORY_CARE_PROVIDER_SITE_OTHER): Payer: Commercial Managed Care - HMO | Admitting: Cardiology

## 2016-01-02 ENCOUNTER — Emergency Department: Payer: Commercial Managed Care - HMO

## 2016-01-02 ENCOUNTER — Encounter (INDEPENDENT_AMBULATORY_CARE_PROVIDER_SITE_OTHER): Payer: Self-pay | Admitting: Internal Medicine

## 2016-01-02 ENCOUNTER — Emergency Department
Admission: EM | Admit: 2016-01-02 | Discharge: 2016-01-02 | Disposition: A | Discharge status: Home / Self Care | Payer: Commercial Managed Care - HMO | Attending: Emergency Medicine | Admitting: Emergency Medicine

## 2016-01-02 ENCOUNTER — Ambulatory Visit (INDEPENDENT_AMBULATORY_CARE_PROVIDER_SITE_OTHER): Payer: Commercial Managed Care - HMO | Admitting: Internal Medicine

## 2016-01-02 VITALS — BP 139/87 | HR 71 | Temp 98.2°F | Resp 16 | Wt 186.4 lb

## 2016-01-02 DIAGNOSIS — G4733 Obstructive sleep apnea (adult) (pediatric): Secondary | ICD-10-CM | POA: Insufficient documentation

## 2016-01-02 DIAGNOSIS — K5900 Constipation, unspecified: Secondary | ICD-10-CM

## 2016-01-02 DIAGNOSIS — Z8673 Personal history of transient ischemic attack (TIA), and cerebral infarction without residual deficits: Secondary | ICD-10-CM | POA: Insufficient documentation

## 2016-01-02 DIAGNOSIS — Z6841 Body Mass Index (BMI) 40.0 and over, adult: Secondary | ICD-10-CM | POA: Insufficient documentation

## 2016-01-02 DIAGNOSIS — K219 Gastro-esophageal reflux disease without esophagitis: Secondary | ICD-10-CM | POA: Insufficient documentation

## 2016-01-02 DIAGNOSIS — I10 Essential (primary) hypertension: Secondary | ICD-10-CM | POA: Insufficient documentation

## 2016-01-02 DIAGNOSIS — R1084 Generalized abdominal pain: Secondary | ICD-10-CM | POA: Insufficient documentation

## 2016-01-02 DIAGNOSIS — Z7982 Long term (current) use of aspirin: Secondary | ICD-10-CM | POA: Insufficient documentation

## 2016-01-02 DIAGNOSIS — Z9884 Bariatric surgery status: Secondary | ICD-10-CM

## 2016-01-02 DIAGNOSIS — E785 Hyperlipidemia, unspecified: Secondary | ICD-10-CM | POA: Insufficient documentation

## 2016-01-02 LAB — CBC AND DIFFERENTIAL
Basophils Absolute Automated: 0.03 10*3/uL (ref 0.00–0.20)
Basophils Automated: 0 %
Eosinophils Absolute Automated: 0.05 10*3/uL (ref 0.00–0.70)
Eosinophils Automated: 1 %
Hematocrit: 43.1 % (ref 37.0–47.0)
Hgb: 14 g/dL (ref 12.0–16.0)
Lymphocytes Absolute Automated: 1.79 10*3/uL (ref 0.50–4.40)
Lymphocytes Automated: 31 %
MCH: 26.8 pg — ABNORMAL LOW (ref 28.0–32.0)
MCHC: 32.5 g/dL (ref 32.0–36.0)
MCV: 82.4 fL (ref 80.0–100.0)
MPV: 9.5 fL (ref 9.4–12.3)
Monocytes Absolute Automated: 0.64 10*3/uL (ref 0.00–1.20)
Monocytes: 11 %
Neutrophils Absolute: 3.34 10*3/uL (ref 1.80–8.10)
Neutrophils: 57 %
Platelets: 304 10*3/uL (ref 140–400)
RBC: 5.23 10*6/uL (ref 4.20–5.40)
RDW: 19 % — ABNORMAL HIGH (ref 12–15)
WBC: 5.85 10*3/uL (ref 3.50–10.80)

## 2016-01-02 LAB — URINALYSIS
Bilirubin, UA: NEGATIVE
Blood, UA: NEGATIVE
Glucose, UA: NEGATIVE
Ketones UA: 25 — AB
Leukocyte Esterase, UA: NEGATIVE
Nitrite, UA: NEGATIVE
Protein, UR: NEGATIVE
Specific Gravity UA: 1.005 (ref 1.001–1.035)
Urine pH: 6 (ref 5.0–8.0)
Urobilinogen, UA: <2 mg/dL (ref 0.2–2.0)

## 2016-01-02 LAB — COMPREHENSIVE METABOLIC PANEL
ALT: 16 U/L (ref 0–55)
AST (SGOT): 20 U/L (ref 5–34)
Albumin/Globulin Ratio: 1.4 (ref 0.9–2.2)
Albumin: 4.2 g/dL (ref 3.5–5.0)
Alkaline Phosphatase: 115 U/L — ABNORMAL HIGH (ref 37–106)
Anion Gap: 16 — ABNORMAL HIGH (ref 5.0–15.0)
BUN: 8 mg/dL (ref 7.0–19.0)
Bilirubin, Total: 0.5 mg/dL (ref 0.2–1.2)
CO2: 24 mEq/L (ref 22–29)
Calcium: 10.1 mg/dL (ref 8.5–10.5)
Chloride: 102 mEq/L (ref 100–111)
Creatinine: 0.7 mg/dL (ref 0.6–1.0)
Globulin: 2.9 g/dL (ref 2.0–3.6)
Glucose: 84 mg/dL (ref 70–100)
Potassium: 3.6 mEq/L (ref 3.5–5.1)
Protein, Total: 7.1 g/dL (ref 6.0–8.3)
Sodium: 142 mEq/L (ref 136–145)

## 2016-01-02 LAB — GFR: EGFR: >60

## 2016-01-02 MED ORDER — POLYETHYLENE GLYCOL 3350 17 G PO PACK
17.0000 g | PACK | Freq: Every day | ORAL | Status: DC | PRN
Start: 2016-01-02 — End: 2017-04-03

## 2016-01-02 MED ORDER — ONDANSETRON HCL 4 MG/2ML IJ SOLN
4.0000 mg | Freq: Once | INTRAMUSCULAR | Status: AC
Start: 2016-01-02 — End: 2016-01-02
  Administered 2016-01-02: 4 mg via INTRAVENOUS
  Filled 2016-01-02: qty 2

## 2016-01-02 MED ORDER — DOCUSATE SODIUM 100 MG PO CAPS
100.0000 mg | ORAL_CAPSULE | Freq: Two times a day (BID) | ORAL | Status: AC
Start: 2016-01-02 — End: 2016-01-16

## 2016-01-02 MED ORDER — SENNA 8.6 MG PO TABS
8.6000 mg | ORAL_TABLET | Freq: Every evening | ORAL | Status: DC
Start: 2016-01-02 — End: 2016-04-24

## 2016-01-02 MED ORDER — IOHEXOL 350 MG/ML IV SOLN
100.0000 mL | Freq: Once | INTRAVENOUS | Status: AC | PRN
Start: 2016-01-02 — End: 2016-01-02

## 2016-01-02 MED ORDER — IOHEXOL 350 MG/ML IV SOLN
INTRAVENOUS | Status: AC
Start: 2016-01-02 — End: 2016-01-02
  Administered 2016-01-02: 100 mL via INTRAVENOUS
  Filled 2016-01-02: qty 100

## 2016-01-02 MED ORDER — SODIUM CHLORIDE 0.9 % IV BOLUS
500.0000 mL | Freq: Once | INTRAVENOUS | Status: AC
Start: 2016-01-02 — End: 2016-01-02
  Administered 2016-01-02: 500 mL via INTRAVENOUS

## 2016-01-02 NOTE — Progress Notes (Signed)
1. Have you self referred yourself since we last saw you? no  Refer to care team   Or   Add specialists:

## 2016-01-02 NOTE — Progress Notes (Signed)
Subjective:       Patient ID: Amber [name] is a 65 y.o. female.    HPI  Pt c/o  Constipation x 1 week, +abd pain, +cramps, +nausea, no vomiting, no fever, no bleeding  Appetite ok,   Went to ED last week, no xray done, labs, EKG ok,  Taking otc miralax, but not effective,   The following portions of the patient's history were reviewed and updated as appropriate: allergies, current medications, past family history, past medical history, past social history, past surgical history and problem list.    Review of Systems   Constitutional: Negative for fever, chills and appetite change.   Respiratory: Negative for shortness of breath.    Cardiovascular: Negative for chest pain.   Gastrointestinal: Positive for nausea, abdominal pain and constipation. Negative for vomiting, diarrhea and blood in stool.   Genitourinary: Negative for dysuria.   Neurological: Positive for dizziness. Negative for syncope.     BP 139/87 mmHg  Pulse 71  Temp(Src) 98.2 F (36.8 C) (Oral)  Resp 16  Wt 84.55 kg (186 lb 6.4 oz)       Objective:    Physical Exam   Constitutional: She appears well-developed. No distress.   obese   HENT:   Mouth/Throat: Oropharynx is clear and moist. No oropharyngeal exudate.   Eyes: Conjunctivae are normal. No scleral icterus.   Neck: Neck supple. No JVD present. Carotid bruit is not present. No thyromegaly present.   Cardiovascular: Normal rate, regular rhythm and normal heart sounds.  Exam reveals no gallop and no friction rub.    No murmur heard.  Pulmonary/Chest: Effort normal and breath sounds normal. No respiratory distress. She has no rales.   Abdominal: Soft. She exhibits no distension. Bowel sounds are increased. There is tenderness. There is no rebound and no guarding.   Musculoskeletal: She exhibits no edema.   Neurological: She is alert.           Assessment:       1. Constipation, unspecified constipation type     2. Generalized abdominal pain     3. S/P gastric bypass             Plan:       Procedures    Pt to go to ED for further evaluation r/o SBO  F/u with surg, Dr. Jeanell Sparrow

## 2016-01-02 NOTE — ED Notes (Signed)
Pt C/O constipation, nausea and abdominal pain. Pt states has not a good BM in over 2 weeks. Pt states her BM are "small pellets".

## 2016-01-02 NOTE — Discharge Instructions (Signed)
You had a CT scan completed in the ER today. Please continue to take medications as prescribed. Follow-up with your primary care physician.     Constipation    You have been diagnosed with constipation.    Constipation is one of the most common problems people have. It is especially common for those over age 64.    Constipation is caused by a few things. This includes not enough fiber in the diet. It also includes not enough regular exercise. Hypothyroidism is another possible cause. Medicines (especially narcotic pain medicines like oxycodone (Percocet), hydrocodone (Vicodin), propoxyphene (Darvocet), and codeine (Tylenol #3) can cause it. Rarely, constipation can be a symptom in colon cancer. You should follow up with your doctor.    Typical constipation symptoms are less bowel movements than usual and small hard stool. Sometimes, diarrhea happens when stool leaks over and around a blockage of hard stool. This is called "overflow incontinence."    There are many treatments for constipation. These include having more fiber in your diet. Fruits, vegetables and foods with bran are rich in fiber. One cup of bran cereal a day usually gets you the daily dietary fiber and fluids you need. A teaspoon (5 ml) of Metamucil with each meal usually works as well.    You may also need suppositories, enemas and/or pills for your constipation. Use these only after talking about them with your doctor.    YOU SHOULD SEEK MEDICAL ATTENTION IMMEDIATELY, EITHER HERE OR AT THE NEAREST EMERGENCY DEPARTMENT, IF ANY OF THE FOLLOWING OCCURS:   Vomiting.   Fever (temperature higher than 100.49F / 38C).   Bloody or black, tarry stool.   Abdominal (belly) pain that is new, worse or that does not get better in the next 24 hours.   Cannot have a bowel movement even with laxatives, enemas and/or suppositories used as directed.   Worsening in any way or any concerns.

## 2016-01-02 NOTE — ED Provider Notes (Signed)
EMERGENCY DEPARTMENT NOTE    Physician/Midlevel provider first contact with patient: 01/02/16 1645         HISTORY OF PRESENT ILLNESS   Historian:Patient  Translator Used: No    Chief Complaint: Abdominal Pain and Constipation       65 y.o. female hx of GERD, HPL, gastric bypass, who presents with abdominal pain and constipation. Pt reports symptoms ongoing for the past week. Pt reports she has had trouble with bowel movements. Has been taking miralax for the past 6 days. Reports one small bowel movements approximately 4 days ago. Reports intermittent cramps. Nausea, however no vomiting.     Pt was seen earlier today by primary care physician and sent to ER to r/o SBO      1. Location of symptoms: R periumbilical area  2. Onset of symptoms: 1 week   3. What was patient doing when symptoms started (Context): see above  4. Severity: moderate  5. Timing: constant  6. Activities that worsen symptoms: attempting defecation   7. Activities that improve symptoms: at rest   8. Quality: cramping   9. Radiation of symptoms: no  10. Associated signs and Symptoms: see above  11. Are symptoms worsening? yes  MEDICAL HISTORY     Past Medical History:  Past Medical History   Diagnosis Date   . GERD (gastroesophageal reflux disease)    . Hiatal hernia    . Genital herpes      no current outbreak (10/03/15)   . Hyperlipidemia    . Osteoarthritis of knees, bilateral    . Nausea without vomiting    . OSA on CPAP 2015     CPAP nightly x 1 yr   . Difficulty walking      amb with cane secondary to arthritis bil knees   . Morbid obesity with BMI of 40.0-44.9, adult      BMI 40.9   . TIA (transient ischemic attack) 2014     TIA 2014-no residual. Patient evaulated for possible TIA 07/12/2015  @ [facility] (dischage summary in epic)-per summary CT of head was normal and patient was d/c'd   . Hypertensive disorder      Well controlled on med. Denies any SOB in last 6 months (10/03/15)   . Chest pain 2016      Chest pain after eating x6   months--being followed by GI for GERD       Past Surgical History:  Past Surgical History   Procedure Laterality Date   . Ovary surgery Right >25 yrs   . Fallopian tube surgery  >25 yrs   . Laparoscopic, cholecystectomy, cholangiogram  08/13/2014   . Appendectomy  >25 yrs   . Spine surgery  11/2013     cervical HNP x 2, Dr. Bufford Buttner   . Egd  01/2015   . Laparoscopic, gastric bypass N/A 10/22/2015     Procedure: LAPAROSCOPIC, GASTRIC BYPASS;  Surgeon: Josefa Half R, DO;  Location: [facility];  Service: General;  Laterality: N/A;   . Insertion, pain pump (medical) N/A 10/22/2015     Procedure: INSERTION, PAIN PUMP (MEDICAL);  Surgeon: Nicola Police, DO;  Location: [facility];  Service: General;  Laterality: N/A;   . Laparoscopic, herniorrhaphy, hiatal N/A 10/22/2015     Procedure: LAPAROSCOPIC, HERNIORRHAPHY, HIATAL;  Surgeon: Nicola Police, DO;  Location: [facility];  Service: General;  Laterality: N/A;       Social History:  Social History  Social History   . Marital Status: Single     Spouse Name: N/A   . Number of Children: 2   . 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 Smoking status: Never Smoker    . Smokeless tobacco: Never Used   . Alcohol Use: No   . Drug Use: No   . Sexual Activity: No     Other Topics Concern   . Dietary Supplements / Vitamins Yes   . Anesthesia Problems No   . Blood Thinners No   . Eats Large Amounts No   . Excessive Sweets No   . Skips Meals No   . Eats Excessive Starches Yes   . Snacks Or Grazes No   . Emotional Eater Yes   . Eats Fried Food Yes   . Eats Fast Food Yes   . Diet Center No   . Hmr No   . [name] No   . La Weight Loss No   . Nutri-System No   . Opti-Fast / Medi-Fast No   . Overeaters Anonymous No   . Physicians Weight Loss Center No   . Tops No   . Weight Watchers No   . Atkins No   . Binging / Purging No   . Body For Life No   . Cabbage Soup No   . Calorie  Counting No   . Fasting No   . [name] No   . Health Spa No   . Herbal Life No   . High Protein No   . Low Carb No   . Low Fat No   . [facility] Diet No   . [place] Diet No   . Margie Billet Diet No   . [place] Diet No   . Slim Fast No   . [place] No   . Sugar Busters No   . Vomiting No   . Zone Diet No   . Stationary Cycle Or Treadmill No   . Gym/Fitness Classes No   . Home Exercise/Video No   . Swimming No   . Team Sports No   . Weight Training No   . Walking Or Running No   . Hospitalization No   . Hypnosis No   . Physical Therapy No   . Psychological Therapy No   . Residential Program No   . Acutrim No   . Amphetamines No   . Anorex No   . Byetta No   . Dexatrim No   . Didrex No   . Fastin No   . Fen - Phen No   . Ionamin / Adipex No   . Mazanor No   . Meridia No   . Obalan No   . Phendiet No   . Phentrol No   . Phenteramine Yes   . Plegine No   . Pondimin No   . Qsymia No   . Prozac No   . Redux No   . Sanorex No   . Ten[place] No   . Tepanole No   . Wechless No   . Wellbutrin No   . Xenical (Orlistat, Alli) No   . Other Med No   . No Impairment Yes     Chronic Bilat Knee Pain   . Walks With Cane/Crutch Yes   . Requires A Wheelchair No   . Bedridden No   .  Are You Currently Being Treated For Depression? No   . Do You Snore? Yes   . Are You Receiving Any Medical Or Psychological Services? No   . Do You Have Or Have You Been Treated For An Eating Disorder? No   . Do You Exercise Regularly? No   . Have You Or Family Member Ever Have Trouble With Anesthesia? No     Social History Narrative       Family History:  Family History   Problem Relation Age of Onset   . Cancer Mother 64     breast cancer       Outpatient Medication:  Previous Medications    ALBUTEROL (PROVENTIL HFA;VENTOLIN HFA) 108 (90 BASE) MCG/ACT INHALER    Inhale into the lungs every 4 (four) hours as needed.       ASPIRIN EC 81 MG EC TABLET    Take 81 mg by mouth daily.    ATENOLOL (TENORMIN) 50 MG TABLET    TAKE 1 TABLET(50 MG) BY  MOUTH DAILY    DESONIDE (DESOWEN) 0.05 % CREAM    Apply topically 2 (two) times daily as needed.    DILTIAZEM (CARDIZEM) 60 MG TABLET    Take 1 tablet (60 mg total) by mouth every 6 (six) hours.    FLUTICASONE (FLONASE) 50 MCG/ACT NASAL SPRAY    2 sprays by Nasal route daily.    LIDOCAINE (XYLOCAINE) 2 % JELLY    Apply topically as needed.    PANTOPRAZOLE (PROTONIX) 40 MG TABLET    Take 1 tablet (40 mg total) by mouth daily.    VALACYCLOVIR HCL (VALTREX) 500 MG TABLET    Take 1 tablet (500 mg total) by mouth daily.         REVIEW OF SYSTEMS   Review of Systems  Constitutional: Negative for fever and chills.   HENT: Negative for congestion and sore throat.  Eyes: Negative for eye discharge and eye redness.   Cardiovascular: Negative for chest pain and chest pressure   Respiratory: Negative for cough and sputum production.    Gastrointestinal: Positive for abdominal pain and constipation   Genitourinary: Negative for dysuria, urgency and frequency.   Neurological: Negative for dizziness, focal weakness, numbness.   All other systems negative.  PHYSICAL EXAM   ED Triage Vitals   Enc Vitals Group      BP 01/02/16 1654 157/79 mmHg      Heart Rate 01/02/16 1654 71      Resp Rate 01/02/16 1654 16      Temp 01/02/16 1654 98.7 F (37.1 C)      Temp Source 01/02/16 1654 Oral      SpO2 01/02/16 1654 100 %      Weight 01/02/16 1654 84.369 kg      Height 01/02/16 1654 1.549 m      Head Cir --       Peak Flow --       Pain Score 01/02/16 1654 8      Pain Loc --       Pain Edu? --       Excl. in GC? --        Constitutional: Vital signs reviewed. Well appearing, well hydrated, well perfused, non-toxic appearing, no apparent distress  Head:  Normocephalic, atraumatic  Eyes: PERRL, normal conjunctiva bilaterally, EOMI  ENT: Mucous membranes moist.  .  Neck: Normal range of motion. Non-tender.   Respiratory/Chest: clear to auscultation. No work of breathing. No tachypnea..  Cardiovascular:  Regular rate and rhythm. No murmur.    Abdomen: Soft and nontender in all quadrants. No guarding or rebound. No masses or hepatosplenomegaly.Marland Kitchen  UpperExtremity: No edema or cyanosis.  Neurological: Awake and alert. No focal motor deficits by observation.  Skin: Warm and dry. No rash.  Lymphatic: No cervical lymphadenopathy.    MEDICAL DECISION MAKING     65 y.o. female hx of GERD, HPL, gastric bypass, who presents with abdominal pain and constipation     CT scan negative for bowel obstruction  Electrolytes within normal limits     Pt refused rectal exam and attempt to disimpact   Given rx for continued miralax. Senna and colace also added for aggressive bowel regimen     Instructed to follow-up with primary physician and surgeon   Pt agreeable to plan     DISCUSSION        Vital Signs: Reviewed the patient?s vital signs.   Nursing Notes: Reviewed and utilized available nursing notes.  Medical Records Reviewed: Reviewed available past medical records.  Counseling: The emergency provider has spoken with the patient and discussed today?s findings, in addition to providing specific details for the plan of care.  Questions are answered and there is agreement with the plan.      CARDIAC STUDIES    The following cardiac studies were independently interpreted by the Emergency Medicine Physician.  For full cardiac study results please see chart.    Monitor Strip  Interpreted by ED Physician  Rate: 71  Rhythm: NSR   ST Changes: none      IMAGING STUDIES    The following imaging studies were independently interpreted by the Emergency Medicine Physician.  For full imaging study results please see chart.      PULSE OXIMETRY    Oxygen Saturation by Pulse Oximetry: 100%  Interventions: none  Interpretation:  Normal     EMERGENCY DEPT. MEDICATIONS      ED Medication Orders     Start Ordered     Status Ordering Provider    01/02/16 1746 01/02/16 1746  iohexol (OMNIPAQUE) 350 MG/ML injection 100 mL   IMG once as needed     Route: Intravenous  Ordered Dose: 100 mL      Last MAR action:  Imaging Agent Given ADJEI-TWUM, Kaitlyne Friedhoff    01/02/16 1728 01/02/16 1727  sodium chloride 0.9 % bolus 500 mL   Once     Route: Intravenous  Ordered Dose: 500 mL     Last MAR action:  New Bag ADJEI-TWUM, Kerri Asche    01/02/16 1728 01/02/16 1727  ondansetron (ZOFRAN) injection 4 mg   Once     Route: Intravenous  Ordered Dose: 4 mg     Last MAR action:  Given ADJEI-TWUM, Lillyauna Jenkinson          LABORATORY RESULTS    Ordered and independently interpreted AVAILABLE laboratory tests. Please see results section in chart for full details.  Results for orders placed or performed during the hospital encounter of 01/02/16   CBC with differential   Result Value Ref Range    WBC 5.85 3.50 - 10.80 x10 3/uL    Hgb 14.0 12.0 - 16.0 g/dL    Hematocrit 16.1 09.6 - 47.0 %    Platelets 304 140 - 400 x10 3/uL    RBC 5.23 4.20 - 5.40 x10 6/uL    MCV 82.4 80.0 - 100.0 fL    MCH 26.8 (L) 28.0 - 32.0 pg    MCHC 32.5 32.0 -  36.0 g/dL    RDW 19 (H) 12 - 15 %    MPV 9.5 9.4 - 12.3 fL    Neutrophils 57 None %    Lymphocytes Automated 31 None %    Monocytes 11 None %    Eosinophils Automated 1 None %    Basophils Automated 0 None %    Immature Granulocyte Unmeasured None %    Nucleated RBC Unmeasured 0 - 1 /100 WBC    Neutrophils Absolute 3.34 1.80 - 8.10 x10 3/uL    Abs Lymph Automated 1.79 0.50 - 4.40 x10 3/uL    Abs Mono Automated 0.64 0.00 - 1.20 x10 3/uL    Abs Eos Automated 0.05 0.00 - 0.70 x10 3/uL    Absolute Baso Automated 0.03 0.00 - 0.20 x10 3/uL    Absolute Immature Granulocyte Unmeasured 0 x10 3/uL   Comprehensive Metabolic Panel (CMP)   Result Value Ref Range    Glucose 84 70 - 100 mg/dL    BUN 8.0 7.0 - 27.2 mg/dL    Creatinine 0.7 0.6 - 1.0 mg/dL    Sodium 536 644 - 034 mEq/L    Potassium 3.6 3.5 - 5.1 mEq/L    Chloride 102 100 - 111 mEq/L    CO2 24 22 - 29 mEq/L    Calcium 10.1 8.5 - 10.5 mg/dL    Protein, Total 7.1 6.0 - 8.3 g/dL    Albumin 4.2 3.5 - 5.0 g/dL    AST (SGOT) 20 5 - 34 U/L    ALT 16 0 - 55 U/L    Alkaline  Phosphatase 115 (H) 37 - 106 U/L    Bilirubin, Total 0.5 0.2 - 1.2 mg/dL    Globulin 2.9 2.0 - 3.6 g/dL    Albumin/Globulin Ratio 1.4 0.9 - 2.2    Anion Gap 16.0 (H) 5.0 - 15.0   GFR   Result Value Ref Range    EGFR >60.0    Urinalysis   Result Value Ref Range    Urine Type Clean Catch     Color, UA Straw Clear - Yellow    Clarity, UA Clear Clear - Hazy    Specific Gravity UA 1.005 1.001-1.035    Urine pH 6.0 5.0-8.0    Leukocyte Esterase, UA NEGATIVE Negative    Nitrite, UA NEGATIVE Negative    Protein, UR NEGATIVE Negative    Glucose, UA NEGATIVE Negative    Ketones UA 25 (A) Negative    Urobilinogen, UA <2.0 0.2-2.0 mg/dL    Bilirubin, UA NEGATIVE Negative    Blood, UA NEGATIVE Negative           DIAGNOSIS      Diagnosis:  Final diagnoses:   Generalized abdominal pain   Constipation, unspecified constipation type       Disposition:  ED Disposition     Discharge Amber [name] discharge to home/self care.    Condition at disposition: Stable            Prescriptions:  Patient's Medications   New Prescriptions    DOCUSATE SODIUM (COLACE) 100 MG CAPSULE    Take 1 capsule (100 mg total) by mouth 2 (two) times daily.    POLYETHYLENE GLYCOL (MIRALAX) PACKET    Take 17 g by mouth daily as needed.    SENNA (SENOKOT) 8.6 MG TAB    Take 1 tablet (8.6 mg total) by mouth nightly.   Previous Medications    ALBUTEROL (PROVENTIL HFA;VENTOLIN HFA) 108 (90 BASE) MCG/ACT INHALER  Inhale into the lungs every 4 (four) hours as needed.       ASPIRIN EC 81 MG EC TABLET    Take 81 mg by mouth daily.    ATENOLOL (TENORMIN) 50 MG TABLET    TAKE 1 TABLET(50 MG) BY MOUTH DAILY    DESONIDE (DESOWEN) 0.05 % CREAM    Apply topically 2 (two) times daily as needed.    DILTIAZEM (CARDIZEM) 60 MG TABLET    Take 1 tablet (60 mg total) by mouth every 6 (six) hours.    FLUTICASONE (FLONASE) 50 MCG/ACT NASAL SPRAY    2 sprays by Nasal route daily.    LIDOCAINE (XYLOCAINE) 2 % JELLY    Apply topically as needed.    PANTOPRAZOLE (PROTONIX) 40 MG  TABLET    Take 1 tablet (40 mg total) by mouth daily.    VALACYCLOVIR HCL (VALTREX) 500 MG TABLET    Take 1 tablet (500 mg total) by mouth daily.   Modified Medications    No medications on file   Discontinued Medications    No medications on file             Rulon Abide, MD  01/02/16 1840

## 2016-01-08 ENCOUNTER — Encounter (INDEPENDENT_AMBULATORY_CARE_PROVIDER_SITE_OTHER): Payer: Self-pay | Admitting: Internal Medicine

## 2016-01-14 ENCOUNTER — Ambulatory Visit (INDEPENDENT_AMBULATORY_CARE_PROVIDER_SITE_OTHER): Payer: Commercial Managed Care - HMO | Admitting: Cardiology

## 2016-01-15 ENCOUNTER — Other Ambulatory Visit (INDEPENDENT_AMBULATORY_CARE_PROVIDER_SITE_OTHER): Payer: Self-pay | Admitting: Internal Medicine

## 2016-01-15 ENCOUNTER — Telehealth (INDEPENDENT_AMBULATORY_CARE_PROVIDER_SITE_OTHER): Payer: Self-pay | Admitting: Internal Medicine

## 2016-01-15 MED ORDER — DILTIAZEM HCL 60 MG PO TABS
60.0000 mg | ORAL_TABLET | Freq: Three times a day (TID) | ORAL | Status: DC
Start: 2016-01-15 — End: 2016-05-23

## 2016-01-15 NOTE — Telephone Encounter (Signed)
Pt is requesting medication refill please     dilTIAZem (CARDIZEM) 60 MG tablet

## 2016-01-16 ENCOUNTER — Encounter (INDEPENDENT_AMBULATORY_CARE_PROVIDER_SITE_OTHER): Payer: Self-pay | Admitting: Surgery

## 2016-01-16 ENCOUNTER — Ambulatory Visit (INDEPENDENT_AMBULATORY_CARE_PROVIDER_SITE_OTHER): Payer: Commercial Managed Care - HMO | Admitting: Surgery

## 2016-01-16 VITALS — BP 137/84 | HR 73 | Temp 97.5°F | Ht 61.0 in | Wt 182.1 lb

## 2016-01-16 DIAGNOSIS — E66811 Obesity, class 1: Secondary | ICD-10-CM | POA: Insufficient documentation

## 2016-01-16 DIAGNOSIS — E669 Obesity, unspecified: Secondary | ICD-10-CM

## 2016-01-16 DIAGNOSIS — Z9884 Bariatric surgery status: Secondary | ICD-10-CM

## 2016-01-16 NOTE — Progress Notes (Signed)
Assessment:  Status Post Laparoscopic RNY-Gastric Bypass      Plan:  1.  Patient is to continue with portion control  2.  Patient is to increase exercise.  3.  Patient is to continue with supplements.  4.  Patient is to follow up with PCP as needed.  5.  Patient is to follow up in 3 month(s) with labs          Subjective:  [name] returns for follow up 3 month(s) post laparoscopic RNY-Gastric Bypass. She is doing well with 41 lbs. total weight loss. She is tolerating a regular diet and denies any nausea, vomiting, reflux or abdominal pain.  She is compliant with portion control and vitamin supplementation. Her energy level is good. She is exercising regularly.  Lab work was reviewed.    Objective:  The following portions of the patient's history were reviewed and updated as appropriate: allergies, current medications, past family history, past medical history, past social history, past surgical history and problem list.    General appearance: alert, appears stated age and cooperative  Head: Normocephalic, without obvious abnormality, atraumatic  Eyes: negative findings: conjunctivae and sclerae normal  Neck: no adenopathy, no carotid bruit, no JVD, supple, symmetrical, trachea midline and thyroid not enlarged, symmetric, no tenderness/mass/nodules  Lungs: clear to auscultation bilaterally  Heart: regular rate and rhythm, S1, S2 normal, no murmur, click, rub or gallop  Abdomen: soft, non-tender; bowel sounds normal; no masses,  no organomegaly  Extremities: extremities normal, atraumatic, no cyanosis or edema and Homans sign is negative, no sign of DVT      Admission on 01/02/2016, Discharged on 01/02/2016   Component Date Value Ref Range Status   . WBC 01/02/2016 5.85  3.50 - 10.80 x10 3/uL Final   . Hgb 01/02/2016 14.0  12.0 - 16.0 g/dL Final   . Hematocrit 47/82/9562 43.1  37.0 - 47.0 % Final   . Platelets 01/02/2016 304  140 - 400 x10 3/uL Final   . RBC 01/02/2016 5.23  4.20 - 5.40 x10 6/uL  Final   . MCV 01/02/2016 82.4  80.0 - 100.0 fL Final   . MCH 01/02/2016 26.8* 28.0 - 32.0 pg Final   . MCHC 01/02/2016 32.5  32.0 - 36.0 g/dL Final   . RDW 13/06/6577 19* 12 - 15 % Final   . MPV 01/02/2016 9.5  9.4 - 12.3 fL Final   . Neutrophils 01/02/2016 57  None % Final   . Lymphocytes Automated 01/02/2016 31  None % Final   . Monocytes 01/02/2016 11  None % Final   . Eosinophils Automated 01/02/2016 1  None % Final   . Basophils Automated 01/02/2016 0  None % Final   . Immature Granulocyte 01/02/2016 Unmeasured  None % Final   . Nucleated RBC 01/02/2016 Unmeasured  0 - 1 /100 WBC Final   . Neutrophils Absolute 01/02/2016 3.34  1.80 - 8.10 x10 3/uL Final   . Abs Lymph Automated 01/02/2016 1.79  0.50 - 4.40 x10 3/uL Final   . Abs Mono Automated 01/02/2016 0.64  0.00 - 1.20 x10 3/uL Final   . Abs Eos Automated 01/02/2016 0.05  0.00 - 0.70 x10 3/uL Final   . Absolute Baso Automated 01/02/2016 0.03  0.00 - 0.20 x10 3/uL Final   . Absolute Immature Granulocyte 01/02/2016 Unmeasured  0 x10 3/uL Final   . Glucose 01/02/2016 84  70 - 100 mg/dL Final    Comment: ADA guidelines for diabetes mellitus:  Fasting:  Equal to or greater than 126 mg/dL  Random:   Equal to or greater than 200 mg/dL     . BUN 01/02/2016 8.0  7.0 - 19.0 mg/dL Final   . Creatinine 16/08/9603 0.7  0.6 - 1.0 mg/dL Final   . Sodium 54/07/8118 142  136 - 145 mEq/L Final   . Potassium 01/02/2016 3.6  3.5 - 5.1 mEq/L Final   . Chloride 01/02/2016 102  100 - 111 mEq/L Final   . CO2 01/02/2016 24  22 - 29 mEq/L Final   . Calcium 01/02/2016 10.1  8.5 - 10.5 mg/dL Final   . Protein, Total 01/02/2016 7.1  6.0 - 8.3 g/dL Final   . Albumin 14/78/2956 4.2  3.5 - 5.0 g/dL Final   . AST (SGOT) 21/30/8657 20  5 - 34 U/L Final   . ALT 01/02/2016 16  0 - 55 U/L Final   . Alkaline Phosphatase 01/02/2016 115* 37 - 106 U/L Final   . Bilirubin, Total 01/02/2016 0.5  0.2 - 1.2 mg/dL Final   . Globulin 84/69/6295 2.9  2.0 - 3.6 g/dL Final   . Albumin/Globulin Ratio  01/02/2016 1.4  0.9 - 2.2 Final   . Anion Gap 01/02/2016 16.0* 5.0 - 15.0 Final   . EGFR 01/02/2016 >60.0   Final    Comment: Disease State Reference Ranges:    Chronic Kidney Disease; < 60 ml/min/1.73 sq.m    Kidney Failure; < 15 ml/min/1.73 sq.m    [Calculated using IDMS-Traceable MDRD equation (based on    gender, age and black vs. non-black race) recommended by    [employer] Kidney Disease Education Program. No data    available for non-white, non-black race.]  GFR estimates are unreliable in patients with:    Rapidly changing kidney function or recent dialysis,    extreme age, body size or body composition(obesity,    severe malnutrition). Abnormal muscle mass (limb    amputation, muscle wasting). In these patients,    alternative determinations of GFR should be obtained.     . Urine Type 01/02/2016 Clean Catch   Final   . Color, UA 01/02/2016 Straw  Clear - Yellow Final   . Clarity, UA 01/02/2016 Clear  Clear - Hazy Final   . Specific Gravity UA 01/02/2016 1.005  1.001-1.035 Final   . Urine pH 01/02/2016 6.0  5.0-8.0 Final   . Leukocyte Esterase, UA 01/02/2016 NEGATIVE  Negative Final   . Nitrite, UA 01/02/2016 NEGATIVE  Negative Final   . Protein, UR 01/02/2016 NEGATIVE  Negative Final   . Glucose, UA 01/02/2016 NEGATIVE  Negative Final   . Ketones UA 01/02/2016 25* Negative Final   . Urobilinogen, UA 01/02/2016 <2.0  0.2-2.0 mg/dL Final   . Bilirubin, UA 01/02/2016 NEGATIVE  Negative Final   . Blood, UA 01/02/2016 NEGATIVE  Negative Final   Office Visit on 12/13/2015   Component Date Value Ref Range Status   . WBC 12/13/2015 5.76  3.50 - 10.80 x10 3/uL Final   . Hgb 12/13/2015 13.3  12.0 - 16.0 g/dL Final   . Hematocrit 28/41/3244 41.3  37.0 - 47.0 % Final   . Platelets 12/13/2015 332  140 - 400 x10 3/uL Final   . RBC 12/13/2015 4.84  4.20 - 5.40 x10 6/uL Final   . MCV 12/13/2015 85.3  80.0 - 100.0 fL Final   . MCH 12/13/2015 27.5* 28.0 - 32.0 pg Final   . MCHC 12/13/2015 32.2  32.0 - 36.0 g/dL Final   .  RDW  12/13/2015 18* 12 - 15 % Final   . MPV 12/13/2015 11.0  9.4 - 12.3 fL Final   . Neutrophils 12/13/2015 64  None % Final   . Lymphocytes Automated 12/13/2015 22  None % Final   . Monocytes 12/13/2015 12  None % Final   . Eosinophils Automated 12/13/2015 1  None % Final   . Basophils Automated 12/13/2015 0  None % Final   . Immature Granulocyte 12/13/2015 0  None % Final   . Nucleated RBC 12/13/2015 0  0 - 1 /100 WBC Final   . Neutrophils Absolute 12/13/2015 3.72  1.80 - 8.10 x10 3/uL Final   . Abs Lymph Automated 12/13/2015 1.28  0.50 - 4.40 x10 3/uL Final   . Abs Mono Automated 12/13/2015 0.66  0.00 - 1.20 x10 3/uL Final   . Abs Eos Automated 12/13/2015 0.08  0.00 - 0.70 x10 3/uL Final   . Absolute Baso Automated 12/13/2015 0.01  0.00 - 0.20 x10 3/uL Final   . Absolute Immature Granulocyte 12/13/2015 0.01  0 x10 3/uL Final   . Glucose 12/13/2015 101* 70 - 100 mg/dL Final    Comment: ADA guidelines for diabetes mellitus:  Fasting:  Equal to or greater than 126 mg/dL  Random:   Equal to or greater than 200 mg/dL     . BUN 12/13/2015 7.0  7.0 - 19.0 mg/dL Final   . Creatinine 09/81/1914 0.7  0.4 - 1.5 mg/dL Final   . Calcium 78/29/5621 10.2  8.5 - 10.5 mg/dL Final   . Sodium 30/86/5784 144  135 - 146 mEq/L Final   . Potassium 12/13/2015 4.4  3.5 - 5.3 mEq/L Final   . Chloride 12/13/2015 108  100 - 111 mEq/L Final   . CO2 12/13/2015 29  21 - 30 mEq/L Final   . Ferritin 12/13/2015 25.92  4.60 - 204.00 ng/mL Final   . Iron 12/13/2015 70  40 - 145 ug/dL Final   . UIBC 69/62/9528 215  126 - 382 ug/dL Final   . TIBC 41/32/4401 285  265 - 497 ug/dL Final   . Iron Saturation 12/13/2015 25  15 - 50 % Final   . Vitamin B-12 12/13/2015 1605* 211 - 911 pg/mL Final   . Folate 12/13/2015 16.1  See below ng/mL Final    Comment: Deficient    : <3.5 ng/mL  Intermediate : 3.5 - 5.4 ng/mL  Normal       : Greater Than 5.4 ng/mL     . Vitamin B1 (Thiamine) 12/13/2015 23  8 - 30 nmol/L Final    Comment: Vitamin supplementation within 24  hours prior to  blood draw may affect the accuracy of the results.  This test was developed and its analytical performance  characteristics have been determined by [facility] [place], [place]. It has  not been cleared or approved by the U.S. Food and Drug  Administration. This assay has been validated pursuant  to the CLIA regulations and is used for clinical  purposes.  Test Performed by Cler[facility],  [facility],  [address], [place], [place] [address]  Si Raider, M.D., Ph.D., Director of Laboratories  [phone], CLIA [id number]     . Hemolysis Index 12/13/2015 9  0 - 18 Final   . EGFR 12/13/2015 >60.0   Final    Comment: Disease State Reference Ranges:    Chronic Kidney Disease; < 60 ml/min/1.73 sq.m    Kidney Failure; <  15 ml/min/1.73 sq.m    [Calculated using IDMS-Traceable MDRD equation (based on    gender, age and black vs. non-black race) recommended by    [employer] Disease Education Program. No data    available for non-white, non-black race.]  GFR estimates are unreliable in patients with:    Rapidly changing kidney function or recent dialysis,    extreme age, body size or body composition(obesity,    severe malnutrition). Abnormal muscle mass (limb    amputation, muscle wasting). In these patients,    alternative determinations of GFR should be obtained.     Admission on 10/22/2015, Discharged on 10/24/2015   Component Date Value Ref Range Status   . ABO Rh 10/22/2015 A NEG   Final   . AB Screen Gel 10/22/2015 NEG   Final   . WBC 10/23/2015 10.14  3.50 - 10.80 x10 3/uL Final   . Hgb 10/23/2015 11.0* 12.0 - 16.0 g/dL Final   . Hematocrit 13/24/4010 35.2* 37.0 - 47.0 % Final   . Platelets 10/23/2015 287  140 - 400 x10 3/uL Final   . RBC 10/23/2015 4.23  4.20 - 5.40 x10 6/uL Final   . MCV 10/23/2015 83.2  80.0 - 100.0 fL Final   . MCH 10/23/2015 26.0* 28.0 - 32.0 pg Final   . MCHC 10/23/2015 31.3* 32.0 - 36.0 g/dL Final   . RDW  27/25/3664 16* 12 - 15 % Final   . MPV 10/23/2015 9.3* 9.4 - 12.3 fL Final   . Neutrophils 10/23/2015 79  None % Final   . Lymphocytes Automated 10/23/2015 10  None % Final   . Monocytes 10/23/2015 11  None % Final   . Eosinophils Automated 10/23/2015 0  None % Final   . Basophils Automated 10/23/2015 0  None % Final   . Immature Granulocyte 10/23/2015 0  None % Final   . Nucleated RBC 10/23/2015 0  0 - 1 /100 WBC Final   . Neutrophils Absolute 10/23/2015 7.99  1.80 - 8.10 x10 3/uL Final   . Abs Lymph Automated 10/23/2015 1.00  0.50 - 4.40 x10 3/uL Final   . Abs Mono Automated 10/23/2015 1.11  0.00 - 1.20 x10 3/uL Final   . Abs Eos Automated 10/23/2015 0.03  0.00 - 0.70 x10 3/uL Final   . Absolute Baso Automated 10/23/2015 0.01  0.00 - 0.20 x10 3/uL Final   . Absolute Immature Granulocyte 10/23/2015 0.02  0 x10 3/uL Final   . Magnesium 10/23/2015 1.8  1.6 - 2.6 mg/dL Final   . Phosphorus 40/34/7425 3.0  2.3 - 4.7 mg/dL Final   . Creatine Kinase (CK) 10/23/2015 127  29 - 168 U/L Final   . Glucose 10/23/2015 143* 70 - 100 mg/dL Final    Comment: ADA guidelines for diabetes mellitus:  Fasting:  Equal to or greater than 126 mg/dL  Random:   Equal to or greater than 200 mg/dL     . BUN 10/23/2015 8  7 - 19 mg/dL Final   . Creatinine 95/63/8756 0.8  0.6 - 1.0 mg/dL Final   . Calcium 43/32/9518 8.3* 8.5 - 10.5 mg/dL Final   . Sodium 84/16/6063 137  136 - 145 mEq/L Final   . Potassium 10/23/2015 4.0  3.5 - 5.1 mEq/L Final   . Chloride 10/23/2015 103  100 - 111 mEq/L Final   . CO2 10/23/2015 27  22 - 29 mEq/L Final   . Anion Gap 10/23/2015 7.0  5.0 - 15.0 Final   .  EGFR 10/23/2015 >60.0   Final    Comment: Disease State Reference Ranges:    Chronic Kidney Disease; < 60 ml/min/1.73 sq.m    Kidney Failure; < 15 ml/min/1.73 sq.m    [Calculated using IDMS-Traceable MDRD equation (based on    gender, age and black vs. non-black race) recommended by    [employer] Kidney Disease Education Program. No data    available for non-white,  non-black race.]  GFR estimates are unreliable in patients with:    Rapidly changing kidney function or recent dialysis,    extreme age, body size or body composition(obesity,    severe malnutrition). Abnormal muscle mass (limb    amputation, muscle wasting). In these patients,    alternative determinations of GFR should be obtained.         Ct Abd/ Pelvis With Iv Contrast    01/02/2016  HISTORY: Prior gastric bypass. Clinical concern for certain for SBO. EXAMINATION AND TECHNIQUE: Spiral low dose modulated CT of abdomen and pelvis presented for evaluation. Nonionic intravenous contrast administered. 100 cc of Omnipaque 350 administered. Note that CT scanning at this site utilizes multiple dose reduction techniques including automatic exposure control, adjustment of the MAS and/or KVP according to patient size, and use of iterative reconstruction technique. COMPARISON: CT 10/19/2012 FINDINGS: Visualized lung bases exhibit mild atelectasis. The liver contains stable small hepatic cysts, largest measuring 1.1 cm at the dome. The spleen, adrenal glands, and pancreas are intact. Gallbladder is surgically absent on the current examination. No biliary dilatation. In the kidneys, there is no hydronephrosis nor perinephric infiltration. There is a 1.9 cm right renal cyst. No ascites. No bulky adenopathy. Abdominal aorta normal in caliber. Intestinal system exhibits no obstruction or inflammatory changes. There are intervening post surgical changes of patient's known gastric bypass procedure. No CT evidence of abscess or free air. Osseous structures are intact. The pelvis contains no free fluid or bulky adenopathy. The bladder is intact. The uterus remains bulky in contour compatible with fibroids. No CT evidence of abnormal adnexal masses.     01/02/2016   No CT evidence of acute process as discussed above. Ivin Poot, MD 01/02/2016 5:55 PM   .         Ct Abd/ Pelvis With Iv Contrast    01/02/2016  HISTORY: Prior gastric  bypass. Clinical concern for certain for SBO. EXAMINATION AND TECHNIQUE: Spiral low dose modulated CT of abdomen and pelvis presented for evaluation. Nonionic intravenous contrast administered. 100 cc of Omnipaque 350 administered. Note that CT scanning at this site utilizes multiple dose reduction techniques including automatic exposure control, adjustment of the MAS and/or KVP according to patient size, and use of iterative reconstruction technique. COMPARISON: CT 10/19/2012 FINDINGS: Visualized lung bases exhibit mild atelectasis. The liver contains stable small hepatic cysts, largest measuring 1.1 cm at the dome. The spleen, adrenal glands, and pancreas are intact. Gallbladder is surgically absent on the current examination. No biliary dilatation. In the kidneys, there is no hydronephrosis nor perinephric infiltration. There is a 1.9 cm right renal cyst. No ascites. No bulky adenopathy. Abdominal aorta normal in caliber. Intestinal system exhibits no obstruction or inflammatory changes. There are intervening post surgical changes of patient's known gastric bypass procedure. No CT evidence of abscess or free air. Osseous structures are intact. The pelvis contains no free fluid or bulky adenopathy. The bladder is intact. The uterus remains bulky in contour compatible with fibroids. No CT evidence of abnormal adnexal masses.     01/02/2016  No CT evidence of acute process as discussed above. Ivin Poot, MD 01/02/2016 5:55 PM       Lin Hackmann R. Cayla Wiegand, DO, FACS, FASMBS, FACOS

## 2016-02-11 ENCOUNTER — Telehealth (INDEPENDENT_AMBULATORY_CARE_PROVIDER_SITE_OTHER): Payer: Self-pay

## 2016-02-11 NOTE — Telephone Encounter (Signed)
Called the patient, who complains of crampy abdominal pain and constipation since Sunday.  She was advised to try MiraLAX, enemas as needed, and if ineffective a dose of mag citrate.  She also complains of a feeling of lightheadedness intermittently. She is drinking over 64 ounces of fluid daily.  She was advised to follow up with her primary care provider for this issue if mild or to urgent care or emergency if it increases or she feels like she may faint.  She denies other complaint at this time.  Patient understood and agreed with this plan.  All of her questions were answered.

## 2016-02-11 NOTE — Telephone Encounter (Signed)
Patient called the office today c/o mid abd pain onset for 3x. (Patient stated she wanted to go to the ER but wanted to call the office first).   She has had x4 BM yesterday form of "pellets". x2 BM today "bigger pellets today".  Patient also feels like she is going to "passout".  No Fevers, chills or vomiting.  Patient does feel nauseated.   Patient can be contacted at [phone].   Informed patient that Dr. Jeanell Sparrow is out of the office today  Routed message to [facility]

## 2016-02-14 ENCOUNTER — Telehealth (HOSPITAL_BASED_OUTPATIENT_CLINIC_OR_DEPARTMENT_OTHER): Payer: Self-pay | Admitting: Gastroenterology

## 2016-02-14 NOTE — Telephone Encounter (Signed)
Sharepoint Assessment Questions    Ht: 5'4"  Wt: 160 lbs  BMI: 27.5    GENERAL QUESTIONS:  Does the patient have sufficient understanding of English? Yes    Is the patient able to provide consent? Yes       Patient Assessment    Do you have an allergy to Fentanyl or Versed? No    Have you ever had any problems in the past with sedation (Different from General Anesthesia)? No    Do you use narcotics on a daily basis? No    Does you use oxygen at Home?  No    Are you taking any Anti-coagulant or Anti-Platelet Medications? No    Do you have any bleeding or coagulation disorders? No    Are you Diabetic?  No    Are you on dialysis? No    Do you have diagnosed sleep apnea? No    Do you currently have a diagnosis of Congestive Heart Failure or CHF that you are being treated for? No    Do you have a Pacemaker or Defibrillator? No    Are you a difficult IV start? No    Is the patient a female & younger than 64yrs old? No    Do you have any mobility issues that make it difficult to get onto a stretcher? No    Is this patient scheduled for a colonoscopy? No    Is this an Upper Endoscopy SEDATION Case (EGD, EUS, PEG)? Yes  If yes, are you able to open your mouth fully without any difficulty? Yes  If no, arranged for Anesthesia? N/a    Is the patient scheduled for an ERCP? No    Is this patient scheduled for a PEG? No    Is the patient scheduled for an ALS PEG?  No    PROCEDURE        Procedure MD: Tye Savoy  Procedure Type: EUS  Procedure Date:  5/11     Time: 1115    Procedure Check-In Time: 1045    Patient Teaching  Who received the teaching - Patient? Yes    Was the topic of stopping iron supplements taught? N/a    Was the topic of blood thinners taught?  Yes    Was the topic of diet taught? Yes    Were instructions for taking current medications taught? Yes    Was our transportation policy taught? Yes    What is the name of their driver? Sister or neighbor    How were the procedure preparation instruction materials  delivered?  Verbal: Yes  On Paper: No  eCare message: No  Email: Yes    Does this procedure require bowel prep? No  If yes, were instructions for the ordered laxative taught? No  If yes, which RX was prescribed? N/a.    General Notes:

## 2016-02-14 NOTE — Telephone Encounter (Signed)
Generated referral. Will call patient to schedule.

## 2016-02-14 NOTE — Telephone Encounter (Signed)
Patient calling and requesting scheduling for next EUS. Patient is due for her yearly EUS this coming May. Routing to PCCs to schedule.     Dr.Saunders EUS Report 03/18/15:  EUS Findings: There was a small, hypoechoic, round unilocular cyst in the pancreatic head measuring 2.7 mm. There was no mural nodularity  or communication with the pancreatic duct. The pancreatic duct measured 3.4 mm in the head.the bile duct appeared normal. The pancreatic  parenchyma was otherwise normal with no masses, or findings of chronic pancreatitis. There was no peripancreatic, periceliac or gastric  lymphadenopathy. Limited views of the left kidney, adrenal gland, spleen, celiac axis were normal.  Estimated Blood Loss: None.  Intraprocedural Events: There were no unplanned events during the procedure.  Post Proc Diagnostic Conclusion:   Small, stable appearing pancreatic cyst without concerning features. Otherwise normal EUS.  Recommendations:1. Repeat EUS in one year  Performed By: The procedure was perfomed by Jarvis Morgan, M.D. I, Sandford Craze, M.D,

## 2016-02-19 ENCOUNTER — Encounter (INDEPENDENT_AMBULATORY_CARE_PROVIDER_SITE_OTHER): Payer: Self-pay | Admitting: Internal Medicine

## 2016-03-04 ENCOUNTER — Other Ambulatory Visit (INDEPENDENT_AMBULATORY_CARE_PROVIDER_SITE_OTHER): Payer: Self-pay | Admitting: Internal Medicine

## 2016-03-11 ENCOUNTER — Emergency Department
Admission: EM | Admit: 2016-03-11 | Discharge: 2016-03-11 | Disposition: A | Discharge status: Home / Self Care | Payer: Commercial Managed Care - HMO | Attending: Student in an Organized Health Care Education/Training Program | Admitting: Student in an Organized Health Care Education/Training Program

## 2016-03-11 ENCOUNTER — Emergency Department: Payer: Commercial Managed Care - HMO

## 2016-03-11 DIAGNOSIS — R1084 Generalized abdominal pain: Secondary | ICD-10-CM | POA: Insufficient documentation

## 2016-03-11 DIAGNOSIS — Z79899 Other long term (current) drug therapy: Secondary | ICD-10-CM | POA: Insufficient documentation

## 2016-03-11 DIAGNOSIS — K5901 Slow transit constipation: Secondary | ICD-10-CM | POA: Insufficient documentation

## 2016-03-11 DIAGNOSIS — I10 Essential (primary) hypertension: Secondary | ICD-10-CM | POA: Insufficient documentation

## 2016-03-11 DIAGNOSIS — G4733 Obstructive sleep apnea (adult) (pediatric): Secondary | ICD-10-CM | POA: Insufficient documentation

## 2016-03-11 DIAGNOSIS — Z7982 Long term (current) use of aspirin: Secondary | ICD-10-CM | POA: Insufficient documentation

## 2016-03-11 DIAGNOSIS — K219 Gastro-esophageal reflux disease without esophagitis: Secondary | ICD-10-CM | POA: Insufficient documentation

## 2016-03-11 DIAGNOSIS — Z8673 Personal history of transient ischemic attack (TIA), and cerebral infarction without residual deficits: Secondary | ICD-10-CM | POA: Insufficient documentation

## 2016-03-11 DIAGNOSIS — Z9884 Bariatric surgery status: Secondary | ICD-10-CM | POA: Insufficient documentation

## 2016-03-11 HISTORY — DX: Constipation, unspecified: K59.00

## 2016-03-11 LAB — CBC AND DIFFERENTIAL
Basophils Absolute Automated: 0.01 10*3/uL (ref 0.00–0.20)
Basophils Automated: 0 %
Eosinophils Absolute Automated: 0.07 10*3/uL (ref 0.00–0.70)
Eosinophils Automated: 1 %
Hematocrit: 42.4 % (ref 37.0–47.0)
Hgb: 14.3 g/dL (ref 12.0–16.0)
Lymphocytes Absolute Automated: 1.96 10*3/uL (ref 0.50–4.40)
Lymphocytes Automated: 35 %
MCH: 28.8 pg (ref 28.0–32.0)
MCHC: 33.7 g/dL (ref 32.0–36.0)
MCV: 85.5 fL (ref 80.0–100.0)
MPV: 10.1 fL (ref 9.4–12.3)
Monocytes Absolute Automated: 0.57 10*3/uL (ref 0.00–1.20)
Monocytes: 10 %
Neutrophils Absolute: 3.02 10*3/uL (ref 1.80–8.10)
Neutrophils: 54 %
Platelets: 249 10*3/uL (ref 140–400)
RBC: 4.96 10*6/uL (ref 4.20–5.40)
RDW: 16 % — ABNORMAL HIGH (ref 12–15)
WBC: 5.63 10*3/uL (ref 3.50–10.80)

## 2016-03-11 LAB — COMPREHENSIVE METABOLIC PANEL
ALT: 16 U/L (ref 0–55)
AST (SGOT): 17 U/L (ref 5–34)
Albumin/Globulin Ratio: 1.6 (ref 0.9–2.2)
Albumin: 4 g/dL (ref 3.5–5.0)
Alkaline Phosphatase: 119 U/L — ABNORMAL HIGH (ref 37–106)
Anion Gap: 15 (ref 5.0–15.0)
BUN: 14 mg/dL (ref 7.0–19.0)
Bilirubin, Total: 0.4 mg/dL (ref 0.2–1.2)
CO2: 22 mEq/L (ref 22–29)
Calcium: 9.9 mg/dL (ref 8.5–10.5)
Chloride: 103 mEq/L (ref 100–111)
Creatinine: 0.7 mg/dL (ref 0.6–1.0)
Globulin: 2.5 g/dL (ref 2.0–3.6)
Glucose: 93 mg/dL (ref 70–100)
Potassium: 4.2 mEq/L (ref 3.5–5.1)
Protein, Total: 6.5 g/dL (ref 6.0–8.3)
Sodium: 140 mEq/L (ref 136–145)

## 2016-03-11 LAB — GFR: EGFR: >60

## 2016-03-11 MED ORDER — SODIUM CHLORIDE 0.9 % IV BOLUS
1000.0000 mL | Freq: Once | INTRAVENOUS | Status: AC
Start: 2016-03-11 — End: 2016-03-11
  Administered 2016-03-11: 1000 mL via INTRAVENOUS

## 2016-03-11 MED ORDER — ONDANSETRON HCL 4 MG/2ML IJ SOLN
4.0000 mg | Freq: Once | INTRAMUSCULAR | Status: AC
Start: 2016-03-11 — End: 2016-03-11
  Administered 2016-03-11: 4 mg via INTRAVENOUS
  Filled 2016-03-11: qty 2

## 2016-03-11 MED ORDER — IOHEXOL 350 MG/ML IV SOLN
INTRAVENOUS | Status: AC
Start: 2016-03-11 — End: 2016-03-11
  Administered 2016-03-11: 100 mL via INTRAVENOUS
  Filled 2016-03-11: qty 100

## 2016-03-11 MED ORDER — IOHEXOL 350 MG/ML IV SOLN
100.0000 mL | Freq: Once | INTRAVENOUS | Status: AC | PRN
Start: 2016-03-11 — End: 2016-03-11

## 2016-03-11 MED ORDER — MORPHINE SULFATE 2 MG/ML IJ/IV SOLN (WRAP)
2.0000 mg | Freq: Once | Status: AC
Start: 2016-03-11 — End: 2016-03-11
  Administered 2016-03-11: 2 mg via INTRAVENOUS
  Filled 2016-03-11: qty 1

## 2016-03-11 NOTE — Discharge Instructions (Signed)
Please continue to closely monitor your symptoms  Return to the ER immediately if any worsening pain/nausea/vomiting/weakness/fever/chills/chest pain/difficulty breathing/dizziness/lighhteadedness or any other worsening concerns  Please follow up with your gastroenterologist    Thank you for allowing [place] to address your concerns. Hope you feel better soon!  Sharon Seller M.D.  Emergency Medicine

## 2016-03-11 NOTE — ED Notes (Signed)
Pt discharged to home. Pt A&O x 4, resp is reg and unlabored, skin is warm and dry. Discharge instructions give and information about F/U care with questions answered. Pt verbalize understanding of  Instructions. Pt ambulating to lobby with steady gait.

## 2016-03-11 NOTE — ED Notes (Signed)
Pt stating she has abd pain x 3 says with constipation. She also feels pressure in her rectum. Today, pt with 2 BM one hard that she self disimpacted and another which was soft. Pt stating that her abd pain is mid abd that radiated to her back with nausea

## 2016-03-11 NOTE — ED Provider Notes (Signed)
EMERGENCY DEPARTMENT NOTE    Physician/Midlevel provider first contact with patient: 03/11/16 2007         HISTORY OF PRESENT ILLNESS   Historian:Patient  Translator Used: No    Chief Complaint: Abdominal Pain         65 y.o. female with hx noted below, hx of gastric bypass, p/w abdominal pain. Pt reports since gastric bypass in Dec 2016, pt has been struggling with constipation/bowel movements. Pt reports the past 3 days with hard stool, last BM today at 3PM was looser. +abd pain, most prominent on mid abdomen, pain also felt at back. No fever/chills/dysuria/dizziness/CP/SOB/syncope. Initial triage note reports pt self disimpacted stool. Pt reports to me that she did not disimpact, she just wiped off hard stool with toilet paper. No rectal bleeding/melena        MEDICAL HISTORY     Past Medical History:  Past Medical History   Diagnosis Date   . GERD (gastroesophageal reflux disease)    . Hiatal hernia    . Genital herpes      no current outbreak (10/03/15)   . Hyperlipidemia    . Osteoarthritis of knees, bilateral    . Nausea without vomiting    . OSA on CPAP 2015     CPAP nightly x 1 yr   . Difficulty walking      amb with cane secondary to arthritis bil knees   . Morbid obesity with BMI of 40.0-44.9, adult      BMI 40.9   . TIA (transient ischemic attack) 2014     TIA 2014-no residual. Patient evaulated for possible TIA 07/12/2015  @ [facility] (dischage summary in epic)-per summary CT of head was normal and patient was d/c'd   . Hypertensive disorder      Well controlled on med. Denies any SOB in last 6 months (10/03/15)   . Chest pain 2016      Chest pain after eating x6  months--being followed by GI for GERD   . Constipation        Past Surgical History:  Past Surgical History   Procedure Laterality Date   . Ovary surgery Right >25 yrs   . Fallopian tube surgery  >25 yrs   . Laparoscopic, cholecystectomy, cholangiogram  08/13/2014   . Appendectomy  >25 yrs   . Spine surgery  11/2013     cervical HNP x 2, Dr. Bufford Buttner    . Egd  01/2015   . Laparoscopic, gastric bypass N/A 10/22/2015     Procedure: LAPAROSCOPIC, GASTRIC BYPASS;  Surgeon: Josefa Half R, DO;  Location: [facility];  Service: General;  Laterality: N/A;   . Insertion, pain pump (medical) N/A 10/22/2015     Procedure: INSERTION, PAIN PUMP (MEDICAL);  Surgeon: Nicola Police, DO;  Location: [facility];  Service: General;  Laterality: N/A;   . Laparoscopic, herniorrhaphy, hiatal N/A 10/22/2015     Procedure: LAPAROSCOPIC, HERNIORRHAPHY, HIATAL;  Surgeon: Nicola Police, DO;  Location:  MAIN OR;  Service: General;  Laterality: N/A;       Social History:  Social History     Social History   . Marital Status: Single     Spouse Name: N/A   . Number of Children: 2   . 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 Smoking status: Never Smoker    .  Smokeless tobacco: Never Used   . Alcohol Use: No   . Drug Use: No   . Sexual Activity: No     Other Topics Concern   . Dietary Supplements / Vitamins Yes   . Anesthesia Problems No   . Blood Thinners No   . Eats Large Amounts No   . Excessive Sweets No   . Skips Meals No   . Eats Excessive Starches Yes   . Snacks Or Grazes No   . Emotional Eater Yes   . Eats Fried Food Yes   . Eats Fast Food Yes   . Diet Center No   . Hmr No   . [name] No   . La Weight Loss No   . Nutri-System No   . Opti-Fast / Medi-Fast No   . Overeaters Anonymous No   . [facility] Weight Loss Center No   . Tops No   . Weight Watchers No   . Atkins No   . Binging / Purging No   . Body For Life No   . Cabbage Soup No   . Calorie Counting No   . Fasting No   . [name] No   . Health Spa No   . Herbal Life No   . High Protein No   . Low Carb No   . Low Fat No   . [facility] Diet No   . [place] Diet No   . Margie Billet Diet No   . [place] Diet No   . Slim Fast No   . [place] No   . Sugar Busters No   . Vomiting No   . Zone Diet No   .  Stationary Cycle Or Treadmill No   . Gym/Fitness Classes No   . Home Exercise/Video No   . Swimming No   . Team Sports No   . Weight Training No   . Walking Or Running No   . Hospitalization No   . Hypnosis No   . Physical Therapy No   . Psychological Therapy No   . Residential Program No   . Acutrim No   . Amphetamines No   . Anorex No   . Byetta No   . Dexatrim No   . Didrex No   . Fastin No   . Fen - Phen No   . Ionamin / Adipex No   . Mazanor No   . Meridia No   . Obalan No   . Phendiet No   . Phentrol No   . Phenteramine Yes   . Plegine No   . Pondimin No   . Qsymia No   . Prozac No   . Redux No   . Sanorex No   . Ten[place] No   . Tepanole No   . Wechless No   . Wellbutrin No   . Xenical (Orlistat, Alli) No   . Other Med No   . No Impairment Yes     Chronic Bilat Knee Pain   . Walks With Cane/Crutch Yes   . Requires A Wheelchair No   . Bedridden No   . Are You Currently Being Treated For Depression? No   . Do You Snore? Yes   . Are You Receiving Any Medical Or Psychological Services? No   . Do You Have Or Have You Been Treated For An Eating Disorder? No   . Do You Exercise Regularly? No   . Have You Or Family Member  Ever Have Trouble With Anesthesia? No     Social History Narrative       Family History:  Family History   Problem Relation Age of Onset   . Cancer Mother 32     breast cancer       Outpatient Medication:  Discharge Medication List as of 03/11/2016 10:06 PM      CONTINUE these medications which have NOT CHANGED    Details   albuterol (PROVENTIL HFA;VENTOLIN HFA) 108 (90 BASE) MCG/ACT inhaler Inhale into the lungs every 4 (four) hours as needed.   , Starting 01/26/2012, Until Discontinued, Historical Med      aspirin EC 81 MG EC tablet Take 81 mg by mouth daily., Until Discontinued, Historical Med      atenolol (TENORMIN) 50 MG tablet TAKE 1 TABLET(50 MG) BY MOUTH DAILY, Normal      desonide (DESOWEN) 0.05 % cream Apply topically 2 (two) times daily as needed., Starting 11/07/2014, Until Discontinued,  Normal      dilTIAZem (CARDIZEM) 60 MG tablet Take 1 tablet (60 mg total) by mouth 3 (three) times daily., Starting 01/15/2016, Until Discontinued, Normal      fluticasone (FLONASE) 50 MCG/ACT nasal spray 2 sprays by Nasal route daily., Starting 01/02/2014, Until Discontinued, Normal      lidocaine (XYLOCAINE) 2 % jelly Apply topically as needed., Starting 12/13/2015, Until Sat 12/12/16, Normal      pantoprazole (PROTONIX) 40 MG tablet Take 1 tablet (40 mg total) by mouth daily., Starting 11/07/2014, Until Discontinued, Normal      polyethylene glycol (MIRALAX) packet Take 17 g by mouth daily as needed., Starting 01/02/2016, Until Discontinued, Print      senna (SENOKOT) 8.6 MG Tab Take 1 tablet (8.6 mg total) by mouth nightly., Starting 01/02/2016, Until Discontinued, Print      valACYclovir HCL (VALTREX) 500 MG tablet Take 1 tablet (500 mg total) by mouth daily., Starting 09/30/2015, Until Discontinued, Normal               REVIEW OF SYSTEMS     Review of Systems: All other systems reviewed and are negative  CONSTITUTIONAL: No fever/chills  HEENT: No visual loss, blurred vision, double vision or yellow sclerae. No hearing loss, sneezing, congestion, runny nose or sore throat.  SKIN: No rash or itching.  CARDIOVASCULAR: No chest pain, chest pressure or chest discomfort. No palpitations or edema.  RESPIRATORY: No shortness of breath, cough or sputum.  GASTROINTESTINAL: constipation. abd pain, nausea  GENITOURINARY: No dysuria or hematuria  NEUROLOGICAL: No headache, dizziness, syncope, paralysis, ataxia, numbness or tingling in the extremities. No change in bowel or bladder control.  MUSCULOSKELETAL: No muscle, back pain, joint pain or stiffness.        PHYSICAL EXAM   BP 141/70 mmHg  Pulse 68  Temp(Src) 97.7 F (36.5 C)  Resp 18  Ht 5\' 1"  (1.549 m)  Wt 73.483 kg  BMI 30.63 kg/m2  SpO2 97%  Physical Exam:  GEN: NAD, A&O x3, appears well  HEENT: MMM  NECK: soft/supple, FROM with ease  CV: RRR, nl S1/S2, equal pulses  b/l  LUNG: CTA b/l, good air entry b/l  ABDOMEN: soft, ND, NT, +BS, no pulsatile mass appreciated. no R/G  EXTREMITIES: no erythema/TTP  NEURO:  motor strength 5/5 all ext, sensation intact, no ataxia  SKIN: no rash        MEDICAL DECISION MAKING       Vital Signs: Reviewed the patient?s vital signs.   Nursing Notes:  Reviewed and utilized available nursing notes.  Medical Records Reviewed: Reviewed available past medical records.  Counseling: The emergency provider has spoken with the patient and discussed today?s findings, in addition to providing specific details for the plan of care.  Questions are answered and there is agreement with the plan.            PULSE OXIMETRY    Oxygen Saturation by Pulse Oximetry: 97%  Interventions: none  Interpretation:  normal    EMERGENCY DEPT. MEDICATIONS      ED Medication Orders     Start Ordered     Status Ordering Provider    03/11/16 2111 03/11/16 2112  iohexol (OMNIPAQUE) 350 MG/ML injection 100 mL   IMG once as needed     Route: Intravenous  Ordered Dose: 100 mL     Last MAR action:  Imaging Agent Given Janice Bodine    03/11/16 2013 03/11/16 2012  morphine injection 2 mg   Once     Route: Intravenous  Ordered Dose: 2 mg     Last MAR action:  Given Hindy Perrault    03/11/16 2013 03/11/16 2012  ondansetron (ZOFRAN) injection 4 mg   Once     Route: Intravenous  Ordered Dose: 4 mg     Last MAR action:  Given Angelene Rome    03/11/16 2008 03/11/16 2007  sodium chloride 0.9 % bolus 1,000 mL   Once     Route: Intravenous  Ordered Dose: 1,000 mL     Last MAR action:  New Bag Tore Carreker          LABORATORY RESULTS    Ordered and independently interpreted AVAILABLE laboratory tests. Please see results section in chart for full details.        DISCUSSION/ED COURSE    65 y.o. female with hx of gastric bypass, p/w abd pain. Pt overall appearing well. CT without acute findings, no obstruction/internal hernia. Pt reports improvement of symptoms, wishes to go home.   The pt is stable  for discharge, counseling is provided, discussed symptomatic treatment and specific conditions for return.  Pt will closely monitor symptoms and followup. Will return immediately if any persistent/worsening symptoms/concerns.       DIAGNOSIS      Diagnosis:  Final diagnoses:   Generalized abdominal pain   Slow transit constipation       Disposition:  ED Disposition     Discharge DORALEE KOCAK discharge to home/self care.    Condition at disposition: Stable            Prescriptions:  Discharge Medication List as of 03/11/2016 10:06 PM      CONTINUE these medications which have NOT CHANGED    Details   albuterol (PROVENTIL HFA;VENTOLIN HFA) 108 (90 BASE) MCG/ACT inhaler Inhale into the lungs every 4 (four) hours as needed.   , Starting 01/26/2012, Until Discontinued, Historical Med      aspirin EC 81 MG EC tablet Take 81 mg by mouth daily., Until Discontinued, Historical Med      atenolol (TENORMIN) 50 MG tablet TAKE 1 TABLET(50 MG) BY MOUTH DAILY, Normal      desonide (DESOWEN) 0.05 % cream Apply topically 2 (two) times daily as needed., Starting 11/07/2014, Until Discontinued, Normal      dilTIAZem (CARDIZEM) 60 MG tablet Take 1 tablet (60 mg total) by mouth 3 (three) times daily., Starting 01/15/2016, Until Discontinued, Normal      fluticasone (FLONASE) 50 MCG/ACT nasal spray 2 sprays  by Nasal route daily., Starting 01/02/2014, Until Discontinued, Normal      lidocaine (XYLOCAINE) 2 % jelly Apply topically as needed., Starting 12/13/2015, Until Sat 12/12/16, Normal      pantoprazole (PROTONIX) 40 MG tablet Take 1 tablet (40 mg total) by mouth daily., Starting 11/07/2014, Until Discontinued, Normal      polyethylene glycol (MIRALAX) packet Take 17 g by mouth daily as needed., Starting 01/02/2016, Until Discontinued, Print      senna (SENOKOT) 8.6 MG Tab Take 1 tablet (8.6 mg total) by mouth nightly., Starting 01/02/2016, Until Discontinued, Print      valACYclovir HCL (VALTREX) 500 MG tablet Take 1 tablet (500 mg total) by  mouth daily., Starting 09/30/2015, Until Discontinued, Normal                       Sharon Seller, MD  03/12/16 [phone]

## 2016-03-12 ENCOUNTER — Ambulatory Visit: Payer: No Typology Code available for payment source | Attending: Gastroenterology | Admitting: Gastroenterology

## 2016-03-12 DIAGNOSIS — K862 Cyst of pancreas: Secondary | ICD-10-CM

## 2016-03-12 DIAGNOSIS — Z800 Family history of malignant neoplasm of digestive organs: Secondary | ICD-10-CM | POA: Insufficient documentation

## 2016-03-13 ENCOUNTER — Telehealth (HOSPITAL_BASED_OUTPATIENT_CLINIC_OR_DEPARTMENT_OTHER): Payer: Self-pay

## 2016-03-13 NOTE — Telephone Encounter (Signed)
Patient placed on the recall list to schedule in one year. Patient will receive a letter in the mail regarding scheduling.

## 2016-03-13 NOTE — Telephone Encounter (Signed)
Post procedure follow up.  Dr Tye Savoy recommends the following:    Post Proc Diagnostic Conclusion: 1. 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 Otherwise normal.  Recommendations: 1. Repeat EUS in one year

## 2016-03-31 ENCOUNTER — Encounter (INDEPENDENT_AMBULATORY_CARE_PROVIDER_SITE_OTHER): Payer: Self-pay | Admitting: Internal Medicine

## 2016-04-01 ENCOUNTER — Other Ambulatory Visit (HOSPITAL_BASED_OUTPATIENT_CLINIC_OR_DEPARTMENT_OTHER): Payer: Self-pay

## 2016-04-01 ENCOUNTER — Encounter (INDEPENDENT_AMBULATORY_CARE_PROVIDER_SITE_OTHER): Payer: Self-pay | Admitting: Internal Medicine

## 2016-04-02 ENCOUNTER — Encounter (INDEPENDENT_AMBULATORY_CARE_PROVIDER_SITE_OTHER): Payer: Self-pay | Admitting: Surgery

## 2016-04-02 DIAGNOSIS — Z9884 Bariatric surgery status: Secondary | ICD-10-CM

## 2016-04-03 ENCOUNTER — Encounter (INDEPENDENT_AMBULATORY_CARE_PROVIDER_SITE_OTHER): Payer: Self-pay | Admitting: Internal Medicine

## 2016-04-08 ENCOUNTER — Encounter (INDEPENDENT_AMBULATORY_CARE_PROVIDER_SITE_OTHER): Payer: Self-pay

## 2016-04-10 ENCOUNTER — Ambulatory Visit (INDEPENDENT_AMBULATORY_CARE_PROVIDER_SITE_OTHER): Payer: Commercial Managed Care - HMO | Admitting: Surgery

## 2016-04-10 ENCOUNTER — Ambulatory Visit (INDEPENDENT_AMBULATORY_CARE_PROVIDER_SITE_OTHER): Payer: Commercial Managed Care - HMO

## 2016-04-12 LAB — PTH, INTACT: PARATHYROID HORMONE INTACT: 38 pg/mL (ref 14–64)

## 2016-04-12 LAB — VITAMIN B1, PLASMA: Vitamin B1 (Thiamine): 16 nmol/L (ref 8–30)

## 2016-04-13 ENCOUNTER — Encounter (INDEPENDENT_AMBULATORY_CARE_PROVIDER_SITE_OTHER): Payer: Self-pay | Admitting: Surgery

## 2016-04-15 LAB — COMPREHENSIVE METABOLIC PANEL
ALT: 11 U/L (ref 6–29)
AST (SGOT): 14 U/L (ref 10–35)
Albumin/Globulin Ratio: 1.6 (ref 1.0–2.5)
Albumin: 4.2 G/DL (ref 3.6–5.1)
Alkaline Phosphatase: 116 U/L (ref 33–130)
BUN: 11 MG/DL (ref 7–25)
Bilirubin, Total: 0.4 MG/DL (ref 0.2–1.2)
CO2: 24 mmol/L (ref 20–31)
Calcium: 9.9 MG/DL (ref 8.6–10.4)
Chloride: 105 mmol/L (ref 98–110)
Creatinine: 0.58 mg/dL (ref 0.50–0.99)
EGFR African American: 114 mL/min/{1.73_m2} (ref >=60)
EGFR: 98 mL/min/{1.73_m2} (ref >=60)
Globulin: 2.6 G/DL (ref 1.9–3.7)
Glucose: 91 MG/DL (ref 65–99)
Potassium: 3.8 mmol/L (ref 3.5–5.3)
Protein, Total: 6.8 G/DL (ref 6.1–8.1)
Sodium: 144 mmol/L (ref 135–146)

## 2016-04-15 LAB — VITAMIN B12: Vitamin B-12: 878 pg/mL (ref 200–1100)

## 2016-04-15 LAB — CBC
Hematocrit: 41 % (ref 35.0–45.0)
Hemoglobin: 13.4 g/dL (ref 11.7–15.5)
MCH: 28 pg (ref 27–33)
MCHC: 32.7 g/dL (ref 32–36)
MCV: 86 fL (ref 80–100)
MPV: 8.6 fL (ref 7.5–12.5)
Platelets: 263 10*3/uL (ref 140–400)
RBC: 4.8 10*6/uL (ref 3.80–5.10)
RDW: 16.7 % — ABNORMAL HIGH (ref 11.0–15.0)
WBC: 5.4 10*3/uL (ref 3.8–10.8)

## 2016-04-15 LAB — LIPID PANEL
Cholesterol / HDL Ratio: 2.8 (calc) (ref 0.0–5.0)
Cholesterol: 168 mg/dL (ref 125–200)
HDL: 61 mg/dL (ref >=46)
LDL Calculated: 93 mg/dL (ref <130)
Non HDL Cholesterol (LDL and VLDL): 107 mg/dL
Triglycerides: 68 mg/dL (ref <150)

## 2016-04-15 LAB — VITAMIN A: Vitamin A: 34 ug/dL — ABNORMAL LOW (ref 38–98)

## 2016-04-15 LAB — IRON PROFILE
Iron Saturation: 19 % (ref 11–50)
Iron: 62 ug/dL (ref 45–160)
TIBC: 333 ug/dl (ref 250–450)

## 2016-04-15 LAB — HEMOGLOBIN A1C: Hemoglobin A1C: 5.4 % of total Hgb (ref <5.7)

## 2016-04-15 LAB — FOLATE: Folate: >24 ng/mL (ref >5.4)

## 2016-04-15 LAB — VITAMIN D-25 HYDROXY (D2/D3/TOTAL)
25-Hydroxy D2: 7 ng/mL
Vitamin D, 25-OH, D3: 40 ng/mL
Vitamin D, 25-OH, Total: 47 ng/mL (ref 30–100)

## 2016-04-15 LAB — COPPER, SERUM: Copper: 145 ug/dL (ref 70–175)

## 2016-04-15 LAB — TSH: TSH: 1.24 mIU/L (ref 0.40–4.50)

## 2016-04-17 ENCOUNTER — Ambulatory Visit (INDEPENDENT_AMBULATORY_CARE_PROVIDER_SITE_OTHER): Payer: Commercial Managed Care - HMO | Admitting: Surgery

## 2016-04-17 ENCOUNTER — Encounter (INDEPENDENT_AMBULATORY_CARE_PROVIDER_SITE_OTHER): Payer: Self-pay | Admitting: Surgery

## 2016-04-17 ENCOUNTER — Ambulatory Visit (INDEPENDENT_AMBULATORY_CARE_PROVIDER_SITE_OTHER): Payer: Commercial Managed Care - HMO

## 2016-04-17 VITALS — BP 156/92 | HR 66 | Temp 97.6°F | Ht 61.0 in | Wt 162.1 lb

## 2016-04-17 DIAGNOSIS — Z9884 Bariatric surgery status: Secondary | ICD-10-CM

## 2016-04-17 DIAGNOSIS — E66811 Obesity, class 1: Secondary | ICD-10-CM

## 2016-04-17 DIAGNOSIS — Z713 Dietary counseling and surveillance: Secondary | ICD-10-CM

## 2016-04-17 DIAGNOSIS — M170 Bilateral primary osteoarthritis of knee: Secondary | ICD-10-CM

## 2016-04-17 DIAGNOSIS — I10 Essential (primary) hypertension: Secondary | ICD-10-CM

## 2016-04-17 DIAGNOSIS — E669 Obesity, unspecified: Secondary | ICD-10-CM

## 2016-04-17 DIAGNOSIS — E639 Nutritional deficiency, unspecified: Secondary | ICD-10-CM | POA: Insufficient documentation

## 2016-04-17 DIAGNOSIS — E559 Vitamin D deficiency, unspecified: Secondary | ICD-10-CM

## 2016-04-17 NOTE — Progress Notes (Signed)
Pt presents for her  6  month post gastric bypass. Pt states She feels great overall. Pt states she is happy with her weight.      Nutrition:    Eating Portions/ Pace: 5-6 ounces  Food Tolerances/Intolerances: none  Chewing food well: yes  Eating Routine Meals: yes  30/30 Rule: Continues to  Self-monitoring: yes; measuring frequently    Physical Activity:walking (20-25 minutes/day); no resistance  Vitamin/mineral Supplements:B-complex (1x/day); Essential Multis (2x/day); 1 Iron/day (BA our brand); 2 calcium Citrates/day; B-12 1x/week  Protein Supplements:54 grams protein/serving;  BA HPMR Drink      Fluid:most more than 64 oz fluid   N/V/C/D: Pt reports no issues with constipation, reports was admitted to hospital due to impaction.    O:  Today's Wt:      Previous Visit Wt:    Wt Readings from Last 10 Encounters:   04/17/16 162 lb 1.9 oz   03/11/16 162 lb   01/16/16 182 lb 1.9 oz   01/02/16 186 lb   01/02/16 186 lb 6.4 oz   12/19/15 189 lb 0.6 oz   12/13/15 189 lb 9.6 oz   11/07/15 203 lb 0.6 oz   10/22/15 210 lb   10/15/15 213 lb 0.6 oz     BMI:  There is no weight on file to calculate BMI.  Total Wt Loss:  51 pounds from highest weight (10/15/15)  Surgery Date:  10/23/15     Weight Change from previous month: 20 pounds (7 pounds/month)  Pertinent Lab Values:    Component      Latest Ref Rng 04/09/2016   Vitamin A      38 - 98 mcg/dL 34 (L)     Allergies:    Allergies   Allergen Reactions   . Acyclovir Nausea And Vomiting   . Amoxicillin-Pot Clavulanate      Other reaction(s): gi distress   . Aspirin Nausea And Vomiting     Other reaction(s): gi distress  Upsets stomach  Able to tolerate enteric coated aspirin   . Atorvastatin      Palpitation   . Lovastatin Nausea And Vomiting   . Metronidazole Nausea And Vomiting   . Moxifloxacin Nausea And Vomiting     GI symptoms   . Other Nausea And Vomiting   . Rosuvastatin      Palpitation, chest pain, abd pain    . Statins    . Sulfa Antibiotics Nausea And  Vomiting     Other reaction(s): gi distress       Diet Recall:   Breakfast: 5 ounces  Low-Sugar Oatmeal   Lunch:  Broccoli, baked potato (plain, no essential fat), pieces of steak   Dinner:  Same as dinner   Snacks:   Beverages:   Alcohol: none    Pt with the resolving condition of obesity with limited adherence to nutrition related recommendations as evidenced by report (inadequate physical activity); recall (inadeqate protein intake; inappropriate portions), and slowed rate of weight loss (7 pounds/month).    Nutrition Intervention Included: Encouraged pt to measure portions and reviewed appropriate portion size for stage: 4-6oz (protein/ produce/ and then whole grains).Encouraged pt to monitor physical activity, aim for 150 min/week min to include resistance; encouraged pt to increase fluid intake to 80 ounces/day ; encouraged essential fats to help maximize vit A absorption (con't with Essential mutli's add 5,000 IU qd dry vit a additional).  P:  1. Pt to monitor intake and measure portions (current portion size 6 oz;  protein/ produce/ whole grain)  2. Pt to continue using protein rich foods/supplements for snacks at this time.  3. Con't with all vitamin/mineral supplements to include adjusting Vit A.  4. Pt to f/u in 3 months or PRN.     Spent a total of 15 minutes educating pt in a individual one-on-one setting

## 2016-04-17 NOTE — Progress Notes (Signed)
Assessment:  Status Post Laparoscopic RNY-Gastric Bypass      Plan:  1.  Patient is to continue with portion control  2.  Patient is to increase exercise.  3.  Patient is to continue with supplements.  4.  Patient is to follow up with PCP as needed.  5.  Patient is to follow up in 3 month(s) with labs          Subjective:  [name] returns for follow up 6 month(s) post laparoscopic RNY-Gastric Bypass. She is doing well with 56 lbs. total weight loss. She is tolerating a regular diet and denies any nausea, vomiting, reflux or abdominal pain.  She is compliant with portion control and vitamin supplementation. Her energy level is good. She is walking regularly.  Lab work was reviewed.    Objective:  The following portions of the patient's history were reviewed and updated as appropriate: allergies, current medications, past family history, past medical history, past social history, past surgical history and problem list.    General appearance: alert, appears stated age and cooperative  Head: Normocephalic, without obvious abnormality, atraumatic  Eyes: negative findings: conjunctivae and sclerae normal  Neck: no adenopathy, no carotid bruit, no JVD, supple, symmetrical, trachea midline and thyroid not enlarged, symmetric, no tenderness/mass/nodules  Lungs: clear to auscultation bilaterally  Heart: regular rate and rhythm, S1, S2 normal, no murmur, click, rub or gallop  Abdomen: soft, non-tender; bowel sounds normal; no masses,  no organomegaly  Extremities: extremities normal, atraumatic, no cyanosis or edema and Homans sign is negative, no sign of DVT      Auto Release Encounter on 04/02/2016   Component Date Value Ref Range Status   . Vitamin A 04/09/2016 34* 38 - 98 mcg/dL Final    Comment: Please note:  =Y                         Vitamin supplementation within 24 hours prior to  blood draw may affect the accuracy of the results.  This test was developed and its analytical  performance  characteristics have been determined by [facility] [place], [place]. It has  not been cleared or approved by the U.S. Food and Drug  Administration. This assay has been validated pursuant  to the CLIA regulations and is used for clinical  purposes.     . Vitamin B1 (Thiamine) 04/09/2016 16  8 - 30 nmol/L Final    Comment: Vitamin supplementation within 24 hours prior to  blood draw may affect the accuracy of the results.  This test was developed and its analytical performance  characteristics have been determined by [facility] [place], [place]. It has  not been cleared or approved by the U.S. Food and Drug  Administration. This assay has been validated pursuant  to the CLIA regulations and is used for clinical  purposes.     . Cholesterol 04/09/2016 168  125 - 200 mg/dL Final   . Triglycerides 04/09/2016 68  <150 mg/dL Final    Comment: Fasting reference interval  Analysis performed on aliquotted specimen. CO2 may be decreased  due to greater exposure of specimen to air.  For patients >43 years of age, the reference limit  for Creatinine is approximately 13% higher for people  identified as African-American.     Marland Kitchen HDL 04/09/2016 61  > or = 46 mg/dL Final   . LDL Calculated 04/09/2016 93  <130 mg/dL  Final    Comment: Desirable range <100 mg/dL for patients with CHD or  diabetes and < 70 mg/dL for diabetic patients with  known heart disease.     . CHOL/HDL Ratio 04/09/2016 2.8  0.0 - 5.0 (calc) Final   . Non HDL Chol. (LDL+VLDL) 04/09/2016 107   Final    Comment: Target for non-HDL cholesterol is 30 mg/dL higher than  LDL cholesterol target.     . Folate 04/09/2016 >24.0  >5.4 ng/mL Final   . WBC 04/09/2016 5.4  3.8 - 10.8 Thousand/uL Final   . RBC 04/09/2016 4.80  3.80 - 5.10 Million/uL Final   . Hemoglobin 04/09/2016 13.4  11.7 - 15.5 g/dL Final   . Hematocrit 16/08/9603 41.0  35.0 - 45.0 % Final   . MCV 04/09/2016 86  80 - 100 fL Final   . MCH 04/09/2016  28.0  27 - 33 pg Final   . MCHC 04/09/2016 32.7  32 - 36 g/dL Final   . Platelets 54/07/8118 263  140 - 400 Thousand/uL Final   . RDW 04/09/2016 16.7* 11.0 - 15.0 % Final   . MPV 04/09/2016 8.6  7.5 - 12.5 fL Final   . Sodium 04/09/2016 144  135 - 146 mmol/L Final   . Potassium 04/09/2016 3.8  3.5 - 5.3 mmol/L Final   . Chloride 04/09/2016 105  98 - 110 mmol/L Final   . CO2 04/09/2016 24  20 - 31 mmol/L Final   . Glucose 04/09/2016 91  65 - 99 MG/DL Final   . BUN 14/78/2956 11  7 - 25 MG/DL Final   . Creatinine 21/30/8657 0.58  0.50 - 0.99 mg/dL Final   . BUN/Creatinine Ratio 04/09/2016 N/A  6 - 22 Final    Comment: Bun/Creatinine ratio is not reported when the BUN  and creatinine values are within normal limits.     . Calcium 04/09/2016 9.9  8.6 - 10.4 MG/DL Final   . Protein, Total 04/09/2016 6.8  6.1 - 8.1 G/DL Final   . Albumin 84/69/6295 4.2  3.6 - 5.1 G/DL Final   . Globulin 28/41/3244 2.6  1.9 - 3.7 G/DL Final   . Albumin/Globulin Ratio 04/09/2016 1.6  1.0 - 2.5 Final   . Bilirubin, Total 04/09/2016 0.4  0.2 - 1.2 MG/DL Final   . AST (SGOT) 11/04/7251 14  10 - 35 U/L Final   . ALT 04/09/2016 11  6 - 29 U/L Final   . Alkaline Phosphatase 04/09/2016 116  33 - 130 U/L Final    Comment: Fasting reference interval  Analysis performed on aliquotted specimen. CO2 may be decreased  due to greater exposure of specimen to air.  For patients >34 years of age, the reference limit  for Creatinine is approximately 13% higher for people  identified as African-American.     Marland Kitchen EGFR 04/09/2016 98  > OR = 60 mL/min/1.31m2 Final   . AFRICAN AMERICAN EGFR 04/09/2016 114  > OR = 60 mL/min/1.24m2 Final   . Vitamin B-12 04/09/2016 878  200 - 1100 pg/mL Final   . Hemoglobin A1C 04/09/2016 5.4  <5.7 % of total Hgb Final    Comment: For the purpose of screening for the presence of diabetes:  <5.7%      Consistent with the absence of diabetes    5.7-6.4 %  Consistent with increased risk for diabetes (prediabetes)  > 6.5 %    Consistent  with diabetes  This assay result is consistent with a decreased  risk of diabetes.  Currently, no consensus exists regarding use of hemoglobin A1C for  diagnosis of diabetes in children.  According to American Diabetes Association (ADA) guidelines,   hemoglobin A1C <7.0% represents optimal control in non-pregnant   diabetic patients. Different metrics may apply to specific patient   populations.  Standards of Medical Care in Diabetes (ADA).     Marland Kitchen [place]pper 04/09/2016 145  70 - 175 mcg/dL Final    Comment: This test was developed and its analytical performance  characteristics have been determined by [facility] [place], [place]. It has  not been cleared or approved by the U.S. Food and Drug  Administration. This assay has been validated pursuant  to the CLIA regulations and is used for clinical  purposes.     Marland Kitchen PARATHYROID HORMONE INTACT 04/09/2016 38  14 - 64 pg/mL Final    Comment:     Interpretive Guide    Intact PTH           Calcium      ------------------    ----------           -------      Normal Parathyroid    Normal               Normal      Hypoparathyroidism    Low or Low Normal    Low      Hyperparathyroidism          Primary           Normal or High       High          Secondary         High                 Normal or Low          Tertiary          High                 High      Non-Parathyroid          Hypercalcemia     Low or Low Normal    High     . Iron 04/09/2016 62  45 - 160 mcg/dL Final   . TIBC 81/19/1478 333  250 - 450 ug/dl Final   . Iron Saturation 04/09/2016 19  11 - 50 % Final   . Vitamin D, 25-OH, Total 04/09/2016 47  30 - 100 ng/mL Final   . Vitamin D, 25-OH, D3 04/09/2016 40   Final   . 25-Hydroxy D2 04/09/2016 7   Final    Comment: 25-OHD3 indicates both endogenous production and  supplementation. 25-OHD2 is an indicator of  exogenous sources such as diet or supplementation.  Therapy is based on measurement of Total 25-OHD,  with levels <20 ng/mL indicative of Vitamin  D  deficiency while levels between 20 ng/mL and 30  ng/mL suggest insufficiency. Optimal levels are  > or = 30 ng/mL.  For more information on this test, go to  http://education.[facility].com/faq/FAQ163     . TSH 04/09/2016 1.24  0.40 - 4.50 mIU/L Final    Comment: TSH Pregnancy Reference Ranges:      First Trimester:  0.26-2.66 mIU/L      Second Trimester: 0.55-2.73 mIU/L      Third Trimester:  0.43-2.91 mIU/L     Admission on 03/11/2016, Discharged on 03/11/2016   Component Date Value Ref Range Status   .  WBC 03/11/2016 5.63  3.50 - 10.80 x10 3/uL Final   . Hgb 03/11/2016 14.3  12.0 - 16.0 g/dL Final   . Hematocrit 28/41/3244 42.4  37.0 - 47.0 % Final   . Platelets 03/11/2016 249  140 - 400 x10 3/uL Final   . RBC 03/11/2016 4.96  4.20 - 5.40 x10 6/uL Final   . MCV 03/11/2016 85.5  80.0 - 100.0 fL Final   . MCH 03/11/2016 28.8  28.0 - 32.0 pg Final   . MCHC 03/11/2016 33.7  32.0 - 36.0 g/dL Final   . RDW 11/04/7251 16* 12 - 15 % Final   . MPV 03/11/2016 10.1  9.4 - 12.3 fL Final   . Neutrophils 03/11/2016 54  None % Final   . Lymphocytes Automated 03/11/2016 35  None % Final   . Monocytes 03/11/2016 10  None % Final   . Eosinophils Automated 03/11/2016 1  None % Final   . Basophils Automated 03/11/2016 0  None % Final   . Immature Granulocyte 03/11/2016 Unmeasured  None % Final   . Nucleated RBC 03/11/2016 Unmeasured  0 - 1 /100 WBC Final   . Neutrophils Absolute 03/11/2016 3.02  1.80 - 8.10 x10 3/uL Final   . Abs Lymph Automated 03/11/2016 1.96  0.50 - 4.40 x10 3/uL Final   . Abs Mono Automated 03/11/2016 0.57  0.00 - 1.20 x10 3/uL Final   . Abs Eos Automated 03/11/2016 0.07  0.00 - 0.70 x10 3/uL Final   . Absolute Baso Automated 03/11/2016 0.01  0.00 - 0.20 x10 3/uL Final   . Absolute Immature Granulocyte 03/11/2016 Unmeasured  0 x10 3/uL Final   . Glucose 03/11/2016 93  70 - 100 mg/dL Final    Comment: ADA guidelines for diabetes mellitus:  Fasting:  Equal to or greater than 126 mg/dL  Random:   Equal to  or greater than 200 mg/dL     . BUN 03/11/2016 14.0  7.0 - 19.0 mg/dL Final   . Creatinine 66/44/0347 0.7  0.6 - 1.0 mg/dL Final   . Sodium 42/59/5638 140  136 - 145 mEq/L Final   . Potassium 03/11/2016 4.2  3.5 - 5.1 mEq/L Final   . Chloride 03/11/2016 103  100 - 111 mEq/L Final   . CO2 03/11/2016 22  22 - 29 mEq/L Final   . Calcium 03/11/2016 9.9  8.5 - 10.5 mg/dL Final   . Protein, Total 03/11/2016 6.5  6.0 - 8.3 g/dL Final   . Albumin 75/64/3329 4.0  3.5 - 5.0 g/dL Final   . AST (SGOT) 51/88/4166 17  5 - 34 U/L Final   . ALT 03/11/2016 16  0 - 55 U/L Final   . Alkaline Phosphatase 03/11/2016 119* 37 - 106 U/L Final   . Bilirubin, Total 03/11/2016 0.4  0.2 - 1.2 mg/dL Final   . Globulin 05/01/1600 2.5  2.0 - 3.6 g/dL Final   . Albumin/Globulin Ratio 03/11/2016 1.6  0.9 - 2.2 Final   . Anion Gap 03/11/2016 15.0  5.0 - 15.0 Final   . EGFR 03/11/2016 >60.0   Final    Comment: Disease State Reference Ranges:    Chronic Kidney Disease; < 60 ml/min/1.73 sq.m    Kidney Failure; < 15 ml/min/1.73 sq.m    [Calculated using IDMS-Traceable MDRD equation (based on    gender, age and black vs. non-black race) recommended by    [employer] Disease Education Program. No data    available for non-white, non-black race.]  GFR estimates are unreliable in patients with:    Rapidly changing kidney function or recent dialysis,    extreme age, body size or body composition(obesity,    severe malnutrition). Abnormal muscle mass (limb    amputation, muscle wasting). In these patients,    alternative determinations of GFR should be obtained.         No results found..         No results found.    Takeem Krotzer R. Nada Godley, DO, FACS, FASMBS, FACOS

## 2016-04-24 ENCOUNTER — Ambulatory Visit (INDEPENDENT_AMBULATORY_CARE_PROVIDER_SITE_OTHER): Payer: Commercial Managed Care - HMO | Admitting: Internal Medicine

## 2016-04-24 ENCOUNTER — Encounter (INDEPENDENT_AMBULATORY_CARE_PROVIDER_SITE_OTHER): Payer: Self-pay | Admitting: Internal Medicine

## 2016-04-24 VITALS — BP 134/81 | HR 81 | Temp 97.5°F | Resp 12 | Ht 61.0 in | Wt 162.0 lb

## 2016-04-24 DIAGNOSIS — Z9884 Bariatric surgery status: Secondary | ICD-10-CM

## 2016-04-24 DIAGNOSIS — K5900 Constipation, unspecified: Secondary | ICD-10-CM

## 2016-04-24 DIAGNOSIS — I10 Essential (primary) hypertension: Secondary | ICD-10-CM

## 2016-04-24 NOTE — Progress Notes (Signed)
Subjective:      Patient ID: Amber [name] is a 65 y.o. female     Chief Complaint   Patient presents with   . Constipation     f/u visit   . bp medication        HPI  Pt c/o  1. c/o constipation with diltiazem, gotten worse since gastric bypass surgery.  s/p colonoscopy last month +benign polyps. taking colace, miralax. GI specialist recommends changing diltiazem.  2. HTN, Home BP 100-110/70-80, diltiazem was initally rx'd for palpitations several yrs ago. No current palpitations.   3. S/p gastric bypass, has lost total 57lbs since surgery, taking vitamins.     The following portions of the patient's history were reviewed and updated as appropriate: allergies, current medications, past family history, past medical history, past social history, past surgical history and problem list.    Review of Systems   Constitutional: Negative for fever, chills and appetite change.   Respiratory: Negative for shortness of breath.    Cardiovascular: Negative for chest pain and palpitations.   Gastrointestinal: Positive for constipation. Negative for nausea, vomiting, abdominal pain and diarrhea.   Neurological: Negative for syncope, light-headedness and headaches.          BP 134/81 mmHg  Pulse 81  Temp(Src) 97.5 F (36.4 C) (Oral)  Resp 12  Ht 1.549 m (5\' 1" )  Wt 73.483 kg (162 lb)  BMI 30.63 kg/m2     Objective:     Physical Exam   Constitutional: She appears well-developed. No distress.   HENT:   Mouth/Throat: Oropharynx is clear and moist. No oropharyngeal exudate.   Eyes: Conjunctivae are normal. No scleral icterus.   Neck: Neck supple. No JVD present. Carotid bruit is not present. No thyromegaly present.   Cardiovascular: Normal rate, regular rhythm and normal heart sounds.  Exam reveals no gallop and no friction rub.    No murmur heard.  Pulmonary/Chest: Effort normal and breath sounds normal. No respiratory distress. She has no wheezes. She has no rales.   Abdominal: Soft. Bowel sounds are normal. She exhibits  no distension. There is no tenderness. There is no rebound and no guarding.   Musculoskeletal: She exhibits no edema.   Neurological: She is alert.          Assessment:     1. Constipation, unspecified constipation type    2. Essential hypertension    3. S/P gastric bypass       Plan:     Taper diltiazem off over next week  Monitor BP and HR  Continue other meds, vitamins  F/u 3 months or sooner prn.     Shyloh Krinke Karie Kirks, MD

## 2016-04-24 NOTE — Progress Notes (Signed)
1. Have you self referred yourself since we last saw you? NO    Refer to care team   Or  Add specialists:

## 2016-05-23 ENCOUNTER — Emergency Department: Payer: Commercial Managed Care - HMO

## 2016-05-23 ENCOUNTER — Emergency Department
Admission: EM | Admit: 2016-05-23 | Discharge: 2016-05-23 | Disposition: A | Discharge status: Home / Self Care | Payer: Commercial Managed Care - HMO | Attending: Emergency Medicine | Admitting: Emergency Medicine

## 2016-05-23 DIAGNOSIS — E785 Hyperlipidemia, unspecified: Secondary | ICD-10-CM | POA: Insufficient documentation

## 2016-05-23 DIAGNOSIS — K219 Gastro-esophageal reflux disease without esophagitis: Secondary | ICD-10-CM | POA: Insufficient documentation

## 2016-05-23 DIAGNOSIS — M79622 Pain in left upper arm: Secondary | ICD-10-CM | POA: Insufficient documentation

## 2016-05-23 DIAGNOSIS — Z6841 Body Mass Index (BMI) 40.0 and over, adult: Secondary | ICD-10-CM | POA: Insufficient documentation

## 2016-05-23 DIAGNOSIS — I10 Essential (primary) hypertension: Secondary | ICD-10-CM | POA: Insufficient documentation

## 2016-05-23 DIAGNOSIS — Z8673 Personal history of transient ischemic attack (TIA), and cerebral infarction without residual deficits: Secondary | ICD-10-CM | POA: Insufficient documentation

## 2016-05-23 DIAGNOSIS — G4733 Obstructive sleep apnea (adult) (pediatric): Secondary | ICD-10-CM | POA: Insufficient documentation

## 2016-05-23 DIAGNOSIS — Z79899 Other long term (current) drug therapy: Secondary | ICD-10-CM | POA: Insufficient documentation

## 2016-05-23 MED ORDER — DIAZEPAM 2 MG PO TABS
2.0000 mg | ORAL_TABLET | Freq: Two times a day (BID) | ORAL | Status: DC | PRN
Start: 2016-05-23 — End: 2016-06-25

## 2016-05-23 NOTE — ED Provider Notes (Signed)
EMERGENCY DEPARTMENT NOTE    Physician/Midlevel provider first contact with patient: 05/23/16 1336         HISTORY OF PRESENT ILLNESS   Historian:Patient  Translator Used: No    Chief Complaint: Arm Injury     Mechanism of Injury: left arm bent back behind her      65 y.o. female presents to the ed with left upper arm pain. Denies chest pain or sob. Denies neck pain. Pt states a few months ago she was taking trash out and trash bin fell and arm got bent behind her. She has been having intermittent left arm pain since then. States last night pain got worse. States it hurts to lift stuff with left arm. denies numbness or tingling of arm. Denies arm weakness. Pt is right hand dominant.        1. Location of symptoms: left upper arm  2. Onset of symptoms: few months ago  3. What was patient doing when symptoms started (Context): see above  4. Severity: moderate  5. Timing: intermittent  6. Activities that worsen symptoms: moving left arm  7. Activities that improve symptoms: resting left arm  8. Quality: sore  9. Radiation of symptoms: no  10. Associated signs and Symptoms: see above  11. Are symptoms worsening? yes  MEDICAL HISTORY     Past Medical History:  Past Medical History   Diagnosis Date   . GERD (gastroesophageal reflux disease)    . Hiatal hernia    . Genital herpes      no current outbreak (10/03/15)   . Hyperlipidemia    . Osteoarthritis of knees, bilateral    . Nausea without vomiting    . OSA on CPAP 2015     CPAP nightly x 1 yr   . Difficulty walking      amb with cane secondary to arthritis bil knees   . Morbid obesity with BMI of 40.0-44.9, adult      BMI 40.9   . TIA (transient ischemic attack) 2014     TIA 2014-no residual. Patient evaulated for possible TIA 07/12/2015  @ [facility] (dischage summary in epic)-per summary CT of head was normal and patient was d/c'd   . Hypertensive disorder      Well controlled on med. Denies any SOB in last 6 months (10/03/15)   . Chest pain 2016      Chest pain after  eating x6  months--being followed by GI for GERD   . Constipation        Past Surgical History:  Past Surgical History   Procedure Laterality Date   . Ovary surgery Right >25 yrs   . Fallopian tube surgery  >25 yrs   . Laparoscopic, cholecystectomy, cholangiogram  08/13/2014   . Appendectomy  >25 yrs   . Spine surgery  11/2013     cervical HNP x 2, Dr. Bufford Buttner   . Egd  01/2015   . Laparoscopic, gastric bypass N/A 10/22/2015     Procedure: LAPAROSCOPIC, GASTRIC BYPASS;  Surgeon: Josefa Half R, DO;  Location: [place] [facility];  Service: General;  Laterality: N/A;   . Insertion, pain pump (medical) N/A 10/22/2015     Procedure: INSERTION, PAIN PUMP (MEDICAL);  Surgeon: Nicola Police, DO;  Location: [place] [facility];  Service: General;  Laterality: N/A;   . Laparoscopic, herniorrhaphy, hiatal N/A 10/22/2015     Procedure: LAPAROSCOPIC, HERNIORRHAPHY, HIATAL;  Surgeon: Josefa Half R, DO;  Location: [place] [facility];  Service: General;  Laterality: N/A;       Social History:  Social History     Social History   . Marital Status: Single     Spouse Name: N/A   . Number of Children: 2   . 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 Smoking status: Never Smoker    . Smokeless tobacco: Never Used   . Alcohol Use: No   . Drug Use: No   . Sexual Activity: No     Other Topics Concern   . Dietary Supplements / Vitamins Yes   . Anesthesia Problems No   . Blood Thinners No   . Eats Large Amounts No   . Excessive Sweets No   . Skips Meals No   . Eats Excessive Starches Yes   . Snacks Or Grazes No   . Emotional Eater Yes   . Eats Fried Food Yes   . Eats Fast Food Yes   . Diet Center No   . Hmr No   . [name] No   . La Weight Loss No   . Nutri-System No   . Opti-Fast / Medi-Fast No   . Overeaters Anonymous No   . [facility] Weight Loss Center No   . Tops No   . Weight Watchers No   . Atkins No   . Binging / Purging No   . Body For Life No   .  Cabbage Soup No   . Calorie Counting No   . Fasting No   . [name] No   . Health Spa No   . Herbal Life No   . High Protein No   . Low Carb No   . Low Fat No   . [facility] Diet No   . [place] Diet No   . Margie Billet Diet No   . [place] Diet No   . Slim Fast No   . [place] No   . Sugar Busters No   . Vomiting No   . Zone Diet No   . Stationary Cycle Or Treadmill No   . Gym/Fitness Classes No   . Home Exercise/Video No   . Swimming No   . Team Sports No   . Weight Training No   . Walking Or Running No   . Hospitalization No   . Hypnosis No   . Physical Therapy No   . Psychological Therapy No   . Residential Program No   . Acutrim No   . Amphetamines No   . Anorex No   . Byetta No   . Dexatrim No   . Didrex No   . Fastin No   . Fen - Phen No   . Ionamin / Adipex No   . Mazanor No   . Meridia No   . Obalan No   . Phendiet No   . Phentrol No   . Phenteramine Yes   . Plegine No   . Pondimin No   . Qsymia No   . Prozac No   . Redux No   . Sanorex No   . [place] No   . Tepanole No   . Wechless No   . Wellbutrin No   . Xenical (Orlistat, Alli) No   . Other Med No   . No Impairment Yes     Chronic Bilat Knee Pain   .  Walks With Cane/Crutch Yes   . Requires A Wheelchair No   . Bedridden No   . Are You Currently Being Treated For Depression? No   . Do You Snore? Yes   . Are You Receiving Any Medical Or Psychological Services? No   . Do You Have Or Have You Been Treated For An Eating Disorder? No   . Do You Exercise Regularly? No   . Have You Or Family Member Ever Have Trouble With Anesthesia? No     Social History Narrative       Family History:  Family History   Problem Relation Age of Onset   . Cancer Mother 74     breast cancer       Outpatient Medication:  Previous Medications    ALBUTEROL (PROVENTIL HFA;VENTOLIN HFA) 108 (90 BASE) MCG/ACT INHALER    Inhale into the lungs every 4 (four) hours as needed.       ATENOLOL (TENORMIN) 50 MG TABLET    TAKE 1 TABLET(50 MG) BY MOUTH DAILY    DESONIDE (DESOWEN)  0.05 % CREAM    Apply topically 2 (two) times daily as needed.    DOCUSATE SODIUM (COLACE) 100 MG CAPSULE    Take 100 mg by mouth daily.        FLUTICASONE (FLONASE) 50 MCG/ACT NASAL SPRAY    2 sprays by Nasal route daily.    LIDOCAINE (XYLOCAINE) 2 % JELLY    Apply topically as needed.    PANTOPRAZOLE (PROTONIX) 40 MG TABLET    Take 40 mg by mouth daily.    POLYETHYLENE GLYCOL (MIRALAX) PACKET    Take 17 g by mouth daily as needed.    VALACYCLOVIR HCL (VALTREX) 500 MG TABLET    Take 1 tablet (500 mg total) by mouth daily.         REVIEW OF SYSTEMS   Review of Systems   Respiratory: Negative for shortness of breath.    Cardiovascular: Negative for chest pain.   Musculoskeletal: Negative for neck pain.        Left upper arm pain   Neurological: Negative for tingling and focal weakness.   All other systems reviewed and are negative.      PHYSICAL EXAM   ED Triage Vitals   Enc Vitals Group      BP 05/23/16 1340 144/83 mmHg      Heart Rate 05/23/16 1340 77      Resp Rate 05/23/16 1340 16      Temp 05/23/16 1340 97.8 F (36.6 C)      Temp Source 05/23/16 1340 Oral      SpO2 05/23/16 1340 98 %      Weight 05/23/16 1340 68.947 kg      Height 05/23/16 1340 1.549 m      Head Cir --       Peak Flow --       Pain Score 05/23/16 1340 10      Pain Loc --       Pain Edu? --       Excl. in GC? --      Nursing note and vitals reviewed.   Constitutional:  No acute distress.  Head:  Atraumatic.  Normocephalic.   NECK: no tenderness to palpation of c-spine. Full rom of neck  Eyes:  Normal sclera.  PERRL.    ENT:  Moist mucosa.     Cardiovascular:  Well perfused.  Equal pulses.  Regular rate.  Normal capillary refill.  Pulmonary/Chest:  No respiratory distress.  Airway patent.  No tachypnea.  No accessory muscle usage.    Abdominal:  Non-distended.    Extremities:  No peripheral edema. Moves all extremities equally. Left shoulder: Tenderness to palpation left posterior shoulder.  Pain with abduction and flexion of left shoulder.    pain on palpation of the left upper arm.  Full range of motion of left elbow.  Full range of motion left forearm with pronation and supination without difficulty.  +2 out of 4 radial pulse, left wrist.  +5 out of 5 grip strength of the left hand  Skin:  Normal color. No cyanosis or pallor.  Neurological:  Alert, awake, and appropriate.  Normal speech.    Psychological: Normal affect.      MEDICAL DECISION MAKING   Symptoms consistent with left upper arm strain.   Differential diagnosis includes shoulder strain or thoracic strain.  Instructed to take medications as prescribed.  Instructed to wear sling for support.  Instructed to return for worsening symptoms.  Patient agrees with the plan        Splint applied by technician, and checked by EP. Splint in good position. NV intact      DISCUSSION          Vital Signs: Reviewed the patient?s vital signs.   Nursing Notes: Reviewed and utilized available nursing notes.  Medical Records Reviewed: Reviewed available past medical records.  Counseling: The emergency provider has spoken with the patient and discussed today?s findings, in addition to providing specific details for the plan of care.  Questions are answered and there is agreement with the plan.      CARDIAC STUDIES    The following cardiac studies were independently interpreted by the Emergency Medicine Physician.  For full cardiac study results please see chart.    Monitor Strip  Interpreted by ED Physician  Rate: 77  Rhythm: NSR   ST Changes: none    EKG Interpretation:  Signed and interpreted byED Physician   Time Interpreted: 1343  Comparison: none  Rate: 71  Rhythm: NSR  Axis: normal  Intervals: normal  Blocks: none  ST segments: nml- t wave inversion in III  Interpretation: Nonspecific  EKG    EMERGENCY IMAGING STUDIES    The following imagine studies were independently interpreted by me (emergency physician):        RADIOLOGY IMAGING STUDIES      No orders to display           PULSE OXIMETRY    Oxygen  Saturation by Pulse Oximetry: 98%  Interventions: none  Interpretation: nml    EMERGENCY DEPT. MEDICATIONS      ED Medication Orders     None          LABORATORY RESULTS    Ordered and independently interpreted AVAILABLE laboratory tests. Please see results section in chart for full details.  Results for orders placed or performed during the hospital encounter of 05/23/16   ECG 12 lead   Result Value Ref Range    Ventricular Rate 71 BPM    Atrial Rate 71 BPM    P-R Interval 164 ms    QRS Duration 78 ms    Q-T Interval 384 ms    QTC Calculation (Bezet) 417 ms    P Axis 38 degrees    R Axis -4 degrees    T Axis 23 degrees       CRITICAL CARE/PROCEDURES        DIAGNOSIS  Diagnosis:  Final diagnoses:   Left upper arm pain       Disposition:  ED Disposition     Discharge DINISHA CAI discharge to home/self care.    Condition at disposition: Stable            Prescriptions:  Patient's Medications   New Prescriptions    DIAZEPAM (VALIUM) 2 MG TABLET    Take 1 tablet (2 mg total) by mouth every 12 (twelve) hours as needed (muscle spasm).   Previous Medications    ALBUTEROL (PROVENTIL HFA;VENTOLIN HFA) 108 (90 BASE) MCG/ACT INHALER    Inhale into the lungs every 4 (four) hours as needed.       ATENOLOL (TENORMIN) 50 MG TABLET    TAKE 1 TABLET(50 MG) BY MOUTH DAILY    DESONIDE (DESOWEN) 0.05 % CREAM    Apply topically 2 (two) times daily as needed.    DOCUSATE SODIUM (COLACE) 100 MG CAPSULE    Take 100 mg by mouth daily.        FLUTICASONE (FLONASE) 50 MCG/ACT NASAL SPRAY    2 sprays by Nasal route daily.    LIDOCAINE (XYLOCAINE) 2 % JELLY    Apply topically as needed.    PANTOPRAZOLE (PROTONIX) 40 MG TABLET    Take 40 mg by mouth daily.    POLYETHYLENE GLYCOL (MIRALAX) PACKET    Take 17 g by mouth daily as needed.    VALACYCLOVIR HCL (VALTREX) 500 MG TABLET    Take 1 tablet (500 mg total) by mouth daily.   Modified Medications    No medications on file   Discontinued Medications    ASPIRIN EC 81 MG EC TABLET    Take  81 mg by mouth daily.    DILTIAZEM (CARDIZEM) 60 MG TABLET    Take 1 tablet (60 mg total) by mouth 3 (three) times daily.           Loetta Rough, DO  05/23/16 1410

## 2016-05-23 NOTE — Discharge Instructions (Signed)
Arm Pain    You have been seen for arm pain.    Many things can cause arm pain. Most of the causes are not dangerous and will get better on their own. These can be muscle cramps, bruises, strains, pinched nerves, and minor skin infections for example.    You had an exam by your doctor today. He or she decided that the cause of your arm pain does not seem to be serious or dangerous.    If your pain continues, you might need another exam or more tests. This is to find out why you have arm pain. The cause of your symptoms doesn t seem dangerous now. You don t need to stay in the hospital.    Though we don't believe your condition is dangerous right now, it is important to be careful. Sometimes a problem that seems mild can become serious later. This is why it is very important that you return here or go to the nearest Emergency Department if you are not improving or your symptoms are getting worse.    Some things you may try at home are:    Over-the-counter pain medications that have ibuprofen (Advil/Motrin) or acetaminophen (Tylenol) in them. Follow the directions on the package.   Rest the arm as needed. As soon as you are able, start moving your arm again to keep it from getting stiff.    Return here or go to the nearest Emergency Department or follow-up with your doctor if you are not getting better as expected.    Follow the instructions for any medication you are prescribed.     YOU SHOULD SEEK MEDICAL ATTENTION IMMEDIATELY, EITHER HERE OR AT THE NEAREST EMERGENCY DEPARTMENT, IF ANY OF THE FOLLOWING HAPPENS:     You have a fever (temperature higher than 100.4F or 38C).   Your pain does not go away or gets worse.   Your arm or joints (elbow, wrist, shoulder, etc.) get red or swollen.   Your chest hurts or you get short of breath.   You have any other symptoms or concerns, or don t get better as expected.    If you can t follow up with your doctor, or if at any time you feel you need to be  rechecked or seen again, come back here or go to the nearest emergency department.

## 2016-05-23 NOTE — ED Notes (Signed)
Patient in no apparent distress @ d/c. RR unlabored, regular, skin warm and appropriate for skin tone and patient alert and oriented x4. Patient educated on d/c instructions. Patient had no questions or concerns. Patient ambulated to waiting room with steady gait and no assistance.

## 2016-05-23 NOTE — ED Notes (Signed)
Amber [name] is a 65 y.o. female who presents to the ER for left upper arm pain. She reports an injury to that same arm several months ago however the pain had resolved at that time. She is unsure if this paint is an exacerbation of that injury. She reports approx 1 week ago her arm began to hurt again and now she is also experiencing some swelling. Denies recent fall or injury. No obvious deformity, no bruising. She reports pain is currently 10/10 and describes it as "aching" constantly. She has tried ice with no relief. Took Tylenol 500mg  last night - she reports the Tylenol helps for a little bit but then the pain returns. She is unable to lift anything heavy with that arm due to the pain.

## 2016-05-24 LAB — ECG 12-LEAD
Atrial Rate: 71 {beats}/min
Atrial Rate: 71 {beats}/min
P Axis: 38 degrees
P Axis: 38 degrees
P-R Interval: 164 ms
P-R Interval: 164 ms
Q-T Interval: 384 ms
Q-T Interval: 384 ms
QRS Duration: 78 ms
QRS Duration: 78 ms
QTC Calculation (Bezet): 417 ms
QTC Calculation (Bezet): 417 ms
R Axis: -4 degrees
R Axis: -4 degrees
T Axis: 23 degrees
T Axis: 23 degrees
Ventricular Rate: 71 {beats}/min
Ventricular Rate: 71 {beats}/min

## 2016-06-01 ENCOUNTER — Other Ambulatory Visit (INDEPENDENT_AMBULATORY_CARE_PROVIDER_SITE_OTHER): Payer: Self-pay | Admitting: Internal Medicine

## 2016-06-03 ENCOUNTER — Telehealth (INDEPENDENT_AMBULATORY_CARE_PROVIDER_SITE_OTHER): Payer: Self-pay | Admitting: Internal Medicine

## 2016-06-03 ENCOUNTER — Other Ambulatory Visit (INDEPENDENT_AMBULATORY_CARE_PROVIDER_SITE_OTHER): Payer: Self-pay | Admitting: Internal Medicine

## 2016-06-03 MED ORDER — METOPROLOL SUCCINATE ER 50 MG PO TB24
50.0000 mg | ORAL_TABLET | Freq: Every day | ORAL | Status: DC
Start: 2016-06-03 — End: 2016-06-25

## 2016-06-03 NOTE — Telephone Encounter (Signed)
rx atenolol changed to metoprolol ER 50mg  daily as requested.   rx sent to pharmacy

## 2016-06-03 NOTE — Telephone Encounter (Signed)
Pharmacy called TENORMIN) 50 is on back order, please change

## 2016-06-03 NOTE — Telephone Encounter (Signed)
See below

## 2016-06-24 ENCOUNTER — Other Ambulatory Visit (HOSPITAL_BASED_OUTPATIENT_CLINIC_OR_DEPARTMENT_OTHER): Payer: Self-pay

## 2016-06-24 ENCOUNTER — Encounter (INDEPENDENT_AMBULATORY_CARE_PROVIDER_SITE_OTHER): Payer: Self-pay | Admitting: Internal Medicine

## 2016-06-24 ENCOUNTER — Emergency Department: Payer: Commercial Managed Care - HMO

## 2016-06-24 ENCOUNTER — Emergency Department
Admission: EM | Admit: 2016-06-24 | Discharge: 2016-06-24 | Disposition: A | Discharge status: Home / Self Care | Payer: Commercial Managed Care - HMO | Attending: Emergency Medicine | Admitting: Emergency Medicine

## 2016-06-24 DIAGNOSIS — Z79899 Other long term (current) drug therapy: Secondary | ICD-10-CM | POA: Insufficient documentation

## 2016-06-24 DIAGNOSIS — R531 Weakness: Secondary | ICD-10-CM | POA: Insufficient documentation

## 2016-06-24 DIAGNOSIS — R002 Palpitations: Secondary | ICD-10-CM | POA: Insufficient documentation

## 2016-06-24 DIAGNOSIS — K219 Gastro-esophageal reflux disease without esophagitis: Secondary | ICD-10-CM | POA: Insufficient documentation

## 2016-06-24 DIAGNOSIS — K648 Other hemorrhoids: Secondary | ICD-10-CM

## 2016-06-24 DIAGNOSIS — K644 Residual hemorrhoidal skin tags: Secondary | ICD-10-CM | POA: Insufficient documentation

## 2016-06-24 DIAGNOSIS — Z9884 Bariatric surgery status: Secondary | ICD-10-CM | POA: Insufficient documentation

## 2016-06-24 DIAGNOSIS — I10 Essential (primary) hypertension: Secondary | ICD-10-CM | POA: Insufficient documentation

## 2016-06-24 DIAGNOSIS — Z9049 Acquired absence of other specified parts of digestive tract: Secondary | ICD-10-CM | POA: Insufficient documentation

## 2016-06-24 LAB — CBC AND DIFFERENTIAL
Absolute NRBC: 0 10*3/uL
Basophils Absolute Automated: 0.04 10*3/uL (ref 0.00–0.20)
Basophils Automated: 0.7 %
Eosinophils Absolute Automated: 0.09 10*3/uL (ref 0.00–0.70)
Eosinophils Automated: 1.6 %
Hematocrit: 39.8 % (ref 37.0–47.0)
Hgb: 13.3 g/dL (ref 12.0–16.0)
Immature Granulocytes Absolute: 0.01 10*3/uL
Immature Granulocytes: 0.2 %
Lymphocytes Absolute Automated: 2.08 10*3/uL (ref 0.50–4.40)
Lymphocytes Automated: 35.9 %
MCH: 28.2 pg (ref 28.0–32.0)
MCHC: 33.4 g/dL (ref 32.0–36.0)
MCV: 84.3 fL (ref 80.0–100.0)
MPV: 10 fL (ref 9.4–12.3)
Monocytes Absolute Automated: 0.52 10*3/uL (ref 0.00–1.20)
Monocytes: 9 %
Neutrophils Absolute: 3.06 10*3/uL (ref 1.80–8.10)
Neutrophils: 52.6 %
Nucleated RBC: 0 /100 WBC (ref 0.0–1.0)
Platelets: 274 10*3/uL (ref 140–400)
RBC: 4.72 10*6/uL (ref 4.20–5.40)
RDW: 14 % (ref 12–15)
WBC: 5.8 10*3/uL (ref 3.50–10.80)

## 2016-06-24 LAB — COMPREHENSIVE METABOLIC PANEL
ALT: 13 U/L (ref 0–55)
AST (SGOT): 17 U/L (ref 5–34)
Albumin/Globulin Ratio: 1.4 (ref 0.9–2.2)
Albumin: 4.1 g/dL (ref 3.5–5.0)
Alkaline Phosphatase: 111 U/L — ABNORMAL HIGH (ref 37–106)
Anion Gap: 14 (ref 5.0–15.0)
BUN: 15 mg/dL (ref 7.0–19.0)
Bilirubin, Total: 0.4 mg/dL (ref 0.2–1.2)
CO2: 24 mEq/L (ref 22–29)
Calcium: 10.1 mg/dL (ref 8.5–10.5)
Chloride: 105 mEq/L (ref 100–111)
Creatinine: 0.7 mg/dL (ref 0.6–1.0)
Globulin: 2.9 g/dL (ref 2.0–3.6)
Glucose: 79 mg/dL (ref 70–100)
Potassium: 3.6 mEq/L (ref 3.5–5.1)
Protein, Total: 7 g/dL (ref 6.0–8.3)
Sodium: 143 mEq/L (ref 136–145)

## 2016-06-24 LAB — TROPONIN I: Troponin I: <0.01 ng/mL (ref 0.00–0.09)

## 2016-06-24 LAB — MAGNESIUM: Magnesium: 2.2 mg/dL (ref 1.6–2.6)

## 2016-06-24 LAB — LIPASE: Lipase: 9 U/L (ref 8–78)

## 2016-06-24 LAB — GFR: EGFR: >60

## 2016-06-24 MED ORDER — SODIUM CHLORIDE 0.9 % IV BOLUS
1000.0000 mL | Freq: Once | INTRAVENOUS | Status: AC
Start: 2016-06-24 — End: 2016-06-24
  Administered 2016-06-24: 1000 mL via INTRAVENOUS

## 2016-06-24 NOTE — ED Notes (Signed)
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 Pt report palpable swelling to rectum with "something hanging out". Pt also reports some general malaise and palpitations.

## 2016-06-24 NOTE — ED Notes (Signed)
Pt alert, awake and oriented x 4, resps even even and unlabored. Denies any chest pain or sob, no nausea or vomiting.

## 2016-06-24 NOTE — Discharge Instructions (Signed)
Hemorrhoids    You have been diagnosed with hemorrhoids.    Hemorrhoids are swollen veins around the anus (rectum). Veins are blood vessels that carry blood to the heart. Straining to move the bowels causes most hemorrhoids. Hemorrhoids often happen during pregnancy. They may also be a complication of liver disease. Hemorrhoids can be external (outside the rectum) or internal (inside the rectum). External hemorrhoids may feel like a lump near the anus. Some hemorrhoids have blood clots inside; these are very painful. You may see red blood on your toilet paper, in the toilet bowl or on your stool (poop).    Hemorrhoids are often treated with stool softeners, which make bowel movements easier. Your doctor may recommend a cream or suppository (medicine put in the rectum) to help with swelling. Your doctor may also recommend pain medicine. Use all medicines as prescribed.    Take a hot "sitz bath" for at least 15 minutes, 3-4 times a day and after each bowel movement to help the pain. It will also help to keep the anus clean. Keeping the area clean is VERY IMPORTANT to help the hemorrhoid heal.   TO TAKE A SITZ BATH: Fill the bath tub with a few inches of hot water. The water SHOULD NOT be so hot that it causes discomfort. Sit in the water. Briskly swish water against your anus to clean off any stool around the area. This will also soothe the irritated skin. Do this for about 15 minutes.    Increase the fiber or bulk in your diet. Choose foods high in fiber like fruits, vegetables and whole grain breads. Your doctor may also recommend a fiber supplement.   Don't sit on the toilet too long. No reading or relaxing!   Do not strain (push too hard)    Follow up as directed. You may be referred to a surgeon for further evaluation of your hemorrhoids.    YOU SHOULD SEEK MEDICAL ATTENTION IMMEDIATELY, EITHER HERE OR AT THE NEAREST EMERGENCY DEPARTMENT, IF ANY OF THE FOLLOWING OCCURS:   The pain suddenly gets  worse.   Repeated vomiting (throwing up), or vomiting blood or material that looks like coffee grounds.   Your stool has blood, gets very dark or looks like tar.              Palpitations    You have been diagnosed with "palpitations."    Palpitations are beats in the chest that feel funny or strange. They are often caused by extra heartbeats that come earlier than normal. These are either "premature atrial contractions" or "premature ventricular contractions," depending on where in the heart they happen. Palpitations often go away on their own and often cause no serious problems. They are sometimes related to stress or lack of sleep. They can also be related too much caffeine. These symptoms can be caused by many over-the-counter (no prescription needed) cold medicines, diet pills and "natural" vitamin supplements with stimulants, often ephedrine (Ephedrine is also known by its traditional [place] name, Ma huang).    Palpitations feel different for different people. Some patients describe a feeling of "butterflies" in the chest. Others say it feels like the heart is "flipping over" in the chest. Palpitations may happen often but should last only a second or two each time. They should not cause any chest pain, lightheadedness, dizziness or fainting.    There is no specific treatment for palpitations. However, you should avoid all caffeine and cold medications. Also avoid all natural stimulants and  chocolate.    Follow up with your primary doctor in the next week to make sure your symptoms are getting better. In some cases, a Holter monitor may be ordered. A Holter monitor is a portable heart monitor. It records your heart s electrical rhythm. You will need to see your regular doctor to get the results from the test.    YOU SHOULD SEEK MEDICAL ATTENTION IMMEDIATELY, EITHER HERE OR AT THE NEAREST EMERGENCY DEPARTMENT, IF ANY OF THE FOLLOWING OCCURS:   Lightheadedness or the feeling you might faint.   An  unusually fast or slow heart rate.   Chest pain or shortness of breath.   Palpitations increase when you exercise.   Any other worsening symptoms or concerns.

## 2016-06-25 ENCOUNTER — Ambulatory Visit (INDEPENDENT_AMBULATORY_CARE_PROVIDER_SITE_OTHER): Payer: Commercial Managed Care - HMO | Admitting: Internal Medicine

## 2016-06-25 ENCOUNTER — Encounter (INDEPENDENT_AMBULATORY_CARE_PROVIDER_SITE_OTHER): Payer: Self-pay | Admitting: Internal Medicine

## 2016-06-25 VITALS — BP 162/89 | HR 68 | Temp 98.3°F | Resp 16 | Wt 151.0 lb

## 2016-06-25 DIAGNOSIS — F419 Anxiety disorder, unspecified: Secondary | ICD-10-CM | POA: Insufficient documentation

## 2016-06-25 DIAGNOSIS — I10 Essential (primary) hypertension: Secondary | ICD-10-CM

## 2016-06-25 DIAGNOSIS — R002 Palpitations: Secondary | ICD-10-CM

## 2016-06-25 LAB — ECG 12-LEAD
Atrial Rate: 64 {beats}/min
Atrial Rate: 64 {beats}/min
P Axis: 34 degrees
P Axis: 34 degrees
P-R Interval: 150 ms
P-R Interval: 150 ms
Q-T Interval: 406 ms
Q-T Interval: 406 ms
QRS Duration: 74 ms
QRS Duration: 74 ms
QTC Calculation (Bezet): 418 ms
QTC Calculation (Bezet): 418 ms
R Axis: -9 degrees
R Axis: -9 degrees
T Axis: 14 degrees
T Axis: 14 degrees
Ventricular Rate: 64 {beats}/min
Ventricular Rate: 64 {beats}/min

## 2016-06-25 LAB — TSH: TSH: 2 u[IU]/mL (ref 0.35–4.94)

## 2016-06-25 MED ORDER — ATENOLOL 50 MG PO TABS
ORAL_TABLET | ORAL | Status: DC
Start: 2016-06-25 — End: 2017-02-05

## 2016-06-25 MED ORDER — LORAZEPAM 0.5 MG PO TABS
0.5000 mg | ORAL_TABLET | Freq: Three times a day (TID) | ORAL | Status: DC | PRN
Start: 2016-06-25 — End: 2016-10-30

## 2016-06-25 NOTE — Progress Notes (Signed)
1. Have you self referred yourself since we last saw you? No  Refer to care team   Or   Add specialists:

## 2016-06-25 NOTE — Progress Notes (Signed)
Subjective:      Patient ID: Amber [name] is a 65 y.o. female     Chief Complaint   Patient presents with   . ER F/u   . Medication Management     Pt started having palpations when she started taking the Metoprolol        HPI   Pt is here for follow up   1. Palpitations, recurred after switching to metoprolol when atenolol was on back order,   Now back on atenolol but palpitations persistent, no syncope, +lightheaded, +nausea, no vomiting, no cp. Pt described palpitations as constant.   Went to ED last night, EKG normal , labs normal including TSH,   Reported previous Holter was normal, last saw Dr. Katrinka Blazing in 11/16  2. HTN, home BP 120-130's/80-90's, HR 60-80's., off diltiazem, constipation better.     The following portions of the patient's history were reviewed and updated as appropriate: allergies, current medications, past family history, past medical history, past social history, past surgical history and problem list.    Review of Systems   Constitutional: Negative for fever, chills and appetite change.   Respiratory: Negative for shortness of breath.    Cardiovascular: Positive for palpitations. Negative for chest pain.   Gastrointestinal: Negative for nausea, vomiting and abdominal pain.   Genitourinary: Negative for dysuria.   Neurological: Negative for syncope, light-headedness and headaches.   Psychiatric/Behavioral: Negative for sleep disturbance and dysphoric mood. The patient is nervous/anxious.           BP 162/89 mmHg  Pulse 68  Temp(Src) 98.3 F (36.8 C) (Oral)  Resp 16  Wt 68.493 kg (151 lb)     Objective:     Physical Exam   Constitutional: She appears well-developed. No distress.   HENT:   Mouth/Throat: Oropharynx is clear and moist. No oropharyngeal exudate.   Eyes: Conjunctivae are normal. No scleral icterus.   Neck: Neck supple. No JVD present. Carotid bruit is not present. No thyromegaly present.   Cardiovascular: Normal rate, regular rhythm and normal heart sounds.  Exam reveals no  gallop and no friction rub.    No murmur heard.  Pulmonary/Chest: Effort normal and breath sounds normal. No respiratory distress. She has no wheezes. She has no rales.   Abdominal: Soft. Bowel sounds are normal. She exhibits no distension. There is no tenderness. There is no rebound and no guarding.   Musculoskeletal: She exhibits no edema.   Neurological: She is alert.         Assessment:     1. Essential hypertension  - atenolol (TENORMIN) 50 MG tablet; TAKE 1 and 1/2 TABLET BY MOUTH DAILY.  Dispense: 140 tablet; Refill: 1    2. Palpitations  - atenolol (TENORMIN) 50 MG tablet; TAKE 1 and 1/2 TABLET BY MOUTH DAILY.  Dispense: 140 tablet; Refill: 1    3. Anxiety  - LORazepam (ATIVAN) 0.5 MG tablet; Take 1 tablet (0.5 mg total) by mouth every 8 (eight) hours as needed for Anxiety.  Dispense: 30 tablet; Refill: 1       Plan:     Increase atenolol to 75mg  daily  rx ativan prn  F/u card, Dr. Katrinka Blazing for further evaluation  Monitor BP  F/u 4 weeks.     Sabastian Raimondi Karie Kirks, MD

## 2016-06-25 NOTE — ED Provider Notes (Signed)
EMERGENCY DEPARTMENT HISTORY AND PHYSICAL EXAM    Date Time: 06/25/2016 5:49 AM  Patient Name: Amber [name], Amber [name], 65 y.o., female  ED Provider: Sonda Primes, MD    History of Presenting Illness:     Chief Complaint: Rectal swelling  History obtained from: Patient.  Onset/Duration: few days ago  Quality: Swelling, pressure  Severity: No pain  Aggravating Factors: None  Alleviating Factors: None  Associated Symptoms: Has also been feeling tired and weak lately, also complains of palpitations at times  Narrative/Additional Historical Findings:Amber [name] is a 65 y.o. female  who is presenting with the above chief complaint.  She has a history of gastric bypass surgery.  She reports that she takes laxatives every day, to keep her stools regular.  She denies any abdominal pain, fevers, chills.  She denies any chest pain or shortness of breath.    Nursing notes from this date of service were reviewed.    Past Medical History:     Past Medical History   Diagnosis Date   . GERD (gastroesophageal reflux disease)    . Hiatal hernia    . Genital herpes      no current outbreak (10/03/15)   . Hyperlipidemia    . Osteoarthritis of knees, bilateral    . Nausea without vomiting    . OSA on CPAP 2015     CPAP nightly x 1 yr   . Difficulty walking      amb with cane secondary to arthritis bil knees   . Morbid obesity with BMI of 40.0-44.9, adult      BMI 40.9   . TIA (transient ischemic attack) 2014     TIA 2014-no residual. Patient evaulated for possible TIA 07/12/2015  @ [facility] (dischage summary in epic)-per summary CT of head was normal and patient was d/c'd   . Hypertensive disorder      Well controlled on med. Denies any SOB in last 6 months (10/03/15)   . Chest pain 2016      Chest pain after eating x6  months--being followed by GI for GERD   . Constipation        Past Surgical History:     Past Surgical History   Procedure Laterality Date   . Ovary surgery Right >25 yrs   . Fallopian tube surgery  >25 yrs   . Laparoscopic,  cholecystectomy, cholangiogram  08/13/2014   . Appendectomy  >25 yrs   . Spine surgery  11/2013     cervical HNP x 2, Dr. Bufford Buttner   . Egd  01/2015   . Laparoscopic, gastric bypass N/A 10/22/2015     Procedure: LAPAROSCOPIC, GASTRIC BYPASS;  Surgeon: Josefa Half R, DO;  Location: [place] [facility];  Service: General;  Laterality: N/A;   . Insertion, pain pump (medical) N/A 10/22/2015     Procedure: INSERTION, PAIN PUMP (MEDICAL);  Surgeon: Nicola Police, DO;  Location: [place] [facility];  Service: General;  Laterality: N/A;   . Laparoscopic, herniorrhaphy, hiatal N/A 10/22/2015     Procedure: LAPAROSCOPIC, HERNIORRHAPHY, HIATAL;  Surgeon: Nicola Police, DO;  Location: [place] [facility];  Service: General;  Laterality: N/A;       Family History:     Family History   Problem Relation Age of Onset   . Cancer Mother 32     breast cancer       Social History:     Social History     Social History   .  Marital Status: Single     Spouse Name: N/A   . Number of Children: 2   . 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 Smoking status: Never Smoker    . Smokeless tobacco: Never Used   . Alcohol Use: No   . Drug Use: No   . Sexual Activity: No     Other Topics Concern   . Dietary Supplements / Vitamins Yes   . Anesthesia Problems No   . Blood Thinners No   . Eats Large Amounts No   . Excessive Sweets No   . Skips Meals No   . Eats Excessive Starches Yes   . Snacks Or Grazes No   . Emotional Eater Yes   . Eats Fried Food Yes   . Eats Fast Food Yes   . Diet Center No   . Hmr No   . [name] No   . La Weight Loss No   . Nutri-System No   . Opti-Fast / Medi-Fast No   . Overeaters Anonymous No   . Physicians Weight Loss Center No   . Tops No   . Weight Watchers No   . Atkins No   . Binging / Purging No   . Body For Life No   . Cabbage Soup No   . Calorie Counting No   . Fasting No   . [name] No   . Health Spa No   . Herbal Life No   .  High Protein No   . Low Carb No   . Low Fat No   . Mayo [facility] Diet No   . [place] Diet No   . Margie Billet Diet No   . [place] Diet No   . Slim Fast No   . [place] No   . Sugar Busters No   . Vomiting No   . Zone Diet No   . Stationary Cycle Or Treadmill No   . Gym/Fitness Classes No   . Home Exercise/Video No   . Swimming No   . Team Sports No   . Weight Training No   . Walking Or Running No   . Hospitalization No   . Hypnosis No   . Physical Therapy No   . Psychological Therapy No   . Residential Program No   . Acutrim No   . Amphetamines No   . Anorex No   . Byetta No   . Dexatrim No   . Didrex No   . Fastin No   . Fen - Phen No   . Ionamin / Adipex No   . Mazanor No   . Meridia No   . Obalan No   . Phendiet No   . Phentrol No   . Phenteramine Yes   . Plegine No   . Pondimin No   . Qsymia No   . Prozac No   . Redux No   . Sanorex No   . Tenuate No   . Tepanole No   . Wechless No   . Wellbutrin No   . Xenical (Orlistat, Alli) No   . Other Med No   . No Impairment Yes     Chronic Bilat Knee Pain   . Walks With Cane/Crutch Yes   . Requires A Wheelchair No   . Bedridden No   . Are You Currently  Being Treated For Depression? No   . Do You Snore? Yes   . Are You Receiving Any Medical Or Psychological Services? No   . Do You Have Or Have You Been Treated For An Eating Disorder? No   . Do You Exercise Regularly? No   . Have You Or Family Member Ever Have Trouble With Anesthesia? No     Social History Narrative       Allergies:     Allergies   Allergen Reactions   . Acyclovir Nausea And Vomiting   . Amoxicillin-Pot Clavulanate      Other reaction(s): gi distress   . Aspirin Nausea And Vomiting     Other reaction(s): gi distress  Upsets stomach  Able to tolerate enteric coated aspirin   . Atorvastatin      Palpitation   . Lovastatin Nausea And Vomiting   . Metronidazole Nausea And Vomiting   . Moxifloxacin Nausea And Vomiting     GI symptoms   . Other Nausea And Vomiting   . Rosuvastatin      Palpitation, chest  pain, abd pain    . Statins    . Sulfa Antibiotics Nausea And Vomiting     Other reaction(s): gi distress       Medications:   No current facility-administered medications for this encounter.    Current outpatient prescriptions:   .  desonide (DESOWEN) 0.05 % cream, Apply topically 2 (two) times daily as needed., Disp: 60 g, Rfl: 2  .  diazePAM (VALIUM) 2 MG tablet, Take 1 tablet (2 mg total) by mouth every 12 (twelve) hours as needed (muscle spasm)., Disp: 6 tablet, Rfl: 0  .  docusate sodium (COLACE) 100 MG capsule, Take 100 mg by mouth daily.  , Disp: , Rfl:   .  fluticasone (FLONASE) 50 MCG/ACT nasal spray, 2 sprays by Nasal route daily. (Patient taking differently: 2 sprays by Nasal route as needed.  ), Disp: 16 g, Rfl: 2  .  lidocaine (XYLOCAINE) 2 % jelly, Apply topically as needed., Disp: 30 mL, Rfl: 1  .  metoprolol XL (TOPROL-XL) 50 MG 24 hr tablet, Take 1 tablet (50 mg total) by mouth daily., Disp: 90 tablet, Rfl: 0  .  pantoprazole (PROTONIX) 40 MG tablet, Take 40 mg by mouth daily., Disp: , Rfl:   .  polyethylene glycol (MIRALAX) packet, Take 17 g by mouth daily as needed. (Patient taking differently: 17 g every other day.  ), Disp: 5 packet, Rfl: 0  .  valACYclovir HCL (VALTREX) 500 MG tablet, Take 1 tablet (500 mg total) by mouth daily. (Patient taking differently: Take 500 mg by mouth as needed.  ), Disp: 90 tablet, Rfl: 1  .  albuterol (PROVENTIL HFA;VENTOLIN HFA) 108 (90 BASE) MCG/ACT inhaler, Inhale into the lungs every 4 (four) hours as needed.  , Disp: , Rfl:     Review of Systems:   Constitutional: No fever or change in activity.  Eyes: No eye redness. No eye discharge.  ENT: No ear pain or sore throat  Cardiovascular: Positive for palpitations  Respiratory: No cough or shortness of breath.  GI: No vomiting or diarrhea.  Genitourinary: Normal urination frequency  Musculoskeletal: No extremity pain or decreased use  Skin: no rash or skin lesions.  Neurologic: Normal level of alertness    All  other systems reviewed and are negative    Physical Exam:   ED Triage Vitals   Enc Vitals Group      BP 06/24/16  2125 177/83 mmHg      Heart Rate 06/24/16 2125 69      Resp Rate 06/24/16 2125 18      Temp 06/24/16 2125 97.9 F (36.6 C)      Temp Source 06/24/16 2125 Oral      SpO2 06/24/16 2125 98 %      Weight 06/24/16 2125 64.411 kg      Height 06/24/16 2125 1.549 m      Head Cir --       Peak Flow --       Pain Score 06/24/16 2315 6      Pain Loc --       Pain Edu? --       Excl. in GC? --      Constitutional: Vital signs reviewed. Well hydrated, well perfused, and no increased work of breathing. Appearance:No apparent distress  Head:  Normocephalic, atraumatic  Eyes: No conjunctival injection. No discharge. EOMI  ENT: Mucous membranes moist, No oral lesions.  Neck: Normal range of motion. Non-tender.  Respiratory/Chest: Clear to auscultation. No respiratory distress.   Cardiovascular: Regular rate and rhythm. No murmur.   Abdomen: Soft and non-tender. No masses or hepatosplenomegaly.  External hemorrhoid, at 6 PM, no active bleeding  Genitourinary:  UpperExtremity: No edema or cyanosis.  Moving well.  LowerExtremity: No edema or cyanosis.  Moving well.  Neurological: No focal motor deficits by observation. Speech normal. Memory normal.  Skin: Warm and dry. No rash.  Lymphatic: No cervical lymphadenopathy.  Psychiatric: Normal affect. Normal concentration.    Labs:     Labs Reviewed   COMPREHENSIVE METABOLIC PANEL - Abnormal; Notable for the following:     Alkaline Phosphatase 111 (*)     All other components within normal limits   CBC AND DIFFERENTIAL   TROPONIN I   MAGNESIUM   LIPASE   TSH   GFR         Rads:     Radiology Results (24 Hour)     ** No results found for the last 24 hours. **          MDM and ED Course   DR. Tresean Mattix  is the primary attending for this patient and has obtained and performed the history, PE, and medical decision making for this patient.    MDM:    EKG Interpretation:   Signed  and interpreted by ED Physician   Time Interpreted: 2142  Comparison: none  Rate: 64  Rhythm: sinus   Axis: normal   Intervals: normal  Blocks: none  ST segments: Nonspecific ST changes  Interpretation: Normal EKG    Labs are within normal limits, patient reports improvement in symptoms after IV fluids.    Advised follow-up with gastrointestinal doctor for external hemorrhoids and advised symptomatic care.    Assessment/Plan:   Results and instructions reviewed at the bedside with patient and family.    Clinical Impression  Final diagnoses:   Palpitations   Weakness   Other hemorrhoids       Disposition  ED Disposition     Discharge KATELAN HIRT discharge to home/self care.    Condition at disposition: Stable            Prescriptions  Discharge Medication List as of 06/24/2016 11:34 PM              Signed by: Vito Backers, MD  06/28/16 1344

## 2016-07-03 ENCOUNTER — Encounter (INDEPENDENT_AMBULATORY_CARE_PROVIDER_SITE_OTHER): Payer: Self-pay | Admitting: Surgery

## 2016-07-03 DIAGNOSIS — E639 Nutritional deficiency, unspecified: Secondary | ICD-10-CM

## 2016-07-03 DIAGNOSIS — E559 Vitamin D deficiency, unspecified: Secondary | ICD-10-CM

## 2016-07-03 DIAGNOSIS — E669 Obesity, unspecified: Secondary | ICD-10-CM

## 2016-07-03 DIAGNOSIS — M170 Bilateral primary osteoarthritis of knee: Secondary | ICD-10-CM

## 2016-07-03 DIAGNOSIS — Z9884 Bariatric surgery status: Secondary | ICD-10-CM

## 2016-07-03 DIAGNOSIS — E66811 Obesity, class 1: Secondary | ICD-10-CM

## 2016-07-03 DIAGNOSIS — I10 Essential (primary) hypertension: Secondary | ICD-10-CM

## 2016-07-08 ENCOUNTER — Encounter (INDEPENDENT_AMBULATORY_CARE_PROVIDER_SITE_OTHER): Payer: Self-pay | Admitting: Internal Medicine

## 2016-07-24 ENCOUNTER — Emergency Department: Payer: Commercial Managed Care - HMO

## 2016-07-24 ENCOUNTER — Emergency Department
Admission: EM | Admit: 2016-07-24 | Discharge: 2016-07-24 | Disposition: A | Discharge status: Home / Self Care | Payer: Commercial Managed Care - HMO | Attending: Emergency Medicine | Admitting: Emergency Medicine

## 2016-07-24 DIAGNOSIS — Z9049 Acquired absence of other specified parts of digestive tract: Secondary | ICD-10-CM | POA: Insufficient documentation

## 2016-07-24 DIAGNOSIS — K219 Gastro-esophageal reflux disease without esophagitis: Secondary | ICD-10-CM | POA: Insufficient documentation

## 2016-07-24 DIAGNOSIS — G4733 Obstructive sleep apnea (adult) (pediatric): Secondary | ICD-10-CM | POA: Insufficient documentation

## 2016-07-24 DIAGNOSIS — Z9989 Dependence on other enabling machines and devices: Secondary | ICD-10-CM | POA: Insufficient documentation

## 2016-07-24 DIAGNOSIS — Z7951 Long term (current) use of inhaled steroids: Secondary | ICD-10-CM | POA: Insufficient documentation

## 2016-07-24 DIAGNOSIS — I10 Essential (primary) hypertension: Secondary | ICD-10-CM | POA: Insufficient documentation

## 2016-07-24 DIAGNOSIS — Z886 Allergy status to analgesic agent status: Secondary | ICD-10-CM | POA: Insufficient documentation

## 2016-07-24 DIAGNOSIS — Z79899 Other long term (current) drug therapy: Secondary | ICD-10-CM | POA: Insufficient documentation

## 2016-07-24 DIAGNOSIS — R1030 Lower abdominal pain, unspecified: Secondary | ICD-10-CM | POA: Insufficient documentation

## 2016-07-24 DIAGNOSIS — Z6841 Body Mass Index (BMI) 40.0 and over, adult: Secondary | ICD-10-CM | POA: Insufficient documentation

## 2016-07-24 LAB — COMPREHENSIVE METABOLIC PANEL
ALT: 17 U/L (ref 0–55)
AST (SGOT): 19 U/L (ref 5–34)
Albumin/Globulin Ratio: 1.4 (ref 0.9–2.2)
Albumin: 3.9 g/dL (ref 3.5–5.0)
Alkaline Phosphatase: 117 U/L — ABNORMAL HIGH (ref 37–106)
Anion Gap: 8 (ref 5.0–15.0)
BUN: 11 mg/dL (ref 7.0–19.0)
Bilirubin, Total: 0.4 mg/dL (ref 0.2–1.2)
CO2: 26 mEq/L (ref 22–29)
Calcium: 9.8 mg/dL (ref 8.5–10.5)
Chloride: 106 mEq/L (ref 100–111)
Creatinine: 0.7 mg/dL (ref 0.6–1.0)
Globulin: 2.7 g/dL (ref 2.0–3.6)
Glucose: 104 mg/dL — ABNORMAL HIGH (ref 70–100)
Potassium: 3.8 mEq/L (ref 3.5–5.1)
Protein, Total: 6.6 g/dL (ref 6.0–8.3)
Sodium: 140 mEq/L (ref 136–145)

## 2016-07-24 LAB — CBC AND DIFFERENTIAL
Absolute NRBC: 0 10*3/uL
Basophils Absolute Automated: 0.05 10*3/uL (ref 0.00–0.20)
Basophils Automated: 0.8 %
Eosinophils Absolute Automated: 0.09 10*3/uL (ref 0.00–0.70)
Eosinophils Automated: 1.4 %
Hematocrit: 42.2 % (ref 37.0–47.0)
Hgb: 14 g/dL (ref 12.0–16.0)
Immature Granulocytes Absolute: 0.01 10*3/uL
Immature Granulocytes: 0.2 %
Lymphocytes Absolute Automated: 1.99 10*3/uL (ref 0.50–4.40)
Lymphocytes Automated: 32 %
MCH: 28.2 pg (ref 28.0–32.0)
MCHC: 33.2 g/dL (ref 32.0–36.0)
MCV: 85.1 fL (ref 80.0–100.0)
MPV: 9.9 fL (ref 9.4–12.3)
Monocytes Absolute Automated: 0.53 10*3/uL (ref 0.00–1.20)
Monocytes: 8.5 %
Neutrophils Absolute: 3.55 10*3/uL (ref 1.80–8.10)
Neutrophils: 57.1 %
Nucleated RBC: 0 /100 WBC (ref 0.0–1.0)
Platelets: 282 10*3/uL (ref 140–400)
RBC: 4.96 10*6/uL (ref 4.20–5.40)
RDW: 14 % (ref 12–15)
WBC: 6.22 10*3/uL (ref 3.50–10.80)

## 2016-07-24 LAB — VITAMIN D-25 HYDROXY (D2/D3/TOTAL)
25-Hydroxy D2: 5 ng/mL
Vitamin D, 25-OH, D3: 41 ng/mL
Vitamin D, 25-OH, Total: 46 ng/mL (ref 30–100)

## 2016-07-24 LAB — RAPID DRUG SCREEN, URINE
Barbiturate Screen, UR: NEGATIVE
Benzodiazepine Screen, UR: NEGATIVE
Cannabinoid Screen, UR: NEGATIVE
Cocaine, UR: NEGATIVE
Opiate Screen, UR: NEGATIVE
PCP Screen, UR: NEGATIVE
Urine Amphetamine Screen: NEGATIVE

## 2016-07-24 LAB — URINALYSIS
Bilirubin, UA: NEGATIVE
Blood, UA: NEGATIVE
Glucose, UA: NEGATIVE
Ketones UA: NEGATIVE
Leukocyte Esterase, UA: NEGATIVE
Nitrite, UA: NEGATIVE
Protein, UR: NEGATIVE
Specific Gravity UA: 1 (ref 1.001–1.035)
Urine pH: 6 (ref 5.0–8.0)
Urobilinogen, UA: <2 mg/dL

## 2016-07-24 LAB — TROPONIN I: Troponin I: <0.01 ng/mL (ref 0.00–0.09)

## 2016-07-24 LAB — GFR: EGFR: >60

## 2016-07-24 LAB — ETHANOL: Alcohol: NOT DETECTED mg/dL

## 2016-07-24 MED ORDER — IOHEXOL 350 MG/ML IV SOLN
100.0000 mL | Freq: Once | INTRAVENOUS | Status: AC | PRN
Start: 2016-07-24 — End: 2016-07-24

## 2016-07-24 MED ORDER — SODIUM CHLORIDE 0.9 % IV BOLUS
1000.0000 mL | Freq: Once | INTRAVENOUS | Status: AC
Start: 2016-07-24 — End: 2016-07-24
  Administered 2016-07-24: 1000 mL via INTRAVENOUS

## 2016-07-24 MED ORDER — IOHEXOL 350 MG/ML IV SOLN
INTRAVENOUS | Status: AC
Start: 2016-07-24 — End: 2016-07-24
  Administered 2016-07-24: 100 mL via INTRAVENOUS
  Filled 2016-07-24: qty 100

## 2016-07-24 MED ORDER — IOHEXOL 240 MG/ML IJ SOLN
INTRAMUSCULAR | Status: AC
Start: 2016-07-24 — End: 2016-07-24
  Administered 2016-07-24: 50 mL via ORAL
  Filled 2016-07-24: qty 50

## 2016-07-24 MED ORDER — MORPHINE SULFATE 4 MG/ML IJ/IV SOLN (WRAP)
4.0000 mg | Freq: Once | Status: DC
Start: 2016-07-24 — End: 2016-07-25

## 2016-07-24 MED ORDER — ONDANSETRON HCL 4 MG/2ML IJ SOLN
4.0000 mg | Freq: Once | INTRAMUSCULAR | Status: AC
Start: 2016-07-24 — End: 2016-07-24
  Administered 2016-07-24: 4 mg via INTRAVENOUS
  Filled 2016-07-24: qty 2

## 2016-07-24 MED ORDER — IOHEXOL 240 MG/ML IJ SOLN
50.0000 mL | Freq: Once | INTRAMUSCULAR | Status: AC | PRN
Start: 2016-07-24 — End: 2016-07-24

## 2016-07-24 MED ORDER — MORPHINE SULFATE 2 MG/ML IJ/IV SOLN (WRAP)
Status: AC
Start: 2016-07-24 — End: 2016-07-24
  Filled 2016-07-24: qty 2

## 2016-07-24 NOTE — ED Triage Notes (Signed)
Alert orientedx4 no sob , skin warm and dry c/o of right upper quadrant pain since this am , with nausea, no vomiting, no diarrhea.

## 2016-07-24 NOTE — Discharge Instructions (Signed)
Abdominal Pain    You have been diagnosed with abdominal (belly) pain. The cause of your pain is not yet known.    Many things can cause abdominal pain. Examples include viral infections and bowel (intestine) spasms. You might need another examination or more tests to find out why you have pain.    At this time, your pain does not seem to be caused by anything dangerous. You do not need surgery. You do not need to stay in the hospital.     Though we don't believe your condition is dangerous right now, it is important to be careful. Sometimes a problem that seems mild can become serious later. This is why it is very important that you return here or go to the nearest Emergency Department unless you are 100% improved.    YOU SHOULD SEEK MEDICAL ATTENTION IMMEDIATELY, EITHER HERE OR AT THE NEAREST EMERGENCY DEPARTMENT, IF ANY OF THE FOLLOWING OCCURS:   Your pain does not go away or gets worse.   You cannot keep fluids down or your vomit is dark green.    You vomit blood or see blood in your stool. Blood might be bright red or dark red. It can also be black and look like tar.   You have a fever (temperature higher than 100.4F / 38C) or shaking chills.   Your skin or eyes look yellow or your urine looks brown.   You have severe diarrhea.

## 2016-07-24 NOTE — ED Provider Notes (Addendum)
Physician/Midlevel provider first contact with patient: 07/24/16 2004         History     Chief Complaint   Patient presents with   . Abdominal Pain     General pains and aches lal day off and on and no diarrhea no melena/brbpr, no dysuria      The history is provided by the patient.   Abdominal Pain   Pain location:  Generalized  Pain quality: aching    Pain radiates to:  Does not radiate  Pain severity:  Mild  Onset quality:  Sudden  Timing:  Intermittent  Progression:  Waxing and waning  Relieved by:  Nothing  Ineffective treatments:  Antacids  Associated symptoms: nausea    Associated symptoms: no chest pain, no diarrhea, no fatigue, no hematochezia, no hematuria and no sore throat             Past Medical History:   Diagnosis Date   . Chest pain 2016     Chest pain after eating x6  months--being followed by GI for GERD   . Constipation    . Difficulty walking     amb with cane secondary to arthritis bil knees   . Genital herpes     no current outbreak (10/03/15)   . GERD (gastroesophageal reflux disease)    . Hiatal hernia    . Hyperlipidemia    . Hypertensive disorder     Well controlled on med. Denies any SOB in last 6 months (10/03/15)   . Morbid obesity with BMI of 40.0-44.9, adult     BMI 40.9   . Nausea without vomiting    . OSA on CPAP 2015    CPAP nightly x 1 yr   . Osteoarthritis of knees, bilateral    . TIA (transient ischemic attack) 2014    TIA 2014-no residual. Patient evaulated for possible TIA 07/12/2015  @ [facility] (dischage summary in epic)-per summary CT of head was normal and patient was d/c'd       Past Surgical History:   Procedure Laterality Date   . APPENDECTOMY  >25 yrs   . EGD  01/2015   . fallopian tube surgery  >25 yrs   . INSERTION, PAIN PUMP (MEDICAL) N/A 10/22/2015    Procedure: INSERTION, PAIN PUMP (MEDICAL);  Surgeon: Nicola Police, DO;  Location: [place] [facility];  Service: General;  Laterality: N/A;   . LAPAROSCOPIC, CHOLECYSTECTOMY, CHOLANGIOGRAM  08/13/2014   .  LAPAROSCOPIC, GASTRIC BYPASS N/A 10/22/2015    Procedure: LAPAROSCOPIC, GASTRIC BYPASS;  Surgeon: Josefa Half R, DO;  Location: [place] [facility];  Service: General;  Laterality: N/A;   . LAPAROSCOPIC, HERNIORRHAPHY, HIATAL N/A 10/22/2015    Procedure: LAPAROSCOPIC, HERNIORRHAPHY, HIATAL;  Surgeon: Jeanell Sparrow, Hamid R, DO;  Location:  MAIN OR;  Service: General;  Laterality: N/A;   . OVARY SURGERY Right >25 yrs   . SPINE SURGERY  11/2013    cervical HNP x 2, Dr. Bufford Buttner       Family History   Problem Relation Age of Onset   . Cancer Mother 50     breast cancer       Social  Social History   Substance Use Topics   . Smoking status: Never Smoker   . Smokeless tobacco: Never Used   . Alcohol use No       .     Allergies   Allergen Reactions   . Acyclovir Nausea And Vomiting   .  Amoxicillin-Pot Clavulanate      Other reaction(s): gi distress   . Aspirin Nausea And Vomiting     Other reaction(s): gi distress  Upsets stomach  Able to tolerate enteric coated aspirin   . Atorvastatin      Palpitation   . Lovastatin Nausea And Vomiting   . Metronidazole Nausea And Vomiting   . Moxifloxacin Nausea And Vomiting     GI symptoms   . Other Nausea And Vomiting   . Rosuvastatin      Palpitation, chest pain, abd pain    . Statins    . Sulfa Antibiotics Nausea And Vomiting     Other reaction(s): gi distress       Home Medications             albuterol (PROVENTIL HFA;VENTOLIN HFA) 108 (90 BASE) MCG/ACT inhaler     Inhale into the lungs every 4 (four) hours as needed.        atenolol (TENORMIN) 50 MG tablet     TAKE 1 and 1/2 TABLET BY MOUTH DAILY.     desonide (DESOWEN) 0.05 % cream     Apply topically 2 (two) times daily as needed.     docusate sodium (COLACE) 100 MG capsule     Take 100 mg by mouth daily.         fluticasone (FLONASE) 50 MCG/ACT nasal spray     2 sprays by Nasal route daily.     Patient taking differently:  2 sprays by Nasal route as needed.        lidocaine (XYLOCAINE) 2 % jelly     Apply topically as  needed.     LORazepam (ATIVAN) 0.5 MG tablet     Take 1 tablet (0.5 mg total) by mouth every 8 (eight) hours as needed for Anxiety.     pantoprazole (PROTONIX) 40 MG tablet     Take 40 mg by mouth daily.     polyethylene glycol (MIRALAX) packet     Take 17 g by mouth daily as needed.     Patient taking differently:  17 g every other day.         valACYclovir HCL (VALTREX) 500 MG tablet     Take 1 tablet (500 mg total) by mouth daily.     Patient taking differently:  Take 500 mg by mouth as needed.              Review of Systems   Constitutional: Negative.  Negative for activity change, appetite change and fatigue.   HENT: Negative.  Negative for drooling, ear discharge, facial swelling, hearing loss, postnasal drip, rhinorrhea, sinus pain, sore throat, tinnitus and voice change.    Eyes: Negative.  Negative for photophobia, pain, discharge, redness, itching and visual disturbance.   Respiratory: Negative.  Negative for apnea, chest tightness, wheezing and stridor.    Cardiovascular: Negative.  Negative for chest pain, palpitations and leg swelling.   Gastrointestinal: Positive for abdominal pain and nausea. Negative for abdominal distention, diarrhea and hematochezia.   Endocrine: Negative for cold intolerance, polydipsia and polyphagia.   Genitourinary: Negative.  Negative for difficulty urinating, frequency, hematuria and urgency.   Musculoskeletal: Negative for arthralgias, back pain and gait problem.   Skin: Negative for color change.   Neurological: Negative for numbness and headaches.   Psychiatric/Behavioral: Negative for dysphoric mood and hallucinations.   All other systems reviewed and are negative.      Physical Exam    BP: 154/86, Heart  Rate: 72, Resp Rate: 18, SpO2: 100 %, Weight: 64 kg    Physical Exam   Constitutional: She is oriented to person, place, and time. She appears well-developed and well-nourished. No distress.   HENT:   Head: Normocephalic and atraumatic.   Right Ear: External ear normal.    Left Ear: External ear normal.   Nose: Nose normal.   Mouth/Throat: Oropharynx is clear and moist.   Eyes: Conjunctivae and EOM are normal. Pupils are equal, round, and reactive to light.   Neck: Normal range of motion. Neck supple.   Cardiovascular: Normal rate, regular rhythm, normal heart sounds and intact distal pulses.  Exam reveals no gallop and no friction rub.    No murmur heard.  Pulmonary/Chest: Effort normal and breath sounds normal. No respiratory distress. She has no wheezes. She has no rales. She exhibits no tenderness.   Abdominal: Soft. Bowel sounds are normal. She exhibits no distension. There is no tenderness. There is no rebound and no guarding.   Musculoskeletal: Normal range of motion.   Neurological: She is alert and oriented to person, place, and time. She has normal reflexes. No cranial nerve deficit.   Skin: Skin is warm and dry. She is not diaphoretic.   Psychiatric: She has a normal mood and affect.   Nursing note and vitals reviewed.        MDM and ED Course     ED Medication Orders     Start Ordered     Status Ordering Provider    07/24/16 2224 07/24/16 2224  iohexol (OMNIPAQUE) 240 MG/ML solution 50 mL  IMG once as needed     Route: Oral  Ordered Dose: 50 mL     Last MAR action:  Imaging Agent Given Joseph Berkshire    07/24/16 2224 07/24/16 2224  iohexol (OMNIPAQUE) 350 MG/ML injection 100 mL  IMG once as needed     Route: Intravenous  Ordered Dose: 100 mL     Last MAR action:  Imaging Agent Given Joseph Berkshire    07/24/16 2010 07/24/16 2010  morphine 2 mg/mL injection     Comments:  Created by cabinet override    Last MAR action:  Given by Other     07/24/16 2007 07/24/16 2006  sodium chloride 0.9 % bolus 1,000 mL  Once     Route: Intravenous  Ordered Dose: 1,000 mL     Last MAR action:  Stopped Joseph Berkshire    07/24/16 2007 07/24/16 2006  morphine injection 4 mg  Once     Route: Intravenous  Ordered Dose: 4 mg     Acknowledged Rube Sanchez ALAN     07/24/16 2007 07/24/16 2006  ondansetron (ZOFRAN) injection 4 mg  Once     Route: Intravenous  Ordered Dose: 4 mg     Last MAR action:  Given Kairyn Olmeda ALAN             MDM  Number of Diagnoses or Management Options  Lower abdominal pain:         ED Course              Procedures    Clinical Impression & Disposition     Clinical Impression  Final diagnoses:   Lower abdominal pain        ED Disposition     ED Disposition Condition Date/Time Comment    Discharge  Fri Jul 24, 2016 10:56 PM [name] discharge to home/self care.  Condition at disposition: Stable           New Prescriptions    No medications on file           Labs Reviewed   COMPREHENSIVE METABOLIC PANEL - Abnormal; Notable for the following:        Result Value    Glucose 104 (*)     Alkaline Phosphatase 117 (*)     All other components within normal limits   CBC AND DIFFERENTIAL   ETHANOL   TROPONIN I   URINALYSIS   GFR   RAPID DRUG SCREEN, URINE   TSH     FINDINGS: No significant abnormality is seen in the lung bases.    The liver and spleen appear within normal limits. No focal lesions are  seen. Again, the patient is status post cholecystectomy. Mild  intrahepatic biliary dilatation is again noted. Benign-appearing cyst is  again seen superiorly in the right lobe the liver.. No pneumobilia is  seen.  No significant abnormality is seen in the adrenal glands, kidneys,  aorta, and retroperitoneum. No abdominal or retroperitoneal  lymphadenopathy is detected.      No free intraperitoneal air or fluid is detected. Again, there are  findings consistent with prior gastric bypass surgery. Small bowel  demonstrates some mild dilatation distally without a focal obstruction.  Cecum is in normal position in the right lower quadrant. There is no  free air or obstruction seen. No hernia is seen.    CT Scan of the Pelvis:    FINDINGS: Uterus unremarkable. Rectum is distended with stool and air.  Sigmoid colon is not distended. No hernia or  lymphadenopathy seen in the  groin. No ascites or free air is noted in the pelvis. The bladder is not  distended.    No acute abnormality seen in the spine or bony pelvis.    IMPRESSION:     1. Postop with no free air or acute obstruction seen. Nonspecific  changes are again noted in the small bowel as previously reported.  2. Status post cholecystectomy with no change in the appearance of the  biliary tree compared to prior studies.    Charlene Brooke, MD   07/24/2016 10:51 PM           Ralph Dowdy, Claudius Sis, MD  07/24/16 2005       Joseph Berkshire, MD  07/24/16 2013       Joseph Berkshire, MD  07/24/16 2257

## 2016-07-25 ENCOUNTER — Other Ambulatory Visit (INDEPENDENT_AMBULATORY_CARE_PROVIDER_SITE_OTHER): Payer: Self-pay | Admitting: Internal Medicine

## 2016-07-25 DIAGNOSIS — A6000 Herpesviral infection of urogenital system, unspecified: Secondary | ICD-10-CM

## 2016-07-25 LAB — TSH: TSH: 1.89 u[IU]/mL (ref 0.35–4.94)

## 2016-07-27 ENCOUNTER — Ambulatory Visit (INDEPENDENT_AMBULATORY_CARE_PROVIDER_SITE_OTHER): Payer: Commercial Managed Care - HMO

## 2016-07-27 ENCOUNTER — Ambulatory Visit (INDEPENDENT_AMBULATORY_CARE_PROVIDER_SITE_OTHER): Payer: Commercial Managed Care - HMO | Admitting: Family

## 2016-07-28 MED ORDER — VALACYCLOVIR HCL 500 MG PO TABS
500.0000 mg | ORAL_TABLET | Freq: Every day | ORAL | 1 refills | Status: DC
Start: 2016-07-28 — End: 2016-10-19

## 2016-07-30 ENCOUNTER — Ambulatory Visit (INDEPENDENT_AMBULATORY_CARE_PROVIDER_SITE_OTHER): Payer: Commercial Managed Care - HMO

## 2016-07-30 ENCOUNTER — Ambulatory Visit (INDEPENDENT_AMBULATORY_CARE_PROVIDER_SITE_OTHER): Payer: Commercial Managed Care - HMO | Admitting: Surgery

## 2016-08-06 ENCOUNTER — Encounter (INDEPENDENT_AMBULATORY_CARE_PROVIDER_SITE_OTHER): Payer: Self-pay | Admitting: Surgery

## 2016-08-09 ENCOUNTER — Emergency Department: Payer: Commercial Managed Care - HMO

## 2016-08-09 ENCOUNTER — Emergency Department
Admission: EM | Admit: 2016-08-09 | Discharge: 2016-08-09 | Disposition: A | Discharge status: Home / Self Care | Payer: Commercial Managed Care - HMO | Attending: Emergency Medicine | Admitting: Emergency Medicine

## 2016-08-09 DIAGNOSIS — R1011 Right upper quadrant pain: Secondary | ICD-10-CM | POA: Insufficient documentation

## 2016-08-09 DIAGNOSIS — I10 Essential (primary) hypertension: Secondary | ICD-10-CM | POA: Insufficient documentation

## 2016-08-09 DIAGNOSIS — Z9049 Acquired absence of other specified parts of digestive tract: Secondary | ICD-10-CM | POA: Insufficient documentation

## 2016-08-09 DIAGNOSIS — Z9884 Bariatric surgery status: Secondary | ICD-10-CM | POA: Insufficient documentation

## 2016-08-09 DIAGNOSIS — Z8673 Personal history of transient ischemic attack (TIA), and cerebral infarction without residual deficits: Secondary | ICD-10-CM | POA: Insufficient documentation

## 2016-08-09 DIAGNOSIS — Z79899 Other long term (current) drug therapy: Secondary | ICD-10-CM | POA: Insufficient documentation

## 2016-08-09 DIAGNOSIS — K219 Gastro-esophageal reflux disease without esophagitis: Secondary | ICD-10-CM | POA: Insufficient documentation

## 2016-08-09 DIAGNOSIS — G4733 Obstructive sleep apnea (adult) (pediatric): Secondary | ICD-10-CM | POA: Insufficient documentation

## 2016-08-09 DIAGNOSIS — R42 Dizziness and giddiness: Secondary | ICD-10-CM | POA: Insufficient documentation

## 2016-08-09 LAB — GFR: EGFR: >60

## 2016-08-09 LAB — COMPREHENSIVE METABOLIC PANEL
ALT: 13 U/L (ref 0–55)
AST (SGOT): 18 U/L (ref 5–34)
Albumin/Globulin Ratio: 1.2 (ref 0.9–2.2)
Albumin: 3.6 g/dL (ref 3.5–5.0)
Alkaline Phosphatase: 116 U/L — ABNORMAL HIGH (ref 37–106)
Anion Gap: 9 (ref 5.0–15.0)
BUN: 13 mg/dL (ref 7.0–19.0)
Bilirubin, Total: 0.4 mg/dL (ref 0.2–1.2)
CO2: 26 mEq/L (ref 22–29)
Calcium: 9.9 mg/dL (ref 8.5–10.5)
Chloride: 109 mEq/L (ref 100–111)
Creatinine: 0.7 mg/dL (ref 0.6–1.0)
Globulin: 3.1 g/dL (ref 2.0–3.6)
Glucose: 91 mg/dL (ref 70–100)
Potassium: 3.6 mEq/L (ref 3.5–5.1)
Protein, Total: 6.7 g/dL (ref 6.0–8.3)
Sodium: 144 mEq/L (ref 136–145)

## 2016-08-09 LAB — CBC AND DIFFERENTIAL
Absolute NRBC: 0 10*3/uL
Basophils Absolute Automated: 0.02 10*3/uL (ref 0.00–0.20)
Basophils Automated: 0.4 %
Eosinophils Absolute Automated: 0.04 10*3/uL (ref 0.00–0.70)
Eosinophils Automated: 0.9 %
Hematocrit: 41.1 % (ref 37.0–47.0)
Hgb: 13.3 g/dL (ref 12.0–16.0)
Immature Granulocytes Absolute: 0.01 10*3/uL
Immature Granulocytes: 0.2 %
Lymphocytes Absolute Automated: 1.12 10*3/uL (ref 0.50–4.40)
Lymphocytes Automated: 24 %
MCH: 28.4 pg (ref 28.0–32.0)
MCHC: 32.4 g/dL (ref 32.0–36.0)
MCV: 87.6 fL (ref 80.0–100.0)
MPV: 9.6 fL (ref 9.4–12.3)
Monocytes Absolute Automated: 0.4 10*3/uL (ref 0.00–1.20)
Monocytes: 8.6 %
Neutrophils Absolute: 3.08 10*3/uL (ref 1.80–8.10)
Neutrophils: 65.9 %
Nucleated RBC: 0 /100 WBC (ref 0.0–1.0)
Platelets: 258 10*3/uL (ref 140–400)
RBC: 4.69 10*6/uL (ref 4.20–5.40)
RDW: 15 % (ref 12–15)
WBC: 4.67 10*3/uL (ref 3.50–10.80)

## 2016-08-09 LAB — URINALYSIS, REFLEX TO MICROSCOPIC EXAM IF INDICATED
Bilirubin, UA: NEGATIVE
Blood, UA: NEGATIVE
Glucose, UA: NEGATIVE
Ketones UA: NEGATIVE
Leukocyte Esterase, UA: NEGATIVE
Nitrite, UA: NEGATIVE
Protein, UR: NEGATIVE
Specific Gravity UA: 1.004 (ref 1.001–1.035)
Urine pH: 8 (ref 5.0–8.0)
Urobilinogen, UA: NEGATIVE mg/dL

## 2016-08-09 LAB — HEMOLYSIS INDEX: Hemolysis Index: 5 (ref 0–18)

## 2016-08-09 LAB — LIPASE: Lipase: 11 U/L (ref 8–78)

## 2016-08-09 MED ORDER — ONDANSETRON HCL 4 MG/2ML IJ SOLN
4.0000 mg | Freq: Once | INTRAMUSCULAR | Status: AC
Start: 2016-08-09 — End: 2016-08-09
  Administered 2016-08-09: 4 mg via INTRAVENOUS
  Filled 2016-08-09: qty 2

## 2016-08-09 MED ORDER — MORPHINE SULFATE 4 MG/ML IJ/IV SOLN (WRAP)
4.0000 mg | Freq: Once | Status: AC
Start: 2016-08-09 — End: 2016-08-09
  Administered 2016-08-09: 4 mg via INTRAVENOUS
  Filled 2016-08-09: qty 1

## 2016-08-09 MED ORDER — SODIUM CHLORIDE 0.9 % IV BOLUS
1000.0000 mL | Freq: Once | INTRAVENOUS | Status: AC
Start: 2016-08-09 — End: 2016-08-09
  Administered 2016-08-09: 1000 mL via INTRAVENOUS

## 2016-08-09 NOTE — ED Triage Notes (Signed)
Patient presents to the ED with 2-3 days history of lower abdominal pain with nausea. Denies any vomiting.

## 2016-08-09 NOTE — ED Notes (Signed)
Patient in ultrasound prior to lab draw/medication

## 2016-08-09 NOTE — Discharge Instructions (Signed)
Abdominal Pain    You have been diagnosed with abdominal (belly) pain. The cause of your pain is not yet known.    Many things can cause abdominal pain. Examples include viral infections and bowel (intestine) spasms. You might need another examination or more tests to find out why you have pain.    At this time, your pain does not seem to be caused by anything dangerous. You do not need surgery. You do not need to stay in the hospital.     Though we don't believe your condition is dangerous right now, it is important to be careful. Sometimes a problem that seems mild can become serious later. This is why it is very important that you return here or go to the nearest Emergency Department unless you are 100% improved.    Return here or go to the nearest Emergency Department, or follow up with your physician in:   12 hours.    Drink only clear liquids such as water, clear broth, sports drinks, or clear caffeine-free soft drinks, like 7-Up or Sprite, for the next:   12 hours.    YOU SHOULD SEEK MEDICAL ATTENTION IMMEDIATELY, EITHER HERE OR AT THE NEAREST EMERGENCY DEPARTMENT, IF ANY OF THE FOLLOWING OCCURS:   Your pain does not go away or gets worse.   You cannot keep fluids down or your vomit is dark green.    You vomit blood or see blood in your stool. Blood might be bright red or dark red. It can also be black and look like tar.   You have a fever (temperature higher than 100.4F / 38C) or shaking chills.   Your skin or eyes look yellow or your urine looks brown.   You have severe diarrhea.

## 2016-08-09 NOTE — ED Provider Notes (Signed)
EMERGENCY DEPARTMENT HISTORY AND PHYSICAL EXAM     Physician/Midlevel provider first contact with patient: 08/09/16 1610         Date: 08/09/2016  Patient Name: Amber [name]    History of Presenting Illness     Chief Complaint   Patient presents with   . Abdominal Pain       History Provided By: Patient and Patient's Friend    Chief Complaint: Abdominal pain  Onset: Several weeks ago  Timing: Constant  Location: Lower abdomen radiating to the back  Severity: Moderate  Exacerbating factors: None  Alleviating factors: Tylenol with some relief  Associated Symptoms: Dizziness  Pertinent Negatives: No appetite change or fever    Additional History: Amber [name] is a 65 y.o. female presenting to the ED with constant lower abdominal pain that began several weeks ago. Pt's pain travels to her back. She has taken Tylenol with some relief. Pt was seen for similar pain and was told to Amber her bariatric surgeon. She had gastric bypass surgery in 10/2016. Pt had problems with constipation since her surgery. She feels like she's had a prolapsed rectum for one month. Pt's GI doctor did not examine her rectum during her appointment last week. She has an appointment with her GI doctor next week. Pt also reports dizziness, explaining that she was staggering when she walked this morning. She denies appetite change or fever.    PCP: Oneita Jolly, MD  SPECIALISTS:    No current facility-administered medications for this encounter.      Current Outpatient Prescriptions   Medication Sig Dispense Refill   . albuterol (PROVENTIL HFA;VENTOLIN HFA) 108 (90 BASE) MCG/ACT inhaler Inhale into the lungs every 4 (four) hours as needed.        Marland Kitchen atenolol (TENORMIN) 50 MG tablet TAKE 1 and 1/2 TABLET BY MOUTH DAILY. 140 tablet 1   . desonide (DESOWEN) 0.05 % cream Apply topically 2 (two) times daily as needed. 60 g 2   . docusate sodium (COLACE) 100 MG capsule Take 100 mg by mouth daily.         . fluticasone (FLONASE) 50 MCG/ACT nasal spray  2 sprays by Nasal route daily. (Patient taking differently: 2 sprays by Nasal route as needed.   ) 16 g 2   . lidocaine (XYLOCAINE) 2 % jelly Apply topically as needed. 30 mL 1   . LORazepam (ATIVAN) 0.5 MG tablet Take 1 tablet (0.5 mg total) by mouth every 8 (eight) hours as needed for Anxiety. 30 tablet 1   . pantoprazole (PROTONIX) 40 MG tablet Take 40 mg by mouth daily.     . polyethylene glycol (MIRALAX) packet Take 17 g by mouth daily as needed. (Patient taking differently: 17 g every other day.    ) 5 packet 0   . valACYclovir HCL (VALTREX) 500 MG tablet Take 1 tablet (500 mg total) by mouth daily. 90 tablet 1       Past History     Past Medical History:  Past Medical History:   Diagnosis Date   . Chest pain 2016     Chest pain after eating x6  months--being followed by GI for GERD   . Constipation    . Difficulty walking     amb with cane secondary to arthritis bil knees   . Genital herpes     no current outbreak (10/03/15)   . GERD (gastroesophageal reflux disease)    . Hiatal hernia    .  Hyperlipidemia    . Hypertensive disorder     Well controlled on med. Denies any SOB in last 6 months (10/03/15)   . Morbid obesity with BMI of 40.0-44.9, adult     BMI 40.9   . Nausea without vomiting    . OSA on CPAP 2015    CPAP nightly x 1 yr   . Osteoarthritis of knees, bilateral    . TIA (transient ischemic attack) 2014    TIA 2014-no residual. Patient evaulated for possible TIA 07/12/2015  @ [facility] (dischage summary in epic)-per summary CT of head was normal and patient was d/c'd       Past Surgical History:  Past Surgical History:   Procedure Laterality Date   . APPENDECTOMY  >25 yrs   . EGD  01/2015   . fallopian tube surgery  >25 yrs   . INSERTION, PAIN PUMP (MEDICAL) N/A 10/22/2015    Procedure: INSERTION, PAIN PUMP (MEDICAL);  Surgeon: Nicola Police, DO;  Location: [facility];  Service: General;  Laterality: N/A;   . LAPAROSCOPIC, CHOLECYSTECTOMY, CHOLANGIOGRAM  08/13/2014   . LAPAROSCOPIC, GASTRIC  BYPASS N/A 10/22/2015    Procedure: LAPAROSCOPIC, GASTRIC BYPASS;  Surgeon: Josefa Half R, DO;  Location: [facility];  Service: General;  Laterality: N/A;   . LAPAROSCOPIC, HERNIORRHAPHY, HIATAL N/A 10/22/2015    Procedure: LAPAROSCOPIC, HERNIORRHAPHY, HIATAL;  Surgeon: Jeanell Sparrow, Hamid R, DO;  Location: [facility];  Service: General;  Laterality: N/A;   . OVARY SURGERY Right >25 yrs   . SPINE SURGERY  11/2013    cervical HNP x 2, Dr. Bufford Buttner       Family History:  Family History   Problem Relation Age of Onset   . Cancer Mother 46     breast cancer       Social History:  Social History   Substance Use Topics   . Smoking status: Never Smoker   . Smokeless tobacco: Never Used   . Alcohol use No       Allergies:  Allergies   Allergen Reactions   . Acyclovir Nausea And Vomiting   . Amoxicillin-Pot Clavulanate      Other reaction(s): gi distress   . Aspirin Nausea And Vomiting     Other reaction(s): gi distress  Upsets stomach  Able to tolerate enteric coated aspirin   . Atorvastatin      Palpitation   . Lovastatin Nausea And Vomiting   . Metronidazole Nausea And Vomiting   . Moxifloxacin Nausea And Vomiting     GI symptoms   . Other Nausea And Vomiting   . Rosuvastatin      Palpitation, chest pain, abd pain    . Statins    . Sulfa Antibiotics Nausea And Vomiting     Other reaction(s): gi distress       Review of Systems     Review of Systems   Constitutional: Negative for appetite change and fever.   HENT: Negative for nosebleeds.    Eyes: Negative for discharge.   Respiratory: Negative for shortness of breath.    Cardiovascular: Negative for chest pain.   Gastrointestinal: Positive for abdominal pain.   Genitourinary: Negative for dysuria.   Skin: Negative for rash.   Neurological: Positive for dizziness.   Psychiatric/Behavioral: Negative for suicidal ideas.       Physical Exam   BP 156/76   Pulse 70   Temp 98 F (36.7 C) (Oral)   Resp 17  Ht 5\' 1"  (1.549 m)   Wt 63.5 kg   SpO2 100%   BMI  26.45 kg/m        Physical Exam   Constitutional: She is oriented to person, place, and time. She appears well-developed and well-nourished.   HENT:   Head: Normocephalic and atraumatic.   Eyes: Conjunctivae are normal. No scleral icterus.   Cardiovascular: Normal rate, regular rhythm and normal heart sounds.  Exam reveals no gallop and no friction rub.    No murmur heard.  Pulmonary/Chest: Effort normal and breath sounds normal. No respiratory distress. She has no wheezes. She has no rales. She exhibits no tenderness.   Abdominal: Soft. She exhibits no distension and no mass. Pulsatile midline mass: nonfocal. There is tenderness. There is no rebound and no guarding.   Genitourinary:   Genitourinary Comments: Rectal: There is no prolapsed rectum. No hemorrhoids.    Musculoskeletal: Normal range of motion. She exhibits no edema.   Neurological: She is alert and oriented to person, place, and time.   Skin: Skin is warm and dry. No rash noted.   Psychiatric: She has a normal mood and affect. Her behavior is normal. Judgment and thought content normal.   Nursing note and vitals reviewed.        Diagnostic Study Results     Labs -     Results     Procedure Component Value Units Date/Time    Comprehensive metabolic panel [[id number]]  (Abnormal) Collected:  08/09/16 1115    Specimen:  Blood Updated:  08/09/16 1144     Glucose 91 mg/dL      BUN 59.5 mg/dL      Creatinine 0.7 mg/dL      Sodium 638 mEq/L      Potassium 3.6 mEq/L      Chloride 109 mEq/L      CO2 26 mEq/L      Calcium 9.9 mg/dL      Protein, Total 6.7 g/dL      Albumin 3.6 g/dL      AST (SGOT) 18 U/L      ALT 13 U/L      Alkaline Phosphatase 116 (H) U/L      Bilirubin, Total 0.4 mg/dL      Globulin 3.1 g/dL      Albumin/Globulin Ratio 1.2     Anion Gap 9.0    Lipase [[id number]] Collected:  08/09/16 1115    Specimen:  Blood Updated:  08/09/16 1144     Lipase 11 U/L     Hemolysis index [[id number]] Collected:  08/09/16 1115     Updated:  08/09/16 1144      Hemolysis Index 5    GFR [[id number]] Collected:  08/09/16 1115     Updated:  08/09/16 1144     EGFR >60.0    UA, Reflex to Microscopic (pts  3 + yrs) [[id number]] Collected:  08/09/16 1115    Specimen:  Urine Updated:  08/09/16 1132     Urine Type Clean Catch     Color, UA Straw     Clarity, UA Clear     Specific Gravity UA 1.004     Urine pH 8.0     Leukocyte Esterase, UA Negative     Nitrite, UA Negative     Protein, UR Negative     Glucose, UA Negative     Ketones UA Negative     Urobilinogen, UA Negative mg/dL      Bilirubin, UA Negative  Blood, UA Negative    CBC with differential [[id number]] Collected:  08/09/16 1115    Specimen:  Blood from Blood Updated:  08/09/16 1130     WBC 4.67 x10 3/uL      Hgb 13.3 g/dL      Hematocrit 19.1 %      Platelets 258 x10 3/uL      RBC 4.69 x10 6/uL      MCV 87.6 fL      MCH 28.4 pg      MCHC 32.4 g/dL      RDW 15 %      MPV 9.6 fL      Neutrophils 65.9 %      Lymphocytes Automated 24.0 %      Monocytes 8.6 %      Eosinophils Automated 0.9 %      Basophils Automated 0.4 %      Immature Granulocyte 0.2 %      Nucleated RBC 0.0 /100 WBC      Neutrophils Absolute 3.08 x10 3/uL      Abs Lymph Automated 1.12 x10 3/uL      Abs Mono Automated 0.40 x10 3/uL      Abs Eos Automated 0.04 x10 3/uL      Absolute Baso Automated 0.02 x10 3/uL      Absolute Immature Granulocyte 0.01 x10 3/uL      Absolute NRBC 0.00 x10 3/uL           Radiologic Studies -   Radiology Results (24 Hour)     Procedure Component Value Units Date/Time    US Abdomen Complete [[id number]] Collected:  08/09/16 1108    Order Status:  Completed Updated:  08/09/16 1114    Narrative:       CLINICAL INDICATION: Right upper quadrant pain status post  cholecystectomy.    TECHNIQUE:  Grayscale and color assessment of the abdomen was performed  by the ultrasound technologist.  The saved images were reviewed by the  radiologist.    FINDINGS:     Liver: Within normal limits. No intrahepatic biliary dilatation  is  appreciated.    Spleen: Within normal limits.    Gallbladder: Absent.    Common bile duct: Normal in caliber measuring 5 mm.    Pancreas: Limited assessment; however, visualized portions are  unremarkable.    Kidneys: No evidence of hydronephrosis.  No solid renal lesions are  appreciated. Simple cyst within the lower pole of the right kidney  measures 2.1 x 1.9 x 2.0 cm. Size within normal limits.       Misc: The visualized IVC and aorta are unremarkable.         Impression:         No evidence of acute intra-abdominal process.    Gustavus Messing, MD   08/09/2016 11:10 AM        .    Medical Decision Making   I am the first provider for this patient.    I reviewed the vital signs, available nursing notes, past medical history, past surgical history, family history and social history.    Vital Signs-Reviewed the patient's vital signs.     Patient Vitals for the past 12 hrs:   BP Temp Pulse Resp   08/09/16 1221 156/76 98 F (36.7 C) 70 17   08/09/16 0943 181/77 98 F (36.7 C) 72 18       Pulse Oximetry Analysis - Normal 100% on RA     EKG:  Interpreted by the Emergency Physician.   Time Interpreted: 4540   Rate: 69   Rhythm: Normal Sinus Rhythm with normal axis and normal intervals. No ischemic changes.    Interpretation: Normal   Comparison: Compared to 06/24/3026, T wave inversions in V2 and lead III have resolved.      Old Medical Records: Pt has a PMHx of GERD, hiatal hernia and constipation. She has a PSHx of cholecystectomy. She was seen for RUQ abdominal pain at [facility] on 07/24/2016. She had a CT on 07/24/2016 that appeared unremarkable.  Nursing notes.     ED Course:     11:53 AM - Updated pt on results. She has an appointment scheduled with her GI doctor and bariatric surgeon, Dr. Jeanell Sparrow, this week.    12:16 PM - Discussed results with pt and her friend and counseled on diagnosis, f/u plans, medication use, and signs and symptoms when to return to ED.  Pt is stable and ready for discharge.        Provider Notes: s/p choley. Pt has been worked up for this pain before and in fact saw her GI last week for same and was referred back to her bariatric surgeon with whom she has an appt next week. No explanatory finding on CT scan. Pt has findings suggestive of CBD stone on CT and liver labs are unremarkable. Will discharge with surgical follow-up. There is also no e/o rectal prolapse despite pt's concern for same.      Diagnosis     Clinical Impression:   1. Right upper quadrant abdominal pain    2. Dizziness        Treatment Plan:   ED Disposition     ED Disposition Condition Date/Time Comment    Discharge  Sun Aug 09, 2016 12:16 PM [name] discharge to home/self care.    Condition at disposition: Stable            _______________________________      Attestations: This note is prepared by Adela Glimpse, acting as scribe for Kathryne Hitch, MD.    Kathryne Hitch, MD - The scribe's documentation has been prepared under my direction and personally reviewed by me in its entirety.  I confirm that the note above accurately reflects all work, treatment, procedures, and medical decision making performed by me.    _______________________________       Kathryne Hitch, MD  08/12/16 2139

## 2016-08-12 ENCOUNTER — Encounter (INDEPENDENT_AMBULATORY_CARE_PROVIDER_SITE_OTHER): Payer: Self-pay | Admitting: Internal Medicine

## 2016-08-12 LAB — ECG 12-LEAD
Atrial Rate: 69 {beats}/min
P Axis: 56 degrees
P-R Interval: 172 ms
Q-T Interval: 368 ms
QRS Duration: 86 ms
QTC Calculation (Bezet): 394 ms
R Axis: 8 degrees
T Axis: 36 degrees
Ventricular Rate: 69 {beats}/min

## 2016-08-13 ENCOUNTER — Ambulatory Visit (INDEPENDENT_AMBULATORY_CARE_PROVIDER_SITE_OTHER): Payer: Commercial Managed Care - HMO | Admitting: Surgery

## 2016-08-13 ENCOUNTER — Encounter (INDEPENDENT_AMBULATORY_CARE_PROVIDER_SITE_OTHER): Payer: Self-pay | Admitting: Surgery

## 2016-08-13 ENCOUNTER — Ambulatory Visit (INDEPENDENT_AMBULATORY_CARE_PROVIDER_SITE_OTHER): Payer: Commercial Managed Care - HMO

## 2016-08-13 VITALS — BP 151/90 | HR 65 | Temp 97.5°F | Resp 18 | Ht 61.0 in | Wt 146.0 lb

## 2016-08-13 DIAGNOSIS — Z9884 Bariatric surgery status: Secondary | ICD-10-CM

## 2016-08-13 DIAGNOSIS — E559 Vitamin D deficiency, unspecified: Secondary | ICD-10-CM

## 2016-08-13 DIAGNOSIS — Z6827 Body mass index (BMI) 27.0-27.9, adult: Secondary | ICD-10-CM

## 2016-08-13 DIAGNOSIS — E639 Nutritional deficiency, unspecified: Secondary | ICD-10-CM

## 2016-08-13 DIAGNOSIS — Z713 Dietary counseling and surveillance: Secondary | ICD-10-CM

## 2016-08-13 NOTE — Progress Notes (Addendum)
S: Pt presents for 9 month post SG  diet follow up.  Pt states she feels great overall, and is not at her goal weight.   Pt desires to loose about 10 additional pounds.      Pt reports she is currently eating regular foods; has no issues with food intolerances and follows nutrition related meal time recommendations (30/30 rule; eating routine meals).  Pt states she feels she cannot eat hamburger right now, and sometimes gets pain.  Pt reports she is measuring portions  And reports portions are at  1/2-1 cup.  Pt reports she does not use any supplemental fat to cook with either boils or uses george fore[employer] grill.      Pt continues with all recommended vitamin/mineral supplements at this time : Bariatric Advantage 2x/ multi; B-50 complex; Iron pill; resumed Vit D; taking supplemental A and B-12 1x/week.   Pt had questions about OTC vitamin/minerals that she might be able to take.    Pt also drinks protein supplement:  Pt continues with her protein drinks and gets in 54 grams protein daily from a supplemental source (BA HPMR drink).       Fluid Intake reported at: 28 ounces/day or less.      For physical activity,: walking 3 days/week (30-40 minutes) 90-120 minutes/week; pt reports she does not do much resistance, is following up for issues with arm and received referral for  Work up  To work on possible issues with rotator cuffs.    Diet Recall:  Pt states she does not routinely add essential fats, may have avacado 1x/week but does not use essential fats such as olive oil, routinely.                Breakfast: Oatmeal Plain (80 cals/ 3 gms pro): no nuts added              Snack: Protein Shake (27 gms pro/ 160 cals)              Lunch: 3 wingettes Boiled Chicken plus 4 tablespoons lima beans and corn (7-10 grams pro/ ~100 cals); 1/2 Dannon light and fit              Snack: Protein Shake ( 27 gms pro/ 160 cals)              Dinner:Same as Lunch (7-10 grams pro/ ~100 cals/ ) 1/2 Dannon light and fit (80 cals/ 12 grams  pro)              Alcohol: no      O:  Today's Wt:       Previous Visit Wt:    Wt Readings from Last 10 Encounters:   08/13/16 146 lb   08/09/16 140 lb   07/24/16 141 lb   06/25/16 151 lb   06/24/16 142 lb   05/23/16 152 lb   04/24/16 162 lb   04/17/16 162 lb 1.9 oz   03/11/16 162 lb   01/16/16 182 lb 1.9 oz     BMI:  27.59 Total Wt Loss: 36 pounds  Weight Change from previous visit: - 5 pounds  Surgery Date:  Oct 22, 2015  Pertinent Lab Values:  Pt reports she is taking 1,000 mg vit D /day; Vit A con't low.  Allergies:    Allergies   Allergen Reactions   . Acyclovir Nausea And Vomiting   . Amoxicillin-Pot Clavulanate      Other reaction(s): gi distress   . Aspirin  Nausea And Vomiting     Other reaction(s): gi distress  Upsets stomach  Able to tolerate enteric coated aspirin   . Atorvastatin      Palpitation   . Lovastatin Nausea And Vomiting   . Metronidazole Nausea And Vomiting   . Moxifloxacin Nausea And Vomiting     GI symptoms   . Other Nausea And Vomiting   . Rosuvastatin      Palpitation, chest pain, abd pain    . Statins    . Sulfa Antibiotics Nausea And Vomiting     Other reaction(s): gi distress       Estimated Protein Intake: 29 grams protein from food + 54grams protein from supplement = 83 grams /protein/day;~<1000 calories/day  Estimated Needs: 65 grams protein/ 1000 calories from produce (fruit/ healthy carbs such as above and quinoa)/ more than 64 ounces fluid/day.  Diet should include daily essential fats w/ meals to help with absorption of fat-soluable vitamins: ADEK.  Pt plans to increase fiber intake.     Nutrition Diagnosis/Assessment: Pt with the resolving condition of obesity with limited adherence to nutrition related recommendations as evidenced by report (low Vit A& D, not routine essential fats), recall (inadequate fluid intake; essential fatty acid intake) and improvingBMI: 27.59kg/m2.   Nutrition Intervention Included:  1) Reviewed protein needs and measuring tips to help pt  maximize nutrition (include healthy fats)  2) Reviewed supplement needs; provided with OTC list  3) Reviewed Portion size for stage:6-8oz/meal; 5-6protein/day; 2-3 servings produce/day; 2healthy carb (form of quinoa, beans, whole grain); handout provided.    P:  1. Pt to monitor and measure portions, keeping to current portion size( 6 servings/day protein; 2 servings/day produce; 1-2 servings/day healthy carbs)  2. Pt to continue using protein rich foods/supplements for snacks at this time.  3. Pt to continue with all supplements  Will F/U Vit A and D at next visit and reassess.  4 Pt to f/u in 3 months .    Spent a total of 15 minutes in one on one education setting. Plan discussed with MD

## 2016-08-17 NOTE — Progress Notes (Signed)
Assessment:  Status Post Laparoscopic RNY-Gastric Bypass      Plan:  1.  Patient is to continue with portion control  2.  Patient is to increase exercise.  3.  Patient is to continue with supplements.  4.  Patient is to follow up with PCP as needed.  5.  Patient is to follow up in 3 month(s) with labs          Subjective:  Amber [name] returns for follow up 10 month(s) post laparoscopic RNY-Gastric Bypass. She is doing well with 72 lbs. total weight loss. She is tolerating a regular diet and denies any nausea, vomiting, reflux or abdominal pain.  She is compliant with portion control and vitamin supplementation. Her energy level is good. She is exercising regularly.  Lab work was reviewed.    Objective:  The following portions of the patient's history were reviewed and updated as appropriate: allergies, current medications, past family history, past medical history, past social history, past surgical history and problem list.    General appearance: alert, appears stated age and cooperative  Head: Normocephalic, without obvious abnormality, atraumatic  Eyes: negative findings: conjunctivae and sclerae normal  Neck: no adenopathy, no carotid bruit, no JVD, supple, symmetrical, trachea midline and thyroid not enlarged, symmetric, no tenderness/mass/nodules  Lungs: clear to auscultation bilaterally  Heart: regular rate and rhythm, S1, S2 normal, no murmur, click, rub or gallop  Abdomen: soft, non-tender; bowel sounds normal; no masses,  no organomegaly  Extremities: extremities normal, atraumatic, no cyanosis or edema and Homans sign is negative, no sign of DVT      Admission on 08/09/2016, Discharged on 08/09/2016   Component Date Value Ref Range Status   . Glucose 08/09/2016 91  70 - 100 mg/dL Final    Comment: ADA guidelines for diabetes mellitus:  Fasting:  Equal to or greater than 126 mg/dL  Random:   Equal to or greater than 200 mg/dL     . BUN 08/09/2016 13.0  7.0 - 19.0 mg/dL Final   .  Creatinine 96/02/5408 0.7  0.6 - 1.0 mg/dL Final   . Sodium 81/19/1478 144  136 - 145 mEq/L Final   . Potassium 08/09/2016 3.6  3.5 - 5.1 mEq/L Final   . Chloride 08/09/2016 109  100 - 111 mEq/L Final   . CO2 08/09/2016 26  22 - 29 mEq/L Final   . Calcium 08/09/2016 9.9  8.5 - 10.5 mg/dL Final   . Protein, Total 08/09/2016 6.7  6.0 - 8.3 g/dL Final   . Albumin 29/56/2130 3.6  3.5 - 5.0 g/dL Final   . AST (SGOT) 86/57/8469 18  5 - 34 U/L Final   . ALT 08/09/2016 13  0 - 55 U/L Final   . Alkaline Phosphatase 08/09/2016 116* 37 - 106 U/L Final   . Bilirubin, Total 08/09/2016 0.4  0.2 - 1.2 mg/dL Final   . Globulin 62/95/2841 3.1  2.0 - 3.6 g/dL Final   . Albumin/Globulin Ratio 08/09/2016 1.2  0.9 - 2.2 Final   . Anion Gap 08/09/2016 9.0  5.0 - 15.0 Final   . Lipase 08/09/2016 11  8 - 78 U/L Final   . WBC 08/09/2016 4.67  3.50 - 10.80 x10 3/uL Final   . Hgb 08/09/2016 13.3  12.0 - 16.0 g/dL Final   . Hematocrit 32/44/0102 41.1  37.0 - 47.0 % Final   . Platelets 08/09/2016 258  140 - 400 x10 3/uL Final   . RBC 08/09/2016 4.69  4.20 - 5.40 x10 6/uL Final   . MCV 08/09/2016 87.6  80.0 - 100.0 fL Final   . MCH 08/09/2016 28.4  28.0 - 32.0 pg Final   . MCHC 08/09/2016 32.4  32.0 - 36.0 g/dL Final   . RDW 96/02/5408 15  12 - 15 % Final   . MPV 08/09/2016 9.6  9.4 - 12.3 fL Final   . Neutrophils 08/09/2016 65.9  None % Final   . Lymphocytes Automated 08/09/2016 24.0  None % Final   . Monocytes 08/09/2016 8.6  None % Final   . Eosinophils Automated 08/09/2016 0.9  None % Final   . Basophils Automated 08/09/2016 0.4  None % Final   . Immature Granulocyte 08/09/2016 0.2  None % Final   . Nucleated RBC 08/09/2016 0.0  0.0 - 1.0 /100 WBC Final   . Neutrophils Absolute 08/09/2016 3.08  1.80 - 8.10 x10 3/uL Final   . Abs Lymph Automated 08/09/2016 1.12  0.50 - 4.40 x10 3/uL Final   . Abs Mono Automated 08/09/2016 0.40  0.00 - 1.20 x10 3/uL Final   . Abs Eos Automated 08/09/2016 0.04  0.00 - 0.70 x10 3/uL Final   . Absolute Baso  Automated 08/09/2016 0.02  0.00 - 0.20 x10 3/uL Final   . Absolute Immature Granulocyte 08/09/2016 0.01  0 x10 3/uL Final   . Absolute NRBC 08/09/2016 0.00  0 x10 3/uL Final   . Urine Type 08/09/2016 Clean Catch   Final   . Color, UA 08/09/2016 Straw  Clear - Yellow Final   . Clarity, UA 08/09/2016 Clear  Clear - Hazy Final   . Specific Gravity UA 08/09/2016 1.004  1.001 - 1.035 Final   . Urine pH 08/09/2016 8.0  5.0 - 8.0 Final   . Leukocyte Esterase, UA 08/09/2016 Negative  Negative Final   . Nitrite, UA 08/09/2016 Negative  Negative Final   . Protein, UR 08/09/2016 Negative  Negative Final   . Glucose, UA 08/09/2016 Negative  Negative Final   . Ketones UA 08/09/2016 Negative  Negative Final   . Urobilinogen, UA 08/09/2016 Negative  0.2 - 2.0 mg/dL Final   . Bilirubin, UA 08/09/2016 Negative  Negative Final   . Blood, UA 08/09/2016 Negative  Negative Final    Comment: NOTE:  URINE CHEMISTRY INDICATORS ARE NEGATIVE.  URINE         MICROSCOPIC NOT INDICATED.     Marland Kitchen Hemolysis Index 08/09/2016 5  0 - 18 Final   . EGFR 08/09/2016 >60.0   Final    Comment: Disease State Reference Ranges:    Chronic Kidney Disease; < 60 ml/min/1.73 sq.m    Kidney Failure; < 15 ml/min/1.73 sq.m    [Calculated using IDMS-Traceable MDRD equation (based on    gender, age and black vs. non-black race) recommended by    [employer] Kidney Disease Education Program. No data    available for non-white, non-black race.]  GFR estimates are unreliable in patients with:    Rapidly changing kidney function or recent dialysis,    extreme age, body size or body composition(obesity,    severe malnutrition). Abnormal muscle mass (limb    amputation, muscle wasting). In these patients,    alternative determinations of GFR should be obtained.     . Ventricular Rate 08/12/2016 69  BPM Final   . Atrial Rate 08/12/2016 69  BPM Final   . P-R Interval 08/12/2016 172  ms Final   . QRS Duration 08/12/2016 86  ms Final   .  Q-T Interval 08/12/2016 368  ms Final   . QTC  Calculation (Bezet) 08/12/2016 394  ms Final   . P Axis 08/12/2016 56  degrees Final   . R Axis 08/12/2016 8  degrees Final   . T Axis 08/12/2016 36  degrees Final   Admission on 07/24/2016, Discharged on 07/24/2016   Component Date Value Ref Range Status   . WBC 07/24/2016 6.22  3.50 - 10.80 x10 3/uL Final   . Hgb 07/24/2016 14.0  12.0 - 16.0 g/dL Final   . Hematocrit 16/08/9603 42.2  37.0 - 47.0 % Final   . Platelets 07/24/2016 282  140 - 400 x10 3/uL Final   . RBC 07/24/2016 4.96  4.20 - 5.40 x10 6/uL Final   . MCV 07/24/2016 85.1  80.0 - 100.0 fL Final   . MCH 07/24/2016 28.2  28.0 - 32.0 pg Final   . MCHC 07/24/2016 33.2  32.0 - 36.0 g/dL Final   . RDW 54/07/8118 14  12 - 15 % Final   . MPV 07/24/2016 9.9  9.4 - 12.3 fL Final   . Neutrophils 07/24/2016 57.1  None % Final   . Lymphocytes Automated 07/24/2016 32.0  None % Final   . Monocytes 07/24/2016 8.5  None % Final   . Eosinophils Automated 07/24/2016 1.4  None % Final   . Basophils Automated 07/24/2016 0.8  None % Final   . Immature Granulocyte 07/24/2016 0.2  None % Final   . Nucleated RBC 07/24/2016 0.0  0.0 - 1.0 /100 WBC Final   . Neutrophils Absolute 07/24/2016 3.55  1.80 - 8.10 x10 3/uL Final   . Abs Lymph Automated 07/24/2016 1.99  0.50 - 4.40 x10 3/uL Final   . Abs Mono Automated 07/24/2016 0.53  0.00 - 1.20 x10 3/uL Final   . Abs Eos Automated 07/24/2016 0.09  0.00 - 0.70 x10 3/uL Final   . Absolute Baso Automated 07/24/2016 0.05  0.00 - 0.20 x10 3/uL Final   . Absolute Immature Granulocyte 07/24/2016 0.01  0 x10 3/uL Final   . Absolute NRBC 07/24/2016 0.00  0 x10 3/uL Final   . Glucose 07/24/2016 104* 70 - 100 mg/dL Final    Comment: ADA guidelines for diabetes mellitus:  Fasting:  Equal to or greater than 126 mg/dL  Random:   Equal to or greater than 200 mg/dL     . BUN 07/24/2016 11.0  7.0 - 19.0 mg/dL Final   . Creatinine 14/78/2956 0.7  0.6 - 1.0 mg/dL Final   . Sodium 21/30/8657 140  136 - 145 mEq/L Final   . Potassium 07/24/2016 3.8  3.5 -  5.1 mEq/L Final   . Chloride 07/24/2016 106  100 - 111 mEq/L Final   . CO2 07/24/2016 26  22 - 29 mEq/L Final   . Calcium 07/24/2016 9.8  8.5 - 10.5 mg/dL Final   . Protein, Total 07/24/2016 6.6  6.0 - 8.3 g/dL Final   . Albumin 84/69/6295 3.9  3.5 - 5.0 g/dL Final   . AST (SGOT) 28/41/3244 19  5 - 34 U/L Final   . ALT 07/24/2016 17  0 - 55 U/L Final   . Alkaline Phosphatase 07/24/2016 117* 37 - 106 U/L Final   . Bilirubin, Total 07/24/2016 0.4  0.2 - 1.2 mg/dL Final   . Globulin 11/04/7251 2.7  2.0 - 3.6 g/dL Final   . Albumin/Globulin Ratio 07/24/2016 1.4  0.9 - 2.2 Final   . Anion Gap 07/24/2016 8.0  5.0 - 15.0 Final   . Alcohol 07/24/2016 None Detected  None Detected mg/dL Final    Comment: Flushing, slowing of reflexes, impaired visual activity:  50-100 mg/dL  Depression of CNS: Greater than 100 mg/dL  Fatalities reported: Greater than 400 mg/dL  Percent weight per volume equals mg/dL divided by 1610     . Thyroid Stimulating Hormone 07/25/2016 1.89  0.35 - 4.94 uIU/mL Final   . Troponin I 07/24/2016 <0.01  0.00 - 0.09 ng/mL Final    Comment: Results equal to or greater than 0.3 ng/mL are considered  positive for AMI.  Results less than 0.1 ng/mL are  negative for AMI.  If Troponin I is between 0.1 and 0.29 ng/mL, it should be  repeated if myocardial injury is still a clinical  consideration.  The triage of patients with chest pain should be based on  serial samples.  Troponin I results should be interpreted  in the context of the overall clinical picture including  clinical history, ECG and other laboratory tests.  The presence of heterophilic antibodies in certain  individuals can interfere with immunoassays and cause  false positive results.     . Urine Type 07/24/2016 Clean Catch   Final   . Color, UA 07/24/2016 Yellow  Clear - Yellow Final   . Clarity, UA 07/24/2016 Clear  Clear - Hazy Final   . Specific Gravity UA 07/24/2016 1.000  1.001 - 1.035 Final   . Urine pH 07/24/2016 6.0  5.0 - 8.0 Final   .  Leukocyte Esterase, UA 07/24/2016 NEGATIVE  Negative Final   . Nitrite, UA 07/24/2016 NEGATIVE  Negative Final   . Protein, UR 07/24/2016 NEGATIVE  Negative Final   . Glucose, UA 07/24/2016 NEGATIVE  Negative Final   . Ketones UA 07/24/2016 NEGATIVE  Negative Final   . Urobilinogen, UA 07/24/2016 <2.0  0.2 - 2.0 mg/dL Final   . Bilirubin, UA 07/24/2016 NEGATIVE  Negative Final   . Blood, UA 07/24/2016 NEGATIVE  Negative Final   . EGFR 07/24/2016 >60.0   Final    Comment: Disease State Reference Ranges:    Chronic Kidney Disease; < 60 ml/min/1.73 sq.m    Kidney Failure; < 15 ml/min/1.73 sq.m    [Calculated using IDMS-Traceable MDRD equation (based on    gender, age and black vs. non-black race) recommended by    [employer] Kidney Disease Education Program. No data    available for non-white, non-black race.]  GFR estimates are unreliable in patients with:    Rapidly changing kidney function or recent dialysis,    extreme age, body size or body composition(obesity,    severe malnutrition). Abnormal muscle mass (limb    amputation, muscle wasting). In these patients,    alternative determinations of GFR should be obtained.     . Amphetamine Screen, UR 07/24/2016 Negative   Final   . Barbiturate Screen, UR 07/24/2016 Negative  Negative Final   . Benzodiazepine Screen, UR 07/24/2016 Negative  Negative Final   . Cannabinoid Screen, UR 07/24/2016 Negative  Negative Final   . Cocaine, UR 07/24/2016 Negative  Negative Final   . Opiate Screen, UR 07/24/2016 Negative  Negative Final   . PCP Screen, UR 07/24/2016 Negative  Negative Final    Comment: Testing performed on [employer] instrumentation.  Samples with a reaction greater than or equal to the following  cutoff levels yield a presumptively Positive result:  Amphetamines           1000 ng/mL  Barbiturates  200 ng/mL  Benzodiazepines         200 ng/mL  Cannabinoids (THC)       50 ng/mL  Cocaine                 300 ng/ml  Opiates                 300 ng/mL  PCP                       25 ng/mL  Drug Screens are unconfirmed and are for medical use only.  Call Lab within 1 week if GCMS confirmation is required.  If drug screen is negative and drug/medication use is still  suspected, order a blood and urine toxicology screen.  The Opiates screen is primarily for detection of morphine,  heroin and codeine. Consumption of poppy seeds may produce a  positive opiates screening result. The THC screen does not  detect synthetic cannabinoid-like compounds.     Auto Release Encounter on 07/03/2016   Component Date Value Ref Range Status   . Vitamin D, 25-OH, Total 07/24/2016 46  30 - 100 ng/mL Final   . Vitamin D, 25-OH, D3 07/24/2016 41  ng/mL Final   . 25-Hydroxy D2 07/24/2016 5  ng/mL Final    Comment: 25-OHD3 indicates both endogenous production and  supplementation. 25-OHD2 is an indicator of  exogenous sources such as diet or supplementation.  Therapy is based on measurement of Total 25-OHD,  with levels <20 ng/mL indicative of Vitamin D  deficiency while levels between 20 ng/mL and 30  ng/mL suggest insufficiency. Optimal levels are  > or = 30 ng/mL.  For more information on this test, go to  http://education.[facility].com/faq/FAQ163     Admission on 06/24/2016, Discharged on 06/24/2016   Component Date Value Ref Range Status   . Ventricular Rate 06/25/2016 64  BPM Final   . Atrial Rate 06/25/2016 64  BPM Final   . P-R Interval 06/25/2016 150  ms Final   . QRS Duration 06/25/2016 74  ms Final   . Q-T Interval 06/25/2016 406  ms Final   . QTC Calculation (Bezet) 06/25/2016 418  ms Final   . P Axis 06/25/2016 34  degrees Final   . R Axis 06/25/2016 -9  degrees Final   . T Axis 06/25/2016 14  degrees Final   . WBC 06/24/2016 5.80  3.50 - 10.80 x10 3/uL Final   . Hgb 06/24/2016 13.3  12.0 - 16.0 g/dL Final   . Hematocrit 16/08/9603 39.8  37.0 - 47.0 % Final   . Platelets 06/24/2016 274  140 - 400 x10 3/uL Final   . RBC 06/24/2016 4.72  4.20 - 5.40 x10 6/uL Final   . MCV 06/24/2016 84.3   80.0 - 100.0 fL Final   . MCH 06/24/2016 28.2  28.0 - 32.0 pg Final   . MCHC 06/24/2016 33.4  32.0 - 36.0 g/dL Final   . RDW 54/07/8118 14  12 - 15 % Final   . MPV 06/24/2016 10.0  9.4 - 12.3 fL Final   . Neutrophils 06/24/2016 52.6  None % Final   . Lymphocytes Automated 06/24/2016 35.9  None % Final   . Monocytes 06/24/2016 9.0  None % Final   . Eosinophils Automated 06/24/2016 1.6  None % Final   . Basophils Automated 06/24/2016 0.7  None % Final   . Immature Granulocyte 06/24/2016 0.2  None % Final   . Nucleated  RBC 06/24/2016 0.0  0.0 - 1.0 /100 WBC Final   . Neutrophils Absolute 06/24/2016 3.06  1.80 - 8.10 x10 3/uL Final   . Abs Lymph Automated 06/24/2016 2.08  0.50 - 4.40 x10 3/uL Final   . Abs Mono Automated 06/24/2016 0.52  0.00 - 1.20 x10 3/uL Final   . Abs Eos Automated 06/24/2016 0.09  0.00 - 0.70 x10 3/uL Final   . Absolute Baso Automated 06/24/2016 0.04  0.00 - 0.20 x10 3/uL Final   . Absolute Immature Granulocyte 06/24/2016 0.01  0 x10 3/uL Final   . Absolute NRBC 06/24/2016 0.00  0 x10 3/uL Final   . Glucose 06/24/2016 79  70 - 100 mg/dL Final    Comment: ADA guidelines for diabetes mellitus:  Fasting:  Equal to or greater than 126 mg/dL  Random:   Equal to or greater than 200 mg/dL     . BUN 06/24/2016 15.0  7.0 - 19.0 mg/dL Final   . Creatinine 96/02/5408 0.7  0.6 - 1.0 mg/dL Final   . Sodium 81/19/1478 143  136 - 145 mEq/L Final   . Potassium 06/24/2016 3.6  3.5 - 5.1 mEq/L Final   . Chloride 06/24/2016 105  100 - 111 mEq/L Final   . CO2 06/24/2016 24  22 - 29 mEq/L Final   . Calcium 06/24/2016 10.1  8.5 - 10.5 mg/dL Final   . Protein, Total 06/24/2016 7.0  6.0 - 8.3 g/dL Final   . Albumin 29/56/2130 4.1  3.5 - 5.0 g/dL Final   . AST (SGOT) 86/57/8469 17  5 - 34 U/L Final   . ALT 06/24/2016 13  0 - 55 U/L Final   . Alkaline Phosphatase 06/24/2016 111* 37 - 106 U/L Final   . Bilirubin, Total 06/24/2016 0.4  0.2 - 1.2 mg/dL Final   . Globulin 62/95/2841 2.9  2.0 - 3.6 g/dL Final   .  Albumin/Globulin Ratio 06/24/2016 1.4  0.9 - 2.2 Final   . Anion Gap 06/24/2016 14.0  5.0 - 15.0 Final   . Troponin I 06/24/2016 <0.01  0.00 - 0.09 ng/mL Final    Comment: Results equal to or greater than 0.3 ng/mL are considered  positive for AMI.  Results less than 0.1 ng/mL are  negative for AMI.  If Troponin I is between 0.1 and 0.29 ng/mL, it should be  repeated if myocardial injury is still a clinical  consideration.  The triage of patients with chest pain should be based on  serial samples.  Troponin I results should be interpreted  in the context of the overall clinical picture including  clinical history, ECG and other laboratory tests.  The presence of heterophilic antibodies in certain  individuals can interfere with immunoassays and cause  false positive results.     . Magnesium 06/24/2016 2.2  1.6 - 2.6 mg/dL Final   . Lipase 32/44/0102 9  8 - 78 U/L Final   . Thyroid Stimulating Hormone 06/25/2016 2.00  0.35 - 4.94 uIU/mL Final   . EGFR 06/24/2016 >60.0   Final    Comment: Disease State Reference Ranges:    Chronic Kidney Disease; < 60 ml/min/1.73 sq.m    Kidney Failure; < 15 ml/min/1.73 sq.m    [Calculated using IDMS-Traceable MDRD equation (based on    gender, age and black vs. non-black race) recommended by    [employer] Disease Education Program. No data    available for non-white, non-black race.]  GFR estimates are unreliable in patients with:  Rapidly changing kidney function or recent dialysis,    extreme age, body size or body composition(obesity,    severe malnutrition). Abnormal muscle mass (limb    amputation, muscle wasting). In these patients,    alternative determinations of GFR should be obtained.     . Ventricular Rate 06/25/2016 64  BPM Preliminary   . Atrial Rate 06/25/2016 64  BPM Preliminary   . P-R Interval 06/25/2016 150  ms Preliminary   . QRS Duration 06/25/2016 74  ms Preliminary   . Q-T Interval 06/25/2016 406  ms Preliminary   . QTC Calculation (Bezet) 06/25/2016 418   ms Preliminary   . P Axis 06/25/2016 34  degrees Preliminary   . R Axis 06/25/2016 -9  degrees Preliminary   . T Axis 06/25/2016 14  degrees Preliminary   Admission on 05/23/2016, Discharged on 05/23/2016   Component Date Value Ref Range Status   . Ventricular Rate 05/24/2016 71  BPM Final   . Atrial Rate 05/24/2016 71  BPM Final   . P-R Interval 05/24/2016 164  ms Final   . QRS Duration 05/24/2016 78  ms Final   . Q-T Interval 05/24/2016 384  ms Final   . QTC Calculation (Bezet) 05/24/2016 417  ms Final   . P Axis 05/24/2016 38  degrees Final   . R Axis 05/24/2016 -4  degrees Final   . T Axis 05/24/2016 23  degrees Final   . Ventricular Rate 05/24/2016 71  BPM Preliminary   . Atrial Rate 05/24/2016 71  BPM Preliminary   . P-R Interval 05/24/2016 164  ms Preliminary   . QRS Duration 05/24/2016 78  ms Preliminary   . Q-T Interval 05/24/2016 384  ms Preliminary   . QTC Calculation (Bezet) 05/24/2016 417  ms Preliminary   . P Axis 05/24/2016 38  degrees Preliminary   . R Axis 05/24/2016 -4  degrees Preliminary   . T Axis 05/24/2016 23  degrees Preliminary       US Abdomen Complete    Result Date: 08/09/2016  CLINICAL INDICATION: Right upper quadrant pain status post cholecystectomy. TECHNIQUE:  Grayscale and color assessment of the abdomen was performed by the ultrasound technologist.  The saved images were reviewed by the radiologist. FINDINGS: Liver: Within normal limits. No intrahepatic biliary dilatation is appreciated. Spleen: Within normal limits. Gallbladder: Absent. Common bile duct: Normal in caliber measuring 5 mm. Pancreas: Limited assessment; however, visualized portions are unremarkable. Kidneys: No evidence of hydronephrosis.  No solid renal lesions are appreciated. Simple cyst within the lower pole of the right kidney measures 2.1 x 1.9 x 2.0 cm. Size within normal limits.   Misc: The visualized IVC and aorta are unremarkable.         No evidence of acute intra-abdominal process. Gustavus Messing, MD 08/09/2016  11:10 AM     Ct Abd/pelvis With Iv And Po Contrast    Result Date: 07/24/2016  HISTORY: Generalized abdominal pain and tenderness and distention, comparison is made with prior study from May of this year. Previous gastric surgery COMPARISON: As above TECHNIQUE: Contiguous axial sections were obtained from the domes of the diaphragm through the pelvis following administration of 100 cc of intravenous contrast. Axial images along with sagittal and coronal reconstructions were reviewed. A combination of automated exposure control, adjustment of the mA and/or kV according to patient size and/or use of iterative reconstruction technique was utilized. CT Scan of the Abdomen: FINDINGS: No significant abnormality is seen in the lung bases.  The liver and spleen appear within normal limits. No focal lesions are seen. Again, the patient is status post cholecystectomy. Mild intrahepatic biliary dilatation is again noted. Benign-appearing cyst is again seen superiorly in the right lobe the liver.. No pneumobilia is seen. No significant abnormality is seen in the adrenal glands, kidneys, aorta, and retroperitoneum. No abdominal or retroperitoneal lymphadenopathy is detected.  No free intraperitoneal air or fluid is detected. Again, there are findings consistent with prior gastric bypass surgery. Small bowel demonstrates some mild dilatation distally without a focal obstruction. Cecum is in normal position in the right lower quadrant. There is no free air or obstruction seen. No hernia is seen. CT Scan of the Pelvis: FINDINGS: Uterus unremarkable. Rectum is distended with stool and air. Sigmoid colon is not distended. No hernia or lymphadenopathy seen in the groin. No ascites or free air is noted in the pelvis. The bladder is not distended. No acute abnormality seen in the spine or bony pelvis.     1. Postop with no free air or acute obstruction seen. Nonspecific changes are again noted in the small bowel as previously reported. 2.  Status post cholecystectomy with no change in the appearance of the biliary tree compared to prior studies. Charlene Brooke, MD 07/24/2016 10:51 PM   .         US Abdomen Complete    Result Date: 08/09/2016  CLINICAL INDICATION: Right upper quadrant pain status post cholecystectomy. TECHNIQUE:  Grayscale and color assessment of the abdomen was performed by the ultrasound technologist.  The saved images were reviewed by the radiologist. FINDINGS: Liver: Within normal limits. No intrahepatic biliary dilatation is appreciated. Spleen: Within normal limits. Gallbladder: Absent. Common bile duct: Normal in caliber measuring 5 mm. Pancreas: Limited assessment; however, visualized portions are unremarkable. Kidneys: No evidence of hydronephrosis.  No solid renal lesions are appreciated. Simple cyst within the lower pole of the right kidney measures 2.1 x 1.9 x 2.0 cm. Size within normal limits.   Misc: The visualized IVC and aorta are unremarkable.         No evidence of acute intra-abdominal process. Gustavus Messing, MD 08/09/2016 11:10 AM     Ct Abd/pelvis With Iv And Po Contrast    Result Date: 07/24/2016  HISTORY: Generalized abdominal pain and tenderness and distention, comparison is made with prior study from May of this year. Previous gastric surgery COMPARISON: As above TECHNIQUE: Contiguous axial sections were obtained from the domes of the diaphragm through the pelvis following administration of 100 cc of intravenous contrast. Axial images along with sagittal and coronal reconstructions were reviewed. A combination of automated exposure control, adjustment of the mA and/or kV according to patient size and/or use of iterative reconstruction technique was utilized. CT Scan of the Abdomen: FINDINGS: No significant abnormality is seen in the lung bases. The liver and spleen appear within normal limits. No focal lesions are seen. Again, the patient is status post cholecystectomy. Mild intrahepatic biliary dilatation is  again noted. Benign-appearing cyst is again seen superiorly in the right lobe the liver.. No pneumobilia is seen. No significant abnormality is seen in the adrenal glands, kidneys, aorta, and retroperitoneum. No abdominal or retroperitoneal lymphadenopathy is detected.  No free intraperitoneal air or fluid is detected. Again, there are findings consistent with prior gastric bypass surgery. Small bowel demonstrates some mild dilatation distally without a focal obstruction. Cecum is in normal position in the right lower quadrant. There is no free air or obstruction seen. No  hernia is seen. CT Scan of the Pelvis: FINDINGS: Uterus unremarkable. Rectum is distended with stool and air. Sigmoid colon is not distended. No hernia or lymphadenopathy seen in the groin. No ascites or free air is noted in the pelvis. The bladder is not distended. No acute abnormality seen in the spine or bony pelvis.     1. Postop with no free air or acute obstruction seen. Nonspecific changes are again noted in the small bowel as previously reported. 2. Status post cholecystectomy with no change in the appearance of the biliary tree compared to prior studies. Charlene Brooke, MD 07/24/2016 10:51 PM       Ellsworth Waldschmidt R. Terria Deschepper, DO, FACS, FASMBS, FACOS

## 2016-08-19 ENCOUNTER — Encounter (INDEPENDENT_AMBULATORY_CARE_PROVIDER_SITE_OTHER): Payer: Self-pay | Admitting: Internal Medicine

## 2016-09-04 ENCOUNTER — Emergency Department
Admission: EM | Admit: 2016-09-04 | Discharge: 2016-09-04 | Disposition: A | Discharge status: Home / Self Care | Payer: Commercial Managed Care - HMO | Attending: Emergency Medicine | Admitting: Emergency Medicine

## 2016-09-04 ENCOUNTER — Emergency Department: Payer: Commercial Managed Care - HMO

## 2016-09-04 DIAGNOSIS — E785 Hyperlipidemia, unspecified: Secondary | ICD-10-CM | POA: Insufficient documentation

## 2016-09-04 DIAGNOSIS — I10 Essential (primary) hypertension: Secondary | ICD-10-CM | POA: Insufficient documentation

## 2016-09-04 DIAGNOSIS — K219 Gastro-esophageal reflux disease without esophagitis: Secondary | ICD-10-CM | POA: Insufficient documentation

## 2016-09-04 DIAGNOSIS — Z7982 Long term (current) use of aspirin: Secondary | ICD-10-CM | POA: Insufficient documentation

## 2016-09-04 DIAGNOSIS — K449 Diaphragmatic hernia without obstruction or gangrene: Secondary | ICD-10-CM | POA: Insufficient documentation

## 2016-09-04 DIAGNOSIS — Z9884 Bariatric surgery status: Secondary | ICD-10-CM | POA: Insufficient documentation

## 2016-09-04 DIAGNOSIS — R1010 Upper abdominal pain, unspecified: Secondary | ICD-10-CM | POA: Insufficient documentation

## 2016-09-04 DIAGNOSIS — Z8673 Personal history of transient ischemic attack (TIA), and cerebral infarction without residual deficits: Secondary | ICD-10-CM | POA: Insufficient documentation

## 2016-09-04 DIAGNOSIS — Z79899 Other long term (current) drug therapy: Secondary | ICD-10-CM | POA: Insufficient documentation

## 2016-09-04 LAB — CBC AND DIFFERENTIAL
Absolute NRBC: 0 10*3/uL
Basophils Absolute Automated: 0.04 10*3/uL (ref 0.00–0.20)
Basophils Automated: 0.6 %
Eosinophils Absolute Automated: 0.06 10*3/uL (ref 0.00–0.70)
Eosinophils Automated: 0.8 %
Hematocrit: 38.8 % (ref 37.0–47.0)
Hgb: 13 g/dL (ref 12.0–16.0)
Immature Granulocytes Absolute: 0.02 10*3/uL
Immature Granulocytes: 0.3 %
Lymphocytes Absolute Automated: 1.94 10*3/uL (ref 0.50–4.40)
Lymphocytes Automated: 27.4 %
MCH: 28.1 pg (ref 28.0–32.0)
MCHC: 33.5 g/dL (ref 32.0–36.0)
MCV: 84 fL (ref 80.0–100.0)
MPV: 9.6 fL (ref 9.4–12.3)
Monocytes Absolute Automated: 0.61 10*3/uL (ref 0.00–1.20)
Monocytes: 8.6 %
Neutrophils Absolute: 4.4 10*3/uL (ref 1.80–8.10)
Neutrophils: 62.3 %
Nucleated RBC: 0 /100 WBC (ref 0.0–1.0)
Platelets: 280 10*3/uL (ref 140–400)
RBC: 4.62 10*6/uL (ref 4.20–5.40)
RDW: 15 % (ref 12–15)
WBC: 7.07 10*3/uL (ref 3.50–10.80)

## 2016-09-04 LAB — TROPONIN I: Troponin I: <0.01 ng/mL (ref 0.00–0.09)

## 2016-09-04 LAB — COMPREHENSIVE METABOLIC PANEL
ALT: 16 U/L (ref 0–55)
AST (SGOT): 18 U/L (ref 5–34)
Albumin/Globulin Ratio: 1.4 (ref 0.9–2.2)
Albumin: 3.9 g/dL (ref 3.5–5.0)
Alkaline Phosphatase: 102 U/L (ref 37–106)
Anion Gap: 11 (ref 5.0–15.0)
BUN: 12 mg/dL (ref 7.0–19.0)
Bilirubin, Total: 0.5 mg/dL (ref 0.2–1.2)
CO2: 24 mEq/L (ref 22–29)
Calcium: 9.8 mg/dL (ref 8.5–10.5)
Chloride: 107 mEq/L (ref 100–111)
Creatinine: 0.7 mg/dL (ref 0.6–1.0)
Globulin: 2.7 g/dL (ref 2.0–3.6)
Glucose: 95 mg/dL (ref 70–100)
Potassium: 3.5 mEq/L (ref 3.5–5.1)
Protein, Total: 6.6 g/dL (ref 6.0–8.3)
Sodium: 142 mEq/L (ref 136–145)

## 2016-09-04 LAB — PT AND APTT
PT INR: 1.1 (ref 0.9–1.1)
PT: 14.4 s (ref 12.6–15.0)
PTT: 27 s (ref 23–37)

## 2016-09-04 LAB — ECG 12-LEAD
Atrial Rate: 69 {beats}/min
P Axis: 47 degrees
P-R Interval: 152 ms
Q-T Interval: 388 ms
QRS Duration: 80 ms
QTC Calculation (Bezet): 415 ms
R Axis: 4 degrees
T Axis: 34 degrees
Ventricular Rate: 69 {beats}/min

## 2016-09-04 LAB — LIPASE: Lipase: 12 U/L (ref 8–78)

## 2016-09-04 LAB — URINALYSIS
Bilirubin, UA: NEGATIVE
Blood, UA: NEGATIVE
Glucose, UA: NEGATIVE
Ketones UA: NEGATIVE
Nitrite, UA: NEGATIVE
Protein, UR: NEGATIVE
Specific Gravity UA: 1.005 (ref 1.001–1.035)
Urine pH: 8 (ref 5.0–8.0)
Urobilinogen, UA: <2 mg/dL

## 2016-09-04 LAB — URINE MICROSCOPIC

## 2016-09-04 LAB — GFR: EGFR: >60

## 2016-09-04 MED ORDER — ONDANSETRON HCL 4 MG/2ML IJ SOLN
4.0000 mg | Freq: Once | INTRAMUSCULAR | Status: AC
Start: 2016-09-04 — End: 2016-09-04
  Administered 2016-09-04: 4 mg via INTRAVENOUS
  Filled 2016-09-04: qty 2

## 2016-09-04 MED ORDER — PROMETHAZINE HCL 25 MG/ML IJ SOLN
12.5000 mg | Freq: Once | INTRAMUSCULAR | Status: DC
Start: 2016-09-04 — End: 2016-09-04
  Filled 2016-09-04: qty 1

## 2016-09-04 MED ORDER — MISOPROSTOL 100 MCG PO TABS
200.0000 ug | ORAL_TABLET | Freq: Once | ORAL | Status: DC
Start: 2016-09-04 — End: 2016-09-04

## 2016-09-04 MED ORDER — IOHEXOL 240 MG/ML IJ SOLN
INTRAMUSCULAR | Status: AC
Start: 2016-09-04 — End: 2016-09-04
  Administered 2016-09-04: 50 mL via ORAL
  Filled 2016-09-04: qty 50

## 2016-09-04 MED ORDER — SUCRALFATE 1 G PO TABS
1.0000 g | ORAL_TABLET | Freq: Once | ORAL | Status: AC
Start: 2016-09-04 — End: 2016-09-04
  Administered 2016-09-04: 1 g via ORAL
  Filled 2016-09-04: qty 1

## 2016-09-04 MED ORDER — SUCRALFATE 1 G PO TABS
1.0000 g | ORAL_TABLET | Freq: Four times a day (QID) | ORAL | 0 refills | Status: DC
Start: 2016-09-04 — End: 2016-10-13

## 2016-09-04 MED ORDER — MORPHINE SULFATE 2 MG/ML IJ/IV SOLN (WRAP)
4.0000 mg | Freq: Once | Status: AC
Start: 2016-09-04 — End: 2016-09-04
  Administered 2016-09-04: 4 mg via INTRAVENOUS
  Filled 2016-09-04: qty 2

## 2016-09-04 MED ORDER — IOHEXOL 350 MG/ML IV SOLN
100.0000 mL | Freq: Once | INTRAVENOUS | Status: AC | PRN
Start: 2016-09-04 — End: 2016-09-04

## 2016-09-04 MED ORDER — IOHEXOL 240 MG/ML IJ SOLN
50.0000 mL | Freq: Once | INTRAMUSCULAR | Status: AC | PRN
Start: 2016-09-04 — End: 2016-09-04

## 2016-09-04 MED ORDER — MORPHINE SULFATE 10 MG/ML IJ/IV SOLN (WRAP)
8.0000 mg | Freq: Once | Status: AC
Start: 2016-09-04 — End: 2016-09-04
  Administered 2016-09-04: 8 mg via INTRAVENOUS
  Filled 2016-09-04 (×2): qty 1

## 2016-09-04 MED ORDER — MISOPROSTOL 200 MCG PO TABS
200.0000 ug | ORAL_TABLET | Freq: Four times a day (QID) | ORAL | 0 refills | Status: DC
Start: 2016-09-04 — End: 2016-10-13

## 2016-09-04 MED ORDER — IOHEXOL 350 MG/ML IV SOLN
INTRAVENOUS | Status: AC
Start: 2016-09-04 — End: 2016-09-04
  Administered 2016-09-04: 100 mL via INTRAVENOUS
  Filled 2016-09-04: qty 100

## 2016-09-04 MED ORDER — SODIUM CHLORIDE 0.9 % IV BOLUS
1000.0000 mL | Freq: Once | INTRAVENOUS | Status: AC
Start: 2016-09-04 — End: 2016-09-04
  Administered 2016-09-04: 1000 mL via INTRAVENOUS

## 2016-09-04 NOTE — ED Provider Notes (Addendum)
EMERGENCY DEPARTMENT NOTE    Physician/Midlevel provider first contact with patient: 09/04/16 1526         HISTORY OF PRESENT ILLNESS   Historian:Patient  Translator Used: No    Chief Complaint: Abdominal Pain     65 y.o. female h/o gastric bypass in 10/2016 presents to the ED with 3 hours of central abd pain associated with nausea but no vomiting.  Normal BM today.  No CP/SOB.  No fever/chills.  Patient feels somewhat lightheaded when the pain started.     1. Location of symptoms: abdomen  2. Onset of symptoms: 3 hours PTA  3. What was patient doing when symptoms started (Context): see above  4. Severity: moderate  5. Timing: constant  6. Activities that worsen symptoms: none  7. Activities that improve symptoms: none  8. Quality: ache  9. Radiation of symptoms: no  10. Associated signs and Symptoms: see above  11. Are symptoms worsening? yes  MEDICAL HISTORY     Past Medical History:  Past Medical History:   Diagnosis Date   . Chest pain 2016     Chest pain after eating x6  months--being followed by GI for GERD   . Constipation    . Difficulty walking     amb with cane secondary to arthritis bil knees   . Genital herpes     no current outbreak (10/03/15)   . GERD (gastroesophageal reflux disease)    . Hiatal hernia    . Hyperlipidemia    . Hypertensive disorder     Well controlled on med. Denies any SOB in last 6 months (10/03/15)   . Morbid obesity with BMI of 40.0-44.9, adult     BMI 40.9   . Nausea without vomiting    . OSA on CPAP 2015    CPAP nightly x 1 yr   . Osteoarthritis of knees, bilateral    . TIA (transient ischemic attack) 2014    TIA 2014-no residual. Patient evaulated for possible TIA 07/12/2015  @ [facility] (dischage summary in epic)-per summary CT of head was normal and patient was d/c'd       Past Surgical History:  Past Surgical History:   Procedure Laterality Date   . APPENDECTOMY  >25 yrs   . EGD  01/2015   . fallopian tube surgery  >25 yrs   . INSERTION, PAIN PUMP (MEDICAL) N/A 10/22/2015     Procedure: INSERTION, PAIN PUMP (MEDICAL);  Surgeon: Nicola Police, DO;  Location: [facility] OR;  Service: General;  Laterality: N/A;   . LAPAROSCOPIC, CHOLECYSTECTOMY, CHOLANGIOGRAM  08/13/2014   . LAPAROSCOPIC, GASTRIC BYPASS N/A 10/22/2015    Procedure: LAPAROSCOPIC, GASTRIC BYPASS;  Surgeon: Josefa Half R, DO;  Location: [facility];  Service: General;  Laterality: N/A;   . LAPAROSCOPIC, HERNIORRHAPHY, HIATAL N/A 10/22/2015    Procedure: LAPAROSCOPIC, HERNIORRHAPHY, HIATAL;  Surgeon: Jeanell Sparrow, Hamid R, DO;  Location: [facility];  Service: General;  Laterality: N/A;   . OVARY SURGERY Right >25 yrs   . SPINE SURGERY  11/2013    cervical HNP x 2, Dr. Bufford Buttner       Social History:  Social History     Social History   . Marital status: Single     Spouse name: N/A   . Number of children: 2   . Years of education: N/A     Occupational History   . Child psychotherapist [employer]      Social  History Main Topics   . Smoking status: Never Smoker   . Smokeless tobacco: Never Used   . Alcohol use No   . Drug use: No   . Sexual activity: No     Other Topics Concern   . Dietary Supplements / Vitamins Yes   . Anesthesia Problems No   . Blood Thinners No   . Eats Large Amounts No   . Excessive Sweets No   . Skips Meals No   . Eats Excessive Starches Yes   . Snacks Or Grazes No   . Emotional Eater Yes   . Eats Fried Food Yes   . Eats Fast Food Yes   . Diet Center No   . Hmr No   . [name] No   . La Weight Loss No   . Nutri-System No   . Opti-Fast / Medi-Fast No   . Overeaters Anonymous No   . Physicians Weight Loss Center No   . Tops No   . Weight Watchers No   . Atkins No   . Binging / Purging No   . Body For Life No   . Cabbage Soup No   . Calorie Counting No   . Fasting No   . [name] No   . Health Spa No   . Herbal Life No   . High Protein No   . Low Carb No   . Low Fat No   . [facility] Diet No   . [place] Diet No   . Margie Billet Diet No   . [place] Diet No   .  Slim Fast No   . [place] No   . Sugar Busters No   . Vomiting No   . Zone Diet No   . Stationary Cycle Or Treadmill No   . Gym/Fitness Classes No   . Home Exercise/Video No   . Swimming No   . Team Sports No   . Weight Training No   . Walking Or Running No   . Hospitalization No   . Hypnosis No   . Physical Therapy No   . Psychological Therapy No   . Residential Program No   . Acutrim No   . Amphetamines No   . Anorex No   . Byetta No   . Dexatrim No   . Didrex No   . Fastin No   . Fen - Phen No   . Ionamin / Adipex No   . Mazanor No   . Meridia No   . Obalan No   . Phendiet No   . Phentrol No   . Phenteramine Yes   . Plegine No   . Pondimin No   . Qsymia No   . Prozac No   . Redux No   . Sanorex No   . Tenuate No   . Tepanole No   . Wechless No   . Wellbutrin No   . Xenical (Orlistat, Alli) No   . Other Med No   . No Impairment Yes     Chronic Bilat Knee Pain   . Walks With Cane/Crutch Yes   . Requires A Wheelchair No   . Bedridden No   . Are You Currently Being Treated For Depression? No   . Do You Snore? Yes   . Are You Receiving Any Medical Or Psychological Services? No   . Do You Have Or Have You Been Treated For An Eating Disorder? No   .  Do You Exercise Regularly? No   . Have You Or Family Member Ever Have Trouble With Anesthesia? No     Social History Narrative   . No narrative on file       Family History:  Family History   Problem Relation Age of Onset   . Cancer Mother 44     breast cancer       Outpatient Medication:  Previous Medications    ALBUTEROL (PROVENTIL HFA;VENTOLIN HFA) 108 (90 BASE) MCG/ACT INHALER    Inhale into the lungs every 4 (four) hours as needed.       ASPIRIN 81 MG CHEWABLE TABLET    Take 81 mg by Mouth Once a Day. Indications: STOPPED    ATENOLOL (TENORMIN) 50 MG TABLET    TAKE 1 and 1/2 TABLET BY MOUTH DAILY.    DESONIDE (DESOWEN) 0.05 % CREAM    Apply topically 2 (two) times daily as needed.    DOCUSATE SODIUM (COLACE) 100 MG CAPSULE    Take 100 mg by mouth daily.         FLUTICASONE (FLONASE) 50 MCG/ACT NASAL SPRAY    2 sprays by Nasal route daily.    LIDOCAINE (XYLOCAINE) 2 % JELLY    Apply topically as needed.    LORAZEPAM (ATIVAN) 0.5 MG TABLET    Take 1 tablet (0.5 mg total) by mouth every 8 (eight) hours as needed for Anxiety.    PANTOPRAZOLE (PROTONIX) 40 MG TABLET    Take 40 mg by mouth daily.    POLYETHYLENE GLYCOL (MIRALAX) PACKET    Take 17 g by mouth daily as needed.    VALACYCLOVIR HCL (VALTREX) 500 MG TABLET    Take 1 tablet (500 mg total) by mouth daily.         REVIEW OF SYSTEMS   Review of Systems   Constitutional: Negative for chills and fever.   Respiratory: Negative for cough and shortness of breath.    Cardiovascular: Negative for chest pain and leg swelling.   Gastrointestinal: Positive for abdominal pain and nausea. Negative for constipation, diarrhea and vomiting.   Genitourinary: Negative for dysuria and hematuria.   Musculoskeletal: Negative for back pain and neck pain.   Neurological: Negative for sensory change, speech change, focal weakness and loss of consciousness.   All other systems reviewed and are negative.      PHYSICAL EXAM     ED Triage Vitals [09/04/16 1538]   Enc Vitals Group      BP 160/80      Heart Rate 76      Resp Rate 18      Temp 98 F (36.7 C)      Temp src       SpO2 99 %      Weight 62.1 kg      Height       Head Circumference       Peak Flow       Pain Score 10      Pain Loc       Pain Edu?       Excl. in GC?    Nursing note and vitals reviewed.  Constitutional:  Well developed, well nourished.  Awake & alert.    Head:  Atraumatic.  Normocephalic.    ENT:  Mucous membranes are moist and intact.  Patent airway.  Neck:  Supple.  No JVD.   Cardiovascular:  Regular rate.  Regular rhythm.   Pulmonary/Chest:  No evidence of respiratory  distress.  Clear to auscultation bilaterally.  No wheezing, rales or rhonchi.   Abdominal:  Soft and non-distended.  There is moderate diffuse tenderness.  No rebound, guarding, or rigidity.  No bruit. No  masses palpable  Back:  No CVA tenderness.   Extremities:  No edema.   No cyanosis.  No clubbing.  2+ DP/PT/radial pulses b/l.  Skin:  Skin is warm and dry.  No diaphoresis.    Neurological:  Alert, awake, and appropriate.  Normal speech.  Moves all extremities.  Normal gate.  Psychiatric:  Good eye contact.  Normal interaction, affect, and behavior    MEDICAL DECISION MAKING     DISCUSSION      ED Course    CT abd/pelvis r/o SBO.  Patient signed out to Dr. Ralph Dowdy pending CT results.    Vital Signs: Reviewed the patient?s vital signs.   Nursing Notes: Reviewed and utilized available nursing notes.  Medical Records Reviewed: Reviewed available past medical records.  Counseling: The emergency provider has spoken with the patient and discussed today?s findings, in addition to providing specific details for the plan of care.  Questions are answered and there is agreement with the plan.      CARDIAC STUDIES    The following cardiac studies were independently interpreted by the Emergency Medicine Physician.  For full cardiac study results please see chart.    Monitor Strip  Interpreted by ED Physician  Rate: 60  Rhythm: NSR   ST Changes: none    EKG Interpretation:  Signed and interpreted by ED Provider   Time Interpreted: 1553  Rate: 69  Rhythm: NSR  Axis: normal  Intervals: normal  Blocks: none  ST segments: no acute changes  Interpretation: Nonspecific  EKG    EMERGENCY IMAGING STUDIES    The following imagine studies were independently interpreted by me (emergency physician):    Radiology:  Interpreted by me (ED Physician)  Study: Chest Xray   Results: No infiltrate. No pneumothorax. No hemothorax. No cardiomegaly. No CHF.  Impression: No acute intrathoracic abnormality.      RADIOLOGY IMAGING STUDIES      Chest AP Portable   Final Result    No active disease      Kinnie Feil, MD    09/04/2016 4:12 PM         CT Abd/Pelvis with IV and PO Contrast    (Results Pending)         PULSE OXIMETRY    Oxygen Saturation by  Pulse Oximetry: 97%  Interventions: none  Interpretation:  normal    EMERGENCY DEPT. MEDICATIONS      ED Medication Orders     Start Ordered     Status Ordering Provider    09/04/16 1733 09/04/16 1733  iohexol (OMNIPAQUE) 240 MG/ML solution     Comments:  Created by cabinet override    Ordered     09/04/16 1718 09/04/16 1718  morphine injection 8 mg  Once     Route: Intravenous  Ordered Dose: 8 mg     Last MAR action:  Given Caidyn Blossom D    09/04/16 1558 09/04/16 1557  sodium chloride 0.9 % bolus 1,000 mL  Once     Route: Intravenous  Ordered Dose: 1,000 mL     Last MAR action:  New Bag Bethann Berkshire D    09/04/16 1558 09/04/16 1557  morphine injection 4 mg  Once     Route: Intravenous  Ordered Dose: 4 mg  Last MAR action:  Given Almalik Weissberg D    09/04/16 1558 09/04/16 1557  ondansetron (ZOFRAN) injection 4 mg  Once     Route: Intravenous  Ordered Dose: 4 mg     Last MAR action:  Given Poet Hineman D          LABORATORY RESULTS    Ordered and independently interpreted AVAILABLE laboratory tests. Please see results section in chart for full details.  Results for orders placed or performed during the hospital encounter of 09/04/16   CBC with differential   Result Value Ref Range    WBC 7.07 3.50 - 10.80 x10 3/uL    Hgb 13.0 12.0 - 16.0 g/dL    Hematocrit 16.1 09.6 - 47.0 %    Platelets 280 140 - 400 x10 3/uL    RBC 4.62 4.20 - 5.40 x10 6/uL    MCV 84.0 80.0 - 100.0 fL    MCH 28.1 28.0 - 32.0 pg    MCHC 33.5 32.0 - 36.0 g/dL    RDW 15 12 - 15 %    MPV 9.6 9.4 - 12.3 fL    Neutrophils 62.3 None %    Lymphocytes Automated 27.4 None %    Monocytes 8.6 None %    Eosinophils Automated 0.8 None %    Basophils Automated 0.6 None %    Immature Granulocyte 0.3 None %    Nucleated RBC 0.0 0.0 - 1.0 /100 WBC    Neutrophils Absolute 4.40 1.80 - 8.10 x10 3/uL    Abs Lymph Automated 1.94 0.50 - 4.40 x10 3/uL    Abs Mono Automated 0.61 0.00 - 1.20 x10 3/uL    Abs Eos Automated 0.06 0.00 - 0.70 x10 3/uL     Absolute Baso Automated 0.04 0.00 - 0.20 x10 3/uL    Absolute Immature Granulocyte 0.02 0 x10 3/uL    Absolute NRBC 0.00 0 x10 3/uL   PT/APTT   Result Value Ref Range    PT 14.4 12.6 - 15.0 sec    PT INR 1.1 0.9 - 1.1    PT Anticoag. Given Within 48 hrs. None     PTT 27 23 - 37 sec   Comprehensive metabolic panel   Result Value Ref Range    Glucose 95 70 - 100 mg/dL    BUN 04.5 7.0 - 40.9 mg/dL    Creatinine 0.7 0.6 - 1.0 mg/dL    Sodium 811 914 - 782 mEq/L    Potassium 3.5 3.5 - 5.1 mEq/L    Chloride 107 100 - 111 mEq/L    CO2 24 22 - 29 mEq/L    Calcium 9.8 8.5 - 10.5 mg/dL    Protein, Total 6.6 6.0 - 8.3 g/dL    Albumin 3.9 3.5 - 5.0 g/dL    AST (SGOT) 18 5 - 34 U/L    ALT 16 0 - 55 U/L    Alkaline Phosphatase 102 37 - 106 U/L    Bilirubin, Total 0.5 0.2 - 1.2 mg/dL    Globulin 2.7 2.0 - 3.6 g/dL    Albumin/Globulin Ratio 1.4 0.9 - 2.2    Anion Gap 11.0 5.0 - 15.0   Lipase   Result Value Ref Range    Lipase 12 8 - 78 U/L   Troponin I   Result Value Ref Range    Troponin I <0.01 0.00 - 0.09 ng/mL   GFR   Result Value Ref Range    EGFR >60.0    UA  with reflex to micro (pts 3 + yrs)   Result Value Ref Range    Urine Type Clean Catch     Color, UA Colorless Clear - Yellow    Clarity, UA Clear Clear - Hazy    Specific Gravity UA 1.005 1.001 - 1.035    Urine pH 8.0 5.0 - 8.0    Leukocyte Esterase, UA SMALL (A) Negative    Nitrite, UA NEGATIVE Negative    Protein, UR NEGATIVE Negative    Glucose, UA NEGATIVE Negative    Ketones UA NEGATIVE Negative    Urobilinogen, UA <2.0 0.2 - 2.0 mg/dL    Bilirubin, UA NEGATIVE Negative    Blood, UA NEGATIVE Negative   Microscopic, Urine   Result Value Ref Range    WBC, UA 6 - 10 (A) 0 - 5 /hpf    Squamous Epithelial Cells, Urine 0 - 5 0 - 25 /hpf    Urine Amorphous Few (A) Occasional /hpf   ECG 12 Lead   Result Value Ref Range    Ventricular Rate 69 BPM    Atrial Rate 69 BPM    P-R Interval 152 ms    QRS Duration 80 ms    Q-T Interval 388 ms    QTC Calculation (Bezet) 415 ms    P  Axis 47 degrees    R Axis 4 degrees    T Axis 34 degrees       CRITICAL CARE/PROCEDURES    Procedures    DIAGNOSIS    The following dispo is per Dr. Ralph Dowdy.  Please see his note for details  Diagnosis:  Final diagnoses:   Pain of upper abdomen       Disposition:  ED Disposition     ED Disposition Condition Date/Time Comment    Discharge  Fri Sep 04, 2016  9:05 PM [name] discharge to home/self care.    Condition at disposition: Stable          Prescriptions:  Patient's Medications   New Prescriptions    No medications on file   Previous Medications    ALBUTEROL (PROVENTIL HFA;VENTOLIN HFA) 108 (90 BASE) MCG/ACT INHALER    Inhale into the lungs every 4 (four) hours as needed.       ASPIRIN 81 MG CHEWABLE TABLET    Take 81 mg by Mouth Once a Day. Indications: STOPPED    ATENOLOL (TENORMIN) 50 MG TABLET    TAKE 1 and 1/2 TABLET BY MOUTH DAILY.    DESONIDE (DESOWEN) 0.05 % CREAM    Apply topically 2 (two) times daily as needed.    DOCUSATE SODIUM (COLACE) 100 MG CAPSULE    Take 100 mg by mouth daily.        FLUTICASONE (FLONASE) 50 MCG/ACT NASAL SPRAY    2 sprays by Nasal route daily.    LIDOCAINE (XYLOCAINE) 2 % JELLY    Apply topically as needed.    LORAZEPAM (ATIVAN) 0.5 MG TABLET    Take 1 tablet (0.5 mg total) by mouth every 8 (eight) hours as needed for Anxiety.    PANTOPRAZOLE (PROTONIX) 40 MG TABLET    Take 40 mg by mouth daily.    POLYETHYLENE GLYCOL (MIRALAX) PACKET    Take 17 g by mouth daily as needed.    VALACYCLOVIR HCL (VALTREX) 500 MG TABLET    Take 1 tablet (500 mg total) by mouth daily.   Modified Medications    No medications on file   Discontinued Medications  No medications on file        Shela Nevin, MD  09/04/16 1940       Shela Nevin, MD  09/05/16 1212

## 2016-09-04 NOTE — Discharge Instructions (Signed)
Abdominal Pain    You have been diagnosed with abdominal (belly) pain. The cause of your pain is not yet known.    Many things can cause abdominal pain. Examples include viral infections and bowel (intestine) spasms. You might need another examination or more tests to find out why you have pain.    At this time, your pain does not seem to be caused by anything dangerous. You do not need surgery. You do not need to stay in the hospital.     Though we don't believe your condition is dangerous right now, it is important to be careful. Sometimes a problem that seems mild can become serious later. This is why it is very important that you return here or go to the nearest Emergency Department unless you are 100% improved.    YOU SHOULD SEEK MEDICAL ATTENTION IMMEDIATELY, EITHER HERE OR AT THE NEAREST EMERGENCY DEPARTMENT, IF ANY OF THE FOLLOWING OCCURS:   Your pain does not go away or gets worse.   You cannot keep fluids down or your vomit is dark green.    You vomit blood or see blood in your stool. Blood might be bright red or dark red. It can also be black and look like tar.   You have a fever (temperature higher than 100.4F / 38C) or shaking chills.   Your skin or eyes look yellow or your urine looks brown.   You have severe diarrhea.

## 2016-09-04 NOTE — ED Provider Notes (Signed)
CT Abd/Pelvis with IV and PO Contrast   Final Result          No acute process.      Findings likely represent slow small bowel transit. No evidence of bowel   obstruction.      Stable gastric bypass surgical changes      Prominent bladder could be secondary to urinary retention.      Max Fickle, MD    09/04/2016 8:02 PM         Chest AP Portable   Final Result    No active disease      Kinnie Feil, MD    09/04/2016 4:12 PM             Labs Reviewed   URINALYSIS - Abnormal; Notable for the following:        Result Value    Leukocyte Esterase, UA SMALL (*)     All other components within normal limits   URINE MICROSCOPIC - Abnormal; Notable for the following:     WBC, UA 6 - 10 (*)     Urine Amorphous Few (*)     All other components within normal limits   CBC AND DIFFERENTIAL   PT AND APTT   COMPREHENSIVE METABOLIC PANEL   LIPASE   TROPONIN I   GFR       Patient improved - advised re retuurn if worse,     D/w Dr. Chilton Si - cytotec and carafate and po fluids 24 hours po -  No nausea/vomiitng in ED at discharge - will f/u surgeon for EGD      Joseph Berkshire, MD  09/04/16 2107

## 2016-09-04 NOTE — ED Triage Notes (Signed)
Constant Periumbilical pain, onset 3 hours ago. + nausea. No vomiting. Last bowel movement today. Dizzy. SOB.  No Chest pain.  No voiding issues. Gait wnl.

## 2016-09-04 NOTE — ED Notes (Signed)
Pt discharged to home. Pt A&O x 4, resp is reg and unlabored, skin is warm and dry. Discharge instructions give and information about F/U care with questions answered. Pt verbalize understanding of  Instructions. Pt ambulating to lobby with steady gait.

## 2016-09-07 ENCOUNTER — Ambulatory Visit (INDEPENDENT_AMBULATORY_CARE_PROVIDER_SITE_OTHER): Payer: Commercial Managed Care - HMO | Admitting: Internal Medicine

## 2016-09-07 ENCOUNTER — Encounter (INDEPENDENT_AMBULATORY_CARE_PROVIDER_SITE_OTHER): Payer: Self-pay | Admitting: Internal Medicine

## 2016-09-07 VITALS — BP 150/91 | HR 70 | Temp 98.3°F | Wt 141.0 lb

## 2016-09-07 DIAGNOSIS — R1010 Upper abdominal pain, unspecified: Secondary | ICD-10-CM

## 2016-09-07 DIAGNOSIS — Z9884 Bariatric surgery status: Secondary | ICD-10-CM

## 2016-09-07 DIAGNOSIS — K5900 Constipation, unspecified: Secondary | ICD-10-CM

## 2016-09-07 DIAGNOSIS — R42 Dizziness and giddiness: Secondary | ICD-10-CM

## 2016-09-07 DIAGNOSIS — I10 Essential (primary) hypertension: Secondary | ICD-10-CM

## 2016-09-07 LAB — POCT GLUCOSE: Whole Blood Glucose POCT: 7.8 mg/dL — AB (ref 70–100)

## 2016-09-07 NOTE — Progress Notes (Signed)
Subjective:       Patient ID: Amber [name] is a 65 y.o. female.    HPI  Pt c/o  1. Went to ED few days ago for diarrhea, abd pain, +nausea/vomiting. No fever, no BRBPR, no melena, no hematemesis  CT abd s/p choly, +slow transit, +mild constipation right colon, no acute process. Blood and urine tests were normal.   rx'd carafate, Cytotec,  +pain after BM, +constipation, takes miralax.   S/p gastric bypass, Has ov to see Dr. Jeanell Sparrow tomorrow  Appetite ok, +pain RUQ after eating,   2. Elevated BP, home BP this morning normal    The following portions of the patient's history were reviewed and updated as appropriate: allergies, current medications, past family history, past medical history, past social history, past surgical history and problem list.    Review of Systems   Constitutional: Negative for appetite change, fever and unexpected weight change.   HENT: Negative for sore throat and trouble swallowing.    Respiratory: Negative for shortness of breath.    Cardiovascular: Negative for chest pain.   Gastrointestinal: Positive for abdominal pain and constipation. Negative for blood in stool.   Genitourinary: Negative for dysuria.   Musculoskeletal: Negative for myalgias.   Neurological: Positive for light-headedness. Negative for syncope and headaches.     BP (!) 150/91   Pulse 70   Temp 98.3 F (36.8 C) (Oral)   Wt 64 kg (141 lb)   BMI 26.64 kg/m        Objective:    Physical Exam   Constitutional: She appears well-developed. No distress.   HENT:   Mouth/Throat: Oropharynx is clear and moist. No oropharyngeal exudate.   Eyes: Conjunctivae are normal. No scleral icterus.   Neck: Neck supple. No JVD present. Carotid bruit is not present. No thyromegaly present.   Cardiovascular: Normal rate, regular rhythm, normal heart sounds and intact distal pulses.  Exam reveals no gallop and no friction rub.    No murmur heard.  Pulmonary/Chest: Effort normal and breath sounds normal. No respiratory distress. She has  no rales.   Abdominal: Soft. Bowel sounds are normal. She exhibits no distension and no mass. There is tenderness in the right upper quadrant. There is no rebound, no guarding, no CVA tenderness and negative Murphy's sign. No hernia.   Musculoskeletal: She exhibits no edema.   Neurological: She is alert.       POCT glucose 78      Assessment:       1. Constipation, unspecified constipation type     2. Lightheadedness  POCT Glucose   3. Pain of upper abdomen     4. Essential hypertension     5. S/P gastric bypass            Plan:      Procedures    Continue with high fiber diet, hydration, otc miralax prn  F/u bariatric surgery as scheduled tomorrow  Continue meds  Monitor BP  F/u 3 months or sooner prn,

## 2016-09-08 ENCOUNTER — Ambulatory Visit (INDEPENDENT_AMBULATORY_CARE_PROVIDER_SITE_OTHER): Payer: Commercial Managed Care - HMO | Admitting: Surgery

## 2016-09-08 ENCOUNTER — Encounter (INDEPENDENT_AMBULATORY_CARE_PROVIDER_SITE_OTHER): Payer: Self-pay

## 2016-09-08 VITALS — BP 142/90 | HR 66 | Temp 97.5°F | Ht 61.0 in | Wt 140.0 lb

## 2016-09-08 DIAGNOSIS — Z9884 Bariatric surgery status: Secondary | ICD-10-CM

## 2016-09-08 DIAGNOSIS — R10816 Epigastric abdominal tenderness: Secondary | ICD-10-CM | POA: Insufficient documentation

## 2016-09-08 NOTE — Progress Notes (Signed)
ASSESSMENT:  Status post laparoscopic Roux-en-Y gastric bypass.  Epigastric abdominal pain, rule out anastomotic stricture or ulcer      PLAN:  Patient will be referred for a upper endoscopy and possible dilation if there is a anastomotic stricture.  Patient is to continue taking the Carafate.  If the upper endoscopy is negative, patient will need a small bowel follow-through to evaluate the small intestine.  The CT scan was reviewed.      SUBJECTIVE:  The patient is a 65 y.o.  female who presents status post laparoscopic Roux-en-Y gastric bypass with a 78 pound weight loss in 11 months.  Patient states that last week she started having feelings of dumping syndrome, without taking any sugars.  She was seen in the emergency room on Friday 1113, 2017.  The CT scan of the abdomen was done that did not show any obstruction but slow transit.  She is complaining of epigastric tenderness and some right upper quadrant tenderness.  She denies any fevers or chills.  Patient states that after bowel movements, she feels weak.  Her labs were within normal limits.        The following portions of the patient's history were reviewed and updated as appropriate: allergies, current medications, past family history, past medical history, past social history, past surgical history and problem list.    CURRENT PROBLEM LIST:   Patient Active Problem List   Diagnosis   . Essential hypertension   . Anxiety state, unspecified   . Abnormal electrocardiogram   . Cerebral infarction   . Gastroesophageal reflux disease   . Hypernatremia   . Paresthesia   . Slow transit constipation   . Vitamin D deficiency   . Overactive bladder   . S/P laparoscopic cholecystectomy   . Transient cerebral ischemia, unspecified type   . Palpitations   . Pituitary microadenoma   . Dyslipidemia   . Other long term (current) drug therapy   . Headache   . History of spinal surgery   . Obstructive sleep apnea syndrome   . Osteoarthritis of knees, bilateral   . Pre-op  evaluation   . Bariatric surgery status   . Nutritional deficiency   . Anxiety   . Epigastric abdominal tenderness without rebound tenderness     PAST MEDICAL HISTORY:   Past Medical History:   Diagnosis Date   . Chest pain 2016     Chest pain after eating x6  months--being followed by GI for GERD   . Constipation    . Difficulty walking     amb with cane secondary to arthritis bil knees   . Genital herpes     no current outbreak (10/03/15)   . GERD (gastroesophageal reflux disease)    . Hiatal hernia    . Hyperlipidemia    . Hypertensive disorder     Well controlled on med. Denies any SOB in last 6 months (10/03/15)   . Morbid obesity with BMI of 40.0-44.9, adult     BMI 40.9   . Nausea without vomiting    . OSA on CPAP 2015    CPAP nightly x 1 yr   . Osteoarthritis of knees, bilateral    . TIA (transient ischemic attack) 2014    TIA 2014-no residual. Patient evaulated for possible TIA 07/12/2015  @ [facility] (dischage summary in epic)-per summary CT of head was normal and patient was d/c'd     PAST SURGICAL HISTORY:   Past Surgical History:   Procedure Laterality Date   .  APPENDECTOMY  >25 yrs   . EGD  01/2015   . fallopian tube surgery  >25 yrs   . INSERTION, PAIN PUMP (MEDICAL) N/A 10/22/2015    Procedure: INSERTION, PAIN PUMP (MEDICAL);  Surgeon: Nicola Police, DO;  Location: [facility] OR;  Service: General;  Laterality: N/A;   . LAPAROSCOPIC, CHOLECYSTECTOMY, CHOLANGIOGRAM  08/13/2014   . LAPAROSCOPIC, GASTRIC BYPASS N/A 10/22/2015    Procedure: LAPAROSCOPIC, GASTRIC BYPASS;  Surgeon: Josefa Half R, DO;  Location: [facility];  Service: General;  Laterality: N/A;   . LAPAROSCOPIC, HERNIORRHAPHY, HIATAL N/A 10/22/2015    Procedure: LAPAROSCOPIC, HERNIORRHAPHY, HIATAL;  Surgeon: Jeanell Sparrow, Chanie Soucek R, DO;  Location: [facility] OR;  Service: General;  Laterality: N/A;   . OVARY SURGERY Right >25 yrs   . SPINE SURGERY  11/2013    cervical HNP x 2, Dr. Bufford Buttner     TOBACCO HISTORY:   History   Smoking  Status   . Never Smoker   Smokeless Tobacco   . Never Used     ALCOHOL HISTORY:   History   Alcohol Use No     DRUG HISTORY:   History   Drug Use No     CURRENT OUTPATIENT MEDICATIONS:   Outpatient Prescriptions Marked as Taking for the 09/08/16 encounter (Office Visit) with Tere Mcconaughey R, DO   Medication Sig Dispense Refill   . albuterol (PROVENTIL HFA;VENTOLIN HFA) 108 (90 BASE) MCG/ACT inhaler Inhale into the lungs every 4 (four) hours as needed.        Marland Kitchen aspirin 81 MG chewable tablet Take 81 mg by Mouth Once a Day. Indications: STOPPED     . atenolol (TENORMIN) 50 MG tablet TAKE 1 and 1/2 TABLET BY MOUTH DAILY. (Patient taking differently: 75 mg.TAKE 1 and 1/2 TABLET BY MOUTH DAILY.    ) 140 tablet 1   . desonide (DESOWEN) 0.05 % cream Apply topically 2 (two) times daily as needed. 60 g 2   . docusate sodium (COLACE) 100 MG capsule Take 100 mg by mouth daily.         . fluticasone (FLONASE) 50 MCG/ACT nasal spray 2 sprays by Nasal route daily. (Patient taking differently: 2 sprays by Nasal route as needed.   ) 16 g 2   . lidocaine (XYLOCAINE) 2 % jelly Apply topically as needed. 30 mL 1   . LORazepam (ATIVAN) 0.5 MG tablet Take 1 tablet (0.5 mg total) by mouth every 8 (eight) hours as needed for Anxiety. 30 tablet 1   . miSOPROStol (CYTOTEC) 200 MCG tablet Take 1 tablet (200 mcg total) by mouth 4 (four) times daily.May repeat if needed in 24 hours (for missed or incomplete miscarriage) 30 tablet 0   . pantoprazole (PROTONIX) 40 MG tablet Take 40 mg by mouth daily.     . polyethylene glycol (MIRALAX) packet Take 17 g by mouth daily as needed. (Patient taking differently: 17 g every other day.    ) 5 packet 0   . sucralfate (CARAFATE) 1 g tablet Take 1 tablet (1 g total) by mouth 4 (four) times daily. 30 tablet 0     ALLERGIES:   Allergies   Allergen Reactions   . Acyclovir Nausea And Vomiting   . Amoxicillin-Pot Clavulanate      Other reaction(s): gi distress   . Aspirin Nausea And Vomiting     Other  reaction(s): gi distress  Upsets stomach  Able to tolerate enteric coated aspirin   . Atorvastatin  Palpitation   . Lovastatin Nausea And Vomiting   . Metronidazole Nausea And Vomiting   . Moxifloxacin Nausea And Vomiting     GI symptoms   . Other Nausea And Vomiting   . Rosuvastatin      Palpitation, chest pain, abd pain    . Statins    . Sulfa Antibiotics Nausea And Vomiting     Other reaction(s): gi distress       OBJECTIVE:  BP 142/90   Pulse 66   Temp 97.5 F (36.4 C)   Ht 5\' 1"    Wt 140 lb   BMI 26.45 kg/m     General appearance: alert, appears stated age and cooperative  Head: Normocephalic, without obvious abnormality, atraumatic  Eyes: negative findings: conjunctivae and sclerae normal  Abdomen: Positive epigastric tenderness with guarding.  Abdomen soft, nondistended, positive bowel sounds.  There is no peritoneal signs.      US Abdomen Complete    Result Date: 08/09/2016  CLINICAL INDICATION: Right upper quadrant pain status post cholecystectomy. TECHNIQUE:  Grayscale and color assessment of the abdomen was performed by the ultrasound technologist.  The saved images were reviewed by the radiologist. FINDINGS: Liver: Within normal limits. No intrahepatic biliary dilatation is appreciated. Spleen: Within normal limits. Gallbladder: Absent. Common bile duct: Normal in caliber measuring 5 mm. Pancreas: Limited assessment; however, visualized portions are unremarkable. Kidneys: No evidence of hydronephrosis.  No solid renal lesions are appreciated. Simple cyst within the lower pole of the right kidney measures 2.1 x 1.9 x 2.0 cm. Size within normal limits.   Misc: The visualized IVC and aorta are unremarkable.         No evidence of acute intra-abdominal process. Gustavus Messing, MD 08/09/2016 11:10 AM     Chest Ap Portable    Result Date: 09/04/2016  INDICATION: Shortness of breath COMPARISON: 11/02/2013 FINDINGS:  A single radiograph of the chest performed. Portable film. The heart is normal in size. The  mediastinal and hilar structures are within normal limits. The lung fields are clear. No pleural effusion. No pneumothorax.  The  visualized osseous structures demonstrates no acute abnormality. Plate and screws in the lower cervical spine.      No active disease Kinnie Feil, MD 09/04/2016 4:12 PM     Ct Abd/pelvis With Iv And Po Contrast    Result Date: 09/04/2016  ABDOMINAL AND PELVIC CT WITH CONTRAST CLINICAL INDICATION: 65 year old female with abdominal pain. History of gastric bypass. COMPARISON: Abdominal pelvic CT 07/24/2016, 01/02/2016, 05/15/2012 TECHNIQUE: Helical CT scan through the abdomen and pelvis was obtained from the dome of the diaphragm to the symphysis pubis after oral and  IV contrast. Coronal and sagittal reformats were provided. Note that CT scanning at this site utilized multiple dose reduction techniques including automatic exposure control, adjustment of the MAS and or KVP according to patient's size and use of iterative reconstruction technique. IV contrast: 100 cc of  Omnipaque 350 FINDINGS: Lung bases:  Within normal limits. Soft tissue: [Within normal limits. Bones: No acute bony process. Liver: Within normal limits with stable 1 cm simple cyst in the right upper lobe. Spleen: Within normal limits. Pancreas: Within normal limits. Gallbladder: Cholecystectomy changes. Adrenal glands: Within normal limits. Kidneys/Ureters/Bladder: Stable multiple renal cysts with the largest measuring up to 2 cm in the inferior right kidney. Otherwise unremarkable kidneys and ureters. The bladder is prominent without mural thickening or mass. Stomach/bowel: Stable gastric bypass. Oral contrast is seen within the gastric pouch and mid  small bowel. No evidence of obstruction or significant inflammation. A slight prominent loop of small bowel in the left lower quadrant measuring up to 2 cm (axial 48) contains heterogeneous material suggesting slow transit. Colon is is unremarkable with mild right colonic  constipation. Appendix: Not discretely visualized without secondary signs of appendicitis. Retroperitoneum and peritoneum: No bulky adenopathy. No free fluid or free air. Reproductive organs: Within normal limits. Vascular structures: Within normal limits.      No acute process. Findings likely represent slow small bowel transit. No evidence of bowel obstruction. Stable gastric bypass surgical changes Prominent bladder could be secondary to urinary retention. Max Fickle, MD 09/04/2016 8:02 PM         Kelsye Loomer R. Takeysha Bonk, DO, FACS, FASMBS, FACOS

## 2016-09-09 ENCOUNTER — Ambulatory Visit (INDEPENDENT_AMBULATORY_CARE_PROVIDER_SITE_OTHER): Payer: Commercial Managed Care - HMO | Admitting: Surgery

## 2016-09-09 LAB — ECG 12-LEAD
Atrial Rate: 69 {beats}/min
P Axis: 47 degrees
P-R Interval: 152 ms
Q-T Interval: 388 ms
QRS Duration: 80 ms
QTC Calculation (Bezet): 415 ms
R Axis: 4 degrees
T Axis: 34 degrees
Ventricular Rate: 69 {beats}/min

## 2016-09-14 ENCOUNTER — Encounter (INDEPENDENT_AMBULATORY_CARE_PROVIDER_SITE_OTHER): Payer: Self-pay | Admitting: Internal Medicine

## 2016-09-14 ENCOUNTER — Encounter (INDEPENDENT_AMBULATORY_CARE_PROVIDER_SITE_OTHER): Payer: Self-pay | Admitting: Surgery

## 2016-09-22 ENCOUNTER — Encounter (INDEPENDENT_AMBULATORY_CARE_PROVIDER_SITE_OTHER): Payer: Self-pay | Admitting: Internal Medicine

## 2016-09-22 ENCOUNTER — Encounter (INDEPENDENT_AMBULATORY_CARE_PROVIDER_SITE_OTHER): Payer: Self-pay | Admitting: Surgery

## 2016-09-23 ENCOUNTER — Other Ambulatory Visit (INDEPENDENT_AMBULATORY_CARE_PROVIDER_SITE_OTHER): Payer: Self-pay | Admitting: Surgery

## 2016-09-23 DIAGNOSIS — R10816 Epigastric abdominal tenderness: Secondary | ICD-10-CM

## 2016-09-23 DIAGNOSIS — Z9884 Bariatric surgery status: Secondary | ICD-10-CM

## 2016-09-25 ENCOUNTER — Encounter (INDEPENDENT_AMBULATORY_CARE_PROVIDER_SITE_OTHER): Payer: Self-pay | Admitting: Family

## 2016-09-25 ENCOUNTER — Ambulatory Visit (INDEPENDENT_AMBULATORY_CARE_PROVIDER_SITE_OTHER): Payer: Commercial Managed Care - HMO | Admitting: Family

## 2016-09-25 VITALS — BP 124/81 | HR 67 | Temp 98.0°F | Ht 61.0 in | Wt 137.0 lb

## 2016-09-25 DIAGNOSIS — J320 Chronic maxillary sinusitis: Secondary | ICD-10-CM

## 2016-09-25 MED ORDER — AZITHROMYCIN 250 MG PO TABS
ORAL_TABLET | ORAL | 1 refills | Status: AC
Start: 2016-09-25 — End: 2016-09-30

## 2016-09-25 NOTE — Progress Notes (Signed)
Have you sought care outside of [place] since we last saw you?   No

## 2016-09-25 NOTE — Progress Notes (Signed)
[facility] PRIMARY CARE  [facility]  PROGRESS NOTE      Patient: Amber [name]   Date: 09/25/2016   MRN: [id number]   DOB: 1951-01-27     ASSESSMENT/PLAN     Amber [name] is a 65 y.o. female with:    1. Left maxillary sinusitis  - azithromycin (ZITHROMAX Z-PAK) 250 MG tablet; Take 2 tablets (500 mg) on  Day 1,  followed by 1 tablet (250 mg) once daily on Days 2 through 5.  Dispense: 6 tablet; Refill: 1  -PE stable in clinic, > 14 days of s/sx, worsening. Educated about diagnosis, treatment options, and expected outcomes. Started on Z-pak, with refill if s/sx persist d/t Augmentin allergy. Advised to continue Flonase  nasal spray for symptom tx daily at this time. Follow up if s/sx persist or worsen. Patient verbalized understanding of instructions.=  Pt agreed and verbalized understanding of plan.     MEDICATIONS     Current Outpatient Prescriptions   Medication Sig Dispense Refill   . albuterol (PROVENTIL HFA;VENTOLIN HFA) 108 (90 BASE) MCG/ACT inhaler Inhale into the lungs every 4 (four) hours as needed.        Marland Kitchen aspirin 81 MG chewable tablet Take 81 mg by Mouth Once a Day. Indications: STOPPED     . atenolol (TENORMIN) 50 MG tablet TAKE 1 and 1/2 TABLET BY MOUTH DAILY. (Patient taking differently: 75 mg.TAKE 1 and 1/2 TABLET BY MOUTH DAILY.    ) 140 tablet 1   . desonide (DESOWEN) 0.05 % cream Apply topically 2 (two) times daily as needed. 60 g 2   . docusate sodium (COLACE) 100 MG capsule Take 100 mg by mouth daily.         . fluticasone (FLONASE) 50 MCG/ACT nasal spray 2 sprays by Nasal route daily. (Patient taking differently: 2 sprays by Nasal route as needed.   ) 16 g 2   . lidocaine (XYLOCAINE) 2 % jelly Apply topically as needed. 30 mL 1   . LORazepam (ATIVAN) 0.5 MG tablet Take 1 tablet (0.5 mg total) by mouth every 8 (eight) hours as needed for Anxiety. 30 tablet 1   . miSOPROStol (CYTOTEC) 200 MCG tablet Take 1 tablet (200 mcg total) by mouth 4 (four) times daily.May repeat if needed in 24 hours (for  missed or incomplete miscarriage) 30 tablet 0   . pantoprazole (PROTONIX) 40 MG tablet Take 40 mg by mouth daily.     . polyethylene glycol (MIRALAX) packet Take 17 g by mouth daily as needed. (Patient taking differently: 17 g every other day.    ) 5 packet 0   . sucralfate (CARAFATE) 1 g tablet Take 1 tablet (1 g total) by mouth 4 (four) times daily. 30 tablet 0   . valACYclovir HCL (VALTREX) 500 MG tablet Take 1 tablet (500 mg total) by mouth daily. 90 tablet 1     No current facility-administered medications for this visit.        Allergies   Allergen Reactions   . Acyclovir Nausea And Vomiting   . Amoxicillin-Pot Clavulanate      Other reaction(s): gi distress   . Aspirin Nausea And Vomiting     Other reaction(s): gi distress  Upsets stomach  Able to tolerate enteric coated aspirin   . Atorvastatin      Palpitation   . Lovastatin Nausea And Vomiting   . Metronidazole Nausea And Vomiting   . Moxifloxacin Nausea And Vomiting     GI symptoms   .  Other Nausea And Vomiting   . Rosuvastatin      Palpitation, chest pain, abd pain    . Statins    . Sulfa Antibiotics Nausea And Vomiting     Other reaction(s): gi distress       SUBJECTIVE     Chief Complaint   Patient presents with   . Sinusitis        HPI    Amber [name] is a 65 y.o. female reports to clinic for concern of + purulent and foul smelling phlegm and congestion,  nausea, left sinus pressure and pain, voice, + cough, + PND, + malaise.  Worse in the am. No fevers. S/sx approx 2-3 weeks. Tx tried: Flonase with no sig improvement. Had left sinus ballon plasty in 2016.Since surgery, has only one sinus infection.     Past Medical History:   Diagnosis Date   . Chest pain 2016     Chest pain after eating x6  months--being followed by GI for GERD   . Constipation    . Difficulty walking     amb with cane secondary to arthritis bil knees   . Genital herpes     no current outbreak (10/03/15)   . GERD (gastroesophageal reflux disease)    . Hiatal hernia    .  Hyperlipidemia    . Hypertensive disorder     Well controlled on med. Denies any SOB in last 6 months (10/03/15)   . Morbid obesity with BMI of 40.0-44.9, adult     BMI 40.9   . Nausea without vomiting    . OSA on CPAP 2015    CPAP nightly x 1 yr   . Osteoarthritis of knees, bilateral    . TIA (transient ischemic attack) 2014    TIA 2014-no residual. Patient evaulated for possible TIA 07/12/2015  @ [facility] (dischage summary in epic)-per summary CT of head was normal and patient was d/c'd       Social History     Social History   . Marital status: Single     Spouse name: N/A   . Number of children: 2   . Years of education: N/A     Occupational History   . legal instrument examiner [employer]      Social History Main Topics   . Smoking status: Never Smoker   . Smokeless tobacco: Never Used   . Alcohol use No   . Drug use: No   . Sexual activity: No     Other Topics Concern   . Dietary Supplements / Vitamins Yes   . Anesthesia Problems No   . Blood Thinners No   . Eats Large Amounts No   . Excessive Sweets No   . Skips Meals No   . Eats Excessive Starches Yes   . Snacks Or Grazes No   . Emotional Eater Yes   . Eats Fried Food Yes   . Eats Fast Food Yes   . Diet Center No   . Hmr No   . [name] No   . La Weight Loss No   . Nutri-System No   . Opti-Fast / Medi-Fast No   . Overeaters Anonymous No   . [facility] Weight Loss Center No   . Tops No   . Weight Watchers No   . Atkins No   . Binging / Purging No   . Body For Life No   . Cabbage Soup No   . Calorie Counting  No   . Fasting No   . [name] No   . Health Spa No   . Herbal Life No   . High Protein No   . Low Carb No   . Low Fat No   . [facility] Diet No   . [place] Diet No   . Margie Billet Diet No   . [place] Diet No   . Slim Fast No   . [place] No   . Sugar Busters No   . Vomiting No   . Zone Diet No   . Stationary Cycle Or Treadmill No   . Gym/Fitness Classes No   . Home Exercise/Video No   . Swimming No   . Team Sports No   . Weight  Training No   . Walking Or Running No   . Hospitalization No   . Hypnosis No   . Physical Therapy No   . Psychological Therapy No   . Residential Program No   . Acutrim No   . Amphetamines No   . Anorex No   . Byetta No   . Dexatrim No   . Didrex No   . Fastin No   . Fen - Phen No   . Ionamin / Adipex No   . Mazanor No   . Meridia No   . Obalan No   . Phendiet No   . Phentrol No   . Phenteramine Yes   . Plegine No   . Pondimin No   . Qsymia No   . Prozac No   . Redux No   . Sanorex No   . Tenuate No   . Tepanole No   . Wechless No   . Wellbutrin No   . Xenical (Orlistat, Alli) No   . Other Med No   . No Impairment Yes     Chronic Bilat Knee Pain   . Walks With Cane/Crutch Yes   . Requires A Wheelchair No   . Bedridden No   . Are You Currently Being Treated For Depression? No   . Do You Snore? Yes   . Are You Receiving Any Medical Or Psychological Services? No   . Do You Have Or Have You Been Treated For An Eating Disorder? No   . Do You Exercise Regularly? No   . Have You Or Family Member Ever Have Trouble With Anesthesia? No     Social History Narrative   . No narrative on file       Past Surgical History:   Procedure Laterality Date   . APPENDECTOMY  >25 yrs   . EGD  01/2015   . fallopian tube surgery  >25 yrs   . INSERTION, PAIN PUMP (MEDICAL) N/A 10/22/2015    Procedure: INSERTION, PAIN PUMP (MEDICAL);  Surgeon: Nicola Police, DO;  Location: [place] MA[facility] OR;  Service: General;  Laterality: N/A;   . LAPAROSCOPIC, CHOLECYSTECTOMY, CHOLANGIOGRAM  08/13/2014   . LAPAROSCOPIC, GASTRIC BYPASS N/A 10/22/2015    Procedure: LAPAROSCOPIC, GASTRIC BYPASS;  Surgeon: Josefa Half R, DO;  Location: [place] [facility] OR;  Service: General;  Laterality: N/A;   . LAPAROSCOPIC, HERNIORRHAPHY, HIATAL N/A 10/22/2015    Procedure: LAPAROSCOPIC, HERNIORRHAPHY, HIATAL;  Surgeon: Jeanell Sparrow, Hamid R, DO;  Location: [place] [facility] OR;  Service: General;  Laterality: N/A;   . OVARY SURGERY Right >25 yrs   . SPINE SURGERY  11/2013     cervical HNP x 2, Dr. Bufford Buttner  Family History   Problem Relation Age of Onset   . Cancer Mother 12     breast cancer         ROS     Review of Systems   Constitutional: Negative for chills, fatigue and fever.   HENT: Positive for congestion, postnasal drip, rhinorrhea, sinus pain, sinus pressure and voice change.    Respiratory: Positive for apnea. Negative for cough, shortness of breath and wheezing.    Cardiovascular: Negative for chest pain and palpitations.   Gastrointestinal: Positive for nausea. Negative for abdominal pain, constipation, diarrhea and vomiting.     The following portions of the patient's history were reviewed and updated as appropriate: Allergies, Current Medications, Past Family History, Past Medical history, Past social history, Past surgical history, and Problem List.    OBJECTIVE/PHYSICAL EXAM     Vitals:    09/25/16 1314   BP: 124/81   Pulse: 67   Temp: 98 F (36.7 C)   TempSrc: Oral   Weight: 62.1 kg (137 lb)   Height: 1.549 m (5\' 1" )     Body mass index is 25.89 kg/m.    Physical Exam   Constitutional: She appears well-developed and well-nourished.   HENT:   Head: Normocephalic.   Right Ear: Tympanic membrane, external ear and ear canal normal.   Left Ear: External ear normal. Tympanic membrane is bulging.   Nose: Mucosal edema and rhinorrhea present. Right sinus exhibits no maxillary sinus tenderness and no frontal sinus tenderness. Left sinus exhibits maxillary sinus tenderness. Left sinus exhibits no frontal sinus tenderness.   Mouth/Throat: Uvula is midline, oropharynx is clear and moist and mucous membranes are normal.   Cardiovascular: Normal rate, regular rhythm and normal heart sounds.    Pulmonary/Chest: Effort normal and breath sounds normal. No respiratory distress.   Skin: Skin is warm and dry.   Psychiatric: She has a normal mood and affect. Her behavior is normal.   Nursing note and vitals reviewed.      Signed,  Dory Larsen, FNP-C  09/25/2016

## 2016-10-09 ENCOUNTER — Encounter (INDEPENDENT_AMBULATORY_CARE_PROVIDER_SITE_OTHER): Payer: Self-pay | Admitting: Internal Medicine

## 2016-10-12 ENCOUNTER — Encounter (INDEPENDENT_AMBULATORY_CARE_PROVIDER_SITE_OTHER): Payer: Self-pay | Admitting: Internal Medicine

## 2016-10-13 ENCOUNTER — Encounter (INDEPENDENT_AMBULATORY_CARE_PROVIDER_SITE_OTHER): Payer: Self-pay | Admitting: Cardiology

## 2016-10-13 ENCOUNTER — Ambulatory Visit (INDEPENDENT_AMBULATORY_CARE_PROVIDER_SITE_OTHER): Payer: Commercial Managed Care - HMO | Admitting: Cardiology

## 2016-10-13 VITALS — BP 136/75 | HR 57 | Resp 16 | Ht 61.0 in | Wt 133.0 lb

## 2016-10-13 DIAGNOSIS — R0789 Other chest pain: Secondary | ICD-10-CM | POA: Insufficient documentation

## 2016-10-13 DIAGNOSIS — E785 Hyperlipidemia, unspecified: Secondary | ICD-10-CM

## 2016-10-13 DIAGNOSIS — R109 Unspecified abdominal pain: Secondary | ICD-10-CM

## 2016-10-13 NOTE — Progress Notes (Signed)
Amber [name] 65 y.o. with a history of HTN presents for cardiac follow up post hospitalization    She was admitted for chest pain and abdominal pain   - troponin peaked at 0.04   - echo 50%   - chest pain has resovled   - cardiac cath 2015 - normal coronary arteries   - scheduled for barium swallow this week    Cardiac work up has consisted of              - cardiac cath ( 01/08/14 - normal )              - echocardiogram ( EF 55%, mild TR)      Past Medical History:   Diagnosis Date   . Chest pain 2016     Chest pain after eating x6  months--being followed by GI for GERD   . Constipation    . Difficulty walking     amb with cane secondary to arthritis bil knees   . Genital herpes     no current outbreak (10/03/15)   . GERD (gastroesophageal reflux disease)    . Hiatal hernia    . Hyperlipidemia    . Hypertensive disorder     Well controlled on med. Denies any SOB in last 6 months (10/03/15)   . Morbid obesity with BMI of 40.0-44.9, adult     BMI 40.9   . Nausea without vomiting    . OSA on CPAP 2015    CPAP nightly x 1 yr   . Osteoarthritis of knees, bilateral    . TIA (transient ischemic attack) 2014    TIA 2014-no residual. Patient evaulated for possible TIA 07/12/2015  @ [facility] (dischage summary in epic)-per summary CT of head was normal and patient was d/c'd     Family History   Problem Relation Age of Onset   . Cancer Mother 77     breast cancer     Current Outpatient Prescriptions   Medication Sig Dispense Refill   . albuterol (PROVENTIL HFA;VENTOLIN HFA) 108 (90 BASE) MCG/ACT inhaler Inhale into the lungs every 4 (four) hours as needed.        Marland Kitchen aspirin 81 MG chewable tablet Take 81 mg by Mouth Once a Day. Indications: STOPPED     . atenolol (TENORMIN) 50 MG tablet TAKE 1 and 1/2 TABLET BY MOUTH DAILY. (Patient taking differently: 75 mg.TAKE 1 and 1/2 TABLET BY MOUTH DAILY.    ) 140 tablet 1   . atenolol (TENORMIN) 50 MG tablet Take 75 mg by Mouth Once a Day.     . B Complex Vitamins (VITAMIN-B COMPLEX) Tab  Take  by Mouth Once a Day.     . cholecalciferol (VITAMIN D-1000 MAX ST) 1000 units tablet Take 1,000 Units by Mouth Once a Day.     . desonide (DESOWEN) 0.05 % cream Apply topically 2 (two) times daily as needed. 60 g 2   . docusate sodium (COLACE) 100 MG capsule Take 100 mg by mouth daily.         . fluticasone (FLONASE) 50 MCG/ACT nasal spray 2 sprays by Nasal route daily. (Patient taking differently: 2 sprays by Nasal route as needed.   ) 16 g 2   . lidocaine (XYLOCAINE) 2 % jelly Apply topically as needed. 30 mL 1   . LORazepam (ATIVAN) 0.5 MG tablet Take 1 tablet (0.5 mg total) by mouth every 8 (eight) hours as needed for Anxiety. 30 tablet 1   .  pantoprazole (PROTONIX) 40 MG tablet Take 40 mg by mouth daily.     . polyethylene glycol (MIRALAX) packet Take 17 g by mouth daily as needed. (Patient taking differently: 17 g every other day.    ) 5 packet 0   . valACYclovir HCL (VALTREX) 500 MG tablet Take 1 tablet (500 mg total) by mouth daily. 90 tablet 1   . vitamin A 16109 UNIT capsule Take 10,000 Units by Mouth Once a Day.       No current facility-administered medications for this visit.         Review of Systems:    Constitutional: Negative for fevers and chills  Skin: No rash or lesions  Respiratory: Negative for cough, wheezing, or hemoptysis  Cardiovascular: as per HPI  Gastrointestinal: Negative for abdominal pain, nausea, vomiting and diarrhea  Musculoskeletal:  No arthritic symptoms  Genitourinary: Negative for dysuria  Otherwise 10 point review of systems is negative.    PE:    Vitals:    10/13/16 1258   BP: 136/75   Pulse: (!) 57   Resp: 16     Body mass index is 25.13 kg/m.    General: nad  cv rr  Lung cta  abd s  Ext no edema    EKG:    sb 57 bpm, nstwa    Labs:  Lipid Panel   Cholesterol   Date/Time Value Ref Range Status   04/09/2016 07:20 AM 168 125 - 200 mg/dL Final     Triglycerides   Date/Time Value Ref Range Status   04/09/2016 07:20 AM 68 <150 mg/dL Final     Comment:     Fasting reference  interval  Analysis performed on aliquotted specimen. CO2 may be decreased  due to greater exposure of specimen to air.  For patients >10 years of age, the reference limit  for Creatinine is approximately 13% higher for people  identified as African-American.     09/25/2015 11:15 AM 63 34 - 149 mg/dL Final     HDL   Date/Time Value Ref Range Status   04/09/2016 07:20 AM 61 > or = 46 mg/dL Final       CMP:   Sodium   Date/Time Value Ref Range Status   09/04/2016 04:05 PM 142 136 - 145 mEq/L Final     Potassium   Date/Time Value Ref Range Status   09/04/2016 04:05 PM 3.5 3.5 - 5.1 mEq/L Final     Chloride   Date/Time Value Ref Range Status   09/04/2016 04:05 PM 107 100 - 111 mEq/L Final   04/09/2016 07:20 AM 105 98 - 110 mmol/L Final     CO2   Date/Time Value Ref Range Status   09/04/2016 04:05 PM 24 22 - 29 mEq/L Final     Glucose   Date/Time Value Ref Range Status   09/04/2016 04:05 PM 95 70 - 100 mg/dL Final     Comment:     ADA guidelines for diabetes mellitus:  Fasting:  Equal to or greater than 126 mg/dL  Random:   Equal to or greater than 200 mg/dL       BUN   Date/Time Value Ref Range Status   09/04/2016 04:05 PM 12.0 7.0 - 19.0 mg/dL Final     Protein, Total   Date/Time Value Ref Range Status   09/04/2016 04:05 PM 6.6 6.0 - 8.3 g/dL Final   60/45/4098 11:91 AM 6.8 6.1 - 8.1 G/DL Final     Alkaline Phosphatase  Date/Time Value Ref Range Status   09/04/2016 04:05 PM 102 37 - 106 U/L Final     AST (SGOT)   Date/Time Value Ref Range Status   09/04/2016 04:05 PM 18 5 - 34 U/L Final     ALT   Date/Time Value Ref Range Status   09/04/2016 04:05 PM 16 0 - 55 U/L Final     Anion Gap   Date/Time Value Ref Range Status   09/04/2016 04:05 PM 11.0 5.0 - 15.0 Final       CBC:   WBC   Date/Time Value Ref Range Status   09/04/2016 04:05 PM 7.07 3.50 - 10.80 x10 3/uL Final   06/30/2009 04:03 PM 9.12 3.50 - 10.80 /CUMM Final     RBC   Date/Time Value Ref Range Status   09/04/2016 04:05 PM 4.62 4.20 - 5.40 x10 6/uL Final      Hemoglobin   Date/Time Value Ref Range Status   04/09/2016 07:20 AM 13.4 11.7 - 15.5 g/dL Final     Hgb   Date/Time Value Ref Range Status   09/04/2016 04:05 PM 13.0 12.0 - 16.0 g/dL Final     Hematocrit   Date/Time Value Ref Range Status   09/04/2016 04:05 PM 38.8 37.0 - 47.0 % Final     MCV   Date/Time Value Ref Range Status   09/04/2016 04:05 PM 84.0 80.0 - 100.0 fL Final     MCHC   Date/Time Value Ref Range Status   09/04/2016 04:05 PM 33.5 32.0 - 36.0 g/dL Final     RDW   Date/Time Value Ref Range Status   09/04/2016 04:05 PM 15 12 - 15 % Final     Platelets   Date/Time Value Ref Range Status   09/04/2016 04:05 PM 280 140 - 400 x10 3/uL Final   04/09/2016 07:20 AM 263 140 - 400 Thousand/uL Final           Impression / plan    Chest pain   - resolved   - noncardiac  Abdominal pain   - will follow up with GI  HTN   - controlled    rtc 1 year          Villa Burgin Dan Humphreys  10/13/2016

## 2016-10-15 ENCOUNTER — Other Ambulatory Visit (INDEPENDENT_AMBULATORY_CARE_PROVIDER_SITE_OTHER): Payer: Self-pay

## 2016-10-15 ENCOUNTER — Other Ambulatory Visit (INDEPENDENT_AMBULATORY_CARE_PROVIDER_SITE_OTHER): Payer: Self-pay | Admitting: Internal Medicine

## 2016-10-15 DIAGNOSIS — R10816 Epigastric abdominal tenderness: Secondary | ICD-10-CM

## 2016-10-15 DIAGNOSIS — Z9884 Bariatric surgery status: Secondary | ICD-10-CM

## 2016-10-15 DIAGNOSIS — A6000 Herpesviral infection of urogenital system, unspecified: Secondary | ICD-10-CM

## 2016-10-16 ENCOUNTER — Other Ambulatory Visit (INDEPENDENT_AMBULATORY_CARE_PROVIDER_SITE_OTHER): Payer: Self-pay

## 2016-10-19 ENCOUNTER — Other Ambulatory Visit (INDEPENDENT_AMBULATORY_CARE_PROVIDER_SITE_OTHER): Payer: Self-pay

## 2016-10-30 ENCOUNTER — Encounter (INDEPENDENT_AMBULATORY_CARE_PROVIDER_SITE_OTHER): Payer: Self-pay | Admitting: Internal Medicine

## 2016-10-30 ENCOUNTER — Other Ambulatory Visit (INDEPENDENT_AMBULATORY_CARE_PROVIDER_SITE_OTHER): Payer: Self-pay | Admitting: Internal Medicine

## 2016-10-30 MED ORDER — DIAZEPAM 2 MG PO TABS
2.0000 mg | ORAL_TABLET | Freq: Three times a day (TID) | ORAL | 2 refills | Status: DC | PRN
Start: 2016-10-30 — End: 2016-11-03

## 2016-11-01 ENCOUNTER — Emergency Department: Payer: Commercial Managed Care - HMO

## 2016-11-01 ENCOUNTER — Emergency Department
Admission: EM | Admit: 2016-11-01 | Discharge: 2016-11-01 | Disposition: A | Discharge status: Home / Self Care | Payer: Commercial Managed Care - HMO | Attending: Emergency Medicine | Admitting: Emergency Medicine

## 2016-11-01 DIAGNOSIS — Z79899 Other long term (current) drug therapy: Secondary | ICD-10-CM | POA: Insufficient documentation

## 2016-11-01 DIAGNOSIS — Z7982 Long term (current) use of aspirin: Secondary | ICD-10-CM | POA: Insufficient documentation

## 2016-11-01 DIAGNOSIS — Z8673 Personal history of transient ischemic attack (TIA), and cerebral infarction without residual deficits: Secondary | ICD-10-CM | POA: Insufficient documentation

## 2016-11-01 DIAGNOSIS — R55 Syncope and collapse: Secondary | ICD-10-CM | POA: Insufficient documentation

## 2016-11-01 DIAGNOSIS — K219 Gastro-esophageal reflux disease without esophagitis: Secondary | ICD-10-CM | POA: Insufficient documentation

## 2016-11-01 DIAGNOSIS — G4733 Obstructive sleep apnea (adult) (pediatric): Secondary | ICD-10-CM | POA: Insufficient documentation

## 2016-11-01 DIAGNOSIS — I10 Essential (primary) hypertension: Secondary | ICD-10-CM | POA: Insufficient documentation

## 2016-11-01 DIAGNOSIS — R079 Chest pain, unspecified: Secondary | ICD-10-CM | POA: Insufficient documentation

## 2016-11-01 DIAGNOSIS — E785 Hyperlipidemia, unspecified: Secondary | ICD-10-CM | POA: Insufficient documentation

## 2016-11-01 LAB — CBC AND DIFFERENTIAL
Absolute NRBC: 0 10*3/uL
Basophils Absolute Automated: 0.03 10*3/uL (ref 0.00–0.20)
Basophils Automated: 0.6 %
Eosinophils Absolute Automated: 0.04 10*3/uL (ref 0.00–0.70)
Eosinophils Automated: 0.8 %
Hematocrit: 42.9 % (ref 37.0–47.0)
Hgb: 14.1 g/dL (ref 12.0–16.0)
Immature Granulocytes Absolute: 0.02 10*3/uL
Immature Granulocytes: 0.4 %
Lymphocytes Absolute Automated: 1.37 10*3/uL (ref 0.50–4.40)
Lymphocytes Automated: 27 %
MCH: 28.5 pg (ref 28.0–32.0)
MCHC: 32.9 g/dL (ref 32.0–36.0)
MCV: 86.8 fL (ref 80.0–100.0)
MPV: 10.1 fL (ref 9.4–12.3)
Monocytes Absolute Automated: 0.41 10*3/uL (ref 0.00–1.20)
Monocytes: 8.1 %
Neutrophils Absolute: 3.21 10*3/uL (ref 1.80–8.10)
Neutrophils: 63.1 %
Nucleated RBC: 0 /100 WBC (ref 0.0–1.0)
Platelets: 234 10*3/uL (ref 140–400)
RBC: 4.94 10*6/uL (ref 4.20–5.40)
RDW: 15 % (ref 12–15)
WBC: 5.08 10*3/uL (ref 3.50–10.80)

## 2016-11-01 LAB — ECG 12-LEAD
Atrial Rate: 65 {beats}/min
P Axis: 47 degrees
P-R Interval: 168 ms
Q-T Interval: 392 ms
QRS Duration: 76 ms
QTC Calculation (Bezet): 407 ms
R Axis: -5 degrees
T Axis: 23 degrees
Ventricular Rate: 65 {beats}/min

## 2016-11-01 LAB — BASIC METABOLIC PANEL
Anion Gap: 14 (ref 5.0–15.0)
BUN: 10 mg/dL (ref 7.0–19.0)
CO2: 25 mEq/L (ref 22–29)
Calcium: 10.5 mg/dL (ref 8.5–10.5)
Chloride: 103 mEq/L (ref 100–111)
Creatinine: 0.7 mg/dL (ref 0.6–1.0)
Glucose: 80 mg/dL (ref 70–100)
Potassium: 3.9 mEq/L (ref 3.5–5.1)
Sodium: 142 mEq/L (ref 136–145)

## 2016-11-01 LAB — I-STAT LACTIC ACID: Lactic Acid I-Stat: 0.9 mmol/L (ref 0.2–2.0)

## 2016-11-01 LAB — GFR: EGFR: >60

## 2016-11-01 LAB — IHS D-DIMER: D-Dimer: 0.47 ug/mL FEU (ref 0.00–0.51)

## 2016-11-01 LAB — TROPONIN I: Troponin I: <0.01 ng/mL (ref 0.00–0.09)

## 2016-11-01 MED ORDER — SODIUM CHLORIDE 0.9 % IV BOLUS
500.0000 mL | Freq: Once | INTRAVENOUS | Status: AC
Start: 2016-11-01 — End: 2016-11-01
  Administered 2016-11-01: 500 mL via INTRAVENOUS

## 2016-11-01 MED ORDER — ALUM & MAG HYDROXIDE-SIMETH 200-200-20 MG/5ML PO SUSP
5.0000 mL | Freq: Four times a day (QID) | ORAL | 0 refills | Status: AC | PRN
Start: 2016-11-01 — End: 2016-11-08

## 2016-11-01 NOTE — Discharge Instructions (Signed)
Near Syncope     You have been diagnosed with a "Near-Fainting" spell, also called "Near-Syncope."     Syncope is the medical term for "loss of consciousness," also known as a fainting spell or simply fainting. "Near-Syncope" means almost fainting. Some causes for a complete faint can cause an "almost-faint."     There are many reasons for fainting episodes. Some fainting episodes can be from life-threatening causes while others are from non-serious causes. Most patients with life-threatening causes check into the hospital for more tests. Patients thought to have non-life threatening causes may be sent home.     Near-syncope has a few non-life threatening causes. Some are dehydration, heat exhaustion, vasovagal events (simple faint) and new medicine side-effects.     Syncope treatment depends on the cause. It is a good idea to drink a lot of fluids at least 24 hours after a near-fainting episode. Also avoid hard physical activity at least 24 hours after an episode.     Follow up with your primary care doctor for a recheck.     YOU SHOULD SEEK MEDICAL ATTENTION IMMEDIATELY, EITHER HERE OR AT THE NEAREST EMERGENCY DEPARTMENT, IF ANY OF THE FOLLOWING OCCURS:  · Repeated fainting episodes.  · Any chest pain with the fainting.  · Any palpitations or strange heart beats before fainting.  · Any blood in your stools (poop).  · Feeling weak, lightheaded or not getting better as expected.  · Any other worsening symptoms or concerns develop.             Chest Pain of Unclear Etiology     You have been seen for chest pain. The cause of your pain is not yet known.     Your doctor has learned about your medical history, examined you, and checked any tests that were done. Still, it is unclear why you are having pain. The doctor thinks there is only a very small chance that your pain is caused by a life-threatening condition. Later, your primary care doctor might do more tests or check you again.     Sometimes chest pain is  caused by a dangerous condition, like a heart attack, aorta injury, blood clot in the lung, or collapsed lung. It is unlikely that your pain is caused by a life-threatening condition if: Your chest pain lasts only a few seconds at a time; you are not short of breath, nauseated (sick to your stomach), sweaty, or lightheaded; your pain gets worse when you twist or bend; your pain improves with exercise or hard work.     Chest pain is serious. It is VERY IMPORTANT that you follow up with your regular doctor and seek medical attention immediately here or at the nearest Emergency Department if your symptoms become worse or they change.     YOU SHOULD SEEK MEDICAL ATTENTION IMMEDIATELY, EITHER HERE OR AT THE NEAREST EMERGENCY DEPARTMENT, IF ANY OF THE FOLLOWING OCCURS:  · Your pain gets worse.  · Your pain makes you short of breath, nauseated, or sweaty.  · Your pain gets worse when you walk, go up stairs, or exert yourself.  · You feel weak, lightheaded, or faint.  · It hurts to breathe.  · Your leg swells.  · Your symptoms get worse or you have new symptoms or concerns.

## 2016-11-01 NOTE — ED Triage Notes (Addendum)
Constant/ intermiten non- radiating right sided Chest pain. SOB. On and off since Friday. +Dizziness. + nausea. + cough. No fever. No V/D.

## 2016-11-01 NOTE — ED Provider Notes (Signed)
Physician/Midlevel provider first contact with patient: 11/01/16 1307         History     Chief Complaint   Patient presents with   . Chest Pain   . Dizziness   . Nausea     HPI   65 yo female with hx of htn hld and gastric bypass with event one month ago at [facility] of + troponin with negative echo and cardiology consult and [place] to home found to not be nstemi per pt.   Presents with episodic chest tightness  And sternal pressure with no radiation. With nausea, and sob. With episodes lasting several min to several hours. With episodic light head. With cough mild reported.  No loc. No palpitations. No other complaints.   Pt denies  abdominal pain,  vomiting, diarrhea and extremity pain.  No head or neck pain.   Pt denies changes in vision, hearing, sensation, strength or coordination.    Pt denies hx of  dm / smoking / drug use (including cocaine, amphetamines and ivda) / cad / fhx of cad / hx of MI / Hx of dvt or pe / travel / recent surgery   No exacerbating factors.   Past Medical History:   Diagnosis Date   . Chest pain 2016     Chest pain after eating x6  months--being followed by GI for GERD   . Constipation    . Difficulty walking     amb with cane secondary to arthritis bil knees   . Genital herpes     no current outbreak (10/03/15)   . GERD (gastroesophageal reflux disease)    . Hiatal hernia    . Hyperlipidemia    . Hypertensive disorder     Well controlled on med. Denies any SOB in last 6 months (10/03/15)   . Morbid obesity with BMI of 40.0-44.9, adult     BMI 40.9   . Nausea without vomiting    . OSA on CPAP 2015    CPAP nightly x 1 yr   . Osteoarthritis of knees, bilateral    . TIA (transient ischemic attack) 2014    TIA 2014-no residual. Patient evaulated for possible TIA 07/12/2015  @ [facility] (dischage summary in epic)-per summary CT of head was normal and patient was d/c'd       Past Surgical History:   Procedure Laterality Date   . APPENDECTOMY  >25 yrs   . EGD  01/2015   . fallopian tube surgery  >25 yrs    . INSERTION, PAIN PUMP (MEDICAL) N/A 10/22/2015    Procedure: INSERTION, PAIN PUMP (MEDICAL);  Surgeon: Nicola Police, DO;  Location: [facility];  Service: General;  Laterality: N/A;   . LAPAROSCOPIC, CHOLECYSTECTOMY, CHOLANGIOGRAM  08/13/2014   . LAPAROSCOPIC, GASTRIC BYPASS N/A 10/22/2015    Procedure: LAPAROSCOPIC, GASTRIC BYPASS;  Surgeon: Josefa Half R, DO;  Location: [facility];  Service: General;  Laterality: N/A;   . LAPAROSCOPIC, HERNIORRHAPHY, HIATAL N/A 10/22/2015    Procedure: LAPAROSCOPIC, HERNIORRHAPHY, HIATAL;  Surgeon: Jeanell Sparrow, Hamid R, DO;  Location: [facility];  Service: General;  Laterality: N/A;   . OVARY SURGERY Right >25 yrs   . SPINE SURGERY  11/2013    cervical HNP x 2, Dr. Bufford Buttner       Family History   Problem Relation Age of Onset   . Cancer Mother 75     breast cancer       Social  Social History  Substance Use Topics   . Smoking status: Never Smoker   . Smokeless tobacco: Never Used   . Alcohol use No       .     Allergies   Allergen Reactions   . Acyclovir Nausea And Vomiting   . Amoxicillin-Pot Clavulanate      Other reaction(s): gi distress   . Aspirin Nausea And Vomiting     Other reaction(s): gi distress  Upsets stomach  Able to tolerate enteric coated aspirin   . Atorvastatin      Palpitation   . Lovastatin Nausea And Vomiting   . Metronidazole Nausea And Vomiting   . Moxifloxacin Nausea And Vomiting     GI symptoms   . Other Nausea And Vomiting   . Rosuvastatin      Palpitation, chest pain, abd pain    . Statins    . Sulfa Antibiotics Nausea And Vomiting     Other reaction(s): gi distress       Home Medications             albuterol (PROVENTIL HFA;VENTOLIN HFA) 108 (90 BASE) MCG/ACT inhaler     Inhale into the lungs every 4 (four) hours as needed.        aspirin 81 MG chewable tablet     Take 81 mg by Mouth Once a Day. Indications: STOPPED     atenolol (TENORMIN) 50 MG tablet     TAKE 1 and 1/2 TABLET BY MOUTH DAILY.     Patient taking  differently:  75 mg.TAKE 1 and 1/2 TABLET BY MOUTH DAILY.         B Complex Vitamins (VITAMIN-B COMPLEX) Tab     Take  by Mouth Once a Day.     cholecalciferol (VITAMIN D-1000 MAX ST) 1000 units tablet     Take 1,000 Units by Mouth Once a Day.     desonide (DESOWEN) 0.05 % cream     Apply topically 2 (two) times daily as needed.     diazePAM (VALIUM) 2 MG tablet     Take 1 tablet (2 mg total) by mouth every 8 (eight) hours as needed for Anxiety.     docusate sodium (COLACE) 100 MG capsule     Take 100 mg by mouth daily.         fluticasone (FLONASE) 50 MCG/ACT nasal spray     2 sprays by Nasal route daily.     Patient taking differently:  2 sprays by Nasal route as needed.        lidocaine (XYLOCAINE) 2 % jelly     Apply topically as needed.     pantoprazole (PROTONIX) 40 MG tablet     Take 40 mg by mouth daily.     polyethylene glycol (MIRALAX) packet     Take 17 g by mouth daily as needed.     Patient taking differently:  17 g every other day.         valACYclovir HCL (VALTREX) 500 MG tablet     TAKE 1 TABLET(500 MG) BY MOUTH DAILY     vitamin A 16109 UNIT capsule     Take 10,000 Units by Mouth Once a Day.           Review of Systems   Constitutional: Negative for chills and fever.   HENT: Negative for sore throat.    Eyes: Negative for visual disturbance.   Respiratory: Positive for cough and shortness of breath.    Cardiovascular: Positive  for chest pain. Negative for leg swelling.   Gastrointestinal: Positive for nausea. Negative for abdominal pain, diarrhea and vomiting.   Endocrine: Negative for polyuria.   Genitourinary: Negative for dysuria, flank pain, frequency and hematuria.   Musculoskeletal: Negative for arthralgias, back pain, gait problem, neck pain and neck stiffness.   Skin: Negative for wound.   Allergic/Immunologic: Negative for immunocompromised state.   Neurological: Negative for weakness, numbness and headaches.   Hematological: Does not bruise/bleed easily.   Psychiatric/Behavioral: Negative  for agitation.       Physical Exam    BP: 142/72, Heart Rate: 62, Temp: 97.8 F (36.6 C), Resp Rate: 15, SpO2: 100 %, Weight: 61.2 kg    Physical Exam   Constitutional: She is oriented to person, place, and time. She appears well-developed and well-nourished. No distress.   HENT:   Head: Normocephalic and atraumatic.   nml posterior pharynx, no exudates.    Eyes: EOM are normal. Pupils are equal, round, and reactive to light.   Neck: Neck supple.   Cardiovascular: Normal rate, regular rhythm and normal heart sounds.  Exam reveals no gallop and no friction rub.    No murmur heard.  Pulmonary/Chest: Effort normal and breath sounds normal. No respiratory distress. She has no wheezes. She has no rales. She exhibits no tenderness.   Abdominal: Soft. Bowel sounds are normal. She exhibits no distension. There is no tenderness. There is no rebound and no guarding.   Musculoskeletal: Normal range of motion. She exhibits no edema or tenderness.   No calf swelling, tenderness or edema    Neurological: She is alert and oriented to person, place, and time. No cranial nerve deficit. Coordination normal.   nml strength and sensation and gait.     Skin: Skin is warm and dry. No rash noted. No erythema.   Psychiatric: She has a normal mood and affect.         MDM and ED Course     ED Medication Orders     Start Ordered     Status Ordering Provider    11/01/16 1435 11/01/16 1434  sodium chloride 0.9 % bolus 500 mL  Once     Route: Intravenous  Ordered Dose: 500 mL     Last MAR action:  New Bag Zaiden Ludlum S             MDM      ED Course              Procedures  ekg - nsr with no acute st abnormalities, nml intervals and axis. Interpretation by myself.    Clinical Impression & Disposition     Clinical Impression  Final diagnoses:   None    65 yo female with stated near syncope and chest pain.   Will do ekg and troponin to assess for ischemia. Will do cbc to assess for anemia and infection.  Will do bmp to assess for  electrolyte abnormalities and kidney function. Will do flu swab.  Lactate for dehydration. Dimer per wells. And cxr for infiltrate  Will give ivf.   Will reassess         Lab work shows no acute abnormalities.   cxr negative.   Pt felt well and asymptomatic.   Case reviewed with dr. Yetta Barre cardiology  -- records of [facility] admission reviewed.   Prior cardiology consult stated more likely secondary to gerd and + trop just over upper limit of normal.   Recommendation - [place] with  outpt cardiology f/u     Pt felt well. Comfortable with [place].  [place] to home with cardiology and gi f/u and f/u.     ED Disposition     None           New Prescriptions    No medications on file                 Daylene Posey, MD  11/03/16 1950

## 2016-11-02 ENCOUNTER — Encounter (INDEPENDENT_AMBULATORY_CARE_PROVIDER_SITE_OTHER): Payer: Self-pay | Admitting: Surgery

## 2016-11-02 DIAGNOSIS — Z9884 Bariatric surgery status: Secondary | ICD-10-CM

## 2016-11-02 LAB — ECG 12-LEAD
Atrial Rate: 65 {beats}/min
P Axis: 47 degrees
P-R Interval: 168 ms
Q-T Interval: 392 ms
QRS Duration: 76 ms
QTC Calculation (Bezet): 407 ms
R Axis: -5 degrees
T Axis: 23 degrees
Ventricular Rate: 65 {beats}/min

## 2016-11-03 ENCOUNTER — Telehealth (INDEPENDENT_AMBULATORY_CARE_PROVIDER_SITE_OTHER): Payer: Self-pay

## 2016-11-03 ENCOUNTER — Ambulatory Visit (INDEPENDENT_AMBULATORY_CARE_PROVIDER_SITE_OTHER): Payer: Commercial Managed Care - HMO | Admitting: Internal Medicine

## 2016-11-03 ENCOUNTER — Encounter (INDEPENDENT_AMBULATORY_CARE_PROVIDER_SITE_OTHER): Payer: Self-pay | Admitting: Internal Medicine

## 2016-11-03 ENCOUNTER — Other Ambulatory Visit (INDEPENDENT_AMBULATORY_CARE_PROVIDER_SITE_OTHER): Payer: Self-pay | Admitting: Internal Medicine

## 2016-11-03 VITALS — BP 148/68 | HR 63 | Temp 98.1°F | Resp 14 | Wt 136.8 lb

## 2016-11-03 DIAGNOSIS — Z23 Encounter for immunization: Secondary | ICD-10-CM

## 2016-11-03 DIAGNOSIS — I10 Essential (primary) hypertension: Secondary | ICD-10-CM

## 2016-11-03 DIAGNOSIS — K219 Gastro-esophageal reflux disease without esophagitis: Secondary | ICD-10-CM

## 2016-11-03 MED ORDER — ESOMEPRAZOLE MAGNESIUM 40 MG PO PACK
40.0000 mg | PACK | Freq: Every morning | ORAL | 1 refills | Status: DC
Start: 2016-11-03 — End: 2016-11-06

## 2016-11-03 MED ORDER — DIAZEPAM 2 MG PO TABS
2.0000 mg | ORAL_TABLET | Freq: Three times a day (TID) | ORAL | 2 refills | Status: DC | PRN
Start: 2016-11-03 — End: 2017-07-13

## 2016-11-03 NOTE — Telephone Encounter (Addendum)
ED Visit Follow Up Call      Facilty: [facility]    Discharge Date:11/01/16    Primary Discharge Dx: chest pain ,Syncope    Prior ER Visits/Hospitalizations (past yr): 7    Follow Up Appt with PCP/Specialist :   __01__/_02__/_18___      Outside care ordered: No    Patient Questions:    What symptoms made you go to the ED?:  Pt stated, "I was having real bad chest pain and was very nauseated."  How are you feeling today after your recent ED visit?:  "Still not feeling good,my stomach feels real bad and I am very nauseated."   Do you have any new or worsening symptoms since being seen?  If so, describe your symptoms and what you have tried to relieve your symptoms. Pt states no new or worsening sx's.    What instructions did they give you at discharge?:  "Take maalox for 7 day, f/u with gastro, cardiology and  Pcp."  Do you have your printed copy of your discharge instructions?: Yes     What questions do you have regarding your instructions? no     What new medications were prescribed at your visit or what changes to your medications were made?  The  maalox   What questions do you have regarding your discharge medications? no    Do you have assistance at home and transportation to appointments?  yes        Advised to follow up as instructed.  Patient / family member verbalizes understanding and has no other questions or concerns.   Instructed to call back if symptoms change or worsen and/or go to the emergency room or call 911 for emergency symptoms.

## 2016-11-03 NOTE — Progress Notes (Signed)
Subjective:       Patient ID: Amber [name] is a 66 y.o. female.    HPI  Pt is here for follow up   1. HTN, reported low BP readings 2-3x/week, systolic 90's,   2. Recently went to ED 2d ago for CP, CXR, EKG, labs ok, dx GERD, been taking protonix qod, sees GI, Dr. Genia Del card few weeks ago. Normal card cath 2015.     The following portions of the patient's history were reviewed and updated as appropriate: allergies, current medications, past family history, past medical history, past social history, past surgical history and problem list.    Review of Systems   Constitutional: Negative for appetite change, fever and unexpected weight change.   Respiratory: Negative for shortness of breath.    Cardiovascular: Negative for chest pain and palpitations.   Gastrointestinal: Negative for abdominal pain, nausea and vomiting.   Genitourinary: Negative for dysuria.   Neurological: Negative for syncope, weakness and numbness.     BP 148/68   Pulse 63   Temp 98.1 F (36.7 C) (Oral)   Resp 14   Wt 62.1 kg (136 lb 12.8 oz)   BMI 25.85 kg/m        Objective:    Physical Exam   Constitutional: She appears well-developed. No distress.   HENT:   Mouth/Throat: Oropharynx is clear and moist. No oropharyngeal exudate.   Eyes: Conjunctivae are normal. No scleral icterus.   Neck: Neck supple. No JVD present. Carotid bruit is not present. No thyromegaly present.   Cardiovascular: Normal rate, regular rhythm and normal heart sounds.  Exam reveals no gallop and no friction rub.    No murmur heard.  Pulmonary/Chest: Effort normal and breath sounds normal. No respiratory distress. She has no rales.   Abdominal: Soft. Bowel sounds are normal. She exhibits no distension. There is no hepatosplenomegaly. There is tenderness in the epigastric area. There is no rebound, no guarding and negative Murphy's sign.   Musculoskeletal: She exhibits no edema.   Neurological: She is alert.           Assessment:       1. Essential hypertension      2. Influenza vaccination administered at current visit  Flu vaccine QUAD PRES FREE 32YRS & GREATER   3. Gastroesophageal reflux disease, esophagitis presence not specified  esomeprazole (NEXIUM) 40 MG packet          Plan:      Procedures  Orders Placed This Encounter   Procedures   . Flu vaccine QUAD PRES FREE 32YRS & GREATER     Dose=0.35ml, Route = IM     Medications Ordered This Encounter       Disp Refills Start End    esomeprazole (NEXIUM) 40 MG packet 90 each 1 11/03/2016     Take 40 mg by mouth every morning before breakfast. - Oral        Change protonix to nexium  Reduce atenolol to 50mg  daily  Monitor BP  F/u GI, Card as directed  Flu vaccine given  F/u 3 months or sooner prn.

## 2016-11-03 NOTE — Progress Notes (Signed)
Has the patient sought any care outside of the [facility]? no  Refer to Care Team? no

## 2016-11-06 ENCOUNTER — Other Ambulatory Visit (INDEPENDENT_AMBULATORY_CARE_PROVIDER_SITE_OTHER): Payer: Self-pay | Admitting: Internal Medicine

## 2016-11-06 ENCOUNTER — Encounter (INDEPENDENT_AMBULATORY_CARE_PROVIDER_SITE_OTHER): Payer: Self-pay | Admitting: Internal Medicine

## 2016-11-06 MED ORDER — PANTOPRAZOLE SODIUM 40 MG PO TBEC
40.0000 mg | DELAYED_RELEASE_TABLET | Freq: Every day | ORAL | 1 refills | Status: DC
Start: 2016-11-06 — End: 2017-05-02

## 2016-11-09 ENCOUNTER — Other Ambulatory Visit (INDEPENDENT_AMBULATORY_CARE_PROVIDER_SITE_OTHER): Payer: Self-pay | Admitting: Surgery

## 2016-11-12 ENCOUNTER — Ambulatory Visit (INDEPENDENT_AMBULATORY_CARE_PROVIDER_SITE_OTHER): Payer: Commercial Managed Care - HMO | Admitting: Surgery

## 2016-11-12 ENCOUNTER — Ambulatory Visit (INDEPENDENT_AMBULATORY_CARE_PROVIDER_SITE_OTHER): Payer: Commercial Managed Care - HMO

## 2016-11-12 ENCOUNTER — Encounter (INDEPENDENT_AMBULATORY_CARE_PROVIDER_SITE_OTHER): Payer: Self-pay | Admitting: Surgery

## 2016-11-12 VITALS — BP 154/94 | HR 62 | Temp 97.7°F | Ht 61.0 in | Wt 135.0 lb

## 2016-11-12 DIAGNOSIS — D352 Benign neoplasm of pituitary gland: Secondary | ICD-10-CM

## 2016-11-12 DIAGNOSIS — G459 Transient cerebral ischemic attack, unspecified: Secondary | ICD-10-CM

## 2016-11-12 DIAGNOSIS — G4733 Obstructive sleep apnea (adult) (pediatric): Secondary | ICD-10-CM

## 2016-11-12 DIAGNOSIS — Z9884 Bariatric surgery status: Secondary | ICD-10-CM

## 2016-11-12 DIAGNOSIS — E66811 Obesity, class 1: Secondary | ICD-10-CM

## 2016-11-12 DIAGNOSIS — R0789 Other chest pain: Secondary | ICD-10-CM

## 2016-11-12 DIAGNOSIS — M170 Bilateral primary osteoarthritis of knee: Secondary | ICD-10-CM

## 2016-11-12 DIAGNOSIS — Z713 Dietary counseling and surveillance: Secondary | ICD-10-CM

## 2016-11-12 DIAGNOSIS — K5901 Slow transit constipation: Secondary | ICD-10-CM

## 2016-11-12 DIAGNOSIS — N3281 Overactive bladder: Secondary | ICD-10-CM

## 2016-11-12 DIAGNOSIS — E669 Obesity, unspecified: Secondary | ICD-10-CM

## 2016-11-12 DIAGNOSIS — E559 Vitamin D deficiency, unspecified: Secondary | ICD-10-CM

## 2016-11-12 LAB — VITAMIN D-25 HYDROXY (D2/D3/TOTAL)
Vitamin D, 25-OH, D2: 5 ng/mL
Vitamin D, 25-OH, D3: 37 ng/mL
Vitamin D, 25-OH, Total: 42 ng/mL (ref 30–100)

## 2016-11-12 NOTE — Progress Notes (Signed)
Assessment:  Status Post Laparoscopic RNY-Gastric Bypass      Plan:  1.  Patient is to continue with portion control  2.  Patient is to increase exercise.  3.  Patient is to continue with supplements.  4.  Patient is to follow up with PCP as needed.  5.  Patient is to follow up in 6 month(s) with labs          Subjective:  [name] returns for follow up 1 year(s) post laparoscopic RNY-Gastric Bypass. She is doing well with 83 lbs. total weight loss. She is tolerating a regular diet .  She has occasional nausea but no vomiting.  Recently she was admitted for chest pain overnight.  She is having bowel movements.  She states that she gets pain after bowel movements.  She denies any fevers or chills.  She is walking regularly.        Objective:  The following portions of the patient's history were reviewed and updated as appropriate: allergies, current medications, past family history, past medical history, past social history, past surgical history and problem list.    General appearance: alert, appears stated age and cooperative  Head: Normocephalic, without obvious abnormality, atraumatic  Eyes: negative findings: conjunctivae and sclerae normal  Neck: no adenopathy, no carotid bruit, no JVD, supple, symmetrical, trachea midline and thyroid not enlarged, symmetric, no tenderness/mass/nodules  Lungs: clear to auscultation bilaterally  Heart: regular rate and rhythm, S1, S2 normal, no murmur, click, rub or gallop  Abdomen: soft, non-tender; bowel sounds normal; no masses,  no organomegaly  Extremities: extremities normal, atraumatic, no cyanosis or edema and Homans sign is negative, no sign of DVT      Admission on 11/01/2016, Discharged on 11/01/2016   Component Date Value Ref Range Status   . Ventricular Rate 11/01/2016 65  BPM Final   . Atrial Rate 11/01/2016 65  BPM Final   . P-R Interval 11/01/2016 168  ms Final   . QRS Duration 11/01/2016 76  ms Final   . Q-T Interval 11/01/2016 392  ms Final    . QTC Calculation (Bezet) 11/01/2016 407  ms Final   . P Axis 11/01/2016 47  degrees Final   . R Axis 11/01/2016 -5  degrees Final   . T Axis 11/01/2016 23  degrees Final   . Glucose 11/01/2016 80  70 - 100 mg/dL Final    Comment: ADA guidelines for diabetes mellitus:  Fasting:  Equal to or greater than 126 mg/dL  Random:   Equal to or greater than 200 mg/dL     . BUN 11/01/2016 10.0  7.0 - 19.0 mg/dL Final   . Creatinine 16/08/9603 0.7  0.6 - 1.0 mg/dL Final   . Calcium 54/07/8118 10.5  8.5 - 10.5 mg/dL Final   . Sodium 14/78/2956 142  136 - 145 mEq/L Final   . Potassium 11/01/2016 3.9  3.5 - 5.1 mEq/L Final   . Chloride 11/01/2016 103  100 - 111 mEq/L Final   . CO2 11/01/2016 25  22 - 29 mEq/L Final   . Anion Gap 11/01/2016 14.0  5.0 - 15.0 Final   . Troponin I 11/01/2016 <0.01  0.00 - 0.09 ng/mL Final    Comment: Results equal to or greater than 0.3 ng/mL are considered  positive for AMI.  Results less than 0.1 ng/mL are  negative for AMI.  If Troponin I is between 0.1 and 0.29 ng/mL, it should be  repeated if myocardial  injury is still a clinical  consideration.  The triage of patients with chest pain should be based on  serial samples.  Troponin I results should be interpreted  in the context of the overall clinical picture including  clinical history, ECG and other laboratory tests.  The presence of heterophilic antibodies in certain  individuals can interfere with immunoassays and cause  false positive results.     . WBC 11/01/2016 5.08  3.50 - 10.80 x10 3/uL Final   . Hgb 11/01/2016 14.1  12.0 - 16.0 g/dL Final   . Hematocrit 16/08/9603 42.9  37.0 - 47.0 % Final   . Platelets 11/01/2016 234  140 - 400 x10 3/uL Final   . RBC 11/01/2016 4.94  4.20 - 5.40 x10 6/uL Final   . MCV 11/01/2016 86.8  80.0 - 100.0 fL Final   . MCH 11/01/2016 28.5  28.0 - 32.0 pg Final   . MCHC 11/01/2016 32.9  32.0 - 36.0 g/dL Final   . RDW 54/07/8118 15  12 - 15 % Final   . MPV 11/01/2016 10.1  9.4 - 12.3 fL Final   . Neutrophils  11/01/2016 63.1  None % Final   . Lymphocytes Automated 11/01/2016 27.0  None % Final   . Monocytes 11/01/2016 8.1  None % Final   . Eosinophils Automated 11/01/2016 0.8  None % Final   . Basophils Automated 11/01/2016 0.6  None % Final   . Immature Granulocyte 11/01/2016 0.4  None % Final   . Nucleated RBC 11/01/2016 0.0  0.0 - 1.0 /100 WBC Final   . Neutrophils Absolute 11/01/2016 3.21  1.80 - 8.10 x10 3/uL Final   . Abs Lymph Automated 11/01/2016 1.37  0.50 - 4.40 x10 3/uL Final   . Abs Mono Automated 11/01/2016 0.41  0.00 - 1.20 x10 3/uL Final   . Abs Eos Automated 11/01/2016 0.04  0.00 - 0.70 x10 3/uL Final   . Absolute Baso Automated 11/01/2016 0.03  0.00 - 0.20 x10 3/uL Final   . Absolute Immature Granulocyte 11/01/2016 0.02  0 x10 3/uL Final   . Absolute NRBC 11/01/2016 0.00  0 x10 3/uL Final   . EGFR 11/01/2016 >60.0   Final    Comment: Disease State Reference Ranges:    Chronic Kidney Disease; < 60 ml/min/1.73 sq.m    Kidney Failure; < 15 ml/min/1.73 sq.m    [Calculated using IDMS-Traceable MDRD equation (based on    gender, age and black vs. non-black race) recommended by    [employer] Kidney Disease Education Program. No data    available for non-white, non-black race.]  GFR estimates are unreliable in patients with:    Rapidly changing kidney function or recent dialysis,    extreme age, body size or body composition(obesity,    severe malnutrition). Abnormal muscle mass (limb    amputation, muscle wasting). In these patients,    alternative determinations of GFR should be obtained.     Marland Kitchen i-STAT Lactic acid 11/01/2016 0.9  0.2 - 2.0 mmol/L Final   . D-Dimer 11/01/2016 0.47  0.00 - 0.51 ug/mL FEU Final    Comment: Negative predictive value (NPV) for DVT and PE of <0.50  ug/mL FEU has a published NPV of 99.7%, in patients with  a first episode of suspected venous thrombosis, and  a low or moderate pretest probability for DVT.  Normal Reference Range is 0-0.51 ug/mL FEU.  D-Dimer level increases with age.      . Ventricular Rate 11/01/2016 65  BPM Preliminary   . Atrial Rate 11/01/2016 65  BPM Preliminary   . P-R Interval 11/01/2016 168  ms Preliminary   . QRS Duration 11/01/2016 76  ms Preliminary   . Q-T Interval 11/01/2016 392  ms Preliminary   . QTC Calculation (Bezet) 11/01/2016 407  ms Preliminary   . P Axis 11/01/2016 47  degrees Preliminary   . R Axis 11/01/2016 -5  degrees Preliminary   . T Axis 11/01/2016 23  degrees Preliminary   Office Visit on 09/07/2016   Component Date Value Ref Range Status   . POCT Glucose WB 09/07/2016 7.8* 70 - 100 mg/dL Final   Admission on 16/08/9603, Discharged on 09/04/2016   Component Date Value Ref Range Status   . Ventricular Rate 09/04/2016 69  BPM Final   . Atrial Rate 09/04/2016 69  BPM Final   . P-R Interval 09/04/2016 152  ms Final   . QRS Duration 09/04/2016 80  ms Final   . Q-T Interval 09/04/2016 388  ms Final   . QTC Calculation (Bezet) 09/04/2016 415  ms Final   . P Axis 09/04/2016 47  degrees Final   . R Axis 09/04/2016 4  degrees Final   . T Axis 09/04/2016 34  degrees Final   . WBC 09/04/2016 7.07  3.50 - 10.80 x10 3/uL Final   . Hgb 09/04/2016 13.0  12.0 - 16.0 g/dL Final   . Hematocrit 54/07/8118 38.8  37.0 - 47.0 % Final   . Platelets 09/04/2016 280  140 - 400 x10 3/uL Final   . RBC 09/04/2016 4.62  4.20 - 5.40 x10 6/uL Final   . MCV 09/04/2016 84.0  80.0 - 100.0 fL Final   . MCH 09/04/2016 28.1  28.0 - 32.0 pg Final   . MCHC 09/04/2016 33.5  32.0 - 36.0 g/dL Final   . RDW 14/78/2956 15  12 - 15 % Final   . MPV 09/04/2016 9.6  9.4 - 12.3 fL Final   . Neutrophils 09/04/2016 62.3  None % Final   . Lymphocytes Automated 09/04/2016 27.4  None % Final   . Monocytes 09/04/2016 8.6  None % Final   . Eosinophils Automated 09/04/2016 0.8  None % Final   . Basophils Automated 09/04/2016 0.6  None % Final   . Immature Granulocyte 09/04/2016 0.3  None % Final   . Nucleated RBC 09/04/2016 0.0  0.0 - 1.0 /100 WBC Final   . Neutrophils Absolute 09/04/2016 4.40  1.80 - 8.10 x10  3/uL Final   . Abs Lymph Automated 09/04/2016 1.94  0.50 - 4.40 x10 3/uL Final   . Abs Mono Automated 09/04/2016 0.61  0.00 - 1.20 x10 3/uL Final   . Abs Eos Automated 09/04/2016 0.06  0.00 - 0.70 x10 3/uL Final   . Absolute Baso Automated 09/04/2016 0.04  0.00 - 0.20 x10 3/uL Final   . Absolute Immature Granulocyte 09/04/2016 0.02  0 x10 3/uL Final   . Absolute NRBC 09/04/2016 0.00  0 x10 3/uL Final   . PT 09/04/2016 14.4  12.6 - 15.0 sec Final   . PT INR 09/04/2016 1.1  0.9 - 1.1 Final    Comment: Recommended Ranges for Protime INR:    2.0-3.0 for most medical and surgical thromboembolic states    2.5-3.5 for artificial heart valves  INR result may not represent exact Warfarin dosing level during  the transition period from Heparin to Warfarin therapy.  Recommend close clinical monitoring.     Marland Kitchen  PT Anticoag. Given Within 48 hrs. 09/04/2016 None   Final   . PTT 09/04/2016 27  23 - 37 sec Final   . Glucose 09/04/2016 95  70 - 100 mg/dL Final    Comment: ADA guidelines for diabetes mellitus:  Fasting:  Equal to or greater than 126 mg/dL  Random:   Equal to or greater than 200 mg/dL     . BUN 09/04/2016 12.0  7.0 - 19.0 mg/dL Final   . Creatinine 16/08/9603 0.7  0.6 - 1.0 mg/dL Final   . Sodium 54/07/8118 142  136 - 145 mEq/L Final   . Potassium 09/04/2016 3.5  3.5 - 5.1 mEq/L Final   . Chloride 09/04/2016 107  100 - 111 mEq/L Final   . CO2 09/04/2016 24  22 - 29 mEq/L Final   . Calcium 09/04/2016 9.8  8.5 - 10.5 mg/dL Final   . Protein, Total 09/04/2016 6.6  6.0 - 8.3 g/dL Final   . Albumin 14/78/2956 3.9  3.5 - 5.0 g/dL Final   . AST (SGOT) 21/30/8657 18  5 - 34 U/L Final   . ALT 09/04/2016 16  0 - 55 U/L Final   . Alkaline Phosphatase 09/04/2016 102  37 - 106 U/L Final   . Bilirubin, Total 09/04/2016 0.5  0.2 - 1.2 mg/dL Final   . Globulin 84/69/6295 2.7  2.0 - 3.6 g/dL Final   . Albumin/Globulin Ratio 09/04/2016 1.4  0.9 - 2.2 Final   . Anion Gap 09/04/2016 11.0  5.0 - 15.0 Final   . Lipase 09/04/2016 12  8 - 78  U/L Final   . Troponin I 09/04/2016 <0.01  0.00 - 0.09 ng/mL Final    Comment: Results equal to or greater than 0.3 ng/mL are considered  positive for AMI.  Results less than 0.1 ng/mL are  negative for AMI.  If Troponin I is between 0.1 and 0.29 ng/mL, it should be  repeated if myocardial injury is still a clinical  consideration.  The triage of patients with chest pain should be based on  serial samples.  Troponin I results should be interpreted  in the context of the overall clinical picture including  clinical history, ECG and other laboratory tests.  The presence of heterophilic antibodies in certain  individuals can interfere with immunoassays and cause  false positive results.     Marland Kitchen EGFR 09/04/2016 >60.0   Final    Comment: Disease State Reference Ranges:    Chronic Kidney Disease; < 60 ml/min/1.73 sq.m    Kidney Failure; < 15 ml/min/1.73 sq.m    [Calculated using IDMS-Traceable MDRD equation (based on    gender, age and black vs. non-black race) recommended by    [employer] Kidney Disease Education Program. No data    available for non-white, non-black race.]  GFR estimates are unreliable in patients with:    Rapidly changing kidney function or recent dialysis,    extreme age, body size or body composition(obesity,    severe malnutrition). Abnormal muscle mass (limb    amputation, muscle wasting). In these patients,    alternative determinations of GFR should be obtained.     . Urine Type 09/04/2016 Clean Catch   Final   . Color, UA 09/04/2016 Colorless  Clear - Yellow Final   . Clarity, UA 09/04/2016 Clear  Clear - Hazy Final   . Specific Gravity UA 09/04/2016 1.005  1.001 - 1.035 Final   . Urine pH 09/04/2016 8.0  5.0 - 8.0 Final   .  Leukocyte Esterase, UA 09/04/2016 SMALL* Negative Final   . Nitrite, UA 09/04/2016 NEGATIVE  Negative Final   . Protein, UR 09/04/2016 NEGATIVE  Negative Final   . Glucose, UA 09/04/2016 NEGATIVE  Negative Final   . Ketones UA 09/04/2016 NEGATIVE  Negative Final   .  Urobilinogen, UA 09/04/2016 <2.0  0.2 - 2.0 mg/dL Final   . Bilirubin, UA 09/04/2016 NEGATIVE  Negative Final   . Blood, UA 09/04/2016 NEGATIVE  Negative Final   . WBC, UA 09/04/2016 6 - 10* 0 - 5 /hpf Final   . Squamous Epithelial Cells, Urine 09/04/2016 0 - 5  0 - 25 /hpf Final   . Urine Amorphous 09/04/2016 Few* Occasional /hpf Final   . Ventricular Rate 09/04/2016 69  BPM Preliminary   . Atrial Rate 09/04/2016 69  BPM Preliminary   . P-R Interval 09/04/2016 152  ms Preliminary   . QRS Duration 09/04/2016 80  ms Preliminary   . Q-T Interval 09/04/2016 388  ms Preliminary   . QTC Calculation (Bezet) 09/04/2016 415  ms Preliminary   . P Axis 09/04/2016 47  degrees Preliminary   . R Axis 09/04/2016 4  degrees Preliminary   . T Axis 09/04/2016 34  degrees Preliminary       Chest 2 Views    Result Date: 11/01/2016  HISTORY: Chest pain TECHNIQUE: A two view exam was performed. FINDINGS: The lungs are clear.  The heart, mediastinum and bony thorax overall were unremarkable for age.       No radiographic evidence for acute cardiopulmonary disease. Lorenda Peck, MD 11/01/2016 1:25 PM  .         Chest 2 Views    Result Date: 11/01/2016  HISTORY: Chest pain TECHNIQUE: A two view exam was performed. FINDINGS: The lungs are clear.  The heart, mediastinum and bony thorax overall were unremarkable for age.       No radiographic evidence for acute cardiopulmonary disease. Lorenda Peck, MD 11/01/2016 1:25 PM      Modesty Rudy R. Sven Pinheiro, DO, FACS, FASMBS, FACOS

## 2016-11-12 NOTE — Progress Notes (Signed)
Pt presents for nutrition follow-up post 12 month post gastric bypass.  Pt reports being very happy with weight loss.  Pt reports she is at her goal weight, reports she may lose more but is pleased with current weight.    Pt reports following all nutrition guidelines: chewing food 20 times/ following 30/30 rule; measuring portions; current portion size is around 2-21/2 ounces meat protein plus produce/meal.  Pt reports she is drinking 54 grams protein/daily from supplement (BA HPMR Drink).    Currently, pt is able to consume about 64 or more oz clear fluids daily.  Pt continues with all recommended vitamin/mineral supplements at this time (multi, iron, vit a, vit d, b-50 complex).  For physical activity, pt reports participating in: 3 days week routinely walks or uses pedel.  Pt reports no issues with N/V/C/D at this time.    O:  Today's Wt:  See below        Previous Visit Wt:    Wt Readings from Last 10 Encounters:   11/12/16 135 lb   11/03/16 136 lb 12.8 oz   11/01/16 135 lb   10/13/16 133 lb   09/25/16 137 lb   09/08/16 140 lb   09/07/16 141 lb   09/04/16 137 lb   08/13/16 146 lb   08/09/16 140 lb     [facility] Vitals and Weights  11/12/2016   BP 154/94   Pulse 62   Height 5\' 1"    Initial Weight 218 lb   Weight 135 lb   Previous Weight 140 lb   Weight change since last visit -5   Total Weight Loss -83   Initial BMI 41.28   BMI (calculated) 25.6     Surgery Date: 10/22/2015  Pertinent Lab Values:    Allergies:    Allergies   Allergen Reactions   . Acyclovir Nausea And Vomiting   . Amoxicillin-Pot Clavulanate      Other reaction(s): gi distress   . Aspirin Nausea And Vomiting     Other reaction(s): gi distress  Upsets stomach  Able to tolerate enteric coated aspirin   . Atorvastatin      Palpitation   . Lovastatin Nausea And Vomiting   . Metronidazole Nausea And Vomiting   . Moxifloxacin Nausea And Vomiting     GI symptoms   . Other Nausea And Vomiting   . Rosuvastatin      Palpitation, chest pain, abd  pain    . Statins    . Sulfa Antibiotics Nausea And Vomiting     Other reaction(s): gi distress       Diet Recall:   Breakfast: cottage cheese (low fat) with unsweet peaches on   Lunch: chicken veggie soup (homemade, made with stewed tomatoes, mixed veggies and chicken breast.   Dinner: same at dinner   Snacks:   Beverages:   Alcohol:      Pertinent Lab Values:  Pending.  Allergies:    Allergies   Allergen Reactions   . Aspirin Swelling     Swelling and bloating in the face and nosebleeds   . Asa Buff (Mag [Buffered Aspirin] Swelling     Nose bleed and swelling of the face         Estimated Total Intake: 76 grams/ 1355 calories    Nutrition Assessment/Diagnosis   Pt with the resolving condition of obesity with limited adherence to nutrition related recommendations as evidenced by report,  recall (acceptable protein/adequate fluid; ); and  Acceptable  Rate of  weight loss.      Nutrition Intervention:  Reviewed vitamin/supplement needs, advised pt to increase multi to 2x/day; calcium 3x/day; b-50 complex 2 capsules/day; and b-12 qod.  Pt verbalized understanding.  Reviewed portion sizes and nutrition of foods (nuts) for 6 months post-op and provided pt with sample meal pattern/portion sizes of foods for stage.  Pt appears motivated to continue monitoring calories,  Increase exercise or track to make sure reaches goal of 150 minutes/week.     P:  1.  Pt to continue to measure portions, 2 ounces protein/ 1/4 cup produce; 1-2 tablespoons healthy carb /meal.  2.  Pt to continue using protein rich foods/supplements for snacks at this time.  3.  Pt to f/u in 3 months or PRN.  Pt provided contact information for any questions or concerns.    Spent a total of 15 minutes educating pt in a individual one-on-one setting.  Plan reviewed with Dr. Jeanell Sparrow

## 2016-11-13 LAB — COMPREHENSIVE METABOLIC PANEL
ALT: 13 U/L (ref 6–29)
AST (SGOT): 17 U/L (ref 10–35)
Albumin/Globulin Ratio: 2 (calc) (ref 1.0–2.5)
Albumin: 4.2 g/dL (ref 3.6–5.1)
Alkaline Phosphatase: 85 U/L (ref 33–130)
BUN / Creatinine Ratio: 29 (calc) — ABNORMAL HIGH (ref 6–22)
BUN: 14 mg/dL (ref 7–25)
Bilirubin, Total: 0.5 mg/dL (ref 0.2–1.2)
CO2: 28 mmol/L (ref 20–31)
Calcium: 9.7 mg/dL (ref 8.6–10.4)
Chloride: 105 mmol/L (ref 98–110)
Creatinine: 0.49 mg/dL — ABNORMAL LOW (ref 0.50–0.99)
EGFR African American: 119 mL/min/{1.73_m2} (ref >=60)
Globulin: 2.1 g/dL (calc) (ref 1.9–3.7)
Glucose: 73 mg/dL (ref 65–99)
NON-AFRICAN AMERICA EGFR: 103 mL/min/{1.73_m2} (ref >=60)
Potassium: 3.7 mmol/L (ref 3.5–5.3)
Protein, Total: 6.3 g/dL (ref 6.1–8.1)
Sodium: 142 mmol/L (ref 135–146)

## 2016-11-13 LAB — LIPID PANEL
Cholesterol / HDL Ratio: 2.4 (calc) (ref <5.0)
Cholesterol: 169 mg/dL (ref <200)
HDL: 70 mg/dL (ref >50)
LDL Calculated: 86 mg/dL (calc)
NON HDL CHOLESTEROL: 99 mg/dL (calc) (ref <130)
Triglycerides: 51 mg/dL (ref <150)

## 2016-11-13 LAB — CBC
Hematocrit: 38.7 % (ref 35.0–45.0)
Hemoglobin: 12.4 g/dL (ref 11.7–15.5)
MCH: 27.7 pg (ref 27.0–33.0)
MCHC: 32 g/dL (ref 32.0–36.0)
MCV: 86.6 fL (ref 80.0–100.0)
MPV: 10.6 fL (ref 7.5–12.5)
Platelets: 219 10*3/uL (ref 140–400)
RBC: 4.47 10*6/uL (ref 3.80–5.10)
RDW: 14.4 % (ref 11.0–15.0)
WBC: 4.3 10*3/uL (ref 3.8–10.8)

## 2016-11-13 LAB — PTH, INTACT AND CALCIUM
Calcium: 9.7 mg/dL (ref 8.6–10.4)
PARATHYROID HORMONE INTACT: 34 pg/mL (ref 14–64)

## 2016-11-13 LAB — IRON PROFILE
Iron Saturation: 20 % (calc) (ref 11–50)
Iron: 72 ug/dL (ref 45–160)
TIBC: 364 mcg/dL (calc) (ref 250–450)

## 2016-11-13 LAB — COPPER, SERUM: Copper: 121 ug/dL (ref 70–175)

## 2016-11-13 LAB — VITAMIN B12: Vitamin B-12: 596 pg/mL (ref 200–1100)

## 2016-11-13 LAB — VITAMIN A: Vitamin A: 42 ug/dL (ref 38–98)

## 2016-11-13 LAB — VITAMIN B1, WHOLE BLOOD: Thiamin (Vitamin B1), WB: 106 nmol/L (ref 78–185)

## 2016-11-13 LAB — TSH: TSH: 0.94 mIU/L (ref 0.40–4.50)

## 2016-11-13 LAB — FOLATE: Folate: 16.9 ng/mL

## 2016-12-14 ENCOUNTER — Other Ambulatory Visit (HOSPITAL_BASED_OUTPATIENT_CLINIC_OR_DEPARTMENT_OTHER): Payer: Self-pay

## 2016-12-28 ENCOUNTER — Other Ambulatory Visit (HOSPITAL_BASED_OUTPATIENT_CLINIC_OR_DEPARTMENT_OTHER): Payer: Self-pay

## 2017-01-02 ENCOUNTER — Emergency Department: Payer: Commercial Managed Care - HMO

## 2017-01-02 ENCOUNTER — Observation Stay
Admission: EM | Admit: 2017-01-02 | Discharge: 2017-01-02 | Disposition: A | Discharge status: Home / Self Care | Payer: Commercial Managed Care - HMO | Attending: Internal Medicine | Admitting: Internal Medicine

## 2017-01-02 ENCOUNTER — Observation Stay: Payer: Commercial Managed Care - HMO | Admitting: Internal Medicine

## 2017-01-02 DIAGNOSIS — M170 Bilateral primary osteoarthritis of knee: Secondary | ICD-10-CM | POA: Insufficient documentation

## 2017-01-02 DIAGNOSIS — F419 Anxiety disorder, unspecified: Secondary | ICD-10-CM

## 2017-01-02 DIAGNOSIS — I10 Essential (primary) hypertension: Secondary | ICD-10-CM | POA: Insufficient documentation

## 2017-01-02 DIAGNOSIS — R079 Chest pain, unspecified: Secondary | ICD-10-CM | POA: Diagnosis present

## 2017-01-02 DIAGNOSIS — Z8673 Personal history of transient ischemic attack (TIA), and cerebral infarction without residual deficits: Secondary | ICD-10-CM | POA: Insufficient documentation

## 2017-01-02 DIAGNOSIS — K449 Diaphragmatic hernia without obstruction or gangrene: Secondary | ICD-10-CM | POA: Insufficient documentation

## 2017-01-02 DIAGNOSIS — G4733 Obstructive sleep apnea (adult) (pediatric): Secondary | ICD-10-CM | POA: Insufficient documentation

## 2017-01-02 DIAGNOSIS — R002 Palpitations: Secondary | ICD-10-CM | POA: Insufficient documentation

## 2017-01-02 DIAGNOSIS — Z6841 Body Mass Index (BMI) 40.0 and over, adult: Secondary | ICD-10-CM | POA: Insufficient documentation

## 2017-01-02 DIAGNOSIS — E785 Hyperlipidemia, unspecified: Secondary | ICD-10-CM | POA: Insufficient documentation

## 2017-01-02 DIAGNOSIS — K219 Gastro-esophageal reflux disease without esophagitis: Secondary | ICD-10-CM | POA: Insufficient documentation

## 2017-01-02 LAB — COMPREHENSIVE METABOLIC PANEL
ALT: 12 U/L (ref 0–55)
AST (SGOT): 21 U/L (ref 5–34)
Albumin/Globulin Ratio: 1.4 (ref 0.9–2.2)
Albumin: 4.6 g/dL (ref 3.5–5.0)
Alkaline Phosphatase: 102 U/L (ref 37–106)
Anion Gap: 14 (ref 5.0–15.0)
BUN: 17 mg/dL (ref 7.0–19.0)
Bilirubin, Total: 0.5 mg/dL (ref 0.2–1.2)
CO2: 24 mEq/L (ref 22–29)
Calcium: 10.2 mg/dL (ref 8.5–10.5)
Chloride: 105 mEq/L (ref 100–111)
Creatinine: 0.8 mg/dL (ref 0.6–1.0)
Globulin: 3.2 g/dL (ref 2.0–3.6)
Glucose: 85 mg/dL (ref 70–100)
Potassium: 3.4 mEq/L — ABNORMAL LOW (ref 3.5–5.1)
Protein, Total: 7.8 g/dL (ref 6.0–8.3)
Sodium: 143 mEq/L (ref 136–145)

## 2017-01-02 LAB — CBC AND DIFFERENTIAL
Absolute NRBC: 0 10*3/uL
Basophils Absolute Automated: 0.04 10*3/uL (ref 0.00–0.20)
Basophils Automated: 0.6 %
Eosinophils Absolute Automated: 0.12 10*3/uL (ref 0.00–0.70)
Eosinophils Automated: 1.8 %
Hematocrit: 42.2 % (ref 37.0–47.0)
Hgb: 14.3 g/dL (ref 12.0–16.0)
Immature Granulocytes Absolute: 0.01 10*3/uL
Immature Granulocytes: 0.1 %
Lymphocytes Absolute Automated: 2.51 10*3/uL (ref 0.50–4.40)
Lymphocytes Automated: 37.6 %
MCH: 28.5 pg (ref 28.0–32.0)
MCHC: 33.9 g/dL (ref 32.0–36.0)
MCV: 84.2 fL (ref 80.0–100.0)
MPV: 10.2 fL (ref 9.4–12.3)
Monocytes Absolute Automated: 0.54 10*3/uL (ref 0.00–1.20)
Monocytes: 8.1 %
Neutrophils Absolute: 3.45 10*3/uL (ref 1.80–8.10)
Neutrophils: 51.8 %
Nucleated RBC: 0 /100 WBC (ref 0.0–1.0)
Platelets: 269 10*3/uL (ref 140–400)
RBC: 5.01 10*6/uL (ref 4.20–5.40)
RDW: 14 % (ref 12–15)
WBC: 6.67 10*3/uL (ref 3.50–10.80)

## 2017-01-02 LAB — ECG 12-LEAD
Atrial Rate: 62 {beats}/min
Atrial Rate: 79 {beats}/min
P Axis: 60 degrees
P Axis: 70 degrees
P-R Interval: 148 ms
P-R Interval: 174 ms
Q-T Interval: 380 ms
Q-T Interval: 420 ms
QRS Duration: 78 ms
QRS Duration: 88 ms
QTC Calculation (Bezet): 426 ms
QTC Calculation (Bezet): 435 ms
R Axis: -2 degrees
R Axis: 0 degrees
T Axis: 24 degrees
T Axis: 37 degrees
Ventricular Rate: 62 {beats}/min
Ventricular Rate: 79 {beats}/min

## 2017-01-02 LAB — BASIC METABOLIC PANEL
Anion Gap: 9 (ref 5.0–15.0)
BUN: 19 mg/dL (ref 7–19)
CO2: 26 mEq/L (ref 22–29)
Calcium: 9.5 mg/dL (ref 8.5–10.5)
Chloride: 108 mEq/L (ref 100–111)
Creatinine: 0.7 mg/dL (ref 0.6–1.0)
Glucose: 75 mg/dL (ref 70–100)
Potassium: 3.6 mEq/L (ref 3.5–5.1)
Sodium: 143 mEq/L (ref 136–145)

## 2017-01-02 LAB — TROPONIN I
Troponin I: <0.01 ng/mL (ref 0.00–0.09)
Troponin I: <0.01 ng/mL (ref 0.00–0.09)
Troponin I: <0.01 ng/mL (ref 0.00–0.09)

## 2017-01-02 LAB — GFR
EGFR: >60
EGFR: >60

## 2017-01-02 LAB — IHS D-DIMER: D-Dimer: 0.33 ug/mL FEU (ref 0.00–0.70)

## 2017-01-02 MED ORDER — NALOXONE HCL 0.4 MG/ML IJ SOLN (WRAP)
0.2000 mg | INTRAMUSCULAR | Status: DC | PRN
Start: 2017-01-02 — End: 2017-01-02

## 2017-01-02 MED ORDER — LIDOCAINE VISCOUS 2 % MT SOLN
10.0000 mL | Freq: Once | OROMUCOSAL | Status: AC
Start: 2017-01-02 — End: 2017-01-02
  Administered 2017-01-02: 01:00:00 10 mL via OROMUCOSAL
  Filled 2017-01-02: qty 15

## 2017-01-02 MED ORDER — ALBUTEROL SULFATE (2.5 MG/3ML) 0.083% IN NEBU
2.5000 mg | INHALATION_SOLUTION | Freq: Four times a day (QID) | RESPIRATORY_TRACT | Status: DC | PRN
Start: 2017-01-02 — End: 2017-01-02

## 2017-01-02 MED ORDER — ENOXAPARIN SODIUM 40 MG/0.4ML SC SOLN
40.0000 mg | Freq: Every day | SUBCUTANEOUS | Status: DC
Start: 2017-01-02 — End: 2017-01-02
  Administered 2017-01-02: 10:00:00 40 mg via SUBCUTANEOUS
  Filled 2017-01-02: qty 0.4

## 2017-01-02 MED ORDER — KETOROLAC TROMETHAMINE 30 MG/ML IJ SOLN
15.0000 mg | Freq: Once | INTRAMUSCULAR | Status: AC
Start: 2017-01-02 — End: 2017-01-02
  Administered 2017-01-02: 02:00:00 15 mg via INTRAVENOUS
  Filled 2017-01-02: qty 1

## 2017-01-02 MED ORDER — ALUM & MAG HYDROXIDE-SIMETH 200-200-20 MG/5ML PO SUSP
30.0000 mL | Freq: Once | ORAL | Status: AC
Start: 2017-01-02 — End: 2017-01-02
  Administered 2017-01-02: 01:00:00 30 mL via ORAL
  Filled 2017-01-02: qty 30

## 2017-01-02 MED ORDER — FAMOTIDINE 10 MG/ML IV SOLN (WRAP)
20.0000 mg | Freq: Two times a day (BID) | INTRAVENOUS | Status: DC
Start: 2017-01-02 — End: 2017-01-02
  Administered 2017-01-02: 10:00:00 20 mg via INTRAVENOUS
  Filled 2017-01-02: qty 2

## 2017-01-02 MED ORDER — LORAZEPAM 2 MG/ML IJ SOLN
1.0000 mg | Freq: Once | INTRAMUSCULAR | Status: AC
Start: 2017-01-02 — End: 2017-01-02
  Administered 2017-01-02: 02:00:00 1 mg via INTRAVENOUS
  Filled 2017-01-02: qty 1

## 2017-01-02 MED ORDER — ASPIRIN 81 MG PO CHEW
81.0000 mg | CHEWABLE_TABLET | Freq: Every day | ORAL | Status: DC
Start: 2017-01-02 — End: 2017-01-02
  Administered 2017-01-02: 10:00:00 81 mg via ORAL
  Filled 2017-01-02: qty 1

## 2017-01-02 NOTE — Plan of Care (Signed)
Problem: Chest Pain  Goal: Vital signs and cardiac rhythm stable  Outcome: Progressing   01/02/17 9147   Goal/Interventions addressed this shift   Vital signs and cardiac rhythm stable Monitor Amber [name] vital signs/cardiac rhythms;Assess the need for oxygen therapy and administer as ordered;Monitor labs     Goal: Cardiac pain management  Outcome: Progressing   01/02/17 8295   Goal/Interventions addressed this shift   Cardiac pain management  Assess/report chest pain/or related discomfort to LIP immediately;Instruct patient to report any change in pain status;Assess pain/or related discomfort on admission, during daily assessment, before and after any intervention;Include patient and patient care companion in decisions related to pain management

## 2017-01-02 NOTE — Consults (Signed)
[name]); [phone] (follow-ups)  [facility] [phone]  OFFICE Number [phone]  MD LINE [phone]    Reason for Consultation:   Chest pain    Assessment:   66 y.o. female with a history of HTN, hiatal hernia, GERD, morbid obesity s/p gastric surgery, OSA, TIA, who presents with palpitations and chest pain.    Plan:   1. Chest pain.  No evidence of acute coronary syndrome by serial cardiac enzymes and EKG thus far.  She has had prior extensive workups for chest pain including normal cardiac caths x2 most recently in 12/2013.  No ischemic eval indicated at this time.    2. Palpitations.  No arrhythmias noted overnight on tele.  Outpt follow up if she continues to have palpitations.   3. HTN.  Reasonably controlled at this time.  Outpt follow up.     No further inpatient cardiac workup needed at this time.  Ok for d/c from cardiac perspective.  Patient instructed to f/u with Dr. Katrinka Blazing upon discharge.     History:   Amber [name] is a 66 y.o. female with a history of HTN, hiatal hernia, GERD, morbid obesity s/p gastric surgery, OSA, TIA, who presents with palpitations and chest pain.  She started to have abdominal/epigastric discomfort yesterday.  It was intermittent with some L arm heaviness.  This went away but at night she started to feel palpitations - both a forceful heartbeat as well as rapid.  This continued intermittently throughout the night so she came to the ER.  She states she has had intermittent palpitations for the last 2-3 weeks.  She has noted labile BPs at home.  Normal oral intake with no recent changes to her medications.  She may not drink enough fluid by her report.       Imaging:     Independent review of EKG shows NSR, LVH, no ischemic changes    Cardiac cath 12/2013 showed normal coronaries    Echo 04/2013 showed EF of 50-55%, no sig valvular disease    Stress test 02/2007 showed "inferoseptal and apical ischemia", EF 35% with septal dyskinesis with subsequent  cath neg for CAD      Past Medical History:     Past Medical History:   Diagnosis Date   . Chest pain 2016     Chest pain after eating x6  months--being followed by GI for GERD   . Constipation    . Difficulty walking     amb with cane secondary to arthritis bil knees   . Genital herpes     no current outbreak (10/03/15)   . GERD (gastroesophageal reflux disease)    . Hiatal hernia    . Hyperlipidemia    . Hypertensive disorder     Well controlled on med. Denies any SOB in last 6 months (10/03/15)   . Morbid obesity with BMI of 40.0-44.9, adult     BMI 40.9   . Nausea without vomiting    . OSA on CPAP 2015    CPAP nightly x 1 yr   . Osteoarthritis of knees, bilateral    . TIA (transient ischemic attack) 2014    TIA 2014-no residual. Patient evaulated for possible TIA 07/12/2015  @ [facility] (dischage summary in epic)-per summary CT of head was normal and patient was d/c'd       Past Surgical History:     Past Surgical History:   Procedure Laterality Date   . APPENDECTOMY  >25 yrs   .  EGD  01/2015   . fallopian tube surgery  >25 yrs   . INSERTION, PAIN PUMP (MEDICAL) N/A 10/22/2015    Procedure: INSERTION, PAIN PUMP (MEDICAL);  Surgeon: Nicola Police, DO;  Location: [place] MA[facility] OR;  Service: General;  Laterality: N/A;   . LAPAROSCOPIC, CHOLECYSTECTOMY, CHOLANGIOGRAM  08/13/2014   . LAPAROSCOPIC, GASTRIC BYPASS N/A 10/22/2015    Procedure: LAPAROSCOPIC, GASTRIC BYPASS;  Surgeon: Josefa Half R, DO;  Location: [place];  Service: General;  Laterality: N/A;   . LAPAROSCOPIC, HERNIORRHAPHY, HIATAL N/A 10/22/2015    Procedure: LAPAROSCOPIC, HERNIORRHAPHY, HIATAL;  Surgeon: Jeanell Sparrow, Hamid R, DO;  Location:  MAIN OR;  Service: General;  Laterality: N/A;   . OVARY SURGERY Right >25 yrs   . SPINE SURGERY  11/2013    cervical HNP x 2, Dr. Bufford Buttner       Family History:   No premature CAD.  Family History   Problem Relation Age of Onset   . Cancer Mother 75     breast cancer       Social History:      Social History     Social History   . Marital status: Single     Spouse name: N/A   . Number of children: 2   . Years of education: N/A     Occupational History   . legal instrument examiner [employer]      Social History Main Topics   . Smoking status: Never Smoker   . Smokeless tobacco: Never Used   . Alcohol use No   . Drug use: No   . Sexual activity: No     Other Topics Concern   . Dietary Supplements / Vitamins Yes   . Anesthesia Problems No   . Blood Thinners No   . Eats Large Amounts No   . Excessive Sweets No   . Skips Meals No   . Eats Excessive Starches Yes   . Snacks Or Grazes No   . Emotional Eater Yes   . Eats Fried Food Yes   . Eats Fast Food Yes   . Diet Center No   . Hmr No   . [name] No   . La Weight Loss No   . Nutri-System No   . Opti-Fast / Medi-Fast No   . Overeaters Anonymous No   . Physicians Weight Loss Center No   . Tops No   . Weight Watchers No   . Atkins No   . Binging / Purging No   . Body For Life No   . Cabbage Soup No   . Calorie Counting No   . Fasting No   . [name] No   . Health Spa No   . Herbal Life No   . High Protein No   . Low Carb No   . Low Fat No   . [facility] Diet No   . [place] Diet No   . Margie Billet Diet No   . [place] Diet No   . Slim Fast No   . [place] No   . Sugar Busters No   . Vomiting No   . Zone Diet No   . Stationary Cycle Or Treadmill No   . Gym/Fitness Classes No   . Home Exercise/Video No   . Swimming No   . Team Sports No   . Weight Training No   . Walking Or Running No   . Hospitalization  No   . Hypnosis No   . Physical Therapy No   . Psychological Therapy No   . Residential Program No   . Acutrim No   . Amphetamines No   . Anorex No   . Byetta No   . Dexatrim No   . Didrex No   . Fastin No   . Fen - Phen No   . Ionamin / Adipex No   . Mazanor No   . Meridia No   . Obalan No   . Phendiet No   . Phentrol No   . Phenteramine Yes   . Plegine No   . Pondimin No   . Qsymia No   . Prozac No   . Redux No   . Sanorex No   .  Tenuate No   . Tepanole No   . Wechless No   . Wellbutrin No   . Xenical (Orlistat, Alli) No   . Other Med No   . No Impairment Yes     Chronic Bilat Knee Pain   . Walks With Cane/Crutch Yes   . Requires A Wheelchair No   . Bedridden No   . Are You Currently Being Treated For Depression? No   . Do You Snore? Yes   . Are You Receiving Any Medical Or Psychological Services? No   . Do You Have Or Have You Been Treated For An Eating Disorder? No   . Do You Exercise Regularly? No   . Have You Or Family Member Ever Have Trouble With Anesthesia? No     Social History Narrative   . No narrative on file       Allergies:     Allergies   Allergen Reactions   . Acyclovir Nausea And Vomiting   . Amoxicillin-Pot Clavulanate      Other reaction(s): gi distress   . Aspirin Nausea And Vomiting     Other reaction(s): gi distress  Upsets stomach  Able to tolerate enteric coated aspirin   . Atorvastatin      Palpitation   . Lovastatin Nausea And Vomiting   . Metronidazole Nausea And Vomiting   . Moxifloxacin Nausea And Vomiting     GI symptoms   . Other Nausea And Vomiting   . Rosuvastatin      Palpitation, chest pain, abd pain    . Statins    . Sulfa Antibiotics Nausea And Vomiting     Other reaction(s): gi distress       Medications:    Medications reviewed    Home Medications   Prescriptions Prior to Admission   Medication Sig   . albuterol (PROVENTIL HFA;VENTOLIN HFA) 108 (90 BASE) MCG/ACT inhaler Inhale into the lungs every 4 (four) hours as needed.      Marland Kitchen aspirin 81 MG chewable tablet Take 81 mg by Mouth Once a Day. Indications: STOPPED   . atenolol (TENORMIN) 50 MG tablet TAKE 1 and 1/2 TABLET BY MOUTH DAILY. (Patient taking differently: 75 mg.TAKE 1 and 1/2 TABLET BY MOUTH DAILY.    )   . B Complex Vitamins (VITAMIN-B COMPLEX) Tab Take  by Mouth Once a Day.   . cholecalciferol (VITAMIN D-1000 MAX ST) 1000 units tablet Take 1,000 Units by Mouth Once a Day.   . desonide (DESOWEN) 0.05 % cream Apply topically 2 (two) times daily  as needed.   . diazePAM (VALIUM) 2 MG tablet Take 1 tablet (2 mg total) by mouth every 8 (eight) hours as needed  for Anxiety.   . docusate sodium (COLACE) 100 MG capsule Take 100 mg by mouth daily.       . fluticasone (FLONASE) 50 MCG/ACT nasal spray 2 sprays by Nasal route daily. (Patient taking differently: 2 sprays by Nasal route as needed.   )   . pantoprazole (PROTONIX) 40 MG tablet Take 1 tablet (40 mg total) by mouth daily.   . polyethylene glycol (MIRALAX) packet Take 17 g by mouth daily as needed. (Patient taking differently: 17 g every other day.    )   . valACYclovir HCL (VALTREX) 500 MG tablet TAKE 1 TABLET(500 MG) BY MOUTH DAILY   . vitamin A 16109 UNIT capsule Take 10,000 Units by Mouth Once a Day.              Current Medications   Current Facility-Administered Medications   Medication Dose Route Frequency   . aspirin  81 mg Oral Daily   . enoxaparin  40 mg Subcutaneous Daily   . famotidine  20 mg Intravenous Q12H [facility]                            Review of Systems:   All other systems reviewed and are negative except as stated above in the HPI.     Physical Exam:     Vitals:    01/02/17 0800   BP: 140/77   Pulse:    Resp: 18   Temp: 97.5 F (36.4 C)   SpO2: 100%     Temp (24hrs), Avg:97.6 F (36.4 C), Min:96.6 F (35.9 C), Max:98 F (36.7 C)      Intake and Output Summary (Last 24 hours) at Date Time  No intake or output data in the 24 hours ending 01/02/17 0848  Wt Readings from Last 3 Encounters:   01/02/17 59 kg (130 lb)   11/12/16 61.2 kg (135 lb)   11/03/16 62.1 kg (136 lb 12.8 oz)        Vital signs reviewed    GENERAL: Patient is in no acute distress   HEENT: Non-traumatic, no scleral icterus or conjunctival pallor, moist mucous membranes   NECK: No jugular venous distention, normal carotid upstrokes without bruits   CARDIAC: Regular rate and rhythm, with normal S1 and S2, and no rubs, or gallops.  No murmurs  VASCULAR: 2+ carotid, radial, and distal pulses bilaterally  LUNGS: Clear to  auscultation bilaterally, symmetric chest expansion, no ronchi or wheezes, normal respiratory effort  ABDOMEN: No abdominal bruits, nontender, no rebound or guarding, non-distended, good bowel sounds   EXTREMITIES: No clubbing, cyanosis.  No evidence of arthritis.  No LE edema  SKIN: No rash or jaundice   NEUROLOGIC: Alert and oriented to time, place and person   PSYCH:  Normal mood and affect   MUSCULOSKELETAL: Grossly normal without significant defects.      Labs Reviewed:     CBC w/Diff     Recent Labs  Lab 01/02/17  0108   WBC 6.67   Hgb 14.3   Hematocrit 42.2   Platelets 269          Basic Metabolic Profile     Recent Labs  Lab 01/02/17  0626 01/02/17  0108   Sodium 143 143   Potassium 3.6 3.4*   Chloride 108 105   CO2 26 24   BUN 19 17.0   Creatinine 0.7 0.8   EGFR >60.0 >60.0   Glucose 75 85  Calcium 9.5 10.2            Cardiac Enzymes     Recent Labs  Lab 01/02/17  0546 01/02/17  0108   Troponin I <0.01 <0.01          Thyroid Studies         Invalid input(s): FREET4       Cholesterol Panel            Coagulation Studies

## 2017-01-02 NOTE — ED Notes (Signed)
Pt transferred to the ambulance stretcher awake alert still having pain as verbalized

## 2017-01-02 NOTE — UM Notes (Signed)
66 y.o. female presents with 2 day sensation of "pounding" pain in her chest    pmhx  Chest Pain  Genital Herpes  HTN disorder  Hiatal Hernia  OSA on CPAP  TIA      VS:  B/P   182/36M P  83, R  20,  97/7  (o), PAIN 10/10, SPO2  99% RA      Labs:  Potassium 3.4 (L)     Troponin I <0.01     Imaging:  Chest X-RAY-  No acute process.    Meds:  LORazepam (ATIVAN) injection 1 mg  ketorolac (TORADOL) injection 15 mg  alum & mag hydroxide-simethicone (MAALOX PLUS) 200-200-20 mg/5 mL suspension 30 mL  lidocaine viscous (XYLOCAINE) 2 % mouth solution 10 mL    PLACE IN OBSERVATION STATUS  01/01/17  @0156     PLAN:   MEDICINE CONSULT/ASSESSMENT-  "  Chest pain in adult     66 year old female with hx of hypertension, GERD, and hyperlipidemia brought in because of chest pain   Admit to [facility] Hospitalist service  Chest pain R/O acs   - aspirin   - serial troponin  - EKG   - keep npo  - cardiology consult   Hypertension   - hold BB  Hyperlipidemia   GERD   - Pepcid bid "    DAY 2  01/02/17    NO ACUTE SYMPTOMS REPORTED    VS:  97.5  (o), B/P  140/77, P  75,  R 18,  PAIN  1/10,  SPO2  100% RA    LABS:  Troponin I <0.01         DIET;  CARDIAC    MEDS:  famotidine (PEPCID) injection 20 mg   enoxaparin (LOVENOX) syringe 40 mg   albuterol (PROVENTIL) nebulizer solution 2.5 mg       PLAN:  MEDICINE FOLLOW-UP-  "Chest pain r/o acs  Patient rule out, ok to be discharged as per cardiology    Hypertension  To continue atenolol    GERD  Continue pantoprazole as an outpatient    Case discussed with: staff and patient"

## 2017-01-02 NOTE — Discharge Instr - AVS First Page (Signed)
Reason for your Hospital Admission:  Chest pain      Instructions for after your discharge:  Make appointment with your primary care physician with in 1 - 2 weeks  Please see cardiologist in 1 month

## 2017-01-02 NOTE — ED Notes (Signed)
Pt stated that the pain came back DR.Homeyer aware

## 2017-01-02 NOTE — ED Triage Notes (Signed)
Pt stated that she been havingn palpitation x 3 days and tonight woke up with chest pain and shortness of breath.Yesterday pain radiates to the left arm but not today

## 2017-01-02 NOTE — ED Provider Notes (Signed)
EMERGENCY DEPARTMENT NOTE    Physician/Midlevel provider first contact with patient: 01/02/17 0052         HISTORY OF PRESENT ILLNESS   Historian:Patient  Translator Used: No    Chief Complaint: Chest Pain    66 y.o. female presents with 2 day sensation of "pounding" pain in her chest.  Patient notes some SOB with this.  No LE swelling or pain.  Dry cough.  No fever/chills. No N/V/D.  No abd pain.  CP does not worsen with exertion.      1. Location of symptoms: chest  2. Onset of symptoms: 2 days PTA  3. What was patient doing when symptoms started (Context): see above  4. Severity: moderate  5. Timing: constant  6. Activities that worsen symptoms: none  7. Activities that improve symptoms: none  8. Quality: pounding  9. Radiation of symptoms: to left arm  10. Associated signs and Symptoms: see above  11. Are symptoms worsening? yes  MEDICAL HISTORY     Past Medical History:  Past Medical History:   Diagnosis Date   . Chest pain 2016     Chest pain after eating x6  months--being followed by GI for GERD   . Constipation    . Difficulty walking     amb with cane secondary to arthritis bil knees   . Genital herpes     no current outbreak (10/03/15)   . GERD (gastroesophageal reflux disease)    . Hiatal hernia    . Hyperlipidemia    . Hypertensive disorder     Well controlled on med. Denies any SOB in last 6 months (10/03/15)   . Morbid obesity with BMI of 40.0-44.9, adult     BMI 40.9   . Nausea without vomiting    . OSA on CPAP 2015    CPAP nightly x 1 yr   . Osteoarthritis of knees, bilateral    . TIA (transient ischemic attack) 2014    TIA 2014-no residual. Patient evaulated for possible TIA 07/12/2015  @ [facility] (dischage summary in epic)-per summary CT of head was normal and patient was d/c'd       Past Surgical History:  Past Surgical History:   Procedure Laterality Date   . APPENDECTOMY  >25 yrs   . EGD  01/2015   . fallopian tube surgery  >25 yrs   . INSERTION, PAIN PUMP (MEDICAL) N/A 10/22/2015    Procedure:  INSERTION, PAIN PUMP (MEDICAL);  Surgeon: Nicola Police, DO;  Location: [facility] MAIN OR;  Service: General;  Laterality: N/A;   . LAPAROSCOPIC, CHOLECYSTECTOMY, CHOLANGIOGRAM  08/13/2014   . LAPAROSCOPIC, GASTRIC BYPASS N/A 10/22/2015    Procedure: LAPAROSCOPIC, GASTRIC BYPASS;  Surgeon: Josefa Half R, DO;  Location: [facility] OR;  Service: General;  Laterality: N/A;   . LAPAROSCOPIC, HERNIORRHAPHY, HIATAL N/A 10/22/2015    Procedure: LAPAROSCOPIC, HERNIORRHAPHY, HIATAL;  Surgeon: Jeanell Sparrow, Hamid R, DO;  Location: [facility] OR;  Service: General;  Laterality: N/A;   . OVARY SURGERY Right >25 yrs   . SPINE SURGERY  11/2013    cervical HNP x 2, Dr. Bufford Buttner       Social History:  Social History     Social History   . Marital status: Single     Spouse name: N/A   . Number of children: 2   . Years of education: N/A     Occupational History   . legal instrument examiner [employer]  Social History Main Topics   . Smoking status: Never Smoker   . Smokeless tobacco: Never Used   . Alcohol use No   . Drug use: No   . Sexual activity: No     Other Topics Concern   . Dietary Supplements / Vitamins Yes   . Anesthesia Problems No   . Blood Thinners No   . Eats Large Amounts No   . Excessive Sweets No   . Skips Meals No   . Eats Excessive Starches Yes   . Snacks Or Grazes No   . Emotional Eater Yes   . Eats Fried Food Yes   . Eats Fast Food Yes   . Diet Center No   . Hmr No   . [name] No   . La Weight Loss No   . Nutri-System No   . Opti-Fast / Medi-Fast No   . Overeaters Anonymous No   . Physicians Weight Loss Center No   . Tops No   . Weight Watchers No   . Atkins No   . Binging / Purging No   . Body For Life No   . Cabbage Soup No   . Calorie Counting No   . Fasting No   . [name] No   . Health Spa No   . Herbal Life No   . High Protein No   . Low Carb No   . Low Fat No   . Mayo [facility] Diet No   . [place] Diet No   . Margie Billet Diet No   . [place] Diet No   . Slim Fast No    . [place] No   . Sugar Busters No   . Vomiting No   . Zone Diet No   . Stationary Cycle Or Treadmill No   . Gym/Fitness Classes No   . Home Exercise/Video No   . Swimming No   . Team Sports No   . Weight Training No   . Walking Or Running No   . Hospitalization No   . Hypnosis No   . Physical Therapy No   . Psychological Therapy No   . Residential Program No   . Acutrim No   . Amphetamines No   . Anorex No   . Byetta No   . Dexatrim No   . Didrex No   . Fastin No   . Fen - Phen No   . Ionamin / Adipex No   . Mazanor No   . Meridia No   . Obalan No   . Phendiet No   . Phentrol No   . Phenteramine Yes   . Plegine No   . Pondimin No   . Qsymia No   . Prozac No   . Redux No   . Sanorex No   . Tenuate No   . Tepanole No   . Wechless No   . Wellbutrin No   . Xenical (Orlistat, Alli) No   . Other Med No   . No Impairment Yes     Chronic Bilat Knee Pain   . Walks With Cane/Crutch Yes   . Requires A Wheelchair No   . Bedridden No   . Are You Currently Being Treated For Depression? No   . Do You Snore? Yes   . Are You Receiving Any Medical Or Psychological Services? No   . Do You Have Or Have You Been Treated For An Eating Disorder? No   .  Do You Exercise Regularly? No   . Have You Or Family Member Ever Have Trouble With Anesthesia? No     Social History Narrative   . No narrative on file       Family History:  Family History   Problem Relation Age of Onset   . Cancer Mother 37     breast cancer       Outpatient Medication:  Previous Medications    ALBUTEROL (PROVENTIL HFA;VENTOLIN HFA) 108 (90 BASE) MCG/ACT INHALER    Inhale into the lungs every 4 (four) hours as needed.       ASPIRIN 81 MG CHEWABLE TABLET    Take 81 mg by Mouth Once a Day. Indications: STOPPED    ATENOLOL (TENORMIN) 50 MG TABLET    TAKE 1 and 1/2 TABLET BY MOUTH DAILY.    B COMPLEX VITAMINS (VITAMIN-B COMPLEX) TAB    Take  by Mouth Once a Day.    CHOLECALCIFEROL (VITAMIN D-1000 MAX ST) 1000 UNITS TABLET    Take 1,000 Units by Mouth Once a Day.     DESONIDE (DESOWEN) 0.05 % CREAM    Apply topically 2 (two) times daily as needed.    DIAZEPAM (VALIUM) 2 MG TABLET    Take 1 tablet (2 mg total) by mouth every 8 (eight) hours as needed for Anxiety.    DOCUSATE SODIUM (COLACE) 100 MG CAPSULE    Take 100 mg by mouth daily.        FLUTICASONE (FLONASE) 50 MCG/ACT NASAL SPRAY    2 sprays by Nasal route daily.    PANTOPRAZOLE (PROTONIX) 40 MG TABLET    Take 1 tablet (40 mg total) by mouth daily.    POLYETHYLENE GLYCOL (MIRALAX) PACKET    Take 17 g by mouth daily as needed.    VALACYCLOVIR HCL (VALTREX) 500 MG TABLET    TAKE 1 TABLET(500 MG) BY MOUTH DAILY    VITAMIN A 16109 UNIT CAPSULE    Take 10,000 Units by Mouth Once a Day.         REVIEW OF SYSTEMS   Review of Systems   Constitutional: Negative for chills and fever.   HENT: Negative for sinus pain and sore throat.    Respiratory: Positive for cough and shortness of breath. Negative for hemoptysis, sputum production and wheezing.    Cardiovascular: Positive for chest pain and palpitations. Negative for orthopnea, claudication, leg swelling and PND.   Gastrointestinal: Negative for abdominal pain, diarrhea, nausea and vomiting.   Genitourinary: Negative for dysuria.   Musculoskeletal: Negative for back pain and neck pain.   Neurological: Negative for focal weakness.   All other systems reviewed and are negative.    PHYSICAL EXAM     ED Triage Vitals [01/02/17 0048]   Enc Vitals Group      BP 182/83      Heart Rate 83      Resp Rate 20      Temp 97.7 F (36.5 C)      Temp Source Oral      SpO2 100 %      Weight 59 kg      Height 1.549 m      Head Circumference       Peak Flow       Pain Score 10      Pain Loc       Pain Edu?       Excl. in GC?    Nursing note and vitals reviewed.  Constitutional:  Well developed, well nourished.  Awake & Oriented x3  Head:  Atraumatic.  Normocephalic.    Eyes:  Conjunctivae are not pale.  ENT:  Mucous membranes are moist and intact.  Oropharynx is clear and symmetric.  Patent  airway.  Neck:  Supple.  No JVD.   Cardiovascular:  Regular rate.  Regular rhythm.  No murmurs.  Pulmonary/Chest:  No evidence of respiratory distress.  Clear to auscultation bilaterally.  No wheezing, rales or rhonchi.   Abdominal:  Soft and non-distended.  There is no tenderness.  No rebound, guarding, or rigidity.  No pulsatile masses.  Back:  No swelling or deformity.  Extremities:  No edema.   No cyanosis.  No clubbing.  No calf tenderness. B/l LE symmetric.  2+ radial/DP/PT pulses b/l.  Skin:  Skin is warm and dry.  No diaphoresis. No rash.   Neurological: Normal speech.  Moves all extremities.  Psychiatric:  Good eye contact.  Normal interaction, affect, and behavior    MEDICAL DECISION MAKING     DISCUSSION      ED Course    CP with SOB.  Troponin and EKG negative X 1.  Doubt PE and d-dimer negative.  Doubt aortic etiology.  Offered outpatient eval but patient does not feel comfortable with this.  Will admit for further eval.  Patient refused aspirin.  Dr. Marquis Lunch accepts to [facility] service at [facility] for further eval.  Patient stable for transfer and agrees to transfer via PTS.  Heart Score    Flowsheet Row Value   History  0   EKG  1   Risk Factors  2   Total (with age)  4   Onset of pain (time of START of last episode of chest pain)?  >6 hrs ago   Timing of repeat Troponin/EKG order  HEART Score exceeds limit for Chest Pain Pathway        Vital Signs: Reviewed the patient?s vital signs.   Nursing Notes: Reviewed and utilized available nursing notes.  Medical Records Reviewed: Reviewed available past medical records.  Counseling: The emergency provider has spoken with the patient and discussed today?s findings, in addition to providing specific details for the plan of care.  Questions are answered and there is agreement with the plan.      CARDIAC STUDIES    The following cardiac studies were independently interpreted by the Emergency Medicine Physician.  For full cardiac study results please see  chart.    Monitor Strip  Interpreted by ED Physician  Rate: 80  Rhythm: NSR   ST Changes: none    EKG Interpretation:  Signed and interpreted by ED Provider   Time Interpreted: 0054  Rate: 79  Rhythm: NSR  Axis: normal  Intervals: normal  Blocks: none  ST segments: no acute changes  Interpretation: Nonspecific  EKG    EMERGENCY IMAGING STUDIES    The following imagine studies were independently interpreted by me (emergency physician):    Radiology:  Interpreted by me (ED Physician)  Study: Chest Xray   Results: No infiltrate. No pneumothorax. No hemothorax. No cardiomegaly. No CHF.  Impression: No acute intrathoracic abnormality.    RADIOLOGY IMAGING STUDIES      Chest 2 Views   Final Result      No acute process.      Adaline Sill, MD    01/02/2017 1:19 AM            PULSE OXIMETRY    Oxygen Saturation by  Pulse Oximetry: 100%  Interventions: none  Interpretation:  normal    EMERGENCY DEPT. MEDICATIONS      ED Medication Orders     Start Ordered     Status Ordering Provider    01/02/17 0122 01/02/17 0121  ketorolac (TORADOL) injection 15 mg  Once     Route: Intravenous  Ordered Dose: 15 mg     Last MAR action:  Given Edgardo Petrenko D    01/02/17 0122 01/02/17 0121  LORazepam (ATIVAN) injection 1 mg  Once     Route: Intravenous  Ordered Dose: 1 mg     Last MAR action:  Given Aiven Kampe D    01/02/17 0121 01/02/17 0120  lidocaine viscous (XYLOCAINE) 2 % mouth solution 10 mL  Once     Route: Mouth/Throat  Ordered Dose: 10 mL     Last MAR action:  Given Barb Shear D    01/02/17 0121 01/02/17 0120  alum & mag hydroxide-simethicone (MAALOX PLUS) 200-200-20 mg/5 mL suspension 30 mL  Once     Route: Oral  Ordered Dose: 30 mL     Last MAR action:  Given Margaret Staggs D          LABORATORY RESULTS    Ordered and independently interpreted AVAILABLE laboratory tests. Please see results section in chart for full details.  Results for orders placed or performed during the hospital encounter of 01/02/17    Comprehensive metabolic panel   Result Value Ref Range    Glucose 85 70 - 100 mg/dL    BUN 95.2 7.0 - 84.1 mg/dL    Creatinine 0.8 0.6 - 1.0 mg/dL    Sodium 324 401 - 027 mEq/L    Potassium 3.4 (L) 3.5 - 5.1 mEq/L    Chloride 105 100 - 111 mEq/L    CO2 24 22 - 29 mEq/L    Calcium 10.2 8.5 - 10.5 mg/dL    Protein, Total 7.8 6.0 - 8.3 g/dL    Albumin 4.6 3.5 - 5.0 g/dL    AST (SGOT) 21 5 - 34 U/L    ALT 12 0 - 55 U/L    Alkaline Phosphatase 102 37 - 106 U/L    Bilirubin, Total 0.5 0.2 - 1.2 mg/dL    Globulin 3.2 2.0 - 3.6 g/dL    Albumin/Globulin Ratio 1.4 0.9 - 2.2    Anion Gap 14.0 5.0 - 15.0   CBC with differential   Result Value Ref Range    WBC 6.67 3.50 - 10.80 x10 3/uL    Hgb 14.3 12.0 - 16.0 g/dL    Hematocrit 25.3 66.4 - 47.0 %    Platelets 269 140 - 400 x10 3/uL    RBC 5.01 4.20 - 5.40 x10 6/uL    MCV 84.2 80.0 - 100.0 fL    MCH 28.5 28.0 - 32.0 pg    MCHC 33.9 32.0 - 36.0 g/dL    RDW 14 12 - 15 %    MPV 10.2 9.4 - 12.3 fL    Neutrophils 51.8 None %    Lymphocytes Automated 37.6 None %    Monocytes 8.1 None %    Eosinophils Automated 1.8 None %    Basophils Automated 0.6 None %    Immature Granulocyte 0.1 None %    Nucleated RBC 0.0 0.0 - 1.0 /100 WBC    Neutrophils Absolute 3.45 1.80 - 8.10 x10 3/uL    Abs Lymph Automated 2.51 0.50 - 4.40 x10 3/uL  Abs Mono Automated 0.54 0.00 - 1.20 x10 3/uL    Abs Eos Automated 0.12 0.00 - 0.70 x10 3/uL    Absolute Baso Automated 0.04 0.00 - 0.20 x10 3/uL    Absolute Immature Granulocyte 0.01 0 x10 3/uL    Absolute NRBC 0.00 0 x10 3/uL   Troponin I   Result Value Ref Range    Troponin I <0.01 0.00 - 0.09 ng/mL   GFR   Result Value Ref Range    EGFR >60.0    D-Dimer   Result Value Ref Range    D-Dimer 0.33 0.00 - 0.70 ug/mL FEU       CRITICAL CARE/PROCEDURES    Procedures    DIAGNOSIS      Diagnosis:  Final diagnoses:   Chest pain in adult       Disposition:  ED Disposition     ED Disposition Condition Date/Time Comment    Observation  Sat Jan 02, 2017  1:56 AM  Admitting Physician: Arna Medici [[id number]]   Diagnosis: Chest pain in adult [[id number]]   Estimated Length of Stay: < 2 midnights   Tentative Discharge Plan?: Home or Self Care [1]   Patient Class: Observation [104]            Prescriptions:  Patient's Medications   New Prescriptions    No medications on file   Previous Medications    ALBUTEROL (PROVENTIL HFA;VENTOLIN HFA) 108 (90 BASE) MCG/ACT INHALER    Inhale into the lungs every 4 (four) hours as needed.       ASPIRIN 81 MG CHEWABLE TABLET    Take 81 mg by Mouth Once a Day. Indications: STOPPED    ATENOLOL (TENORMIN) 50 MG TABLET    TAKE 1 and 1/2 TABLET BY MOUTH DAILY.    B COMPLEX VITAMINS (VITAMIN-B COMPLEX) TAB    Take  by Mouth Once a Day.    CHOLECALCIFEROL (VITAMIN D-1000 MAX ST) 1000 UNITS TABLET    Take 1,000 Units by Mouth Once a Day.    DESONIDE (DESOWEN) 0.05 % CREAM    Apply topically 2 (two) times daily as needed.    DIAZEPAM (VALIUM) 2 MG TABLET    Take 1 tablet (2 mg total) by mouth every 8 (eight) hours as needed for Anxiety.    DOCUSATE SODIUM (COLACE) 100 MG CAPSULE    Take 100 mg by mouth daily.        FLUTICASONE (FLONASE) 50 MCG/ACT NASAL SPRAY    2 sprays by Nasal route daily.    PANTOPRAZOLE (PROTONIX) 40 MG TABLET    Take 1 tablet (40 mg total) by mouth daily.    POLYETHYLENE GLYCOL (MIRALAX) PACKET    Take 17 g by mouth daily as needed.    VALACYCLOVIR HCL (VALTREX) 500 MG TABLET    TAKE 1 TABLET(500 MG) BY MOUTH DAILY    VITAMIN A 81191 UNIT CAPSULE    Take 10,000 Units by Mouth Once a Day.   Modified Medications    No medications on file   Discontinued Medications    No medications on file        Shela Nevin, MD  01/02/17 [phone]

## 2017-01-02 NOTE — Progress Notes (Signed)
Patient transferred from [facility], dx Chest Pain. Patient denies chest discomfort on arrival, ambulating with assistance to the bathroom; no sob noted. Patient is NPO at this time for possible cardiac workup, patient made aware of treatment plan.

## 2017-01-02 NOTE — Discharge Summary (Signed)
Discharge Summary    Date:01/02/2017   Patient Name: Amber [name]  Attending Physician: Randell Patient, *    Date of Admission:   01/02/2017    Date of Discharge:   01/02/17    Admitting Diagnosis:   Chest pain    Discharge Dx:     Principal Diagnosis (Diagnosis after study, that is chiefly responsible for admission to inpatient status): Chest pain in adult  Active Hospital Problems    Diagnosis POA   No active problems to display.      Resolved Hospital Problems    Diagnosis POA   . Principal Problem: Chest pain in adult Yes       Treatment Team:   Treatment Team:   Attending Provider: Randell Patient, MD  Consulting Physician: Roseanna Rainbow, MD  Consulting Physician: Marni Griffon, MD  Consulting Physician: Holli Humbles, Blossom Hoops, MD     Procedures performed:   Radiology: all results from this admission  Chest 2 Views    Result Date: 01/02/2017  No acute process. Adaline Sill, MD 01/02/2017 1:19 AM      Reason for Admission:   Chest pain  Hospital Course:      66 y.o. female with hx of hypertension, GERD, and hyperlipidemia brought in because of chest pain, sharp, mid chest  Associated with palpitations and sob. No cough or flu like symptoms. No fever or chills. . During hospitalization patient troponin's were normal, cardiology evaluated the patient because of prior extensive cardiac work ups they did not feel that she required further evaluation at this time. It was decided to discharge patient home.    Condition at Discharge:   Fair  Today:     BP 121/81   Pulse 75   Temp 97.1 F (36.2 C) (Oral)   Resp 18   Ht 1.549 m (5\' 1" )   Wt 59 kg (130 lb)   SpO2 99%   BMI 24.56 kg/m   Ranges for the last 24 hours:  Temp:  [96.6 F (35.9 C)-98 F (36.7 C)] 97.1 F (36.2 C)  Heart Rate:  [69-83] 75  Resp Rate:  [13-22] 18  BP: (121-182)/(69-83) 121/81    Last set of labs     Recent Labs  Lab 01/02/17  0108   WBC 6.67   Hgb 14.3   Hematocrit 42.2   Platelets 269       Recent Labs  Lab  01/02/17  0626   Sodium 143   Potassium 3.6   Chloride 108   CO2 26   BUN 19   Creatinine 0.7   EGFR >60.0   Glucose 75   Calcium 9.5       Recent Labs  Lab 01/02/17  0108   Bilirubin, Total 0.5   Protein, Total 7.8   Albumin 4.6   ALT 12   AST (SGOT) 21               Invalid input(s): FREET4        Recent Labs  Lab 01/02/17  0902 01/02/17  0546 01/02/17  0108   Troponin I <0.01 <0.01 <0.01       Micro / Labs / Path pending:     Unresulted Labs     None          Discharge Instructions For Providers     1. Follow up with primary care physician in 1 - 2 weeks  2. Follow up with cardiologist with in 1 month  Stroke Measures:  Reasons(s) for not prescribing a statin medication at discharge: LDL less than 100 for stroke or with 1st 24 Hr after MI      Discharge Instructions:     Follow-up Information     Bui, Flonnie Overman, MD Follow up in 1 week(s).    Specialty:  Internal Medicine  Contact information:  [address]  [place] [address]  [phone]             Darnelle Maffucci, MD Follow up in 1 month(s).    Specialties:  Cardiology, Interventional Cardiology, Internal Medicine  Contact information:  [address]  [place] [address]  [phone]                   Discharge Diet: Cardiac Diet           Disposition:  Home or Self Care     Discharge Medication List      Taking    albuterol 108 (90 Base) MCG/ACT inhaler  Commonly known as:  PROVENTIL HFA;VENTOLIN HFA  Inhale into the lungs every 4 (four) hours as needed.     aspirin 81 MG chewable tablet  Take 81 mg by Mouth Once a Day. Indications: STOPPED     atenolol 50 MG tablet  What changed:   how much to take   additional instructions  Commonly known as:  TENORMIN  TAKE 1 and 1/2 TABLET BY MOUTH DAILY.     desonide 0.05 % cream  Commonly known as:  DESOWEN  Apply topically 2 (two) times daily as needed.     diazePAM 2 MG tablet  Dose:  2 mg  Commonly known as:  VALIUM  Take 1 tablet (2 mg total) by mouth every 8 (eight) hours as needed for  Anxiety.     docusate sodium 100 MG capsule  Dose:  100 mg  Commonly known as:  COLACE  Take 100 mg by mouth daily.     fluticasone 50 MCG/ACT nasal spray  Dose:  2 spray  What changed:   when to take this   reasons to take this  Commonly known as:  FLONASE  2 sprays by Nasal route daily.     pantoprazole 40 MG tablet  Dose:  40 mg  Commonly known as:  PROTONIX  Take 1 tablet (40 mg total) by mouth daily.     polyethylene glycol packet  Dose:  17 g  What changed:   how to take this   when to take this  Commonly known as:  MIRALAX  Take 17 g by mouth daily as needed.     valACYclovir HCL 500 MG tablet  Commonly known as:  VALTREX  TAKE 1 TABLET(500 MG) BY MOUTH DAILY     vitamin A 28413 UNIT capsule  Take 10,000 Units by Mouth Once a Day.     VITAMIN D-1000 MAX ST 1000 units tablet  Generic drug:  cholecalciferol  Take 1,000 Units by Mouth Once a Day.     Vitamin-B Complex Tabs  Take  by Mouth Once a Day.          Minutes spent coordinating discharge and reviewing discharge plan: 45 minutes      Signed by: Randell Patient, MD

## 2017-01-02 NOTE — Plan of Care (Signed)
Pt received discharge orders post assessment from cardiology and medical doctor, Sinus on tele..Alert and oriented x 4, denies pain at present.Encouraged to eat, tolerated well.Discharge instructions provided of which patient voiced understanding. Escorted to lobby stable.

## 2017-01-02 NOTE — H&P (Addendum)
ADMISSION HISTORY AND PHYSICAL EXAM    [facility], DIVISION OF HOSPITALIST MEDICINE   Amber [name]   Amber [name]: [id number]      Date Time: 01/02/17 6:14 AM  Patient Name: Amber [name] F  Attending Physician: Arna Medici, MD  Primary Care Physician: Oneita Jolly, MD    CC: chest pain     Assessment:     Active Hospital Problems    Diagnosis   . Chest pain in adult       66 year old female with hx of hypertension, GERD, and hyperlipidemia brought in because of chest pain     Plan:   -Admit to [facility] Hospitalist service  Chest pain R/O acs   - aspirin   - serial troponin  - EKG   - keep npo  - cardiology consult   Hypertension   - hold BB  Hyperlipidemia   GERD   - Pepcid bid       Disposition:     Anticipated medical stability for discharge: 1-2 DAYS   Service status: Observation:patient is stable and improving.    History of Presenting Illness:   PATRISIA [name] is a 66 y.o. female with hx of hypertension, GERD, and hyperlipidemia brought in because of chest pain, sharp, mid chest  Associated with palpitations and sob. No cough or flu like symptoms. No fever or chills. She said she had stress test in 2016 which is normal.     Past Medical History:     Past Medical History:   Diagnosis Date   . Chest pain 2016     Chest pain after eating x6  months--being followed by GI for GERD   . Constipation    . Difficulty walking     amb with cane secondary to arthritis bil knees   . Genital herpes     no current outbreak (10/03/15)   . GERD (gastroesophageal reflux disease)    . Hiatal hernia    . Hyperlipidemia    . Hypertensive disorder     Well controlled on med. Denies any SOB in last 6 months (10/03/15)   . Morbid obesity with BMI of 40.0-44.9, adult     BMI 40.9   . Nausea without vomiting    . OSA on CPAP 2015    CPAP nightly x 1 yr   . Osteoarthritis of knees, bilateral    . TIA (transient ischemic attack) 2014    TIA 2014-no residual. Patient evaulated for possible TIA 07/12/2015  @ [facility]  (dischage summary in epic)-per summary CT of head was normal and patient was d/c'd       Available old records reviewed, including: EPIC     Past Surgical History:     Past Surgical History:   Procedure Laterality Date   . APPENDECTOMY  >25 yrs   . EGD  01/2015   . fallopian tube surgery  >25 yrs   . INSERTION, PAIN PUMP (MEDICAL) N/A 10/22/2015    Procedure: INSERTION, PAIN PUMP (MEDICAL);  Surgeon: Nicola Police, DO;  Location: [place] [facility];  Service: General;  Laterality: N/A;   . LAPAROSCOPIC, CHOLECYSTECTOMY, CHOLANGIOGRAM  08/13/2014   . LAPAROSCOPIC, GASTRIC BYPASS N/A 10/22/2015    Procedure: LAPAROSCOPIC, GASTRIC BYPASS;  Surgeon: Josefa Half R, DO;  Location: [place] [facility];  Service: General;  Laterality: N/A;   . LAPAROSCOPIC, HERNIORRHAPHY, HIATAL N/A 10/22/2015    Procedure: LAPAROSCOPIC, HERNIORRHAPHY, HIATAL;  Surgeon: Josefa Half R, DO;  Location: [place] [facility];  Service: General;  Laterality: N/A;   . OVARY SURGERY Right >25 yrs   . SPINE SURGERY  11/2013    cervical HNP x 2, Dr. Bufford Buttner       Family History:     Family History   Problem Relation Age of Onset   . Cancer Mother 66     breast cancer       Social History:     History   Smoking Status   . Never Smoker   Smokeless Tobacco   . Never Used     History   Alcohol Use No     History   Drug Use No       Allergies:     Allergies   Allergen Reactions   . Acyclovir Nausea And Vomiting   . Amoxicillin-Pot Clavulanate      Other reaction(s): gi distress   . Aspirin Nausea And Vomiting     Other reaction(s): gi distress  Upsets stomach  Able to tolerate enteric coated aspirin   . Atorvastatin      Palpitation   . Lovastatin Nausea And Vomiting   . Metronidazole Nausea And Vomiting   . Moxifloxacin Nausea And Vomiting     GI symptoms   . Other Nausea And Vomiting   . Rosuvastatin      Palpitation, chest pain, abd pain    . Statins    . Sulfa Antibiotics Nausea And Vomiting     Other reaction(s): gi distress        Medications:     Home Medications             albuterol (PROVENTIL HFA;VENTOLIN HFA) 108 (90 BASE) MCG/ACT inhaler     Inhale into the lungs every 4 (four) hours as needed.        aspirin 81 MG chewable tablet     Take 81 mg by Mouth Once a Day. Indications: STOPPED     atenolol (TENORMIN) 50 MG tablet     TAKE 1 and 1/2 TABLET BY MOUTH DAILY.     Patient taking differently:  75 mg.TAKE 1 and 1/2 TABLET BY MOUTH DAILY.         B Complex Vitamins (VITAMIN-B COMPLEX) Tab     Take  by Mouth Once a Day.     cholecalciferol (VITAMIN D-1000 MAX ST) 1000 units tablet     Take 1,000 Units by Mouth Once a Day.     desonide (DESOWEN) 0.05 % cream     Apply topically 2 (two) times daily as needed.     diazePAM (VALIUM) 2 MG tablet     Take 1 tablet (2 mg total) by mouth every 8 (eight) hours as needed for Anxiety.     docusate sodium (COLACE) 100 MG capsule     Take 100 mg by mouth daily.         fluticasone (FLONASE) 50 MCG/ACT nasal spray     2 sprays by Nasal route daily.     Patient taking differently:  2 sprays by Nasal route as needed.        pantoprazole (PROTONIX) 40 MG tablet     Take 1 tablet (40 mg total) by mouth daily.     polyethylene glycol (MIRALAX) packet     Take 17 g by mouth daily as needed.     Patient taking differently:  17 g every other day.         valACYclovir HCL (  VALTREX) 500 MG tablet     TAKE 1 TABLET(500 MG) BY MOUTH DAILY     vitamin A 16109 UNIT capsule     Take 10,000 Units by Mouth Once a Day.          Method by which medications were confirmed on admission:       Review of Systems:   All other systems were reviewed and are negative except:as above in HPI     Physical Exam:   Patient Vitals for the past 24 hrs:   BP Temp Temp src Pulse Resp SpO2 Height Weight   01/02/17 0452 146/73 (!) 96.6 F (35.9 C) Oral 76 21 96 % - -   01/02/17 0421 147/74 98 F (36.7 C) Oral 73 22 - - -   01/02/17 0330 - - - 69 13 99 % - -   01/02/17 0246 133/69 98 F (36.7 C) Oral 70 18 100 % - -   01/02/17  0130 171/74 - - 69 20 100 % - -   01/02/17 0048 182/83 97.7 F (36.5 C) Oral 83 20 100 % 1.549 m (5\' 1" ) 59 kg (130 lb)     Body mass index is 24.56 kg/m.  No intake or output data in the 24 hours ending 01/02/17 0614    General: awake;  no acute distress.  HEENT: perrla, eomi, sclera anicteric  oropharynx clear without lesions, mucous membranes moist  Neck: supple, no lymphadenopathy, no JVD, no carotid bruits  Cardiovascular: Normal S1 and S2, no murmurs, rubs or gallops  Lungs: clear to auscultation bilaterally, without wheezing, rhonchi, or rales  Abdomen: soft, non-tender, non-distended; no palpable masses, no hepatosplenomegaly, normoactive bowel sounds, no rebound or guarding  Extremities: no clubbing, cyanosis, or edema  Neuro: alert, oriented x 3, cranial nerves grossly intact, strength 5/5 in upper and lower extremities, sensation intact,   Skin: no rashes or lesions noted  Other:       Labs:     Results     Procedure Component Value Units Date/Time    Troponin I [[id number]] Collected:  01/02/17 0546    Specimen:  Blood Updated:  01/02/17 0546    D-Dimer [[id number]] Collected:  01/02/17 0108     Updated:  01/02/17 0137     D-Dimer 0.33 ug/mL FEU     CBC with differential [[id number]] Collected:  01/02/17 0108    Specimen:  Blood from Blood Updated:  01/02/17 0136     WBC 6.67 x10 3/uL      Hgb 14.3 g/dL      Hematocrit 84.6 %      Platelets 269 x10 3/uL      RBC 5.01 x10 6/uL      MCV 84.2 fL      MCH 28.5 pg      MCHC 33.9 g/dL      RDW 14 %      MPV 10.2 fL      Neutrophils 51.8 %      Lymphocytes Automated 37.6 %      Monocytes 8.1 %      Eosinophils Automated 1.8 %      Basophils Automated 0.6 %      Immature Granulocyte 0.1 %      Nucleated RBC 0.0 /100 WBC      Neutrophils Absolute 3.45 x10 3/uL      Abs Lymph Automated 2.51 x10 3/uL      Abs Mono Automated 0.54 x10 3/uL  Abs Eos Automated 0.12 x10 3/uL      Absolute Baso Automated 0.04 x10 3/uL      Absolute Immature Granulocyte 0.01 x10 3/uL       Absolute NRBC 0.00 x10 3/uL     Troponin I [[id number]] Collected:  01/02/17 0108    Specimen:  Blood Updated:  01/02/17 0134     Troponin I <0.01 ng/mL     Comprehensive metabolic panel [[id number]]  (Abnormal) Collected:  01/02/17 0108    Specimen:  Blood Updated:  01/02/17 0133     Glucose 85 mg/dL      BUN 18.8 mg/dL      Creatinine 0.8 mg/dL      Sodium 416 mEq/L      Potassium 3.4 (L) mEq/L      Chloride 105 mEq/L      CO2 24 mEq/L      Calcium 10.2 mg/dL      Protein, Total 7.8 g/dL      Albumin 4.6 g/dL      AST (SGOT) 21 U/L      ALT 12 U/L      Alkaline Phosphatase 102 U/L      Bilirubin, Total 0.5 mg/dL      Globulin 3.2 g/dL      Albumin/Globulin Ratio 1.4     Anion Gap 14.0    GFR [[id number]] Collected:  01/02/17 0108     Updated:  01/02/17 0133     EGFR >60.0        EKG NSR   Imaging personally reviewed, including: all available   Chest 2 Views    Result Date: 01/02/2017  No acute process. Adaline Sill, MD 01/02/2017 1:19 AM      Safety Checklist  DVT prophylaxis:  CHEST guideline (See page e199S) Chemical and Mechanical     This note was generated by the Epic EMR system/ Dragon speech recognition and may contain inherent errors or omissions not intended by the user. Grammatical errors, random word insertions, deletions and pronoun errors  are occasional consequences of this technology due to software limitations. Not all errors are caught or corrected. If there are questions or concerns about the content of this note or information contained within the body of this dictation they should be addressed directly with the author for clarification.    Signed by: Roseanna Rainbow, MD   cc:Bui, Flonnie Overman, MD

## 2017-01-02 NOTE — Progress Notes (Signed)
MEDICINE PROGRESS NOTE    Date Time: 01/02/17 12:30 PM  Patient Name: Amber [name]  Attending Physician: Holli Humbles, Blossom Hoops, *    Assessment:   Active Problems:    Chest pain in adult  Resolved Problems:    * No resolved hospital problems. *      66 year old female patient with PMH of hypertension, GERD and hyperlipidemia that was brought to the hospital with chest pain.    Plan:   Chest pain r/o acs  Patient rule out, ok to be discharged as per cardiology    Hypertension  To continue atenolol    GERD  Continue pantoprazole as an outpatient    Case discussed with: staff and patient    Safety Checklist:     DVT prophylaxis:  CHEST guideline (See page e199S) Chemical and Mechanical   Foley:  [place] R[facility] Foley protocol Not present   IVs:  Peripheral IV   PT/OT: Not needed   Daily CBC & or Chem ordered:  SHM/ABIM guidelines (see #5) No   Reference for approximate charges of common labs: CBC auto diff - $76  BMP - $99  Mg - $79    Lines:     Patient Lines/Drains/Airways Status    Active PICC Line / CVC Line / PIV Line / Drain / Airway / Intraosseous Line / Epidural Line / ART Line / Line / Wound / Pressure Ulcer / NG/OG Tube     Name:   Placement date:   Placement time:   Site:   Days:    Peripheral IV 01/02/17 Left;Anterior Antecubital  01/02/17    0107    Antecubital    less than 1                 Disposition: (Please see PAF column for Expected D/C Date)   Today's date: 01/02/2017  Admit Date: 01/02/2017 12:47 AM  LOS: 0  Clinical Milestones: ruled out  Anticipated discharge needs: none      Subjective     CC: Chest pain in adult    Interval History/24 hour events: ruled out  HPI/Subjective:   66 year old female patient admitted to the hospital for chest pain. Currently reports no problems, feels well.     Review of Systems:     As per HPI    Physical Exam:     VITAL SIGNS PHYSICAL EXAM   Temp:  [96.6 F (35.9 C)-98 F (36.7 C)] 97.1 F (36.2 C)  Heart Rate:  [69-83] 75  Resp Rate:  [13-22] 18  BP:  (121-182)/(69-83) 121/81  Blood Glucose:    Telemetry: NSR    No intake or output data in the 24 hours ending 01/02/17 1230 Physical Exam  General: awake, alert X 3  Cardiovascular: regular rate and rhythm, no murmurs, rubs or gallops  Lungs: clear to auscultation bilaterally, without wheezing, rhonchi, or rales  Abdomen: soft, non-tender, non-distended; no palpable masses,  normoactive bowel sounds  Extremities: no edema       Meds:     Medications were reviewed:  Current Facility-Administered Medications   Medication Dose Route Frequency   . aspirin  81 mg Oral Daily   . enoxaparin  40 mg Subcutaneous Daily   . famotidine  20 mg Intravenous Q12H SCH     Current Facility-Administered Medications   Medication Dose Route Frequency Last Rate     Current Facility-Administered Medications   Medication Dose Route   . albuterol  2.5 mg Nebulization   .  naloxone  0.2 mg Intravenous         Labs:     Labs (last 72 hours):      Recent Labs  Lab 01/02/17  0108   WBC 6.67   Hgb 14.3   Hematocrit 42.2   Platelets 269            Recent Labs  Lab 01/02/17  0626 01/02/17  0108   Sodium 143 143   Potassium 3.6 3.4*   Chloride 108 105   CO2 26 24   BUN 19 17.0   Creatinine 0.7 0.8   Calcium 9.5 10.2   Albumin  --  4.6   Protein, Total  --  7.8   Bilirubin, Total  --  0.5   Alkaline Phosphatase  --  102   ALT  --  12   AST (SGOT)  --  21   Glucose 75 85                   Microbiology, reviewed and are significant for:  Microbiology Results     None          Imaging, reviewed and are significant for:  Chest x ray      Signed by: Randell Patient, MD

## 2017-01-04 ENCOUNTER — Other Ambulatory Visit (HOSPITAL_BASED_OUTPATIENT_CLINIC_OR_DEPARTMENT_OTHER): Payer: Self-pay

## 2017-01-05 ENCOUNTER — Encounter (INDEPENDENT_AMBULATORY_CARE_PROVIDER_SITE_OTHER): Payer: Self-pay | Admitting: Cardiology

## 2017-01-05 ENCOUNTER — Ambulatory Visit (INDEPENDENT_AMBULATORY_CARE_PROVIDER_SITE_OTHER): Payer: Commercial Managed Care - HMO | Admitting: Cardiology

## 2017-01-05 VITALS — BP 128/78 | HR 65 | Resp 16 | Ht 61.0 in | Wt 130.0 lb

## 2017-01-05 DIAGNOSIS — I10 Essential (primary) hypertension: Secondary | ICD-10-CM

## 2017-01-05 DIAGNOSIS — R002 Palpitations: Secondary | ICD-10-CM

## 2017-01-05 DIAGNOSIS — R0789 Other chest pain: Secondary | ICD-10-CM

## 2017-01-05 NOTE — Progress Notes (Signed)
Amber [name] 66 y.o. with a history of HTN presents for cardiac follow up    He major complaint is palpitations and chest pain    Palpitations   - daily   - no syncope    Chest pain   - atypical   - normal CE on recent visit to hospital    HTN   - better controlled at home      Cardiac work up has included       - cardiac cath ( 01/08/14 - normal )  - echocardiogram ( EF 55%, mild TR)        Past Medical History:   Diagnosis Date   . Chest pain 2016     Chest pain after eating x6  months--being followed by GI for GERD   . Constipation    . Difficulty walking     amb with cane secondary to arthritis bil knees   . Genital herpes     no current outbreak (10/03/15)   . GERD (gastroesophageal reflux disease)    . Hiatal hernia    . Hyperlipidemia    . Hypertensive disorder     Well controlled on med. Denies any SOB in last 6 months (10/03/15)   . Morbid obesity with BMI of 40.0-44.9, adult     BMI 40.9   . Nausea without vomiting    . OSA on CPAP 2015    CPAP nightly x 1 yr   . Osteoarthritis of knees, bilateral    . TIA (transient ischemic attack) 2014    TIA 2014-no residual. Patient evaulated for possible TIA 07/12/2015  @ [facility] (dischage summary in epic)-per summary CT of head was normal and patient was d/c'd     Family History   Problem Relation Age of Onset   . Cancer Mother 48     breast cancer     Current Outpatient Prescriptions   Medication Sig Dispense Refill   . albuterol (PROVENTIL HFA;VENTOLIN HFA) 108 (90 BASE) MCG/ACT inhaler Inhale into the lungs every 4 (four) hours as needed.        Marland Kitchen atenolol (TENORMIN) 50 MG tablet TAKE 1 and 1/2 TABLET BY MOUTH DAILY. (Patient taking differently: 75 mg.TAKE 1  TABLET BY MOUTH DAILY.    ) 140 tablet 1   . B Complex Vitamins (VITAMIN-B COMPLEX) Tab Take  by Mouth Once a Day.     . cholecalciferol (VITAMIN D-1000 MAX ST) 1000 units tablet Take 1,000 Units by Mouth Once a Day.     . desonide (DESOWEN) 0.05 % cream Apply topically 2 (two) times daily as  needed. 60 g 2   . diazePAM (VALIUM) 2 MG tablet Take 1 tablet (2 mg total) by mouth every 8 (eight) hours as needed for Anxiety. 30 tablet 2   . docusate sodium (COLACE) 100 MG capsule Take 100 mg by mouth daily.         . fluticasone (FLONASE) 50 MCG/ACT nasal spray 2 sprays by Nasal route daily. (Patient taking differently: 2 sprays by Nasal route as needed.   ) 16 g 2   . pantoprazole (PROTONIX) 40 MG tablet Take 1 tablet (40 mg total) by mouth daily. 90 tablet 1   . polyethylene glycol (MIRALAX) packet Take 17 g by mouth daily as needed. (Patient taking differently: 17 g every other day.    ) 5 packet 0   . valACYclovir HCL (VALTREX) 500 MG tablet TAKE 1 TABLET(500 MG) BY MOUTH DAILY 90 tablet  0   . vitamin A 16109 UNIT capsule Take 10,000 Units by Mouth Once a Day.       No current facility-administered medications for this visit.         PE:    Vitals:    01/05/17 1148   BP: 128/78   Pulse: 65   Resp: 16     Body mass index is 24.56 kg/m.    General:nad  cv rr   Lung cta  abd s  Ext no edema      Labs:  Lipid Panel   Cholesterol   Date/Time Value Ref Range Status   11/09/2016 07:28 AM 169 <200 mg/dL Final     Triglycerides   Date/Time Value Ref Range Status   11/09/2016 07:28 AM 51 <150 mg/dL Final   60/45/4098 11:91 AM 63 34 - 149 mg/dL Final     HDL   Date/Time Value Ref Range Status   11/09/2016 07:28 AM 70 >50 mg/dL Final       CMP:   Sodium   Date/Time Value Ref Range Status   01/02/2017 06:26 AM 143 136 - 145 mEq/L Final     Potassium   Date/Time Value Ref Range Status   01/02/2017 06:26 AM 3.6 3.5 - 5.1 mEq/L Final     Chloride   Date/Time Value Ref Range Status   01/02/2017 06:26 AM 108 100 - 111 mEq/L Final   11/09/2016 07:28 AM 105 98 - 110 mmol/L Final     CO2   Date/Time Value Ref Range Status   01/02/2017 06:26 AM 26 22 - 29 mEq/L Final     Glucose   Date/Time Value Ref Range Status   01/02/2017 06:26 AM 75 70 - 100 mg/dL Final     Comment:     ADA guidelines for diabetes mellitus:  Fasting:   Equal to or greater than 126 mg/dL  Random:   Equal to or greater than 200 mg/dL       BUN   Date/Time Value Ref Range Status   01/02/2017 06:26 AM 19 7 - 19 mg/dL Final     Protein, Total   Date/Time Value Ref Range Status   01/02/2017 01:08 AM 7.8 6.0 - 8.3 g/dL Final   47/82/9562 13:08 AM 6.3 6.1 - 8.1 g/dL Final     Alkaline Phosphatase   Date/Time Value Ref Range Status   01/02/2017 01:08 AM 102 37 - 106 U/L Final     AST (SGOT)   Date/Time Value Ref Range Status   01/02/2017 01:08 AM 21 5 - 34 U/L Final     ALT   Date/Time Value Ref Range Status   01/02/2017 01:08 AM 12 0 - 55 U/L Final     Anion Gap   Date/Time Value Ref Range Status   01/02/2017 06:26 AM 9.0 5.0 - 15.0 Final       CBC:   WBC   Date/Time Value Ref Range Status   01/02/2017 01:08 AM 6.67 3.50 - 10.80 x10 3/uL Final   06/30/2009 04:03 PM 9.12 3.50 - 10.80 /CUMM Final     RBC   Date/Time Value Ref Range Status   01/02/2017 01:08 AM 5.01 4.20 - 5.40 x10 6/uL Final     Hemoglobin   Date/Time Value Ref Range Status   11/09/2016 07:28 AM 12.4 11.7 - 15.5 g/dL Final     Hgb   Date/Time Value Ref Range Status   01/02/2017 01:08 AM 14.3 12.0 - 16.0 g/dL Final  Hematocrit   Date/Time Value Ref Range Status   01/02/2017 01:08 AM 42.2 37.0 - 47.0 % Final     MCV   Date/Time Value Ref Range Status   01/02/2017 01:08 AM 84.2 80.0 - 100.0 fL Final     MCHC   Date/Time Value Ref Range Status   01/02/2017 01:08 AM 33.9 32.0 - 36.0 g/dL Final     RDW   Date/Time Value Ref Range Status   01/02/2017 01:08 AM 14 12 - 15 % Final     Platelets   Date/Time Value Ref Range Status   01/02/2017 01:08 AM 269 140 - 400 x10 3/uL Final   11/09/2016 07:28 AM 219 140 - 400 Thousand/uL Final           Impression / plan    Chest pain   - noncardiac   - normal cardiac cath 2015   - follow up with GI  Palpitations   - check 24 hour holter monitor   - symptoms got more frequent when BB dose was decreased    rtc 2 weeks      Amber [name]  01/05/2017

## 2017-01-07 ENCOUNTER — Ambulatory Visit (INDEPENDENT_AMBULATORY_CARE_PROVIDER_SITE_OTHER): Payer: Commercial Managed Care - HMO | Admitting: Internal Medicine

## 2017-01-07 ENCOUNTER — Encounter (INDEPENDENT_AMBULATORY_CARE_PROVIDER_SITE_OTHER): Payer: Self-pay | Admitting: Internal Medicine

## 2017-01-07 VITALS — BP 168/87 | HR 65 | Temp 98.1°F | Ht 61.0 in | Wt 139.0 lb

## 2017-01-07 DIAGNOSIS — I10 Essential (primary) hypertension: Secondary | ICD-10-CM

## 2017-01-07 DIAGNOSIS — F419 Anxiety disorder, unspecified: Secondary | ICD-10-CM

## 2017-01-07 DIAGNOSIS — R002 Palpitations: Secondary | ICD-10-CM

## 2017-01-07 DIAGNOSIS — Z9884 Bariatric surgery status: Secondary | ICD-10-CM

## 2017-01-07 DIAGNOSIS — K219 Gastro-esophageal reflux disease without esophagitis: Secondary | ICD-10-CM

## 2017-01-07 NOTE — Progress Notes (Signed)
Subjective:       Patient ID: Amber [name] is a 66 y.o. female.    HPI  Pt c/o  1. Was adm at hosp for chest palp, abd pain, CXR, labs, EKG normal,  Saw card Dr. Katrinka Blazing, had Holter, results pending,   Had cardiac cath 12/2013 that was normal  Echo normal EF.    2. HTN atenolol 50mg , home BP 110-120's/70-80's,   Still has palp daily, no cp, +associated dyspnea'  Aspirin on hold due to GI sx.   3. Anxiety, takes valium prn, no change in sx    The following portions of the patient's history were reviewed and updated as appropriate: allergies, current medications, past family history, past medical history, past social history, past surgical history and problem list.    Review of Systems   Constitutional: Negative for appetite change.   Respiratory: Negative for cough and wheezing.    Cardiovascular: Positive for palpitations.   Gastrointestinal: Negative for abdominal pain, constipation, diarrhea, nausea and vomiting.   Genitourinary: Negative for dysuria.   Neurological: Negative for syncope and headaches.     BP 168/87   Pulse 65   Temp 98.1 F (36.7 C) (Oral)   Ht 1.549 m (5\' 1" )   Wt 63 kg (139 lb)   BMI 26.26 kg/m        Objective:    Physical Exam   Constitutional: She appears well-developed. No distress.   HENT:   Mouth/Throat: Oropharynx is clear and moist. No oropharyngeal exudate.   Eyes: Conjunctivae are normal. No scleral icterus.   Neck: Neck supple. No JVD present. Carotid bruit is not present. No thyromegaly present.   Cardiovascular: Normal rate, regular rhythm, normal heart sounds and intact distal pulses.  Exam reveals no gallop and no friction rub.    No murmur heard.  Pulmonary/Chest: Effort normal and breath sounds normal. No respiratory distress. She has no wheezes. She has no rales.   Abdominal: Soft. Bowel sounds are normal. She exhibits no distension and no mass. There is no tenderness. There is no rebound and no guarding.   Musculoskeletal: She exhibits no edema.   Lymphadenopathy:      She has no cervical adenopathy.   Neurological: She is alert.           Assessment:       1. Essential hypertension     2. Gastroesophageal reflux disease, esophagitis presence not specified     3. S/P gastric bypass     4. Anxiety     5. Heart palpitations            Plan:      Procedures  No orders of the defined types were placed in this encounter.    increase atenolol to 1 and 1/2 tab   Monitor BP  F/u card  Parke Simmers diet,   Continue protonix,   F/u 4 weeks

## 2017-01-07 NOTE — Progress Notes (Signed)
Have you seen any specialists/other providers since your last visit with [place]?    Yes Cardiologist, was placed on a heart monitor on January 05, 2017      Limb alert protocol reviewed?      Yes

## 2017-01-13 ENCOUNTER — Ambulatory Visit (INDEPENDENT_AMBULATORY_CARE_PROVIDER_SITE_OTHER): Payer: Commercial Managed Care - HMO | Admitting: Cardiovascular Disease

## 2017-01-13 DIAGNOSIS — R002 Palpitations: Secondary | ICD-10-CM

## 2017-01-13 DIAGNOSIS — R9431 Abnormal electrocardiogram [ECG] [EKG]: Secondary | ICD-10-CM

## 2017-01-13 NOTE — Procedures (Signed)
HOLTER REPORT    [address] [facility] - [facility]  Tel [phone]      PATIENT:  Amber [name]         MRN: [id number].  Gender: female.  DOB: [birth year].  Age: 66 y.o.  Date of study:  01/05/2017    Ordering Physician:  Particia Nearing, MD  Primary Physician:  Oneita Jolly, MD  Primary Cardiologist:  Particia Nearing, MD    INDICATION:    1. Palpitations          DATA:  Test of hookup:  01/05/2017  Recording time:  24 hours  Quality:  good   Tech comments:       Heart Rate Data     Total beats: 86783   Min HR: 44   Avg HR: 61   Max HR: 91     Pauses > 2.5 sec: 0        Longest: 0 sec  Ventricular Ectopy     Total VE beats: 0 (0.1%)   Vent runs: 0 events          Longest: 0 beats          Fastest: 0 bpm   Triplets: 0 events   Couplets: 0 events   Supraventricular Ectopy     Total SVE beats: 5 (0.1%)   Atrial runs: 0 events          Longest: 0 beats          Fastest: 0 bpm   Atrial pairs: 2 events     FINDINGS:  Baseline rhythm:  Sinus rhythm    Arrhythmia:  No significant arrhythmias    Symptoms reported:  none  Symptom correlation with arrhythmia:  diary submitted but inconsistent correlation with rhythm disturbances    IMPRESSION:   Unremarkable Holter monitoring period.   Normal sinus rhythm.   Rare supraventricular ectopy.   Inconsistent rhythm to symptom correlation.      Interpreted and electronically signed by:  Renard Hamper, MD, MS  [facility], [facility] [place] - Norberto Sorenson - Faythe Dingwall

## 2017-01-19 ENCOUNTER — Ambulatory Visit (INDEPENDENT_AMBULATORY_CARE_PROVIDER_SITE_OTHER): Payer: Commercial Managed Care - HMO | Admitting: Cardiology

## 2017-02-02 ENCOUNTER — Encounter (INDEPENDENT_AMBULATORY_CARE_PROVIDER_SITE_OTHER): Payer: Self-pay

## 2017-02-02 ENCOUNTER — Encounter (INDEPENDENT_AMBULATORY_CARE_PROVIDER_SITE_OTHER): Payer: Self-pay | Admitting: Cardiology

## 2017-02-02 ENCOUNTER — Ambulatory Visit (INDEPENDENT_AMBULATORY_CARE_PROVIDER_SITE_OTHER): Payer: Commercial Managed Care - HMO | Admitting: Cardiology

## 2017-02-02 VITALS — BP 145/81 | HR 59 | Resp 16 | Ht 61.0 in | Wt 132.0 lb

## 2017-02-02 DIAGNOSIS — R002 Palpitations: Secondary | ICD-10-CM

## 2017-02-02 DIAGNOSIS — R0789 Other chest pain: Secondary | ICD-10-CM

## 2017-02-02 NOTE — Progress Notes (Signed)
I messaged S. Roy in arrhythmia center to order event monitor for pt.

## 2017-02-02 NOTE — Progress Notes (Signed)
Amber [name] [phone]. with a history of HTN presents for cardiac follow up    He major complaint is palpitations and chest pain    Palpitations              - daily              - no syncope   - no significant events on 24 hour holter    Chest pain              - atypical              - normal CE on recent visit to hospital    HTN              - better controlled at home      Cardiac work up has included              - cardiac cath ( 01/08/14 - normal )  - echocardiogram ( EF 55%, mild TR)   - 24 hour holter - no significant events        Past Medical History:   Diagnosis Date   . Chest pain 2016     Chest pain after eating x6  months--being followed by GI for GERD   . Constipation    . Difficulty walking     amb with cane secondary to arthritis bil knees   . Genital herpes     no current outbreak (10/03/15)   . GERD (gastroesophageal reflux disease)    . Hiatal hernia    . Hyperlipidemia    . Hypertensive disorder     Well controlled on med. Denies any SOB in last 6 months (10/03/15)   . Morbid obesity with BMI of 40.0-44.9, adult     BMI 40.9   . Nausea without vomiting    . OSA on CPAP 2015    CPAP nightly x 1 yr   . Osteoarthritis of knees, bilateral    . TIA (transient ischemic attack) 2014    TIA 2014-no residual. Patient evaulated for possible TIA 07/12/2015  @ [facility] (dischage summary in epic)-per summary CT of head was normal and patient was d/c'd     Family History   Problem Relation Age of Onset   . Cancer Mother 56     breast cancer     Current Outpatient Prescriptions   Medication Sig Dispense Refill   . albuterol (PROVENTIL HFA;VENTOLIN HFA) 108 (90 BASE) MCG/ACT inhaler Inhale into the lungs every 4 (four) hours as needed.        Marland Kitchen atenolol (TENORMIN) 50 MG tablet TAKE 1 and 1/2 TABLET BY MOUTH DAILY. (Patient taking differently: 75 mg.TAKE 1  TABLET BY MOUTH DAILY.    ) 140 tablet 1   . B Complex Vitamins (VITAMIN-B COMPLEX) Tab Take  by Mouth Once a Day.     . cholecalciferol  (VITAMIN D-1000 MAX ST) 1000 units tablet Take 1,000 Units by Mouth Once a Day.     . desonide (DESOWEN) 0.05 % cream Apply topically 2 (two) times daily as needed. 60 g 2   . diazePAM (VALIUM) 2 MG tablet Take 1 tablet (2 mg total) by mouth every 8 (eight) hours as needed for Anxiety. 30 tablet 2   . docusate sodium (COLACE) 100 MG capsule Take 100 mg by mouth daily.         . fluticasone (FLONASE) 50 MCG/ACT nasal spray 2 sprays by Nasal route daily. (Patient taking differently:  2 sprays by Nasal route as needed.   ) 16 g 2   . pantoprazole (PROTONIX) 40 MG tablet Take 1 tablet (40 mg total) by mouth daily. 90 tablet 1   . polyethylene glycol (MIRALAX) packet Take 17 g by mouth daily as needed. (Patient taking differently: 17 g every other day.    ) 5 packet 0   . valACYclovir HCL (VALTREX) 500 MG tablet TAKE 1 TABLET(500 MG) BY MOUTH DAILY 90 tablet 0   . vitamin A 96045 UNIT capsule Take 10,000 Units by Mouth Once a Day.       No current facility-administered medications for this visit.      PE:    Vitals:    02/02/17 1034   BP: 145/81   Pulse: (!) 59   Resp: 16     Body mass index is 24.94 kg/m.    General:  nad  cv rr   Lung cta  abd s  Ext no edema    EKG:    Sb, incomplete RBBB, LHV by voltage    Labs:  Lipid Panel   Cholesterol   Date/Time Value Ref Range Status   11/09/2016 07:28 AM 169 <200 mg/dL Final     Triglycerides   Date/Time Value Ref Range Status   11/09/2016 07:28 AM 51 <150 mg/dL Final   40/98/1191 47:82 AM 63 34 - 149 mg/dL Final     HDL   Date/Time Value Ref Range Status   11/09/2016 07:28 AM 70 >50 mg/dL Final       CMP:   Sodium   Date/Time Value Ref Range Status   01/02/2017 06:26 AM 143 136 - 145 mEq/L Final     Potassium   Date/Time Value Ref Range Status   01/02/2017 06:26 AM 3.6 3.5 - 5.1 mEq/L Final     Chloride   Date/Time Value Ref Range Status   01/02/2017 06:26 AM 108 100 - 111 mEq/L Final   11/09/2016 07:28 AM 105 98 - 110 mmol/L Final     CO2   Date/Time Value Ref Range Status    01/02/2017 06:26 AM 26 22 - 29 mEq/L Final     Glucose   Date/Time Value Ref Range Status   01/02/2017 06:26 AM 75 70 - 100 mg/dL Final     Comment:     ADA guidelines for diabetes mellitus:  Fasting:  Equal to or greater than 126 mg/dL  Random:   Equal to or greater than 200 mg/dL       BUN   Date/Time Value Ref Range Status   01/02/2017 06:26 AM 19 7 - 19 mg/dL Final     Protein, Total   Date/Time Value Ref Range Status   01/02/2017 01:08 AM 7.8 6.0 - 8.3 g/dL Final   95/62/1308 65:78 AM 6.3 6.1 - 8.1 g/dL Final     Alkaline Phosphatase   Date/Time Value Ref Range Status   01/02/2017 01:08 AM 102 37 - 106 U/L Final     AST (SGOT)   Date/Time Value Ref Range Status   01/02/2017 01:08 AM 21 5 - 34 U/L Final     ALT   Date/Time Value Ref Range Status   01/02/2017 01:08 AM 12 0 - 55 U/L Final     Anion Gap   Date/Time Value Ref Range Status   01/02/2017 06:26 AM 9.0 5.0 - 15.0 Final       CBC:   WBC   Date/Time Value Ref Range Status  01/02/2017 01:08 AM 6.67 3.50 - 10.80 x10 3/uL Final   06/30/2009 04:03 PM 9.12 3.50 - 10.80 /CUMM Final     RBC   Date/Time Value Ref Range Status   01/02/2017 01:08 AM 5.01 4.20 - 5.40 x10 6/uL Final     Hemoglobin   Date/Time Value Ref Range Status   11/09/2016 07:28 AM 12.4 11.7 - 15.5 g/dL Final     Hgb   Date/Time Value Ref Range Status   01/02/2017 01:08 AM 14.3 12.0 - 16.0 g/dL Final     Hematocrit   Date/Time Value Ref Range Status   01/02/2017 01:08 AM 42.2 37.0 - 47.0 % Final     MCV   Date/Time Value Ref Range Status   01/02/2017 01:08 AM 84.2 80.0 - 100.0 fL Final     MCHC   Date/Time Value Ref Range Status   01/02/2017 01:08 AM 33.9 32.0 - 36.0 g/dL Final     RDW   Date/Time Value Ref Range Status   01/02/2017 01:08 AM 14 12 - 15 % Final     Platelets   Date/Time Value Ref Range Status   01/02/2017 01:08 AM 269 140 - 400 x10 3/uL Final   11/09/2016 07:28 AM 219 140 - 400 Thousand/uL Final           Impression / plan    Chest pain   - noncardiac    - needs to follow up  with GI   - continue PPI  Palpitations   - no significant event on 24 hour holter - will check 30 day event monitor    rtc 6 weeks    Aavya Shafer Dan Humphreys  02/02/2017

## 2017-02-04 ENCOUNTER — Encounter (INDEPENDENT_AMBULATORY_CARE_PROVIDER_SITE_OTHER): Payer: Self-pay | Admitting: Internal Medicine

## 2017-02-04 ENCOUNTER — Ambulatory Visit (INDEPENDENT_AMBULATORY_CARE_PROVIDER_SITE_OTHER): Payer: Commercial Managed Care - HMO | Admitting: Internal Medicine

## 2017-02-04 VITALS — BP 170/88 | HR 58 | Temp 97.4°F | Ht 61.0 in | Wt 135.6 lb

## 2017-02-04 DIAGNOSIS — N760 Acute vaginitis: Secondary | ICD-10-CM

## 2017-02-04 DIAGNOSIS — Z124 Encounter for screening for malignant neoplasm of cervix: Secondary | ICD-10-CM

## 2017-02-04 DIAGNOSIS — I10 Essential (primary) hypertension: Secondary | ICD-10-CM

## 2017-02-04 MED ORDER — FLUCONAZOLE 150 MG PO TABS
150.0000 mg | ORAL_TABLET | Freq: Once | ORAL | 0 refills | Status: DC
Start: 2017-02-04 — End: 2017-02-17

## 2017-02-04 MED ORDER — CLINDAMYCIN HCL 300 MG PO CAPS
300.0000 mg | ORAL_CAPSULE | Freq: Three times a day (TID) | ORAL | 0 refills | Status: DC
Start: 2017-02-04 — End: 2017-04-03

## 2017-02-04 NOTE — Progress Notes (Signed)
Subjective:       Patient ID: Amber [name] is a 66 y.o. female.    HPI  Pt is here for follow up and c/o    1. Vaginal discharge, itchiness x few days, after recent sexual encounter, +dryness,   No dysuria,   2. HTN, home BP 120's/70-80's  Saw card, for palp, to start Holter, no syncope,     The following portions of the patient's history were reviewed and updated as appropriate: allergies, current medications, past family history, past medical history, past social history, past surgical history and problem list.    Review of Systems   Constitutional: Negative for appetite change, chills, fever and unexpected weight change.   Respiratory: Negative for shortness of breath.    Cardiovascular: Negative for chest pain and palpitations.   Gastrointestinal: Negative for nausea and vomiting.   Genitourinary: Positive for vaginal discharge. Negative for dysuria, frequency and hematuria.   Neurological: Negative for syncope, light-headedness and headaches.     BP 170/88   Pulse (!) 58   Temp 97.4 F (36.3 C)   Ht 1.549 m (5\' 1" )   Wt 61.5 kg (135 lb 9.6 oz)   BMI 25.62 kg/m        Objective:    Physical Exam   Constitutional: She appears well-developed. No distress.   HENT:   Mouth/Throat: Oropharynx is clear and moist. No oropharyngeal exudate.   Eyes: Conjunctivae are normal. No scleral icterus.   Neck: Neck supple. No JVD present. Carotid bruit is not present. No thyromegaly present.   Cardiovascular: Normal rate, regular rhythm and normal heart sounds.  Exam reveals no gallop and no friction rub.    No murmur heard.  Pulmonary/Chest: Effort normal and breath sounds normal. No respiratory distress. She has no rales.   Abdominal: Soft. Bowel sounds are normal. She exhibits no distension and no mass. There is tenderness. There is no rebound and no guarding. Hernia confirmed negative in the right inguinal area and confirmed negative in the left inguinal area.   +min lower abd tenderness   Genitourinary: Pelvic  exam was performed with patient supine. There is no rash, tenderness or lesion on the right labia. There is no rash, tenderness or lesion on the left labia. Cervix exhibits no motion tenderness and no friability. Right adnexum displays no mass and no tenderness. Left adnexum displays no mass. Vaginal discharge found.   Musculoskeletal: She exhibits no edema.   Lymphadenopathy:        Right: No inguinal adenopathy present.        Left: No inguinal adenopathy present.   Neurological: She is alert.           Assessment:       1. Acute vaginitis  SureSwab(TM) Plus    fluconazole (DIFLUCAN) 150 MG tablet    clindamycin (CLEOCIN) 300 MG capsule   2. White coat syndrome with diagnosis of hypertension     3. Cervical cancer screening  Pap Smear, Thin Prep w/ reflex to HR HPV          Plan:      Procedures  No orders of the defined types were placed in this encounter.      Vaginal cx obtained  rx empiric Diflucan  rx clindamycin (pt has multiple med allergies)  F/u ob/gyn if sx persistent  Continue meds  Monitor BP  Further recommendations pending test results.

## 2017-02-04 NOTE — Progress Notes (Signed)
Nursing Documentation:  Limb alert status: Patient asked and denied any limb restrictions for blood pressure/blood draws.  Has the patient seen any other providers since their last visit: [facility]  The patient is due for pap smear and mammogram

## 2017-02-05 ENCOUNTER — Other Ambulatory Visit (INDEPENDENT_AMBULATORY_CARE_PROVIDER_SITE_OTHER): Payer: Self-pay | Admitting: Internal Medicine

## 2017-02-05 DIAGNOSIS — R002 Palpitations: Secondary | ICD-10-CM

## 2017-02-05 DIAGNOSIS — I10 Essential (primary) hypertension: Secondary | ICD-10-CM

## 2017-02-06 ENCOUNTER — Encounter (INDEPENDENT_AMBULATORY_CARE_PROVIDER_SITE_OTHER): Payer: Self-pay | Admitting: Internal Medicine

## 2017-02-06 LAB — SURESWAB(TM) PLUS
Atopobium vaginae: NOT DETECTED
Candida Glabrata,DNA: NOT DETECTED
Candida Parapsilosis,DNA: NOT DETECTED
Candida Tropicalis,DNA: NOT DETECTED
Candida albicans DNA: NOT DETECTED
Chlamydia trachomatis RNA, TMA: NOT DETECTED
Gardnerella vaginalis: 7.6
Lactobacillus species: NOT DETECTED
Megasphaera species: NOT DETECTED
N. gonorrhoeae RNA, TMA: NOT DETECTED
Trichomonas Vaginalis RNA, QL TMA: NOT DETECTED

## 2017-02-12 LAB — PAP SMEAR, THIN PREP W/ REFLEX TO HR HPV

## 2017-02-14 ENCOUNTER — Encounter (INDEPENDENT_AMBULATORY_CARE_PROVIDER_SITE_OTHER): Payer: Self-pay | Admitting: Internal Medicine

## 2017-02-17 ENCOUNTER — Other Ambulatory Visit (INDEPENDENT_AMBULATORY_CARE_PROVIDER_SITE_OTHER): Payer: Self-pay | Admitting: Internal Medicine

## 2017-02-17 DIAGNOSIS — N760 Acute vaginitis: Secondary | ICD-10-CM

## 2017-02-17 MED ORDER — FLUCONAZOLE 150 MG PO TABS
150.0000 mg | ORAL_TABLET | Freq: Once | ORAL | 1 refills | Status: AC
Start: 2017-02-17 — End: 2017-02-17

## 2017-02-28 ENCOUNTER — Other Ambulatory Visit (INDEPENDENT_AMBULATORY_CARE_PROVIDER_SITE_OTHER): Payer: Self-pay | Admitting: Internal Medicine

## 2017-02-28 DIAGNOSIS — I10 Essential (primary) hypertension: Secondary | ICD-10-CM

## 2017-02-28 DIAGNOSIS — R002 Palpitations: Secondary | ICD-10-CM

## 2017-03-01 ENCOUNTER — Telehealth (HOSPITAL_BASED_OUTPATIENT_CLINIC_OR_DEPARTMENT_OTHER): Payer: Self-pay | Admitting: Gastroenterology

## 2017-03-01 NOTE — Telephone Encounter (Signed)
(  TEXTING IS AN OPTION FOR [facility] CL[facility] ONLY)  Is this a [place] clinic? No      RETURN CALL: Detailed message on voicemail only      SUBJECT:  Appointment Request     REASON FOR REQUEST/SYMPTOMS: Endoscopy   REFERRING PROVIDER: N/A  REQUEST APPOINTMENT WITH: Dr. Tye Savoy  REQUESTED DATE: To be discussed, TIME: Morning  UNABLE TO APPOINT BECAUSE: [facility] was unable to warm transfer. Clinic was busy

## 2017-03-02 ENCOUNTER — Telehealth (HOSPITAL_BASED_OUTPATIENT_CLINIC_OR_DEPARTMENT_OTHER): Payer: Self-pay | Admitting: Gastroenterology

## 2017-03-02 NOTE — Telephone Encounter (Signed)
Sharepoint Assessment Questions    5'4"  162 LBS    GENERAL QUESTIONS:  Does the patient have sufficient understanding of English? Yes    Is the patient able to provide consent? Yes     Patient Assessment    Have you had a previous Endoscopy? Yes  If yes, where/when/what procedure type?  @ [place]    Do you have an allergy to Fentanyl or Versed? No    Have you ever had any problems in the past with sedation (Different from General Anesthesia)? No    Do you use narcotics on a daily basis? No    Does you use oxygen at Home?  No    Are you taking any Anti-coagulant or Anti-Platelet Medications? No    Do you have any bleeding or coagulation disorders? No    Are you Diabetic?  No    Do you have End Stage Renal Disease? No    Do you have End Stage Liver Disease? No    Do you have diagnosed sleep apnea? No    Do you currently have a diagnosis of Congestive Heart Failure or CHF that you are being treated for? No    Do you have a Pacemaker or Defibrillator? No    Are you a difficult IV start? No    Do you have any mobility issues that make it difficult to get onto a stretcher? No    Is this patient scheduled for a colonoscopy? No    Is this an Upper Endoscopy SEDATION Case (EGD, EUS, PEG)? Yes  If yes, are you able to open your mouth fully without any difficulty? Yes  If no, arranged for Anesthesia? N/a    Is the patient scheduled for an ERCP? No    Is this patient scheduled for a PEG? No    Is the patient scheduled for an ALS PEG?  No    PROCEDURE        Procedure MD: Tye Savoy  Procedure Type: EUS  Procedure Date:  5/14     Time: 1115    Procedure Check-In Time: 1045    Patient Teaching  Who received the teaching - Patient? Yes    Was the topic of stopping iron supplements taught? Yes    Was the topic of blood thinners taught?  Yes    Was the topic of diet taught? Yes    Were instructions for taking current medications taught? Yes    Was our transportation policy taught? Yes    What is the name of their driver? Sister    How  were the procedure preparation instruction materials delivered?  Verbal: Yes  Mailed Paper: Yes  eCare message: No  Email: No    Does this procedure require bowel prep? No  If yes, were instructions for the ordered laxative taught? No  If yes, which RX was prescribed? NA.    General Notes:

## 2017-03-02 NOTE — Telephone Encounter (Signed)
Routing to interventional team

## 2017-03-02 NOTE — Telephone Encounter (Signed)
LVM to schedule. Requested a c/b at tel: [phone].

## 2017-03-02 NOTE — Telephone Encounter (Signed)
Patient is due for yearly EUS. Routing to PCCs to call and schedule.

## 2017-03-08 ENCOUNTER — Ambulatory Visit (INDEPENDENT_AMBULATORY_CARE_PROVIDER_SITE_OTHER): Payer: Commercial Managed Care - HMO | Admitting: Cardiology

## 2017-03-08 DIAGNOSIS — R002 Palpitations: Secondary | ICD-10-CM

## 2017-03-08 NOTE — Procedures (Signed)
[facility] - [facility]      CAITLIN AINLEY         [id number], female, [birth year]    Ordering Physician:  Particia Nearing, MD  Primary Physician:  Oneita Jolly, MD  Primary Cardiologist:  Particia Nearing, MD    Indication:  PALPITATIONS     Data     Hook up date:  02/04/2017        Disconnect date:  03/05/2017  Total days of monitoring:  30  Quality:  good   Tech comments:       Findings     Baseline rhythm:  Sinus rhythm  Arrhythmia:  none    Symptoms reported:  none  Symptom correlation with arrhythmia:  diary not submitted    Impression      Normal study   No arrhythmias      Interpreted and electronically signed by:  Particia Nearing, MD

## 2017-03-11 ENCOUNTER — Other Ambulatory Visit (INDEPENDENT_AMBULATORY_CARE_PROVIDER_SITE_OTHER): Payer: Self-pay | Admitting: Internal Medicine

## 2017-03-11 DIAGNOSIS — A6000 Herpesviral infection of urogenital system, unspecified: Secondary | ICD-10-CM

## 2017-03-15 ENCOUNTER — Encounter (HOSPITAL_BASED_OUTPATIENT_CLINIC_OR_DEPARTMENT_OTHER): Payer: No Typology Code available for payment source | Admitting: Gastroenterology

## 2017-03-18 ENCOUNTER — Ambulatory Visit (INDEPENDENT_AMBULATORY_CARE_PROVIDER_SITE_OTHER): Payer: Commercial Managed Care - HMO | Admitting: Cardiology

## 2017-03-31 ENCOUNTER — Ambulatory Visit: Payer: No Typology Code available for payment source | Attending: Gastroenterology | Admitting: Gastroenterology

## 2017-03-31 DIAGNOSIS — Z48815 Encounter for surgical aftercare following surgery on the digestive system: Secondary | ICD-10-CM | POA: Insufficient documentation

## 2017-03-31 DIAGNOSIS — K862 Cyst of pancreas: Secondary | ICD-10-CM

## 2017-04-01 ENCOUNTER — Telehealth (HOSPITAL_BASED_OUTPATIENT_CLINIC_OR_DEPARTMENT_OTHER): Payer: Self-pay | Admitting: Gastroenterology

## 2017-04-01 NOTE — Telephone Encounter (Signed)
Post procedure follow up.  Dr Saunders recommends the following:    - Repeat the upper endoscopic ultrasound in 1 year for                        surveillance.

## 2017-04-01 NOTE — Telephone Encounter (Signed)
Patient placed on the recall list to schedule in one year. Patient will receive a letter in the mail regarding scheduling.

## 2017-04-03 ENCOUNTER — Emergency Department: Payer: Commercial Managed Care - HMO

## 2017-04-03 ENCOUNTER — Emergency Department
Admission: EM | Admit: 2017-04-03 | Discharge: 2017-04-03 | Disposition: A | Discharge status: Home / Self Care | Payer: Commercial Managed Care - HMO | Attending: Emergency Medicine | Admitting: Emergency Medicine

## 2017-04-03 DIAGNOSIS — Z9049 Acquired absence of other specified parts of digestive tract: Secondary | ICD-10-CM | POA: Insufficient documentation

## 2017-04-03 DIAGNOSIS — R079 Chest pain, unspecified: Secondary | ICD-10-CM | POA: Insufficient documentation

## 2017-04-03 DIAGNOSIS — Z79899 Other long term (current) drug therapy: Secondary | ICD-10-CM | POA: Insufficient documentation

## 2017-04-03 DIAGNOSIS — K219 Gastro-esophageal reflux disease without esophagitis: Secondary | ICD-10-CM | POA: Insufficient documentation

## 2017-04-03 DIAGNOSIS — I10 Essential (primary) hypertension: Secondary | ICD-10-CM | POA: Insufficient documentation

## 2017-04-03 DIAGNOSIS — Z8673 Personal history of transient ischemic attack (TIA), and cerebral infarction without residual deficits: Secondary | ICD-10-CM | POA: Insufficient documentation

## 2017-04-03 LAB — COMPREHENSIVE METABOLIC PANEL
ALT: 14 U/L (ref 0–55)
AST (SGOT): 18 U/L (ref 5–34)
Albumin/Globulin Ratio: 1.5 (ref 0.9–2.2)
Albumin: 4 g/dL (ref 3.5–5.0)
Alkaline Phosphatase: 102 U/L (ref 37–106)
Anion Gap: 8 (ref 5.0–15.0)
BUN: 12 mg/dL (ref 7.0–19.0)
Bilirubin, Total: 0.6 mg/dL (ref 0.2–1.2)
CO2: 27 mEq/L (ref 22–29)
Calcium: 9.5 mg/dL (ref 8.5–10.5)
Chloride: 107 mEq/L (ref 100–111)
Creatinine: 0.7 mg/dL (ref 0.6–1.0)
Globulin: 2.7 g/dL (ref 2.0–3.6)
Glucose: 88 mg/dL (ref 70–100)
Potassium: 3.5 mEq/L (ref 3.5–5.1)
Protein, Total: 6.7 g/dL (ref 6.0–8.3)
Sodium: 142 mEq/L (ref 136–145)

## 2017-04-03 LAB — PT AND APTT
PT INR: 1 (ref 0.9–1.1)
PT: 13.2 s (ref 12.6–15.0)
PTT: 31 s (ref 23–37)

## 2017-04-03 LAB — CBC AND DIFFERENTIAL
Absolute NRBC: 0 10*3/uL
Basophils Absolute Automated: 0.03 10*3/uL (ref 0.00–0.20)
Basophils Automated: 0.8 %
Eosinophils Absolute Automated: 0.07 10*3/uL (ref 0.00–0.70)
Eosinophils Automated: 1.9 %
Hematocrit: 37.8 % (ref 37.0–47.0)
Hgb: 12.5 g/dL (ref 12.0–16.0)
Immature Granulocytes Absolute: 0 10*3/uL
Immature Granulocytes: 0 %
Lymphocytes Absolute Automated: 1.38 10*3/uL (ref 0.50–4.40)
Lymphocytes Automated: 37.2 %
MCH: 27.9 pg — ABNORMAL LOW (ref 28.0–32.0)
MCHC: 33.1 g/dL (ref 32.0–36.0)
MCV: 84.4 fL (ref 80.0–100.0)
MPV: 9.9 fL (ref 9.4–12.3)
Monocytes Absolute Automated: 0.43 10*3/uL (ref 0.00–1.20)
Monocytes: 11.6 %
Neutrophils Absolute: 1.8 10*3/uL (ref 1.80–8.10)
Neutrophils: 48.5 %
Nucleated RBC: 0 /100 WBC (ref 0.0–1.0)
Platelets: 226 10*3/uL (ref 140–400)
RBC: 4.48 10*6/uL (ref 4.20–5.40)
RDW: 14 % (ref 12–15)
WBC: 3.71 10*3/uL (ref 3.50–10.80)

## 2017-04-03 LAB — TROPONIN I
Troponin I: <0.01 ng/mL (ref 0.00–0.09)
Troponin I: <0.01 ng/mL (ref 0.00–0.09)

## 2017-04-03 LAB — GFR: EGFR: >60

## 2017-04-03 MED ORDER — FAMOTIDINE 10 MG/ML IV SOLN (WRAP)
20.0000 mg | Freq: Once | INTRAVENOUS | Status: AC
Start: 2017-04-03 — End: 2017-04-03
  Administered 2017-04-03: 09:00:00 20 mg via INTRAVENOUS
  Filled 2017-04-03: qty 2

## 2017-04-03 MED ORDER — LIDOCAINE VISCOUS 2 % MT SOLN
10.0000 mL | Freq: Once | OROMUCOSAL | Status: AC
Start: 2017-04-03 — End: 2017-04-03
  Administered 2017-04-03: 09:00:00 10 mL via OROMUCOSAL
  Filled 2017-04-03: qty 15

## 2017-04-03 MED ORDER — SUCRALFATE 1 G PO TABS
1.0000 g | ORAL_TABLET | Freq: Four times a day (QID) | ORAL | 0 refills | Status: DC
Start: 2017-04-03 — End: 2017-04-13

## 2017-04-03 MED ORDER — ONDANSETRON HCL 4 MG/2ML IJ SOLN
4.0000 mg | Freq: Once | INTRAMUSCULAR | Status: AC
Start: 2017-04-03 — End: 2017-04-03
  Administered 2017-04-03: 09:00:00 4 mg via INTRAVENOUS
  Filled 2017-04-03: qty 2

## 2017-04-03 MED ORDER — ALUM & MAG HYDROXIDE-SIMETH 200-200-20 MG/5ML PO SUSP
30.0000 mL | Freq: Once | ORAL | Status: AC
Start: 2017-04-03 — End: 2017-04-03
  Administered 2017-04-03: 09:00:00 30 mL via ORAL
  Filled 2017-04-03: qty 30

## 2017-04-03 MED ORDER — KETOROLAC TROMETHAMINE 30 MG/ML IJ SOLN
15.0000 mg | Freq: Once | INTRAMUSCULAR | Status: AC
Start: 2017-04-03 — End: 2017-04-03
  Administered 2017-04-03: 09:00:00 15 mg via INTRAVENOUS
  Filled 2017-04-03: qty 1

## 2017-04-03 NOTE — ED Triage Notes (Signed)
Pt states that she has had chest pain and headache since yesterday.  Pt states that chest pain is worse today but headache is just dull.  Pt denies vomiting but states that she feels nausea and shortness of breath.  No focal weakness.

## 2017-04-03 NOTE — Discharge Instructions (Signed)
1. Return immediately if worse in any way.    2. Follow up with your primary medical doctor and cardiology for recheck.      Chest Pain of Unclear Etiology    You have been seen for chest pain. The cause of your pain is not yet known.    Your doctor has learned about your medical history, examined you, and checked any tests that were done. Still, it is unclear why you are having pain. The doctor thinks there is only a very small chance that your pain is caused by a life-threatening condition. Later, your primary care doctor might do more tests or check you again.    Sometimes chest pain is caused by a dangerous condition, like a heart attack, aorta injury, blood clot in the lung, or collapsed lung. It is unlikely that your pain is caused by a life-threatening condition if: Your chest pain lasts only a few seconds at a time; you are not short of breath, nauseated (sick to your stomach), sweaty, or lightheaded; your pain gets worse when you twist or bend; your pain improves with exercise or hard work.    Chest pain is serious. It is VERY IMPORTANT that you follow up with your regular doctor and seek medical attention immediately here or at the nearest Emergency Department if your symptoms become worse or they change.    YOU SHOULD SEEK MEDICAL ATTENTION IMMEDIATELY, EITHER HERE OR AT THE NEAREST EMERGENCY DEPARTMENT, IF ANY OF THE FOLLOWING OCCURS:   Your pain gets worse.   Your pain makes you short of breath, nauseated, or sweaty.   Your pain gets worse when you walk, go up stairs, or exert yourself.   You feel weak, lightheaded, or faint.   It hurts to breathe.   Your leg swells.   Your symptoms get worse or you have new symptoms or concerns.

## 2017-04-03 NOTE — ED Notes (Signed)
Pt discharged in stable condition.  IV access removed intact.  Discharge instructions and prescriptions given and explained.  Pt stated understanding.

## 2017-04-04 LAB — ECG 12-LEAD
Atrial Rate: 65 {beats}/min
P Axis: 50 degrees
P-R Interval: 158 ms
Q-T Interval: 400 ms
QRS Duration: 80 ms
QTC Calculation (Bezet): 416 ms
R Axis: -6 degrees
T Axis: 26 degrees
Ventricular Rate: 65 {beats}/min

## 2017-04-04 NOTE — ED Provider Notes (Signed)
EMERGENCY DEPARTMENT NOTE    Physician/Midlevel provider first contact with patient: 04/03/17 0750         HISTORY OF PRESENT ILLNESS   Historian:Patient  Translator Used: No    Chief Complaint: Chest Pain and Headache     66 y.o. female presents to the ED with constant nonradiating central chest ache.  Patient was at rest when pain started about 24 hours PTA.  Patient denies SOB to me but notes nausea without vomiting.  No fever/cough.  H/o similar with acid reflux in the past.  Normal BM.  No abd pain.  CP does not worsen with exertion.  No trauma.  No rash.    1. Location of symptoms: chest  2. Onset of symptoms: 24 hours PTA  3. What was patient doing when symptoms started (Context): see above  4. Severity: moderate  5. Timing: constant  6. Activities that worsen symptoms: none  7. Activities that improve symptoms: none  8. Quality: ache  9. Radiation of symptoms: no  10. Associated signs and Symptoms: see above  11. Are symptoms worsening? yes  MEDICAL HISTORY     Past Medical History:  Past Medical History:   Diagnosis Date   . Chest pain 2016     Chest pain after eating x6  months--being followed by GI for GERD   . Constipation    . Difficulty walking     amb with cane secondary to arthritis bil knees   . Genital herpes     no current outbreak (10/03/15)   . GERD (gastroesophageal reflux disease)    . Hiatal hernia    . Hyperlipidemia    . Hypertensive disorder     Well controlled on med. Denies any SOB in last 6 months (10/03/15)   . Morbid obesity with BMI of 40.0-44.9, adult     BMI 40.9   . Nausea without vomiting    . OSA on CPAP 2015    CPAP nightly x 1 yr   . Osteoarthritis of knees, bilateral    . TIA (transient ischemic attack) 2014    TIA 2014-no residual. Patient evaulated for possible TIA 07/12/2015  @ [facility] (dischage summary in epic)-per summary CT of head was normal and patient was d/c'd       Past Surgical History:  Past Surgical History:   Procedure Laterality Date   . APPENDECTOMY  >25 yrs   .  EGD  01/2015   . fallopian tube surgery  >25 yrs   . INSERTION, PAIN PUMP (MEDICAL) N/A 10/22/2015    Procedure: INSERTION, PAIN PUMP (MEDICAL);  Surgeon: Nicola Police, DO;  Location: [facility];  Service: General;  Laterality: N/A;   . LAPAROSCOPIC, CHOLECYSTECTOMY, CHOLANGIOGRAM  08/13/2014   . LAPAROSCOPIC, GASTRIC BYPASS N/A 10/22/2015    Procedure: LAPAROSCOPIC, GASTRIC BYPASS;  Surgeon: Josefa Half R, DO;  Location: [facility];  Service: General;  Laterality: N/A;   . LAPAROSCOPIC, HERNIORRHAPHY, HIATAL N/A 10/22/2015    Procedure: LAPAROSCOPIC, HERNIORRHAPHY, HIATAL;  Surgeon: Jeanell Sparrow, Hamid R, DO;  Location: [facility];  Service: General;  Laterality: N/A;   . OVARY SURGERY Right >25 yrs   . SPINE SURGERY  11/2013    cervical HNP x 2, Dr. Bufford Buttner       Social History:  Social History     Social History   . Marital status: Single     Spouse name: N/A   . Number of children: 2   .  Years of education: N/A     Occupational History   . legal instrument examiner [employer]      Social History Main Topics   . Smoking status: Never Smoker   . Smokeless tobacco: Never Used   . Alcohol use No   . Drug use: No   . Sexual activity: No     Other Topics Concern   . Dietary Supplements / Vitamins Yes   . Anesthesia Problems No   . Blood Thinners No   . Eats Large Amounts No   . Excessive Sweets No   . Skips Meals No   . Eats Excessive Starches Yes   . Snacks Or Grazes No   . Emotional Eater Yes   . Eats Fried Food Yes   . Eats Fast Food Yes   . Diet Center No   . Hmr No   . [name] No   . La Weight Loss No   . Nutri-System No   . Opti-Fast / Medi-Fast No   . Overeaters Anonymous No   . Physicians Weight Loss Center No   . Tops No   . Weight Watchers No   . Atkins No   . Binging / Purging No   . Body For Life No   . Cabbage Soup No   . Calorie Counting No   . Fasting No   . [name] No   . Health Spa No   . Herbal Life No   . High Protein No   . Low Carb No   . Low Fat  No   . Mayo [facility] Diet No   . [place] Diet No   . Margie Billet Diet No   . [place] Diet No   . Slim Fast No   . [place] No   . Sugar Busters No   . Vomiting No   . Zone Diet No   . Stationary Cycle Or Treadmill No   . Gym/Fitness Classes No   . Home Exercise/Video No   . Swimming No   . Team Sports No   . Weight Training No   . Walking Or Running No   . Hospitalization No   . Hypnosis No   . Physical Therapy No   . Psychological Therapy No   . Residential Program No   . Acutrim No   . Amphetamines No   . Anorex No   . Byetta No   . Dexatrim No   . Didrex No   . Fastin No   . Fen - Phen No   . Ionamin / Adipex No   . Mazanor No   . Meridia No   . Obalan No   . Phendiet No   . Phentrol No   . Phenteramine Yes   . Plegine No   . Pondimin No   . Qsymia No   . Prozac No   . Redux No   . Sanorex No   . Tenuate No   . Tepanole No   . Wechless No   . Wellbutrin No   . Xenical (Orlistat, Alli) No   . Other Med No   . No Impairment Yes     Chronic Bilat Knee Pain   . Walks With Cane/Crutch Yes   . Requires A Wheelchair No   . Bedridden No   . Are You Currently Being Treated For Depression? No   . Do You Snore? Yes   . Are You Receiving  Any Medical Or Psychological Services? No   . Do You Have Or Have You Been Treated For An Eating Disorder? No   . Do You Exercise Regularly? No   . Have You Or Family Member Ever Have Trouble With Anesthesia? No     Social History Narrative   . No narrative on file       Family History:  Family History   Problem Relation Age of Onset   . Cancer Mother 68     breast cancer       Outpatient Medication:  Discharge Medication List as of 04/03/2017 11:47 AM      CONTINUE these medications which have NOT CHANGED    Details   albuterol (PROVENTIL HFA;VENTOLIN HFA) 108 (90 BASE) MCG/ACT inhaler Inhale into the lungs every 4 (four) hours as needed.   , Starting 01/26/2012, Until Discontinued, Historical Med      atenolol (TENORMIN) 50 MG tablet 1 TABLET AND HALF BY MOUTH DAILY, Normal      B  Complex Vitamins (VITAMIN-B COMPLEX) Tab Take  by Mouth Once a Day., Historical Med      cholecalciferol (VITAMIN D-1000 MAX ST) 1000 units tablet Take 1,000 Units by Mouth Once a Day., Historical Med      desonide (DESOWEN) 0.05 % cream Apply topically 2 (two) times daily as needed., Starting 11/07/2014, Until Discontinued, Normal      diazePAM (VALIUM) 2 MG tablet Take 1 tablet (2 mg total) by mouth every 8 (eight) hours as needed for Anxiety., Starting Tue 11/03/2016, Print      docusate sodium (COLACE) 100 MG capsule Take 100 mg by mouth daily.    , Until Discontinued, Historical Med      fluticasone (FLONASE) 50 MCG/ACT nasal spray 2 sprays by Nasal route daily., Starting 01/02/2014, Until Discontinued, Normal      pantoprazole (PROTONIX) 40 MG tablet Take 1 tablet (40 mg total) by mouth daily., Starting Fri 11/06/2016, Normal      valACYclovir HCL (VALTREX) 500 MG tablet TAKE 1 TABLET(500 MG) BY MOUTH DAILY, Normal      vitamin A 16109 UNIT capsule Take 10,000 Units by Mouth Once a Day., Historical Med               REVIEW OF SYSTEMS   Review of Systems   Constitutional: Negative for chills and fever.   Respiratory: Negative for cough and shortness of breath.    Cardiovascular: Positive for chest pain. Negative for leg swelling.   Gastrointestinal: Positive for nausea. Negative for abdominal pain, diarrhea and vomiting.   Musculoskeletal: Negative for back pain and neck pain.   All other systems reviewed and are negative.    PHYSICAL EXAM     ED Triage Vitals [04/03/17 0756]   Enc Vitals Group      BP 182/84      Heart Rate 63      Resp Rate 18      Temp 97.8 F (36.6 C)      Temp Source Oral      SpO2 100 %      Weight 56.7 kg      Height 1.549 m      Head Circumference       Peak Flow       Pain Score 8      Pain Loc       Pain Edu?       Excl. in GC?    Nursing note and vitals reviewed.  Constitutional:  Well developed, well nourished.  Awake & Oriented x3  Head:  Atraumatic.  Normocephalic.    Eyes:  Conjunctivae  are not pale.  ENT:  Mucous membranes are moist and intact.  Oropharynx is clear and symmetric.  Patent airway.  Neck:  Supple.  No JVD.   Cardiovascular:  Regular rate.  Regular rhythm.  No murmurs.  Pulmonary/Chest:  No evidence of respiratory distress.  Clear to auscultation bilaterally.  No wheezing, rales or rhonchi.   Abdominal:  Soft and non-distended.  There is no tenderness.  No rebound, guarding, or rigidity.  No pulsatile masses.  Back:  No swelling or deformity.  Extremities:  No edema.   No cyanosis.  No clubbing.  No calf tenderness. B/l LE symmetric.  2+ radial/DP/PT pulses b/l.  Skin:  Skin is warm and dry.  No diaphoresis. No rash.   Neurological: Normal speech.  Moves all extremities.  Psychiatric:  Good eye contact.  Normal interaction, affect, and behavior    MEDICAL DECISION MAKING     DISCUSSION    Doubt ACS, PE, or aortic abnormality.  Symptoms much improved with treatment in the ED. Dr. Betti Cruz (cardiology) consulted and recommends second troponin and if negative recommends d/c with outpatient f/u.  Second troponin negative.  Patient given precautions.  Patient wishes d/c home.    Will discharge home follow PCP and cardiology return if worse.  Patient understands and agrees with plan.    Follow-up Information     Bari Edward, MD In 2 days.    Specialties:  Cardiology, Internal Medicine  Contact information:  [address] [place]  [address] [address]  [phone]             Oneita Jolly, MD In 2 days.    Specialty:  Internal Medicine  Contact information:  [address]  [place] [address]  [phone]                       Fortino Sic, MD - Mon Jan 08, 2014  9:16 PM EDT  CARDIAC CATHETERIZATION    REFERRED BY:      LOCATION OF PATIENT:  Room: [id number] - [facility]     DATE OF BIRTH:    [birth year]    DATE OF PROCEDURE:  01/08/2014    CARDIOLOGIST:  Fortino Sic, MD    PRECATHETERIZATION DIAGNOSES:  A 66 year old female presents for cardiovascular  evaluation with atypical chest pain, previous normal nuclear stress test.    POSTCATHETERIZATION DIAGNOSES:  Normal left heart coronary angiography, normal LV angiography, transradial approach.    HISTORY:  The patient is a [age]-year-old female, who presents for a cardiovascular evaluation with a history of arterial hypertension and left-sided chest pain.  The patient is having pain consistent with possible angina pectoris, has had a previous nuclear stress test that was unremarkable, in view of that cardiac cath was recommended for further evaluation of the coronary anatomy.    HEMODYNAMICS:  Blood pressure 140-150/70.  LVEDP was 10 mmHg.  Heart rate was 70 beats per minute.    DESCRIPTION OF PROCEDURE:  After informed consent was obtained, the right radial site was utilized.  A 5-French sheath was placed, intra-arterial nitroglycerin was given.  A  TIG catheter was used to engage the left and right coronary artery.  A pigtail catheter was advanced across the aortic valve using10 mL, total volume of 20 mL.  A pullback  was recorded across the aortic valve.  Final pressure recorded.  The TR Band was removed.  Nitroglycerin was given, in the sheath TR band was applied without difficulty.  The patient tolerated the procedure well without any complications.  A 60 mL of contrast was utilized.     Left coronary angiography demonstrates left main bifurcates the LAD.  The LAD was widely patent without any significant epicardial disease noted, bifurcates into a small diagonal branch, septal perforators.  The circ marginal branch is a large vessel, left lateral wall, no other high-grade lesions are seen.     The right coronary artery is a tortuous large vessel, bifurcates PDA and posterolateral branch.  No other high-grade lesions are seen.     LV angiography demonstrates normal LV systolic function, EF of 60%.    CONCLUSION:  Normal LV systolic function, normal left and right coronary angiography.  The patient is to follow  up with the primary care physician.  From a cardiac standpoint, no further cardiac intervention is anticipated at this time  Vital Signs: Reviewed the patient?s vital signs.   Nursing Notes: Reviewed and utilized available nursing notes.  Medical Records Reviewed: Reviewed available past medical records.  Counseling: The emergency provider has spoken with the patient and discussed today?s findings, in addition to providing specific details for the plan of care.  Questions are answered and there is agreement with the plan.        CARDIAC STUDIES    The following cardiac studies were independently interpreted by the Emergency Medicine Physician.  For full cardiac study results please see chart.    Monitor Strip  Interpreted by ED Physician  Rate: 60  Rhythm: NSR   ST Changes: none    EKG Interpretation:  Signed and interpreted by ED Provider   Time Interpreted: [phone]  Rate: 65  Rhythm: NSR  Axis: normal  Intervals: normal  Blocks: none  ST segments: no acute changes  Interpretation: Nonspecific  EKG    EMERGENCY IMAGING STUDIES    The following imagine studies were independently interpreted by me (emergency physician):    Radiology:  Interpreted by me (ED Physician)  Study: Chest Xray   Results: No infiltrate. No pneumothorax. No hemothorax. No cardiomegaly. No CHF.  Impression: No acute intrathoracic abnormality.    RADIOLOGY IMAGING STUDIES      XR Chest  AP Portable   Final Result    No acute cardiopulmonary findings.       Lorinda Creed, MD    04/03/2017 8:24 AM            PULSE OXIMETRY    Oxygen Saturation by Pulse Oximetry: 100%  Interventions: none  Interpretation:  normal    EMERGENCY DEPT. MEDICATIONS      ED Medication Orders     Start Ordered     Status Ordering Provider    04/03/17 0827 04/03/17 0826  ondansetron (ZOFRAN) injection 4 mg  Once     Route: Intravenous  Ordered Dose: 4 mg     Last MAR action:  Given Kimmerly Lora D    04/03/17 0827 04/03/17 0826  famotidine (PEPCID) injection 20 mg  Once      Route: Intravenous  Ordered Dose: 20 mg     Last MAR action:  Given Baila Rouse D    04/03/17 0827 04/03/17 0826  lidocaine viscous (XYLOCAINE) 2 % solution 10 mL  Once     Route: Mouth/Throat  Ordered Dose: 10 mL  Last MAR action:  Given Maranda Marte D    04/03/17 0827 04/03/17 0826  alum & mag hydroxide-simethicone (MAALOX PLUS) 200-200-20 mg/5 mL suspension 30 mL  Once     Route: Oral  Ordered Dose: 30 mL     Last MAR action:  Given Lashawn Orrego D    04/03/17 0827 04/03/17 0826  ketorolac (TORADOL) injection 15 mg  Once     Route: Intravenous  Ordered Dose: 15 mg     Last MAR action:  Given Blane Worthington D          LABORATORY RESULTS    Ordered and independently interpreted AVAILABLE laboratory tests. Please see results section in chart for full details.  Results for orders placed or performed during the hospital encounter of 04/03/17   CBC with differential   Result Value Ref Range    WBC 3.71 3.50 - 10.80 x10 3/uL    Hgb 12.5 12.0 - 16.0 g/dL    Hematocrit 16.1 09.6 - 47.0 %    Platelets 226 140 - 400 x10 3/uL    RBC 4.48 4.20 - 5.40 x10 6/uL    MCV 84.4 80.0 - 100.0 fL    MCH 27.9 (L) 28.0 - 32.0 pg    MCHC 33.1 32.0 - 36.0 g/dL    RDW 14 12 - 15 %    MPV 9.9 9.4 - 12.3 fL    Neutrophils 48.5 None %    Lymphocytes Automated 37.2 None %    Monocytes 11.6 None %    Eosinophils Automated 1.9 None %    Basophils Automated 0.8 None %    Immature Granulocyte 0.0 None %    Nucleated RBC 0.0 0.0 - 1.0 /100 WBC    Neutrophils Absolute 1.80 1.80 - 8.10 x10 3/uL    Abs Lymph Automated 1.38 0.50 - 4.40 x10 3/uL    Abs Mono Automated 0.43 0.00 - 1.20 x10 3/uL    Abs Eos Automated 0.07 0.00 - 0.70 x10 3/uL    Absolute Baso Automated 0.03 0.00 - 0.20 x10 3/uL    Absolute Immature Granulocyte 0.00 0 x10 3/uL    Absolute NRBC 0.00 0 x10 3/uL   PT/APTT   Result Value Ref Range    PT 13.2 12.6 - 15.0 sec    PT INR 1.0 0.9 - 1.1    PT Anticoag. Given Within 48 hrs. None     PTT 31 23 - 37 sec   Comprehensive  metabolic panel   Result Value Ref Range    Glucose 88 70 - 100 mg/dL    BUN 04.5 7.0 - 40.9 mg/dL    Creatinine 0.7 0.6 - 1.0 mg/dL    Sodium 811 914 - 782 mEq/L    Potassium 3.5 3.5 - 5.1 mEq/L    Chloride 107 100 - 111 mEq/L    CO2 27 22 - 29 mEq/L    Calcium 9.5 8.5 - 10.5 mg/dL    Protein, Total 6.7 6.0 - 8.3 g/dL    Albumin 4.0 3.5 - 5.0 g/dL    AST (SGOT) 18 5 - 34 U/L    ALT 14 0 - 55 U/L    Alkaline Phosphatase 102 37 - 106 U/L    Bilirubin, Total 0.6 0.2 - 1.2 mg/dL    Globulin 2.7 2.0 - 3.6 g/dL    Albumin/Globulin Ratio 1.5 0.9 - 2.2    Anion Gap 8.0 5.0 - 15.0   Troponin I   Result Value Ref Range  Troponin I <0.01 0.00 - 0.09 ng/mL   GFR   Result Value Ref Range    EGFR >60.0    Troponin I   Result Value Ref Range    Troponin I <0.01 0.00 - 0.09 ng/mL   ECG 12 lead   Result Value Ref Range    Ventricular Rate 65 BPM    Atrial Rate 65 BPM    P-R Interval 158 ms    QRS Duration 80 ms    Q-T Interval 400 ms    QTC Calculation (Bezet) 416 ms    P Axis 50 degrees    R Axis -6 degrees    T Axis 26 degrees   ECG 12 lead   Result Value Ref Range    Ventricular Rate 65 BPM    Atrial Rate 65 BPM    P-R Interval 158 ms    QRS Duration 80 ms    Q-T Interval 400 ms    QTC Calculation (Bezet) 416 ms    P Axis 50 degrees    R Axis -6 degrees    T Axis 26 degrees       CRITICAL CARE/PROCEDURES    Procedures  DIAGNOSIS      Diagnosis:  Final diagnoses:   Chest pain in adult       Disposition:  ED Disposition     ED Disposition Condition Date/Time Comment    Discharge Boarder to Home  Sat Apr 03, 2017 11:47 AM           Prescriptions:  Discharge Medication List as of 04/03/2017 11:47 AM      START taking these medications    Details   sucralfate (CARAFATE) 1 g tablet Take 1 tablet (1 g total) by mouth 4 (four) times daily.for 14 days, Starting Sat 04/03/2017, Until Sat 04/17/2017, Print         CONTINUE these medications which have NOT CHANGED    Details   albuterol (PROVENTIL HFA;VENTOLIN HFA) 108 (90 BASE) MCG/ACT inhaler  Inhale into the lungs every 4 (four) hours as needed.   , Starting 01/26/2012, Until Discontinued, Historical Med      atenolol (TENORMIN) 50 MG tablet 1 TABLET AND HALF BY MOUTH DAILY, Normal      B Complex Vitamins (VITAMIN-B COMPLEX) Tab Take  by Mouth Once a Day., Historical Med      cholecalciferol (VITAMIN D-1000 MAX ST) 1000 units tablet Take 1,000 Units by Mouth Once a Day., Historical Med      desonide (DESOWEN) 0.05 % cream Apply topically 2 (two) times daily as needed., Starting 11/07/2014, Until Discontinued, Normal      diazePAM (VALIUM) 2 MG tablet Take 1 tablet (2 mg total) by mouth every 8 (eight) hours as needed for Anxiety., Starting Tue 11/03/2016, Print      docusate sodium (COLACE) 100 MG capsule Take 100 mg by mouth daily.    , Until Discontinued, Historical Med      fluticasone (FLONASE) 50 MCG/ACT nasal spray 2 sprays by Nasal route daily., Starting 01/02/2014, Until Discontinued, Normal      pantoprazole (PROTONIX) 40 MG tablet Take 1 tablet (40 mg total) by mouth daily., Starting Fri 11/06/2016, Normal      valACYclovir HCL (VALTREX) 500 MG tablet TAKE 1 TABLET(500 MG) BY MOUTH DAILY, Normal      vitamin A 21308 UNIT capsule Take 10,000 Units by Mouth Once a Day., Historical Med  Shela Nevin, MD  04/04/17 [phone]

## 2017-04-05 ENCOUNTER — Telehealth (INDEPENDENT_AMBULATORY_CARE_PROVIDER_SITE_OTHER): Payer: Self-pay

## 2017-04-05 NOTE — Telephone Encounter (Signed)
ED Visit Follow Up Call      Facilty:[facility]     Discharge Date:04/03/17    Primary Discharge Dx: chest pain in Adults    Prior ER Visits/Hospitalizations (past yr):0    Follow Up Appt with PCP/Specialist :   ___06_/__6_/__18__      Outside care ordered:  No    Patient Questions:    What symptoms made you go to the ED?: Pt stated,"My blood   pressure was up real high and I had chest pain ,nausea and dizziness.  How are you feeling today after your recent ED visit?:  "I'm feeling a lot better."  Do you have any new or worsening symptoms since being seen?  If so, describe your symptoms and what you have tried to relieve your symptoms.  No new or worsening sx's.  What instructions did they give you at discharge?:  Gave me  rx for carafate  and rest  Do you have your printed copy of your discharge instructions?:  Yes, I have a copy of my discharge.   What questions do you have regarding your instructions?  Pt has no questions regarding discharge instruction.   What new medications were prescribed at your visit or what changes to your medications were made?  Carafate was prescribed.   What questions do you have regarding your discharge medications?  No questions regarding medications   Do you have assistance at home and transportation to appointments?  yes      Advised to follow up as instructed.  Patient / family member verbalizes understanding and has no other questions or concerns.   Instructed to call back if symptoms change or worsen and/or go to the emergency room or call 911 for emergency symptoms.

## 2017-04-07 ENCOUNTER — Ambulatory Visit (INDEPENDENT_AMBULATORY_CARE_PROVIDER_SITE_OTHER): Payer: Commercial Managed Care - HMO | Admitting: Internal Medicine

## 2017-04-08 LAB — ECG 12-LEAD
Atrial Rate: 65 {beats}/min
P Axis: 50 degrees
P-R Interval: 158 ms
Q-T Interval: 400 ms
QRS Duration: 80 ms
QTC Calculation (Bezet): 416 ms
R Axis: -6 degrees
T Axis: 26 degrees
Ventricular Rate: 65 {beats}/min

## 2017-04-13 ENCOUNTER — Ambulatory Visit (INDEPENDENT_AMBULATORY_CARE_PROVIDER_SITE_OTHER): Payer: Commercial Managed Care - HMO | Admitting: Internal Medicine

## 2017-04-13 ENCOUNTER — Encounter (INDEPENDENT_AMBULATORY_CARE_PROVIDER_SITE_OTHER): Payer: Self-pay | Admitting: Internal Medicine

## 2017-04-13 VITALS — BP 180/95 | HR 57 | Temp 97.7°F | Wt 131.0 lb

## 2017-04-13 DIAGNOSIS — Z9884 Bariatric surgery status: Secondary | ICD-10-CM

## 2017-04-13 DIAGNOSIS — R197 Diarrhea, unspecified: Secondary | ICD-10-CM

## 2017-04-13 DIAGNOSIS — E785 Hyperlipidemia, unspecified: Secondary | ICD-10-CM

## 2017-04-13 DIAGNOSIS — I10 Essential (primary) hypertension: Secondary | ICD-10-CM

## 2017-04-13 MED ORDER — LISINOPRIL 5 MG PO TABS
5.0000 mg | ORAL_TABLET | Freq: Every day | ORAL | 5 refills | Status: DC
Start: 2017-04-13 — End: 2017-04-15

## 2017-04-13 NOTE — Progress Notes (Signed)
Have you seen any specialists/other providers since your last visit with us?      No      Arm preference verified?     No    The patient is due for mammogram and Shingrix Vaccine.

## 2017-04-13 NOTE — Progress Notes (Signed)
Subjective:       Patient ID: Amber [name] is a 66 y.o. female.    HPI  Pt c/o   Went to ED last week, for cp, GI upset, labs, EKG, CXR were normal,   +diarrhea once daily in AM, x 4d, bloating, gaseous, no n/v, no BRBPR,  Been eating fried food,   rx'd carafate by ED, unable to take due to SE,  HTN, fluctuating at home 130-160's/90's,   Previous cardiac evaluation neg including  cardiac cath ( 01/08/14 - normal )  echocardiogram ( EF 55%, mild TR)  24 hour holter - no significant events    The following portions of the patient's history were reviewed and updated as appropriate: allergies, current medications, past family history, past medical history, past social history, past surgical history and problem list.    Review of Systems   Constitutional: Positive for appetite change and fatigue. Negative for chills, fever and unexpected weight change.   Respiratory: Negative for shortness of breath.    Cardiovascular: Negative for chest pain and palpitations.   Gastrointestinal: Negative for abdominal pain and blood in stool.   Genitourinary: Negative for dysuria and hematuria.   Neurological: Negative for syncope.     BP (!) 180/95   Pulse (!) 57   Temp 97.7 F (36.5 C) (Oral)   Wt 59.4 kg (131 lb)   BMI 24.75 kg/m        Objective:    Physical Exam   Constitutional: She appears well-developed. No distress.   HENT:   Mouth/Throat: Oropharynx is clear and moist. No oropharyngeal exudate.   Eyes: Conjunctivae are normal. No scleral icterus.   Neck: Neck supple. No JVD present. Carotid bruit is not present. No thyromegaly present.   Cardiovascular: Normal rate, regular rhythm and normal heart sounds.  Exam reveals no gallop and no friction rub.    No murmur heard.  Pulmonary/Chest: Effort normal and breath sounds normal. No respiratory distress. She has no wheezes. She has no rales.   Abdominal: Soft. She exhibits no distension and no mass. Bowel sounds are increased. There is no tenderness. There is no rebound  and no guarding.   Musculoskeletal: She exhibits no edema.   Neurological: She is alert.           Assessment:       1. Essential hypertension  lisinopril (PRINIVIL,ZESTRIL) 5 MG tablet   2. Diarrhea, unspecified type     3. Hyperlipidemia, unspecified hyperlipidemia type     4. Bariatric surgery status            Plan:      Procedures  No orders of the defined types were placed in this encounter.    Medications Ordered This Encounter       Disp Refills Start End    lisinopril (PRINIVIL,ZESTRIL) 5 MG tablet 30 tablet 5 04/13/2017 04/13/2018    Take 1 tablet (5 mg total) by mouth daily. - Oral        Bland diet, oral hydration  F/u GI, [name]  Continue meds  rx lisinopril 5mg    Monitor BP  F/u 4 weeks or sooner prn

## 2017-04-15 ENCOUNTER — Encounter (INDEPENDENT_AMBULATORY_CARE_PROVIDER_SITE_OTHER): Payer: Self-pay | Admitting: Internal Medicine

## 2017-04-15 ENCOUNTER — Other Ambulatory Visit (INDEPENDENT_AMBULATORY_CARE_PROVIDER_SITE_OTHER): Payer: Self-pay | Admitting: Internal Medicine

## 2017-04-15 DIAGNOSIS — I10 Essential (primary) hypertension: Secondary | ICD-10-CM

## 2017-04-15 MED ORDER — AMLODIPINE BESYLATE 5 MG PO TABS
5.0000 mg | ORAL_TABLET | Freq: Every day | ORAL | 5 refills | Status: DC
Start: 2017-04-15 — End: 2017-10-13

## 2017-05-02 ENCOUNTER — Encounter (INDEPENDENT_AMBULATORY_CARE_PROVIDER_SITE_OTHER): Payer: Self-pay | Admitting: Surgery

## 2017-05-02 ENCOUNTER — Other Ambulatory Visit (INDEPENDENT_AMBULATORY_CARE_PROVIDER_SITE_OTHER): Payer: Self-pay | Admitting: Internal Medicine

## 2017-05-02 DIAGNOSIS — Z9884 Bariatric surgery status: Secondary | ICD-10-CM

## 2017-05-02 DIAGNOSIS — K5901 Slow transit constipation: Secondary | ICD-10-CM

## 2017-05-02 DIAGNOSIS — G459 Transient cerebral ischemic attack, unspecified: Secondary | ICD-10-CM

## 2017-05-02 DIAGNOSIS — N3281 Overactive bladder: Secondary | ICD-10-CM

## 2017-05-02 DIAGNOSIS — D352 Benign neoplasm of pituitary gland: Secondary | ICD-10-CM

## 2017-05-02 DIAGNOSIS — M170 Bilateral primary osteoarthritis of knee: Secondary | ICD-10-CM

## 2017-05-02 DIAGNOSIS — G4733 Obstructive sleep apnea (adult) (pediatric): Secondary | ICD-10-CM

## 2017-05-02 DIAGNOSIS — R0789 Other chest pain: Secondary | ICD-10-CM

## 2017-05-02 DIAGNOSIS — E559 Vitamin D deficiency, unspecified: Secondary | ICD-10-CM

## 2017-05-06 ENCOUNTER — Ambulatory Visit (INDEPENDENT_AMBULATORY_CARE_PROVIDER_SITE_OTHER): Payer: Commercial Managed Care - HMO | Admitting: Internal Medicine

## 2017-05-06 LAB — PTH, INTACT: PARATHYROID HORMONE INTACT: 28 pg/mL (ref 14–64)

## 2017-05-06 LAB — LIPID PANEL
Cholesterol / HDL Ratio: 2.1 (calc) (ref <5.0)
Cholesterol: 187 mg/dL (ref <200)
HDL: 90 mg/dL (ref >50)
LDL Calculated: 83 mg/dL (calc)
NON HDL CHOLESTEROL: 97 mg/dL (calc) (ref <130)
Triglycerides: 55 mg/dL (ref <150)

## 2017-05-06 LAB — CBC
Hematocrit: 40.1 % (ref 35.0–45.0)
Hemoglobin: 12.9 g/dL (ref 11.7–15.5)
MCH: 27.6 pg (ref 27.0–33.0)
MCHC: 32.2 g/dL (ref 32.0–36.0)
MCV: 85.7 fL (ref 80.0–100.0)
MPV: 10.4 fL (ref 7.5–12.5)
Platelets: 284 10*3/uL (ref 140–400)
RBC: 4.68 10*6/uL (ref 3.80–5.10)
RDW: 14.5 % (ref 11.0–15.0)
WBC: 8.7 10*3/uL (ref 3.8–10.8)

## 2017-05-06 LAB — COMPREHENSIVE METABOLIC PANEL
ALT: 13 U/L (ref 6–29)
AST (SGOT): 14 U/L (ref 10–35)
Albumin/Globulin Ratio: 1.6 (calc) (ref 1.0–2.5)
Albumin: 4.1 g/dL (ref 3.6–5.1)
Alkaline Phosphatase: 85 U/L (ref 33–130)
BUN: 13 mg/dL (ref 7–25)
Bilirubin, Total: 0.4 mg/dL (ref 0.2–1.2)
CO2: 27 mmol/L (ref 20–31)
Calcium: 9.6 mg/dL (ref 8.6–10.4)
Chloride: 106 mmol/L (ref 98–110)
Creatinine: 0.55 mg/dL (ref 0.50–0.99)
EGFR African American: 115 mL/min/{1.73_m2} (ref >=60)
Globulin: 2.5 g/dL (calc) (ref 1.9–3.7)
Glucose: 94 mg/dL (ref 65–99)
NON-AFRICAN AMERICA EGFR: 99 mL/min/{1.73_m2} (ref >=60)
Potassium: 3.9 mmol/L (ref 3.5–5.3)
Protein, Total: 6.6 g/dL (ref 6.1–8.1)
Sodium: 144 mmol/L (ref 135–146)

## 2017-05-06 LAB — IRON PROFILE
Iron Saturation: 10 % (calc) — ABNORMAL LOW (ref 11–50)
Iron: 37 ug/dL — ABNORMAL LOW (ref 45–160)
TIBC: 371 mcg/dL (calc) (ref 250–450)

## 2017-05-06 LAB — VITAMIN D-25 HYDROXY (D2/D3/TOTAL)
Vitamin D, 25-OH, D2: 5 ng/mL
Vitamin D, 25-OH, D3: 28 ng/mL
Vitamin D, 25-OH, Total: 33 ng/mL (ref 30–100)

## 2017-05-06 LAB — VITAMIN A: Vitamin A: 40 ug/dL (ref 38–98)

## 2017-05-06 LAB — COPPER, SERUM: Copper: 116 ug/dL (ref 70–175)

## 2017-05-06 LAB — TSH: TSH: 1.04 mIU/L (ref 0.40–4.50)

## 2017-05-06 LAB — HEMOGLOBIN A1C: Hemoglobin A1C: 5 % of total Hgb (ref <5.7)

## 2017-05-06 LAB — VITAMIN B1, PLASMA: Vitamin B1 (Thiamine): 7 nmol/L — ABNORMAL LOW (ref 8–30)

## 2017-05-06 LAB — VITAMIN B12: Vitamin B-12: 353 pg/mL (ref 200–1100)

## 2017-05-06 LAB — FOLATE: Folate: 7.4 ng/mL

## 2017-05-12 ENCOUNTER — Ambulatory Visit (INDEPENDENT_AMBULATORY_CARE_PROVIDER_SITE_OTHER): Payer: Commercial Managed Care - HMO | Admitting: Family Medicine

## 2017-05-19 ENCOUNTER — Encounter (INDEPENDENT_AMBULATORY_CARE_PROVIDER_SITE_OTHER): Payer: Self-pay | Admitting: Family Medicine

## 2017-05-19 ENCOUNTER — Ambulatory Visit (INDEPENDENT_AMBULATORY_CARE_PROVIDER_SITE_OTHER): Payer: Commercial Managed Care - HMO | Admitting: Family Medicine

## 2017-05-19 VITALS — BP 147/83 | HR 60 | Temp 98.1°F | Ht 61.0 in | Wt 130.0 lb

## 2017-05-19 DIAGNOSIS — Z9884 Bariatric surgery status: Secondary | ICD-10-CM

## 2017-05-19 DIAGNOSIS — Z1321 Encounter for screening for nutritional disorder: Secondary | ICD-10-CM

## 2017-05-19 NOTE — Patient Instructions (Signed)
Please start taking a multivitamin 2x daily (if you are going to use flinstone vitamins, take 2 daily)    calcium 3x/ day; b-50 complex 2 capsules/day; and b-12  Every other day

## 2017-05-19 NOTE — Progress Notes (Signed)
Chief Complaint   Patient presents with   . 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  Body mass index is 24.56 kg/m.Weight: 130 lb   Wt Readings from Last 10 Encounters:   05/19/17 130 lb   04/13/17 131 lb   04/03/17 125 lb   02/04/17 135 lb 9.6 oz   02/02/17 132 lb   01/07/17 139 lb   01/05/17 130 lb   01/02/17 130 lb   11/12/16 135 lb   11/03/16 136 lb 12.8 oz        Amber [name]  is doing well with       88    lbs  total weight loss since surgery.  Notes a bit of gas as well.   Notes now that food feels stuck, esp with medicine. Seen by gi and evaluated.   is compliant with portion control eating a bit more.   Non Compliant with vitamin and protein supplementation states makes her sick.    Amber [name]  is tolerating a regular diet and denies any nausea, vomiting, reflux, or abdominal pain.  Amber [name]  is drinking at least 64 oz of fluids.  her  energy level is good.           The following portions of the patient's history were reviewed and updated as appropriate: allergies, current medications, past family history, past medical history, past social history, past surgical history and problem list.    ROS: as in HPI       Patient Active Problem List   Diagnosis   . Essential hypertension   . Anxiety state, unspecified   . Abnormal electrocardiogram   . Cerebral infarction   . Gastroesophageal reflux disease   . Hypernatremia   . Paresthesia   . Slow transit constipation   . Vitamin D deficiency   . Overactive bladder   . S/P laparoscopic cholecystectomy   . Transient cerebral ischemia, unspecified type   . Palpitations   . Pituitary microadenoma   . Dyslipidemia   . Other long term (current) drug therapy   . Headache   . History of spinal surgery   . Obstructive sleep apnea syndrome   . Osteoarthritis of knees, bilateral   . Pre-op evaluation   . Bariatric surgery status   . Nutritional deficiency   .  Anxiety   . Epigastric abdominal tenderness without rebound tenderness   . Other chest pain   . Abdominal pain     Allergies   Allergen Reactions   . Acyclovir Nausea And Vomiting   . Amoxicillin-Pot Clavulanate      Other reaction(s): gi distress   . Aspirin Nausea And Vomiting     Other reaction(s): gi distress  Upsets stomach  Able to tolerate enteric coated aspirin   . Atorvastatin      Palpitation   . Lovastatin Nausea And Vomiting   . Metronidazole Nausea And Vomiting   . Moxifloxacin Nausea And Vomiting     GI symptoms   . Other Nausea And Vomiting   . Rosuvastatin      Palpitation, chest pain, abd pain    . Statins    . Sulfa Antibiotics Nausea And Vomiting     Other reaction(s): gi distress     Problem list and Allergies reviewed.  Past Medical History:   Diagnosis Date   . Chest pain 2016     Chest pain after eating x6  months--being followed by GI for GERD   . Constipation    . Difficulty walking     amb with cane secondary to arthritis bil knees   . Genital herpes     no current outbreak (10/03/15)   . GERD (gastroesophageal reflux disease)    . Hiatal hernia    . Hyperlipidemia    . Hypertensive disorder     Well controlled on med. Denies any SOB in last 6 months (10/03/15)   . Morbid obesity with BMI of 40.0-44.9, adult     BMI 40.9   . Nausea without vomiting    . OSA on CPAP 2015    CPAP nightly x 1 yr   . Osteoarthritis of knees, bilateral    . TIA (transient ischemic attack) 2014    TIA 2014-no residual. Patient evaulated for possible TIA 07/12/2015  @ [facility] (dischage summary in epic)-per summary CT of head was normal and patient was d/c'd     Past Surgical History:   Procedure Laterality Date   . APPENDECTOMY  >25 yrs   . EGD  01/2015   . fallopian tube surgery  >25 yrs   . INSERTION, PAIN PUMP (MEDICAL) N/A 10/22/2015    Procedure: INSERTION, PAIN PUMP (MEDICAL);  Surgeon: Nicola Police, DO;  Location: [place] [facility];  Service: General;  Laterality: N/A;   . LAPAROSCOPIC,  CHOLECYSTECTOMY, CHOLANGIOGRAM  08/13/2014   . LAPAROSCOPIC, GASTRIC BYPASS N/A 10/22/2015    Procedure: LAPAROSCOPIC, GASTRIC BYPASS;  Surgeon: Josefa Half R, DO;  Location: [place] [facility];  Service: General;  Laterality: N/A;   . LAPAROSCOPIC, HERNIORRHAPHY, HIATAL N/A 10/22/2015    Procedure: LAPAROSCOPIC, HERNIORRHAPHY, HIATAL;  Surgeon: Jeanell Sparrow, Hamid R, DO;  Location: [place] [facility];  Service: General;  Laterality: N/A;   . OVARY SURGERY Right >25 yrs   . SPINE SURGERY  11/2013    cervical HNP x 2, Dr. Bufford Buttner     Family History   Problem Relation Age of Onset   . Cancer Mother 74     breast cancer     Social History     Social History Narrative   . No narrative on file     Patient Care Team:  Oneita Jolly, MD as PCP - General (Internal Medicine)  Donley Redder Tyrone Nine, MD as Consulting Physician (Cardiology)  Janace Litten, MD as Consulting Physician (Cardiology)  Darnelle Maffucci, MD as Consulting Physician (Cardiology)  Eleonore Chiquito, [place]  Burton Apley, MD as Consulting Physician (Gastroenterology)  Brett Canales, MD as Consulting Physician (Gastroenterology)  Karrie Doffing, RD as Dietitian (Dietician)  Karrie Doffing, RD as Dietitian (Dietician)  Karrie Doffing, RD as Dietitian (Dietician)  Renard Hamper, MD as Consulting Physician (Clinical Cardiac Electrophysiology)  Michele Mcalpine, RN as Registered Nurse          Current Outpatient Prescriptions:   .  albuterol (PROVENTIL HFA;VENTOLIN HFA) 108 (90 BASE) MCG/ACT inhaler, Inhale into the lungs every 4 (four) hours as needed.  , Disp: , Rfl:   .  amLODIPine (NORVASC) 5 MG tablet, Take 1 tablet (5 mg total) by mouth daily., Disp: 30 tablet, Rfl: 5  .  atenolol (TENORMIN) 50 MG tablet, 1 TABLET AND HALF BY MOUTH DAILY, Disp: 135 tablet, Rfl: 1  .  B Complex Vitamins (VITAMIN-B COMPLEX) Tab, Take  by Mouth Once a Day., Disp: , Rfl:   .  cholecalciferol (VITAMIN D-1000 MAX ST) 1000 units tablet, Take 1,000 Units by Mouth  Once a Day., Disp: , Rfl:   .  desonide (DESOWEN) 0.05 % cream, Apply topically 2 (two) times daily as needed., Disp: 60 g, Rfl: 2  .  diazePAM (VALIUM) 2 MG tablet, Take 1 tablet (2 mg total) by mouth every 8 (eight) hours as needed for Anxiety., Disp: 30 tablet, Rfl: 2  .  docusate sodium (COLACE) 100 MG capsule, Take 100 mg by mouth daily.  , Disp: , Rfl:   .  fluticasone (FLONASE) 50 MCG/ACT nasal spray, 2 sprays by Nasal route daily. (Patient taking differently: 2 sprays by Nasal route as needed.  ), Disp: 16 g, Rfl: 2  .  pantoprazole (PROTONIX) 40 MG tablet, TAKE 1 TABLET(40 MG) BY MOUTH DAILY, Disp: 90 tablet, Rfl: 0  .  valACYclovir HCL (VALTREX) 500 MG tablet, TAKE 1 TABLET(500 MG) BY MOUTH DAILY, Disp: 90 tablet, Rfl: 0  .  vitamin A 16109 UNIT capsule, Take 10,000 Units by Mouth Once a Day., Disp: , Rfl:   Family and Social history reviewed.   Medications reviewed         BP 147/83   Pulse 60   Temp 98.1 F (36.7 C) (Oral)   Ht 5\' 1"    Wt 130 lb   BMI 24.56 kg/m    Vitals:    05/19/17 0800   BP: 147/83   Pulse: 60   Temp: 98.1 F (36.7 C)   TempSrc: Oral   Weight: 130 lb   Height: 5\' 1"        Nursing note reviewed. Vital signs reviewed.   Constitutional: Oriented to person, place, and time. Well-developed and well-nourished. No distress  HENT:   Head: Normocephalic and atraumatic.   Eyes: EOM are normal. Pupils are equal, round, and reactive to light.   Abdominal: Soft. Bowel sounds are normal. No distension. There is sl tenderness RUQ.  There is no rebound and no guarding.  Musculoskeletal: Normal range of motion.  Neurological: Alert and oriented to person, place, and time.   Skin: Skin is warm and dry.   Psychiatric: Normal mood and affect. Behavior is normal.         Assessment and Plan:  Reganne was seen today for bypass follow-up.    Diagnoses and all orders for this visit:    Bariatric surgery status  -     CBC without differential  -     Comprehensive metabolic panel  -     Iron  -      Transferrin  -     Ferritin  -     Vitamin D,25 OH, Total  -     PTH, Intact  -     Copper, serum  -     Vitamin B1, Plasma  -     Vitamin B12  -     Lipid panel    Encounter for vitamin deficiency screening  -     CBC without differential  -     Comprehensive metabolic panel  -     Iron  -     Transferrin  -     Ferritin  -     Vitamin D,25 OH, Total  -     PTH, Intact  -     Copper, serum  -     Vitamin B1, Plasma  -  Vitamin B12  -     Lipid panel        Bariatric Surgery Status-   Pt is    19    Mos post op        Doing well. 88 lb weight loss.   Non compliant with MVI. Limiting portion sizes, consistent with PA.   Reviewed prev labs. Vit a , thiamine ,  iron low 2nd to non compliance. Advised mvi bid, b complex 2 capsules daily, vit d/ca 3x daily, b12 every other day.  Obtain routine labs for NOV in 6 mos due to noncompliance.   FU for routine care    Follow up with PCP regularly and gi re: dysphagia, and ibs symptoms (previously addressed).          Return in about 6 months (around 11/19/2017).

## 2017-05-20 ENCOUNTER — Encounter (INDEPENDENT_AMBULATORY_CARE_PROVIDER_SITE_OTHER): Payer: Self-pay | Admitting: Family Medicine

## 2017-06-05 ENCOUNTER — Emergency Department: Payer: Commercial Managed Care - HMO

## 2017-06-05 ENCOUNTER — Emergency Department
Admission: EM | Admit: 2017-06-05 | Discharge: 2017-06-05 | Disposition: A | Discharge status: Home / Self Care | Payer: Commercial Managed Care - HMO | Attending: Emergency Medicine | Admitting: Emergency Medicine

## 2017-06-05 DIAGNOSIS — E785 Hyperlipidemia, unspecified: Secondary | ICD-10-CM | POA: Insufficient documentation

## 2017-06-05 DIAGNOSIS — K920 Hematemesis: Secondary | ICD-10-CM | POA: Insufficient documentation

## 2017-06-05 DIAGNOSIS — G4489 Other headache syndrome: Secondary | ICD-10-CM | POA: Insufficient documentation

## 2017-06-05 DIAGNOSIS — Z9884 Bariatric surgery status: Secondary | ICD-10-CM | POA: Insufficient documentation

## 2017-06-05 DIAGNOSIS — Z8673 Personal history of transient ischemic attack (TIA), and cerebral infarction without residual deficits: Secondary | ICD-10-CM | POA: Insufficient documentation

## 2017-06-05 DIAGNOSIS — K449 Diaphragmatic hernia without obstruction or gangrene: Secondary | ICD-10-CM | POA: Insufficient documentation

## 2017-06-05 DIAGNOSIS — K219 Gastro-esophageal reflux disease without esophagitis: Secondary | ICD-10-CM | POA: Insufficient documentation

## 2017-06-05 DIAGNOSIS — I10 Essential (primary) hypertension: Secondary | ICD-10-CM | POA: Insufficient documentation

## 2017-06-05 DIAGNOSIS — Z79899 Other long term (current) drug therapy: Secondary | ICD-10-CM | POA: Insufficient documentation

## 2017-06-05 LAB — COMPREHENSIVE METABOLIC PANEL
ALT: 15 U/L (ref 0–55)
AST (SGOT): 18 U/L (ref 5–34)
Albumin/Globulin Ratio: 1.4 (ref 0.9–2.2)
Albumin: 3.8 g/dL (ref 3.5–5.0)
Alkaline Phosphatase: 96 U/L (ref 37–106)
Anion Gap: 7 (ref 5.0–15.0)
BUN: 13 mg/dL (ref 7.0–19.0)
Bilirubin, Total: 0.3 mg/dL (ref 0.2–1.2)
CO2: 26 mEq/L (ref 22–29)
Calcium: 9.1 mg/dL (ref 8.5–10.5)
Chloride: 108 mEq/L (ref 100–111)
Creatinine: 0.7 mg/dL (ref 0.6–1.0)
Globulin: 2.7 g/dL (ref 2.0–3.6)
Glucose: 89 mg/dL (ref 70–100)
Potassium: 3.9 mEq/L (ref 3.5–5.1)
Protein, Total: 6.5 g/dL (ref 6.0–8.3)
Sodium: 141 mEq/L (ref 136–145)

## 2017-06-05 LAB — CBC AND DIFFERENTIAL
Absolute NRBC: 0 10*3/uL
Basophils Absolute Automated: 0.01 10*3/uL (ref 0.00–0.20)
Basophils Automated: 0.2 %
Eosinophils Absolute Automated: 0.08 10*3/uL (ref 0.00–0.70)
Eosinophils Automated: 1.6 %
Hematocrit: 37.1 % (ref 37.0–47.0)
Hgb: 12.2 g/dL (ref 12.0–16.0)
Immature Granulocytes Absolute: 0 10*3/uL
Immature Granulocytes: 0 %
Lymphocytes Absolute Automated: 1.87 10*3/uL (ref 0.50–4.40)
Lymphocytes Automated: 37.5 %
MCH: 28.1 pg (ref 28.0–32.0)
MCHC: 32.9 g/dL (ref 32.0–36.0)
MCV: 85.5 fL (ref 80.0–100.0)
MPV: 10.2 fL (ref 9.4–12.3)
Monocytes Absolute Automated: 0.53 10*3/uL (ref 0.00–1.20)
Monocytes: 10.6 %
Neutrophils Absolute: 2.5 10*3/uL (ref 1.80–8.10)
Neutrophils: 50.1 %
Nucleated RBC: 0 /100 WBC (ref 0.0–1.0)
Platelets: 279 10*3/uL (ref 140–400)
RBC: 4.34 10*6/uL (ref 4.20–5.40)
RDW: 16 % — ABNORMAL HIGH (ref 12–15)
WBC: 4.99 10*3/uL (ref 3.50–10.80)

## 2017-06-05 LAB — ECG 12-LEAD
Atrial Rate: 68 {beats}/min
P Axis: 47 degrees
P-R Interval: 168 ms
Q-T Interval: 376 ms
QRS Duration: 84 ms
QTC Calculation (Bezet): 399 ms
R Axis: -4 degrees
T Axis: 24 degrees
Ventricular Rate: 68 {beats}/min

## 2017-06-05 LAB — URINALYSIS
Bilirubin, UA: NEGATIVE
Blood, UA: NEGATIVE
Glucose, UA: NEGATIVE
Ketones UA: NEGATIVE
Leukocyte Esterase, UA: NEGATIVE
Nitrite, UA: NEGATIVE
Protein, UR: NEGATIVE
Specific Gravity UA: 1.005 (ref 1.001–1.035)
Urine pH: 8 (ref 5.0–8.0)
Urobilinogen, UA: <2 mg/dL

## 2017-06-05 LAB — LIPASE: Lipase: 14 U/L (ref 8–78)

## 2017-06-05 LAB — MAGNESIUM: Magnesium: 2.2 mg/dL (ref 1.6–2.6)

## 2017-06-05 LAB — PT AND APTT
PT INR: 1 (ref 0.9–1.1)
PT: 13.4 s (ref 12.6–15.0)
PTT: 29 s (ref 23–37)

## 2017-06-05 LAB — I-STAT LACTIC ACID: Lactic Acid I-Stat: 0.7 mmol/L (ref 0.2–2.0)

## 2017-06-05 LAB — GFR: EGFR: >60

## 2017-06-05 LAB — TROPONIN I: Troponin I: <0.01 ng/mL (ref 0.00–0.09)

## 2017-06-05 LAB — PHOSPHORUS: Phosphorus: 3.5 mg/dL (ref 2.3–4.7)

## 2017-06-05 MED ORDER — IOHEXOL 350 MG/ML IV SOLN
100.0000 mL | Freq: Once | INTRAVENOUS | Status: AC | PRN
Start: 2017-06-05 — End: 2017-06-05
  Administered 2017-06-05: 20:00:00 100 mL via INTRAVENOUS

## 2017-06-05 MED ORDER — MAGNESIUM SULFATE IN D5W 1-5 GM/100ML-% IV SOLN
1.0000 g | Freq: Once | INTRAVENOUS | Status: AC
Start: 2017-06-05 — End: 2017-06-05
  Administered 2017-06-05: 20:00:00 1 g via INTRAVENOUS
  Filled 2017-06-05: qty 100

## 2017-06-05 MED ORDER — SODIUM CHLORIDE 0.9 % IV BOLUS
1000.0000 mL | Freq: Once | INTRAVENOUS | Status: AC
Start: 2017-06-05 — End: 2017-06-05
  Administered 2017-06-05: 19:00:00 1000 mL via INTRAVENOUS

## 2017-06-05 MED ORDER — BUTALBITAL-APAP-CAFFEINE 50-325-40 MG PO TABS
1.0000 | ORAL_TABLET | Freq: Four times a day (QID) | ORAL | 0 refills | Status: DC | PRN
Start: 2017-06-05 — End: 2017-06-14

## 2017-06-05 MED ORDER — PANTOPRAZOLE SODIUM 40 MG IV SOLR
80.0000 mg | Freq: Every day | INTRAVENOUS | Status: DC
Start: 2017-06-05 — End: 2017-06-05
  Administered 2017-06-05: 19:00:00 80 mg via INTRAVENOUS
  Filled 2017-06-05: qty 80

## 2017-06-05 MED ORDER — PROMETHAZINE HCL 25 MG/ML IJ SOLN
12.5000 mg | Freq: Once | INTRAMUSCULAR | Status: AC
Start: 2017-06-05 — End: 2017-06-05
  Administered 2017-06-05: 19:00:00 12.5 mg via INTRAVENOUS
  Filled 2017-06-05: qty 1

## 2017-06-05 MED ORDER — FAMOTIDINE 20 MG PO TABS
20.0000 mg | ORAL_TABLET | Freq: Two times a day (BID) | ORAL | 0 refills | Status: DC
Start: 2017-06-05 — End: 2017-11-09

## 2017-06-05 MED ORDER — DICYCLOMINE HCL 10 MG PO CAPS
10.0000 mg | ORAL_CAPSULE | Freq: Once | ORAL | Status: AC
Start: 2017-06-05 — End: 2017-06-05
  Administered 2017-06-05: 20:00:00 10 mg via ORAL
  Filled 2017-06-05: qty 1

## 2017-06-05 NOTE — EDIE (Signed)
Amber [name]?NOTIFICATION?06/05/2017 18:15?KALENE, CUTLER F?MRN: [id number]    This patient has registered at the [facility] Emergency Room [facility] Emergency Department   For more information visit: [email or web address]   Criteria met      5 ED Visits in 12 Months    Security Events  No recent Security Events currently on file    ED Care Guidelines  There are currently no ED Care Guidelines in [name] for this patient. Please check your facility's medical records system.    Care Providers  Dawon Troop has no care providers on record at this time.   E.D. Visit Count (12 mo.)  Facility Visits   [facility] 5   [facility] 1   [place] [facility] 1   Total 7   Note: Visits indicate total known visits.      Recent Emergency Department Visit Summary  Admit Date Facility [facility] Type Major Type Diagnoses or Chief Complaint   Jun 05, 2017 [facility] [place] [facility]      Headache, throbbing pain all over, light headed, nauseated      Apr 03, 2017 [facility]      Chest pain, unspecified      Acquired absence of other specified parts of digestive tract          Recent Inpatient Visit Summary  No recorded inpatient visits.         The above information is provided for the sole purpose of patient treatment. Use of this information beyond the terms of Data Sharing Memorandum of Understanding and License Agreement is prohibited. In certain cases not all visits may be represented. Consult the aforementioned facilities for additional information.   ? 2018 [employer] - [place], [place] - [email or web address]

## 2017-06-05 NOTE — Discharge Instructions (Signed)
Headache    You have been treated for a headache.    Headaches are very common. Most of the time they are benign (not harmful). Some headaches can be very serious. Your headache appears to be benign. The doctor feels it is OK for you to go home.    If you continue to have headaches, or if this headache does not resolve over the next few days, you should be evaluated by your regular doctor or a neurologist. Keep a "headache diary." This may help your doctor learn the cause of your headaches.    Take your headache medication as directed. This is especially important if your doctor has placed you on a daily medication to prevent headaches.    YOU SHOULD SEEK MEDICAL ATTENTION IMMEDIATELY, EITHER HERE OR AT THE NEAREST EMERGENCY DEPARTMENT, IF ANY OF THE FOLLOWING OCCURS:   Your headache gets worse.   You have a severe headache that occurs suddenly.   Your head pain is different from your normal headache.   You have a fever (temperature higher than 100.4F / 38C), especially with a stiff neck.   You feel numbness, tingling, or weakness in your arms or legs.   You pass out.   You have problems with your vision.   You vomit and have trouble taking medication or keeping it down.              Hematemesis (NOS)    You were seen for hematemesis    Hematemesis is the medical term for vomiting (throwing up) blood. This blood often comes from your stomach or esophagus. The esophagus is the tube in your body that brings food to your stomach. When you throw up blood, the color might be bright, dark red. It might also be black and look like coffee grounds. Bright red blood means that the blood is fresh. Darker blood is often blood that is older and that has been sitting. This suggests that the bleeding was not recent.    Hematemesis can have many different causes. One of the most common is an ulcer, which is a tear in the lining of your stomach. An ulcer can cause blood to ooze into your stomach, which you then  throw up. Hematemesis also happens in people who drink a lot of alcohol. This is because alcohol irritates the lining of the stomach, which then causes small amounts of bleeding. This is called gastritis. Hematemesis also happens in people with livers that do not work very well because of cirrhosis. Cirrhosis is a scarring of the liver. The bleeding happens because of high pressure in the blood vessels of the esophagus. Finally, bleeding can start after people have already been throwing up. This is due to small tears that get created in the surface of the esophagus, called Mallory-Weiss tears.    Sometimes the only symptom of hematemesis is vomiting of blood. At other times, there might also be belly pain or bloody or black stool (poop). When you are losing a lot of blood, there might also be changes to your vital signs. These changes might be low blood pressure or a high heart rate. Lab tests often show low levels of hemoglobin. This means that you have fewer red blood cells in your body than normal.    To find the cause of your hematemesis, you might undergo a procedure called esophago-gastro-duodenoscopy (EGD for short). During an EGD, a tube is inserted into your duodenum (small intestine) through your mouth, esophagus, and stomach. The tube   has a camera attached to it. With the camera, your doctor can see the your duodenum and find where you are bleeding. A machine can then cauterize (burn) your blood vessels to stop the bleeding. Sometimes, the bleeding has already stopped by the time the EGD happens.    Don't drink alcohol or take any over-the-counter anti-inflammatory medicines like ibuprofen, naproxen, or aspirin. Unless you have liver disease, it is okay to take acetaminophen (Tylenol) for pain.    Follow up with your primary doctor or GI specialist doctor.    We don't believe your condition is serious right now, but it is important to be careful. Sometimes a problem that seems small can get serious  later. This is why it is very important to come back here or go to the nearest Emergency Department if you don't get better or if your symptoms get worse.    YOU SHOULD SEEK MEDICAL ATTENTION IMMEDIATELY, EITHER HERE OR AT THE NEAREST EMERGENCY DEPARTMENT, IF ANY OF THE FOLLOWING OCCUR:     You keep on vomiting (throwing up) blood.   You have bright red, dark red, or black stool (poop).   You have bad abdominal (belly) pain.   You feel lightheaded, dizzy, or weak overall.    If you can t follow up with your doctor, or if at any time you feel you need to be rechecked or seen again, come back here or go to the nearest emergency department.

## 2017-06-05 NOTE — ED Triage Notes (Signed)
Pt stated having left frontal HA, left facial tingling, dizziness, and nausea starting 0930 this morning. Pt has equal extremity sensation and strength. Pt alert and oriented x4 unlabored breathing. Pt also stated having diarrhea "dumping".

## 2017-06-05 NOTE — ED Provider Notes (Signed)
EMERGENCY DEPARTMENT NOTE    Physician/Midlevel provider first contact with patient: 06/05/17 1827         HISTORY OF PRESENT ILLNESS   Historian:Patient  Translator Used: No    Chief Complaint: Dizziness and Headache     Mechanism of Injury:       66 y.o. female with h/o bariatric surgery presents to the ED complaining of nausea, lightheadedness, L sided HA and facial numbness which has resolved.  Patient reports symptoms started at 0930 this morning.  No fevers.  On arrival to the ED the patient has a small amount of BRB in vomit.     1. Location of symptoms: see above  2. Onset of symptoms: 9 hours PTA  3. What was patient doing when symptoms started (Context): see above  4. Severity: moderate  5. Timing: constant  6. Activities that worsen symptoms: nothing  7. Activities that improve symptoms: nothing  8. Quality: sore  9. Radiation of symptoms: no  10. Associated signs and Symptoms: see above  11. Are symptoms worsening? yes  MEDICAL HISTORY     Past Medical History:  Past Medical History:   Diagnosis Date   . Chest pain 2016     Chest pain after eating x6  months--being followed by GI for GERD   . Constipation    . Difficulty walking     amb with cane secondary to arthritis bil knees   . Genital herpes     no current outbreak (10/03/15)   . GERD (gastroesophageal reflux disease)    . Hiatal hernia    . Hyperlipidemia    . Hypertensive disorder     Well controlled on med. Denies any SOB in last 6 months (10/03/15)   . Morbid obesity with BMI of 40.0-44.9, adult     BMI 40.9   . Nausea without vomiting    . OSA on CPAP 2015    CPAP nightly x 1 yr   . Osteoarthritis of knees, bilateral    . TIA (transient ischemic attack) 2014    TIA 2014-no residual. Patient evaulated for possible TIA 07/12/2015  @ [facility] (dischage summary in epic)-per summary CT of head was normal and patient was d/c'd       Past Surgical History:  Past Surgical History:   Procedure Laterality Date   . APPENDECTOMY  >25 yrs   . EGD  01/2015   .  fallopian tube surgery  >25 yrs   . INSERTION, PAIN PUMP (MEDICAL) N/A 10/22/2015    Procedure: INSERTION, PAIN PUMP (MEDICAL);  Surgeon: Nicola Police, DO;  Location: [facility];  Service: General;  Laterality: N/A;   . LAPAROSCOPIC, CHOLECYSTECTOMY, CHOLANGIOGRAM  08/13/2014   . LAPAROSCOPIC, GASTRIC BYPASS N/A 10/22/2015    Procedure: LAPAROSCOPIC, GASTRIC BYPASS;  Surgeon: Josefa Half R, DO;  Location: [facility];  Service: General;  Laterality: N/A;   . LAPAROSCOPIC, HERNIORRHAPHY, HIATAL N/A 10/22/2015    Procedure: LAPAROSCOPIC, HERNIORRHAPHY, HIATAL;  Surgeon: Jeanell Sparrow, Hamid R, DO;  Location: [facility];  Service: General;  Laterality: N/A;   . OVARY SURGERY Right >25 yrs   . SPINE SURGERY  11/2013    cervical HNP x 2, Dr. Bufford Buttner       Social History:  Social History     Social History   . Marital status: Single     Spouse name: N/A   . Number of children: 2   . Years of education: N/A  Occupational History   . legal instrument examiner [employer]      Social History Main Topics   . Smoking status: Never Smoker   . Smokeless tobacco: Never Used   . Alcohol use No   . Drug use: No   . Sexual activity: No     Other Topics Concern   . Dietary Supplements / Vitamins Yes   . Anesthesia Problems No   . Blood Thinners No   . Eats Large Amounts No   . Excessive Sweets No   . Skips Meals No   . Eats Excessive Starches Yes   . Snacks Or Grazes No   . Emotional Eater Yes   . Eats Fried Food Yes   . Eats Fast Food Yes   . Diet Center No   . Hmr No   . [name] No   . La Weight Loss No   . Nutri-System No   . Opti-Fast / Medi-Fast No   . Overeaters Anonymous No   . Physicians Weight Loss Center No   . Tops No   . Weight Watchers No   . Atkins No   . Binging / Purging No   . Body For Life No   . Cabbage Soup No   . Calorie Counting No   . Fasting No   . [name] No   . Health Spa No   . Herbal Life No   . High Protein No   . Low Carb No   . Low Fat No   . Mayo  Clinic Diet No   . [place] Diet No   . Margie Billet Diet No   . [place] Diet No   . Slim Fast No   . [place] No   . Sugar Busters No   . Vomiting No   . Zone Diet No   . Stationary Cycle Or Treadmill No   . Gym/Fitness Classes No   . Home Exercise/Video No   . Swimming No   . Team Sports No   . Weight Training No   . Walking Or Running No   . Hospitalization No   . Hypnosis No   . Physical Therapy No   . Psychological Therapy No   . Residential Program No   . Acutrim No   . Amphetamines No   . Anorex No   . Byetta No   . Dexatrim No   . Didrex No   . Fastin No   . Fen - Phen No   . Ionamin / Adipex No   . Mazanor No   . Meridia No   . Obalan No   . Phendiet No   . Phentrol No   . Phenteramine Yes   . Plegine No   . Pondimin No   . Qsymia No   . Prozac No   . Redux No   . Sanorex No   . Tenuate No   . Tepanole No   . Wechless No   . Wellbutrin No   . Xenical (Orlistat, Alli) No   . Other Med No   . No Impairment Yes     Chronic Bilat Knee Pain   . Walks With Cane/Crutch Yes   . Requires A Wheelchair No   . Bedridden No   . Are You Currently Being Treated For Depression? No   . Do You Snore? Yes   . Are You Receiving Any Medical Or Psychological Services? No   .  Do You Have Or Have You Been Treated For An Eating Disorder? No   . Do You Exercise Regularly? No   . Have You Or Family Member Ever Have Trouble With Anesthesia? No     Social History Narrative   . No narrative on file       Family History:  Family History   Problem Relation Age of Onset   . Cancer Mother 97        breast cancer       Outpatient Medication:  Previous Medications    ALBUTEROL (PROVENTIL HFA;VENTOLIN HFA) 108 (90 BASE) MCG/ACT INHALER    Inhale into the lungs every 4 (four) hours as needed.       AMLODIPINE (NORVASC) 5 MG TABLET    Take 1 tablet (5 mg total) by mouth daily.    ATENOLOL (TENORMIN) 50 MG TABLET    1 TABLET AND HALF BY MOUTH DAILY    B COMPLEX VITAMINS (VITAMIN-B COMPLEX) TAB    Take  by Mouth Once a Day.     CHOLECALCIFEROL (VITAMIN D-1000 MAX ST) 1000 UNITS TABLET    Take 1,000 Units by Mouth Once a Day.    DESONIDE (DESOWEN) 0.05 % CREAM    Apply topically 2 (two) times daily as needed.    DIAZEPAM (VALIUM) 2 MG TABLET    Take 1 tablet (2 mg total) by mouth every 8 (eight) hours as needed for Anxiety.    DOCUSATE SODIUM (COLACE) 100 MG CAPSULE    Take 100 mg by mouth daily.        FLUTICASONE (FLONASE) 50 MCG/ACT NASAL SPRAY    2 sprays by Nasal route daily.    PANTOPRAZOLE (PROTONIX) 40 MG TABLET    TAKE 1 TABLET(40 MG) BY MOUTH DAILY    VALACYCLOVIR HCL (VALTREX) 500 MG TABLET    TAKE 1 TABLET(500 MG) BY MOUTH DAILY    VITAMIN A 16109 UNIT CAPSULE    Take 10,000 Units by Mouth Once a Day.         REVIEW OF SYSTEMS   Review of Systems   Constitutional: Positive for malaise/fatigue. Negative for chills and fever.   Respiratory: Negative.  Negative for cough and shortness of breath.    Cardiovascular: Negative.  Negative for chest pain.   Gastrointestinal: Positive for nausea and vomiting. Negative for constipation and diarrhea.   Genitourinary: Negative.  Negative for dysuria.   Neurological: Positive for dizziness, sensory change and headaches.   All other systems reviewed and are negative.       PHYSICAL EXAM     ED Triage Vitals [06/05/17 1827]   Enc Vitals Group      BP 162/89      Heart Rate 72      Resp Rate 18      Temp 98.4 F (36.9 C)      Temp Source Oral      SpO2 100 %      Weight 56.7 kg      Height 1.549 m      Head Circumference       Peak Flow       Pain Score 8      Pain Loc       Pain Edu?       Excl. in GC?      Physical Exam   Constitutional: She is oriented to person, place, and time. She appears well-developed and well-nourished. No distress.   HENT:   Head: Normocephalic and  atraumatic.   Mouth/Throat: Oropharynx is clear and moist.   Eyes: Pupils are equal, round, and reactive to light. Conjunctivae are normal.   Neck: Normal range of motion. Neck supple.   Cardiovascular: Normal rate, regular  rhythm and normal heart sounds.    Pulmonary/Chest: Effort normal and breath sounds normal.   Abdominal: Soft. There is no tenderness. There is no rebound and no guarding.   Neurological: She is alert and oriented to person, place, and time. No cranial nerve deficit.   Skin: Skin is warm and dry. Capillary refill takes less than 2 seconds.   Nursing note and vitals reviewed.      MEDICAL DECISION MAKING     DISCUSSION    Patient with HA and facial numbness that resolved.  HA present with nausea and now with single episode of hematemesis in ED.  Will get CT abd to eval for internal obstruction.  Protonix and phenergan for symptoms.  IVF for lightheadedness.    Patient signed out to Dr. Elam Dutch pending results.    Vital Signs: Reviewed the patient?s vital signs.   Nursing Notes: Reviewed and utilized available nursing notes.  Medical Records Reviewed: Reviewed available past medical records.  Counseling: The emergency provider has spoken with the patient and discussed today?s findings, in addition to providing specific details for the plan of care.  Questions are answered and there is agreement with the plan.      MIPS DOCUMENTATION      HEADACHE  INDICATIONS FOR CT HEAD DUE TO PRIMARY HEADACHE    Head CT for primary headache indications - recent onset of severe headache      CARDIAC STUDIES    The following cardiac studies were independently interpreted by the Emergency Medicine Physician.  For full cardiac study results please see chart.    Monitor Strip  Interpreted by ED Physician  Rate: 72  Rhythm: NSR   ST Changes: none    EKG Interpretation:  Signed and interpreted byED Physician   Time Interpreted: 1830  Rate: 68  Rhythm: NSR  Axis: normal  Intervals: normal  Blocks: none  ST segments: normal  Interpretation: Normal EKG    RADIOLOGY IMAGING STUDIES      CT Head WO Contrast    (Results Pending)   CT Abd/Pelvis with IV Contrast only    (Results Pending)     PULSE OXIMETRY    Oxygen Saturation by Pulse  Oximetry: 100%  Interventions: none  Interpretation:  Normal     EMERGENCY DEPT. MEDICATIONS      ED Medication Orders     Start Ordered     Status Ordering Provider    06/05/17 1841 06/05/17 1840  promethazine (PHENERGAN) injection 12.5 mg  Once     Route: Intravenous  Ordered Dose: 12.5 mg     Charlesetta Garibaldi [facility]    06/05/17 1841 06/05/17 1840  sodium chloride 0.9 % bolus 1,000 mL  Once     Route: Intravenous  Ordered Dose: 1,000 mL     Charlesetta Garibaldi [facility]    06/05/17 1841 06/05/17 1840  pantoprazole (PROTONIX) injection 80 mg  Daily     Route: Intravenous  Ordered Dose: 80 mg     Ordered Karynn Deblasi KAEHLER          LABORATORY RESULTS    Ordered and independently interpreted AVAILABLE laboratory tests. Please see results section in chart for full details.  Results for orders placed or performed during the hospital encounter of  06/05/17   ECG 12 Lead   Result Value Ref Range    Ventricular Rate 68 BPM    Atrial Rate 68 BPM    P-R Interval 168 ms    QRS Duration 84 ms    Q-T Interval 376 ms    QTC Calculation (Bezet) 399 ms    P Axis 47 degrees    R Axis -4 degrees    T Axis 24 degrees       CRITICAL CARE/PROCEDURES    Procedures    DIAGNOSIS      Diagnosis:  Final diagnoses:   None       Disposition:  ED Disposition     None          Prescriptions:  Patient's Medications   New Prescriptions    No medications on file   Previous Medications    ALBUTEROL (PROVENTIL HFA;VENTOLIN HFA) 108 (90 BASE) MCG/ACT INHALER    Inhale into the lungs every 4 (four) hours as needed.       AMLODIPINE (NORVASC) 5 MG TABLET    Take 1 tablet (5 mg total) by mouth daily.    ATENOLOL (TENORMIN) 50 MG TABLET    1 TABLET AND HALF BY MOUTH DAILY    B COMPLEX VITAMINS (VITAMIN-B COMPLEX) TAB    Take  by Mouth Once a Day.    CHOLECALCIFEROL (VITAMIN D-1000 MAX ST) 1000 UNITS TABLET    Take 1,000 Units by Mouth Once a Day.    DESONIDE (DESOWEN) 0.05 % CREAM    Apply topically 2 (two) times daily as needed.     DIAZEPAM (VALIUM) 2 MG TABLET    Take 1 tablet (2 mg total) by mouth every 8 (eight) hours as needed for Anxiety.    DOCUSATE SODIUM (COLACE) 100 MG CAPSULE    Take 100 mg by mouth daily.        FLUTICASONE (FLONASE) 50 MCG/ACT NASAL SPRAY    2 sprays by Nasal route daily.    PANTOPRAZOLE (PROTONIX) 40 MG TABLET    TAKE 1 TABLET(40 MG) BY MOUTH DAILY    VALACYCLOVIR HCL (VALTREX) 500 MG TABLET    TAKE 1 TABLET(500 MG) BY MOUTH DAILY    VITAMIN A 16109 UNIT CAPSULE    Take 10,000 Units by Mouth Once a Day.   Modified Medications    No medications on file   Discontinued Medications    No medications on file            Larina Bras, MD  06/05/17 [phone]

## 2017-06-05 NOTE — ED Notes (Signed)
Pt discharged to home. Pt A&O x 4, resp is reg and unlabored, skin is warm and dry. Discharge instructions give and information about F/U care with questions answered. Pt verbalize understanding of  Instructions. Pt ambulating to lobby with steady gait.

## 2017-06-05 NOTE — ED Notes (Addendum)
Case turned over to me from Dr. Ermelinda Das. CT ab/pelvis and head negative. Discussed with Dr. Moise Boring, neurology. Pt with prior hx of similar symptoms, negative MRI in 2016. Will treat for complex migraines. Pt reports improvement with magnesium. Will give rx for fiorocet. pepcid for episode of hematemesis, consider Mallory weiss tear.   Pt with no witnessed vomiting. coags and hemoglobin stable.     Outpatient follow-up with primary physician. Pt agreeable to plan.      Rulon Abide, MD  06/05/17 2126       Rulon Abide, MD  06/05/17 2128

## 2017-06-07 ENCOUNTER — Telehealth (INDEPENDENT_AMBULATORY_CARE_PROVIDER_SITE_OTHER): Payer: Self-pay

## 2017-06-07 NOTE — Telephone Encounter (Signed)
ED Follow Up CALL:    EDVisit Date:06/05/17  Primary Discharge Dx: Hematmesis with nausea  Secondary Dx: Headache syndrome  Follow up Appt with PCP: 0  Call placed to patient to follow up recent hospital discharge, assess current status, address questions/concerns regarding discharge instructions / medications and encourage patient to schedule Hospital Discharge follow up appt with PCP; No Answer; LVMM with contact information and request for return call.  Will continue to follow.

## 2017-06-14 ENCOUNTER — Ambulatory Visit (INDEPENDENT_AMBULATORY_CARE_PROVIDER_SITE_OTHER): Payer: Commercial Managed Care - HMO | Admitting: Internal Medicine

## 2017-06-14 ENCOUNTER — Encounter (INDEPENDENT_AMBULATORY_CARE_PROVIDER_SITE_OTHER): Payer: Self-pay | Admitting: Internal Medicine

## 2017-06-14 VITALS — BP 144/80 | HR 60 | Temp 98.0°F | Ht 61.0 in | Wt 134.2 lb

## 2017-06-14 DIAGNOSIS — R519 Headache, unspecified: Secondary | ICD-10-CM

## 2017-06-14 DIAGNOSIS — I10 Essential (primary) hypertension: Secondary | ICD-10-CM

## 2017-06-14 DIAGNOSIS — R51 Headache: Secondary | ICD-10-CM

## 2017-06-14 DIAGNOSIS — R002 Palpitations: Secondary | ICD-10-CM

## 2017-06-14 LAB — ECG 12-LEAD
Atrial Rate: 68 {beats}/min
P Axis: 47 degrees
P-R Interval: 168 ms
Q-T Interval: 376 ms
QRS Duration: 84 ms
QTC Calculation (Bezet): 399 ms
R Axis: -4 degrees
T Axis: 24 degrees
Ventricular Rate: 68 {beats}/min

## 2017-06-14 MED ORDER — ATENOLOL 50 MG PO TABS
50.0000 mg | ORAL_TABLET | Freq: Every day | ORAL | 1 refills | Status: DC
Start: 2017-06-14 — End: 2017-10-07

## 2017-06-14 MED ORDER — BUTALBITAL-APAP-CAFFEINE 50-325-40 MG PO TABS
1.0000 | ORAL_TABLET | Freq: Four times a day (QID) | ORAL | 0 refills | Status: DC | PRN
Start: 2017-06-14 — End: 2017-10-21

## 2017-06-14 NOTE — Progress Notes (Signed)
Have you seen any specialists/other providers since your last visit with [place]?      Yes bariatric MD    Arm preference verified?     Yes    The patient is due for mammogram and shingles vaccine. Patient notified.

## 2017-06-14 NOTE — Progress Notes (Signed)
Subjective:       Patient ID: Amber [name] is a 66 y.o. female.    HPI  Pt is here for follow up     1. Went to ED 1 week ago for h/a, abd pain, n/v, lightheadedness  Low BP, sometimes down in 80's systolic, other times up 160's,   Labs, EKG, CT head, abd/pelvis, neg,   rx'd fioricet, still has freq headache, no photophobia, no numbness  No prior hx h/a.   2. HTN, home BP fluctuating,     The following portions of the patient's history were reviewed and updated as appropriate: allergies, current medications, past family history, past medical history, past social history, past surgical history and problem list.    Review of Systems   Constitutional: Negative for appetite change, fever and unexpected weight change.   Eyes: Negative for visual disturbance.   Respiratory: Negative for shortness of breath.    Cardiovascular: Negative for chest pain and palpitations.   Genitourinary: Negative for dysuria.   Neurological: Positive for headaches. Negative for syncope, speech difficulty, weakness and numbness.     BP 144/80   Pulse 60   Temp 98 F (36.7 C) (Oral)   Ht 1.549 m (5\' 1" )   Wt 60.9 kg (134 lb 3.2 oz)   SpO2 99%   BMI 25.36 kg/m        Objective:    Physical Exam   Constitutional: She appears well-developed. No distress.   HENT:   Mouth/Throat: Oropharynx is clear and moist. No oropharyngeal exudate.   Eyes: Conjunctivae are normal. No scleral icterus.   Neck: Neck supple. No JVD present. Carotid bruit is not present. No thyromegaly present.   Cardiovascular: Normal rate, regular rhythm and normal heart sounds.  Exam reveals no gallop and no friction rub.    No murmur heard.  Pulmonary/Chest: Effort normal and breath sounds normal. No respiratory distress. She has no rales.   Abdominal: Soft. Bowel sounds are normal. She exhibits no distension and no mass. There is no tenderness. There is no rebound and no guarding.   Musculoskeletal: She exhibits no edema.   Neurological: She is alert.   No focal  deficits, romberg, tandem gait normal,             Assessment:       1. Acute nonintractable headache, unspecified headache type  butalbital-acetaminophen-caffeine (FIORICET, ESGIC) 50-325-40 MG per tablet    Neurology Referral: [name], MD [facility])   2. Essential hypertension  atenolol (TENORMIN) 50 MG tablet   3. Palpitations  atenolol (TENORMIN) 50 MG tablet          Plan:      Procedures  No orders of the defined types were placed in this encounter.      Continue Fioricet prn  Referred pt to neuro, Dr. [name], for evaluation  Reduce atenolol to 50mg  daily  Continue amlodipine  Hold BP meds for SBP<130  Monitor BP  F/u 3 months or sooner prn.

## 2017-06-24 ENCOUNTER — Other Ambulatory Visit (HOSPITAL_BASED_OUTPATIENT_CLINIC_OR_DEPARTMENT_OTHER): Payer: Self-pay

## 2017-07-02 ENCOUNTER — Emergency Department: Payer: Commercial Managed Care - HMO

## 2017-07-02 ENCOUNTER — Emergency Department
Admission: EM | Admit: 2017-07-02 | Discharge: 2017-07-02 | Disposition: A | Discharge status: Home / Self Care | Payer: Commercial Managed Care - HMO | Attending: Emergency Medicine | Admitting: Emergency Medicine

## 2017-07-02 DIAGNOSIS — Z8673 Personal history of transient ischemic attack (TIA), and cerebral infarction without residual deficits: Secondary | ICD-10-CM | POA: Insufficient documentation

## 2017-07-02 DIAGNOSIS — I10 Essential (primary) hypertension: Secondary | ICD-10-CM | POA: Insufficient documentation

## 2017-07-02 DIAGNOSIS — E785 Hyperlipidemia, unspecified: Secondary | ICD-10-CM | POA: Insufficient documentation

## 2017-07-02 DIAGNOSIS — K219 Gastro-esophageal reflux disease without esophagitis: Secondary | ICD-10-CM | POA: Insufficient documentation

## 2017-07-02 DIAGNOSIS — R079 Chest pain, unspecified: Secondary | ICD-10-CM | POA: Insufficient documentation

## 2017-07-02 DIAGNOSIS — Z79899 Other long term (current) drug therapy: Secondary | ICD-10-CM | POA: Insufficient documentation

## 2017-07-02 DIAGNOSIS — Z6841 Body Mass Index (BMI) 40.0 and over, adult: Secondary | ICD-10-CM | POA: Insufficient documentation

## 2017-07-02 LAB — COMPREHENSIVE METABOLIC PANEL
ALT: 10 U/L (ref 0–55)
AST (SGOT): 19 U/L (ref 5–34)
Albumin/Globulin Ratio: 1.5 (ref 0.9–2.2)
Albumin: 3.9 g/dL (ref 3.5–5.0)
Alkaline Phosphatase: 77 U/L (ref 37–106)
Anion Gap: 11 (ref 5.0–15.0)
BUN: 14 mg/dL (ref 7.0–19.0)
Bilirubin, Total: 0.3 mg/dL (ref 0.2–1.2)
CO2: 24 mEq/L (ref 22–29)
Calcium: 9.5 mg/dL (ref 8.5–10.5)
Chloride: 108 mEq/L (ref 100–111)
Creatinine: 0.7 mg/dL (ref 0.6–1.0)
Globulin: 2.6 g/dL (ref 2.0–3.6)
Glucose: 85 mg/dL (ref 70–100)
Potassium: 3.3 mEq/L — ABNORMAL LOW (ref 3.5–5.1)
Protein, Total: 6.5 g/dL (ref 6.0–8.3)
Sodium: 143 mEq/L (ref 136–145)

## 2017-07-02 LAB — GFR: EGFR: >60

## 2017-07-02 LAB — TROPONIN I
Troponin I: <0.01 ng/mL (ref 0.00–0.09)
Troponin I: <0.01 ng/mL (ref 0.00–0.09)

## 2017-07-02 LAB — ECG 12-LEAD
Atrial Rate: 69 {beats}/min
Atrial Rate: 57 {beats}/min
P Axis: 64 degrees
P Axis: 67 degrees
P-R Interval: 170 ms
P-R Interval: 158 ms
Q-T Interval: 398 ms
Q-T Interval: 416 ms
QRS Duration: 86 ms
QRS Duration: 86 ms
QTC Calculation (Bezet): 426 ms
QTC Calculation (Bezet): 404 ms
R Axis: -10 degrees
R Axis: -11 degrees
T Axis: 16 degrees
T Axis: 16 degrees
Ventricular Rate: 69 {beats}/min
Ventricular Rate: 57 {beats}/min

## 2017-07-02 LAB — CBC AND DIFFERENTIAL
Absolute NRBC: 0 10*3/uL
Basophils Absolute Automated: 0.02 10*3/uL (ref 0.00–0.20)
Basophils Automated: 0.4 %
Eosinophils Absolute Automated: 0.1 10*3/uL (ref 0.00–0.70)
Eosinophils Automated: 1.9 %
Hematocrit: 35.1 % — ABNORMAL LOW (ref 37.0–47.0)
Hgb: 11.6 g/dL — ABNORMAL LOW (ref 12.0–16.0)
Immature Granulocytes Absolute: 0.01 10*3/uL
Immature Granulocytes: 0.2 %
Lymphocytes Absolute Automated: 1.81 10*3/uL (ref 0.50–4.40)
Lymphocytes Automated: 34.8 %
MCH: 28.4 pg (ref 28.0–32.0)
MCHC: 33 g/dL (ref 32.0–36.0)
MCV: 86 fL (ref 80.0–100.0)
MPV: 9.8 fL (ref 9.4–12.3)
Monocytes Absolute Automated: 0.54 10*3/uL (ref 0.00–1.20)
Monocytes: 10.4 %
Neutrophils Absolute: 2.72 10*3/uL (ref 1.80–8.10)
Neutrophils: 52.3 %
Nucleated RBC: 0 /100 WBC (ref 0.0–1.0)
Platelets: 212 10*3/uL (ref 140–400)
RBC: 4.08 10*6/uL — ABNORMAL LOW (ref 4.20–5.40)
RDW: 16 % — ABNORMAL HIGH (ref 12–15)
WBC: 5.2 10*3/uL (ref 3.50–10.80)

## 2017-07-02 LAB — HEMOLYSIS INDEX: Hemolysis Index: 7 (ref 0–18)

## 2017-07-02 LAB — LIPASE: Lipase: 13 U/L (ref 8–78)

## 2017-07-02 MED ORDER — ASPIRIN 81 MG PO CHEW
162.0000 mg | CHEWABLE_TABLET | Freq: Once | ORAL | Status: DC
Start: 2017-07-02 — End: 2017-07-02

## 2017-07-02 MED ORDER — MORPHINE SULFATE 2 MG/ML IJ/IV SOLN (WRAP)
2.0000 mg | Freq: Once | Status: AC
Start: 2017-07-02 — End: 2017-07-02
  Administered 2017-07-02: 09:00:00 2 mg via INTRAVENOUS
  Filled 2017-07-02: qty 1

## 2017-07-02 MED ORDER — LIDOCAINE VISCOUS 2 % MT SOLN
10.0000 mL | Freq: Once | OROMUCOSAL | Status: AC
Start: 2017-07-02 — End: 2017-07-02
  Administered 2017-07-02: 09:00:00 10 mL via OROMUCOSAL
  Filled 2017-07-02: qty 15

## 2017-07-02 MED ORDER — SUCRALFATE 1 GM/10ML PO SUSP
1.0000 g | Freq: Four times a day (QID) | ORAL | 0 refills | Status: DC
Start: 2017-07-02 — End: 2017-09-14

## 2017-07-02 MED ORDER — FAMOTIDINE 20 MG PO TABS
20.0000 mg | ORAL_TABLET | Freq: Once | ORAL | Status: AC
Start: 2017-07-02 — End: 2017-07-02
  Administered 2017-07-02: 09:00:00 20 mg via ORAL
  Filled 2017-07-02: qty 1

## 2017-07-02 NOTE — ED Notes (Signed)
Bed: GR7  Expected date: 07/02/17  Expected time: 7:32 AM  Means of arrival: [id number] - Sheria Lang  Comments:  [facility]

## 2017-07-02 NOTE — ED Provider Notes (Signed)
EMERGENCY DEPARTMENT HISTORY AND PHYSICAL EXAM     Physician/Midlevel provider first contact with patient: 07/02/17 0737         Date: 07/02/2017  Patient Name: Amber [name]      Provider Assessment       Provider Assessment: 66 y.o. female with chest pain, uncertain etiology. Reviewed chart, multiple similar prior presentations, will do trial of sucralfate as this helped in the past. Regarding ACS, the patient does have risk of this however prior cath neg and troponin and EKG negative x2, discussed with cardiologist - I believe risk of ACS at this point is lower than danger of iatrogenesis of further hospitalization. Very much doubt PE, dissection, bowel obstruction, sepsis. Follow up with cardiology, PMD.      History of Presenting Illness     History Provided By: Patient    Preferred Language: English     Chief Complaint: CP  Onset: yesterday  Timing: constant  Location: L-sided  Quality: aching  Severity: moderate  Modifying Factors: unrelieved with tylenol and tums  Associated Symptoms: diarrhea, abd pain, back pain  Pertinent Negatives: cough, fever, vomiting    Additional History: Amber [name] is a 66 y.o. female presenting to the ED with constant L sided CP. The patient was in her usual state of health until yesterday when she experienced constant L-sided CP all day, which woke her up around 4 AM this morning with accompanying abd cramping radiating to her back, unrelieved with Tums and tylenol. She notes h/o similar symptoms, recent ingestion of lots of food resulting in diarrhea and denies cough, fever, recent travel, vomiting. Given Ativan by EMS for anxiety.    PCP: Oneita Jolly, MD   Cardiologist: Dr. Arlyss Queen      No current facility-administered medications for this encounter.      Current Outpatient Prescriptions   Medication Sig Dispense Refill   . albuterol (PROVENTIL HFA;VENTOLIN HFA) 108 (90 BASE) MCG/ACT inhaler Inhale into the lungs every 4 (four) hours as needed.        Marland Kitchen amLODIPine  (NORVASC) 5 MG tablet Take 1 tablet (5 mg total) by mouth daily. 30 tablet 5   . atenolol (TENORMIN) 50 MG tablet Take 1 tablet (50 mg total) by mouth daily. 90 tablet 1   . B Complex Vitamins (VITAMIN-B COMPLEX) Tab Take  by Mouth Once a Day.     . butalbital-acetaminophen-caffeine (FIORICET, ESGIC) 50-325-40 MG per tablet Take 1 tablet by mouth every 6 (six) hours as needed for Pain. 60 tablet 0   . cholecalciferol (VITAMIN D-1000 MAX ST) 1000 units tablet Take 1,000 Units by Mouth Once a Day.     . desonide (DESOWEN) 0.05 % cream Apply topically 2 (two) times daily as needed. 60 g 2   . diazePAM (VALIUM) 2 MG tablet Take 1 tablet (2 mg total) by mouth every 8 (eight) hours as needed for Anxiety. 30 tablet 2   . docusate sodium (COLACE) 100 MG capsule Take 100 mg by mouth daily.         . famotidine (PEPCID) 20 MG tablet Take 1 tablet (20 mg total) by mouth 2 (two) times daily. 15 tablet 0   . fluticasone (FLONASE) 50 MCG/ACT nasal spray 2 sprays by Nasal route daily. (Patient taking differently: 2 sprays by Nasal route as needed.   ) 16 g 2   . pantoprazole (PROTONIX) 40 MG tablet TAKE 1 TABLET(40 MG) BY MOUTH DAILY 90 tablet 0   . sucralfate (  CARAFATE) 1 GM/10ML suspension Take 10 mLs (1 g total) by mouth 4 (four) times daily. 420 mL 0   . valACYclovir HCL (VALTREX) 500 MG tablet TAKE 1 TABLET(500 MG) BY MOUTH DAILY 90 tablet 0   . vitamin A 16109 UNIT capsule Take 10,000 Units by Mouth Once a Day.         Past History     Past Medical History:  Past Medical History:   Diagnosis Date   . Chest pain 2016     Chest pain after eating x6  months--being followed by GI for GERD   . Constipation    . Difficulty walking     amb with cane secondary to arthritis bil knees   . Genital herpes     no current outbreak (10/03/15)   . GERD (gastroesophageal reflux disease)    . Hiatal hernia    . Hyperlipidemia    . Hypertensive disorder     Well controlled on med. Denies any SOB in last 6 months (10/03/15)   . Morbid obesity  with BMI of 40.0-44.9, adult     BMI 40.9   . Nausea without vomiting    . OSA on CPAP 2015    CPAP nightly x 1 yr   . Osteoarthritis of knees, bilateral    . TIA (transient ischemic attack) 2014    TIA 2014-no residual. Patient evaulated for possible TIA 07/12/2015  @ [facility] (dischage summary in epic)-per summary CT of head was normal and patient was d/c'd       Past Surgical History:  Past Surgical History:   Procedure Laterality Date   . APPENDECTOMY  >25 yrs   . EGD  01/2015   . fallopian tube surgery  >25 yrs   . INSERTION, PAIN PUMP (MEDICAL) N/A 10/22/2015    Procedure: INSERTION, PAIN PUMP (MEDICAL);  Surgeon: Nicola Police, DO;  Location: [place] [facility] OR;  Service: General;  Laterality: N/A;   . LAPAROSCOPIC, CHOLECYSTECTOMY, CHOLANGIOGRAM  08/13/2014   . 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 LAPAROSCOPIC, HERNIORRHAPHY, HIATAL N/A 10/22/2015    Procedure: LAPAROSCOPIC, HERNIORRHAPHY, HIATAL;  Surgeon: Jeanell Sparrow, Hamid R, DO;  Location: [facility]IN OR;  Service: General;  Laterality: N/A;   . OVARY SURGERY Right >25 yrs   . SPINE SURGERY  11/2013    cervical HNP x 2, Dr. Bufford Buttner       Family History:  Family History   Problem Relation Age of Onset   . Cancer Mother 102        breast cancer       Social History:  Social History   Substance Use Topics   . Smoking status: Never Smoker   . Smokeless tobacco: Never Used   . Alcohol use No       Allergies:  Allergies   Allergen Reactions   . Acyclovir Nausea And Vomiting   . Amoxicillin-Pot Clavulanate      Other reaction(s): gi distress   . Aspirin Nausea And Vomiting     Other reaction(s): gi distress  Upsets stomach  Able to tolerate enteric coated aspirin   . Atorvastatin      Palpitation   . Lovastatin Nausea And Vomiting   . Metronidazole Nausea And Vomiting   . Moxifloxacin Nausea And Vomiting      GI symptoms   .  Other Nausea And Vomiting   . Rosuvastatin      Palpitation, chest pain, abd pain    . Statins    . Sulfa Antibiotics Nausea And Vomiting     Other reaction(s): gi distress       Review of Systems     Review of Systems   Constitutional: Negative for activity change and fever.   HENT: Negative for trouble swallowing.    Eyes: Negative for discharge.   Respiratory: Negative for cough and shortness of breath.    Cardiovascular: Positive for chest pain.   Gastrointestinal: Positive for abdominal pain and diarrhea. Negative for nausea and vomiting.   Genitourinary: Negative for dysuria.   Musculoskeletal: Positive for back pain. Negative for neck pain.   Skin: Negative for rash.   Neurological: Negative for headaches.   Psychiatric/Behavioral: Negative for confusion.         Physical Exam   BP 139/77   Pulse 60   Temp 98 F (36.7 C) (Oral)   Resp 21   Ht 5\' 1"  (1.549 m)   Wt 59 kg   SpO2 100%   BMI 24.56 kg/m     Physical Examination:    General appearance - Well appearing, not in distress  Eyes - Pupils equal, extraocular eye movements grossly intact  ENT - Moist mucous membranes, OP normal  Neck - Supple, grossly normal ROM  Heart - Normal rate, regular rhythm  Chest - Normal work of breathing, lungs clear to auscultation bilaterally. L-sided chest tenderness  Abdomen - Soft, non-distended. Epigastric tenderness  Neurological - Alert, moves all extremities, cranial nerves grossly normal  Psychiatric - Grossly oriented, normal insight  Musculoskeletal - No deformity or tenderness. No spinal tenderness, no back tenderness  Extremities - No pedal edema. Normal pulses all extremities   Skin - Normal coloration, no rashes where visualized       Diagnostic Study Results     Labs -     Results     Procedure Component Value Units Date/Time    Troponin I [[id number]] Collected:  07/02/17 1054    Specimen:  Blood Updated:  07/02/17 1123     Troponin I <0.01 ng/mL     Lipase [[id number]] Collected:   07/02/17 0742    Specimen:  Blood Updated:  07/02/17 0927     Lipase 13 U/L     Troponin I [[id number]] Collected:  07/02/17 0742    Specimen:  Blood Updated:  07/02/17 0815     Troponin I <0.01 ng/mL     Comprehensive metabolic panel [[id number]]  (Abnormal) Collected:  07/02/17 0742    Specimen:  Blood Updated:  07/02/17 0809     Glucose 85 mg/dL      BUN 24.4 mg/dL      Creatinine 0.7 mg/dL      Sodium 010 mEq/L      Potassium 3.3 (L) mEq/L      Chloride 108 mEq/L      CO2 24 mEq/L      Calcium 9.5 mg/dL      Protein, Total 6.5 g/dL      Albumin 3.9 g/dL      AST (SGOT) 19 U/L      ALT 10 U/L      Alkaline Phosphatase 77 U/L      Bilirubin, Total 0.3 mg/dL      Globulin 2.6 g/dL      Albumin/Globulin Ratio 1.5     Anion Gap 11.0    Hemolysis  index [[id number]] Collected:  07/02/17 0742     Updated:  07/02/17 0809     Hemolysis Index 7    GFR [[id number]] Collected:  07/02/17 0742     Updated:  07/02/17 0809     EGFR >60.0    CBC with differential [[id number]]  (Abnormal) Collected:  07/02/17 0742    Specimen:  Blood from Blood Updated:  07/02/17 0753     WBC 5.20 x10 3/uL      Hgb 11.6 (L) g/dL      Hematocrit 08.6 (L) %      Platelets 212 x10 3/uL      RBC 4.08 (L) x10 6/uL      MCV 86.0 fL      MCH 28.4 pg      MCHC 33.0 g/dL      RDW 16 (H) %      MPV 9.8 fL      Neutrophils 52.3 %      Lymphocytes Automated 34.8 %      Monocytes 10.4 %      Eosinophils Automated 1.9 %      Basophils Automated 0.4 %      Immature Granulocyte 0.2 %      Nucleated RBC 0.0 /100 WBC      Neutrophils Absolute 2.72 x10 3/uL      Abs Lymph Automated 1.81 x10 3/uL      Abs Mono Automated 0.54 x10 3/uL      Abs Eos Automated 0.10 x10 3/uL      Absolute Baso Automated 0.02 x10 3/uL      Absolute Immature Granulocyte 0.01 x10 3/uL      Absolute NRBC 0.00 x10 3/uL           Radiologic Studies -   Radiology Results (24 Hour)     Procedure Component Value Units Date/Time    Chest AP Portable [[id number]] Collected:  07/02/17 5284    Order Status:   Completed Updated:  07/02/17 0825    Narrative:       History: chest pain    Technique: Single Portable View    Comparison: 04-03-17    Findings:  The lungs appear clear.  There is no pneumothorax.  The heart is normal in size.    The mediastinum is within normal limits.             Impression:        No active disease is seen in the chest.    Laurena Slimmer, MD   07/02/2017 8:20 AM      .    Medical Decision Making   I am the first provider for this patient.    I reviewed the vital signs, available nursing notes, past medical history, past surgical history, family history and social history.    Vital Signs-Reviewed the patient's vital signs.     Patient Vitals for the past 12 hrs:   BP Temp Pulse Resp   07/02/17 1058 139/77 98 F (36.7 C) 60 21   07/02/17 0900 141/80 - 63 16   07/02/17 0847 142/77 - 67 16   07/02/17 0830 - - 67 17   07/02/17 0737 163/87 98 F (36.7 C) 70 18       Pulse Oximetry Analysis - Normal 99% on RA    Cardiac Monitor:  Rate: 69  Rhythm:  Normal Sinus Rhythm     EKG:  Interpreted by the EP.   Time Interpreted: 1324   Rate:  69   Rhythm: Normal Sinus Rhythm    QTc: 426   Interpretation:  LVH, no ischemic abnormality   Comparison:06/05/17: no significant changes      Old Medical Records: Old medical records.  Pt was seen at [facility] ED in June for CP, two negative troponin, was discharged and given Rx. Felt much better.  Pt was seen by Dr. Willaim Bane, cardiolgoy, in March and has had two negative cardiac caths, most recent in 2018    ED Course:   7:56 AM - Per ems pt was feeling anxious and was given ativan PTA   8:24 AM - Discussed treatment plan including blood work and pt agrees. Pt requests stronger pain medication.      9:21 AM - Discussed with Dr. Particia Nearing, cardiology who agrees with plan for repeat troponin and discharge    9:57 AM - Pt states she feels better    11:36 AM - Pt states she feels well and is ready to go home. All results were discussed with the patient, all questions were  answered, and the patient is comfortable with the disposition. Reviewed possibility of progression of disease and diagnostic uncertainty. Reviewed return precautions in detail.      Diagnosis     Clinical Impression:   1. Chest pain, unspecified type        Treatment Plan:   ED Disposition     ED Disposition Condition Date/Time Comment    Discharge  Fri Jul 02, 2017 11:38 AM Amber [name] discharge to home/self care.    Condition at disposition: Stable            _______________________________      This note is prepared by  Garret Reddish acting as Scribe for Charlott Holler, MD.    Charlott Holler, MD.  The scribe's documentation has been prepared under my direction and personally reviewed by me in its entirety.  I confirm that the note above accurately reflects all work, treatment, procedures, and medical decision making performed by me.  _______________________________       Theresia Bough, MD  07/03/17 [phone]

## 2017-07-02 NOTE — Discharge Instructions (Signed)
You were seen in the [facility] Emergency Department by Dr. Michel Harrow.       For home care, drink plenty of fluids.     Please make sure to return if you have fever, vomiting, if your pain worsens, if you are not getting better, or any other concerns.  We are always open and happy to help.    Please be aware that an emergency department diagnosis is usually preliminary, and that a definitive diagnosis cannot be made without the help of time or further testing.  Every disease has a progression and may take time before it is recognizable by a physician.  It is extremely important that you return to the Emergency Department or see your own doctor if you do not improve, or especially if your symptoms worsen or change.     In short, do not hesitate to return to the emergency department if you sense something is not right. We are never upset to see you again.    Take your discharge instructions to your primary care doctor and all other follow-up care so they have a record of what happened in your visit today. Emergency care is not a substitute for general medical care.    Chest Pain of Unclear Etiology    You have been seen for chest pain. The cause of your pain is not yet known.    Your doctor has learned about your medical history, examined you, and checked any tests that were done. Still, it is unclear why you are having pain. The doctor thinks there is only a very small chance that your pain is caused by a life-threatening condition. Later, your primary care doctor might do more tests or check you again.    Sometimes chest pain is caused by a dangerous condition, like a heart attack, aorta injury, blood clot in the lung, or collapsed lung. It is unlikely that your pain is caused by a life-threatening condition if: Your chest pain lasts only a few seconds at a time; you are not short of breath, nauseated (sick to your stomach), sweaty, or lightheaded; your pain gets worse when you twist or bend; your pain  improves with exercise or hard work.    Chest pain is serious. It is VERY IMPORTANT that you follow up with your regular doctor and seek medical attention immediately here or at the nearest Emergency Department if your symptoms become worse or they change.    YOU SHOULD SEEK MEDICAL ATTENTION IMMEDIATELY, EITHER HERE OR AT THE NEAREST EMERGENCY DEPARTMENT, IF ANY OF THE FOLLOWING OCCURS:   Your pain gets worse.   Your pain makes you short of breath, nauseated, or sweaty.   Your pain gets worse when you walk, go up stairs, or exert yourself.   You feel weak, lightheaded, or faint.   It hurts to breathe.   Your leg swells.   Your symptoms get worse or you have new symptoms or concerns.

## 2017-07-02 NOTE — EDIE (Signed)
Amber [name], [name]: [id number]    This patient has registered at the [facility] Emergency Department   For more information visit: [email or web address]   Criteria met      5 ED Visits in 12 Months    3 Different Facilities in 90 Days    Security Events  No recent Security Events currently on file    ED Care Guidelines  There are currently no ED Care Guidelines in [place] for this patient. Please check your facility's medical records system.    Care Providers  Emelio Schneller has no care providers on record at this time.   E.D. Visit Count (12 mo.)  Facility Visits   [facility] 4   [facility] 2   [place] [facility] 1   Total 7   Note: Visits indicate total known visits.      Recent Emergency Department Visit Summary  Admit Date Facility [facility] Type Major Type Diagnoses or Chief Complaint   Jul 02, 2017 [place] [facility] [place] [facility]  Emergency      chest pain      Jun 05, 2017 [facility] Emergency Room ? [facility] [employer] [place] [facility]  Emergency      Headache, throbbing pain all over, light headed, nauseated      Dizziness      Headache      Other headache syndrome      Hematemesis      Apr 03, 2017 [place] [facility] 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       Prescription Monitoring Program  000??- Narcotic Use Score  000??- Sedative Use Score  000??- Stimulant Use Score  - All Scores range from 000-999 with 75% of the population scoring < 200 and on 1% scoring above 650  - The last digit of the narcotic, sedative, and stimulant score indicates the number of active prescriptions of that type  - Higher Use scores correlate with increased prescribers, pharmacies, mg equiv, and overlapping  prescriptions   Concerning or unexpectedly high scores should prompt a review of the PMP record; this does not constitute checking PMP for prescribing purposes.    The above information is provided for the sole purpose of patient treatment. Use of this information beyond the terms of Data Sharing Memorandum of Understanding and License Agreement is prohibited. In certain cases not all visits may be represented. Consult the aforementioned facilities for additional information.   ? 2018 [employer] - [place], [place] - [email or web address]

## 2017-07-03 ENCOUNTER — Emergency Department
Admission: EM | Admit: 2017-07-03 | Discharge: 2017-07-03 | Disposition: A | Discharge status: Home / Self Care | Payer: Commercial Managed Care - HMO | Attending: Emergency Medicine | Admitting: Emergency Medicine

## 2017-07-03 DIAGNOSIS — I10 Essential (primary) hypertension: Secondary | ICD-10-CM | POA: Insufficient documentation

## 2017-07-03 DIAGNOSIS — Z9884 Bariatric surgery status: Secondary | ICD-10-CM | POA: Insufficient documentation

## 2017-07-03 DIAGNOSIS — L509 Urticaria, unspecified: Secondary | ICD-10-CM | POA: Insufficient documentation

## 2017-07-03 DIAGNOSIS — K449 Diaphragmatic hernia without obstruction or gangrene: Secondary | ICD-10-CM | POA: Insufficient documentation

## 2017-07-03 DIAGNOSIS — Z79899 Other long term (current) drug therapy: Secondary | ICD-10-CM | POA: Insufficient documentation

## 2017-07-03 DIAGNOSIS — K219 Gastro-esophageal reflux disease without esophagitis: Secondary | ICD-10-CM | POA: Insufficient documentation

## 2017-07-03 DIAGNOSIS — Z8673 Personal history of transient ischemic attack (TIA), and cerebral infarction without residual deficits: Secondary | ICD-10-CM | POA: Insufficient documentation

## 2017-07-03 DIAGNOSIS — E785 Hyperlipidemia, unspecified: Secondary | ICD-10-CM | POA: Insufficient documentation

## 2017-07-03 MED ORDER — CETIRIZINE HCL 10 MG PO TABS
10.0000 mg | ORAL_TABLET | Freq: Once | ORAL | Status: DC
Start: 2017-07-03 — End: 2017-07-03

## 2017-07-03 MED ORDER — FAMOTIDINE 20 MG PO TABS
20.0000 mg | ORAL_TABLET | Freq: Once | ORAL | Status: AC
Start: 2017-07-03 — End: 2017-07-03
  Administered 2017-07-03: 08:00:00 20 mg via ORAL
  Filled 2017-07-03: qty 1

## 2017-07-03 MED ORDER — PREDNISONE 20 MG PO TABS
60.0000 mg | ORAL_TABLET | Freq: Once | ORAL | Status: AC
Start: 2017-07-03 — End: 2017-07-03
  Administered 2017-07-03: 08:00:00 60 mg via ORAL
  Filled 2017-07-03: qty 3

## 2017-07-03 MED ORDER — METHYLPREDNISOLONE 4 MG PO TBPK
ORAL_TABLET | ORAL | 0 refills | Status: DC
Start: 2017-07-03 — End: 2017-08-30

## 2017-07-03 MED ORDER — DIPHENHYDRAMINE HCL 25 MG PO TABS
25.0000 mg | ORAL_TABLET | Freq: Four times a day (QID) | ORAL | 0 refills | Status: DC | PRN
Start: 2017-07-03 — End: 2017-09-14

## 2017-07-03 NOTE — ED Triage Notes (Signed)
Patient c/o hives on neck and arm onset this morning. Patient reports possible allergic reaction on ambulance medication given to her by paramedic unable to recall name. Patient recently discharge from [place] due to CP and r/o. Patient didn't take medication for itchiness. Patient denies SOB.

## 2017-07-03 NOTE — EDIE (Signed)
Standley Bargo?NOTIFICATION?07/03/2017 07:56?Amber [name], Amber [name]?MRN: [id number]    This patient has registered at the [facility] Emergency Room ? [facility] Department   For more information visit: [email or web address]   Criteria met      5 ED Visits in 12 Months    Security Events  No recent Security Events currently on file    ED Care Guidelines  There are currently no ED Care Guidelines in [name] for this patient. Please check your facility's medical records system.    Care Providers  Amber [name] has no care providers on record at this time.   E.D. Visit Count (12 mo.)  Facility Visits   [facility] 4   [facility] 2   [place] [facility] ? [facility] 2   Total 8   Note: Visits indicate total known visits.      Recent Emergency Department Visit Summary  Admit Date Facility [facility] Type Major Type Diagnoses or Chief Complaint   Jul 03, 2017 [facility] Room ? [facility] [employer] [place] [facility]  Emergency      possible allergic reaction      Jul 02, 2017 [place] [facility] [place] [facility]  Emergency      chest pain      Chest pain, unspecified      Jun 05, 2017 [facility] Emergency Room ? [facility] [employer] [place] [facility]  Emergency      Headache, throbbing pain all over, light headed, nauseated      Dizziness      Headache      Other headache syndrome      Hematemesis      Apr 03, 2017 [place] [facility] 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         The above information is provided for the sole purpose of patient treatment. Use of this information beyond the terms of Data Sharing Memorandum of Understanding and License Agreement is prohibited. In certain cases not all visits may be represented. Consult the aforementioned facilities for additional  information.   ? 2018 [employer] - [place], [place] - [email or web address]

## 2017-07-03 NOTE — ED Provider Notes (Signed)
EMERGENCY DEPARTMENT NOTE    Physician/Midlevel provider first contact with patient: 07/03/17 0758         HISTORY OF PRESENT ILLNESS   Historian:Patient  Translator Used: No    Chief Complaint: Urticaria       66 y.o. femalehx of GERD, HPL, HTN, who presents with pruritic rash. Pt reports onset of symptoms was this AM. Last night, pt was seen at [facility] for chest pain. Reports she was given ativan by EMS. Also received morphine and pepcid. This AM, noted rash on posterior neck. Also along R arm and back. Pruritic in nature. Remote hx of hives. Denies SOB. No trouble swallowing. Tolerating secretions. Slept at a friend's house. Denies dietary changes.     1. Location of symptoms: neck, arm, back   2. Onset of symptoms: this AM   3. What was patient doing when symptoms started (Context): see above  4. Severity: moderate  5. Timing: constant  6. Activities that worsen symptoms: n/a  7. Activities that improve symptoms: n/a  8. Quality: pruritic   9. Radiation of symptoms: no  10. Associated signs and Symptoms: see above  11. Are symptoms worsening? yes  MEDICAL HISTORY     Past Medical History:  Past Medical History:   Diagnosis Date   . Chest pain 2016     Chest pain after eating x6  months--being followed by GI for GERD   . Constipation    . Difficulty walking     amb with cane secondary to arthritis bil knees   . Genital herpes     no current outbreak (10/03/15)   . GERD (gastroesophageal reflux disease)    . Hiatal hernia    . Hyperlipidemia    . Hypertensive disorder     Well controlled on med. Denies any SOB in last 6 months (10/03/15)   . Morbid obesity with BMI of 40.0-44.9, adult     BMI 40.9   . Nausea without vomiting    . OSA on CPAP 2015    CPAP nightly x 1 yr   . Osteoarthritis of knees, bilateral    . TIA (transient ischemic attack) 2014    TIA 2014-no residual. Patient evaulated for possible TIA 07/12/2015  @ [facility] (dischage summary in epic)-per summary CT of head was normal and patient was d/c'd       Past  Surgical History:  Past Surgical History:   Procedure Laterality Date   . APPENDECTOMY  >25 yrs   . EGD  01/2015   . fallopian tube surgery  >25 yrs   . INSERTION, PAIN PUMP (MEDICAL) N/A 10/22/2015    Procedure: INSERTION, PAIN PUMP (MEDICAL);  Surgeon: Nicola Police, DO;  Location: [place] MAIN OR;  Service: General;  Laterality: N/A;   . LAPAROSCOPIC, CHOLECYSTECTOMY, CHOLANGIOGRAM  08/13/2014   . LAPAROSCOPIC, GASTRIC BYPASS N/A 10/22/2015    Procedure: LAPAROSCOPIC, GASTRIC BYPASS;  Surgeon: Josefa Half R, DO;  Location: [place] MAIN OR;  Service: General;  Laterality: N/A;   . LAPAROSCOPIC, HERNIORRHAPHY, HIATAL N/A 10/22/2015    Procedure: LAPAROSCOPIC, HERNIORRHAPHY, HIATAL;  Surgeon: Jeanell Sparrow, Hamid R, DO;  Location: [place] MAIN OR;  Service: General;  Laterality: N/A;   . OVARY SURGERY Right >25 yrs   . SPINE SURGERY  11/2013    cervical HNP x 2, Dr. Bufford Buttner       Social History:      Family History:  Family History   Problem Relation Age of Onset   .  Cancer Mother 39        breast cancer       Outpatient Medication:  Previous Medications    ALBUTEROL (PROVENTIL HFA;VENTOLIN HFA) 108 (90 BASE) MCG/ACT INHALER    Inhale into the lungs every 4 (four) hours as needed.       AMLODIPINE (NORVASC) 5 MG TABLET    Take 1 tablet (5 mg total) by mouth daily.    ATENOLOL (TENORMIN) 50 MG TABLET    Take 1 tablet (50 mg total) by mouth daily.    B COMPLEX VITAMINS (VITAMIN-B COMPLEX) TAB    Take  by Mouth Once a Day.    BUTALBITAL-ACETAMINOPHEN-CAFFEINE (FIORICET, ESGIC) 50-325-40 MG PER TABLET    Take 1 tablet by mouth every 6 (six) hours as needed for Pain.    CHOLECALCIFEROL (VITAMIN D-1000 MAX ST) 1000 UNITS TABLET    Take 1,000 Units by Mouth Once a Day.    DESONIDE (DESOWEN) 0.05 % CREAM    Apply topically 2 (two) times daily as needed.    DIAZEPAM (VALIUM) 2 MG TABLET    Take 1 tablet (2 mg total) by mouth every 8 (eight) hours as needed for Anxiety.    DOCUSATE SODIUM (COLACE) 100 MG CAPSULE     Take 100 mg by mouth daily.        FAMOTIDINE (PEPCID) 20 MG TABLET    Take 1 tablet (20 mg total) by mouth 2 (two) times daily.    FLUTICASONE (FLONASE) 50 MCG/ACT NASAL SPRAY    2 sprays by Nasal route daily.    PANTOPRAZOLE (PROTONIX) 40 MG TABLET    TAKE 1 TABLET(40 MG) BY MOUTH DAILY    SUCRALFATE (CARAFATE) 1 GM/10ML SUSPENSION    Take 10 mLs (1 g total) by mouth 4 (four) times daily.    VALACYCLOVIR HCL (VALTREX) 500 MG TABLET    TAKE 1 TABLET(500 MG) BY MOUTH DAILY    VITAMIN A 16109 UNIT CAPSULE    Take 10,000 Units by Mouth Once a Day.         REVIEW OF SYSTEMS   Review of Systems  Constitutional: Negative for fever and chills.   HENT: Negative for congestion and sore throat.  Eyes: Negative for eye discharge and eye redness.   MSK: Positive for rash  Neurological: Negative for dizziness, focal weakness, numbness.   All other systems negative.  PHYSICAL EXAM     ED Triage Vitals [07/03/17 0804]   Enc Vitals Group      BP 144/74      Heart Rate 69      Resp Rate 16      Temp 98.4 F (36.9 C)      Temp Source Oral      SpO2 100 %      Weight 59 kg      Height 1.549 m      Head Circumference       Peak Flow       Pain Score 0      Pain Loc       Pain Edu?       Excl. in GC?        Constitutional: Vital signs reviewed. Well appearing, well hydrated, well perfused, non-toxic appearing, no apparent distress  Head:  Normocephalic, atraumatic  Eyes: PERRL, normal conjunctiva bilaterally, EOMI  ENT: Mucous membranes moist.  .  Neck: Normal range of motion. Non-tender.   UpperExtremity: No edema or cyanosis.  LowerExtremity: No edema or cyanosis.  Neurological: Awake and alert. No focal motor deficits by observation.  Skin: Warm and dry. Noted urticaria on posterior back, right arm, and lower back, raised with no drainage   Lymphatic: No cervical lymphadenopathy.    MEDICAL DECISION MAKING     66 y.o. femalehx of GERD, HPL, HTN, who presents with pruritic rash    Will treat for urticaria     rx for medrol dose pack  + benadryl   Pt reports she will be driving - given prednisone and pepcid in ED     Outpatient follow-up with PCP given       DISCUSSION        Vital Signs: Reviewed the patient?s vital signs.   Nursing Notes: Reviewed and utilized available nursing notes.  Medical Records Reviewed: Reviewed available past medical records.  Counseling: The emergency provider has spoken with the patient and discussed today?s findings, in addition to providing specific details for the plan of care.  Questions are answered and there is agreement with the plan.      CARDIAC STUDIES    The following cardiac studies were independently interpreted by the Emergency Medicine Physician.  For full cardiac study results please see chart.    Monitor Strip  Interpreted by ED Physician  Rate: 80  Rhythm: NSR   ST Changes: none      IMAGING STUDIES    The following imaging studies were independently interpreted by the Emergency Medicine Physician.  For full imaging study results please see chart.      PULSE OXIMETRY    Oxygen Saturation by Pulse Oximetry: 100%  Interventions: none  Interpretation:  Normal     EMERGENCY DEPT. MEDICATIONS      ED Medication Orders     Start Ordered     Status Ordering Provider    07/03/17 [phone] 07/03/17 0822  predniSONE (DELTASONE) tablet 60 mg  Once     Route: Oral  Ordered Dose: 60 mg     Acknowledged ADJEI-TWUM, Greely Atiyeh    07/03/17 0823 07/03/17 0822  famotidine (PEPCID) tablet 20 mg  Once     Route: Oral  Ordered Dose: 20 mg     Acknowledged Rulon Abide    07/03/17 0823 07/03/17 0822    Once in ED     Route: Oral  Ordered Dose: 10 mg     Discontinued ADJEI-TWUM, Birttany Dechellis          LABORATORY RESULTS    Ordered and independently interpreted AVAILABLE laboratory tests. Please see results section in chart for full details.    DIAGNOSIS      Diagnosis:  Final diagnoses:   Urticaria       Disposition:  ED Disposition     ED Disposition Condition Date/Time Comment    Discharge  Sat Jul 03, 2017  8:25 AM [name] discharge to home/self care.    Condition at disposition: Stable          Prescriptions:  Patient's Medications   New Prescriptions    DIPHENHYDRAMINE (BENADRYL) 25 MG TABLET    Take 1-2 tablets (25-50 mg total) by mouth every 6 (six) hours as needed for Itching.    METHYLPREDNISOLONE (MEDROL DOSPACK) 4 MG TABLET    Use as directed   Previous Medications    ALBUTEROL (PROVENTIL HFA;VENTOLIN HFA) 108 (90 BASE) MCG/ACT INHALER    Inhale into the lungs every 4 (four) hours as needed.       AMLODIPINE (NORVASC) 5 MG TABLET  Take 1 tablet (5 mg total) by mouth daily.    ATENOLOL (TENORMIN) 50 MG TABLET    Take 1 tablet (50 mg total) by mouth daily.    B COMPLEX VITAMINS (VITAMIN-B COMPLEX) TAB    Take  by Mouth Once a Day.    BUTALBITAL-ACETAMINOPHEN-CAFFEINE (FIORICET, ESGIC) 50-325-40 MG PER TABLET    Take 1 tablet by mouth every 6 (six) hours as needed for Pain.    CHOLECALCIFEROL (VITAMIN D-1000 MAX ST) 1000 UNITS TABLET    Take 1,000 Units by Mouth Once a Day.    DESONIDE (DESOWEN) 0.05 % CREAM    Apply topically 2 (two) times daily as needed.    DIAZEPAM (VALIUM) 2 MG TABLET    Take 1 tablet (2 mg total) by mouth every 8 (eight) hours as needed for Anxiety.    DOCUSATE SODIUM (COLACE) 100 MG CAPSULE    Take 100 mg by mouth daily.        FAMOTIDINE (PEPCID) 20 MG TABLET    Take 1 tablet (20 mg total) by mouth 2 (two) times daily.    FLUTICASONE (FLONASE) 50 MCG/ACT NASAL SPRAY    2 sprays by Nasal route daily.    PANTOPRAZOLE (PROTONIX) 40 MG TABLET    TAKE 1 TABLET(40 MG) BY MOUTH DAILY    SUCRALFATE (CARAFATE) 1 GM/10ML SUSPENSION    Take 10 mLs (1 g total) by mouth 4 (four) times daily.    VALACYCLOVIR HCL (VALTREX) 500 MG TABLET    TAKE 1 TABLET(500 MG) BY MOUTH DAILY    VITAMIN A 09811 UNIT CAPSULE    Take 10,000 Units by Mouth Once a Day.   Modified Medications    No medications on file   Discontinued Medications    No medications on file            Rulon Abide, MD  07/03/17 [phone]

## 2017-07-03 NOTE — Discharge Instructions (Signed)
Urticaria     You have been diagnosed with hives. The medical term for hives is "urticaria."     Hives are raised, red areas on the skin. They often appear suddenly. They come and go quickly on different areas of the skin. Sometimes they are from an allergic reaction. Most of the time there is no cause we can find.     Some causes of hives are sunlight, viral infections, heat or cold or bee stings. Food can be the cause, especially eggs, fish, nuts, peanuts and milk. Other causes are detergents, perfumes, plants, animals and medicines. If you do not already know the cause of your hives, make a list at home of anything that may be new in your life. If you find a cause, stay away from it from now on.     Oral (by mouth) diphenhydramine (Benadryl®) can help with itching. You can get it over the counter (without a prescription). It is available at any pharmacy. Follow the package instructions.     If the hives keep coming back and there is no known cause, talk to your doctor about allergy testing.     YOU SHOULD SEEK MEDICAL ATTENTION IMMEDIATELY, EITHER HERE OR AT THE NEAREST EMERGENCY DEPARTMENT, IF ANY OF THE FOLLOWING OCCURS:  · The rash gets significantly worse.  · Trouble breathing (fast shallow breaths, wheezing, blueness around the lips or fingernails) or trouble swallowing. Call 911 if this problem develops!

## 2017-07-07 ENCOUNTER — Telehealth (INDEPENDENT_AMBULATORY_CARE_PROVIDER_SITE_OTHER): Payer: Self-pay

## 2017-07-07 NOTE — Telephone Encounter (Signed)
ED Follow Up CALL:    EDVisit Date: 07/02/17 and 07/03/17  Primary Discharge Dx: chest pain  Secondary Dx: urticaria  Follow up Appt with PCP: 0  Call placed to patient to follow up recent hospital discharge, assess current status, address questions/concerns regarding discharge instructions / medications and encourage patient to schedule Hospital Discharge follow up appt with PCP; No Answer; LVMM with contact information and request for return call.  Will continue to follow.

## 2017-07-08 ENCOUNTER — Ambulatory Visit (INDEPENDENT_AMBULATORY_CARE_PROVIDER_SITE_OTHER): Payer: Commercial Managed Care - HMO | Admitting: Family Medicine

## 2017-07-08 ENCOUNTER — Encounter (INDEPENDENT_AMBULATORY_CARE_PROVIDER_SITE_OTHER): Payer: Self-pay | Admitting: Family Medicine

## 2017-07-08 ENCOUNTER — Other Ambulatory Visit (INDEPENDENT_AMBULATORY_CARE_PROVIDER_SITE_OTHER): Payer: Self-pay | Admitting: Internal Medicine

## 2017-07-08 VITALS — BP 164/87 | HR 54 | Temp 98.0°F | Resp 12 | Wt 135.0 lb

## 2017-07-08 DIAGNOSIS — L509 Urticaria, unspecified: Secondary | ICD-10-CM

## 2017-07-08 DIAGNOSIS — R0789 Other chest pain: Secondary | ICD-10-CM

## 2017-07-08 DIAGNOSIS — A6000 Herpesviral infection of urogenital system, unspecified: Secondary | ICD-10-CM

## 2017-07-08 NOTE — Progress Notes (Signed)
Have you seen any specialists/other providers since your last visit with us?    No    Arm preference verified?   No    The patient is due for mammogram, influenza vaccine and shingles vaccine

## 2017-07-08 NOTE — Progress Notes (Signed)
Subjective:      Patient ID: Amber [name] is a 66 y.o. female.    Chief Complaint:  Chief Complaint   Patient presents with   . Follow-up     the patient went to the ER  on 07/02/2017 for chest pain  [place].   . Urticaria     various pairt of the body       HPI:  66 yo female pt who coming today for routine follow up after ED evaluation and treatment for atypical chest pain and urticaria. Pt report feeling better of her symptomatology, denies any chest pain for last 6 days, skin rash improving, only c/o mild residual itching.        Problem List:  Patient Active Problem List   Diagnosis   . Essential hypertension   . Anxiety state, unspecified   . Abnormal electrocardiogram   . Cerebral infarction   . Gastroesophageal reflux disease   . Hypernatremia   . Paresthesia   . Slow transit constipation   . Vitamin D deficiency   . Overactive bladder   . S/P laparoscopic cholecystectomy   . Transient cerebral ischemia, unspecified type   . Palpitations   . Pituitary microadenoma   . Dyslipidemia   . Other long term (current) drug therapy   . Headache   . History of spinal surgery   . Obstructive sleep apnea syndrome   . Osteoarthritis of knees, bilateral   . Pre-op evaluation   . Bariatric surgery status   . Nutritional deficiency   . Anxiety   . Epigastric abdominal tenderness without rebound tenderness   . Other chest pain   . Abdominal pain       Current Medications:  Current Outpatient Prescriptions   Medication Sig Dispense Refill   . albuterol (PROVENTIL HFA;VENTOLIN HFA) 108 (90 BASE) MCG/ACT inhaler Inhale into the lungs every 4 (four) hours as needed.        Marland Kitchen amLODIPine (NORVASC) 5 MG tablet Take 1 tablet (5 mg total) by mouth daily. 30 tablet 5   . atenolol (TENORMIN) 50 MG tablet Take 1 tablet (50 mg total) by mouth daily. 90 tablet 1   . B Complex Vitamins (VITAMIN-B COMPLEX) Tab Take  by Mouth Once a Day.     . butalbital-acetaminophen-caffeine (FIORICET, ESGIC) 50-325-40 MG per tablet Take 1  tablet by mouth every 6 (six) hours as needed for Pain. 60 tablet 0   . cholecalciferol (VITAMIN D-1000 MAX ST) 1000 units tablet Take 1,000 Units by Mouth Once a Day.     . desonide (DESOWEN) 0.05 % cream Apply topically 2 (two) times daily as needed. 60 g 2   . diazePAM (VALIUM) 2 MG tablet Take 1 tablet (2 mg total) by mouth every 8 (eight) hours as needed for Anxiety. 30 tablet 2   . diphenhydrAMINE (BENADRYL) 25 MG tablet Take 1-2 tablets (25-50 mg total) by mouth every 6 (six) hours as needed for Itching. 25 tablet 0   . docusate sodium (COLACE) 100 MG capsule Take 100 mg by mouth daily.         . famotidine (PEPCID) 20 MG tablet Take 1 tablet (20 mg total) by mouth 2 (two) times daily. 15 tablet 0   . fluticasone (FLONASE) 50 MCG/ACT nasal spray 2 sprays by Nasal route daily. (Patient taking differently: 2 sprays by Nasal route as needed.   ) 16 g 2   . methylPREDNISolone (MEDROL DOSPACK) 4 MG tablet Use as directed 21  tablet 0   . pantoprazole (PROTONIX) 40 MG tablet TAKE 1 TABLET(40 MG) BY MOUTH DAILY 90 tablet 0   . sucralfate (CARAFATE) 1 GM/10ML suspension Take 10 mLs (1 g total) by mouth 4 (four) times daily. 420 mL 0   . valACYclovir HCL (VALTREX) 500 MG tablet TAKE 1 TABLET(500 MG) BY MOUTH DAILY 90 tablet 0   . vitamin A 16109 UNIT capsule Take 10,000 Units by Mouth Once a Day.       No current facility-administered medications for this visit.        Allergies:  Allergies   Allergen Reactions   . Acyclovir Nausea And Vomiting   . Amoxicillin-Pot Clavulanate      Other reaction(s): gi distress   . Aspirin Nausea And Vomiting     Other reaction(s): gi distress  Upsets stomach  Able to tolerate enteric coated aspirin   . Atorvastatin      Palpitation   . Lovastatin Nausea And Vomiting   . Metronidazole Nausea And Vomiting   . Moxifloxacin Nausea And Vomiting     GI symptoms   . Other Nausea And Vomiting   . Rosuvastatin      Palpitation, chest pain, abd pain    . Statins    . Sulfa Antibiotics Nausea  And Vomiting     Other reaction(s): gi distress       Past Medical History:  Past Medical History:   Diagnosis Date   . Chest pain 2016     Chest pain after eating x6  months--being followed by GI for GERD   . Constipation    . Difficulty walking     amb with cane secondary to arthritis bil knees   . Genital herpes     no current outbreak (10/03/15)   . GERD (gastroesophageal reflux disease)    . Hiatal hernia    . Hyperlipidemia    . Hypertensive disorder     Well controlled on med. Denies any SOB in last 6 months (10/03/15)   . Morbid obesity with BMI of 40.0-44.9, adult     BMI 40.9   . Nausea without vomiting    . OSA on CPAP 2015    CPAP nightly x 1 yr   . Osteoarthritis of knees, bilateral    . TIA (transient ischemic attack) 2014    TIA 2014-no residual. Patient evaulated for possible TIA 07/12/2015  @ [facility] (dischage summary in epic)-per summary CT of head was normal and patient was d/c'd       Past Surgical History:  Past Surgical History:   Procedure Laterality Date   . APPENDECTOMY  >25 yrs   . EGD  01/2015   . fallopian tube surgery  >25 yrs   . INSERTION, PAIN PUMP (MEDICAL) N/A 10/22/2015    Procedure: INSERTION, PAIN PUMP (MEDICAL);  Surgeon: Nicola Police, DO;  Location: [place] MAIN OR;  Service: General;  Laterality: N/A;   . LAPAROSCOPIC, CHOLECYSTECTOMY, CHOLANGIOGRAM  08/13/2014   . LAPAROSCOPIC, GASTRIC BYPASS N/A 10/22/2015    Procedure: LAPAROSCOPIC, GASTRIC BYPASS;  Surgeon: Josefa Half R, DO;  Location: [place] MAIN OR;  Service: General;  Laterality: N/A;   . LAPAROSCOPIC, HERNIORRHAPHY, HIATAL N/A 10/22/2015    Procedure: LAPAROSCOPIC, HERNIORRHAPHY, HIATAL;  Surgeon: Jeanell Sparrow, Hamid R, DO;  Location:  MAIN OR;  Service: General;  Laterality: N/A;   . OVARY SURGERY Right >25 yrs   . SPINE SURGERY  11/2013    cervical HNP x 2,  Dr. Bufford Buttner       Family History:  Family History   Problem Relation Age of Onset   . Cancer Mother 23        breast cancer       Social  History:  Social History     Social History   . Marital status: Single     Spouse name: N/A   . Number of children: 2   . Years of education: N/A     Occupational History   . legal instrument examiner [employer]      Social History Main Topics   . Smoking status: Never Smoker   . Smokeless tobacco: Never Used   . Alcohol use No   . Drug use: No   . Sexual activity: No     Other Topics Concern   . Dietary Supplements / Vitamins Yes   . Anesthesia Problems No   . Blood Thinners No   . Eats Large Amounts No   . Excessive Sweets No   . Skips Meals No   . Eats Excessive Starches Yes   . Snacks Or Grazes No   . Emotional Eater Yes   . Eats Fried Food Yes   . Eats Fast Food Yes   . Diet Center No   . Hmr No   . [name] No   . La Weight Loss No   . Nutri-System No   . Opti-Fast / Medi-Fast No   . Overeaters Anonymous No   . [facility] Weight Loss Center No   . Tops No   . Weight Watchers No   . Atkins No   . Binging / Purging No   . Body For Life No   . Cabbage Soup No   . Calorie Counting No   . Fasting No   . [name] No   . Health Spa No   . Herbal Life No   . High Protein No   . Low Carb No   . Low Fat No   . [facility] Diet No   . [place] Diet No   . Margie Billet Diet No   . [place] Diet No   . Slim Fast No   . [place] No   . Sugar Busters No   . Vomiting No   . Zone Diet No   . Stationary Cycle Or Treadmill No   . Gym/Fitness Classes No   . Home Exercise/Video No   . Swimming No   . Team Sports No   . Weight Training No   . Walking Or Running No   . Hospitalization No   . Hypnosis No   . Physical Therapy No   . Psychological Therapy No   . Residential Program No   . Acutrim No   . Amphetamines No   . Anorex No   . Byetta No   . Dexatrim No   . Didrex No   . Fastin No   . Fen - Phen No   . Ionamin / Adipex No   . Mazanor No   . Meridia No   . Obalan No   . Phendiet No   . Phentrol No   . Phenteramine Yes   . Plegine No   . Pondimin No   . Qsymia No   . Prozac No   . Redux No   . Sanorex  No   . Ten[place] No   . Tepanole No   . Merrilee Jansky  No   . Wellbutrin No   . Xenical (Orlistat, Alli) No   . Other Med No   . No Impairment Yes     Chronic Bilat Knee Pain   . Walks With Cane/Crutch Yes   . Requires A Wheelchair No   . Bedridden No   . Are You Currently Being Treated For Depression? No   . Do You Snore? Yes   . Are You Receiving Any Medical Or Psychological Services? No   . Do You Have Or Have You Been Treated For An Eating Disorder? No   . Do You Exercise Regularly? No   . Have You Or Family Member Ever Have Trouble With Anesthesia? No     Social History Narrative   . No narrative on file       The following sections were reviewed this encounter by the provider:        ROS:  Review of Systems  Constitution:  Patient denies appetite changes, chills, fatigue, fever.  Skin:  +skin rash  Eyes:  Patient denies discharge, itching, redness, blurred vision.  HENT:  Patient denies congestion, ear discharge/pain, facial swelling, mouth sores, nosebleeds.  Respiratory:  Patient denies SOB, tightness, cough, wheezing.  Cardiovascular:  Patient denies  leg swelling, palpitations.  +chest pain   Gastrointestinal:  Patient denies Abd distention/pain, Abd masses, blood in stool, rectal pain/swelling/bleeding, constipation, nausea, vomiting, diarrhea.  Genitourinary:  Patient denies dysuria, flank pain, hematuria, urgency  Musculoskeletal:  Patient denies arthralgias, back pain, gait problem, myalgias, neck pain/stiffness  Endocrine:  Patient denies any cold/heat intolerance, polydipsia, polyphagia, polyuria.  Neurological:  Patient denies weakness, numbness, tingling, facial drop.  Psychiatric:  Denies any depression, anxiety, insomnia, si/hi/pi     Vitals:  BP 164/87   Pulse (!) 54   Temp 98 F (36.7 C) (Oral)   Resp 12   Wt 61.2 kg (135 lb)   BMI 25.51 kg/m      Objective:     Physical Exam:  Physical Exam    Patient alert, oriented x 3, no distress, comfortable  Skin: Rhere are multiple macular dark lesion on  arms, neck and back   Oropharynx: Lips, mucosa, and tongue normal; Teeth and gums normal  Eyes: Conjunctivae and corneas clear, PERRLA, OEM's intact  Ears: Normal TM's and clear external canal bilaterally  Neck: No adenopathy, no JVD, supple, symmetrical, trachea midline and Thyroid not enlarged, no tenderness or masses visible  Lungs: Breaths sounds present bilaterally, clear to auscultation, no wheezing, no rales, no rhonchi   Heart: Regular rate and rhythm, S1-S2 present, no murmurs, no rubs, no gallops   Abdomen: Soft, non tender, no masses palpable, bowel sounds present and normal, no rebound tenderness, no guarding, no distension, no hernias visible.  Extremities: Extremities normal, atraumatic, no cyanosis or edema  Neuro: Normal without focal finding, mental status, speech normal, normal reflexes and strength.  Psychiatric: Normal mood and affect. behavior is normal. Judgment and thought content normal.     Assessment:     1. Atypical chest pain    2. Urticaria      Plan:     Chest pain resilved.  ED evaluation for possible ACS negative.  Pt currently on Prednisone/BENADRYL  Cont medications.  No need for further evaluation or treatments   RTC 1 month    Tonia Brooms, MD

## 2017-07-13 ENCOUNTER — Other Ambulatory Visit (INDEPENDENT_AMBULATORY_CARE_PROVIDER_SITE_OTHER): Payer: Self-pay | Admitting: Internal Medicine

## 2017-07-13 MED ORDER — DIAZEPAM 2 MG PO TABS
2.0000 mg | ORAL_TABLET | Freq: Three times a day (TID) | ORAL | 2 refills | Status: DC | PRN
Start: 2017-07-13 — End: 2018-09-02

## 2017-07-13 MED ORDER — VALACYCLOVIR HCL 500 MG PO TABS
ORAL_TABLET | ORAL | 0 refills | Status: DC
Start: 2017-07-13 — End: 2017-09-30

## 2017-07-16 ENCOUNTER — Emergency Department
Admission: EM | Admit: 2017-07-16 | Discharge: 2017-07-16 | Disposition: A | Discharge status: Home / Self Care | Payer: Commercial Managed Care - HMO | Attending: Emergency Medicine | Admitting: Emergency Medicine

## 2017-07-16 ENCOUNTER — Emergency Department: Payer: Commercial Managed Care - HMO

## 2017-07-16 DIAGNOSIS — I959 Hypotension, unspecified: Secondary | ICD-10-CM | POA: Insufficient documentation

## 2017-07-16 DIAGNOSIS — K219 Gastro-esophageal reflux disease without esophagitis: Secondary | ICD-10-CM | POA: Insufficient documentation

## 2017-07-16 DIAGNOSIS — R002 Palpitations: Secondary | ICD-10-CM | POA: Insufficient documentation

## 2017-07-16 DIAGNOSIS — R079 Chest pain, unspecified: Secondary | ICD-10-CM | POA: Insufficient documentation

## 2017-07-16 DIAGNOSIS — Z7951 Long term (current) use of inhaled steroids: Secondary | ICD-10-CM | POA: Insufficient documentation

## 2017-07-16 DIAGNOSIS — I10 Essential (primary) hypertension: Secondary | ICD-10-CM | POA: Insufficient documentation

## 2017-07-16 DIAGNOSIS — G4733 Obstructive sleep apnea (adult) (pediatric): Secondary | ICD-10-CM | POA: Insufficient documentation

## 2017-07-16 DIAGNOSIS — Z79899 Other long term (current) drug therapy: Secondary | ICD-10-CM | POA: Insufficient documentation

## 2017-07-16 DIAGNOSIS — Z8673 Personal history of transient ischemic attack (TIA), and cerebral infarction without residual deficits: Secondary | ICD-10-CM | POA: Insufficient documentation

## 2017-07-16 DIAGNOSIS — E785 Hyperlipidemia, unspecified: Secondary | ICD-10-CM | POA: Insufficient documentation

## 2017-07-16 DIAGNOSIS — Z880 Allergy status to penicillin: Secondary | ICD-10-CM | POA: Insufficient documentation

## 2017-07-16 DIAGNOSIS — Z9884 Bariatric surgery status: Secondary | ICD-10-CM | POA: Insufficient documentation

## 2017-07-16 DIAGNOSIS — R031 Nonspecific low blood-pressure reading: Secondary | ICD-10-CM

## 2017-07-16 LAB — CBC AND DIFFERENTIAL
Absolute NRBC: 0 10*3/uL
Basophils Absolute Automated: 0.05 10*3/uL (ref 0.00–0.20)
Basophils Automated: 0.9 %
Eosinophils Absolute Automated: 0.08 10*3/uL (ref 0.00–0.70)
Eosinophils Automated: 1.4 %
Hematocrit: 38.2 % (ref 37.0–47.0)
Hgb: 12.5 g/dL (ref 12.0–16.0)
Immature Granulocytes Absolute: 0.01 10*3/uL
Immature Granulocytes: 0.2 %
Lymphocytes Absolute Automated: 2.09 10*3/uL (ref 0.50–4.40)
Lymphocytes Automated: 37.8 %
MCH: 28.5 pg (ref 28.0–32.0)
MCHC: 32.7 g/dL (ref 32.0–36.0)
MCV: 87 fL (ref 80.0–100.0)
MPV: 10 fL (ref 9.4–12.3)
Monocytes Absolute Automated: 0.5 10*3/uL (ref 0.00–1.20)
Monocytes: 9 %
Neutrophils Absolute: 2.8 10*3/uL (ref 1.80–8.10)
Neutrophils: 50.7 %
Nucleated RBC: 0 /100 WBC (ref 0.0–1.0)
Platelets: 292 10*3/uL (ref 140–400)
RBC: 4.39 10*6/uL (ref 4.20–5.40)
RDW: 16 % — ABNORMAL HIGH (ref 12–15)
WBC: 5.53 10*3/uL (ref 3.50–10.80)

## 2017-07-16 LAB — GFR: EGFR: >60

## 2017-07-16 LAB — COMPREHENSIVE METABOLIC PANEL
ALT: 19 U/L (ref 0–55)
AST (SGOT): 27 U/L (ref 5–34)
Albumin/Globulin Ratio: 1.3 (ref 0.9–2.2)
Albumin: 3.8 g/dL (ref 3.5–5.0)
Alkaline Phosphatase: 85 U/L (ref 37–106)
Anion Gap: 10 (ref 5.0–15.0)
BUN: 6 mg/dL — ABNORMAL LOW (ref 7.0–19.0)
Bilirubin, Total: 0.3 mg/dL (ref 0.2–1.2)
CO2: 22 mEq/L (ref 22–29)
Calcium: 8.9 mg/dL (ref 8.5–10.5)
Chloride: 113 mEq/L — ABNORMAL HIGH (ref 100–111)
Creatinine: 0.7 mg/dL (ref 0.6–1.0)
Globulin: 2.9 g/dL (ref 2.0–3.6)
Glucose: 79 mg/dL (ref 70–100)
Potassium: 4 mEq/L (ref 3.5–5.1)
Protein, Total: 6.7 g/dL (ref 6.0–8.3)
Sodium: 145 mEq/L (ref 136–145)

## 2017-07-16 LAB — TROPONIN I: Troponin I: <0.01 ng/mL (ref 0.00–0.09)

## 2017-07-16 MED ORDER — DIPHENHYDRAMINE HCL 25 MG PO CAPS
25.0000 mg | ORAL_CAPSULE | Freq: Once | ORAL | Status: AC
Start: 2017-07-16 — End: 2017-07-16
  Administered 2017-07-16: 16:00:00 25 mg via ORAL
  Filled 2017-07-16: qty 1

## 2017-07-16 NOTE — ED Notes (Signed)
Family at bedside. 

## 2017-07-16 NOTE — ED Triage Notes (Signed)
Amber [name] is a 66 y.o. female who presents to the ER with mid-sternal chest pain that began approx 1 hour at home while at rest. She describes the pain as a "heaviness" and rates it an 8/10 at this time. No headache or blurred vision; no shortness of breath; no recent long travel; +nausea. Denies diaphoresis; denies jaw pain; denies left arm pain. She also noticed some hives/rash to her right upper arm and abdomen today.

## 2017-07-16 NOTE — ED Provider Notes (Signed)
EMERGENCY DEPARTMENT NOTE    Physician/Midlevel provider first contact with patient: 07/16/17 1445         HISTORY OF PRESENT ILLNESS   Historian: patient  Translator Used: no    66 y.o. female presents with multiple complaints, primary chest pain now. She states that earlier today her BP was low in the 90's, her heart rate was fast in the 130's. This lasted for about 5 minutes. She states that around 2-230 she developed some chest pressure which persists now. She also complains of itchy bumps on her right arm and abdomen. These have been a problem prior to today. She has been seen multiple times for chest pain.    1. Location of symptoms: chest pain  2. Onset of symptoms: middle of chest  3. What was patient doing when symptoms started (Context): feels like anxiety could be causing chest, see above  4. Severity: severe  5. Timing: acute onset, constant  6. Activities that worsen symptoms: none  7. Activities that improve symptoms: none  8. Quality: pressure sensation   9. Radiation of symptoms: none  10. Associated signs and Symptoms: low blood pressure, palpitations, rash   11. Are symptoms worsening? yes          MEDICAL HISTORY     Past Medical History:  Past Medical History:   Diagnosis Date   . Chest pain 2016     Chest pain after eating x6  months--being followed by GI for GERD   . Constipation    . Difficulty walking     amb with cane secondary to arthritis bil knees   . Genital herpes     no current outbreak (10/03/15)   . GERD (gastroesophageal reflux disease)    . Hiatal hernia    . Hyperlipidemia    . Hypertensive disorder     Well controlled on med. Denies any SOB in last 6 months (10/03/15)   . Morbid obesity with BMI of 40.0-44.9, adult     BMI 40.9   . Nausea without vomiting    . OSA on CPAP 2015    CPAP nightly x 1 yr   . Osteoarthritis of knees, bilateral    . TIA (transient ischemic attack) 2014    TIA 2014-no residual. Patient evaulated for possible TIA 07/12/2015  @ [facility] (dischage summary in  epic)-per summary CT of head was normal and patient was d/c'd       Past Surgical History:  Past Surgical History:   Procedure Laterality Date   . APPENDECTOMY  >25 yrs   . EGD  01/2015   . fallopian tube surgery  >25 yrs   . INSERTION, PAIN PUMP (MEDICAL) N/A 10/22/2015    Procedure: INSERTION, PAIN PUMP (MEDICAL);  Surgeon: Nicola Police, DO;  Location: [facility];  Service: General;  Laterality: N/A;   . LAPAROSCOPIC, CHOLECYSTECTOMY, CHOLANGIOGRAM  08/13/2014   . LAPAROSCOPIC, GASTRIC BYPASS N/A 10/22/2015    Procedure: LAPAROSCOPIC, GASTRIC BYPASS;  Surgeon: Josefa Half R, DO;  Location: [facility];  Service: General;  Laterality: N/A;   . LAPAROSCOPIC, HERNIORRHAPHY, HIATAL N/A 10/22/2015    Procedure: LAPAROSCOPIC, HERNIORRHAPHY, HIATAL;  Surgeon: Jeanell Sparrow, Hamid R, DO;  Location: [facility];  Service: General;  Laterality: N/A;   . OVARY SURGERY Right >25 yrs   . SPINE SURGERY  11/2013    cervical HNP x 2, Dr. Bufford Buttner       Social History:  Social History  Social History   . Marital status: Single     Spouse name: N/A   . Number of children: 2   . Years of education: N/A     Occupational History   . legal instrument examiner [employer]      Social History Main Topics   . Smoking status: Never Smoker   . Smokeless tobacco: Never Used   . Alcohol use No   . Drug use: No   . Sexual activity: No     Other Topics Concern   . Dietary Supplements / Vitamins Yes   . Anesthesia Problems No   . Blood Thinners No   . Eats Large Amounts No   . Excessive Sweets No   . Skips Meals No   . Eats Excessive Starches Yes   . Snacks Or Grazes No   . Emotional Eater Yes   . Eats Fried Food Yes   . Eats Fast Food Yes   . Diet Center No   . Hmr No   . [name] No   . La Weight Loss No   . Nutri-System No   . Opti-Fast / Medi-Fast No   . Overeaters Anonymous No   . Physicians Weight Loss Center No   . Tops No   . Weight Watchers No   . Atkins No   . Binging / Purging No   . Body For  Life No   . Cabbage Soup No   . Calorie Counting No   . Fasting No   . [name] No   . Health Spa No   . Herbal Life No   . High Protein No   . Low Carb No   . Low Fat No   . [facility] Diet No   . [place] Diet No   . Margie Billet Diet No   . [place] Diet No   . Slim Fast No   . [place] No   . Sugar Busters No   . Vomiting No   . Zone Diet No   . Stationary Cycle Or Treadmill No   . Gym/Fitness Classes No   . Home Exercise/Video No   . Swimming No   . Team Sports No   . Weight Training No   . Walking Or Running No   . Hospitalization No   . Hypnosis No   . Physical Therapy No   . Psychological Therapy No   . Residential Program No   . Acutrim No   . Amphetamines No   . Anorex No   . Byetta No   . Dexatrim No   . Didrex No   . Fastin No   . Fen - Phen No   . Ionamin / Adipex No   . Mazanor No   . Meridia No   . Obalan No   . Phendiet No   . Phentrol No   . Phenteramine Yes   . Plegine No   . Pondimin No   . Qsymia No   . Prozac No   . Redux No   . Sanorex No   . Tenuate No   . Tepanole No   . Wechless No   . Wellbutrin No   . Xenical (Orlistat, Alli) No   . Other Med No   . No Impairment Yes     Chronic Bilat Knee Pain   . Walks With Cane/Crutch Yes   . Requires A Wheelchair No   . Bedridden  No   . Are You Currently Being Treated For Depression? No   . Do You Snore? Yes   . Are You Receiving Any Medical Or Psychological Services? No   . Do You Have Or Have You Been Treated For An Eating Disorder? No   . Do You Exercise Regularly? No   . Have You Or Family Member Ever Have Trouble With Anesthesia? No     Social History Narrative   . No narrative on file       Family History:  Family History   Problem Relation Age of Onset   . Cancer Mother 37        breast cancer       Outpatient Medication:  Discharge Medication List as of 07/16/2017  4:03 PM      CONTINUE these medications which have NOT CHANGED    Details   albuterol (PROVENTIL HFA;VENTOLIN HFA) 108 (90 BASE) MCG/ACT inhaler Inhale into the lungs  every 4 (four) hours as needed.   , Starting 01/26/2012, Until Discontinued, Historical Med      amLODIPine (NORVASC) 5 MG tablet Take 1 tablet (5 mg total) by mouth daily., Starting Thu 04/15/2017, Until Fri 04/15/2018, Normal      atenolol (TENORMIN) 50 MG tablet Take 1 tablet (50 mg total) by mouth daily., Starting Mon 06/14/2017, Normal      B Complex Vitamins (VITAMIN-B COMPLEX) Tab Take  by Mouth Once a Day., Historical Med      butalbital-acetaminophen-caffeine (FIORICET, ESGIC) 50-325-40 MG per tablet Take 1 tablet by mouth every 6 (six) hours as needed for Pain., Starting Mon 06/14/2017, Normal      cholecalciferol (VITAMIN D-1000 MAX ST) 1000 units tablet Take 1,000 Units by Mouth Once a Day., Historical Med      desonide (DESOWEN) 0.05 % cream Apply topically 2 (two) times daily as needed., Starting 11/07/2014, Until Discontinued, Normal      diazePAM (VALIUM) 2 MG tablet Take 1 tablet (2 mg total) by mouth every 8 (eight) hours as needed for Anxiety., Starting Tue 07/13/2017, Print      diphenhydrAMINE (BENADRYL) 25 MG tablet Take 1-2 tablets (25-50 mg total) by mouth every 6 (six) hours as needed for Itching., Starting Sat 07/03/2017, Print      docusate sodium (COLACE) 100 MG capsule Take 100 mg by mouth daily.    , Until Discontinued, Historical Med      famotidine (PEPCID) 20 MG tablet Take 1 tablet (20 mg total) by mouth 2 (two) times daily., Starting Sat 06/05/2017, Print      fluticasone (FLONASE) 50 MCG/ACT nasal spray 2 sprays by Nasal route daily., Starting 01/02/2014, Until Discontinued, Normal      methylPREDNISolone (MEDROL DOSPACK) 4 MG tablet Use as directed, Print      pantoprazole (PROTONIX) 40 MG tablet TAKE 1 TABLET(40 MG) BY MOUTH DAILY, Normal      sucralfate (CARAFATE) 1 GM/10ML suspension Take 10 mLs (1 g total) by mouth 4 (four) times daily., Starting Fri 07/02/2017, Normal      valACYclovir HCL (VALTREX) 500 MG tablet TAKE 1 TABLET(500 MG) BY MOUTH DAILY, Normal      vitamin A 16109 UNIT  capsule Take 10,000 Units by Mouth Once a Day., Historical Med             Allergies:  Allergies   Allergen Reactions   . Acyclovir Nausea And Vomiting   . Amoxicillin-Pot Clavulanate      Other reaction(s): gi distress   .  Aspirin Nausea And Vomiting     Other reaction(s): gi distress  Upsets stomach  Able to tolerate enteric coated aspirin   . Atorvastatin      Palpitation   . Lovastatin Nausea And Vomiting   . Metronidazole Nausea And Vomiting   . Moxifloxacin Nausea And Vomiting     GI symptoms   . Other Nausea And Vomiting   . Rosuvastatin      Palpitation, chest pain, abd pain    . Statins    . Sulfa Antibiotics Nausea And Vomiting     Other reaction(s): gi distress       REVIEW OF SYSTEMS   Review of Systems   Respiratory: Negative for shortness of breath.    Cardiovascular: Positive for chest pain. Negative for leg swelling.   All other systems reviewed and are negative.        PHYSICAL EXAM     Vitals:    07/16/17 1605   BP: 146/84   Pulse: 72   Resp: 18   Temp:    SpO2: 97%       Nursing note and vitals reviewed.    Constitutional: non-toxic appearing  Head: Atraumatic.  Eyes: PERRL. EOMI. No scleral icterus.  ENT: Mucous membranes are moist and intact. Oropharynx is clear. Patent airway.  Neck: Supple. No cervical lymphadenopathy.  Cardiovascular: Regular rate. Regular rhythm. No murmurs, rubs, or gallops.  Pulmonary/Chest: No evidence of respiratory distress. Clear to auscultation bilaterally. No wheezing, rales or rhonchi.   GI: Soft, non-distended abdomen. No tenderness to palpation of abdomen.  Extremities: No edema. No deformity.  Skin: erythematous welt on right arm and right abdominal wall  Neurological: Awake, alert and oriented x 3. CN II-XII intact. Strength intact. Sensation intact.  Psychiatric: Appropriate affect. Appropriate mood. Appropriate behavior.    MEDICAL DECISION MAKING   Chest pain. Unclear etiology. Seems to be chronic complaint reviewing her chart. I feel that ACS is less likely  but not excluded. EKG, trop to rule it out. CXR to rule out infiltrate, ptx, aortic problem.  Palpitations. NSR here now. Will put on cardiac monitor.  Rash. Unclear cause.    Trop negative. Reviewed cxr read. Reviewed other labs. No arrythmia by ekg or monitor. I don't suspect any life threatening cause of patient's illness today. I recommended follow up with her pcp and/or cardiologist. Return precautions given.    DISCUSSION      Vital Signs: Reviewed the patient?s vital signs.   Nursing Notes: Reviewed and utilized available nursing notes.  Medical Records Reviewed: Reviewed available past medical records.  Counseling: The emergency provider has spoken with the patient and discussed today?s findings, in addition to providing specific details for the plan of care.  Questions are answered and there is agreement with the plan.    IMAGING STUDIES    The following imaging studies were independently interpreted by the Emergency Medicine Physician.  For full imaging study results please see chart.    CARDIAC STUDIES     The following cardiac studies were independently interpreted by the Emergency Medicine Physician. For full cardiac study results please see chart     Monitor Strip   Interpreted by ED Physician   Rate: 78  Rhythm: nsr  ST Changes: none    EKG Interpretation:   Signed and interpreted by ED Physician   Time Interpreted: 1438  Comparison:   Rate: 88  Rhythm: nsr   Axis:    Intervals: normal  Blocks: none  ST  segments: LVH  Interpretation: abnormal EKG    PULSE OXIMETRY    Oxygen Saturation by Pulse Oximetry: 100% RA  Interventions: none  Interpretation: normal    EMERGENCY DEPT. MEDICATIONS      ED Medication Orders     Start Ordered     Status Ordering Provider    07/16/17 1610 07/16/17 1609  diphenhydrAMINE (BENADRYL) capsule 25 mg  Once     Route: Oral  Ordered Dose: 25 mg     Last MAR action:  Given Sariah Henkin WINDSOR          LABORATORY RESULTS    Ordered and independently interpreted AVAILABLE  laboratory tests. Please see results section in chart for full details.  Results for orders placed or performed during the hospital encounter of 07/16/17   Comprehensive metabolic panel   Result Value Ref Range    Glucose 79 70 - 100 mg/dL    BUN 6.0 (L) 7.0 - 16.1 mg/dL    Creatinine 0.7 0.6 - 1.0 mg/dL    Sodium 096 045 - 409 mEq/L    Potassium 4.0 3.5 - 5.1 mEq/L    Chloride 113 (H) 100 - 111 mEq/L    CO2 22 22 - 29 mEq/L    Calcium 8.9 8.5 - 10.5 mg/dL    Protein, Total 6.7 6.0 - 8.3 g/dL    Albumin 3.8 3.5 - 5.0 g/dL    AST (SGOT) 27 5 - 34 U/L    ALT 19 0 - 55 U/L    Alkaline Phosphatase 85 37 - 106 U/L    Bilirubin, Total 0.3 0.2 - 1.2 mg/dL    Globulin 2.9 2.0 - 3.6 g/dL    Albumin/Globulin Ratio 1.3 0.9 - 2.2    Anion Gap 10.0 5.0 - 15.0   CBC with differential   Result Value Ref Range    WBC 5.53 3.50 - 10.80 x10 3/uL    Hgb 12.5 12.0 - 16.0 g/dL    Hematocrit 81.1 91.4 - 47.0 %    Platelets 292 140 - 400 x10 3/uL    RBC 4.39 4.20 - 5.40 x10 6/uL    MCV 87.0 80.0 - 100.0 fL    MCH 28.5 28.0 - 32.0 pg    MCHC 32.7 32.0 - 36.0 g/dL    RDW 16 (H) 12 - 15 %    MPV 10.0 9.4 - 12.3 fL    Neutrophils 50.7 None %    Lymphocytes Automated 37.8 None %    Monocytes 9.0 None %    Eosinophils Automated 1.4 None %    Basophils Automated 0.9 None %    Immature Granulocyte 0.2 None %    Nucleated RBC 0.0 0.0 - 1.0 /100 WBC    Neutrophils Absolute 2.80 1.80 - 8.10 x10 3/uL    Abs Lymph Automated 2.09 0.50 - 4.40 x10 3/uL    Abs Mono Automated 0.50 0.00 - 1.20 x10 3/uL    Abs Eos Automated 0.08 0.00 - 0.70 x10 3/uL    Absolute Baso Automated 0.05 0.00 - 0.20 x10 3/uL    Absolute Immature Granulocyte 0.01 0 x10 3/uL    Absolute NRBC 0.00 0 x10 3/uL   Troponin I   Result Value Ref Range    Troponin I <0.01 0.00 - 0.09 ng/mL   GFR   Result Value Ref Range    EGFR >60.0    ECG 12 lead   Result Value Ref Range    Ventricular Rate 88 BPM  Atrial Rate 88 BPM    P-R Interval 156 ms    QRS Duration 76 ms    Q-T Interval 358 ms     QTC Calculation (Bezet) 433 ms    P Axis 31 degrees    R Axis -3 degrees    T Axis 32 degrees       ATTESTATIONS      Physician Attestation: Darlyn Read MD, have been the primary provider for Amber [name] during this Emergency Dept visit and have reviewed the chart for accuracy and agree with its content.     DIAGNOSIS      Diagnosis:  Final diagnoses:   Chest pain, unspecified type   Palpitations   Low blood pressure reading       Disposition:  ED Disposition     ED Disposition Condition Date/Time Comment    Discharge  Fri Jul 16, 2017  4:03 PM Amber [name] discharge to home/self care.    Condition at disposition: Stable          Prescriptions:  Discharge Medication List as of 07/16/2017  4:03 PM      CONTINUE these medications which have NOT CHANGED    Details   albuterol (PROVENTIL HFA;VENTOLIN HFA) 108 (90 BASE) MCG/ACT inhaler Inhale into the lungs every 4 (four) hours as needed.   , Starting 01/26/2012, Until Discontinued, Historical Med      amLODIPine (NORVASC) 5 MG tablet Take 1 tablet (5 mg total) by mouth daily., Starting Thu 04/15/2017, Until Fri 04/15/2018, Normal      atenolol (TENORMIN) 50 MG tablet Take 1 tablet (50 mg total) by mouth daily., Starting Mon 06/14/2017, Normal      B Complex Vitamins (VITAMIN-B COMPLEX) Tab Take  by Mouth Once a Day., Historical Med      butalbital-acetaminophen-caffeine (FIORICET, ESGIC) 50-325-40 MG per tablet Take 1 tablet by mouth every 6 (six) hours as needed for Pain., Starting Mon 06/14/2017, Normal      cholecalciferol (VITAMIN D-1000 MAX ST) 1000 units tablet Take 1,000 Units by Mouth Once a Day., Historical Med      desonide (DESOWEN) 0.05 % cream Apply topically 2 (two) times daily as needed., Starting 11/07/2014, Until Discontinued, Normal      diazePAM (VALIUM) 2 MG tablet Take 1 tablet (2 mg total) by mouth every 8 (eight) hours as needed for Anxiety., Starting Tue 07/13/2017, Print      diphenhydrAMINE (BENADRYL) 25 MG tablet Take 1-2 tablets (25-50  mg total) by mouth every 6 (six) hours as needed for Itching., Starting Sat 07/03/2017, Print      docusate sodium (COLACE) 100 MG capsule Take 100 mg by mouth daily.    , Until Discontinued, Historical Med      famotidine (PEPCID) 20 MG tablet Take 1 tablet (20 mg total) by mouth 2 (two) times daily., Starting Sat 06/05/2017, Print      fluticasone (FLONASE) 50 MCG/ACT nasal spray 2 sprays by Nasal route daily., Starting 01/02/2014, Until Discontinued, Normal      methylPREDNISolone (MEDROL DOSPACK) 4 MG tablet Use as directed, Print      pantoprazole (PROTONIX) 40 MG tablet TAKE 1 TABLET(40 MG) BY MOUTH DAILY, Normal      sucralfate (CARAFATE) 1 GM/10ML suspension Take 10 mLs (1 g total) by mouth 4 (four) times daily., Starting Fri 07/02/2017, Normal      valACYclovir HCL (VALTREX) 500 MG tablet TAKE 1 TABLET(500 MG) BY MOUTH DAILY, Normal  vitamin A 16109 UNIT capsule Take 10,000 Units by Mouth Once a Day., Historical Med                Marland Mcalpine, MD  07/16/17 1731

## 2017-07-16 NOTE — ED Notes (Signed)
Patient in no apparent distress @ d/c. RR unlabored, regular, skin warm and appropriate for skin tone and patient alert and oriented x4. Pain was improved since arrival. Patient educated on d/c instructions. Patient had no questions or concerns. IV access removed. Patient ambulated to waiting room with steady gait and no assistance.

## 2017-07-16 NOTE — Discharge Instructions (Signed)
Chest Pain of Unclear Etiology     You have been seen for chest pain. The cause of your pain is not yet known.     Your doctor has learned about your medical history, examined you, and checked any tests that were done. Still, it is unclear why you are having pain. The doctor thinks there is only a very small chance that your pain is caused by a life-threatening condition. Later, your primary care doctor might do more tests or check you again.     Sometimes chest pain is caused by a dangerous condition, like a heart attack, aorta injury, blood clot in the lung, or collapsed lung. It is unlikely that your pain is caused by a life-threatening condition if: Your chest pain lasts only a few seconds at a time; you are not short of breath, nauseated (sick to your stomach), sweaty, or lightheaded; your pain gets worse when you twist or bend; your pain improves with exercise or hard work.     Chest pain is serious. It is VERY IMPORTANT that you follow up with your regular doctor and seek medical attention immediately here or at the nearest Emergency Department if your symptoms become worse or they change.     YOU SHOULD SEEK MEDICAL ATTENTION IMMEDIATELY, EITHER HERE OR AT THE NEAREST EMERGENCY DEPARTMENT, IF ANY OF THE FOLLOWING OCCURS:  · Your pain gets worse.  · Your pain makes you short of breath, nauseated, or sweaty.  · Your pain gets worse when you walk, go up stairs, or exert yourself.  · You feel weak, lightheaded, or faint.  · It hurts to breathe.  · Your leg swells.  · Your symptoms get worse or you have new symptoms or concerns.              Palpitations     You have been diagnosed with "palpitations."     Palpitations are beats in the chest that feel funny or strange. They are often caused by extra heartbeats that come earlier than normal. These are either "premature atrial contractions" or "premature ventricular contractions," depending on where in the heart they happen. Palpitations often go away on their own  and often cause no serious problems. They are sometimes related to stress or lack of sleep. They can also be related too much caffeine. These symptoms can be caused by many over-the-counter (no prescription needed) cold medicines, diet pills and "natural" vitamin supplements with stimulants, often ephedrine (Ephedrine is also known by its traditional Chinese name, Ma huang).     Palpitations feel different for different people. Some patients describe a feeling of "butterflies" in the chest. Others say it feels like the heart is "flipping over" in the chest. Palpitations may happen often but should last only a second or two each time. They should not cause any chest pain, lightheadedness, dizziness or fainting.     There is no specific treatment for palpitations. However, you should avoid all caffeine and cold medications. Also avoid all natural stimulants and chocolate.     Follow up with your primary doctor in the next week to make sure your symptoms are getting better. In some cases, a Holter monitor may be ordered. A Holter monitor is a portable heart monitor. It records your heart s electrical rhythm. You will need to see your regular doctor to get the results from the test.     YOU SHOULD SEEK MEDICAL ATTENTION IMMEDIATELY, EITHER HERE OR AT THE NEAREST EMERGENCY DEPARTMENT, IF ANY OF THE FOLLOWING OCCURS:  · Lightheadedness or the feeling you might   faint.  · An unusually fast or slow heart rate.  · Chest pain or shortness of breath.  · Palpitations increase when you exercise.  · Any other worsening symptoms or concerns.

## 2017-07-16 NOTE — EDIE (Signed)
Amber [name]?NOTIFICATION?07/16/2017 14:32?[name], [name]?MRN: [id number]    This patient has registered at the [facility] Emergency [facility] ? [facility]   For more information visit: [email or web address]   Criteria met      5 ED Visits in 12 Months    Security Events  No recent Security Events currently on file    ED Care Guidelines  There are currently no ED Care Guidelines in [name] for this patient. Please check your facility's medical records system.    Care Providers  [name] has no care providers on record at this time.   E.D. Visit Count (12 mo.)  Facility Visits   [name] 4   [facility] 2   [facility] ? [facility] 3   Total 9   Note: Visits indicate total known visits.      Recent Emergency Department Visit Summary  Admit Date Facility [facility] Type Major Type Diagnoses or Chief Complaint   Jul 16, 2017 [facility] Emergency Room ? [facility] [name]  Emergency      chest pain      Jul 03, 2017 [name] Emergency Room ? [facility] [name]  Emergency      possible allergic reaction      Urticaria      Urticaria, unspecified      Jul 02, 2017 [facility] [place] [facility]  Emergency      chest pain      Chest pain, unspecified      Jun 05, 2017 [name] Emergency Room ? [facility] [name]  Emergency      Headache, throbbing pain all over, light headed, nauseated      Dizziness      Headache      Other headache syndrome      Hematemesis          Recent Inpatient Visit Summary  No recorded inpatient visits.         The above information is provided for the sole purpose of patient treatment. Use of this information beyond the terms of Data Sharing Memorandum of Understanding and License Agreement is prohibited. In certain cases not all visits may be represented. Consult the aforementioned facilities for additional information.    ? 2018 [employer] - [place], [place] - [email or web address]

## 2017-07-17 LAB — ECG 12-LEAD
Atrial Rate: 88 {beats}/min
P Axis: 31 degrees
P-R Interval: 156 ms
Q-T Interval: 358 ms
QRS Duration: 76 ms
QTC Calculation (Bezet): 433 ms
R Axis: -3 degrees
T Axis: 32 degrees
Ventricular Rate: 88 {beats}/min

## 2017-07-20 LAB — ECG 12-LEAD
Atrial Rate: 88 {beats}/min
P Axis: 31 degrees
P-R Interval: 156 ms
Q-T Interval: 358 ms
QRS Duration: 76 ms
QTC Calculation (Bezet): 433 ms
R Axis: -3 degrees
T Axis: 32 degrees
Ventricular Rate: 88 {beats}/min

## 2017-07-22 ENCOUNTER — Ambulatory Visit (INDEPENDENT_AMBULATORY_CARE_PROVIDER_SITE_OTHER): Payer: Commercial Managed Care - HMO | Admitting: Internal Medicine

## 2017-08-12 ENCOUNTER — Other Ambulatory Visit (INDEPENDENT_AMBULATORY_CARE_PROVIDER_SITE_OTHER): Payer: Self-pay | Admitting: Internal Medicine

## 2017-08-17 ENCOUNTER — Emergency Department: Payer: Commercial Managed Care - HMO

## 2017-08-17 ENCOUNTER — Emergency Department
Admission: EM | Admit: 2017-08-17 | Discharge: 2017-08-17 | Disposition: A | Discharge status: Home / Self Care | Payer: Commercial Managed Care - HMO | Attending: Emergency Medicine | Admitting: Emergency Medicine

## 2017-08-17 DIAGNOSIS — E785 Hyperlipidemia, unspecified: Secondary | ICD-10-CM | POA: Insufficient documentation

## 2017-08-17 DIAGNOSIS — Z880 Allergy status to penicillin: Secondary | ICD-10-CM | POA: Insufficient documentation

## 2017-08-17 DIAGNOSIS — Z886 Allergy status to analgesic agent status: Secondary | ICD-10-CM | POA: Insufficient documentation

## 2017-08-17 DIAGNOSIS — Z882 Allergy status to sulfonamides status: Secondary | ICD-10-CM | POA: Insufficient documentation

## 2017-08-17 DIAGNOSIS — Z888 Allergy status to other drugs, medicaments and biological substances status: Secondary | ICD-10-CM | POA: Insufficient documentation

## 2017-08-17 DIAGNOSIS — R079 Chest pain, unspecified: Secondary | ICD-10-CM

## 2017-08-17 DIAGNOSIS — K449 Diaphragmatic hernia without obstruction or gangrene: Secondary | ICD-10-CM | POA: Insufficient documentation

## 2017-08-17 DIAGNOSIS — R42 Dizziness and giddiness: Secondary | ICD-10-CM | POA: Insufficient documentation

## 2017-08-17 DIAGNOSIS — I10 Essential (primary) hypertension: Secondary | ICD-10-CM | POA: Insufficient documentation

## 2017-08-17 DIAGNOSIS — Z79899 Other long term (current) drug therapy: Secondary | ICD-10-CM | POA: Insufficient documentation

## 2017-08-17 DIAGNOSIS — Z9884 Bariatric surgery status: Secondary | ICD-10-CM | POA: Insufficient documentation

## 2017-08-17 DIAGNOSIS — R030 Elevated blood-pressure reading, without diagnosis of hypertension: Secondary | ICD-10-CM

## 2017-08-17 DIAGNOSIS — K219 Gastro-esophageal reflux disease without esophagitis: Secondary | ICD-10-CM | POA: Insufficient documentation

## 2017-08-17 DIAGNOSIS — Z8673 Personal history of transient ischemic attack (TIA), and cerebral infarction without residual deficits: Secondary | ICD-10-CM | POA: Insufficient documentation

## 2017-08-17 DIAGNOSIS — R0789 Other chest pain: Secondary | ICD-10-CM | POA: Insufficient documentation

## 2017-08-17 DIAGNOSIS — M170 Bilateral primary osteoarthritis of knee: Secondary | ICD-10-CM | POA: Insufficient documentation

## 2017-08-17 LAB — CBC AND DIFFERENTIAL
Absolute NRBC: 0 10*3/uL
Basophils Absolute Automated: 0.02 10*3/uL (ref 0.00–0.20)
Basophils Automated: 0.5 %
Eosinophils Absolute Automated: 0.05 10*3/uL (ref 0.00–0.70)
Eosinophils Automated: 1.3 %
Hematocrit: 37.3 % (ref 37.0–47.0)
Hgb: 12.4 g/dL (ref 12.0–16.0)
Immature Granulocytes Absolute: 0 10*3/uL
Immature Granulocytes: 0 %
Lymphocytes Absolute Automated: 1.17 10*3/uL (ref 0.50–4.40)
Lymphocytes Automated: 30 %
MCH: 28.8 pg (ref 28.0–32.0)
MCHC: 33.2 g/dL (ref 32.0–36.0)
MCV: 86.5 fL (ref 80.0–100.0)
MPV: 9.1 fL — ABNORMAL LOW (ref 9.4–12.3)
Monocytes Absolute Automated: 0.42 10*3/uL (ref 0.00–1.20)
Monocytes: 10.8 %
Neutrophils Absolute: 2.24 10*3/uL (ref 1.80–8.10)
Neutrophils: 57.4 %
Nucleated RBC: 0 /100 WBC (ref 0.0–1.0)
Platelets: 250 10*3/uL (ref 140–400)
RBC: 4.31 10*6/uL (ref 4.20–5.40)
RDW: 15 % (ref 12–15)
WBC: 3.9 10*3/uL (ref 3.50–10.80)

## 2017-08-17 LAB — COMPREHENSIVE METABOLIC PANEL
ALT: 11 U/L (ref 0–55)
AST (SGOT): 19 U/L (ref 5–34)
Albumin/Globulin Ratio: 1.4 (ref 0.9–2.2)
Albumin: 3.9 g/dL (ref 3.5–5.0)
Alkaline Phosphatase: 90 U/L (ref 37–106)
Anion Gap: 7 (ref 5.0–15.0)
BUN: 9 mg/dL (ref 7.0–19.0)
Bilirubin, Total: 0.9 mg/dL (ref 0.2–1.2)
CO2: 28 mEq/L (ref 22–29)
Calcium: 9.6 mg/dL (ref 8.5–10.5)
Chloride: 107 mEq/L (ref 100–111)
Creatinine: 0.7 mg/dL (ref 0.6–1.0)
Globulin: 2.7 g/dL (ref 2.0–3.6)
Glucose: 90 mg/dL (ref 70–100)
Potassium: 4.2 mEq/L (ref 3.5–5.1)
Protein, Total: 6.6 g/dL (ref 6.0–8.3)
Sodium: 142 mEq/L (ref 136–145)

## 2017-08-17 LAB — IHS D-DIMER: D-Dimer: 0.63 ug/mL FEU (ref 0.00–0.70)

## 2017-08-17 LAB — TROPONIN I: Troponin I: 0.01 ng/mL (ref 0.00–0.09)

## 2017-08-17 LAB — GFR: EGFR: >60

## 2017-08-17 MED ORDER — ONDANSETRON HCL 4 MG/2ML IJ SOLN
4.0000 mg | Freq: Once | INTRAMUSCULAR | Status: AC
Start: 2017-08-17 — End: 2017-08-17
  Administered 2017-08-17: 12:00:00 4 mg via INTRAVENOUS
  Filled 2017-08-17: qty 2

## 2017-08-17 MED ORDER — HYDROCODONE-ACETAMINOPHEN 5-325 MG PO TABS
1.0000 | ORAL_TABLET | Freq: Four times a day (QID) | ORAL | 0 refills | Status: DC | PRN
Start: 2017-08-17 — End: 2017-10-21

## 2017-08-17 MED ORDER — NITROGLYCERIN 2 % TD OINT
0.5000 [in_us] | TOPICAL_OINTMENT | Freq: Once | TRANSDERMAL | Status: DC
Start: 2017-08-17 — End: 2017-08-17
  Administered 2017-08-17: 12:00:00 0.5 [in_us] via TOPICAL
  Filled 2017-08-17: qty 1

## 2017-08-17 MED ORDER — MORPHINE SULFATE 2 MG/ML IJ/IV SOLN (WRAP)
2.0000 mg | Freq: Once | Status: AC
Start: 2017-08-17 — End: 2017-08-17
  Administered 2017-08-17: 12:00:00 2 mg via INTRAVENOUS
  Filled 2017-08-17: qty 1

## 2017-08-17 MED ORDER — ONDANSETRON 4 MG PO TBDP
4.0000 mg | ORAL_TABLET | Freq: Four times a day (QID) | ORAL | 0 refills | Status: DC | PRN
Start: 2017-08-17 — End: 2018-01-24

## 2017-08-17 MED ORDER — ASPIRIN 81 MG PO CHEW
324.0000 mg | CHEWABLE_TABLET | Freq: Once | ORAL | Status: AC
Start: 2017-08-17 — End: 2017-08-17
  Administered 2017-08-17: 12:00:00 324 mg via ORAL
  Filled 2017-08-17: qty 4

## 2017-08-17 NOTE — EDIE (Signed)
Amber [name]?NOTIFICATION?08/17/2017 11:01?[name], [name]?MRN: [id number]    This patient has registered at the [facility] Emergency Room: [facility] Emergency Department   For more information visit: [email or web address]   Criteria met      5 ED Visits in 12 Months    Security Events  No recent Security Events currently on file    ED Care Guidelines  There are currently no ED Care Guidelines in [place] for this patient. Please check your facility's medical records system.    Care Providers  Avanell Banwart has no care providers on record at this time.   E.D. Visit Count (12 mo.)  Facility Visits   [place] [facility] 3   [place] [facility] 1   [place] [facility]: [facility] 4   Total 8   Note: Visits indicate total known visits.      Recent Emergency Department Visit Summary  Admit Date Facility [facility] Type Major Type Diagnoses or Chief Complaint   Aug 17, 2017 [place] [facility] Room: [facility] [place] [facility]  Emergency      chest pain      Jul 16, 2017 [place] Emergency Room: [facility]. [place] [facility]  Emergency      chest pain      Nonspecific low blood-pressure reading      Chest pain, unspecified      Palpitations      Jul 03, 2017 [place] [facility]: [facility] [place] [facility]  Emergency      possible allergic reaction      Urticaria      Urticaria, unspecified      Jul 02, 2017 [place] [facility]  Emergency  Emergency      chest pain      Chest pain, unspecified      Jun 05, 2017 [place] Emergency [facility]: [facility] [employer] [employer]  Emergency  Emergency      Headache, throbbing pain all over, light headed, nauseated      Dizziness      Headache      Other headache syndrome      Hematemesis          Recent Inpatient Visit Summary  No recorded inpatient visits.         The above information is provided for the sole purpose of patient treatment. Use of this information beyond the terms  of Data Sharing Memorandum of Understanding and License Agreement is prohibited. In certain cases not all visits may be represented. Consult the aforementioned facilities for additional information.   ? 2018 [employer] - [place], [place] - [email or web address]

## 2017-08-17 NOTE — ED Triage Notes (Signed)
Pt c/o chest pain intermittently x 4 days. Pt reports some dizziness with pain. Pain worse with standing, no change with deep breath.

## 2017-08-17 NOTE — Discharge Instructions (Signed)
Chest Pain of Unclear Etiology    You have been seen for chest pain. The cause of your pain is not yet known.    Your doctor has learned about your medical history, examined you, and checked any tests that were done. Still, it is unclear why you are having pain. The doctor thinks there is only a very small chance that your pain is caused by a life-threatening condition. Later, your primary care doctor might do more tests or check you again.    Sometimes chest pain is caused by a dangerous condition, like a heart attack, aorta injury, blood clot in the lung, or collapsed lung. It is unlikely that your pain is caused by a life-threatening condition if: Your chest pain lasts only a few seconds at a time; you are not short of breath, nauseated (sick to your stomach), sweaty, or lightheaded; your pain gets worse when you twist or bend; your pain improves with exercise or hard work.    Chest pain is serious. It is VERY IMPORTANT that you follow up with your regular doctor and seek medical attention immediately here or at the nearest Emergency Department if your symptoms become worse or they change.    YOU SHOULD SEEK MEDICAL ATTENTION IMMEDIATELY, EITHER HERE OR AT THE NEAREST EMERGENCY DEPARTMENT, IF ANY OF THE FOLLOWING OCCURS:   Your pain gets worse.   Your pain makes you short of breath, nauseated, or sweaty.   Your pain gets worse when you walk, go up stairs, or exert yourself.   You feel weak, lightheaded, or faint.   It hurts to breathe.   Your leg swells.   Your symptoms get worse or you have new symptoms or concerns.               Dizziness, Nonspecific    You have been seen for dizziness.     Dizziness can mean different things to different people. Some people use dizziness to mean the feeling of spinning when there is no actual movement. This often causes nausea (feeling sick). The medical term for this is "vertigo." Others people use the word dizzy to mean "feeling lightheaded," like you might  faint. This feeling is usually made better when lying down. For some people, neither of these describes how they are feeling. It can just be a feeling that makes you unsteady. This feeling is common in older people. It can be caused by a number of things. These include poor vision or hearing, foot problems and arthritis. It can also be caused by middle ear or sinus problems. The feeling can come and go.    Dizziness is also caused by more serious things. This includes strokes and heart problems.    It is NEVER normal to have the kind of dizziness you have today together with:   Chest pain.   Problems walking because of problems with balance. Especially if you are falling to one side.   Weakness, numbness or tingling in a part of your body.   Drooping of one side of your face.   Confusion.   Severe headache.   Problems speaking.    If you have these symptoms, it is VERY IMPORTANT to go to the nearest emergency department.    Your tests today were negative (normal). This means we found no life-threatening causes for your dizziness. It is OK for you to go home.    See your primary care doctor for more work-up of your dizziness.     YOU  SHOULD SEEK MEDICAL ATTENTION IMMEDIATELY, EITHER HERE OR AT THE NEAREST EMERGENCY DEPARTMENT, IF ANY OF THE FOLLOWING OCCUR:   You cannot speak clearly (slurring), one side of your face droops or you feel weak in the arms or legs (especially on one side).   You have problems with your balance.   You have problems hearing or there is ringing or a feeling of fullness in your ear.   You lose consciousness ("pass out" or faint).   You have severe headache with dizziness.   You have fever (temperature higher than 100.88F / 38C).   You fall and hit your head.                  Elevated Blood Pressure    During your visit today your blood pressure was higher than normal.    Check your blood pressure several times over the next several days, then follow up with your  regular doctor. If you do not have a doctor, ask the medical staff to refer you to one.    You may need medication for your blood pressure if it stays high. Untreated high blood pressure can cause damage to your heart and kidneys and may lead to a heart attack or stroke. It is VERY IMPORTANT to follow up with your doctor.   Check your blood pressure daily and follow up with your doctor.   A doctor will diagnose high blood pressure only if your blood pressure is high for several days. Many pharmacies have machines that let you check your own blood pressure. You can also check with a fire station to see whether a paramedic will take your blood pressure. Another option is to purchase a blood pressure monitor to use at home. These are available at most pharmacies.     YOU SHOULD SEEK MEDICAL ATTENTION IMMEDIATELY, EITHER HERE OR AT THE NEAREST EMERGENCY DEPARTMENT, IF ANY OF THE FOLLOWING OCCURS:   You have a sudden or severe headache.   You are numb, tingly, or weak on one side of your body, half of your face droops, or you have trouble speaking.   You have chest pain.   You are short of breath.

## 2017-08-17 NOTE — ED Provider Notes (Signed)
Physician/Midlevel provider first contact with patient: 08/17/17 1110         History     Chief Complaint   Patient presents with   . Chest Pain       Chief Complaint: Pain  Onset/Duration: 4 days  Quality/Location: Chest  Severity: Mild-to-moderate  Aggravating Factors: None  Alleviating Factors: None  Associated Symptoms/ Additional Comments: Dizziness    Describes a 4 day history of intermittent and nonexertional anterior chest discomfort feelings.  Additionally, describes intermittent dizziness episodes that are not always associated with the chest pain episodes.  Denies shortness of breath/recent trauma/recent travel/recent illnesses/neurological changes      The history is provided by the patient.            Past Medical History:   Diagnosis Date   . Chest pain 2016     Chest pain after eating x6  months--being followed by GI for GERD   . Constipation    . Difficulty walking     amb with cane secondary to arthritis bil knees   . Genital herpes     no current outbreak (10/03/15)   . GERD (gastroesophageal reflux disease)    . Hiatal hernia    . Hyperlipidemia    . Hypertensive disorder     Well controlled on med. Denies any SOB in last 6 months (10/03/15)   . Morbid obesity with BMI of 40.0-44.9, adult     BMI 40.9   . Nausea without vomiting    . OSA on CPAP 2015    CPAP nightly x 1 yr   . Osteoarthritis of knees, bilateral    . TIA (transient ischemic attack) 2014    TIA 2014-no residual. Patient evaulated for possible TIA 07/12/2015  @ [facility] (dischage summary in epic)-per summary CT of head was normal and patient was d/c'd       Past Surgical History:   Procedure Laterality Date   . APPENDECTOMY  >25 yrs   . EGD  01/2015   . fallopian tube surgery  >25 yrs   . INSERTION, PAIN PUMP (MEDICAL) N/A 10/22/2015    Procedure: INSERTION, PAIN PUMP (MEDICAL);  Surgeon: Nicola Police, DO;  Location: [place] [facility] OR;  Service: General;  Laterality: N/A;   . LAPAROSCOPIC, CHOLECYSTECTOMY, CHOLANGIOGRAM  08/13/2014    . 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 LAPAROSCOPIC, HERNIORRHAPHY, HIATAL N/A 10/22/2015    Procedure: LAPAROSCOPIC, HERNIORRHAPHY, HIATAL;  Surgeon: Jeanell Sparrow, Hamid R, DO;  Location: [place] [facility] OR;  Service: General;  Laterality: N/A;   . OVARY SURGERY Right >25 yrs   . SPINE SURGERY  11/2013    cervical HNP x 2, Dr. Bufford Buttner       Family History   Problem Relation Age of Onset   . Cancer Mother 38        breast cancer       Social  Social History   Substance Use Topics   . Smoking status: Never Smoker   . Smokeless tobacco: Never Used   . Alcohol use No       .     Allergies   Allergen Reactions   . Acyclovir Nausea And Vomiting   . Amoxicillin-Pot Clavulanate      Other reaction(s): gi distress   . Aspirin Nausea And Vomiting  Other reaction(s): gi distress  Upsets stomach  Able to tolerate enteric coated aspirin   . Atorvastatin      Palpitation   . Lovastatin Nausea And Vomiting   . Metronidazole Nausea And Vomiting   . Moxifloxacin Nausea And Vomiting     GI symptoms   . Other Nausea And Vomiting   . Rosuvastatin      Palpitation, chest pain, abd pain    . Statins    . Sulfa Antibiotics Nausea And Vomiting     Other reaction(s): gi distress       Home Medications     Med List Status:  Complete Set By: Ross Marcus, RN at 08/17/2017 11:14 AM                albuterol (PROVENTIL HFA;VENTOLIN HFA) 108 (90 BASE) MCG/ACT inhaler     Inhale into the lungs every 4 (four) hours as needed.        amLODIPine (NORVASC) 5 MG tablet     Take 1 tablet (5 mg total) by mouth daily.     atenolol (TENORMIN) 50 MG tablet     Take 1 tablet (50 mg total) by mouth daily.     B Complex Vitamins (VITAMIN-B COMPLEX) Tab     Take  by Mouth Once a Day.     butalbital-acetaminophen-caffeine (FIORICET, ESGIC) 50-325-40 MG per tablet     Take 1 tablet by mouth every 6 (six)  hours as needed for Pain.     cholecalciferol (VITAMIN D-1000 MAX ST) 1000 units tablet     Take 1,000 Units by Mouth Once a Day.     desonide (DESOWEN) 0.05 % cream     Apply topically 2 (two) times daily as needed.     diazePAM (VALIUM) 2 MG tablet     Take 1 tablet (2 mg total) by mouth every 8 (eight) hours as needed for Anxiety.     diphenhydrAMINE (BENADRYL) 25 MG tablet     Take 1-2 tablets (25-50 mg total) by mouth every 6 (six) hours as needed for Itching.     docusate sodium (COLACE) 100 MG capsule     Take 100 mg by mouth daily.         famotidine (PEPCID) 20 MG tablet     Take 1 tablet (20 mg total) by mouth 2 (two) times daily.     fluticasone (FLONASE) 50 MCG/ACT nasal spray     2 sprays by Nasal route daily.     Patient taking differently:  2 sprays by Nasal route as needed.        methylPREDNISolone (MEDROL DOSPACK) 4 MG tablet     Use as directed     pantoprazole (PROTONIX) 40 MG tablet     TAKE 1 TABLET(40 MG) BY MOUTH DAILY     sucralfate (CARAFATE) 1 GM/10ML suspension     Take 10 mLs (1 g total) by mouth 4 (four) times daily.     valACYclovir HCL (VALTREX) 500 MG tablet     TAKE 1 TABLET(500 MG) BY MOUTH DAILY     vitamin A 16109 UNIT capsule     Take 10,000 Units by Mouth Once a Day.           Review of Systems   Constitutional: Negative for fatigue and fever.   HENT: Negative for congestion and sore throat.    Eyes: Negative for redness and visual disturbance.   Respiratory: Negative for cough and shortness of breath.  Cardiovascular: Positive for chest pain. Negative for palpitations.   Gastrointestinal: Negative for abdominal pain, diarrhea, nausea and vomiting.   Genitourinary: Negative for difficulty urinating and dysuria.   Musculoskeletal: Negative for back pain.   Skin: Negative for rash.   Neurological: Positive for dizziness. Negative for weakness, numbness and headaches.   Psychiatric/Behavioral: Negative for suicidal ideas. The patient is not nervous/anxious.        Physical Exam     BP: 160/77, Heart Rate: 62, Temp: 98 F (36.7 C), Resp Rate: 17, SpO2: 100 %, Weight: 59 kg    Physical Exam   Constitutional: She is oriented to person, place, and time. She appears well-developed. No distress.   Comfortable, well-appearing, no apparent distress, with husband reading book at bedside   HENT:   Head: Normocephalic and atraumatic.   Mouth/Throat: Oropharynx is clear and moist.   Eyes: Pupils are equal, round, and reactive to light. Conjunctivae and EOM are normal. No scleral icterus.   Neck: Normal range of motion. Neck supple. No JVD present.   Cardiovascular: Normal rate and regular rhythm.    No murmur heard.  Pulmonary/Chest: Effort normal and breath sounds normal. No respiratory distress.   Abdominal: Soft. She exhibits no mass. There is no tenderness.   Musculoskeletal: Normal range of motion. She exhibits no edema.        Right shoulder: She exhibits no deformity.   Lymphadenopathy:     She has no cervical adenopathy.   Neurological: She is alert and oriented to person, place, and time. She has normal strength. No cranial nerve deficit or sensory deficit. Coordination normal. GCS eye subscore is 4. GCS verbal subscore is 5. GCS motor subscore is 6.   Skin: Skin is warm. No rash noted.   Psychiatric: She has a normal mood and affect. Her speech is normal and behavior is normal. Cognition and memory are normal.         MDM and ED Course     ED Medication Orders     Start Ordered     Status Ordering Provider    08/17/17 1153 08/17/17 1152  morphine injection 2 mg  Once     Route: Intravenous  Ordered Dose: 2 mg     Last MAR action:  Given Koula Venier JEFFREY    08/17/17 1153 08/17/17 1152  ondansetron (ZOFRAN) injection 4 mg  Once     Route: Intravenous  Ordered Dose: 4 mg     Last MAR action:  Given Toniann Dickerson JEFFREY    08/17/17 1153 08/17/17 1152  aspirin chewable tablet 324 mg  Once     Route: Oral  Ordered Dose: 324 mg     Last MAR action:  Given Apoorva Bugay JEFFREY     08/17/17 1153 08/17/17 1152  nitroglycerin (NITRO-BID) 2 % ointment 0.5 inch  Once     Route: Topical  Ordered Dose: 0.5 inch     Last MAR action:  Ointment Applied Aimie Wagman JEFFREY             MDM  Number of Diagnoses or Management Options  Chest pain, unspecified type: new and requires workup  Dizziness: minor  Elevated blood pressure reading: new and requires workup  Diagnosis management comments: EKG (interpreted by myself): Sinus bradycardia@59 , left axis deviation, normal intervals, nonspecific ST changes    Negative PE/dissection risk factors and negative d-dimer.  No abnormality noted via chest x-ray.  Not consistent with ACS, given 4 day history of nausea exertional  symptoms, combined with negative ED workup and reported negative catheterization several years ago.  Suggest symptomatic care, discussed prompt follow-up PCP/cardiology reevaluation and return if concerns.       Amount and/or Complexity of Data Reviewed  Clinical lab tests: ordered and reviewed  Tests in the radiology section of CPT: ordered  Obtain history from someone other than the patient: yes  Review and summarize past medical records: yes (Per 3/18 Cardiology consult:  "Chest pain.  No evidence of acute coronary syndrome by serial cardiac enzymes and EKG thus far.  She has had prior extensive workups for chest pain including normal cardiac caths x2 most recently in 12/2013.  No ischemic eval indicated at this time."  )  Independent visualization of images, tracings, or specimens: yes    Risk of Complications, Morbidity, and/or Mortality  Presenting problems: high  Diagnostic procedures: high  Management options: high    Patient Progress  Patient progress: stable                   Procedures    Clinical Impression & Disposition     Clinical Impression  Final diagnoses:   None        ED Disposition     None           New Prescriptions    No medications on file                 Herma Ard, MD  08/21/17 [phone]

## 2017-08-18 ENCOUNTER — Telehealth (INDEPENDENT_AMBULATORY_CARE_PROVIDER_SITE_OTHER): Payer: Self-pay

## 2017-08-18 LAB — ECG 12-LEAD
Atrial Rate: 59 {beats}/min
P Axis: 42 degrees
P-R Interval: 176 ms
Q-T Interval: 396 ms
QRS Duration: 88 ms
QTC Calculation (Bezet): 392 ms
R Axis: -4 degrees
T Axis: 22 degrees
Ventricular Rate: 59 {beats}/min

## 2017-08-18 NOTE — Telephone Encounter (Signed)
ED Follow Up CALL:    EDVisit Date: 08/17/17  Primary Discharge Dx: chest pain, dizziness  Secondary Dx: elevated blood pressure reading  Follow up Appt with PCP: 0  Call placed to patient to follow up recent hospital discharge, assess current status, address questions/concerns regarding discharge instructions / medications and encourage patient to schedule Hospital Discharge follow up appt with PCP; No Answer; LVMM with contact information and request for return call.  Will continue to follow.

## 2017-08-30 ENCOUNTER — Ambulatory Visit (INDEPENDENT_AMBULATORY_CARE_PROVIDER_SITE_OTHER): Payer: Commercial Managed Care - HMO | Admitting: Cardiology

## 2017-08-30 ENCOUNTER — Encounter (INDEPENDENT_AMBULATORY_CARE_PROVIDER_SITE_OTHER): Payer: Self-pay | Admitting: Cardiology

## 2017-08-30 VITALS — BP 138/90 | HR 71 | Wt 137.0 lb

## 2017-08-30 DIAGNOSIS — R0789 Other chest pain: Secondary | ICD-10-CM

## 2017-08-30 DIAGNOSIS — R002 Palpitations: Secondary | ICD-10-CM

## 2017-08-30 DIAGNOSIS — Z01818 Encounter for other preprocedural examination: Secondary | ICD-10-CM

## 2017-08-30 DIAGNOSIS — I10 Essential (primary) hypertension: Secondary | ICD-10-CM

## 2017-08-30 DIAGNOSIS — Z01810 Encounter for preprocedural cardiovascular examination: Secondary | ICD-10-CM

## 2017-08-30 NOTE — Progress Notes (Signed)
Amber [name] [phone]. with a history of HTN presents for cardiac evaluation prior to EGD    Pre op evaluation   - no angina or dyspnea with moderate activity   - functional capacity > 4 METS    Cardiac work up has included  - cardiac cath ( 01/08/14 - normal )  - echocardiogram ( EF 55%, mild TR)              - 24 hour holter - no significant events    Palpitations  - daily  - no syncope              - no significant events on 30 day event monitor    Chest pain  - atypical  - normal cardiac cath    HTN  - better controlled at home    Dysphagia  Odynophagia  Dyspepsia  Hx of bariatric surgery      Past Medical History:   Diagnosis Date   . Chest pain 2016     Chest pain after eating x6  months--being followed by GI for GERD   . Constipation    . Difficulty walking     amb with cane secondary to arthritis bil knees   . Genital herpes     no current outbreak (10/03/15)   . GERD (gastroesophageal reflux disease)    . Hiatal hernia    . Hyperlipidemia    . Hypertensive disorder     Well controlled on med. Denies any SOB in last 6 months (10/03/15)   . Morbid obesity with BMI of 40.0-44.9, adult     BMI 40.9   . Nausea without vomiting    . OSA on CPAP 2015    CPAP nightly x 1 yr   . Osteoarthritis of knees, bilateral    . TIA (transient ischemic attack) 2014    TIA 2014-no residual. Patient evaulated for possible TIA 07/12/2015  @ [facility] (dischage summary in epic)-per summary CT of head was normal and patient was d/c'd     Family History   Problem Relation Age of Onset   . Cancer Mother 34        breast cancer     Current Outpatient Prescriptions   Medication Sig Dispense Refill   . albuterol (PROVENTIL HFA;VENTOLIN HFA) 108 (90 BASE) MCG/ACT inhaler Inhale into the lungs every 4 (four) hours as needed.        Marland Kitchen amLODIPine (NORVASC) 5 MG tablet Take 1 tablet (5 mg total) by mouth daily. 30 tablet 5   . atenolol (TENORMIN) 50 MG tablet  Take 1 tablet (50 mg total) by mouth daily. 90 tablet 1   . B Complex Vitamins (VITAMIN-B COMPLEX) Tab Take  by Mouth Once a Day.     . butalbital-acetaminophen-caffeine (FIORICET, ESGIC) 50-325-40 MG per tablet Take 1 tablet by mouth every 6 (six) hours as needed for Pain. 60 tablet 0   . cholecalciferol (VITAMIN D-1000 MAX ST) 1000 units tablet Take 1,000 Units by Mouth Once a Day.     . desonide (DESOWEN) 0.05 % cream Apply topically 2 (two) times daily as needed. 60 g 2   . diazePAM (VALIUM) 2 MG tablet Take 1 tablet (2 mg total) by mouth every 8 (eight) hours as needed for Anxiety. 30 tablet 2   . diphenhydrAMINE (BENADRYL) 25 MG tablet Take 1-2 tablets (25-50 mg total) by mouth every 6 (six) hours as needed for Itching. 25 tablet 0   . docusate sodium (COLACE)  100 MG capsule Take 100 mg by mouth daily.         . famotidine (PEPCID) 20 MG tablet Take 1 tablet (20 mg total) by mouth 2 (two) times daily. 15 tablet 0   . fluticasone (FLONASE) 50 MCG/ACT nasal spray 2 sprays by Nasal route daily. (Patient taking differently: 2 sprays by Nasal route as needed.   ) 16 g 2   . HYDROcodone-acetaminophen (NORCO) 5-325 MG per tablet Take 1 tablet by mouth every 6 (six) hours as needed for Pain.for up to 15 doses 15 tablet 0   . ondansetron (ZOFRAN-ODT) 4 MG disintegrating tablet Take 1 tablet (4 mg total) by mouth every 6 (six) hours as needed for Nausea. 10 tablet 0   . pantoprazole (PROTONIX) 40 MG tablet TAKE 1 TABLET(40 MG) BY MOUTH DAILY 90 tablet 0   . sucralfate (CARAFATE) 1 GM/10ML suspension Take 10 mLs (1 g total) by mouth 4 (four) times daily. 420 mL 0   . valACYclovir HCL (VALTREX) 500 MG tablet TAKE 1 TABLET(500 MG) BY MOUTH DAILY 90 tablet 0   . vitamin A 62952 UNIT capsule Take 10,000 Units by Mouth Once a Day.       No current facility-administered medications for this visit.         PE:    Vitals:    08/30/17 0857   BP: 138/90   Pulse: 71     Body mass index is 25.89 kg/m.    General:  nad  cv rr  Lung  cta  abd s  Ext no edema    EKG:    Nsr, nsstwa    Labs:  Lipid Panel   Cholesterol   Date/Time Value Ref Range Status   04/30/2017 07:19 AM 187 <200 mg/dL Final     Triglycerides   Date/Time Value Ref Range Status   04/30/2017 07:19 AM 55 <150 mg/dL Final   84/13/2440 10:27 AM 63 34 - 149 mg/dL Final     HDL   Date/Time Value Ref Range Status   04/30/2017 07:19 AM 90 >50 mg/dL Final       CMP:   Sodium   Date/Time Value Ref Range Status   08/17/2017 11:20 AM 142 136 - 145 mEq/L Final     Potassium   Date/Time Value Ref Range Status   08/17/2017 11:20 AM 4.2 3.5 - 5.1 mEq/L Final     Chloride   Date/Time Value Ref Range Status   08/17/2017 11:20 AM 107 100 - 111 mEq/L Final   04/30/2017 07:19 AM 106 98 - 110 mmol/L Final     CO2   Date/Time Value Ref Range Status   08/17/2017 11:20 AM 28 22 - 29 mEq/L Final     Glucose   Date/Time Value Ref Range Status   08/17/2017 11:20 AM 90 70 - 100 mg/dL Final     Comment:     ADA guidelines for diabetes mellitus:  Fasting:  Equal to or greater than 126 mg/dL  Random:   Equal to or greater than 200 mg/dL       BUN   Date/Time Value Ref Range Status   08/17/2017 11:20 AM 9.0 7.0 - 19.0 mg/dL Final     Protein, Total   Date/Time Value Ref Range Status   08/17/2017 11:20 AM 6.6 6.0 - 8.3 g/dL Final   25/36/6440 34:74 AM 6.6 6.1 - 8.1 g/dL Final     Alkaline Phosphatase   Date/Time Value Ref Range Status  08/17/2017 11:20 AM 90 37 - 106 U/L Final     AST (SGOT)   Date/Time Value Ref Range Status   08/17/2017 11:20 AM 19 5 - 34 U/L Final     ALT   Date/Time Value Ref Range Status   08/17/2017 11:20 AM 11 0 - 55 U/L Final     Anion Gap   Date/Time Value Ref Range Status   08/17/2017 11:20 AM 7.0 5.0 - 15.0 Final       CBC:   WBC   Date/Time Value Ref Range Status   08/17/2017 11:20 AM 3.90 3.50 - 10.80 x10 3/uL Final   06/30/2009 04:03 PM 9.12 3.50 - 10.80 /CUMM Final     RBC   Date/Time Value Ref Range Status   08/17/2017 11:20 AM 4.31 4.20 - 5.40 x10 6/uL Final     Hemoglobin    Date/Time Value Ref Range Status   04/30/2017 07:19 AM 12.9 11.7 - 15.5 g/dL Final     Hgb   Date/Time Value Ref Range Status   08/17/2017 11:20 AM 12.4 12.0 - 16.0 g/dL Final     Hematocrit   Date/Time Value Ref Range Status   08/17/2017 11:20 AM 37.3 37.0 - 47.0 % Final     MCV   Date/Time Value Ref Range Status   08/17/2017 11:20 AM 86.5 80.0 - 100.0 fL Final     MCHC   Date/Time Value Ref Range Status   08/17/2017 11:20 AM 33.2 32.0 - 36.0 g/dL Final     RDW   Date/Time Value Ref Range Status   08/17/2017 11:20 AM 15 12 - 15 % Final     Platelets   Date/Time Value Ref Range Status   08/17/2017 11:20 AM 250 140 - 400 x10 3/uL Final   04/30/2017 07:19 AM 284 140 - 400 Thousand/uL Final           Impression / plan    Pre op evaluation   - there are no cardiac contraindications to surgery   - ok to proceed with surgery / anesthesia from a cardiac standpoint   - no further cardiac work up warranted prior to surgery  Chest pain   - noncardiac   - likely GI  Palpitations   -  No events on 30 day event monitor   - decrease caffeine intake  HTN   - controlled at home   - continue current medical rx      rtc 6 months      Ivey Cina Dan Humphreys  08/30/2017

## 2017-09-09 ENCOUNTER — Ambulatory Visit (INDEPENDENT_AMBULATORY_CARE_PROVIDER_SITE_OTHER): Payer: Commercial Managed Care - HMO | Admitting: Cardiology

## 2017-09-15 ENCOUNTER — Encounter
Admission: RE | Disposition: A | Discharge status: Home / Self Care | Payer: Self-pay | Source: Ambulatory Visit | Attending: Gastroenterology

## 2017-09-15 ENCOUNTER — Ambulatory Visit: Payer: Self-pay

## 2017-09-15 ENCOUNTER — Ambulatory Visit: Payer: Medicare Other | Admitting: Anesthesiology

## 2017-09-15 ENCOUNTER — Ambulatory Visit
Admission: RE | Admit: 2017-09-15 | Discharge: 2017-09-15 | Disposition: A | Discharge status: Home / Self Care | Payer: Medicare Other | Source: Ambulatory Visit | Attending: Gastroenterology | Admitting: Gastroenterology

## 2017-09-15 DIAGNOSIS — Z8673 Personal history of transient ischemic attack (TIA), and cerebral infarction without residual deficits: Secondary | ICD-10-CM | POA: Insufficient documentation

## 2017-09-15 DIAGNOSIS — I10 Essential (primary) hypertension: Secondary | ICD-10-CM | POA: Insufficient documentation

## 2017-09-15 DIAGNOSIS — E785 Hyperlipidemia, unspecified: Secondary | ICD-10-CM | POA: Insufficient documentation

## 2017-09-15 DIAGNOSIS — K228 Other specified diseases of esophagus: Secondary | ICD-10-CM | POA: Insufficient documentation

## 2017-09-15 DIAGNOSIS — R079 Chest pain, unspecified: Secondary | ICD-10-CM | POA: Insufficient documentation

## 2017-09-15 HISTORY — DX: Unspecified asthma, uncomplicated: J45.909

## 2017-09-15 HISTORY — PX: EGD, BIOPSY: SHX3796

## 2017-09-15 HISTORY — DX: Low back pain, unspecified: M54.50

## 2017-09-15 SURGERY — ESOPHAGOGASTRODUODENOSCOPY (EGD), BIOPSY
Anesthesia: Anesthesia General | Site: Abdomen | Wound class: Clean Contaminated

## 2017-09-15 MED ORDER — PROPOFOL INFUSION 10 MG/ML
INTRAVENOUS | Status: DC | PRN
Start: 2017-09-15 — End: 2017-09-15
  Administered 2017-09-15: 200 ug/kg/min via INTRAVENOUS

## 2017-09-15 MED ORDER — ACETAMINOPHEN 325 MG PO TABS
650.0000 mg | ORAL_TABLET | Freq: Once | ORAL | Status: DC | PRN
Start: 2017-09-15 — End: 2017-09-15

## 2017-09-15 MED ORDER — LACTATED RINGERS IV SOLN
INTRAVENOUS | Status: DC
Start: 2017-09-15 — End: 2017-09-15

## 2017-09-15 MED ORDER — PROPOFOL 10 MG/ML IV EMUL (WRAP)
INTRAVENOUS | Status: AC
Start: 2017-09-15 — End: ?
  Filled 2017-09-15: qty 40

## 2017-09-15 MED ORDER — LIDOCAINE HCL 2 % IJ SOLN
INTRAMUSCULAR | Status: DC | PRN
Start: 2017-09-15 — End: 2017-09-15
  Administered 2017-09-15: 100 mg

## 2017-09-15 MED ORDER — PROMETHAZINE HCL 25 MG/ML IJ SOLN
6.2500 mg | Freq: Once | INTRAMUSCULAR | Status: DC | PRN
Start: 2017-09-15 — End: 2017-09-15

## 2017-09-15 MED ORDER — LIDOCAINE HCL (PF) 2 % IJ SOLN
INTRAMUSCULAR | Status: AC
Start: 2017-09-15 — End: ?
  Filled 2017-09-15: qty 5

## 2017-09-15 MED ORDER — PROPOFOL 10 MG/ML IV EMUL (WRAP)
INTRAVENOUS | Status: DC | PRN
Start: 2017-09-15 — End: 2017-09-15
  Administered 2017-09-15: 50 mg via INTRAVENOUS
  Administered 2017-09-15: 100 mg via INTRAVENOUS

## 2017-09-15 SURGICAL SUPPLY — 22 items
CANISTER 1000CC (Procedure Accessories) ×2 IMPLANT
FORCEPS BIOPSY L240 CM MICROMESH TEETH STREAMLINE CATHETER NEEDLE (Instrument) IMPLANT
FORCEPS BIOPSY L240 CM STANDARD CAPACITY (Instrument) ×1 IMPLANT
FORCEPS RADIAL JAW 4 2.8MM (Instrument) ×1
GAUZE SPONGE 4X4 NS (Dressing) ×10
GOWN ISO YELLOW UNIVERSAL (Gown) ×4 IMPLANT
JELLY LUB EZ LF STRL H2O SOL NGRS TRNLU (Irrigation Solutions) ×1 IMPLANT
JELLY LUBE STRL FLPTOP 4OZ (Irrigation Solutions) ×1
KIT CARRY ON ENDO PROCEDURE (Kits) ×2 IMPLANT
KIT CARRY ON ENDO PROCEDURE CPK4557000001 (Kits) ×1 IMPLANT
KIT UNIVERSAL IRRIGATION SOL (Kits) ×2 IMPLANT
SOFT-CUF 2T ADULT SUB-MIN (Cuff) ×2 IMPLANT
SOL WATER STERILE 1000CC BTLE (Irrigation Solutions) ×1
SPONGE GAUZE L4 IN X W4 IN 16 PLY (Dressing) ×10 IMPLANT
SPONGE GAUZE L4 IN X W4 IN 16 PLY MAXIMUM ABSORBENT USP TYPE VII (Dressing) ×10 IMPLANT
SYRINGE 50 ML GRADUATE NONPYROGENIC DEHP (Syringes, Needles) ×1 IMPLANT
SYRINGE 50 ML GRADUATE NONPYROGENIC DEHP FREE PVC FREE BD MEDICAL (Syringes, Needles) ×1 IMPLANT
SYRINGE SLIP-TIP 60CC (Syringes, Needles) ×1
WATER STERILE PLASTIC POUR BOTTLE 1000 (Irrigation Solutions) ×1 IMPLANT
WATER STERILE PLASTIC POUR BOTTLE 1000 ML (Irrigation Solutions) ×1 IMPLANT
WATER STERILE PLASTIC POUR BOTTLE 250 ML (Irrigation Solutions) ×1 IMPLANT
WATER STRL IRRIG 250ML BTL (Irrigation Solutions) ×1

## 2017-09-15 NOTE — H&P (Signed)
GI PRE PROCEDURE NOTE    Proceduralist Comments:   Review of Systems and Past Medical / Surgical History performed: Yes     Indications:Chest pain    Previous Adverse Reaction to Anesthesia or Sedation (if yes, describe): No    Physical Exam / Laboratory Data (If applicable)   Airway Classification: As per the anesthesiologist    General: Alert and cooperative  Lungs: Lungs clear to auscultation  Cardiac: RRR, normal S1S2.    Abdomen: Soft, non tender. Normal active bowel sounds  Other:     Review of Systems   General:  Denies weight loss, fever chills  Lungs: denies SOB   Cardiac: denies chest pain  Abdomen: denies pain vomiting  Neurological: denies headache weakness    Other:      Scheduled Meds: PRN Meds:         Continuous Infusions:  . lactated ringers 20 mL/hr at 09/15/17 7322      acetaminophen 650 mg Once PRN   promethazine 6.25 mg Once PRN           Home Medications:     Prescriptions Prior to Admission   Medication Sig Dispense Refill Last Dose   . amLODIPine (NORVASC) 5 MG tablet Take 1 tablet (5 mg total) by mouth daily. 30 tablet 5 09/14/2017 at 0900   . atenolol (TENORMIN) 50 MG tablet Take 1 tablet (50 mg total) by mouth daily. 90 tablet 1 09/14/2017 at 0900   . B Complex Vitamins (VITAMIN-B COMPLEX) Tab Take  by Mouth Once a Day.   09/13/2017 at Unknown time   . cholecalciferol (VITAMIN D-1000 MAX ST) 1000 units tablet Take 1,000 Units by Mouth Once a Day.   Past Week at Unknown time   . diazePAM (VALIUM) 2 MG tablet Take 1 tablet (2 mg total) by mouth every 8 (eight) hours as needed for Anxiety. 30 tablet 2 Past Month at Unknown time   . famotidine (PEPCID) 20 MG tablet Take 1 tablet (20 mg total) by mouth 2 (two) times daily. 15 tablet 0 Past Month at Unknown time   . HYDROcodone-acetaminophen (NORCO) 5-325 MG per tablet Take 1 tablet by mouth every 6 (six) hours as needed for Pain.for up to 15 doses 15 tablet 0 Taking   . pantoprazole (PROTONIX) 40 MG tablet TAKE 1 TABLET(40 MG) BY MOUTH DAILY 90  tablet 0 09/14/2017 at 0900   . valACYclovir HCL (VALTREX) 500 MG tablet TAKE 1 TABLET(500 MG) BY MOUTH DAILY 90 tablet 0 09/11/2017   . vitamin A 02542 UNIT capsule Take 10,000 Units by Mouth Once a Day.   Past Week at Unknown time   . albuterol (PROVENTIL HFA;VENTOLIN HFA) 108 (90 BASE) MCG/ACT inhaler Inhale into the lungs every 4 (four) hours as needed.      More than a month at Unknown time   . butalbital-acetaminophen-caffeine (FIORICET, ESGIC) 50-325-40 MG per tablet Take 1 tablet by mouth every 6 (six) hours as needed for Pain. 60 tablet 0 More than a month at Unknown time   . fluticasone (FLONASE) 50 MCG/ACT nasal spray 2 sprays by Nasal route daily. (Patient taking differently: 2 sprays by Nasal route as needed.   ) 16 g 2 More than a month at Unknown time   . ondansetron (ZOFRAN-ODT) 4 MG disintegrating tablet Take 1 tablet (4 mg total) by mouth every 6 (six) hours as needed for Nausea. 10 tablet 0 More than a month at Unknown time  Allergy Drug Reaction:     Allergies   Allergen Reactions   . Acyclovir Nausea And Vomiting   . Amoxicillin-Pot Clavulanate      Other reaction(s): gi distress   . Aspirin Nausea And Vomiting     Other reaction(s): gi distress  Upsets stomach  Able to tolerate enteric coated aspirin   . Atorvastatin      Palpitation   . Lovastatin Nausea And Vomiting   . Metronidazole Nausea And Vomiting   . Moxifloxacin Nausea And Vomiting     GI symptoms   . Other Nausea And Vomiting   . Rosuvastatin      Palpitation, chest pain, abd pain    . Statins    . Sulfa Antibiotics Nausea And Vomiting     Other reaction(s): gi distress       Social History    Non contributory    Family History   Non contributory        Plan of Care:   Proceed with planned endoscopic procedure.    Planned Sedation:   Deep sedation with anesthesia    Attestation:   SADAF PRZYBYSZ has been reassessed immediately prior to the procedure and is an appropriate candidate for the planned sedation and procedure.  Risks, benefits and alternatives to the planned procedure and sedation have been explained to the patient or guardian:  yes        Signed by: Pamelia Hoit.D.

## 2017-09-15 NOTE — Transfer of Care (Signed)
Anesthesia Transfer of Care Note    Patient: Amber [name]    Procedures performed: Procedure(s):  EGD, BIOPSY    Anesthesia type: General TIVA    Patient location:[facility]    Last vitals:   Vitals:    09/15/17 1009   BP: 148/81   Pulse: 74   Resp: 20   Temp: 36.5 C (97.7 F)   SpO2: 100%       Post pain: Patient not complaining of pain, continue current therapy      Mental Status:awake    Respiratory Function: tolerating room air    Cardiovascular: stable    Nausea/Vomiting: patient not complaining of nausea or vomiting    Hydration Status: adequate    Post assessment: no apparent anesthetic complications, no reportable events and no evidence of recall    Signed by: Rogene Houston.  09/15/17 10:09 AM

## 2017-09-15 NOTE — Anesthesia Preprocedure Evaluation (Signed)
Anesthesia Evaluation    AIRWAY    Mallampati: III    TM distance: <3 FB  Neck ROM: limited  Mouth Opening:full   CARDIOVASCULAR    cardiovascular exam normal, regular and normal       DENTAL    no notable dental hx     PULMONARY    pulmonary exam normal and clear to auscultation     OTHER FINDINGS              Relevant Problems   No relevant active problems               Anesthesia Plan    ASA 3     general               (66 yo F with PMH OSA on CPAP, hx TIA, HTN, GERD presents for EGD  Denies recent upper respiratory infection or history of chest pain, dyspnea, easy bleeding or bruising  Denies history of asthma, diabetes, seizures, myocardial infarction, stroke, deep venous thrombosis or pulmonary embolus  METS > 4  Appropriately NPO  Airway appears adequate; has plate and several screws of in her neck, but able to achieve pretty good neck extension  No problems with previous anesthetics    Risks and benefits of GA were discussed with the patient (including but not limited to) damage to lips/teeth/gums, corneal abrasions, neuropathy due to positioning, myocardial infarction, stroke, and allergic reactions to medications given. The patient states understanding and consents to the anesthetic plan.  )      intravenous induction   Detailed anesthesia plan: general IV        Post op pain management: PO analgesics    informed consent obtained    Plan discussed with CRNA.    ECG reviewed    imaging results reviewed           Signed by: Francisco Capuchin 09/15/17 9:10 AM

## 2017-09-15 NOTE — Anesthesia Postprocedure Evaluation (Signed)
Anesthesia Post Evaluation    Patient: Amber [name]    Procedures performed: Procedure(s):  EGD, BIOPSY    Anesthesia type: General TIVA    Patient location:[facility]    Last vitals:   Vitals:    09/15/17 1040   BP: 150/72   Pulse: 62   Resp: 14   Temp:    SpO2: 100%       Post pain: Patient not complaining of pain, continue current therapy      Mental Status:awake and alert     Respiratory Function: tolerating room air    Cardiovascular: stable    Nausea/Vomiting: patient not complaining of nausea or vomiting    Hydration Status: adequate    Post assessment: no apparent anesthetic complications, no reportable events and no evidence of recall    Signed by: Francisco Capuchin, 09/15/2017 12:43 PM

## 2017-09-16 LAB — LAB USE ONLY - HISTORICAL SURGICAL PATHOLOGY

## 2017-09-17 ENCOUNTER — Encounter: Payer: Self-pay | Admitting: Gastroenterology

## 2017-09-29 ENCOUNTER — Encounter (INDEPENDENT_AMBULATORY_CARE_PROVIDER_SITE_OTHER): Payer: Self-pay | Admitting: Cardiology

## 2017-09-30 ENCOUNTER — Other Ambulatory Visit (INDEPENDENT_AMBULATORY_CARE_PROVIDER_SITE_OTHER): Payer: Self-pay | Admitting: Internal Medicine

## 2017-09-30 DIAGNOSIS — A6000 Herpesviral infection of urogenital system, unspecified: Secondary | ICD-10-CM

## 2017-10-07 ENCOUNTER — Other Ambulatory Visit (INDEPENDENT_AMBULATORY_CARE_PROVIDER_SITE_OTHER): Payer: Self-pay | Admitting: Internal Medicine

## 2017-10-07 DIAGNOSIS — R002 Palpitations: Secondary | ICD-10-CM

## 2017-10-07 DIAGNOSIS — I10 Essential (primary) hypertension: Secondary | ICD-10-CM

## 2017-10-10 MED ORDER — ATENOLOL 50 MG PO TABS
50.0000 mg | ORAL_TABLET | Freq: Every day | ORAL | 0 refills | Status: DC
Start: 2017-10-10 — End: 2017-11-09

## 2017-10-13 ENCOUNTER — Other Ambulatory Visit (INDEPENDENT_AMBULATORY_CARE_PROVIDER_SITE_OTHER): Payer: Self-pay | Admitting: Internal Medicine

## 2017-10-13 DIAGNOSIS — I10 Essential (primary) hypertension: Secondary | ICD-10-CM

## 2017-10-21 ENCOUNTER — Ambulatory Visit (INDEPENDENT_AMBULATORY_CARE_PROVIDER_SITE_OTHER): Payer: Medicare Other | Admitting: Cardiology

## 2017-10-21 ENCOUNTER — Encounter (INDEPENDENT_AMBULATORY_CARE_PROVIDER_SITE_OTHER): Payer: Self-pay | Admitting: Cardiology

## 2017-10-21 VITALS — BP 133/84 | HR 86 | Ht 61.81 in | Wt 135.0 lb

## 2017-10-21 DIAGNOSIS — R002 Palpitations: Secondary | ICD-10-CM

## 2017-10-21 DIAGNOSIS — I10 Essential (primary) hypertension: Secondary | ICD-10-CM

## 2017-10-22 ENCOUNTER — Encounter (INDEPENDENT_AMBULATORY_CARE_PROVIDER_SITE_OTHER): Payer: Self-pay | Admitting: Cardiology

## 2017-10-22 NOTE — Progress Notes (Signed)
[name] [phone]. with a history of HTN presents for cardiac follow up      Cardiac work up has included  - cardiac cath ( 01/08/14 - normal )  - echocardiogram ( EF 55%, mild TR)  - 24 hour holter - no significant events    She has had symptomatic hypotension on current blood pressure regimen ( norvasc 5 mg and atenolol 50 mg )    Palpitations  - daily  - no syncope  - no significant events on 30 day event monitor    Chest pain  - atypical  - normal cardiac cath        Dysphagia  Odynophagia  Dyspepsia  Hx of bariatric surgery        Past Medical History:   Diagnosis Date   . Asthma    . Chest pain 2016     Chest pain after eating x6  months--being followed by GI for GERD   . Constipation    . Difficulty walking     amb with cane secondary to arthritis bil knees   . Genital herpes     no current outbreak (10/03/15)   . GERD (gastroesophageal reflux disease)    . Hiatal hernia     h/o surgery   . Hyperlipidemia    . Hypertensive disorder     Well controlled on med. Denies any SOB in last 6 months (10/03/15)   . Low back pain    . Morbid obesity with BMI of 40.0-44.9, adult     BMI 40.9   . Nausea without vomiting    . OSA on CPAP 2015    CPAP nightly x 1 yr   . Osteoarthritis of knees, bilateral     and back   . TIA (transient ischemic attack) 2014    TIA 2014-no residual. Patient evaulated for possible TIA 07/12/2015  @ [facility] (dischage summary in epic)-per summary CT of head was normal and patient was d/c'd     Family History   Problem Relation Age of Onset   . Cancer Mother 6        breast cancer   . Breast cancer Mother    . Heart disease Father    . Malignant hyperthermia Neg Hx    . Pseudochol deficiency Neg Hx      Current Outpatient Prescriptions   Medication Sig Dispense Refill   . albuterol (PROVENTIL HFA;VENTOLIN HFA) 108 (90 BASE) MCG/ACT inhaler Inhale into the lungs every 4 (four) hours as needed.         Marland Kitchen atenolol (TENORMIN) 50 MG tablet Take 1 tablet (50 mg total) by mouth daily. 90 tablet 0   . B Complex Vitamins (VITAMIN-B COMPLEX) Tab Take  by Mouth Once a Day.     . cholecalciferol (VITAMIN D-1000 MAX ST) 1000 units tablet Take 1,000 Units by Mouth Once a Day.     . diazePAM (VALIUM) 2 MG tablet Take 1 tablet (2 mg total) by mouth every 8 (eight) hours as needed for Anxiety. 30 tablet 2   . famotidine (PEPCID) 20 MG tablet Take 1 tablet (20 mg total) by mouth 2 (two) times daily. 15 tablet 0   . fluticasone (FLONASE) 50 MCG/ACT nasal spray 2 sprays by Nasal route daily. (Patient taking differently: 2 sprays by Nasal route as needed.   ) 16 g 2   . ondansetron (ZOFRAN-ODT) 4 MG disintegrating tablet Take 1 tablet (4 mg total) by mouth every 6 (six) hours as  needed for Nausea. 10 tablet 0   . pantoprazole (PROTONIX) 40 MG tablet TAKE 1 TABLET(40 MG) BY MOUTH DAILY 90 tablet 0   . valACYclovir HCL (VALTREX) 500 MG tablet TAKE 1 TABLET(500 MG) BY MOUTH DAILY 90 tablet 0   . vitamin A 16109 UNIT capsule Take 10,000 Units by Mouth Once a Day.       No current facility-administered medications for this visit.             PE:    Vitals:    10/21/17 1047   BP: 133/84   Pulse: 86     Body mass index is 24.84 kg/m.    General:  nad  cv rr  Lung cta  abd s  Ext no edema    EKG:    nsr    Labs:  Lipid Panel   Cholesterol   Date/Time Value Ref Range Status   04/30/2017 07:19 AM 187 <200 mg/dL Final     Triglycerides   Date/Time Value Ref Range Status   04/30/2017 07:19 AM 55 <150 mg/dL Final   60/45/4098 11:91 AM 63 34 - 149 mg/dL Final     HDL   Date/Time Value Ref Range Status   04/30/2017 07:19 AM 90 >50 mg/dL Final       CMP:   Sodium   Date/Time Value Ref Range Status   08/17/2017 11:20 AM 142 136 - 145 mEq/L Final     Potassium   Date/Time Value Ref Range Status   08/17/2017 11:20 AM 4.2 3.5 - 5.1 mEq/L Final     Chloride   Date/Time Value Ref Range Status   08/17/2017 11:20 AM 107 100 - 111 mEq/L Final    04/30/2017 07:19 AM 106 98 - 110 mmol/L Final     CO2   Date/Time Value Ref Range Status   08/17/2017 11:20 AM 28 22 - 29 mEq/L Final     Glucose   Date/Time Value Ref Range Status   08/17/2017 11:20 AM 90 70 - 100 mg/dL Final     Comment:     ADA guidelines for diabetes mellitus:  Fasting:  Equal to or greater than 126 mg/dL  Random:   Equal to or greater than 200 mg/dL       BUN   Date/Time Value Ref Range Status   08/17/2017 11:20 AM 9.0 7.0 - 19.0 mg/dL Final     Protein, Total   Date/Time Value Ref Range Status   08/17/2017 11:20 AM 6.6 6.0 - 8.3 g/dL Final   47/82/9562 13:08 AM 6.6 6.1 - 8.1 g/dL Final     Alkaline Phosphatase   Date/Time Value Ref Range Status   08/17/2017 11:20 AM 90 37 - 106 U/L Final     AST (SGOT)   Date/Time Value Ref Range Status   08/17/2017 11:20 AM 19 5 - 34 U/L Final     ALT   Date/Time Value Ref Range Status   08/17/2017 11:20 AM 11 0 - 55 U/L Final     Anion Gap   Date/Time Value Ref Range Status   08/17/2017 11:20 AM 7.0 5.0 - 15.0 Final       CBC:   WBC   Date/Time Value Ref Range Status   08/17/2017 11:20 AM 3.90 3.50 - 10.80 x10 3/uL Final   06/30/2009 04:03 PM 9.12 3.50 - 10.80 /CUMM Final     RBC   Date/Time Value Ref Range Status   08/17/2017 11:20 AM 4.31 4.20 - 5.40  x10 6/uL Final     Hemoglobin   Date/Time Value Ref Range Status   04/30/2017 07:19 AM 12.9 11.7 - 15.5 g/dL Final     Hgb   Date/Time Value Ref Range Status   08/17/2017 11:20 AM 12.4 12.0 - 16.0 g/dL Final     Hematocrit   Date/Time Value Ref Range Status   08/17/2017 11:20 AM 37.3 37.0 - 47.0 % Final     MCV   Date/Time Value Ref Range Status   08/17/2017 11:20 AM 86.5 80.0 - 100.0 fL Final     MCHC   Date/Time Value Ref Range Status   08/17/2017 11:20 AM 33.2 32.0 - 36.0 g/dL Final     RDW   Date/Time Value Ref Range Status   08/17/2017 11:20 AM 15 12 - 15 % Final     Platelets   Date/Time Value Ref Range Status   08/17/2017 11:20 AM 250 140 - 400 x10 3/uL Final   04/30/2017 07:19 AM 284 140 - 400  Thousand/uL Final           Impression / plan    HTN   - episodic symptomatic hypotension. Will stop norvasc. Continue atenolol  Palpitations   - no significant event on event monitor; continue atenolol  Dyspepsia   - GI fu       Roshawna Colclasure Dan Humphreys  10/22/2017

## 2017-10-27 ENCOUNTER — Encounter (INDEPENDENT_AMBULATORY_CARE_PROVIDER_SITE_OTHER): Payer: Self-pay

## 2017-10-27 NOTE — Progress Notes (Signed)
Pt called saying that at her last office visit with Dr. Katrinka Blazing, the Norvasc was stopped.  She was to keep record of her BP readings since stopping the Norvasc.  Her BP is running 144/97 - 153/94.  Per Dr. Celene Skeen, pt to start Norvasc at 2.5mg /day and keep record of BP.  Report any hypotensive episodes.

## 2017-10-29 ENCOUNTER — Encounter (INDEPENDENT_AMBULATORY_CARE_PROVIDER_SITE_OTHER): Payer: Self-pay

## 2017-10-29 NOTE — Progress Notes (Signed)
Pt called saying that yesterday and today she has had low BP readings with dizziness.  84-91/50-70.  Per Dr. Franchot Erichsen, stop Norvasc, keep record of BP and take to f/u appt with Dr. Katrinka Blazing on 11/09/17.

## 2017-11-04 ENCOUNTER — Ambulatory Visit (INDEPENDENT_AMBULATORY_CARE_PROVIDER_SITE_OTHER): Payer: Medicare Other

## 2017-11-04 ENCOUNTER — Emergency Department
Admission: EM | Admit: 2017-11-04 | Discharge: 2017-11-04 | Disposition: A | Discharge status: Home / Self Care | Payer: Medicare Other | Attending: Emergency Medicine | Admitting: Emergency Medicine

## 2017-11-04 ENCOUNTER — Emergency Department: Payer: Medicare Other

## 2017-11-04 ENCOUNTER — Other Ambulatory Visit (INDEPENDENT_AMBULATORY_CARE_PROVIDER_SITE_OTHER): Payer: Self-pay | Admitting: Internal Medicine

## 2017-11-04 DIAGNOSIS — K219 Gastro-esophageal reflux disease without esophagitis: Secondary | ICD-10-CM | POA: Insufficient documentation

## 2017-11-04 DIAGNOSIS — R5383 Other fatigue: Secondary | ICD-10-CM | POA: Insufficient documentation

## 2017-11-04 DIAGNOSIS — I10 Essential (primary) hypertension: Secondary | ICD-10-CM

## 2017-11-04 DIAGNOSIS — Z9884 Bariatric surgery status: Secondary | ICD-10-CM | POA: Insufficient documentation

## 2017-11-04 DIAGNOSIS — K911 Postgastric surgery syndromes: Secondary | ICD-10-CM | POA: Insufficient documentation

## 2017-11-04 DIAGNOSIS — J45909 Unspecified asthma, uncomplicated: Secondary | ICD-10-CM | POA: Insufficient documentation

## 2017-11-04 DIAGNOSIS — Z8673 Personal history of transient ischemic attack (TIA), and cerebral infarction without residual deficits: Secondary | ICD-10-CM | POA: Insufficient documentation

## 2017-11-04 DIAGNOSIS — Z79899 Other long term (current) drug therapy: Secondary | ICD-10-CM | POA: Insufficient documentation

## 2017-11-04 LAB — CBC AND DIFFERENTIAL
Absolute NRBC: 0 10*3/uL
Basophils Absolute Automated: 0.03 10*3/uL (ref 0.00–0.20)
Basophils Automated: 0.8 %
Eosinophils Absolute Automated: 0.06 10*3/uL (ref 0.00–0.70)
Eosinophils Automated: 1.5 %
Hematocrit: 39.1 % (ref 37.0–47.0)
Hgb: 12.6 g/dL (ref 12.0–16.0)
Immature Granulocytes Absolute: 0 10*3/uL
Immature Granulocytes: 0 %
Lymphocytes Absolute Automated: 1.15 10*3/uL (ref 0.50–4.40)
Lymphocytes Automated: 28.8 %
MCH: 28.1 pg (ref 28.0–32.0)
MCHC: 32.2 g/dL (ref 32.0–36.0)
MCV: 87.3 fL (ref 80.0–100.0)
MPV: 10.4 fL (ref 9.4–12.3)
Monocytes Absolute Automated: 0.33 10*3/uL (ref 0.00–1.20)
Monocytes: 8.3 %
Neutrophils Absolute: 2.43 10*3/uL (ref 1.80–8.10)
Neutrophils: 60.6 %
Nucleated RBC: 0 /100 WBC (ref 0.0–1.0)
Platelets: 230 10*3/uL (ref 140–400)
RBC: 4.48 10*6/uL (ref 4.20–5.40)
RDW: 15 % (ref 12–15)
WBC: 4 10*3/uL (ref 3.50–10.80)

## 2017-11-04 LAB — COMPREHENSIVE METABOLIC PANEL
ALT: 9 U/L (ref 0–55)
AST (SGOT): 30 U/L (ref 5–34)
Albumin/Globulin Ratio: 1.3 (ref 0.9–2.2)
Albumin: 4 g/dL (ref 3.5–5.0)
Alkaline Phosphatase: 93 U/L (ref 37–106)
Anion Gap: 14 (ref 5.0–15.0)
BUN: 10 mg/dL (ref 7.0–19.0)
Bilirubin, Total: 0.4 mg/dL (ref 0.2–1.2)
CO2: 23 mEq/L (ref 22–29)
Calcium: 9.3 mg/dL (ref 8.5–10.5)
Chloride: 108 mEq/L (ref 100–111)
Creatinine: 0.7 mg/dL (ref 0.6–1.0)
Globulin: 3.1 g/dL (ref 2.0–3.6)
Glucose: 75 mg/dL (ref 70–100)
Potassium: 4.7 mEq/L (ref 3.5–5.1)
Protein, Total: 7.1 g/dL (ref 6.0–8.3)
Sodium: 145 mEq/L (ref 136–145)

## 2017-11-04 LAB — TROPONIN I: Troponin I: <0.01 ng/mL (ref 0.00–0.09)

## 2017-11-04 LAB — GFR: EGFR: >60

## 2017-11-04 MED ORDER — ONDANSETRON HCL 4 MG/2ML IJ SOLN
4.00 mg | Freq: Once | INTRAMUSCULAR | Status: AC
Start: 2017-11-04 — End: 2017-11-04

## 2017-11-04 MED ORDER — ONDANSETRON HCL 4 MG/2ML IJ SOLN
INTRAMUSCULAR | Status: AC
Start: 2017-11-04 — End: 2017-11-04
  Administered 2017-11-04: 11:00:00 4 mg via INTRAVENOUS
  Filled 2017-11-04: qty 2

## 2017-11-04 NOTE — Discharge Instructions (Signed)
Weakness / Fatigue    You have been seen today for generalized weakness. This may also be described as fatigue.    Weakness is a common problem, especially in older individuals.    It is important to understand the difference between true weakness (real weakness from a nerve or brain problem) and the more common problem of fatigue. These words might seem similar but they do mean very different problems.   Fatigue: When a person is describing fatigue, they may feel tired out very quickly even with just a little activity. They may also say they are feeling tired, sleepy, easily exhausted and unable to do normal daily activities because they don't seem to have enough energy.   True Weakness: When someone has true weakness, it means that the muscles are not working right. For example, a leg might be truly weak if you can't support your weight on it or if you can't get up from a chair because the thigh muscles aren't strong enough.    There are many causes of weakness including; Infections (often kidney/bladder infections or pneumonias), electrolyte abnormalities (low sodium, low potassium), depression, and neurologic (brain or nerve) disorders.    After looking at the results of the blood tests or X-rays, the cause of your weakness is:   Unclear or unknown.    It is VERY IMPORTANT to see your primary care doctor. More testing may be needed to figure out the cause of your weakness.    YOU SHOULD SEEK MEDICAL ATTENTION IMMEDIATELY, EITHER HERE OR AT THE NEAREST EMERGENCY DEPARTMENT, IF ANY OF THE FOLLOWING OCCURS:   Confusion, coma, agitation (becoming anxious or irritable).   Fever (temperature higher than 100.6F / 38C), vomiting.   Severe headache.   Signs of stroke (paralysis or numbness on one side of the body, drooping on one side of the face, difficulty talking).   Worsening weakness, difficulty standing, paralysis, loss of control of the bladder or bowels or difficulty swallowing.                Hypertension    You have been diagnosed with elevated blood pressure.    The medical term for high blood pressure is hypertension. Many people feel anxious or uncomfortable about being at the hospital. If you feel anxious today, this could make your blood pressure appear high, even if your blood pressure is usually normal. Check your blood pressure several more times when you are not feeling stress. Keep a record of these readings and give this information to your regular doctor. He or she will decide whether you have hypertension that requires medical treatment.    If your blood pressure becomes extremely high all of a sudden, you will probably notice symptoms. In fact, very high blood pressure is a medical emergency. Most people with hypertension have blood pressure that is only a little too high. Mild high blood pressure does not cause specific symptoms. Instead, the effects of hypertension develop slowly over time. Untreated hypertension can affect the heart, brain, kidneys, eyes, and blood vessels. Unfortunately, by the time side-effects become noticeable, the body has already been damaged. This is why hypertension is called "the silent killer!"    It is important to follow up with your regular doctor. Check your blood pressure several times in the next 1 to 2 weeks and tell your doctor about the results. It may be helpful to keep a log or a journal where you can write down your blood pressures. Note the time of  day and the activity you were doing when the reading was taken.    YOU SHOULD SEEK MEDICAL ATTENTION IMMEDIATELY, EITHER HERE OR AT THE NEAREST EMERGENCY DEPARTMENT, IF ANY OF THE FOLLOWING OCCURS:   You have a headache.   You have chest pain.   You are short of breath or have trouble breathing.    You feel weak, especially on only one side of the body.   Your symptoms get worse or you have other concerns.

## 2017-11-04 NOTE — EDIE (Signed)
[name]?NOTIFICATION?11/04/2017 09:10?Amber [name], Amber [name]?MRN: [id number]    This patient has registered at the [facility] Emergency Room: [facility] Emergency Department   For more information visit: [email or web address]   Criteria met      5 + ED Visits in 12 Months    Security Events  No recent Security Events currently on file    ED Care Guidelines  There are currently no ED Care Guidelines in [name] for this patient. Please check your facility's medical records system.    Care Providers  [name] has no care providers on record at this time.   E.D. Visit Count (12 mo.)  Facility Visits   [place] [facility]: [facility] [place] 1   [place] [facility] 1   [place] [facility] 1   [place] [facility]: [facility] 4   Total 7   Note: Visits indicate total known visits.      Recent Emergency Department Visit Summary  Admit Date Facility [facility] Type Major Type Diagnoses or Chief Complaint   Nov 04, 2017 [facility] Room: [facility] [place] [name] [facility]  Emergency      Hypertention,dizziness      Aug 17, 2017 [place] [facility]: [facility]. [place] [facility]  Emergency      chest pain      Dizziness and giddiness      Chest pain, unspecified      Elevated blood-pressure reading, without diagnosis of hypertension          Recent Inpatient Visit Summary  No recorded inpatient visits.         The above information is provided for the sole purpose of patient treatment. Use of this information beyond the terms of Data Sharing Memorandum of Understanding and License Agreement is prohibited. In certain cases not all visits may be represented. Consult the aforementioned facilities for additional information.   ? 2019 [employer] - [place], [place] - [email or web address]

## 2017-11-04 NOTE — ED Provider Notes (Signed)
EMERGENCY DEPARTMENT HISTORY AND PHYSICAL EXAM     Physician/Midlevel provider first contact with patient: 11/04/17 1610         Date: 11/04/2017  Patient Name: Amber [name]    History of Presenting Illness     Chief Complaint   Patient presents with   . Chest Pain   . Dizziness       History Provided By: Patient    Chief Complaint: Chest pain  Duration: yesterday  Timing:  Constant  Location: mid-chest  Quality: tight  Severity: Moderate  Exacerbating factors: None reported  Alleviating factors: None reported   Associated Symptoms: room spinning dizziness, nausea, vomiting, and diarrhea  Pertinent Negatives: None reported     Additional History: Amber [name] is a 67 y.o. female, with PMHx of HTN,  presenting to the ED with CP since yesterday. She describes the pain as tight. She also complains of room spinning dizziness, nausea, vomiting, and diarrhea. She reports PSHx of bariatric surgery 2 years ago. 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     PCP: Oneita Jolly, MD  SPECIALISTS: Dr. Kalman Shan (Cardiologist)     No current facility-administered medications for this encounter.      Current Outpatient Prescriptions   Medication Sig Dispense Refill   . albuterol (PROVENTIL HFA;VENTOLIN HFA) 108 (90 BASE) MCG/ACT inhaler Inhale into the lungs every 4 (four) hours as needed.        Marland Kitchen atenolol (TENORMIN) 50 MG tablet Take 1 tablet (50 mg total) by mouth daily. 90 tablet 0   . B Complex Vitamins (VITAMIN-B COMPLEX) Tab Take  by Mouth Once a Day.     . cholecalciferol (VITAMIN D-1000 MAX ST) 1000 units tablet Take 1,000 Units by Mouth Once a Day.     . diazePAM (VALIUM) 2 MG tablet Take 1 tablet (2 mg total) by mouth every 8 (eight) hours as needed for Anxiety. 30 tablet 2   . famotidine (PEPCID) 20 MG tablet Take 1 tablet (20 mg total) by mouth 2 (two) times daily. 15 tablet 0   . fluticasone (FLONASE) 50 MCG/ACT nasal spray 2 sprays by Nasal route daily. (Patient taking differently: 2 sprays by Nasal route  as needed.   ) 16 g 2   . ondansetron (ZOFRAN-ODT) 4 MG disintegrating tablet Take 1 tablet (4 mg total) by mouth every 6 (six) hours as needed for Nausea. 10 tablet 0   . pantoprazole (PROTONIX) 40 MG tablet TAKE 1 TABLET(40 MG) BY MOUTH DAILY 90 tablet 0   . valACYclovir HCL (VALTREX) 500 MG tablet TAKE 1 TABLET(500 MG) BY MOUTH DAILY 90 tablet 0   . vitamin A 96045 UNIT capsule Take 10,000 Units by Mouth Once a Day.         Past History     Past Medical History:  Past Medical History:   Diagnosis Date   . Asthma    . Chest pain 2016     Chest pain after eating x6  months--being followed by GI for GERD   . Constipation    . Difficulty walking     amb with cane secondary to arthritis bil knees   . Genital herpes     no current outbreak (10/03/15)   . GERD (gastroesophageal reflux disease)    . Hiatal hernia     h/o surgery   . Hyperlipidemia    . Hypertensive disorder     Well controlled on med. Denies any SOB in last 6 months (10/03/15)   .  Low back pain    . Morbid obesity with BMI of 40.0-44.9, adult     BMI 40.9   . Nausea without vomiting    . OSA on CPAP 2015    CPAP nightly x 1 yr   . Osteoarthritis of knees, bilateral     and back   . TIA (transient ischemic attack) 2014    TIA 2014-no residual. Patient evaulated for possible TIA 07/12/2015  @ [facility] (dischage summary in epic)-per summary CT of head was normal and patient was d/c'd       Past Surgical History:  Past Surgical History:   Procedure Laterality Date   . APPENDECTOMY  >25 yrs   . CHOLECYSTECTOMY     . EGD  01/2015   . EGD, BIOPSY N/A 09/15/2017    Procedure: EGD, BIOPSY;  Surgeon: Pershing Proud, MD;  Location: [facility];  Service: Gastroenterology;  Laterality: N/A;   . fallopian tube surgery  >25 yrs   . INSERTION, PAIN PUMP (MEDICAL) N/A 10/22/2015    Procedure: INSERTION, PAIN PUMP (MEDICAL);  Surgeon: Nicola Police, DO;  Location: [place] [facility];  Service: General;  Laterality: N/A;   . LAPAROSCOPIC, CHOLECYSTECTOMY,  CHOLANGIOGRAM  08/13/2014   . LAPAROSCOPIC, GASTRIC BYPASS N/A 10/22/2015    Procedure: LAPAROSCOPIC, GASTRIC BYPASS;  Surgeon: Josefa Half R, DO;  Location: [place] [facility];  Service: General;  Laterality: N/A;   . LAPAROSCOPIC, HERNIORRHAPHY, HIATAL N/A 10/22/2015    Procedure: LAPAROSCOPIC, HERNIORRHAPHY, HIATAL;  Surgeon: Jeanell Sparrow, Hamid R, DO;  Location: [facility];  Service: General;  Laterality: N/A;   . OVARY SURGERY Right >25 yrs   . SPINE SURGERY  11/2013    cervical HNP x 2, Dr. Bufford Buttner       Family History:  Family History   Problem Relation Age of Onset   . Cancer Mother 84        breast cancer   . Breast cancer Mother    . Heart disease Father    . Malignant hyperthermia Neg Hx    . Pseudochol deficiency Neg Hx        Social History:  Social History   Substance Use Topics   . Smoking status: Never Smoker   . Smokeless tobacco: Never Used   . Alcohol use No       Allergies:  Allergies   Allergen Reactions   . Acyclovir Nausea And Vomiting   . Amoxicillin-Pot Clavulanate      Other reaction(s): gi distress   . Aspirin Nausea And Vomiting     Other reaction(s): gi distress  Upsets stomach  Able to tolerate enteric coated aspirin   . Atorvastatin      Palpitation   . Lovastatin Nausea And Vomiting   . Metronidazole Nausea And Vomiting   . Moxifloxacin Nausea And Vomiting     GI symptoms   . Other Nausea And Vomiting   . Rosuvastatin      Palpitation, chest pain, abd pain    . Statins    . Sulfa Antibiotics Nausea And Vomiting     Other reaction(s): gi distress       Review of Systems     Review of Systems   Constitutional: Negative for fever.   HENT: Negative for congestion.    Eyes: Negative for discharge.   Respiratory: Positive for chest tightness.    Cardiovascular: Positive for chest pain.   Gastrointestinal: Positive for diarrhea, nausea and vomiting.  Musculoskeletal: Negative for myalgias.   Skin: Negative for rash.   Neurological: Positive for dizziness.   Psychiatric/Behavioral:  Negative for confusion.     Physical Exam   BP 177/74   Pulse (!) 48   Temp 97.1 F (36.2 C)   Resp 14   Ht 5\' 1"  (1.549 m)   Wt 61.2 kg   SpO2 100%   BMI 25.51 kg/m     Physical Exam   Constitutional: Patient is oriented to person, place, and time and well-developed, well-nourished thin adult female, very anxious appearing and in no distress.   Head: Normocephalic and atraumatic.   Eyes: EOM are normal. Pupils are equal, round, and reactive to light.   ENT: nares: nl mucosa, no discharge, ears: nl external canals, TMs, NLRB, Pharynx: tonsils nl, uvula midline, no lesions  Neck: Normal range of motion. Neck supple. No anterior cervical LNs.  Cardiovascular: Normal rate and regular rhythm.   Pulmonary/Chest: Effort normal and breath sounds normal. No respiratory distress.   Abdominal: Soft. There is no tenderness. Bowel sounds present and normal.No CVA tenderness  Musculoskeletal: Normal range of motion.   Neurological: Patient is alert and oriented to person, place, and time. GCS score is 15. No sensory deficits, No motor weakness, no facial weakness, no dysarthria, No upper extremity drift, DTRs 2+,   Skin: Skin is warm and dry.     Diagnostic Study Results     Labs -     Results     Procedure Component Value Units Date/Time    Troponin I [[id number]] Collected:  11/04/17 1002    Specimen:  Blood Updated:  11/04/17 1029     Troponin I <0.01 ng/mL     Comprehensive metabolic panel [[id number]] Collected:  11/04/17 1002    Specimen:  Blood Updated:  11/04/17 1026     Glucose 75 mg/dL      BUN 78.2 mg/dL      Creatinine 0.7 mg/dL      Sodium 956 mEq/L      Potassium 4.7 mEq/L      Chloride 108 mEq/L      CO2 23 mEq/L      Calcium 9.3 mg/dL      Protein, Total 7.1 g/dL      Albumin 4.0 g/dL      AST (SGOT) 30 U/L      ALT 9 U/L      Alkaline Phosphatase 93 U/L      Bilirubin, Total 0.4 mg/dL      Globulin 3.1 g/dL      Albumin/Globulin Ratio 1.3     Anion Gap 14.0    GFR [[id number]] Collected:  11/04/17 1002      Updated:  11/04/17 1026     EGFR >60.0    CBC and differential [[id number]] Collected:  11/04/17 1002    Specimen:  Blood from Blood Updated:  11/04/17 1008     WBC 4.00 x10 3/uL      Hgb 12.6 g/dL      Hematocrit 41.3 %      Platelets 230 x10 3/uL      RBC 4.48 x10 6/uL      MCV 87.3 fL      MCH 28.1 pg      MCHC 32.2 g/dL      RDW 15 %      MPV 10.4 fL      Neutrophils 60.6 %      Lymphocytes Automated 28.8 %  Monocytes 8.3 %      Eosinophils Automated 1.5 %      Basophils Automated 0.8 %      Immature Granulocyte 0.0 %      Nucleated RBC 0.0 /100 WBC      Neutrophils Absolute 2.43 x10 3/uL      Abs Lymph Automated 1.15 x10 3/uL      Abs Mono Automated 0.33 x10 3/uL      Abs Eos Automated 0.06 x10 3/uL      Absolute Baso Automated 0.03 x10 3/uL      Absolute Immature Granulocyte 0.00 x10 3/uL      Absolute NRBC 0.00 x10 3/uL           Radiologic Studies -   Radiology Results (24 Hour)     Procedure Component Value Units Date/Time    Chest AP Portable [[id number]] Collected:  11/04/17 1028    Order Status:  Completed Updated:  11/04/17 1033    Narrative:       History: Chest pain.    COMPARISON: Chest x-ray dated 08/17/2017.    INTERPRETATION:  The heart and aorta are normal in size and  configuration for the patient's age.  The pulmonary vasculature is  normal in caliber and distribution.  The lungs are adequately inflated  and free of infiltrate or mass.  No pleural abnormalities are present.   The visualized osseous structures are intact. Stable plate and screw  fixation of the lower cervical spine.      Impression:         Normal chest.    Miki Kins, MD   11/04/2017 10:29 AM      .    Medical Decision Making   I am the first provider for this patient.    I reviewed the vital signs, available nursing notes, past medical history, past surgical history, family history and social history.    Vital Signs-Reviewed the patient's vital signs.     Patient Vitals for the past 12 hrs:   BP Temp Pulse Resp   11/04/17  1057 - - (!) 48 14   11/04/17 1042 177/74 - (!) 48 15   11/04/17 0923 178/76 97.1 F (36.2 C) (!) 53 18       Pulse Oximetry Analysis - Normal 100% on RA    Cardiac Monitor:  Rate: 52 bpm  Rhythm:  Sinus Bradycardia     EKG:  Interpreted by the EP.   Time Interpreted: 0917   Rate: 52 bpm   Rhythm: Sinus Bradycardia   Interpretation: moderate voltage criteria for LVH, no STEMI   Comparison: compared to 08/17/2017 unchanged    Old Medical Records:   Old medical records: She has extensive cardiac workup in October 2018. Last saw Dr. Glean Hess on 10/21/17.  Previous electrocardiograms.  Nursing notes.     ED Course:     9:56 AM - Awaiting lab and imaging results.     10:59 AM - Updated pt on lab and imaging results. Counseled on diagnosis. Pt instructed to follow up with her cardiologist Dr. Katrinka Blazing, and her PCP Dr. Daivd Council. Told to return for worsening symptoms or any concerns. She is stable. Discharged home.     Provider Notes:    Diagnosis     Clinical Impression:   1. Fatigue, unspecified type    2. Dumping syndrome    3. H/O bariatric surgery    4. Essential hypertension        Treatment Plan:  ED Disposition     ED Disposition Condition Date/Time Comment    Discharge  Thu Nov 04, 2017 11:04 AM [name] discharge to home/self care.    Condition at disposition: Stable            _______________________________      Attestations: This note is prepared by Kathi Simpers, acting as scribe for Docia Chuck, MD.    Docia Chuck, MD - The scribe's documentation has been prepared under my direction and personally reviewed by me in its entirety.  I confirm that the note above accurately reflects all work, treatment, procedures, and medical decision making performed by me.    _______________________________     Laren Boom, MD  11/04/17 1537

## 2017-11-04 NOTE — ED Triage Notes (Signed)
See quick triage note.

## 2017-11-05 ENCOUNTER — Telehealth (INDEPENDENT_AMBULATORY_CARE_PROVIDER_SITE_OTHER): Payer: Self-pay

## 2017-11-05 NOTE — Telephone Encounter (Signed)
ED Follow Up CALL:    EDVisit Date: 11/04/17  Primary Discharge ZO:XWRUE pain  Secondary Dx: dizziness  Follow up Appt with PCP: 0  Call placed to patient to follow up recent hospital discharge, assess current status, address questions/concerns regarding discharge instructions / medications and encourage patient to schedule Hospital Discharge follow up appt with PCP; No Answer; LVMM with contact information and request for return call.  Will continue to follow.

## 2017-11-07 LAB — ECG 12-LEAD
Atrial Rate: 52 {beats}/min
P Axis: 56 degrees
P-R Interval: 160 ms
Q-T Interval: 418 ms
QRS Duration: 82 ms
QTC Calculation (Bezet): 388 ms
R Axis: -10 degrees
T Axis: 11 degrees
Ventricular Rate: 52 {beats}/min

## 2017-11-09 ENCOUNTER — Encounter (INDEPENDENT_AMBULATORY_CARE_PROVIDER_SITE_OTHER): Payer: Self-pay

## 2017-11-09 ENCOUNTER — Encounter (INDEPENDENT_AMBULATORY_CARE_PROVIDER_SITE_OTHER): Payer: Self-pay | Admitting: Cardiology

## 2017-11-09 ENCOUNTER — Ambulatory Visit (INDEPENDENT_AMBULATORY_CARE_PROVIDER_SITE_OTHER): Payer: Medicare Other | Admitting: Cardiology

## 2017-11-09 VITALS — BP 159/93 | HR 59 | Ht 61.0 in | Wt 135.0 lb

## 2017-11-09 DIAGNOSIS — R0789 Other chest pain: Secondary | ICD-10-CM

## 2017-11-09 DIAGNOSIS — R002 Palpitations: Secondary | ICD-10-CM

## 2017-11-09 DIAGNOSIS — R1013 Epigastric pain: Secondary | ICD-10-CM

## 2017-11-09 DIAGNOSIS — I10 Essential (primary) hypertension: Secondary | ICD-10-CM

## 2017-11-09 DIAGNOSIS — G459 Transient cerebral ischemic attack, unspecified: Secondary | ICD-10-CM

## 2017-11-09 DIAGNOSIS — R9431 Abnormal electrocardiogram [ECG] [EKG]: Secondary | ICD-10-CM

## 2017-11-09 MED ORDER — ATENOLOL 25 MG PO TABS
25.0000 mg | ORAL_TABLET | Freq: Every day | ORAL | 3 refills | Status: DC
Start: 2017-11-09 — End: 2018-01-25

## 2017-11-09 NOTE — Progress Notes (Signed)
[name] [phone]. with a history of HTN presents for cardiac follow up    Recent hospitalization for chest pain. Stress test normal      Cardiac work up has included   - stress test (11/06/17 - normal myocardial perfusion)  - cardiac cath ( 01/08/14 - normal )  - echocardiogram ( EF 55%, mild TR)  - 24 hour holter - no significant events    She has had symptomatic hypotension on current blood pressure regimen (  atenolol 50 mg ). norvasc was discontinued during last visit.    Palpitations  - daily  - no syncope  - no significant events on 30 day event monitor    Chest pain  - atypical  - normal cardiac cath        Dysphagia  Odynophagia  Dyspepsia  Hx of bariatric surgery    Past Medical History:   Diagnosis Date   . Asthma    . Chest pain 2016     Chest pain after eating x6  months--being followed by GI for GERD   . Constipation    . Difficulty walking     amb with cane secondary to arthritis bil knees   . Genital herpes     no current outbreak (10/03/15)   . GERD (gastroesophageal reflux disease)    . Hiatal hernia     h/o surgery   . Hyperlipidemia    . Hypertensive disorder     Well controlled on med. Denies any SOB in last 6 months (10/03/15)   . Low back pain    . Morbid obesity with BMI of 40.0-44.9, adult     BMI 40.9   . Nausea without vomiting    . OSA on CPAP 2015    CPAP nightly x 1 yr   . Osteoarthritis of knees, bilateral     and back   . TIA (transient ischemic attack) 2014    TIA 2014-no residual. Patient evaulated for possible TIA 07/12/2015  @ [facility] (dischage summary in epic)-per summary CT of head was normal and patient was d/c'd     Family History   Problem Relation Age of Onset   . Cancer Mother 57        breast cancer   . Breast cancer Mother    . Heart disease Father    . Malignant hyperthermia Neg Hx    . Pseudochol deficiency Neg Hx      Current Outpatient Prescriptions   Medication Sig  Dispense Refill   . albuterol (PROVENTIL HFA;VENTOLIN HFA) 108 (90 BASE) MCG/ACT inhaler Inhale into the lungs every 4 (four) hours as needed.        . B Complex Vitamins (VITAMIN-B COMPLEX) Tab Take  by Mouth Once a Day.     . cholecalciferol (VITAMIN D-1000 MAX ST) 1000 units tablet Take 1,000 Units by Mouth Once a Day.     . diazePAM (VALIUM) 2 MG tablet Take 1 tablet (2 mg total) by mouth every 8 (eight) hours as needed for Anxiety. 30 tablet 2   . fluticasone (FLONASE) 50 MCG/ACT nasal spray 2 sprays by Nasal route daily. (Patient taking differently: 2 sprays by Nasal route as needed.   ) 16 g 2   . ondansetron (ZOFRAN-ODT) 4 MG disintegrating tablet Take 1 tablet (4 mg total) by mouth every 6 (six) hours as needed for Nausea. 10 tablet 0   . pantoprazole (PROTONIX) 40 MG tablet TAKE 1 TABLET(40 MG) BY MOUTH DAILY 90 tablet  0   . valACYclovir HCL (VALTREX) 500 MG tablet TAKE 1 TABLET(500 MG) BY MOUTH DAILY 90 tablet 0   . vitamin A 16109 UNIT capsule Take 10,000 Units by Mouth Once a Day.     Marland Kitchen atenolol (TENORMIN) 25 MG tablet Take 1 tablet (25 mg total) by mouth daily. 90 tablet 3     No current facility-administered medications for this visit.             PE:    Vitals:    11/09/17 0946   BP: (!) 159/93   Pulse: (!) 59     Body mass index is 25.51 kg/m.    General:  nad  cv rr  Lung cta  abd s  Ext no edema      Labs:  Lipid Panel   Cholesterol   Date/Time Value Ref Range Status   04/30/2017 07:19 AM 187 <200 mg/dL Final     Triglycerides   Date/Time Value Ref Range Status   04/30/2017 07:19 AM 55 <150 mg/dL Final   60/45/4098 11:91 AM 63 34 - 149 mg/dL Final     HDL   Date/Time Value Ref Range Status   04/30/2017 07:19 AM 90 >50 mg/dL Final       CMP:   Sodium   Date/Time Value Ref Range Status   11/04/2017 10:02 AM 145 136 - 145 mEq/L Final     Potassium   Date/Time Value Ref Range Status   11/04/2017 10:02 AM 4.7 3.5 - 5.1 mEq/L Final     Chloride   Date/Time Value Ref Range Status   11/04/2017 10:02 AM  108 100 - 111 mEq/L Final   04/30/2017 07:19 AM 106 98 - 110 mmol/L Final     CO2   Date/Time Value Ref Range Status   11/04/2017 10:02 AM 23 22 - 29 mEq/L Final     Glucose   Date/Time Value Ref Range Status   11/04/2017 10:02 AM 75 70 - 100 mg/dL Final     Comment:     ADA guidelines for diabetes mellitus:  Fasting:  Equal to or greater than 126 mg/dL  Random:   Equal to or greater than 200 mg/dL       BUN   Date/Time Value Ref Range Status   11/04/2017 10:02 AM 10.0 7.0 - 19.0 mg/dL Final     Protein, Total   Date/Time Value Ref Range Status   11/04/2017 10:02 AM 7.1 6.0 - 8.3 g/dL Final   47/82/9562 13:08 AM 6.6 6.1 - 8.1 g/dL Final     Alkaline Phosphatase   Date/Time Value Ref Range Status   11/04/2017 10:02 AM 93 37 - 106 U/L Final     AST (SGOT)   Date/Time Value Ref Range Status   11/04/2017 10:02 AM 30 5 - 34 U/L Final     ALT   Date/Time Value Ref Range Status   11/04/2017 10:02 AM 9 0 - 55 U/L Final     Anion Gap   Date/Time Value Ref Range Status   11/04/2017 10:02 AM 14.0 5.0 - 15.0 Final       CBC:   WBC   Date/Time Value Ref Range Status   11/04/2017 10:02 AM 4.00 3.50 - 10.80 x10 3/uL Final   06/30/2009 04:03 PM 9.12 3.50 - 10.80 /CUMM Final     RBC   Date/Time Value Ref Range Status   11/04/2017 10:02 AM 4.48 4.20 - 5.40 x10 6/uL Final  Hemoglobin   Date/Time Value Ref Range Status   04/30/2017 07:19 AM 12.9 11.7 - 15.5 g/dL Final     Hgb   Date/Time Value Ref Range Status   11/04/2017 10:02 AM 12.6 12.0 - 16.0 g/dL Final     Hematocrit   Date/Time Value Ref Range Status   11/04/2017 10:02 AM 39.1 37.0 - 47.0 % Final     MCV   Date/Time Value Ref Range Status   11/04/2017 10:02 AM 87.3 80.0 - 100.0 fL Final     MCHC   Date/Time Value Ref Range Status   11/04/2017 10:02 AM 32.2 32.0 - 36.0 g/dL Final     RDW   Date/Time Value Ref Range Status   11/04/2017 10:02 AM 15 12 - 15 % Final     Platelets   Date/Time Value Ref Range Status   11/04/2017 10:02 AM 230 140 - 400 x10 3/uL Final   04/30/2017  07:19 AM 284 140 - 400 Thousand/uL Final           Impression / plan    HTN              - episodic symptomatic hypotension. Will decrease atenolol 25 mg daily  Palpitations              - no significant event on event monitor; continue atenolol  Dyspepsia              - GI fu       Naisha Wisdom Dan Humphreys  11/09/2017

## 2017-11-10 ENCOUNTER — Other Ambulatory Visit (INDEPENDENT_AMBULATORY_CARE_PROVIDER_SITE_OTHER): Payer: Self-pay | Admitting: Family Medicine

## 2017-11-12 ENCOUNTER — Encounter (INDEPENDENT_AMBULATORY_CARE_PROVIDER_SITE_OTHER): Payer: Self-pay | Admitting: Internal Medicine

## 2017-11-12 ENCOUNTER — Ambulatory Visit (INDEPENDENT_AMBULATORY_CARE_PROVIDER_SITE_OTHER): Payer: Medicare Other | Admitting: Internal Medicine

## 2017-11-12 VITALS — BP 149/84 | HR 77 | Temp 97.9°F | Wt 135.0 lb

## 2017-11-12 DIAGNOSIS — R110 Nausea: Secondary | ICD-10-CM

## 2017-11-12 DIAGNOSIS — Z9884 Bariatric surgery status: Secondary | ICD-10-CM

## 2017-11-12 DIAGNOSIS — R0789 Other chest pain: Secondary | ICD-10-CM

## 2017-11-12 DIAGNOSIS — K219 Gastro-esophageal reflux disease without esophagitis: Secondary | ICD-10-CM

## 2017-11-12 DIAGNOSIS — I10 Essential (primary) hypertension: Secondary | ICD-10-CM

## 2017-11-12 NOTE — Progress Notes (Signed)
Subjective:       Patient ID: Amber [name] is a 67 y.o. female.    HPI  Pt is here for follow up     1. Was adm at [facility] overnight few days ago at [facility] hosp for chest pain,   CTA, chest, abd/pelvis, no acute problem,   Nuclear stress test neg.    2. HTN, amlodipine d/c'd due to low readings, tenormin reduced to 25mg  daily  Home BP 120's/80's, feeling better,   3. S/p gastric bypass, Appetite ok , weight stable, 3 meals/day, +nauseated after meals.   Saw GI, had EGD c/w GERD, taking zofran   4. Thiamine def, taking B complex  Iron def, not taking supplement,   Had labs done to check on vitamin levels recently, will forward copy of results for review    The following portions of the patient's history were reviewed and updated as appropriate: allergies, current medications, past family history, past medical history, past social history, past surgical history and problem list.    Review of Systems   Constitutional: Negative for appetite change, fever and unexpected weight change.   Respiratory: Negative for shortness of breath.    Cardiovascular: Negative for chest pain and palpitations.   Gastrointestinal: Positive for nausea. Negative for abdominal pain, diarrhea and vomiting.   Genitourinary: Negative for dysuria and frequency.   Musculoskeletal: Negative for myalgias.   Neurological: Negative for weakness, light-headedness, numbness and headaches.   Hematological: Negative for adenopathy.     BP 149/84   Pulse 77   Temp 97.9 F (36.6 C) (Oral)   Wt 61.2 kg (135 lb)   BMI 25.51 kg/m        Objective:    Physical Exam   Constitutional: She appears well-developed. No distress.   HENT:   Mouth/Throat: Oropharynx is clear and moist. No oropharyngeal exudate.   Eyes: Conjunctivae are normal. No scleral icterus.   Neck: Neck supple. No JVD present. Carotid bruit is not present. No thyromegaly present.   Cardiovascular: Normal rate, regular rhythm, normal heart sounds and intact distal pulses.  Exam reveals  no gallop and no friction rub.    No murmur heard.  Pulmonary/Chest: Effort normal and breath sounds normal. No respiratory distress. She has no wheezes. She has no rales.   Abdominal: Soft. Bowel sounds are normal. She exhibits no distension and no mass. There is no tenderness. There is no rebound and no guarding.   Musculoskeletal: She exhibits no edema.   Neurological: She is alert.           Assessment:       1. Atypical chest pain     2. Essential hypertension     3. Gastroesophageal reflux disease, esophagitis presence not specified     4. Nausea     5. Bariatric surgery status            Plan:      Procedures  No orders of the defined types were placed in this encounter.      Continue meds  Monitor BP  F/u GI, bariatrics  Continue with vitamin supplements  Forward lab results for review  F/u 3 months

## 2017-11-15 LAB — VITAMIN B12: Vitamin B-12: 433 pg/mL (ref 200–1100)

## 2017-11-15 LAB — LIPID PANEL
Cholesterol / HDL Ratio: 2.4 (calc) (ref <5.0)
Cholesterol: 175 mg/dL (ref <200)
HDL: 72 mg/dL (ref >50)
LDL Calculated: 89 mg/dL (calc)
NON HDL CHOLESTEROL: 103 mg/dL (calc) (ref <130)
Triglycerides: 53 mg/dL (ref <150)

## 2017-11-15 LAB — CBC AND DIFFERENTIAL
Baso(Absolute): 39 cells/uL (ref 0–200)
Basophils: 1 %
Eosinophils Absolute: 90 cells/uL (ref 15–500)
Eosinophils: 2.3 %
Hematocrit: 38 % (ref 35.0–45.0)
Hemoglobin: 12.5 g/dL (ref 11.7–15.5)
Lymphocytes Absolute: 1268 cells/uL (ref 850–3900)
Lymphocytes: 32.5 %
MCH: 28.2 pg (ref 27.0–33.0)
MCHC: 32.9 g/dL (ref 32.0–36.0)
MCV: 85.6 fL (ref 80.0–100.0)
MPV: 10.5 fL (ref 7.5–12.5)
Monocytes Absolute: 343 cells/uL (ref 200–950)
Monocytes: 8.8 %
Neutrophils Absolute: 2161 cells/uL (ref 1500–7800)
Neutrophils: 55.4 %
Platelets: 266 10*3/uL (ref 140–400)
RBC: 4.44 10*6/uL (ref 3.80–5.10)
RDW: 14.4 % (ref 11.0–15.0)
WBC: 3.9 10*3/uL (ref 3.8–10.8)

## 2017-11-15 LAB — COPPER, SERUM: Copper: 127 ug/dL (ref 70–175)

## 2017-11-15 LAB — COMPREHENSIVE METABOLIC PANEL
ALT: 10 U/L (ref 6–29)
AST (SGOT): 14 U/L (ref 10–35)
Albumin/Globulin Ratio: 1.9 (calc) (ref 1.0–2.5)
Albumin: 4.2 g/dL (ref 3.6–5.1)
Alkaline Phosphatase: 86 U/L (ref 33–130)
BUN: 9 mg/dL (ref 7–25)
Bilirubin, Total: 0.4 mg/dL (ref 0.2–1.2)
CO2: 29 mmol/L (ref 20–32)
Calcium: 9.4 mg/dL (ref 8.6–10.4)
Chloride: 107 mmol/L (ref 98–110)
Creatinine: 0.57 mg/dL (ref 0.50–0.99)
EGFR African American: 113 mL/min/{1.73_m2} (ref >=60)
Globulin: 2.2 g/dL (calc) (ref 1.9–3.7)
Glucose: 83 mg/dL (ref 65–99)
NON-AFRICAN AMERICA EGFR: 97 mL/min/{1.73_m2} (ref >=60)
Potassium: 3.6 mmol/L (ref 3.5–5.3)
Protein, Total: 6.4 g/dL (ref 6.1–8.1)
Sodium: 143 mmol/L (ref 135–146)

## 2017-11-15 LAB — IRON: Iron: 47 ug/dL (ref 45–160)

## 2017-11-15 LAB — VITAMIN D,25 OH,TOTAL: Vitamin D, 25 OH, Total: 38 ng/mL (ref 30–100)

## 2017-11-15 LAB — PTH, INTACT AND CALCIUM
Calcium: 9.4 mg/dL (ref 8.6–10.4)
PARATHYROID HORMONE INTACT: 41 pg/mL (ref 14–64)

## 2017-11-15 LAB — TRANSFERRIN: Transferrin: 308 mg/dL (ref 188–341)

## 2017-11-15 LAB — VITAMIN B1, PLASMA: Vitamin B1 (Thiamine): 10 nmol/L (ref 8–30)

## 2017-11-15 LAB — FERRITIN: Ferritin: 12 ng/mL — ABNORMAL LOW (ref 20–288)

## 2017-11-17 ENCOUNTER — Ambulatory Visit (INDEPENDENT_AMBULATORY_CARE_PROVIDER_SITE_OTHER): Payer: Commercial Managed Care - HMO | Admitting: Family Medicine

## 2017-11-21 ENCOUNTER — Emergency Department: Payer: Medicare Other

## 2017-11-21 ENCOUNTER — Emergency Department
Admission: EM | Admit: 2017-11-21 | Discharge: 2017-11-21 | Disposition: A | Discharge status: Home / Self Care | Payer: Medicare Other | Attending: Emergency Medicine | Admitting: Emergency Medicine

## 2017-11-21 DIAGNOSIS — Y9341 Activity, dancing: Secondary | ICD-10-CM | POA: Insufficient documentation

## 2017-11-21 DIAGNOSIS — G4733 Obstructive sleep apnea (adult) (pediatric): Secondary | ICD-10-CM | POA: Insufficient documentation

## 2017-11-21 DIAGNOSIS — Z8673 Personal history of transient ischemic attack (TIA), and cerebral infarction without residual deficits: Secondary | ICD-10-CM | POA: Insufficient documentation

## 2017-11-21 DIAGNOSIS — Z79899 Other long term (current) drug therapy: Secondary | ICD-10-CM | POA: Insufficient documentation

## 2017-11-21 DIAGNOSIS — I10 Essential (primary) hypertension: Secondary | ICD-10-CM | POA: Insufficient documentation

## 2017-11-21 DIAGNOSIS — X58XXXA Exposure to other specified factors, initial encounter: Secondary | ICD-10-CM | POA: Insufficient documentation

## 2017-11-21 DIAGNOSIS — M25562 Pain in left knee: Secondary | ICD-10-CM | POA: Insufficient documentation

## 2017-11-21 DIAGNOSIS — J45909 Unspecified asthma, uncomplicated: Secondary | ICD-10-CM | POA: Insufficient documentation

## 2017-11-21 DIAGNOSIS — K219 Gastro-esophageal reflux disease without esophagitis: Secondary | ICD-10-CM | POA: Insufficient documentation

## 2017-11-21 MED ORDER — LIDOCAINE 5 % EX PTCH
1.00 | MEDICATED_PATCH | Freq: Every day | CUTANEOUS | 0 refills | Status: DC
Start: 2017-11-21 — End: 2018-04-27

## 2017-11-21 MED ORDER — IBUPROFEN 600 MG PO TABS
600.0000 mg | ORAL_TABLET | Freq: Once | ORAL | Status: AC
Start: 2017-11-21 — End: 2017-11-21
  Administered 2017-11-21: 07:00:00 600 mg via ORAL
  Filled 2017-11-21: qty 1

## 2017-11-21 MED ORDER — LIDOCAINE 5 % EX PTCH
1.00 | MEDICATED_PATCH | CUTANEOUS | Status: DC
Start: 2017-11-21 — End: 2017-11-21
  Administered 2017-11-21: 07:00:00 1 via TRANSDERMAL
  Filled 2017-11-21: qty 1

## 2017-11-21 MED ORDER — FAMOTIDINE 20 MG PO TABS
20.0000 mg | ORAL_TABLET | Freq: Once | ORAL | Status: AC
Start: 2017-11-21 — End: 2017-11-21
  Administered 2017-11-21: 07:00:00 20 mg via ORAL
  Filled 2017-11-21: qty 1

## 2017-11-21 MED ORDER — ACETAMINOPHEN 500 MG PO TABS
1000.0000 mg | ORAL_TABLET | Freq: Once | ORAL | Status: AC
Start: 2017-11-21 — End: 2017-11-21
  Administered 2017-11-21: 07:00:00 1000 mg via ORAL
  Filled 2017-11-21: qty 2

## 2017-11-21 NOTE — ED Provider Notes (Signed)
EMERGENCY DEPARTMENT NOTE    Physician/Midlevel provider first contact with patient: 11/21/17 0627         HISTORY OF PRESENT ILLNESS   Historian: Patient  Translator Used: No    Chief Complaint: Knee Pain            67 y.o. female with a past medical history of hypertension who presents to emergency room after feeling a pop sensation in her left knee while dancing yesterday and has had progressively worsening pain and swelling on the medial aspect of the left knee.  Symptoms are worse with ambulating down the stairs.  She denies any locking or popping of the knee joint.  During ambulation.  Resting and elevation.  Improves her symptoms and ambulating and flexing/extending the knee aggravate symptoms.  She denies falling on the knee.  She denies any prior history of knee injury.  She does state that she has bilateral knee arthritis at baseline.  She denies trying any over-the-counter pain medicines or taking any prescription pain medications to treat her knee pain. Pain is 10/10 in severity while palpating or manipulating the knee and then improves to an 6/10 in severity.     MEDICAL HISTORY     Past Medical History:  Past Medical History:   Diagnosis Date   . Asthma    . Chest pain 2016     Chest pain after eating x6  months--being followed by GI for GERD   . Constipation    . Difficulty walking     amb with cane secondary to arthritis bil knees   . Genital herpes     no current outbreak (10/03/15)   . GERD (gastroesophageal reflux disease)    . Hiatal hernia     h/o surgery   . Hyperlipidemia    . Hypertensive disorder     Well controlled on med. Denies any SOB in last 6 months (10/03/15)   . Low back pain    . Morbid obesity with BMI of 40.0-44.9, adult     BMI 40.9   . Nausea without vomiting    . OSA on CPAP 2015    CPAP nightly x 1 yr   . Osteoarthritis of knees, bilateral     and back   . TIA (transient ischemic attack) 2014    TIA 2014-no residual. Patient evaulated for possible TIA 07/12/2015  @ [facility]  (dischage summary in epic)-per summary CT of head was normal and patient was d/c'd       Past Surgical History:  Past Surgical History:   Procedure Laterality Date   . APPENDECTOMY  >25 yrs   . CHOLECYSTECTOMY     . EGD  01/2015   . EGD, BIOPSY N/A 09/15/2017    Procedure: EGD, BIOPSY;  Surgeon: Pershing Proud, MD;  Location: [facility];  Service: Gastroenterology;  Laterality: N/A;   . fallopian tube surgery  >25 yrs   . INSERTION, PAIN PUMP (MEDICAL) N/A 10/22/2015    Procedure: INSERTION, PAIN PUMP (MEDICAL);  Surgeon: Nicola Police, DO;  Location: [place] [facility];  Service: General;  Laterality: N/A;   . LAPAROSCOPIC, CHOLECYSTECTOMY, CHOLANGIOGRAM  08/13/2014   . LAPAROSCOPIC, GASTRIC BYPASS N/A 10/22/2015    Procedure: LAPAROSCOPIC, GASTRIC BYPASS;  Surgeon: Josefa Half R, DO;  Location: [place] [facility];  Service: General;  Laterality: N/A;   . LAPAROSCOPIC, HERNIORRHAPHY, HIATAL N/A 10/22/2015    Procedure: LAPAROSCOPIC, HERNIORRHAPHY, HIATAL;  Surgeon: Josefa Half R, DO;  Location: [facility];  Service: General;  Laterality: N/A;   . OVARY SURGERY Right >25 yrs   . SPINE SURGERY  11/2013    cervical HNP x 2, Dr. Bufford Buttner       Social History:  Social History     Social History   . Marital status: Single     Spouse name: N/A   . Number of children: 2   . Years of education: N/A     Occupational History   . legal instrument examiner [employer]      Social History Main Topics   . Smoking status: Never Smoker   . Smokeless tobacco: Never Used   . Alcohol use No   . Drug use: No   . Sexual activity: No     Other Topics Concern   . Dietary Supplements / Vitamins Yes   . Anesthesia Problems No   . Blood Thinners No   . Eats Large Amounts No   . Excessive Sweets No   . Skips Meals No   . Eats Excessive Starches Yes   . Snacks Or Grazes No   . Emotional Eater Yes   . Eats Fried Food Yes   . Eats Fast Food Yes   . Diet Center No   . Hmr No   . [name] No   . La Weight Loss  No   . Nutri-System No   . Opti-Fast / Medi-Fast No   . Overeaters Anonymous No   . Physicians Weight Loss Center No   . Tops No   . Weight Watchers No   . Atkins No   . Binging / Purging No   . Body For Life No   . Cabbage Soup No   . Calorie Counting No   . Fasting No   . [name] No   . Health Spa No   . Herbal Life No   . High Protein No   . Low Carb No   . Low Fat No   . Mayo [facility] Diet No   . [place] Diet No   . Margie Billet Diet No   . [place] Diet No   . Slim Fast No   . [place] No   . Sugar Busters No   . Vomiting No   . Zone Diet No   . Stationary Cycle Or Treadmill No   . Gym/Fitness Classes No   . Home Exercise/Video No   . Swimming No   . Team Sports No   . Weight Training No   . Walking Or Running No   . Hospitalization No   . Hypnosis No   . Physical Therapy No   . Psychological Therapy No   . Residential Program No   . Acutrim No   . Amphetamines No   . Anorex No   . Byetta No   . Dexatrim No   . Didrex No   . Fastin No   . Fen - Phen No   . Ionamin / Adipex No   . Mazanor No   . Meridia No   . Obalan No   . Phendiet No   . Phentrol No   . Phenteramine Yes   . Plegine No   . Pondimin No   . Qsymia No   . Prozac No   . Redux No   . Sanorex No   . Tenuate No   . Tepanole No   . Wechless No   .  Wellbutrin No   . Xenical (Orlistat, Alli) No   . Other Med No   . No Impairment Yes     Chronic Bilat Knee Pain   . Walks With Cane/Crutch Yes   . Requires A Wheelchair No   . Bedridden No   . Are You Currently Being Treated For Depression? No   . Do You Snore? Yes   . Are You Receiving Any Medical Or Psychological Services? No   . Do You Have Or Have You Been Treated For An Eating Disorder? No   . Do You Exercise Regularly? No   . Have You Or Family Member Ever Have Trouble With Anesthesia? No     Social History Narrative   . No narrative on file       Family History:  Family History   Problem Relation Age of Onset   . Cancer Mother 70        breast cancer   . Breast cancer Mother    . Heart  disease Father    . Malignant hyperthermia Neg Hx    . Pseudochol deficiency Neg Hx        Outpatient Medication:  Discharge Medication List as of 11/21/2017  7:17 AM      CONTINUE these medications which have NOT CHANGED    Details   albuterol (PROVENTIL HFA;VENTOLIN HFA) 108 (90 BASE) MCG/ACT inhaler Inhale into the lungs every 4 (four) hours as needed.   , Starting 01/26/2012, Until Discontinued, Historical Med      atenolol (TENORMIN) 25 MG tablet Take 1 tablet (25 mg total) by mouth daily., Starting Tue 11/09/2017, Normal      B Complex Vitamins (VITAMIN-B COMPLEX) Tab Take  by Mouth Once a Day., Historical Med      cholecalciferol (VITAMIN D-1000 MAX ST) 1000 units tablet Take 1,000 Units by Mouth Once a Day., Historical Med      diazePAM (VALIUM) 2 MG tablet Take 1 tablet (2 mg total) by mouth every 8 (eight) hours as needed for Anxiety., Starting Tue 07/13/2017, Print      fluticasone (FLONASE) 50 MCG/ACT nasal spray 2 sprays by Nasal route daily., Starting 01/02/2014, Until Discontinued, Normal      ondansetron (ZOFRAN-ODT) 4 MG disintegrating tablet Take 1 tablet (4 mg total) by mouth every 6 (six) hours as needed for Nausea., Starting Tue 08/17/2017, Print      pantoprazole (PROTONIX) 40 MG tablet TAKE 1 TABLET(40 MG) BY MOUTH DAILY, Normal      valACYclovir HCL (VALTREX) 500 MG tablet TAKE 1 TABLET(500 MG) BY MOUTH DAILY, Normal      vitamin A 16109 UNIT capsule Take 10,000 Units by Mouth Once a Day., Historical Med             PMH, SH, FH, Meds in chart reviewed and agreed  REVIEW OF SYSTEMS   ROS    A pertinent 10 point review of systems was performed and was normal except as otherwise noted. Please also see HPI for added review of systems      PHYSICAL EXAM     ED Triage Vitals   Enc Vitals Group      BP 11/21/17 0622 168/80      Heart Rate 11/21/17 0622 79      Resp Rate 11/21/17 0622 20      Temp 11/21/17 0622 98.2 F (36.8 C)      Temp Source 11/21/17 0622 Oral      SpO2 11/21/17 0622 96 %  Weight  11/21/17 0622 61.2 kg      Height 11/21/17 0622 1.549 m      Head Circumference --       Peak Flow --       Pain Score 11/21/17 0625 10      Pain Loc --       Pain Edu? --       Excl. in GC? --        GENERAL: Nontoxic, no acute distress, alert, oriented x 4  HEENT: MMM  NECK: supple  LUNGS: Normal respirations.  CV: RRR, normal S1/S2,   GI: soft, nontender, normoactive bowel sounds.  GU: no suprapubic tenderness.  BACK: no deformity .  EXT:  Flexion and extension of the left knee causes increased pain. There is tenderness and slight swelling on the medial aspect of the left knee within the joint space.  Unable to perform valgus and varus stress due to pain.  There is no clicking or popping. Lachman's maneuver demonstrates no laxity.   SKIN: warm and dry, no ulcerations.  NEURO:  moving all 4 extremities on demand without deficit.    MEDICAL DECISION MAKING     DISCUSSION    67 y.o. female who presents to emergency room after atraumatic episode while dancing yesterday and having progressively worsening pain especially with ambulation on the medial aspect of the left knee.  Patient most likely has a partial tear of the medial collateral ligament or a meniscal injury.  However, there is no clicking or locking of the knee joint.  Due to pain."  I am unable to perform more valgus and varus stress.  Patient was given acetaminophen, ibuprofen, and a lidocaine patch to improve her symptoms.  X-ray performed to rule out fractures.  Patient placed in a knee immobilizer. I discussed having her follow-up with orthopedic surgery for reevaluation within 1 week.  She should also obtain an MRI outpatient as soon as possible for further assessment of non-bony injury.         Vital Signs: I have reviewed the patient?s vital signs.   Nursing Notes: I have reviewed and utilized available nursing notes.  Medical Records Reviewed: Reviewed available past medical records.  Counseling:  I have personally spoken with the patient and  discussed today?s findings, in addition to providing specific details for the plan of care and return precautions.  All questions answered and there is agreement with the plan.        RADIOLOGY IMAGING STUDIES      Knee 4+ Views Left   Final Result    Tricompartmental degenerative changes. No joint effusion.      Lorinda Creed, MD    11/21/2017 7:11 AM            PULSE OXIMETRY    Oxygen Saturation by Pulse Oximetry: 96%  Interventions: none  Interpretation:  Interpretation of oxygen level is normal    EMERGENCY DEPT. MEDICATIONS      ED Medication Orders     Start Ordered     Status Ordering Provider    11/21/17 [phone] 11/21/17 0638  famotidine (PEPCID) tablet 20 mg  Once     Route: Oral  Ordered Dose: 20 mg     Last MAR action:  Given Stanislav Gervase A    11/21/17 0639 11/21/17 0638    Every 24 hours     Route: Transdermal  Ordered Dose: 1 patch     Discontinued Markeis Allman A    11/21/17  1610 11/21/17 9604  acetaminophen (TYLENOL) tablet 1,000 mg  Once     Route: Oral  Ordered Dose: 1,000 mg     Last MAR action:  Given Safir Michalec A    11/21/17 5409 11/21/17 0637  ibuprofen (ADVIL,MOTRIN) tablet 600 mg  Once     Route: Oral  Ordered Dose: 600 mg     Last MAR action:  Given Krishana Lutze A          LABORATORY RESULTS    Ordered and independently interpreted AVAILABLE laboratory tests. Please see results section in chart for full details.  Results for orders placed or performed in visit on 11/10/17   Vitamin D,25 OH, Total   Result Value Ref Range    Vitamin D, 25 OH, Total 38 30 - 100 ng/mL       CRITICAL CARE/PROCEDURES    Procedures    DIAGNOSIS      Diagnosis:  Final diagnoses:   Acute pain of left knee       Disposition:  ED Disposition     ED Disposition Condition Date/Time Comment    Discharge  Sun Nov 21, 2017  7:17 AM [name] discharge to home/self care.    Condition at disposition: Stable          Prescriptions:  Discharge Medication List as of 11/21/2017  7:17 AM      CONTINUE these medications which  have NOT CHANGED    Details   albuterol (PROVENTIL HFA;VENTOLIN HFA) 108 (90 BASE) MCG/ACT inhaler Inhale into the lungs every 4 (four) hours as needed.   , Starting 01/26/2012, Until Discontinued, Historical Med      atenolol (TENORMIN) 25 MG tablet Take 1 tablet (25 mg total) by mouth daily., Starting Tue 11/09/2017, Normal      B Complex Vitamins (VITAMIN-B COMPLEX) Tab Take  by Mouth Once a Day., Historical Med      cholecalciferol (VITAMIN D-1000 MAX ST) 1000 units tablet Take 1,000 Units by Mouth Once a Day., Historical Med      diazePAM (VALIUM) 2 MG tablet Take 1 tablet (2 mg total) by mouth every 8 (eight) hours as needed for Anxiety., Starting Tue 07/13/2017, Print      fluticasone (FLONASE) 50 MCG/ACT nasal spray 2 sprays by Nasal route daily., Starting 01/02/2014, Until Discontinued, Normal      ondansetron (ZOFRAN-ODT) 4 MG disintegrating tablet Take 1 tablet (4 mg total) by mouth every 6 (six) hours as needed for Nausea., Starting Tue 08/17/2017, Print      pantoprazole (PROTONIX) 40 MG tablet TAKE 1 TABLET(40 MG) BY MOUTH DAILY, Normal      valACYclovir HCL (VALTREX) 500 MG tablet TAKE 1 TABLET(500 MG) BY MOUTH DAILY, Normal      vitamin A 81191 UNIT capsule Take 10,000 Units by Mouth Once a Day., Historical Med                  Ronnie Derby, MD  11/21/17 2018

## 2017-11-21 NOTE — EDIE (Signed)
COLLECTIVE?NOTIFICATION?11/21/2017 06:17?ERIONA, KINCHEN F?MRN: [id number]    This patient has registered at the [facility] Emergency Room: [facility] Emergency Department   For more information visit: [email or web address]   Criteria Met      5 ED Visits in 12 Months    Security Events  No recent Security Events currently on file    ED Care Guidelines  There are currently no ED Care Guidelines for this patient. Please check your facility's medical records system.    Care Providers  There are no care providers on record at this time.   E.D. Visit Count (12 mo.)  Facility Visits   [place] Emergency Room: [facility] [place] 1   [place] - [facility] 1   [place] [facility] 1   [place] Emergency Room: [facility] 5   Total 8   Note: Visits indicate total known visits.      Recent Emergency Department Visit Summary  Date Facility [facility] Type Major Type Diagnoses or Chief Complaint   Nov 21, 2017 [facility] Emergency Room: [facility] [employer] [place] [facility]  Emergency      TRIAGE - Pain on Left Knee      Nov 04, 2017 [place] Emergency Room: [facility] [place] [facility]  Emergency      Hypertention,dizziness      Chest Pain      Dizziness      Other fatigue      Bariatric surgery status      Essential (primary) hypertension      Postgastric surgery syndromes          Recent Inpatient Visit Summary  No recorded inpatient visits.         The above information is provided for the sole purpose of patient treatment. Use of this information beyond the terms of Data Sharing Memorandum of Understanding and License Agreement is prohibited. In certain cases not all visits may be represented. Consult the aforementioned facilities for additional information.   ? 2019 [employer] - [place], [place] - [email or web address]

## 2017-11-21 NOTE — ED Triage Notes (Signed)
Alert orientedx4 taken to room per wheelchair c/o of left knee pain for 1 week. Unable to put weight on the knee.

## 2017-11-21 NOTE — Discharge Instructions (Signed)
Knee Pain NOS    You have been seen for knee pain.    There are a few causes for knee pain. The doctor feels your knee pain is not from an injury to your knee's bones or ligaments.     Injury to the ligaments or bones is not the only cause of knee pain. There are other causes. These include:   Tendonitis. This is the inflammation (swelling) of the tendons. Tendons are the thick cords that connect the muscles around the knee to the bones of the knee joint.   Bursitis. This is the inflammation (swelling) of the fluid-filled sacs that cushion the knee joint.   Arthritis (inflammation of joints).   Gout (swelling of the joints).   Knee injuries from overuse.    Some things you can do to treat your knee pain are:   Apply ice to the knee with an ice pack. Be sure to put a towel between the ice pack and your skin. NEVER PLACE DIRECTLY ON YOUR SKIN. You can do this for 15 minutes at a time, several times a day.   Use anti-inflammatory medicine like ibuprofen (Advil or Motrin) to help the pain and swelling.   Avoid doing things that put a lot of stress on your knee joints. This includes running or playing tennis.    See an orthopedic surgeon about your knee pain.    YOU SHOULD SEEK MEDICAL ATTENTION IMMEDIATELY, EITHER HERE OR AT THE NEAREST EMERGENCY DEPARTMENT, IF ANY OF THE FOLLOWING OCCUR:   Your knee pain gets worse.   You have fever (temperature higher than 100.4F / 38C) or chills or your knee gets more red or warm.   You have any other problems or concerns.

## 2017-11-22 ENCOUNTER — Encounter (INDEPENDENT_AMBULATORY_CARE_PROVIDER_SITE_OTHER): Payer: Self-pay | Admitting: Family

## 2017-11-22 ENCOUNTER — Ambulatory Visit (INDEPENDENT_AMBULATORY_CARE_PROVIDER_SITE_OTHER): Payer: Medicare Other | Admitting: Family

## 2017-11-22 VITALS — BP 153/82 | HR 63 | Temp 97.4°F | Ht 61.0 in | Wt 134.1 lb

## 2017-11-22 DIAGNOSIS — Z719 Counseling, unspecified: Secondary | ICD-10-CM

## 2017-11-22 DIAGNOSIS — K219 Gastro-esophageal reflux disease without esophagitis: Secondary | ICD-10-CM

## 2017-11-22 DIAGNOSIS — R1013 Epigastric pain: Secondary | ICD-10-CM

## 2017-11-22 DIAGNOSIS — E569 Vitamin deficiency, unspecified: Secondary | ICD-10-CM

## 2017-11-22 DIAGNOSIS — E663 Overweight: Secondary | ICD-10-CM

## 2017-11-22 DIAGNOSIS — Z09 Encounter for follow-up examination after completed treatment for conditions other than malignant neoplasm: Secondary | ICD-10-CM

## 2017-11-22 DIAGNOSIS — E519 Thiamine deficiency, unspecified: Secondary | ICD-10-CM

## 2017-11-22 DIAGNOSIS — Z713 Dietary counseling and surveillance: Secondary | ICD-10-CM

## 2017-11-22 DIAGNOSIS — Z9884 Bariatric surgery status: Secondary | ICD-10-CM

## 2017-11-22 DIAGNOSIS — Z1321 Encounter for screening for nutritional disorder: Secondary | ICD-10-CM

## 2017-11-22 DIAGNOSIS — M25562 Pain in left knee: Secondary | ICD-10-CM

## 2017-11-22 DIAGNOSIS — Z6825 Body mass index (BMI) 25.0-25.9, adult: Secondary | ICD-10-CM

## 2017-11-22 DIAGNOSIS — R110 Nausea: Secondary | ICD-10-CM

## 2017-11-22 NOTE — Progress Notes (Signed)
Progress Note    Patient Name: Amber, [name]  Age: 67 y.o.  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 She has had 83 lbs total weight loss since day of surgery.   She is tolerating a regular diet and denies any vomiting or reflux. She complains of epigastric abdominal pain after eating which is accompanied by nausea and occasional dry heaves. She had a recent EGD to evaluate this and it showed acid reflux but no other acute issues per her report. She has tried carafate but says that it makes her feel sick so she has been suffering with these issues without relief despite taking her pantoprazole daily.   She is compliant with vitamin supplementation and portion sizes.    She is drinking adequate fluids daily.   Her energy level is good.   She is not exercising regularly secondary to a left leg injury but plans to restart.   She is very happy with her weight loss to date.     Past Medical History:     Past Medical History:   Diagnosis Date   . Asthma    . Chest pain 2016     Chest pain after eating x6  months--being followed by GI for GERD   . Constipation    . Difficulty walking     amb with cane secondary to arthritis bil knees   . Genital herpes     no current outbreak (10/03/15)   . GERD (gastroesophageal reflux disease)    . Hiatal hernia     h/o surgery   . Hyperlipidemia    . Hypertensive disorder     Well controlled on med. Denies any SOB in last 6 months (10/03/15)   . Low back pain    . Morbid obesity with BMI of 40.0-44.9, adult     BMI 40.9   . Nausea without vomiting    . OSA on CPAP 2015    CPAP nightly x 1 yr   . Osteoarthritis of knees, bilateral     and back   . TIA (transient ischemic attack) 2014    TIA 2014-no residual. Patient evaulated for possible TIA 07/12/2015  @ [facility] (dischage summary in epic)-per summary CT of head was normal and patient was d/c'd       Past Surgical History:      Past Surgical History:   Procedure Laterality Date   . APPENDECTOMY  >25 yrs   . CHOLECYSTECTOMY     . EGD  01/2015   . EGD, BIOPSY N/A 09/15/2017    Procedure: EGD, BIOPSY;  Surgeon: Pershing Proud, MD;  Location: [facility];  Service: Gastroenterology;  Laterality: N/A;   . fallopian tube surgery  >25 yrs   . INSERTION, PAIN PUMP (MEDICAL) N/A 10/22/2015    Procedure: INSERTION, PAIN PUMP (MEDICAL);  Surgeon: Nicola Police, DO;  Location: [place] [facility];  Service: General;  Laterality: N/A;   . LAPAROSCOPIC, CHOLECYSTECTOMY, CHOLANGIOGRAM  08/13/2014   . LAPAROSCOPIC, GASTRIC BYPASS N/A 10/22/2015    Procedure: LAPAROSCOPIC, GASTRIC BYPASS;  Surgeon: Jeanell Sparrow, Hamid R, DO;  Location: [place] [facility];  Service: General;  Laterality: N/A;   . LAPAROSCOPIC, HERNIORRHAPHY, HIATAL N/A 10/22/2015    Procedure: LAPAROSCOPIC, HERNIORRHAPHY, HIATAL;  Surgeon: Nicola Police, DO;  Location: [place] [facility];  Service: General;  Laterality: N/A;   .  OVARY SURGERY Right >25 yrs   . SPINE SURGERY  11/2013    cervical HNP x 2, Dr. Bufford Buttner       Family History:     Family History   Problem Relation Age of Onset   . Cancer Mother 65        breast cancer   . Breast cancer Mother    . Heart disease Father    . Malignant hyperthermia Neg Hx    . Pseudochol deficiency Neg Hx        Allergies:     Allergies   Allergen Reactions   . Acyclovir Nausea And Vomiting   . Amoxicillin-Pot Clavulanate      Other reaction(s): gi distress   . Aspirin Nausea And Vomiting     Other reaction(s): gi distress  Upsets stomach  Able to tolerate enteric coated aspirin   . Atorvastatin      Palpitation   . Lovastatin Nausea And Vomiting   . Metronidazole Nausea And Vomiting   . Moxifloxacin Nausea And Vomiting     GI symptoms   . Other Nausea And Vomiting   . Rosuvastatin      Palpitation, chest pain, abd pain    . Statins    . Sulfa Antibiotics Nausea And Vomiting     Other reaction(s): gi distress       Medications:     Prior  to Admission medications    Medication Sig Start Date End Date Taking? Authorizing Provider   albuterol (PROVENTIL HFA;VENTOLIN HFA) 108 (90 BASE) MCG/ACT inhaler Inhale into the lungs every 4 (four) hours as needed.    01/26/12  Yes [provider]   atenolol (TENORMIN) 25 MG tablet Take 1 tablet (25 mg total) by mouth daily. 11/09/17  Yes Darnelle Maffucci, MD   B Complex Vitamins (VITAMIN-B COMPLEX) Tab Take  by Mouth Once a Day.   Yes [provider]   cholecalciferol (VITAMIN D-1000 MAX ST) 1000 units tablet Take 1,000 Units by Mouth Once a Day.   Yes [provider]   diazePAM (VALIUM) 2 MG tablet Take 1 tablet (2 mg total) by mouth every 8 (eight) hours as needed for Anxiety. 07/13/17  Yes Bui, Flonnie Overman, MD   fluticasone (FLONASE) 50 MCG/ACT nasal spray 2 sprays by Nasal route daily.  Patient taking differently: 2 sprays by Nasal route as needed.    01/02/14  Yes Bui, Flonnie Overman, MD   IRON PO Take 29 mg by mouth daily.   Yes [provider]   lidocaine (LIDODERM) 5 % Place 1 patch onto the skin daily.Remove & Discard patch within 12 hours or as directed by MD 11/21/17  Yes Larina Bras, MD   ondansetron (ZOFRAN-ODT) 4 MG disintegrating tablet Take 1 tablet (4 mg total) by mouth every 6 (six) hours as needed for Nausea. 08/17/17  Yes Herma Ard, MD   pantoprazole (PROTONIX) 40 MG tablet TAKE 1 TABLET(40 MG) BY MOUTH DAILY 11/04/17  Yes Bui, Flonnie Overman, MD   valACYclovir HCL (VALTREX) 500 MG tablet TAKE 1 TABLET(500 MG) BY MOUTH DAILY 09/30/17  Yes Bui, Flonnie Overman, MD   vitamin A 16109 UNIT capsule Take 10,000 Units by Mouth Once a Day.    [provider]       Review of Systems:     Constitutional: negative for fevers, night sweats  Head and neck: negative for visual changes, eye pain, dry mouth, or hearing impairment  Respiratory: negative for  SOB, cough  Cardiovascular: negative for chest pain, palpitations  Gastrointestinal: negative for abdominal  pain or bloating  Genitourinary: negative for hematuria, dysuria  Musculoskeletal: negative for bone pain, myalgias and stiff joints  Skin: no rash, no itching, no wounds  Neurological: negative for dizziness, gait problems, headaches, or memory problems  Behavioral/Psych: negative for fatigue, loss of interest in favorite activities, sleep disturbance  Endocrine: negative for temperature intolerance      Physical Exam:   BP 153/82 (BP Site: Right arm, Patient Position: Sitting)   Pulse 63   Temp 97.4 F (36.3 C) (Oral)   Ht 5\' 1"    Wt 134 lb 1.9 oz   BMI 25.34 kg/m   BMI: Body mass index is 25.34 kg/m.  Previous Weight:   Wt Readings from Last 15 Encounters:   11/22/17 134 lb 1.9 oz   11/21/17 135 lb   11/12/17 135 lb   11/09/17 135 lb   11/04/17 135 lb   10/21/17 135 lb   09/14/17 135 lb   08/30/17 137 lb   08/17/17 130 lb   07/16/17 130 lb   07/08/17 135 lb   07/03/17 130 lb   07/02/17 130 lb   06/14/17 134 lb 3.2 oz   06/05/17 125 lb        Appearance: comfortable, no acute distress, well nourished  HEENT: normocephalic, clear conjunctiva   Musculoskeletal: grossly intact ROM and motor strength  Extremities: no edema, no calf pain  Skin: no rash or ecchymosis   Neuro: alert and oriented x 3, able to move all extremities, gait steady  Psych: calm and cooperative       Labs Reviewed:     Lab Results   Component Value Date    WBC 3.9 11/10/2017    HGB 12.5 11/10/2017    HCT 38.0 11/10/2017    MCV 85.6 11/10/2017    PLT 266 11/10/2017     '  Chemistry        Component Value Date/Time    NA 143 11/10/2017 0740    K 3.6 11/10/2017 0740    CL 107 11/10/2017 0740    CO2 29 11/10/2017 0740    BUN 9 11/10/2017 0740    CREAT 0.57 11/10/2017 0740    GLU 83 11/10/2017 0740        Component Value Date/Time    CA 9.4 11/10/2017 0740    CA 9.4 11/10/2017 0740    ALKPHOS 86 11/10/2017 0740    AST 14 11/10/2017 0740    ALT 10 11/10/2017 0740    BILITOTAL 0.4 11/10/2017 0740          Lab Results   Component Value Date     ALT 10 11/10/2017    AST 14 11/10/2017    ALKPHOS 86 11/10/2017    BILITOTAL 0.4 11/10/2017     Lab Results   Component Value Date    VITD 38 11/10/2017    VITD 33 04/30/2017    VITD 28 04/30/2017    VITD 5 04/30/2017     Lab Results   Component Value Date    B12 433 11/10/2017     Lab Results   Component Value Date    VITAMINB1 10 11/10/2017     Lab Results   Component Value Date    VITAMINARETI 44 09/25/2015     Cholesterol   Date Value Ref Range Status   11/10/2017 175 <200 mg/dL Final   16/08/9603 540 <200 mg/dL Final  11/09/2016 169 <200 mg/dL Final     Triglycerides   Date Value Ref Range Status   11/10/2017 53 <150 mg/dL Final   66/44/0347 55 <425 mg/dL Final   95/63/8756 51 <433 mg/dL Final   29/51/8841 63 34 - 149 mg/dL Final   66/04/3015 76 35 - 135 mg/dL Final   11/10/3233 82 10 - 190 md/dL Final     HDL   Date Value Ref Range Status   11/10/2017 72 >50 mg/dL Final   57/32/2025 90 >42 mg/dL Final   70/62/3762 70 >83 mg/dL Final     CHOL/HDL Ratio   Date Value Ref Range Status   11/10/2017 2.4 <5.0 (calc) Final   04/30/2017 2.1 <5.0 (calc) Final   11/09/2016 2.4 <5.0 (calc) Final     Lab Results   Component Value Date    TSH 1.04 04/30/2017     Lab Results   Component Value Date    PTH 41 11/10/2017    CA 9.4 11/10/2017    CA 9.4 11/10/2017    PHOS 3.5 06/05/2017     Lab Results   Component Value Date    IRON 47 11/10/2017    TIBC 371 04/30/2017    FERRITIN 12 (L) 11/10/2017     Lab Results   Component Value Date    HGBA1C 5.0 04/30/2017       Rads:   Radiological Procedure reviewed.    Radiology Results (24 Hour)     ** No results found for the last 24 hours. 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  She is doing well with  reported dietary compliance. However, she is struggling with epigastric pain likely secondary to her reflux and advised to follow up with gastroenterology for this issue in the near future.     Labs were  reviewed. We will continue to monitor.     1. Continue monitoring and measuring portions. Continue protein from supplements daily and continue fluid intake for a minimum of 64 oz daily.   2. Restart exercise as tolerated.  3. Follow up with PCP regularly. Follow up with GI as discussed.   4. Medications reviewed. Continue vitamins with any changes as noted above.    5. Routine care in 3-6 months with labs or sooner if needed.       Signed by: Lonna Cobb, FNP-BC    This note was generated by the [facility] EMR system/Dragon speech recognition and may contain inherent errors or omissions not intended by the user. Grammatical errors, random word insertions, deletions, pronoun errors and incomplete sentences are occasional consequences of this technology due to software limitations. Not all errors are caught or corrected. If there are questions or concerns about the content of this note or information contained within the body of this dictation they should be addressed directly with the author for clarification.

## 2017-11-23 ENCOUNTER — Ambulatory Visit (INDEPENDENT_AMBULATORY_CARE_PROVIDER_SITE_OTHER): Payer: Medicare Other | Admitting: Cardiology

## 2017-11-23 ENCOUNTER — Encounter (INDEPENDENT_AMBULATORY_CARE_PROVIDER_SITE_OTHER): Payer: Self-pay | Admitting: Internal Medicine

## 2017-11-23 ENCOUNTER — Encounter (INDEPENDENT_AMBULATORY_CARE_PROVIDER_SITE_OTHER): Payer: Self-pay | Admitting: Cardiology

## 2017-11-23 VITALS — BP 169/90 | HR 65 | Temp 97.8°F | Resp 17 | Ht 61.0 in | Wt 135.0 lb

## 2017-11-23 DIAGNOSIS — I10 Essential (primary) hypertension: Secondary | ICD-10-CM

## 2017-11-23 DIAGNOSIS — R002 Palpitations: Secondary | ICD-10-CM

## 2017-11-23 DIAGNOSIS — Z01818 Encounter for other preprocedural examination: Secondary | ICD-10-CM

## 2017-11-23 DIAGNOSIS — E785 Hyperlipidemia, unspecified: Secondary | ICD-10-CM

## 2017-11-23 NOTE — Progress Notes (Signed)
Amber [name] [id number]. with a history of HTN presents for cardiac follow up    Blood pressure well controlled on atenolol 25 mg daily. Home blood pressure reading reviewed      Cardiac work up has included              - stress test (11/06/17 - normal myocardial perfusion)  - cardiac cath ( 01/08/14 - normal )  - echocardiogram ( EF 55%, mild TR)  - 24 hour holter - no significant events        Palpitations  - daily  - no syncope  - no significant events on 30 day event monitor    Chest pain  - atypical  - normal cardiac cath        Dysphagia  Odynophagia  Dyspepsia  Hx of bariatric surgery    Recent knee pain from line dancing. Walking with brace and 2 crutches. Scheduled to see orthopedic MD today    Past Medical History:   Diagnosis Date   . Asthma    . Chest pain 2016     Chest pain after eating x6  months--being followed by GI for GERD   . Constipation    . Difficulty walking     amb with cane secondary to arthritis bil knees   . Genital herpes     no current outbreak (10/03/15)   . GERD (gastroesophageal reflux disease)    . Hiatal hernia     h/o surgery   . Hyperlipidemia    . Hypertensive disorder     Well controlled on med. Denies any SOB in last 6 months (10/03/15)   . Low back pain    . Morbid obesity with BMI of 40.0-44.9, adult     BMI 40.9   . Nausea without vomiting    . OSA on CPAP 2015    CPAP nightly x 1 yr   . Osteoarthritis of knees, bilateral     and back   . TIA (transient ischemic attack) 2014    TIA 2014-no residual. Patient evaulated for possible TIA 07/12/2015  @ [facility] (dischage summary in epic)-per summary CT of head was normal and patient was d/c'd     Family History   Problem Relation Age of Onset   . Cancer Mother 72        breast cancer   . Breast cancer Mother    . Heart disease Father    . Malignant hyperthermia Neg Hx    . Pseudochol deficiency Neg Hx      Current Outpatient  Prescriptions   Medication Sig Dispense Refill   . albuterol (PROVENTIL HFA;VENTOLIN HFA) 108 (90 BASE) MCG/ACT inhaler Inhale into the lungs every 4 (four) hours as needed.        Marland Kitchen atenolol (TENORMIN) 25 MG tablet Take 1 tablet (25 mg total) by mouth daily. 90 tablet 3   . B Complex Vitamins (VITAMIN-B COMPLEX) Tab Take  by Mouth Once a Day.     . cholecalciferol (VITAMIN D-1000 MAX ST) 1000 units tablet Take 1,000 Units by Mouth Once a Day.     . diazePAM (VALIUM) 2 MG tablet Take 1 tablet (2 mg total) by mouth every 8 (eight) hours as needed for Anxiety. 30 tablet 2   . fluticasone (FLONASE) 50 MCG/ACT nasal spray 2 sprays by Nasal route daily. (Patient taking differently: 2 sprays by Nasal route as needed.   ) 16 g 2   . IRON PO Take 29 mg  by mouth daily.     Marland Kitchen lidocaine (LIDODERM) 5 % Place 1 patch onto the skin daily.Remove & Discard patch within 12 hours or as directed by MD 15 each 0   . ondansetron (ZOFRAN-ODT) 4 MG disintegrating tablet Take 1 tablet (4 mg total) by mouth every 6 (six) hours as needed for Nausea. 10 tablet 0   . pantoprazole (PROTONIX) 40 MG tablet TAKE 1 TABLET(40 MG) BY MOUTH DAILY 90 tablet 0   . valACYclovir HCL (VALTREX) 500 MG tablet TAKE 1 TABLET(500 MG) BY MOUTH DAILY 90 tablet 0     No current facility-administered medications for this visit.           PE:    Vitals:    11/23/17 0847   BP: 169/90   Pulse: 65   Resp: 17   Temp: 97.8 F (36.6 C)   SpO2: 99%     Body mass index is 25.51 kg/m.    General:  nad  cv rr  Lung cta  abd s          Labs:  Lipid Panel   Cholesterol   Date/Time Value Ref Range Status   11/10/2017 07:40 AM 175 <200 mg/dL Final     Triglycerides   Date/Time Value Ref Range Status   11/10/2017 07:40 AM 53 <150 mg/dL Final   16/08/9603 54:09 AM 63 34 - 149 mg/dL Final     HDL   Date/Time Value Ref Range Status   11/10/2017 07:40 AM 72 >50 mg/dL Final       CMP:   Sodium   Date/Time Value Ref Range Status   11/10/2017 07:40 AM 143 135 - 146 mmol/L Final      Potassium   Date/Time Value Ref Range Status   11/10/2017 07:40 AM 3.6 3.5 - 5.3 mmol/L Final     Chloride   Date/Time Value Ref Range Status   11/10/2017 07:40 AM 107 98 - 110 mmol/L Final     CO2   Date/Time Value Ref Range Status   11/10/2017 07:40 AM 29 20 - 32 mmol/L Final     Glucose   Date/Time Value Ref Range Status   11/10/2017 07:40 AM 83 65 - 99 mg/dL Final     Comment:                   Fasting reference interval          BUN   Date/Time Value Ref Range Status   11/10/2017 07:40 AM 9 7 - 25 mg/dL Final     Protein, Total   Date/Time Value Ref Range Status   11/10/2017 07:40 AM 6.4 6.1 - 8.1 g/dL Final     Alkaline Phosphatase   Date/Time Value Ref Range Status   11/10/2017 07:40 AM 86 33 - 130 U/L Final     AST (SGOT)   Date/Time Value Ref Range Status   11/10/2017 07:40 AM 14 10 - 35 U/L Final     ALT   Date/Time Value Ref Range Status   11/10/2017 07:40 AM 10 6 - 29 U/L Final     Anion Gap   Date/Time Value Ref Range Status   11/04/2017 10:02 AM 14.0 5.0 - 15.0 Final       CBC:   WBC   Date/Time Value Ref Range Status   11/10/2017 07:40 AM 3.9 3.8 - 10.8 Thousand/uL Final   06/30/2009 04:03 PM 9.12 3.50 - 10.80 /CUMM Final     RBC  Date/Time Value Ref Range Status   11/10/2017 07:40 AM 4.44 3.80 - 5.10 Million/uL Final     Hemoglobin   Date/Time Value Ref Range Status   11/10/2017 07:40 AM 12.5 11.7 - 15.5 g/dL Final     Hematocrit   Date/Time Value Ref Range Status   11/10/2017 07:40 AM 38.0 35.0 - 45.0 % Final     MCV   Date/Time Value Ref Range Status   11/10/2017 07:40 AM 85.6 80.0 - 100.0 fL Final     MCHC   Date/Time Value Ref Range Status   11/10/2017 07:40 AM 32.9 32.0 - 36.0 g/dL Final     RDW   Date/Time Value Ref Range Status   11/10/2017 07:40 AM 14.4 11.0 - 15.0 % Final     Platelets   Date/Time Value Ref Range Status   11/10/2017 07:40 AM 266 140 - 400 Thousand/uL Final           Impression / plan    HTN   - now well controlled   - continue atenolol 25 mg daily   - does have element  of white coat HTN  Pre op evaluation   - no cardiac symptoms and normal cardiac cath in the past   - there are no cardiac contraindications to surgery (if needed for knee)   - ok to proceed with surgery / anesthesia from a cardiac standpoint    rtc 6 months      Amulya Quintin Dan Humphreys  11/23/2017

## 2017-11-25 ENCOUNTER — Encounter (INDEPENDENT_AMBULATORY_CARE_PROVIDER_SITE_OTHER): Payer: Self-pay | Admitting: Family

## 2017-12-24 ENCOUNTER — Encounter (INDEPENDENT_AMBULATORY_CARE_PROVIDER_SITE_OTHER): Payer: Self-pay

## 2017-12-24 NOTE — Progress Notes (Signed)
Patient called and stated that her BP has been elevated the past 2 days. Dr. Katrinka Blazing notified and ordered patient to increase her Atenolol to 50 mg per day. Patient notified.

## 2017-12-27 ENCOUNTER — Other Ambulatory Visit (HOSPITAL_BASED_OUTPATIENT_CLINIC_OR_DEPARTMENT_OTHER): Payer: Self-pay

## 2018-01-24 ENCOUNTER — Inpatient Hospital Stay: Payer: Medicare Other

## 2018-01-24 ENCOUNTER — Inpatient Hospital Stay
Admission: EM | Admit: 2018-01-24 | Discharge: 2018-01-25 | DRG: 069 | Disposition: A | Discharge status: Home / Self Care | Payer: Medicare Other | Attending: Internal Medicine | Admitting: Internal Medicine

## 2018-01-24 ENCOUNTER — Emergency Department: Payer: Medicare Other

## 2018-01-24 DIAGNOSIS — R51 Headache: Secondary | ICD-10-CM

## 2018-01-24 DIAGNOSIS — G459 Transient cerebral ischemic attack, unspecified: Principal | ICD-10-CM | POA: Diagnosis present

## 2018-01-24 DIAGNOSIS — R002 Palpitations: Secondary | ICD-10-CM

## 2018-01-24 DIAGNOSIS — G4733 Obstructive sleep apnea (adult) (pediatric): Secondary | ICD-10-CM | POA: Diagnosis present

## 2018-01-24 DIAGNOSIS — R202 Paresthesia of skin: Secondary | ICD-10-CM | POA: Diagnosis present

## 2018-01-24 DIAGNOSIS — R079 Chest pain, unspecified: Secondary | ICD-10-CM

## 2018-01-24 DIAGNOSIS — I10 Essential (primary) hypertension: Secondary | ICD-10-CM | POA: Diagnosis present

## 2018-01-24 DIAGNOSIS — E785 Hyperlipidemia, unspecified: Secondary | ICD-10-CM | POA: Diagnosis present

## 2018-01-24 DIAGNOSIS — R0789 Other chest pain: Secondary | ICD-10-CM | POA: Diagnosis present

## 2018-01-24 DIAGNOSIS — J45909 Unspecified asthma, uncomplicated: Secondary | ICD-10-CM | POA: Diagnosis present

## 2018-01-24 DIAGNOSIS — G43909 Migraine, unspecified, not intractable, without status migrainosus: Secondary | ICD-10-CM | POA: Diagnosis present

## 2018-01-24 DIAGNOSIS — R519 Headache, unspecified: Secondary | ICD-10-CM

## 2018-01-24 DIAGNOSIS — R200 Anesthesia of skin: Secondary | ICD-10-CM

## 2018-01-24 DIAGNOSIS — Z8673 Personal history of transient ischemic attack (TIA), and cerebral infarction without residual deficits: Secondary | ICD-10-CM

## 2018-01-24 DIAGNOSIS — K219 Gastro-esophageal reflux disease without esophagitis: Secondary | ICD-10-CM | POA: Diagnosis present

## 2018-01-24 LAB — COMPREHENSIVE METABOLIC PANEL
ALT: 13 U/L (ref 0–55)
AST (SGOT): 17 U/L (ref 5–34)
Albumin/Globulin Ratio: 1.4 (ref 0.9–2.2)
Albumin: 4 g/dL (ref 3.5–5.0)
Alkaline Phosphatase: 79 U/L (ref 37–106)
Anion Gap: 9 (ref 5.0–15.0)
BUN: 11 mg/dL (ref 7.0–19.0)
Bilirubin, Total: 0.4 mg/dL (ref 0.2–1.2)
CO2: 26 mEq/L (ref 22–29)
Calcium: 9.2 mg/dL (ref 8.5–10.5)
Chloride: 109 mEq/L (ref 100–111)
Creatinine: 0.7 mg/dL (ref 0.6–1.0)
Globulin: 2.8 g/dL (ref 2.0–3.6)
Glucose: 84 mg/dL (ref 70–100)
Potassium: 3.5 mEq/L (ref 3.5–5.1)
Protein, Total: 6.8 g/dL (ref 6.0–8.3)
Sodium: 144 mEq/L (ref 136–145)

## 2018-01-24 LAB — CBC AND DIFFERENTIAL
Absolute NRBC: 0 10*3/uL (ref 0.00–0.00)
Basophils Absolute Automated: 0.03 10*3/uL (ref 0.00–0.08)
Basophils Automated: 0.8 %
Eosinophils Absolute Automated: 0.09 10*3/uL (ref 0.00–0.44)
Eosinophils Automated: 2.3 %
Hematocrit: 39.6 % (ref 34.7–43.7)
Hgb: 12.5 g/dL (ref 11.4–14.8)
Immature Granulocytes Absolute: 0 10*3/uL (ref 0.00–0.07)
Immature Granulocytes: 0 %
Lymphocytes Absolute Automated: 1.57 10*3/uL (ref 0.42–3.22)
Lymphocytes Automated: 40.2 %
MCH: 27.9 pg (ref 25.1–33.5)
MCHC: 31.6 g/dL (ref 31.5–35.8)
MCV: 88.4 fL (ref 78.0–96.0)
MPV: 10 fL (ref 8.9–12.5)
Monocytes Absolute Automated: 0.4 10*3/uL (ref 0.21–0.85)
Monocytes: 10.2 %
Neutrophils Absolute: 1.82 10*3/uL (ref 1.10–6.33)
Neutrophils: 46.5 %
Nucleated RBC: 0 /100 WBC (ref 0.0–0.0)
Platelets: 218 10*3/uL (ref 142–346)
RBC: 4.48 10*6/uL (ref 3.90–5.10)
RDW: 15 % (ref 11–15)
WBC: 3.91 10*3/uL (ref 3.10–9.50)

## 2018-01-24 LAB — GFR: EGFR: >60

## 2018-01-24 LAB — GLUCOSE WHOLE BLOOD - POCT: Whole Blood Glucose POCT: 78 mg/dL (ref 70–100)

## 2018-01-24 LAB — PT/INR
PT INR: 1 (ref 0.9–1.1)
PT: 13.6 s (ref 12.6–15.0)

## 2018-01-24 LAB — SEDIMENTATION RATE: Sed Rate: 12 mm/Hr (ref 0–20)

## 2018-01-24 LAB — IHS D-DIMER: D-Dimer: 0.28 ug/mL FEU (ref 0.00–0.70)

## 2018-01-24 LAB — TROPONIN I: Troponin I: <0.01 ng/mL (ref 0.00–0.09)

## 2018-01-24 LAB — APTT: PTT: 28 s (ref 23–37)

## 2018-01-24 MED ORDER — FAMOTIDINE 20 MG PO TABS
20.0000 mg | ORAL_TABLET | Freq: Two times a day (BID) | ORAL | Status: DC
Start: 2018-01-24 — End: 2018-01-25
  Administered 2018-01-24 – 2018-01-25 (×2): 20 mg via ORAL
  Filled 2018-01-24 (×2): qty 1

## 2018-01-24 MED ORDER — ENOXAPARIN SODIUM 40 MG/0.4ML SC SOLN
40.00 mg | Freq: Every day | SUBCUTANEOUS | Status: DC
Start: 2018-01-24 — End: 2018-01-25
  Administered 2018-01-24 – 2018-01-25 (×2): 40 mg via SUBCUTANEOUS
  Filled 2018-01-24 (×2): qty 0.4

## 2018-01-24 MED ORDER — DIAZEPAM 2 MG PO TABS
2.0000 mg | ORAL_TABLET | Freq: Three times a day (TID) | ORAL | Status: DC | PRN
Start: 2018-01-24 — End: 2018-01-25

## 2018-01-24 MED ORDER — PROMETHAZINE HCL 25 MG/ML IJ SOLN
12.50 mg | Freq: Once | INTRAMUSCULAR | Status: AC
Start: 2018-01-24 — End: 2018-01-24
  Administered 2018-01-24: 16:00:00 12.5 mg via INTRAVENOUS
  Filled 2018-01-24: qty 1

## 2018-01-24 MED ORDER — ONDANSETRON HCL 4 MG/2ML IJ SOLN
4.00 mg | Freq: Four times a day (QID) | INTRAMUSCULAR | Status: DC | PRN
Start: 2018-01-24 — End: 2018-01-25

## 2018-01-24 MED ORDER — SODIUM CHLORIDE 0.9 % IV BOLUS
1000.00 mL | Freq: Once | INTRAVENOUS | Status: AC
Start: 2018-01-24 — End: 2018-01-24
  Administered 2018-01-24: 09:00:00 1000 mL via INTRAVENOUS

## 2018-01-24 MED ORDER — ACETAMINOPHEN 325 MG PO TABS
650.0000 mg | ORAL_TABLET | Freq: Once | ORAL | Status: AC
Start: 2018-01-24 — End: 2018-01-24
  Administered 2018-01-24: 08:00:00 650 mg via ORAL
  Filled 2018-01-24: qty 2

## 2018-01-24 MED ORDER — ASPIRIN 325 MG PO TABS
325.0000 mg | ORAL_TABLET | Freq: Once | ORAL | Status: AC
Start: 2018-01-24 — End: 2018-01-24
  Administered 2018-01-24: 07:00:00 325 mg via ORAL
  Filled 2018-01-24: qty 1

## 2018-01-24 MED ORDER — ONDANSETRON 4 MG PO TBDP
4.00 mg | ORAL_TABLET | Freq: Four times a day (QID) | ORAL | Status: DC | PRN
Start: 2018-01-24 — End: 2018-01-25

## 2018-01-24 MED ORDER — KETOROLAC TROMETHAMINE 30 MG/ML IJ SOLN
30.00 mg | Freq: Four times a day (QID) | INTRAMUSCULAR | Status: DC | PRN
Start: 2018-01-24 — End: 2018-01-25
  Administered 2018-01-24 – 2018-01-25 (×3): 30 mg via INTRAVENOUS
  Filled 2018-01-24 (×3): qty 1

## 2018-01-24 MED ORDER — FLUTICASONE PROPIONATE 50 MCG/ACT NA SUSP
2.00 | Freq: Every day | NASAL | Status: DC | PRN
Start: 2018-01-24 — End: 2018-01-25

## 2018-01-24 MED ORDER — FENTANYL CITRATE (PF) 50 MCG/ML IJ SOLN (WRAP)
25.00 ug | Freq: Once | INTRAMUSCULAR | Status: AC
Start: 2018-01-24 — End: 2018-01-24
  Administered 2018-01-24: 09:00:00 25 ug via INTRAVENOUS
  Filled 2018-01-24: qty 2

## 2018-01-24 MED ORDER — MAGNESIUM SULFATE IN D5W 1-5 GM/100ML-% IV SOLN
1.00 g | Freq: Two times a day (BID) | INTRAVENOUS | Status: DC
Start: 2018-01-24 — End: 2018-01-25
  Administered 2018-01-24 – 2018-01-25 (×2): 1 g via INTRAVENOUS
  Filled 2018-01-24 (×2): qty 100

## 2018-01-24 MED ORDER — CARVEDILOL 3.125 MG PO TABS
3.1250 mg | ORAL_TABLET | Freq: Two times a day (BID) | ORAL | Status: DC
Start: 2018-01-24 — End: 2018-01-25
  Administered 2018-01-25: 09:00:00 3.125 mg via ORAL
  Filled 2018-01-24 (×2): qty 1

## 2018-01-24 MED ORDER — ASPIRIN 325 MG PO TABS
325.0000 mg | ORAL_TABLET | Freq: Every day | ORAL | Status: AC
Start: 2018-01-24 — End: 2018-01-25
  Filled 2018-01-24: qty 1

## 2018-01-24 MED ORDER — LORAZEPAM 2 MG/ML IJ SOLN
0.50 mg | Freq: Once | INTRAMUSCULAR | Status: AC
Start: 2018-01-24 — End: 2018-01-24
  Administered 2018-01-24: 11:00:00 0.5 mg via INTRAVENOUS
  Filled 2018-01-24: qty 1

## 2018-01-24 MED ORDER — FAMOTIDINE 10 MG/ML IV SOLN (WRAP)
20.00 mg | Freq: Two times a day (BID) | INTRAVENOUS | Status: DC
Start: 2018-01-24 — End: 2018-01-25

## 2018-01-24 MED ORDER — ALBUTEROL SULFATE HFA 108 (90 BASE) MCG/ACT IN AERS
1.00 | INHALATION_SPRAY | Freq: Four times a day (QID) | RESPIRATORY_TRACT | Status: DC | PRN
Start: 2018-01-24 — End: 2018-01-25
  Filled 2018-01-24: qty 1

## 2018-01-24 MED ORDER — NALOXONE HCL 0.4 MG/ML IJ SOLN (WRAP)
0.20 mg | INTRAMUSCULAR | Status: DC | PRN
Start: 2018-01-24 — End: 2018-01-25

## 2018-01-24 MED ORDER — LIDOCAINE 5 % EX PTCH
1.00 | MEDICATED_PATCH | Freq: Every day | CUTANEOUS | Status: DC
Start: 2018-01-24 — End: 2018-01-25
  Administered 2018-01-24: 16:00:00 1 via TRANSDERMAL
  Filled 2018-01-24 (×2): qty 1

## 2018-01-24 MED ORDER — GADOBUTROL 1 MMOL/ML IV SOLN
7.50 mL | Freq: Once | INTRAVENOUS | Status: AC | PRN
Start: 2018-01-24 — End: 2018-01-24
  Administered 2018-01-24: 11:00:00 6 mL via INTRAVENOUS

## 2018-01-24 NOTE — ED Notes (Signed)
Notified MD for Headache and Tylenol 650 given and tolerated well.

## 2018-01-24 NOTE — OT Eval Note (Signed)
Occupational Therapy Cancellation Note    Patient: Amber [name]  NWG:[id number]    Unit: [id number]    Patient not seen for occupational therapy secondary to patient off unit. OT will follow.    Lillia Carmel, OTR/L  01/24/2018  11:11 AM

## 2018-01-24 NOTE — H&P (Signed)
ADMISSION HISTORY AND PHYSICAL EXAM    [facility], DIVISION OF HOSPITALIST MEDICINE   [facility]   [place] Pager: [phone]      Date Time: 01/24/18 4:12 PM  Patient Name: Amber [name]  Attending Physician: Carmelina Paddock, MD  Primary Care Physician: Oneita Jolly, MD    CC: headache     Assessment and plan     Active Hospital Problems    Diagnosis   . Chest pain   . Headache   . Essential hypertension   . Paresthesia       # Intractable headache  # TIA like symptoms  DDx TIA, Migraine, pseudutumour Cerebri  Neurology consulted  MRI/MRA head and neck  TTE  PT/OT  Aspirin  Lipid panel  Toradol, Phenergan for headache  LP discussed. Patient would like to think about it.    # Chest pain  Seen by cardiology, Chest pain is atypical and would not recommend further ischemic testing    # Hypertension  MP stable  Atenolol switched to Coreg      FULL CODE    History of Presenting Illness:   Amber [name] is a 67 y.o. female presenting with intractable headache, chest pain and TIA symptoms  Patient reported symptoms started one week ago. She has constant sharp central headache associated with blurry vision. She woke up this morning with left face and arm numbness  She also developed chest pain yesterday.   She went to ER. CT head no acute abnormalities. CXR, EKG unremarkable  She denies weakness, syncope, seizure    Past Medical History:     Past Medical History:   Diagnosis Date   . Asthma    . Chest pain 2016     Chest pain after eating x6  months--being followed by GI for GERD   . Constipation    . Difficulty walking     amb with cane secondary to arthritis bil knees   . Genital herpes     no current outbreak (10/03/15)   . GERD (gastroesophageal reflux disease)    . Hiatal hernia     h/o surgery   . Hyperlipidemia    . Hypertensive disorder     Well controlled on med. Denies any SOB in last 6 months (10/03/15)   . Low back pain    . Morbid obesity with BMI of 40.0-44.9, adult     BMI 40.9   .  Nausea without vomiting    . OSA on CPAP 2015    CPAP nightly x 1 yr   . Osteoarthritis of knees, bilateral     and back   . TIA (transient ischemic attack) 2014    TIA 2014-no residual. Patient evaulated for possible TIA 07/12/2015  @ [facility] (dischage summary in epic)-per summary CT of head was normal and patient was d/c'd         Past Surgical History:     Past Surgical History:   Procedure Laterality Date   . APPENDECTOMY  >25 yrs   . CHOLECYSTECTOMY     . EGD  01/2015   . EGD, BIOPSY N/A 09/15/2017    Procedure: EGD, BIOPSY;  Surgeon: Pershing Proud, MD;  Location: [facility];  Service: Gastroenterology;  Laterality: N/A;   . fallopian tube surgery  >25 yrs   . INSERTION, PAIN PUMP (MEDICAL) N/A 10/22/2015    Procedure: INSERTION, PAIN PUMP (MEDICAL);  Surgeon: Nicola Police, DO;  Location: [place] [facility];  Service: General;  Laterality: N/A;   . LAPAROSCOPIC, CHOLECYSTECTOMY, CHOLANGIOGRAM  08/13/2014   . LAPAROSCOPIC, GASTRIC BYPASS N/A 10/22/2015    Procedure: LAPAROSCOPIC, GASTRIC BYPASS;  Surgeon: Josefa Half R, DO;  Location: [place] [facility];  Service: General;  Laterality: N/A;   . LAPAROSCOPIC, HERNIORRHAPHY, HIATAL N/A 10/22/2015    Procedure: LAPAROSCOPIC, HERNIORRHAPHY, HIATAL;  Surgeon: Jeanell Sparrow, Hamid R, DO;  Location: [place] [facility];  Service: General;  Laterality: N/A;   . OVARY SURGERY Right >25 yrs   . SPINE SURGERY  11/2013    cervical HNP x 2, Dr. Bufford Buttner       Family History:     Family History   Problem Relation Age of Onset   . Cancer Mother 65        breast cancer   . Breast cancer Mother    . Heart disease Father    . Malignant hyperthermia Neg Hx    . Pseudochol deficiency Neg Hx        Social History:     History   Smoking Status   . Never Smoker   Smokeless Tobacco   . Never Used     History   Alcohol Use No     History   Drug Use No       Allergies:     Allergies   Allergen Reactions   . Acyclovir Nausea And Vomiting   . Amoxicillin-Pot Clavulanate      Other  reaction(s): gi distress   . Aspirin Nausea And Vomiting     Other reaction(s): gi distress  Upsets stomach  Able to tolerate enteric coated aspirin   . Atorvastatin      Palpitation   . Lovastatin Nausea And Vomiting   . Metronidazole Nausea And Vomiting   . Moxifloxacin Nausea And Vomiting     GI symptoms   . Other Nausea And Vomiting   . Rosuvastatin      Palpitation, chest pain, abd pain    . Statins    . Sulfa Antibiotics Nausea And Vomiting     Other reaction(s): gi distress       Medications:     Home Medications     Med List Status:  Pharmacy Completed Set By: Jenne Campus at 01/24/2018 10:27 AM                albuterol (PROVENTIL HFA;VENTOLIN HFA) 108 (90 BASE) MCG/ACT inhaler     Inhale into the lungs every 4 (four) hours as needed.        atenolol (TENORMIN) 25 MG tablet     Take 1 tablet (25 mg total) by mouth daily.     Patient taking differently:  Take 100 mg by mouth every morning.         B Complex Vitamins (VITAMIN-B COMPLEX) Tab     Take  by Mouth Once in the morning     cholecalciferol (VITAMIN D-1000 MAX ST) 1000 units tablet     Take 1,000 Units by Mouth Once a Day at lunch time     diazePAM (VALIUM) 2 MG tablet     Take 1 tablet (2 mg total) by mouth every 8 (eight) hours as needed for Anxiety.     fluticasone (FLONASE) 50 MCG/ACT nasal spray     2 sprays by Nasal route daily.     Patient taking differently:  2 sprays by Nasal route as needed.        lidocaine (LIDODERM) 5 %  Place 1 patch onto the skin daily.Remove & Discard patch within 12 hours or as directed by MD     pantoprazole (PROTONIX) 40 MG tablet     TAKE 1 TABLET(40 MG) BY MOUTH DAILY     Patient taking differently:  TAKE 1 TABLET(40 MG) BY MOUTH IN THE MORNING     valACYclovir HCL (VALTREX) 500 MG tablet     TAKE 1 TABLET(500 MG) BY MOUTH DAILY     Patient taking differently:  TAKE 1 TABLET(500 MG) BY MOUTH DAILY PRN                Review of Systems:   Positive for headache, numbness, chest pain  Negative for weakness,  syncope  All other systems were reviewed and are negative except:as above in HPI     Physical Exam:     Patient Vitals for the past 24 hrs:   BP Temp Temp src Pulse Resp SpO2 Height Weight   01/24/18 1608 120/64 97.5 F (36.4 C) Oral 60 20 100 % - -   01/24/18 1305 144/65 (!) 96.8 F (36 C) Oral (!) 59 20 99 % - -   01/24/18 1023 - - - - 20 - - -   01/24/18 1003 134/64 (!) 96.6 F (35.9 C) Oral (!) 52 22 99 % - 60.1 kg (132 lb 7.9 oz)   01/24/18 0802 134/73 98 F (36.7 C) Oral 62 18 100 % - -   01/24/18 0657 156/81 - - 64 14 100 % - -   01/24/18 0630 156/81 98.3 F (36.8 C) - 67 18 100 % 1.549 m (5\' 1" ) 60 kg (132 lb 4.8 oz)     Body mass index is 25.03 kg/m.  No intake or output data in the 24 hours ending 01/24/18 1612    General: awake;  no acute distress.  HEENT: perrla, eomi, sclera anicteric  oropharynx clear without lesions, mucous membranes moist  Neck: supple, no lymphadenopathy, no JVD, no carotid bruits  Cardiovascular: Normal S1 and S2, no murmurs, rubs or gallops  Lungs: clear to auscultation bilaterally, without wheezing, rhonchi, or rales  Abdomen: soft, non-tender, non-distended; no palpable masses, no hepatosplenomegaly, normoactive bowel sounds, no rebound or guarding  Extremities: no clubbing, cyanosis, or edema  Neuro: alert, oriented x 3, cranial nerves grossly intact, strength 5/5 in upper and lower extremities, sensation intact,   Skin: no rashes or lesions noted        Labs:     Results     Procedure Component Value Units Date/Time    Sedimentation rate (ESR) [[id number]] Collected:  01/24/18 0715    Specimen:  Blood Updated:  01/24/18 0748     Sed Rate 12 mm/Hr     Troponin I [[id number]] Collected:  01/24/18 0715    Specimen:  Blood Updated:  01/24/18 0745     Troponin I <0.01 ng/mL     Comprehensive metabolic panel [[id number]] Collected:  01/24/18 0715    Specimen:  Blood Updated:  01/24/18 0744     Glucose 84 mg/dL      BUN 84.6 mg/dL      Creatinine 0.7 mg/dL      Sodium 962 mEq/L       Potassium 3.5 mEq/L      Chloride 109 mEq/L      CO2 26 mEq/L      Calcium 9.2 mg/dL      Protein, Total 6.8 g/dL      Albumin 4.0 g/dL  AST (SGOT) 17 U/L      ALT 13 U/L      Alkaline Phosphatase 79 U/L      Bilirubin, Total 0.4 mg/dL      Globulin 2.8 g/dL      Albumin/Globulin Ratio 1.4     Anion Gap 9.0    GFR [[id number]] Collected:  01/24/18 0715     Updated:  01/24/18 0744     EGFR >60.0    APTT [[id number]] Collected:  01/24/18 0715     Updated:  01/24/18 0737     PTT 28 sec     Protime - INR [[id number]] Collected:  01/24/18 0715    Specimen:  Blood Updated:  01/24/18 0734     PT 13.6 sec      PT INR 1.0     PT Anticoag. Given Within 48 hrs. None    D-Dimer [[id number]] Collected:  01/24/18 0715     Updated:  01/24/18 0733     D-Dimer 0.28 ug/mL FEU     CBC with differential [[id number]] Collected:  01/24/18 0715    Specimen:  Blood from Blood Updated:  01/24/18 0731     WBC 3.91 x10 3/uL      Hgb 12.5 g/dL      Hematocrit 24.4 %      Platelets 218 x10 3/uL      RBC 4.48 x10 6/uL      MCV 88.4 fL      MCH 27.9 pg      MCHC 31.6 g/dL      RDW 15 %      MPV 10.0 fL      Neutrophils 46.5 %      Lymphocytes Automated 40.2 %      Monocytes 10.2 %      Eosinophils Automated 2.3 %      Basophils Automated 0.8 %      Immature Granulocyte 0.0 %      Nucleated RBC 0.0 /100 WBC      Neutrophils Absolute 1.82 x10 3/uL      Abs Lymph Automated 1.57 x10 3/uL      Abs Mono Automated 0.40 x10 3/uL      Abs Eos Automated 0.09 x10 3/uL      Absolute Baso Automated 0.03 x10 3/uL      Absolute Immature Granulocyte 0.00 x10 3/uL      Absolute NRBC 0.00 x10 3/uL     Glucose Whole Blood - POCT [[id number]] Collected:  01/24/18 6440     Updated:  01/24/18 0650     POCT - Glucose Whole blood 78 mg/dL           Imaging personally reviewed, including: all available   Ct Head Wo Contrast    Result Date: 01/24/2018   No acute intracranial findings or substantial change.  Nicki Reaper, MD 01/24/2018 6:57 AM    Mri Brain W Wo  Contrast    Result Date: 01/24/2018  No evidence of an acute stroke. 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 Joselyn Glassman, MD 01/24/2018 12:06 PM    Chest Ap Portable    Result Date: 01/24/2018  No acute findings or substantial change. Nicki Reaper, MD 01/24/2018 6:48 AM        DVT prophylaxis: lovenox    Signed by: Carmelina Paddock, MD   cc:Bui, Flonnie Overman, MD

## 2018-01-24 NOTE — PT Eval Note (Signed)
[facility]  [address]  [place], [place] [address]  [phone]    Physical Therapy Evaluation    Patient: Amber [name] MRN: [id number]   Unit: [facility] INTERMEDIATE CARE Bed: MI621/MI621-02    Time of Treatment: Time Calculation  PT Received On: 01/24/18  Start Time: 1222  Stop Time: 1240  Time Calculation (min): 18 min    Consult received for Amber [name] for PT evaluation and treatment.  Patient's medical condition is appropriate for Physical Therapy  intervention at this time.    D/C Suggestions     Recommendation  Discharge Recommendation: Home with no needs  DME Recommended for Discharge:  (none)  PT Frequency: one time visit      Transport Recommendations: no limitations    Assessment     Amber [name] is a 67 y.o. female admitted 01/24/2018.  Pt's functional mobility is at her baseline of mod indep gait/stairs.Pt demonstrates slow cadence and increased lateral sway due to B knee OA and chronic pain. Pt has no further acute PT needs. D/C PT.     Complexity Level Hx and Co  morbidites Examination Clinical Decision Making Clinical Presentation   Low  no impact 1-2 elements Limited options Stable       PMP - Progressive Mobility Protocol   PMP Activity: Step 7 - Walks out of Room  Distance Walked (ft) (Step 6,7): 200 Feet          Interdisciplinary Communication: Discussed with RN      Plan     Plan  Risks/Benefits/POC Discussed with Pt/Family: With patient  Treatment/Interventions: No skilled interventions needed at this time  PT Frequency: one time visit         Medical Diagnosis: Palpitations [R00.2]  Left sided numbness [R20.0]  Chest pain, unspecified type [R07.9]    History of Present Illness: Amber [name] is a 67 y.o. female admitted on  01/24/2018 with "left face and arm numbness, that started this morning when she woke up, her symptoms have currently resolved. He also states that since last 1 week she has had headaches, ongoing on a constant basis, described as pressure and  throbbing, in the vertex region, with some sound sensitivity,--->. She does not have a prior history of headaches. There is no report of any fevers, chills  ESR was normal scan of the head was normal"      Patient Active Problem List   Diagnosis   . Essential hypertension   . Anxiety state, unspecified   . Abnormal electrocardiogram   . Cerebral infarction   . Gastroesophageal reflux disease   . Hypernatremia   . Paresthesia   . Slow transit constipation   . Vitamin D deficiency   . Overactive bladder   . S/P laparoscopic cholecystectomy   . Transient cerebral ischemia, unspecified type   . Palpitations   . Pituitary microadenoma   . Dyslipidemia   . Other long term (current) drug therapy   . Headache   . History of spinal surgery   . Obstructive sleep apnea syndrome   . Osteoarthritis of knees, bilateral   . Pre-op evaluation   . Bariatric surgery status   . Nutritional deficiency   . Anxiety   . Epigastric abdominal tenderness without rebound tenderness   . Other chest pain   . Abdominal pain   . Chest pain     Past Medical History:   Diagnosis Date   . Asthma    . Chest pain  2016     Chest pain after eating x6  months--being followed by GI for GERD   . Constipation    . Difficulty walking     amb with cane secondary to arthritis bil knees   . Genital herpes     no current outbreak (10/03/15)   . GERD (gastroesophageal reflux disease)    . Hiatal hernia     h/o surgery   . Hyperlipidemia    . Hypertensive disorder     Well controlled on med. Denies any SOB in last 6 months (10/03/15)   . Low back pain    . Morbid obesity with BMI of 40.0-44.9, adult     BMI 40.9   . Nausea without vomiting    . OSA on CPAP 2015    CPAP nightly x 1 yr   . Osteoarthritis of knees, bilateral     and back   . TIA (transient ischemic attack) 2014    TIA 2014-no residual. Patient evaulated for possible TIA 07/12/2015  @ [facility] (dischage summary in epic)-per summary CT of head was normal and patient was d/c'd     Past Surgical History:    Procedure Laterality Date   . APPENDECTOMY  >25 yrs   . CHOLECYSTECTOMY     . EGD  01/2015   . EGD, BIOPSY N/A 09/15/2017    Procedure: EGD, BIOPSY;  Surgeon: Pershing Proud, MD;  Location: [facility];  Service: Gastroenterology;  Laterality: N/A;   . fallopian tube surgery  >25 yrs   . INSERTION, PAIN PUMP (MEDICAL) N/A 10/22/2015    Procedure: INSERTION, PAIN PUMP (MEDICAL);  Surgeon: Nicola Police, DO;  Location: [place] [facility];  Service: General;  Laterality: N/A;   . LAPAROSCOPIC, CHOLECYSTECTOMY, CHOLANGIOGRAM  08/13/2014   . LAPAROSCOPIC, GASTRIC BYPASS N/A 10/22/2015    Procedure: LAPAROSCOPIC, GASTRIC BYPASS;  Surgeon: Josefa Half R, DO;  Location: [place] [facility];  Service: General;  Laterality: N/A;   . LAPAROSCOPIC, HERNIORRHAPHY, HIATAL N/A 10/22/2015    Procedure: LAPAROSCOPIC, HERNIORRHAPHY, HIATAL;  Surgeon: Jeanell Sparrow, Hamid R, DO;  Location: [place] [facility] OR;  Service: General;  Laterality: N/A;   . OVARY SURGERY Right >25 yrs   . SPINE SURGERY  11/2013    cervical HNP x 2, Dr. Bufford Buttner       X-Rays/Tests/Labs:  Lab Results   Component Value Date/Time    HGB 12.5 01/24/2018 07:15 AM    HGB 12.5 11/10/2017 07:40 AM    HCT 39.6 01/24/2018 07:15 AM    K 3.5 01/24/2018 07:15 AM    NA 144 01/24/2018 07:15 AM    INR 1.0 01/24/2018 07:15 AM    TROPI <0.01 01/24/2018 07:15 AM    TROPI <0.01 11/04/2017 10:02 AM    TROPI 0.01 08/17/2017 11:20 AM    TROPI <0.01 07/16/2017 03:02 PM    TROPI 0.00 05/21/2008 07:33 AM     MRI Brain W WO Contrast (Order #[id number]) on 01/24/2018 - Imaging Information   IMPRESSION:   No evidence of an acute stroke.  Curvilinear lipoma of the corpus callosum.  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      CT Head WO Contrast (Order #[id number]) on 01/24/2018 - Imaging Information   IMPRESSION:      No acute intracranial findings or substantial change.      Social History:  Lives with her son in a house.  Entry Steps: 1   Rails: no  Inside steps: 2 to  kitchen, one flight to bedroom  Rails: yes  Equipment at home:  tub shower  Prior Level of Function:  Drives, shops, independent with IADL's, retired                   IT consultant: intact                        Mobility: independent              Feeding: independent              Grooming: independent              Bathing: independent - pt takes bathes              Dressing: independent              Toileting: independent    Subjective   Patient is agreeable to participation in the therapy session. Nursing clears patient for therapy.  Patient's Goal:  To go home  Pain: 3/10  Location: head  Therapist Intervention: positioned for comfort  Patient is satisfied with therapist intervention.    Objective     Precautions/ Contraindications:   Precautions  Weight Bearing Status: no restrictions    Patient is in bed with  Telemetry and Intravenous Access in place.    Observation of patient/vitals:      Vitals:    01/24/18 0802 01/24/18 1003 01/24/18 1023 01/24/18 1305   BP: 134/73 134/64  144/65   Pulse: 62 (!) 52  (!) 59   Resp: 18 22 20 20    Temp: 98 F (36.7 C) (!) 96.6 F (35.9 C)  (!) 96.8 F (36 C)   TempSrc: Oral Oral  Oral   SpO2: 100% 99%  99%   Weight:  60.1 kg (132 lb 7.9 oz)     Height:           Orientation/Cognition:  Alert and Oriented x 4  Cognition: WFL    Musculoskeletal Examination:      ROM Strength   RLE WFL 5/5   LLE WFL 5/5     Sensation: Intact to light touch, denies numbness/tingling to BLEs   Coordination: Intact to gross motor.    Functional Mobility:  Rolling: indep    Supine to sit: indep  Scooting: indep  Sit to Supine: indep  Sit to stand: indep  Stand to sit: indep  Transfers: indep  Ambulation:     Weightbearing: no restrictions   Assistance level: mod indep   Distance: 200'   Assistive Device: none   Gait Deviations: steady gait but antalgic due to B knee pain (hx OA)   Stairs: mod indep up/down 5 with reciprocal up and step to down    Balance:  Static Sit: good  Dynamic Sit: good  Static  Stand: good  Dynamic Stand: fair+    Endurance: good    Participation:  good    Education:  Educated the patient to role of physical therapy, plan of care, goals  of therapy and safety with mobility and ADLs.    Patient is in bed with all needs met, and equipment intact.  RN notified of session outcome.  Safety measures include: handoff to nurse/clin tech/ unit secretary completed, bed alarm activated, oriented to call bell and placed within reach, personal items within reach and bed placed in lowest position.  Mobility status posted at bedside and within E.M.R.  AM-PACT Inpatient Short Forms  Inpatient AM-PACT Performed? (PT): Basic Mobility Inpatient Short Form   AM-PACT "6 Clicks" Basic Mobility Inpatient Short Form  Turning Over in Bed: None  Sitting Down On/Standing From Armchair: None  Lying on Back to Sitting on Side of Bed: None  Assist Moving to/from Bed to Chair: None  Assist to Walk in Hospital Room: None  Assist to Climb 3-5 Steps with Railing: None  PT Basic Mobility Raw Score: 24  CMS 0-100% Score: 0.00%      Goals  Goals:  (D/C PT, pt at her baseline)       Signature:  Sedonia Small, PT  01/24/2018  3:47 PM  Phone: [phone]

## 2018-01-24 NOTE — EDIE (Signed)
COLLECTIVE?NOTIFICATION?01/24/2018 06:27?Amber, SZOSTAK [name]?MRN: [id number]    Criteria Met      5 ED Visits in 12 Months    Security and Safety  No recent Security Events currently on file    ED Care Guidelines  There are currently no ED Care Guidelines for this patient. Please check your facility's medical records system.          E.D. Visit Count (12 mo.)  Facility Visits   [place] [facility]: [facility] [place] 1   [place] - [facility] 1   [place] [facility] 1   [place] [facility]: [facility] 6   Total 9   Note: Visits indicate total known visits.      Recent Emergency Department Visit Summary  Date Facility [facility] Type Diagnoses or Chief Complaint   Jan 24, 2018 [facility] Emergency Room: [facility] [place] [facility]      Rapid Heart Rate      Nov 21, 2017 [place] [facility]: [facility] [place] [facility]      TRIAGE - Pain on Left Knee      Knee Pain      Pain in left knee      Nov 04, 2017 [place] [facility]. [place] [facility]      Hypertention,dizziness      Chest Pain      Dizziness      Other fatigue      Bariatric surgery status      Essential (primary) hypertension      Postgastric surgery syndromes      Aug 17, 2017 [place] [facility]: [facility] [place] [facility]      chest pain      Dizziness and giddiness      Chest pain, unspecified      Elevated blood-pressure reading, without diagnosis of hypertension      Jul 16, 2017 [place] [facility]: [facility] [place] [facility]      chest pain      Nonspecific low blood-pressure reading      Chest pain, unspecified      Palpitations      Jul 03, 2017 [place] [facility]: [facility] [place] [facility]      possible allergic reaction      Urticaria      Urticaria, unspecified      Jul 02, 2017 [place] [facility] [place] [facility]      chest pain      Chest pain, unspecified      Jun 05, 2017 [place] [facility]: [facility] [place] [facility]      Headache, throbbing pain all over, light headed, nauseated      Dizziness      Headache      Other headache syndrome      Hematemesis      Apr 03, 2017 [place] [facility]  Emergency      Chest pain, unspecified      Acquired absence of other specified parts of digestive tract          Recent Inpatient Visit Summary  No recorded inpatient visits.     Care Providers  There are no care providers on record at this time.   Collective Portal  This patient has registered at the [facility] Emergency Room: [facility] Emergency Department   For more information visit: [email or web address]  andnbsp PLEASE NOTE:    1.   Any care recommendations and other clinical information are provided as guidelines or for historical purposes only, and providers should exercise their own clinical judgment when providing care.    2.   You may only use this information for purposes of treatment, payment or health care operations activities, and subject to the limitations of applicable [employer].    3.   You should consult directly with the organization that provided a care guideline or other clinical history with any questions about additional information or accuracy or completeness of information provided.    ? 2019 [employer] - www.collectivemedical.com

## 2018-01-24 NOTE — ED Notes (Signed)
Pt to ct scan on stretcher

## 2018-01-24 NOTE — Consults (Signed)
NEUROLOGY CONSULTATION    Date Time: 01/24/18 11:56 AM  Patient Name: Amber [name] F  Attending Physician: Carmelina Paddock, MD      Assessment & Plan:   Episode of left face and arm numbness, currently resolved, likely transient ischemic attack  Headaches, ongoing for the last 1 week, with some migraine-like features, but with no prior history of headaches   MRI brain with and without contrast, MRA of the head and neck needs to be done.   Echo as part of transient ischemic attack workup, echo can be done as outpatient as well   Magnesium sulfate for headache.   Aspirin for transient ischemic attack.   ?  LP for headaches, given no prior history of headaches, UNLESSthey resolved with medication management   Check lipid panel.   Will follow    History of Present Illness:   The patient is a 67 year old lady, comes in to the hospital for left face and arm numbness, that started this morning when she woke up, her symptoms have currently resolved.  He also states that since last 1 week she has had headaches, ongoing on a constant basis, described as pressure and throbbing, in the vertex region, with some sound sensitivity,--->.  She does not have a prior history of headaches.  There is no report of any fevers, chills  ESR was normal   scan of the head was normal    Past Medical History:     Past Medical History:   Diagnosis Date   . Asthma    . Chest pain 2016     Chest pain after eating x6  months--being followed by GI for GERD   . Constipation    . Difficulty walking     amb with cane secondary to arthritis bil knees   . Genital herpes     no current outbreak (10/03/15)   . GERD (gastroesophageal reflux disease)    . Hiatal hernia     h/o surgery   . Hyperlipidemia    . Hypertensive disorder     Well controlled on med. Denies any SOB in last 6 months (10/03/15)   . Low back pain    . Morbid obesity with BMI of 40.0-44.9, adult     BMI 40.9   . Nausea without vomiting    . OSA on CPAP 2015    CPAP nightly x 1 yr    . Osteoarthritis of knees, bilateral     and back   . TIA (transient ischemic attack) 2014    TIA 2014-no residual. Patient evaulated for possible TIA 07/12/2015  @ [facility] (dischage summary in epic)-per summary CT of head was normal and patient was d/c'd       Meds:   Albuterol.  B complex, vitamin D, Valium, Flonase, Protonix, Valtrex, atenolol      Allergies   Allergen Reactions   . Acyclovir Nausea And Vomiting   . Amoxicillin-Pot Clavulanate      Other reaction(s): gi distress   . Aspirin Nausea And Vomiting     Other reaction(s): gi distress  Upsets stomach  Able to tolerate enteric coated aspirin   . Atorvastatin      Palpitation   . Lovastatin Nausea And Vomiting   . Metronidazole Nausea And Vomiting   . Moxifloxacin Nausea And Vomiting     GI symptoms   . Other Nausea And Vomiting   . Rosuvastatin      Palpitation, chest pain, abd pain    . Statins    .  Sulfa Antibiotics Nausea And Vomiting     Other reaction(s): gi distress       Social & Family History:     Social History     Social History   . Marital status: Single     Spouse name: N/A   . Number of children: 2   . Years of education: N/A     Occupational History   . legal instrument examiner [employer]      Social History Main Topics   . Smoking status: Never Smoker   . Smokeless tobacco: Never Used   . Alcohol use No   . Drug use: No   . Sexual activity: No     Other Topics Concern   . Dietary Supplements / Vitamins Yes   . Anesthesia Problems No   . Blood Thinners No   . Eats Large Amounts No   . Excessive Sweets No   . Skips Meals No   . Eats Excessive Starches Yes   . Snacks Or Grazes No   . Emotional Eater Yes   . Eats Fried Food Yes   . Eats Fast Food Yes   . Diet Center No   . Hmr No   . [name] No   . La Weight Loss No   . Nutri-System No   . Opti-Fast / Medi-Fast No   . Overeaters Anonymous No   . Phys[facility] Weight Loss Center No   . Tops No   . Weight Watchers No   . Atkins No   . Binging / Purging No   . Body For Life No   .  Cabbage Soup No   . Calorie Counting No   . Fasting No   . [name] No   . Health Spa No   . Herbal Life No   . High Protein No   . Low Carb No   . Low Fat No   . [facility] Diet No   . [place] Diet No   . Margie Billet Diet No   . [place] Diet No   . Slim Fast No   . [place] No   . Sugar Busters No   . Vomiting No   . Zone Diet No   . Stationary Cycle Or Treadmill No   . Gym/Fitness Classes No   . Home Exercise/Video No   . Swimming No   . Team Sports No   . Weight Training No   . Walking Or Running No   . Hospitalization No   . Hypnosis No   . Physical Therapy No   . Psychological Therapy No   . Residential Program No   . Acutrim No   . Amphetamines No   . Anorex No   . Byetta No   . Dexatrim No   . Didrex No   . Fastin No   . Fen - Phen No   . Ionamin / Adipex No   . Mazanor No   . Meridia No   . Obalan No   . Phendiet No   . Phentrol No   . Phenteramine Yes   . Plegine No   . Pondimin No   . Qsymia No   . Prozac No   . Redux No   . Sanorex No   . Tenuate No   . Tepanole No   . Wechless No   . Wellbutrin No   . Xenical (Orlistat, Alli) No   . Other Med  No   . No Impairment Yes     Chronic Bilat Knee Pain   . Walks With Cane/Crutch Yes   . Requires A Wheelchair No   . Bedridden No   . Are You Currently Being Treated For Depression? No   . Do You Snore? Yes   . Are You Receiving Any Medical Or Psychological Services? No   . Do You Have Or Have You Been Treated For An Eating Disorder? No   . Do You Exercise Regularly? No   . Have You Or Family Member Ever Have Trouble With Anesthesia? No     Social History Narrative   . No narrative on file     Family History   Problem Relation Age of Onset   . Cancer Mother 64        breast cancer   . Breast cancer Mother    . Heart disease Father    . Malignant hyperthermia Neg Hx    . Pseudochol deficiency Neg Hx            CODE STATUS: Full code   Review of Systems:   Currently, severe  headache, eye, ear nose, throat problems; no coughing or wheezing or  shortness of breath, No chest pain or orthopnea, no abdominal pain, nausea or vomiting, No pain in the body or extremities, no psychiatric, neurological, endocrine, hematological or cardiac complaints except as noted above.          Physical Exam:   Blood pressure 134/64, pulse (!) 52, temperature (!) 96.6 F (35.9 C), temperature source Oral, resp. rate 20, height 1.549 m (5\' 1" ), weight 60.1 kg (132 lb 7.9 oz), SpO2 99 %.    HEENT: Normocephalic.no carotid bruits  Lungs:  CTA bil  Abd Soft   Cardiac:  S1,S2, normal rate and rhythm  Neck: supple, no cartoid bruits  Extremities: no edema  Skin: no rashes seen in exposed areas     Neuro:  Level of consciousness:  Alert and appropriate  Oriented:  X 3  Cognition:  Intact naming, recognition, concentration and following complex commands  Cranial Nerves:  II-XII intact  Strength:  No upper extremity drift, 5/5 strength x 4 extremities  Coordination:  Intact FTN testing  Reflexes:  +2 throughout, down going toes bil  Sensation: Intact x 4 extremities to LT, temp, vibration  Gait:  Intact  No areas of reproducible tenderness of the head and neck area noticed.  No neck stiffness appreciated either    Labs:     Recent Labs  Lab 01/24/18  0715   Glucose 84   BUN 11.0   Creatinine 0.7   Calcium 9.2   Sodium 144   Potassium 3.5   Chloride 109   CO2 26   Albumin 4.0   AST (SGOT) 17   ALT 13   Bilirubin, Total 0.4   Alkaline Phosphatase 79       Recent Labs  Lab 01/24/18  0715   WBC 3.91   Hgb 12.5   Hematocrit 39.6   MCV 88.4   MCH 27.9   MCHC 31.6   Platelets 218         Recent Labs      01/24/18   0715   PTT  28   PT  13.6   PT INR  1.0          Radiology Results (24 Hour)     Procedure Component Value Units Date/Time    MRI Brain W  WO Contrast [[id number]] Updated:  01/24/18 1135    Order Status:  Sent     CT Head WO Contrast [[id number]] Collected:  01/24/18 0655    Order Status:  Completed Updated:  01/24/18 0701    Narrative:       CLINICAL  HISTORY:  STROKE    EXAMINATION:  CT HEAD WO CONTRAST    COMPARISON:  06/05/2017     TECHNIQUE:     contiguous axial CT images of the head are obtained.   Sagittal and coronal reconstructions  are performed.   A radiation dose optimization technique is used for this scan.    CONTRAST:  No contrast used.       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    There is no intracranial hemorrhage, mass effect or midline shift.    Ventricles, sulci, and cisterns are normal in size and contour for  patient's age.    Visualized mastoid: clear.    Visualized paranasal sinuses: clear.    Visualized orbits: within normal limits.    Bone: within normal limits.    Subcutaneous soft tissues:  within normal limits.             Impression:          No acute intracranial findings or substantial change.       Nicki Reaper, MD   01/24/2018 6:57 AM    Chest AP Portable [[id number]] Collected:  01/24/18 0647    Order Status:  Completed Updated:  01/24/18 0652    Narrative:       CLINICAL HISTORY:  chest pain    EXAMINATION:  XR CHEST AP PORTABLE    COMPARISON:  11/04/2017     FINDINGS:        Lungs: clear      Pleural space: Within normal limits.    Mediastinum: Within normal limits.    Bones: Fusion lower over lower cervical spine are again noted.    Soft tissues: Within normal limits.          Impression:         No acute findings or substantial change.    Nicki Reaper, MD   01/24/2018 6:48 AM           All recent brain and spine imaging (MRI, CT) personally reviewed.    Chart reviewed    Case discussed with: pt and ed `      This note was generated by the Epic EMR system/Speech recognition and may contain inherent errors or omissions not intended by the user. Grammatical errors, random word insertions, deletions and pronoun errors  are occasional consequences of this technology due to software limitations.   Not all errors are caught or corrected. If there are questions or concerns  about the content of this note or information contained within the body of this dictation they should be addressed directly with the author for clarification.    Signed by: Cathe Mons, MD  Spectralink: [phone]      Answering Service: [phone]

## 2018-01-24 NOTE — OT Eval Note (Signed)
[facility]  [address]  [place], [place] [address]  [phone]    Occupational Therapy Evaluation    Patient: Amber [name] MRN: [id number]   Unit: [facility] INTERMEDIATE CARE Bed: MI621/MI621-02    Time of treatment: Time Calculation  OT Received On: 01/24/18  Start Time: 1515  Stop Time: 1530  Time Calculation (min): 15 min    Consult received for Amber [name] for OT evaluation and treatment.  Patient's medical condition is appropriate for Occupational Therapy  intervention at this time.    D/C Suggestions   Home, no OT needs  Transport Recommendations: no limitations    Assessment     Amber [name] is a very pleasant and cooperative 67 y.o. female admitted 01/24/2018.  Pt demonstrates ability to ambulate and perform self care tasks independently.  B UE WFL for strength, coordination, ROM and sensation.  No cognitive deficits noted.  Pt will c/o 10/10 headache, RN notified. No further OT indicated at this time.       Complexity Chart Review Performance Deficits Clinical Decision Making Hx/Comorbidities Assistance needed   Low  Brief 1-3 Limited options None None (or at baseline)     PMP - Progressive Mobility Protocol   PMP Activity: Step 6 - Walks in Room  Distance Walked (ft) (Step 6,7): 40 Feet     Interdisciplinary Communication: discussed with PT/RN    Plan     OT Plan  Risks/Benefits/POC Discussed with Pt/Family: With patient  Treatment Interventions: No skilled interventions needed at this time  Discharge Recommendation: Home with no needs  OT Frequency Recommended: one time visit         Medical Diagnosis: Palpitations [R00.2]  Left sided numbness [R20.0]  Chest pain, unspecified type [R07.9]    History of Present Illness: Amber [name] is a 67 y.o. female admitted on  01/24/2018 with " left face and arm numbness, that started this morning when she woke up, her symptoms have currently resolved.  He also states that since last 1 week she has had headaches, ongoing on a constant basis,  described as pressure and throbbing, in the vertex region, with some sound sensitivity,--->.  She does not have a prior history of headaches.  There is no report of any fevers, chills  ESR was normal   scan of the head was normal"      Patient Active Problem List   Diagnosis   . Essential hypertension   . Anxiety state, unspecified   . Abnormal electrocardiogram   . Cerebral infarction   . Gastroesophageal reflux disease   . Hypernatremia   . Paresthesia   . Slow transit constipation   . Vitamin D deficiency   . Overactive bladder   . S/P laparoscopic cholecystectomy   . Transient cerebral ischemia, unspecified type   . Palpitations   . Pituitary microadenoma   . Dyslipidemia   . Other long term (current) drug therapy   . Headache   . History of spinal surgery   . Obstructive sleep apnea syndrome   . Osteoarthritis of knees, bilateral   . Pre-op evaluation   . Bariatric surgery status   . Nutritional deficiency   . Anxiety   . Epigastric abdominal tenderness without rebound tenderness   . Other chest pain   . Abdominal pain   . Chest pain     Past Medical History:   Diagnosis Date   . Asthma    . Chest pain 2016  Chest pain after eating x6  months--being followed by GI for GERD   . Constipation    . Difficulty walking     amb with cane secondary to arthritis bil knees   . Genital herpes     no current outbreak (10/03/15)   . GERD (gastroesophageal reflux disease)    . Hiatal hernia     h/o surgery   . Hyperlipidemia    . Hypertensive disorder     Well controlled on med. Denies any SOB in last 6 months (10/03/15)   . Low back pain    . Morbid obesity with BMI of 40.0-44.9, adult     BMI 40.9   . Nausea without vomiting    . OSA on CPAP 2015    CPAP nightly x 1 yr   . Osteoarthritis of knees, bilateral     and back   . TIA (transient ischemic attack) 2014    TIA 2014-no residual. Patient evaulated for possible TIA 07/12/2015  @ [facility] (dischage summary in epic)-per summary CT of head was normal and patient was d/c'd      Past Surgical History:   Procedure Laterality Date   . APPENDECTOMY  >25 yrs   . CHOLECYSTECTOMY     . EGD  01/2015   . EGD, BIOPSY N/A 09/15/2017    Procedure: EGD, BIOPSY;  Surgeon: Pershing Proud, MD;  Location: [facility];  Service: Gastroenterology;  Laterality: N/A;   . fallopian tube surgery  >25 yrs   . INSERTION, PAIN PUMP (MEDICAL) N/A 10/22/2015    Procedure: INSERTION, PAIN PUMP (MEDICAL);  Surgeon: Nicola Police, DO;  Location: [facility];  Service: General;  Laterality: N/A;   . LAPAROSCOPIC, CHOLECYSTECTOMY, CHOLANGIOGRAM  08/13/2014   . LAPAROSCOPIC, GASTRIC BYPASS N/A 10/22/2015    Procedure: LAPAROSCOPIC, GASTRIC BYPASS;  Surgeon: Josefa Half R, DO;  Location: [place] [facility];  Service: General;  Laterality: N/A;   . LAPAROSCOPIC, HERNIORRHAPHY, HIATAL N/A 10/22/2015    Procedure: LAPAROSCOPIC, HERNIORRHAPHY, HIATAL;  Surgeon: Jeanell Sparrow, Hamid R, DO;  Location: [facility];  Service: General;  Laterality: N/A;   . OVARY SURGERY Right >25 yrs   . SPINE SURGERY  11/2013    cervical HNP x 2, Dr. Bufford Buttner         X-Rays/Tests/Labs:  Lab Results   Component Value Date/Time    HGB 12.5 01/24/2018 07:15 AM    HGB 12.5 11/10/2017 07:40 AM    HCT 39.6 01/24/2018 07:15 AM    K 3.5 01/24/2018 07:15 AM    NA 144 01/24/2018 07:15 AM    INR 1.0 01/24/2018 07:15 AM    TROPI <0.01 01/24/2018 07:15 AM    TROPI <0.01 11/04/2017 10:02 AM       Social History:  Lives with her son in a house.  Entry Steps: 1   Rails: no Inside steps: 2 to kitchen, one flight to bedroom  Rails: yes  Equipment at home:  walk in shower  Prior Level of Function:  Drives, shops, independent with IADL's, retired    Cognition: intact    Mobility: independent   Feeding: independent   Grooming: independent   Bathing: independent   Dressing: independent   Toileting: independent     Subjective     Patient is agreeable to participation in the therapy session. Nursing clears patient for therapy.  Patient's Goal:   Decreased pain  Pain: 10/10  Location: headache  Therapist Intervention: positioned for comfort and  RN notified of pt's request for pain medication  Patient is satisfied with therapist intervention.    Objective     Precautions:   Precautions  Weight Bearing Status: no restrictions    Patient is in bed with  Telemetry and Intravenous (IV)  in place.       Observation of patient/vitals:   Vitals:    01/24/18 0802 01/24/18 1003 01/24/18 1023 01/24/18 1305   BP: 134/73 134/64  144/65   Pulse: 62 (!) 52  (!) 59   Resp: 18 22 20 20    Temp: 98 F (36.7 C) (!) 96.6 F (35.9 C)  (!) 96.8 F (36 C)   TempSrc: Oral Oral  Oral   SpO2: 100% 99%  99%   Weight:  60.1 kg (132 lb 7.9 oz)     Height:           Orientation/Cognition:     Alert and Oriented x 4  Cognition: intact      Musculoskeletal Examination:   ROM Strength   RUE WFL 5/5   LUE WFL 5/5     Sensation: Intact to light touch, localization, proprioception throughout B UE's.   Coordination: Intact gross motor and serial opposition to B hands    Vision: WFL  Hearing: WFL      Functional Mobility:  Rolling: independent    Supine to sit: independent  Scooting: independent  Sit to Supine: independent  Sit to stand: independent  Stand to sit: independent  Transfers: independent  Ambulates in room independently    Balance:  Static Sit Balance: good  Dynamic Sit Balance: good  Static Stand Balance: good  Dynamic Stand Balance: good    Self Care:  Eating: independent  Grooming: independent  Bathing: independent  UB Dressing: independent with gown  LB Dressing: independent with socks  Toileting: independent using toilet in bathroom      Endurance: limited due to headache    Participation:  good    Education:  Educated the patient to role of occupational therapy, plan of care, goals  of therapy and safety with mobility and ADLs.    RN notified of session outcome and that patient was left in bed with all needs met and equipment intact.   Safety measures include: handoff to  nurse/clin tech/ unit secretary completed, bed alarm activated, oriented to call bell and placed within reach, personal items within reach and bed placed in lowest position.   Mobility and ADL status posted at bedside and within E.M.R.          Goals:  Goals  Goal Formulation: Patient (No goals established)      Signature:   Rosalia Hammers, OT  01/24/2018  3:30 PM  Phone: [phone]

## 2018-01-24 NOTE — Progress Notes (Signed)
Pt received from [facility], oriented to room and call bel system, verbalizes understanding. Pt A&O X 4, SB on tele, VSS.

## 2018-01-24 NOTE — ED Notes (Signed)
ecg done and given to Dr.Orogbemi

## 2018-01-24 NOTE — Consults (Signed)
Cardiology Consult note  Cardiology Consult    Date/Time:  01/24/2018 1:11 PM  Patient Name:  Amber [name]:  November 17, 1950  MRN:  [id number]  Room#: MI621/MI621-02    Chief Complaint:    Chief Complaint   Patient presents with   . Chest Pain       Reason For Consult:   Chest pain per Dr. Rubin Payor    HPI :   67 yo presents with high BP and HAs. DBP elevated to 100s and 90s yesterday. Intermittent palpitations described as heart pounding worse with high BP. Also with chest pain yesterday described as sharp and lasting for seconds at a time.       PMHx:     Past Medical History:   Diagnosis Date   . Asthma    . Chest pain 2016     Chest pain after eating x6  months--being followed by GI for GERD   . Constipation    . Difficulty walking     amb with cane secondary to arthritis bil knees   . Genital herpes     no current outbreak (10/03/15)   . GERD (gastroesophageal reflux disease)    . Hiatal hernia     h/o surgery   . Hyperlipidemia    . Hypertensive disorder     Well controlled on med. Denies any SOB in last 6 months (10/03/15)   . Low back pain    . Morbid obesity with BMI of 40.0-44.9, adult     BMI 40.9   . Nausea without vomiting    . OSA on CPAP 2015    CPAP nightly x 1 yr   . Osteoarthritis of knees, bilateral     and back   . TIA (transient ischemic attack) 2014    TIA 2014-no residual. Patient evaulated for possible TIA 07/12/2015  @ [facility] (dischage summary in epic)-per summary CT of head was normal and patient was d/c'd       Allergies:     Allergies   Allergen Reactions   . Acyclovir Nausea And Vomiting   . Amoxicillin-Pot Clavulanate      Other reaction(s): gi distress   . Aspirin Nausea And Vomiting     Other reaction(s): gi distress  Upsets stomach  Able to tolerate enteric coated aspirin   . Atorvastatin      Palpitation   . Lovastatin Nausea And Vomiting   . Metronidazole Nausea And Vomiting   . Moxifloxacin Nausea And Vomiting     GI symptoms   . Other Nausea And Vomiting   . Rosuvastatin       Palpitation, chest pain, abd pain    . Statins    . Sulfa Antibiotics Nausea And Vomiting     Other reaction(s): gi distress       Home Medications:     No current facility-administered medications on file prior to encounter.      Current Outpatient Prescriptions on File Prior to Encounter   Medication Sig Dispense Refill   . albuterol (PROVENTIL HFA;VENTOLIN HFA) 108 (90 BASE) MCG/ACT inhaler Inhale into the lungs every 4 (four) hours as needed.        . B Complex Vitamins (VITAMIN-B COMPLEX) Tab Take  by Mouth Once in the morning     . cholecalciferol (VITAMIN D-1000 MAX ST) 1000 units tablet Take 1,000 Units by Mouth Once a Day at lunch time     . diazePAM (VALIUM) 2 MG tablet Take 1 tablet (2  mg total) by mouth every 8 (eight) hours as needed for Anxiety. 30 tablet 2   . fluticasone (FLONASE) 50 MCG/ACT nasal spray 2 sprays by Nasal route daily. (Patient taking differently: 2 sprays by Nasal route as needed.   ) 16 g 2   . lidocaine (LIDODERM) 5 % Place 1 patch onto the skin daily.Remove & Discard patch within 12 hours or as directed by MD 15 each 0   . pantoprazole (PROTONIX) 40 MG tablet TAKE 1 TABLET(40 MG) BY MOUTH DAILY (Patient taking differently: TAKE 1 TABLET(40 MG) BY MOUTH IN THE MORNING) 90 tablet 0   . valACYclovir HCL (VALTREX) 500 MG tablet TAKE 1 TABLET(500 MG) BY MOUTH DAILY (Patient taking differently: TAKE 1 TABLET(500 MG) BY MOUTH DAILY PRN) 90 tablet 0   . atenolol (TENORMIN) 25 MG tablet Take 1 tablet (25 mg total) by mouth daily. (Patient taking differently: Take 100 mg by mouth every morning.    ) 90 tablet 3   . IRON PO Take 29 mg by mouth every morning.         . [DISCONTINUED] ondansetron (ZOFRAN-ODT) 4 MG disintegrating tablet Take 1 tablet (4 mg total) by mouth every 6 (six) hours as needed for Nausea. 10 tablet 0         SoHx:     Social History   Substance Use Topics   . Smoking status: Never Smoker   . Smokeless tobacco: Never Used   . Alcohol use No       Surgical Hx:     Past  Surgical History:   Procedure Laterality Date   . APPENDECTOMY  >25 yrs   . CHOLECYSTECTOMY     . EGD  01/2015   . EGD, BIOPSY N/A 09/15/2017    Procedure: EGD, BIOPSY;  Surgeon: Pershing Proud, MD;  Location: [facility];  Service: Gastroenterology;  Laterality: N/A;   . fallopian tube surgery  >25 yrs   . INSERTION, PAIN PUMP (MEDICAL) N/A 10/22/2015    Procedure: INSERTION, PAIN PUMP (MEDICAL);  Surgeon: Nicola Police, DO;  Location: [facility];  Service: General;  Laterality: N/A;   . LAPAROSCOPIC, CHOLECYSTECTOMY, CHOLANGIOGRAM  08/13/2014   . LAPAROSCOPIC, GASTRIC BYPASS N/A 10/22/2015    Procedure: LAPAROSCOPIC, GASTRIC BYPASS;  Surgeon: Josefa Half R, DO;  Location: [facility];  Service: General;  Laterality: N/A;   . LAPAROSCOPIC, HERNIORRHAPHY, HIATAL N/A 10/22/2015    Procedure: LAPAROSCOPIC, HERNIORRHAPHY, HIATAL;  Surgeon: Jeanell Sparrow, Hamid R, DO;  Location: [facility];  Service: General;  Laterality: N/A;   . OVARY SURGERY Right >25 yrs   . SPINE SURGERY  11/2013    cervical HNP x 2, Dr. Bufford Buttner       Family Hx:     Family History   Problem Relation Age of Onset   . Cancer Mother 46        breast cancer   . Breast cancer Mother    . Heart disease Father    . Malignant hyperthermia Neg Hx    . Pseudochol deficiency Neg Hx        Review of Systems:   Constitutional: Negative for fevers and chills  Skin: No rash or lesions  Respiratory: Negative for hemoptysis  Cardiovascular: as per HPI  Gastrointestinal: Negative for hematochezia  Musculoskeletal:  Neck pain  Genitourinary: Negative for hematuria  Neurologic: No headaches  Otherwise 10 point review of systems is negative.      Physical Exam:  BP 144/65   Pulse (!) 59   Temp (!) 96.8 F (36 C) (Oral)   Resp 20   Ht 1.549 m (5\' 1" )   Wt 60.1 kg (132 lb 7.9 oz)   SpO2 99%   BMI 25.03 kg/m   99% SpO2 on              Constitutional:      Alert, cooperative, well developed, well nourished.  No acute distress.    Skin:     Warm and dry to touch.  No apparent rashes or bruising.   Head/Eyes :    Normocephalic.  EOM intact.  Normal conjunctivae and lids.       Neck:   Carotid pulses full and equal bilaterally.  No carotid bruit. JVP to 5 cm.   Lungs:     No tenderness to palpation.  Normal respiratory effort.  Clear to auscultation bilaterally.  No rhonchi, rales, or wheezes.     Cardiac:   LV apical impulse not displaced.  Regular rhythm.   S1 and   S2 normal.  No murmur, rub, or gallop   Abdomen:     Soft, non-tender.  Bowel sounds present.  No bruits.   Extremities:   No clubbing, cyanosis, or edema.   Pulses:   Distal pulses are full and equal bilaterally.     Neurologic:   No gross motor or sensory deficits.  Alert O x 3.   Psychiatric:   Normal mood and affect.     Labs:     Admission on 01/24/2018   Component Date Value Ref Range Status   . Glucose 01/24/2018 84  70 - 100 mg/dL Final   . BUN 16/08/9603 11.0  7.0 - 19.0 mg/dL Final   . Creatinine 54/07/8118 0.7  0.6 - 1.0 mg/dL Final   . Sodium 14/78/2956 144  136 - 145 mEq/L Final   . Potassium 01/24/2018 3.5  3.5 - 5.1 mEq/L Final   . Chloride 01/24/2018 109  100 - 111 mEq/L Final   . CO2 01/24/2018 26  22 - 29 mEq/L Final   . Calcium 01/24/2018 9.2  8.5 - 10.5 mg/dL Final   . Protein, Total 01/24/2018 6.8  6.0 - 8.3 g/dL Final   . Albumin 21/30/8657 4.0  3.5 - 5.0 g/dL Final   . AST (SGOT) 84/69/6295 17  5 - 34 U/L Final   . ALT 01/24/2018 13  0 - 55 U/L Final   . Alkaline Phosphatase 01/24/2018 79  37 - 106 U/L Final   . Bilirubin, Total 01/24/2018 0.4  0.2 - 1.2 mg/dL Final   . Globulin 28/41/3244 2.8  2.0 - 3.6 g/dL Final   . Albumin/Globulin Ratio 01/24/2018 1.4  0.9 - 2.2 Final   . Anion Gap 01/24/2018 9.0  5.0 - 15.0 Final   . WBC 01/24/2018 3.91  3.10 - 9.50 x10 3/uL Final   . Hgb 01/24/2018 12.5  11.4 - 14.8 g/dL Final   . Hematocrit 11/04/7251 39.6  34.7 - 43.7 % Final   . Platelets 01/24/2018 218  142 - 346 x10 3/uL Final   . RBC 01/24/2018 4.48  3.90 -  5.10 x10 6/uL Final   . MCV 01/24/2018 88.4  78.0 - 96.0 fL Final   . MCH 01/24/2018 27.9  25.1 - 33.5 pg Final   . MCHC 01/24/2018 31.6  31.5 - 35.8 g/dL Final   . RDW 66/44/0347 15  11 - 15 % Final   .  MPV 01/24/2018 10.0  8.9 - 12.5 fL Final   . Neutrophils 01/24/2018 46.5  None % Final   . Lymphocytes Automated 01/24/2018 40.2  None % Final   . Monocytes 01/24/2018 10.2  None % Final   . Eosinophils Automated 01/24/2018 2.3  None % Final   . Basophils Automated 01/24/2018 0.8  None % Final   . Immature Granulocyte 01/24/2018 0.0  None % Final   . Nucleated RBC 01/24/2018 0.0  0.0 - 0.0 /100 WBC Final   . Neutrophils Absolute 01/24/2018 1.82  1.10 - 6.33 x10 3/uL Final   . Abs Lymph Automated 01/24/2018 1.57  0.42 - 3.22 x10 3/uL Final   . Abs Mono Automated 01/24/2018 0.40  0.21 - 0.85 x10 3/uL Final   . Abs Eos Automated 01/24/2018 0.09  0.00 - 0.44 x10 3/uL Final   . Absolute Baso Automated 01/24/2018 0.03  0.00 - 0.08 x10 3/uL Final   . Absolute Immature Granulocyte 01/24/2018 0.00  0.00 - 0.07 x10 3/uL Final   . Absolute NRBC 01/24/2018 0.00  0.00 - 0.00 x10 3/uL Final   . Troponin I 01/24/2018 <0.01  0.00 - 0.09 ng/mL Final   . D-Dimer 01/24/2018 0.28  0.00 - 0.70 ug/mL FEU Final   . EGFR 01/24/2018 >60.0   Final   . PT 01/24/2018 13.6  12.6 - 15.0 sec Final   . PT INR 01/24/2018 1.0  0.9 - 1.1 Final   . PT Anticoag. Given Within 48 hrs. 01/24/2018 None   Final   . PTT 01/24/2018 28  23 - 37 sec Final   . Sed Rate 01/24/2018 12  0 - 20 mm/Hr Final   . POCT - Glucose Whole blood 01/24/2018 78  70 - 100 mg/dL Final       Radiology:     ECG independently reviewed by me shows NSR, dRWP, NS T wave changes    Assessment/Plan:     Amber [name] is a 67 y.o. female with h/o HTN presents with HA and elevated BP. No evidence of ACS by ECG or enzymes. No evidence of CAD on cardiac cath in 2015 and no ischemia on recent nuclear stress test. Chest pain is atypical and would not recommend further ischemic testing.  Monitor on telemetry given palpitations; recent event monitor without significant arrhythmia. BP elevated at home and change carvedilol to atenolol.    Thank you for the consultation. Please feel free to contact me with any questions that may arise regarding this visit.

## 2018-01-24 NOTE — ED Triage Notes (Signed)
Pt stated that she is having chest pain x 4 days and hypertension x 1 week with neck pain

## 2018-01-24 NOTE — ED Provider Notes (Signed)
Physician/Midlevel provider first contact with patient: 01/24/18 2595         History     Chief Complaint   Patient presents with   . Chest Pain     Amber [name] presents with multiple complaints that have been ongoing for a week.  Her symptoms started a week ago with a left sided headache and heart palpitations.  She describes both sensations as constant, although she states that she has pulse rates in the 70s and 80s when she checks her blood pressure.  She then reports a progression of symptoms to include chest pain that started 3 days ago.  The chest pain is left sided, does not radiate, has no alleviating or exacerbating factors.  No associated dyspnea.  The main impetus for coming to the ER today was progression of symptoms to include numbness to her left face and arm that she noticed when she woke up this morning at 5 am.  She denies weakness.  Last well known without numbness was 2200 yesterday.                  Past Medical History:   Diagnosis Date   . Asthma    . Chest pain 2016     Chest pain after eating x6  months--being followed by GI for GERD   . Constipation    . Difficulty walking     amb with cane secondary to arthritis bil knees   . Genital herpes     no current outbreak (10/03/15)   . GERD (gastroesophageal reflux disease)    . Hiatal hernia     h/o surgery   . Hyperlipidemia    . Hypertensive disorder     Well controlled on med. Denies any SOB in last 6 months (10/03/15)   . Low back pain    . Morbid obesity with BMI of 40.0-44.9, adult     BMI 40.9   . Nausea without vomiting    . OSA on CPAP 2015    CPAP nightly x 1 yr   . Osteoarthritis of knees, bilateral     and back   . TIA (transient ischemic attack) 2014    TIA 2014-no residual. Patient evaulated for possible TIA 07/12/2015  @ [facility] (dischage summary in epic)-per summary CT of head was normal and patient was d/c'd       Past Surgical History:   Procedure Laterality Date   . APPENDECTOMY  >25 yrs   . CHOLECYSTECTOMY     . EGD  01/2015   .  EGD, BIOPSY N/A 09/15/2017    Procedure: EGD, BIOPSY;  Surgeon: Pershing Proud, MD;  Location: [facility];  Service: Gastroenterology;  Laterality: N/A;   . fallopian tube surgery  >25 yrs   . INSERTION, PAIN PUMP (MEDICAL) N/A 10/22/2015    Procedure: INSERTION, PAIN PUMP (MEDICAL);  Surgeon: Nicola Police, DO;  Location: [facility];  Service: General;  Laterality: N/A;   . LAPAROSCOPIC, CHOLECYSTECTOMY, CHOLANGIOGRAM  08/13/2014   . LAPAROSCOPIC, GASTRIC BYPASS N/A 10/22/2015    Procedure: LAPAROSCOPIC, GASTRIC BYPASS;  Surgeon: Josefa Half R, DO;  Location: [facility];  Service: General;  Laterality: N/A;   . LAPAROSCOPIC, HERNIORRHAPHY, HIATAL N/A 10/22/2015    Procedure: LAPAROSCOPIC, HERNIORRHAPHY, HIATAL;  Surgeon: Jeanell Sparrow, Hamid R, DO;  Location: [facility];  Service: General;  Laterality: N/A;   . OVARY SURGERY Right >25 yrs   . SPINE SURGERY  11/2013  cervical HNP x 2, Dr. Bufford Buttner       Family History   Problem Relation Age of Onset   . Cancer Mother 45        breast cancer   . Breast cancer Mother    . Heart disease Father    . Malignant hyperthermia Neg Hx    . Pseudochol deficiency Neg Hx        Social  Social History   Substance Use Topics   . Smoking status: Never Smoker   . Smokeless tobacco: Never Used   . Alcohol use No       .     Allergies   Allergen Reactions   . Acyclovir Nausea And Vomiting   . Amoxicillin-Pot Clavulanate      Other reaction(s): gi distress   . Aspirin Nausea And Vomiting     Other reaction(s): gi distress  Upsets stomach  Able to tolerate enteric coated aspirin   . Atorvastatin      Palpitation   . Lovastatin Nausea And Vomiting   . Metronidazole Nausea And Vomiting   . Moxifloxacin Nausea And Vomiting     GI symptoms   . Other Nausea And Vomiting   . Rosuvastatin      Palpitation, chest pain, abd pain    . Statins    . Sulfa Antibiotics Nausea And Vomiting     Other reaction(s): gi distress       Home Medications              albuterol (PROVENTIL HFA;VENTOLIN HFA) 108 (90 BASE) MCG/ACT inhaler     Inhale into the lungs every 4 (four) hours as needed.        atenolol (TENORMIN) 25 MG tablet     Take 1 tablet (25 mg total) by mouth daily.     B Complex Vitamins (VITAMIN-B COMPLEX) Tab     Take  by Mouth Once a Day.     cholecalciferol (VITAMIN D-1000 MAX ST) 1000 units tablet     Take 1,000 Units by Mouth Once a Day.     diazePAM (VALIUM) 2 MG tablet     Take 1 tablet (2 mg total) by mouth every 8 (eight) hours as needed for Anxiety.     fluticasone (FLONASE) 50 MCG/ACT nasal spray     2 sprays by Nasal route daily.     Patient taking differently:  2 sprays by Nasal route as needed.        IRON PO     Take 29 mg by mouth daily.     lidocaine (LIDODERM) 5 %     Place 1 patch onto the skin daily.Remove & Discard patch within 12 hours or as directed by MD     ondansetron (ZOFRAN-ODT) 4 MG disintegrating tablet     Take 1 tablet (4 mg total) by mouth every 6 (six) hours as needed for Nausea.     pantoprazole (PROTONIX) 40 MG tablet     TAKE 1 TABLET(40 MG) BY MOUTH DAILY     valACYclovir HCL (VALTREX) 500 MG tablet     TAKE 1 TABLET(500 MG) BY MOUTH DAILY           Review of Systems   Constitutional: Negative for chills and fever.   HENT: Negative for trouble swallowing.    Respiratory: Negative for shortness of breath.    Cardiovascular: Positive for chest pain and palpitations. Negative for leg swelling.   Gastrointestinal: Positive for nausea. Negative for  abdominal pain.   Genitourinary: Negative for dysuria.   Musculoskeletal: Negative for arthralgias, back pain and neck pain.   Neurological: Positive for numbness and headaches. Negative for weakness.   All other systems reviewed and are negative.      Physical Exam    BP: 156/81, Heart Rate: 67, Temp: 98.3 F (36.8 C), Resp Rate: 18, SpO2: 100 %, Weight: 60 kg    Physical Exam   Constitutional: She is oriented to person, place, and time. She appears well-developed and well-nourished.    HENT:   Head: Normocephalic and atraumatic.   Eyes: Pupils are equal, round, and reactive to light. EOM are normal.   Neck: Normal range of motion. Neck supple. No JVD present.   Cardiovascular: Normal rate and regular rhythm.    No murmur heard.  Pulmonary/Chest: Effort normal and breath sounds normal. No respiratory distress. She has no wheezes. She has no rales. She exhibits no tenderness.   Abdominal: Soft. Bowel sounds are normal. She exhibits no distension. There is no tenderness.   Musculoskeletal: She exhibits no edema.   Neurological: She is alert and oriented to person, place, and time. A sensory deficit ( subjective decrease with light sensation to left face, LUE, LLE.) is present. No cranial nerve deficit.   Skin: Skin is warm and dry.   Psychiatric: She has a normal mood and affect.   Nursing note and vitals reviewed.        MDM and ED Course     ED Medication Orders     None         EKG:  NSR @ 70 bpm, no ST changes, LVH.  No change compared to 11/04/17      MDM  Number of Diagnoses or Management Options  Chest pain, unspecified type:   Left sided numbness:   Palpitations:   Diagnosis management comments: Multiple complaints, but concern for stroke symptoms with unilateral numbness today.  STAT CT, sed rate, cardiac workup including d-dimer.  Will likely need admission for MRI and neuro consult.      Radiology Results (24 Hour)     Procedure Component Value Units Date/Time    CT Head WO Contrast [[id number]] Collected:  01/24/18 0655    Order Status:  Completed Updated:  01/24/18 0701    Narrative:       CLINICAL HISTORY:  STROKE    EXAMINATION:  CT HEAD WO CONTRAST    COMPARISON:  06/05/2017     TECHNIQUE:     contiguous axial CT images of the head are obtained.   Sagittal and coronal reconstructions  are performed.   A radiation dose optimization technique is used for this scan.    CONTRAST:  No contrast used.       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    There is no intracranial hemorrhage, mass effect or midline shift.    Ventricles, sulci, and cisterns are normal in size and contour for  patient's age.    Visualized mastoid: clear.    Visualized paranasal sinuses: clear.    Visualized orbits: within normal limits.    Bone: within normal limits.    Subcutaneous soft tissues:  within normal limits.             Impression:          No acute intracranial findings or substantial change.       Nicki Reaper, MD  01/24/2018 6:57 AM    Chest AP Portable [[id number]] Collected:  01/24/18 0647    Order Status:  Completed Updated:  01/24/18 0652    Narrative:       CLINICAL HISTORY:  chest pain    EXAMINATION:  XR CHEST AP PORTABLE    COMPARISON:  11/04/2017     FINDINGS:        Lungs: clear      Pleural space: Within normal limits.    Mediastinum: Within normal limits.    Bones: Fusion lower over lower cervical spine are again noted.    Soft tissues: Within normal limits.          Impression:         No acute findings or substantial change.    Nicki Reaper, MD   01/24/2018 6:48 AM                       Procedures    Clinical Impression & Disposition     Clinical Impression  Final diagnoses:   Chest pain, unspecified type   Palpitations   Left sided numbness        ED Disposition     None           New Prescriptions    No medications on file        Sign out to Dr. Rubin Payor pending labs. Will need admission.          Genia Hotter, MD  01/24/18 [phone]

## 2018-01-24 NOTE — Plan of Care (Signed)
Problem: Chest Pain  Goal: Vital signs and cardiac rhythm stable  Outcome: Progressing   01/24/18 1628   Goal/Interventions addressed this shift   Vital signs and cardiac rhythm stable Monitor Earnest Bailey vital signs/cardiac rhythms;Monitor labs;Assess the need for oxygen therapy and administer as ordered   VSS, SR on tele monitor  Goal: Cardiac pain management  Outcome: Progressing   01/24/18 1628   Goal/Interventions addressed this shift   Cardiac pain management  Assess/report chest pain/or related discomfort to LIP immediately;Instruct patient to report any change in pain status;Assess pain/or related discomfort on admission, during daily assessment, before and after any intervention;Include patient and patient care companion in decisions related to pain management   Denies chest pain, NAD noted.  Goal: Anxiety management/effective coping  Outcome: Progressing   01/24/18 1628   Goal/Interventions addressed this shift   Anxiety management/effective coping  Assess/report uncontrolled anxiety, depression or ineffective coping to LIP;Offer reassurance to decrease anxiety;Encourage patient to immediately report any increase in anxiety and/or depression;Include patient in decision making of their care and give updates on their health status       Problem: Day of Admission - Stroke  Goal: Core/Quality measure requirements - Admission  Outcome: Progressing   01/24/18 1628   Goal/Interventions addressed this shift   Core/Quality measure requirements - Admission Document NIH Stroke Scale on admission;Document nursing swallow/dysphagia screen on admission. If patient fails, keep patient NPO (follow your hospital protocol on swallowing screening).;VTE Prevention: Ensure anticoagulant(s) administered and/or anti-embolism stockings/devices documented as ordered;Ensure antithrombotic administered or contraindication documented by LIP;Ensure lipid panel ordered;Begin stroke education on admission (must include Modifiable Risk  Factors, Warning Signs and Symptoms of Stroke, Activation of Emergency Medical System and Follow-up Appointments) Ensure handout has been given and documented.;Ensure PT/OT and/or SLP ordered

## 2018-01-24 NOTE — ED Notes (Signed)
bgood glucose of 78mg /dl

## 2018-01-24 NOTE — SLP Eval Note (Signed)
Amber [name]  [address]  [place], [place] [address]  [phone]    SPEECH LANGUAGE COGNITIVE SWALLOW EVALUATION    Patient: Amber [name]    MRN#: [id number]    Date/Time of Evaluation:   Time Calculation  SLP Received On: 01/24/18  Start Time: 1145  Stop Time: 1215  Time Calculation (min): 30 min  Total Treatment Time (min): 30    Consult received for Amber [name] for SLP Evaluation and Treatment.    Referring Physician: Dr. Milagros Evener    Date of Referral: 01/24/2018      Assessment and Clinical Impression   Amber [name] is a 67 y.o. female admitted 01/24/2018 for  Palpitations [R00.2]  Left sided numbness [R20.0]  Chest pain, unspecified type [R07.9] presenting with speech, language and cognition WFL. Pt exhibits adequate communication, processing, word retrieval, organization, problem solving and memory. Pt reports no changes or problems with above. Swallow is [facility] for thin liquids, purees and solids. Initiation of swallow appears timely and laryngeal elevation is present to palpation. No cough, change in vocal quality or other s/s of aspiration observed. Mastication is effective. Given findings on bedside swallow evaluation and stable respiratory status, feel pt is safe to initiate a regular diet with thin liquids.     Indication for instrumental assessment of swallow: n/a            Plan   Patient does not need SLP follow-up.  Regular diet with thin liquids.      Interdisciplinary Communication: Discussed with RN and Dr. Francesco Sor       Nursing Dysphagia Screen: pass    Medical Diagnosis: Palpitations [R00.2]  Left sided numbness [R20.0]  Chest pain, unspecified type [R07.9]    History of Present Illness:   Amber [name] is a 67 y.o. female admitted on  01/24/2018 with Palpitations [R00.2]  Left sided numbness [R20.0]  Chest pain, unspecified type [R07.9]    Past Medical/Surgical History:  Past Medical History:   Diagnosis Date   . Asthma    . Chest pain 2016     Chest pain after eating x6   months--being followed by GI for GERD   . Constipation    . Difficulty walking     amb with cane secondary to arthritis bil knees   . Genital herpes     no current outbreak (10/03/15)   . GERD (gastroesophageal reflux disease)    . Hiatal hernia     h/o surgery   . Hyperlipidemia    . Hypertensive disorder     Well controlled on med. Denies any SOB in last 6 months (10/03/15)   . Low back pain    . Morbid obesity with BMI of 40.0-44.9, adult     BMI 40.9   . Nausea without vomiting    . OSA on CPAP 2015    CPAP nightly x 1 yr   . Osteoarthritis of knees, bilateral     and back   . TIA (transient ischemic attack) 2014    TIA 2014-no residual. Patient evaulated for possible TIA 07/12/2015  @ [facility] (dischage summary in epic)-per summary CT of head was normal and patient was d/c'd     Past Surgical History:   Procedure Laterality Date   . APPENDECTOMY  >25 yrs   . CHOLECYSTECTOMY     . EGD  01/2015   . EGD, BIOPSY N/A 09/15/2017    Procedure: EGD, BIOPSY;  Surgeon: Pershing Proud, MD;  Location:  [facility];  Service: Gastroenterology;  Laterality: N/A;   . fallopian tube surgery  >25 yrs   . INSERTION, PAIN PUMP (MEDICAL) N/A 10/22/2015    Procedure: INSERTION, PAIN PUMP (MEDICAL);  Surgeon: Nicola Police, DO;  Location: [facility];  Service: General;  Laterality: N/A;   . LAPAROSCOPIC, CHOLECYSTECTOMY, CHOLANGIOGRAM  08/13/2014   . LAPAROSCOPIC, GASTRIC BYPASS N/A 10/22/2015    Procedure: LAPAROSCOPIC, GASTRIC BYPASS;  Surgeon: Josefa Half R, DO;  Location: [place];  Service: General;  Laterality: N/A;   . LAPAROSCOPIC, HERNIORRHAPHY, HIATAL N/A 10/22/2015    Procedure: LAPAROSCOPIC, HERNIORRHAPHY, HIATAL;  Surgeon: Jeanell Sparrow, Hamid R, DO;  Location: [place] [facility];  Service: General;  Laterality: N/A;   . OVARY SURGERY Right >25 yrs   . SPINE SURGERY  11/2013    cervical HNP x 2, Dr. Bufford Buttner       Social History: Pt reports that she lives with her son, is retired from working at the  [employer] and states she currently is attending classes to work in Designer, jewellery.    Subjective   Patient is agreeable to participation in the therapy session. Nursing clears patient for therapy. Patient's medical condition is  appropriate for Speech therapy intervention at this time.  Patient's Goal:  n/a       Precautions: none    Objective   Completed swallow and SLP evaluation with formal and informal measures. RIPA-Problem Solving and Abstract Reasoning-100%. Excellent organization and specificity for describing cooking sequences, able to read a short paragraph and answer questions with high accuracy.      Educated the patient to role of speech therapy, plan of care, and goals of  therapy.      Goals of Care:  treatment/curative pathway    Goals:   n/a    Signature:Frani Mackinley Kiehn, M.S. CCC-SLP

## 2018-01-24 NOTE — Plan of Care (Signed)
Problem: Safety  Goal: Patient will be free from injury during hospitalization  Outcome: Progressing   01/24/18 1650   Goal/Interventions addressed this shift   Patient will be free from injury during hospitalization  Assess patient's risk for falls and implement fall prevention plan of care per policy;Provide and maintain safe environment;Use appropriate transfer methods;Ensure appropriate safety devices are available at the bedside;Include patient/ family/ care giver in decisions related to safety;Hourly rounding

## 2018-01-24 NOTE — ED Provider Notes (Signed)
I received patient on sign out.  She reports that her numbness symptoms have completely resolved.  She continues to have some left-sided chest pressure and left-sided headache.  Case was discussed with Dr. Ezzard Standing and Dr. Francesco Sor.  Case discussed with hospitalist for admission.    Labs and CT head here within normal limits.    ED Disposition     ED Disposition Condition Date/Time Comment    Admit  Mon Jan 24, 2018  7:52 AM Admitting Physician: Arna Medici [[id number]]   Diagnosis: Chest pain [6045409]   Estimated Length of Stay: > or = to 2 midnights   Tentative Discharge Plan?: Home or Self Care [1]   Patient Class: Inpatient [101]               Rich Fuchs, MD  01/24/18 [phone]

## 2018-01-24 NOTE — ED Notes (Signed)
Assumed care for the patient. Patient AOX4, denies facial numbness, eating crackers and denis cp. Husband at the bed side.

## 2018-01-25 ENCOUNTER — Ambulatory Visit (INDEPENDENT_AMBULATORY_CARE_PROVIDER_SITE_OTHER): Payer: Medicare Other | Admitting: Cardiology

## 2018-01-25 ENCOUNTER — Inpatient Hospital Stay: Payer: Medicare Other

## 2018-01-25 DIAGNOSIS — R079 Chest pain, unspecified: Secondary | ICD-10-CM

## 2018-01-25 DIAGNOSIS — I10 Essential (primary) hypertension: Secondary | ICD-10-CM

## 2018-01-25 LAB — LIPID PANEL
Cholesterol / HDL Ratio: 2.8
Cholesterol: 151 mg/dL (ref 0–199)
HDL: 53 mg/dL (ref 40–9999)
LDL Calculated: 93 mg/dL (ref 0–99)
Triglycerides: 26 mg/dL — ABNORMAL LOW (ref 34–149)
VLDL Calculated: 5 mg/dL — ABNORMAL LOW (ref 10–40)

## 2018-01-25 LAB — CBC
Absolute NRBC: 0 10*3/uL (ref 0.00–0.00)
Hematocrit: 37.1 % (ref 34.7–43.7)
Hgb: 11.9 g/dL (ref 11.4–14.8)
MCH: 28.9 pg (ref 25.1–33.5)
MCHC: 32.1 g/dL (ref 31.5–35.8)
MCV: 90 fL (ref 78.0–96.0)
MPV: 10.4 fL (ref 8.9–12.5)
Nucleated RBC: 0 /100 WBC (ref 0.0–0.0)
Platelets: 195 10*3/uL (ref 142–346)
RBC: 4.12 10*6/uL (ref 3.90–5.10)
RDW: 15 % (ref 11–15)
WBC: 3.23 10*3/uL (ref 3.10–9.50)

## 2018-01-25 LAB — BASIC METABOLIC PANEL
Anion Gap: 6 (ref 5.0–15.0)
BUN: 11 mg/dL (ref 7–19)
CO2: 27 mEq/L (ref 22–29)
Calcium: 8.7 mg/dL (ref 8.5–10.5)
Chloride: 112 mEq/L — ABNORMAL HIGH (ref 100–111)
Creatinine: 0.6 mg/dL (ref 0.6–1.0)
Glucose: 80 mg/dL (ref 70–100)
Potassium: 4 mEq/L (ref 3.5–5.1)
Sodium: 145 mEq/L (ref 136–145)

## 2018-01-25 LAB — HEMOGLOBIN A1C
Average Estimated Glucose: 96.8 mg/dL
Hemoglobin A1C: 5 % (ref 4.6–5.9)

## 2018-01-25 LAB — HEMOLYSIS INDEX: Hemolysis Index: 2 (ref 0–18)

## 2018-01-25 LAB — GFR: EGFR: >60

## 2018-01-25 MED ORDER — ASPIRIN 81 MG PO CHEW
81.00 mg | CHEWABLE_TABLET | Freq: Every day | ORAL | Status: DC
Start: 2018-01-25 — End: 2018-01-25

## 2018-01-25 MED ORDER — LORAZEPAM 2 MG/ML IJ SOLN
0.50 mg | Freq: Once | INTRAMUSCULAR | Status: AC
Start: 2018-01-25 — End: 2018-01-25
  Administered 2018-01-25: 10:00:00 0.5 mg via INTRAVENOUS
  Filled 2018-01-25: qty 1

## 2018-01-25 MED ORDER — KETOROLAC TROMETHAMINE 30 MG/ML IJ SOLN
30.00 mg | Freq: Once | INTRAMUSCULAR | Status: DC
Start: 2018-01-25 — End: 2018-01-25

## 2018-01-25 MED ORDER — DIPHENHYDRAMINE HCL 50 MG/ML IJ SOLN
25.00 mg | Freq: Once | INTRAMUSCULAR | Status: DC
Start: 2018-01-25 — End: 2018-01-25

## 2018-01-25 MED ORDER — TRAZODONE HCL 50 MG PO TABS
50.0000 mg | ORAL_TABLET | Freq: Every evening | ORAL | 1 refills | Status: DC
Start: 2018-01-25 — End: 2018-02-11

## 2018-01-25 MED ORDER — CARVEDILOL 3.125 MG PO TABS
3.1250 mg | ORAL_TABLET | Freq: Two times a day (BID) | ORAL | 1 refills | Status: DC
Start: 2018-01-25 — End: 2018-02-11

## 2018-01-25 MED ORDER — ASPIRIN EC 81 MG PO TBEC
81.00 mg | DELAYED_RELEASE_TABLET | Freq: Every day | ORAL | 1 refills | Status: AC
Start: 2018-01-25 — End: ?

## 2018-01-25 NOTE — Plan of Care (Signed)
Problem: Chest Pain  Goal: Vital signs and cardiac rhythm stable  Outcome: Progressing   01/25/18 0538   Goal/Interventions addressed this shift   Vital signs and cardiac rhythm stable Monitor Earnest Bailey vital signs/cardiac rhythms;Monitor labs       Problem: Day of Admission - Stroke  Goal: Core/Quality measure requirements - Admission  Outcome: Progressing   01/25/18 0538   Goal/Interventions addressed this shift   Core/Quality measure requirements - Admission VTE Prevention: Ensure anticoagulant(s) administered and/or anti-embolism stockings/devices documented as ordered;Document NIH Stroke Scale on admission;If diagnosis or history of Atrial Fib/Atrial Flutter, ensure oral anticoagulation is initiated or contraindication documented by LIP       Problem: Safety  Goal: Patient will be free from injury during hospitalization  Outcome: Progressing   01/25/18 0538   Goal/Interventions addressed this shift   Patient will be free from injury during hospitalization  Provide and maintain safe environment;Assess patient's risk for falls and implement fall prevention plan of care per policy;Hourly rounding;Assess for patients risk for elopement and implement Elopement Risk Plan per policy

## 2018-01-25 NOTE — Plan of Care (Signed)
Problem: Chest Pain  Goal: Vital signs and cardiac rhythm stable  Outcome: Progressing   01/25/18 1353   Goal/Interventions addressed this shift   Vital signs and cardiac rhythm stable Monitor /assess vital signs/cardiac rhythms;Monitor labs  (Vital signs stable ,remains in SR on monitor)     Goal: Cardiac pain management  Outcome: Progressing   01/25/18 1353   Goal/Interventions addressed this shift   Cardiac pain management  Assess/report chest pain/or related discomfort to LIP immediately;Instruct patient to report any change in pain status;Include patient and patient care companion in decisions related to pain management;Assess pain/or related discomfort on admission, during daily assessment, before and after any intervention  (Denies any chest pain or discmfort.)     Goal: Anxiety management/effective coping  Outcome: Progressing   01/25/18 1353   Goal/Interventions addressed this shift   Anxiety management/effective coping  Assess/report uncontrolled anxiety, depression or ineffective coping to LIP;Offer reassurance to decrease anxiety;Encourage patient to immediately report any increase in anxiety and/or depression;Include patient in decision making of their care and give updates on their health status  (Patient calm cooperative ,no anxiety noted)     Goal: Patient/Patient Care Companion demonstrates understanding of disease process, treatment plan, medications, and discharge plan  Outcome: Progressing   01/25/18 1353   Goal/Interventions addressed this shift   Patient/Patient Care Companion demonstrates understanding of disease process, treatment plan, medications and discharge plan  Educate patient to immediately report any chest pain/equivalent to RN;Reinforce with patient their activity level and to avoid Valsalva;Reinforce regarding cardiac diet and any fluid parameters;Assist patient/patient care companion to identify measures for cardiac risk factor management;Educate patient/patient care companion  regarding identified learning needs;Provide appropriate resources and referrals       Problem: Safety  Goal: Patient will be free from injury during hospitalization  Outcome: Progressing   01/25/18 1353   Goal/Interventions addressed this shift   Patient will be free from injury during hospitalization  Assess patient's risk for falls and implement fall prevention plan of care per policy;Provide and maintain safe environment;Use appropriate transfer methods;Ensure appropriate safety devices are available at the bedside;Include patient/ family/ care giver in decisions related to safety;Hourly rounding;Assess for patients risk for elopement and implement Elopement Risk Plan per policy;Provide alternative method of communication if needed (communication boards, writing)      01/25/18 1353   Goal/Interventions addressed this shift   Patient will be free from injury during hospitalization  Assess patient's risk for falls and implement fall prevention plan of care per policy;Provide and maintain safe environment;Use appropriate transfer methods;Ensure appropriate safety devices are available at the bedside;Include patient/ family/ care giver in decisions related to safety;Hourly rounding;Assess for patients risk for elopement and implement Elopement Risk Plan per policy;Provide alternative method of communication if needed (communication boards, writing)  (Patient encourage to call for asistance.)     Goal: Patient will be free from infection during hospitalization  Outcome: Progressing   01/25/18 1353   Goal/Interventions addressed this shift   Free from Infection during hospitalization Assess and monitor for signs and symptoms of infection;Monitor lab/diagnostic results;Monitor all insertion sites (i.e. indwelling lines, tubes, urinary catheters, and drains);Encourage patient and family to use good hand hygiene technique  (Will monitor daily labs & cultures results.)       Problem: Pain  Goal: Pain at adequate level as  identified by patient  Outcome: Progressing   01/25/18 1353   Goal/Interventions addressed this shift   Pain at adequate level as identified by patient Identify patient comfort  function goal;Assess pain on admission, during daily assessment and/or before any "as needed" intervention(s);Reassess pain within 30-60 minutes of any procedure/intervention, per Pain Assessment, Intervention, Reassessment (AIR) Cycle;Evaluate patient's satisfaction with pain management progress;Evaluate if patient comfort function goal is met;Include patient/patient care companion in decisions related to pain management as needed  (Patient denies any pain or discomfort)       Problem: Side Effects from Pain Analgesia  Goal: Patient will experience minimal side effects of analgesic therapy  Outcome: Progressing   01/25/18 1353   Goal/Interventions addressed this shift   Patient will experience minimal side effects of analgesic therapy Monitor/assess patient's respiratory status (RR depth, effort, breath sounds);Assess for changes in cognitive function;Prevent/manage side effects per LIP orders (i.e. nausea, vomiting, pruritus, constipation, urinary retention, etc.);Evaluate for opioid-induced sedation with appropriate assessment tool (i.e. POSS)  (No pain meds or narcotics needed at this time.)       Problem: Discharge Barriers  Goal: Patient will be discharged home or other facility with appropriate resources  Outcome: Progressing   01/25/18 1353   Goal/Interventions addressed this shift   Discharge to home or other facility with appropriate resources Provide appropriate patient education;Provide information on available health resources;Initiate discharge planning  (No needs at this time ,will be able to go home once cleared )       Problem: Psychosocial and Spiritual Needs  Goal: Demonstrates ability to cope with hospitalization/illness  Outcome: Progressing   01/25/18 1353   Goal/Interventions addressed this shift   Demonstrates ability  to cope with hospitalizations/illness Encourage verbalization of feelings/concerns/expectations;Provide quiet environment;Encourage patient to set small goals for self;Assist patient to identify own strengths and abilities;Encourage participation in diversional activity;Reinforce positive adaptation of new coping behaviors;Include patient/ patient care companion in decisions;Communicate referral to spiritual care as appropriate

## 2018-01-25 NOTE — Discharge Instr - AVS First Page (Signed)
Reason for your Hospital Admission:  Headache, likely stress, sleep deprivation    Instructions for after your discharge:  Adequate sleep, 7 hours per day  Follow up with neurology as needed

## 2018-01-25 NOTE — Progress Notes (Signed)
CARDIOLOGY HOSPITAL PROGRESS NOTE  Date:  01/25/2018, 3:08 PM  Patient:  Amber [name].    DOB:  January 26, 1951.      67 y.o. female admitted 01/24/2018     PROBLEM LIST and HPI:  1 HTN  --- ECHO 11/05/17 EF 65   # A/Plan:  Somewhat better, cont coreg.  Follow up with Dr Katrinka Blazing on d/c.  Will see pt intermittently    2 Atypical CP  --- CAD on cardiac cath in 2015 and no ischemia on recent nuclear stress test   # A/Plan:  Observe.  Stress testing not indicated now    3 Likely TIA   # A/Plan:   Appreciate neuro's note    MEDICATION:   INFUSION MEDS:      SCHEDULED MEDS:     Current Facility-Administered Medications   Medication Dose Route Frequency   . aspirin  81 mg Oral Daily   . carvedilol  3.125 mg Oral Q12H SCH   . diphenhydrAMINE  25 mg Intravenous Once   . enoxaparin  40 mg Subcutaneous Daily   . famotidine  20 mg Oral Q12H SCH   . ketorolac  30 mg Intravenous Once   . lidocaine  1 patch Transdermal Daily   . magnesium sulfate  1 g Intravenous BID      PRN MEDS:   albuterol, diazePAM, fluticasone, naloxone, ondansetron **OR** ondansetron    SOCIAL HISTORY:  Social History   Substance Use Topics   . Smoking status: Never Smoker   . Smokeless tobacco: Never Used   . Alcohol use No       FAMILY HISTORY:  family history includes Breast cancer in her mother; Cancer (age of onset: 69) in her mother; Heart disease in her father.    BP 146/73   Pulse 89   Temp 97.1 F (36.2 C) (Axillary)   Resp 21   Ht 1.549 m (5\' 1" )   Wt 60.1 kg (132 lb 7.9 oz)   SpO2 97%   BMI 25.03 kg/m   PHYSICAL EXAM:   >> General:   no distress,  >> Respiratory:   unlabored respiration,  no crackles,  >> Cardiovascular:   regular rhythm,  No LE edema,  >> Gastrointestinal:   Abdomen soft and nontender,  >> Musculoskeletal:   normal muscle tone and strength upper extremities >> Skin:   warm and dry,  >> Neuro/Psych:   Alert,  Appropriate mood and affect,   .  SELECTED LABS:     Recent Labs  Lab 01/25/18  0506   WBC 3.23   Hgb 11.9   Hematocrit  37.1   Platelets 195        Recent Labs  Lab 01/25/18  0506   Sodium 145   Potassium 4.0   Chloride 112*   CO2 27   BUN 11   Creatinine 0.6       Recent Labs  Lab 01/24/18  0715   PT INR 1.0       Recent Labs  Lab 01/24/18  0715   Troponin I <0.01       Terrika Zuver Alyson Locket, MD [facility]  [facility]  [facility]  Tel [phone]

## 2018-01-25 NOTE — Progress Notes (Signed)
Patient left the unit with stable vital sign via wheelchair accompanied by personal belongings.Copy of med rec & discharge instruction given to patient.

## 2018-01-25 NOTE — Final Progress Note (DC Note for stay less than 48 (Signed)
Date:01/25/2018   Patient Name: Amber [name]  Attending Physician: Carmelina Paddock, MD  Today:   BP 146/73   Pulse 89   Temp 97.1 F (36.2 C) (Axillary)   Resp 21   Ht 1.549 m (5\' 1" )   Wt 60.1 kg (132 lb 7.9 oz)   SpO2 97%   BMI 25.03 kg/m   Ranges for the last 24 hours:  Temp:  [96.9 F (36.1 C)-97.6 F (36.4 C)] 97.1 F (36.2 C)  Heart Rate:  [58-89] 89  Resp Rate:  [16-21] 21  BP: (120-146)/(56-73) 146/73   Constitutional:. Normal appearing, in no acute distress, alert, oriented 3  HEENT: NC/AT, PERRL, no scleral icterus or conjunctival pallor, no nasal discharge, MMM, oropharynx without erythema or exudate  Neck: trachea midline, supple, no cervical or supraclavicular lymphadenopathy or masses  Cardiovascular: RRR, normal S1 S2, no murmurs, gallops, palpable thrills, no JVD, Non-displaced PMI.  Respiratory: Normal rate. No retractions or increased work of breathing. Clear to auscultation and percussion bilaterally.  Gastrointestinal: +BS, non-distended, soft, non-tender, no rebound or guarding, no hepatosplenomegaly  Genitourinary: no suprapubic or costovertebral angle tenderness  Musculoskeletal: ROM and motor strength grossly normal. No clubbing, edema, or cyanosis. DP and radial pulses 2+ and symmetric,  Skin exam: Normal  Neurologic: EOMI, CN 2-12 grossly intact. no gross motor or sensory deficits  Psychiatric: AAOx3, affect and mood appropriate. The patient is alert, interactive, appropriate.  Capillary refill:  Normal     Date of Admission:   01/24/2018    Date of Discharge:   01/25/2018     Outcome of Hospitalization:   Principal Problem:    Transient cerebral ischemia, unspecified type  Active Problems:    Essential hypertension    Paresthesia    Headache    Chest pain  Resolved Problems:    * No resolved hospital problems. *     Per H&P  Amber [name] is a 67 y.o. female presenting with intractable headache, chest pain and TIA symptoms  Patient reported symptoms started one week ago.  She has constant sharp central headache associated with blurry vision. She woke up this morning with left face and arm numbness  She also developed chest pain yesterday.   She went to ER. CT head no acute abnormalities. CXR, EKG unremarkable  She denies weakness, syncope, seizure      Hospital course  # TIA symptoms  Neurology consulted. MRI/MRA head and neck. No acute findings. Started on Aspirin, LDL <100    # headache  Likely stress, sleep deprivation. Treated with toradol    # Chest pain  Seen by cardiology, Chest pain is atypical and would not recommend further ischemic testing    Lab Results last 48 Hours     Procedure Component Value Units Date/Time    Lipid panel [[id number]]  (Abnormal) Collected:  01/25/18 0506    Specimen:  Blood Updated:  01/25/18 1009     Cholesterol 151 mg/dL      Triglycerides 26 (L) mg/dL      HDL 53 mg/dL      LDL Calculated 93 mg/dL      VLDL Cholesterol Cal 5 (L) mg/dL      CHOL/HDL Ratio 2.8    Hemolysis index [[id number]] Collected:  01/25/18 0506     Updated:  01/25/18 1009     Hemolysis Index 2    Hemoglobin A1C [[id number]] Collected:  01/25/18 0506    Specimen:  Blood Updated:  01/25/18 1601  Hemoglobin A1C 5.0 %      Average Estimated Glucose 96.8 mg/dL     Basic Metabolic Panel [[id number]]  (Abnormal) Collected:  01/25/18 0506    Specimen:  Blood Updated:  01/25/18 0637     Glucose 80 mg/dL      BUN 11 mg/dL      Creatinine 0.6 mg/dL      Calcium 8.7 mg/dL      Sodium 409 mEq/L      Potassium 4.0 mEq/L      Chloride 112 (H) mEq/L      CO2 27 mEq/L      Anion Gap 6.0    GFR [[id number]] Collected:  01/25/18 0506     Updated:  01/25/18 0637     EGFR >60.0    CBC [[id number]] Collected:  01/25/18 0506    Specimen:  Blood from Blood Updated:  01/25/18 0606     WBC 3.23 x10 3/uL      Hgb 11.9 g/dL      Hematocrit 57.8 %      Platelets 195 x10 3/uL      RBC 4.12 x10 6/uL      MCV 90.0 fL      MCH 28.9 pg      MCHC 32.1 g/dL      RDW 15 %      MPV 10.4 fL      Nucleated RBC 0.0  /100 WBC      Absolute NRBC 0.00 x10 3/uL     Sedimentation rate (ESR) [[id number]] Collected:  01/24/18 0715    Specimen:  Blood Updated:  01/24/18 0748     Sed Rate 12 mm/Hr     Troponin I [[id number]] Collected:  01/24/18 0715    Specimen:  Blood Updated:  01/24/18 0745     Troponin I <0.01 ng/mL     Comprehensive metabolic panel [[id number]] Collected:  01/24/18 0715    Specimen:  Blood Updated:  01/24/18 0744     Glucose 84 mg/dL      BUN 03.4 mg/dL      Creatinine 0.7 mg/dL      Sodium 742 mEq/L      Potassium 3.5 mEq/L      Chloride 109 mEq/L      CO2 26 mEq/L      Calcium 9.2 mg/dL      Protein, Total 6.8 g/dL      Albumin 4.0 g/dL      AST (SGOT) 17 U/L      ALT 13 U/L      Alkaline Phosphatase 79 U/L      Bilirubin, Total 0.4 mg/dL      Globulin 2.8 g/dL      Albumin/Globulin Ratio 1.4     Anion Gap 9.0    GFR [[id number]] Collected:  01/24/18 0715     Updated:  01/24/18 0744     EGFR >60.0    APTT [[id number]] Collected:  01/24/18 0715     Updated:  01/24/18 0737     PTT 28 sec     Protime - INR [[id number]] Collected:  01/24/18 0715    Specimen:  Blood Updated:  01/24/18 0734     PT 13.6 sec      PT INR 1.0     PT Anticoag. Given Within 48 hrs. None    D-Dimer [[id number]] Collected:  01/24/18 0715     Updated:  01/24/18 5732     D-Dimer 0.28 ug/mL FEU  CBC with differential [[id number]] Collected:  01/24/18 0715    Specimen:  Blood from Blood Updated:  01/24/18 0731     WBC 3.91 x10 3/uL      Hgb 12.5 g/dL      Hematocrit 82.9 %      Platelets 218 x10 3/uL      RBC 4.48 x10 6/uL      MCV 88.4 fL      MCH 27.9 pg      MCHC 31.6 g/dL      RDW 15 %      MPV 10.0 fL      Neutrophils 46.5 %      Lymphocytes Automated 40.2 %      Monocytes 10.2 %      Eosinophils Automated 2.3 %      Basophils Automated 0.8 %      Immature Granulocyte 0.0 %      Nucleated RBC 0.0 /100 WBC      Neutrophils Absolute 1.82 x10 3/uL      Abs Lymph Automated 1.57 x10 3/uL      Abs Mono Automated 0.40 x10 3/uL      Abs Eos Automated 0.09  x10 3/uL      Absolute Baso Automated 0.03 x10 3/uL      Absolute Immature Granulocyte 0.00 x10 3/uL      Absolute NRBC 0.00 x10 3/uL     Glucose Whole Blood - POCT [[id number]] Collected:  01/24/18 7846     Updated:  01/24/18 0650     POCT - Glucose Whole blood 78 mg/dL           Procedures performed:   Radiology: all results from this admission  Ct Head Wo Contrast    Result Date: 01/24/2018   No acute intracranial findings or substantial change.  Nicki Reaper, MD 01/24/2018 6:57 AM    Mra Head Wo Contrast    Result Date: 01/25/2018    Normal MRA of the head. Georgiana Spinner, MD 01/25/2018 11:47 AM    Mra Neck Wo Contrast    Result Date: 01/25/2018   Normal MRA of the neck including anterior and posterior circulation. Georgiana Spinner, MD 01/25/2018 11:46 AM    Mri Brain W Wo Contrast    Result Date: 01/24/2018  No evidence of an acute stroke. 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 Joselyn Glassman, MD 01/24/2018 12:06 PM    Chest Ap Portable    Result Date: 01/24/2018  No acute findings or substantial change. Nicki Reaper, MD 01/24/2018 6:48 AM      Treatment Team:   Attending Provider: Carmelina Paddock, MD  Consulting Physician: Stephens Shire, MD  Consulting Physician: Cathe Mons, MD    Disposition:   Disposition: Home or Self Care    Condition at Discharge:   good         Discharge Instructions:     Follow-up Information     Cathe Mons, MD. Schedule an appointment as soon as possible for a visit.    Specialties:  Neurology, Sleep Medicine  Contact information:  [address] [place]  [place] [address]  [phone]             Oneita Jolly, MD .    Specialty:  Internal Medicine  Contact information:  [phone] [place] [address]  [place] [address]  [phone]  Discharge Medication List      Taking    albuterol 108 (90 Base) MCG/ACT inhaler  Commonly known as:  PROVENTIL HFA;VENTOLIN HFA  Inhale into the lungs every 4 (four) hours as needed.     aspirin  EC 81 MG EC tablet  Dose:  81 mg  Take 1 tablet (81 mg total) by mouth daily.     carvedilol 3.125 MG tablet  Dose:  3.125 mg  Commonly known as:  COREG  Take 1 tablet (3.125 mg total) by mouth every 12 (twelve) hours.     diazePAM 2 MG tablet  Dose:  2 mg  Commonly known as:  VALIUM  Take 1 tablet (2 mg total) by mouth every 8 (eight) hours as needed for Anxiety.     fluticasone 50 MCG/ACT nasal spray  Dose:  2 spray  What changed:   when to take this   reasons to take this  Commonly known as:  FLONASE  2 sprays by Nasal route daily.     lidocaine 5 %  Dose:  1 patch  Commonly known as:  LIDODERM  Place 1 patch onto the skin daily.Remove & Discard patch within 12 hours or as directed by MD     pantoprazole 40 MG tablet  What changed:   how much to take   how to take this   when to take this  Commonly known as:  PROTONIX  TAKE 1 TABLET(40 MG) BY MOUTH DAILY     valACYclovir HCL 500 MG tablet  What changed:  See the new instructions.  Commonly known as:  VALTREX  TAKE 1 TABLET(500 MG) BY MOUTH DAILY     VITAMIN D-1000 MAX ST 1000 units tablet  Generic drug:  cholecalciferol  Take 1,000 Units by Mouth Once a Day at lunch time     Vitamin-B Complex Tabs  Take  by Mouth Once in the morning        STOP taking these medications    atenolol 25 MG tablet  Commonly known as:  TENORMIN            Signed by: Carmelina Paddock, MD

## 2018-01-25 NOTE — Progress Notes (Addendum)
Rounded on patient. Discussed signs and symptoms of stroke, when to call 911, risk factors, disease process, treatment plan, medications (including statin and antithrombotics), discharge plan and consequences of noncompliance. Patient verbalized understanding. All questions and concerns were addressed. Gave NSA booklet "Explaining Stroke", [facility] flyer, FAST magnet and my business card. Pt refuses to take any statin medication, but is agreeable to ASA after d/c. Dr. Francesco Sor made aware.

## 2018-01-25 NOTE — Plan of Care (Signed)
Seen & cleared for discharge by Dr Milagros Evener & will need to follow up as outpatient with PMD & Neurology.Patient verbalized understanding of discharge instruction & follow up appt.Family did not come up to unit & just waited for her in the car.Saline lock discontinue.

## 2018-01-25 NOTE — UM Notes (Signed)
THIS 67 YO FEMALE stated that she is having chest pain x 4 days and hypertension x 1 week with neck pain    Past Medical History:  . Asthma   . Chest pain 2016    Chest pain after eating x6  months--being  followed by GI for GERD  . Constipation   . Difficulty walking    amb with cane secondary to arthritis bil knees  . Genital herpes    no current outbreak (10/03/15)  . GERD (gastroesophageal reflux disease)   . Hiatal hernia    h/o surgery  . Hyperlipidemia   . Hypertensive disorder    Well controlled on med. Denies any SOB in last 6  months (10/03/15)  . Low back pain   . Morbid obesity with BMI of 40.0-44.9, adult    BMI 40.9  . Nausea without vomiting   . OSA on CPAP 2015   CPAP nightly x 1 yr  . Osteoarthritis of knees, bilateral    and back  . TIA (transient ischemic attack) 2014   TIA 2014-no residual. Patient evaulated for  possible TIA 07/12/2015  @ [facility] (dischage  summary in epic)-per summary CT of head was  normal and patient was d/c'd    Past Surgical History:  . APPENDECTOMY  >25 yrs  . CHOLECYSTECTOMY    . EGD  01/2015  . EGD, BIOPSY N/A 09/15/2017   Procedure: EGD, BIOPSY;  Surgeon:   . fallopian tube surgery  >25 yrs  . INSERTION, PAIN PUMP (MEDICAL) N/A 10/22/2015   Procedure: INSERTION, PAIN PUMP (MEDICAL);  Marland Kitchen LAPAROSCOPIC, CHOLECYSTECTOMY,  CHOLANGIOGRAM  08/13/2014  . LAPAROSCOPIC, GASTRIC BYPASS N/A 10/22/2015   Procedure: LAPAROSCOPIC, GASTRIC  BYPASS;    . LAPAROSCOPIC, HERNIORRHAPHY, HIATAL N/A 10/22/2015   Procedure: LAPAROSCOPIC,  HERNIORRHAPHY, HIATAL;    . OVARY SURGERY Right >25 yrs  . SPINE SURGERY  11/2013   cervical HNP x 2, Dr. Bufford Buttner      NEUROLOGY CONSULTATION  Assessment & Plan:   Episode of left face and arm numbness, currently resolved, likely transient ischemic attack  Headaches, ongoing for the last 1 week, with some migraine-like features, but with no prior history of headaches  ? MRI brain with and without contrast, MRA of the head and neck  needs to be done.  ? Echo as part of transient ischemic attack workup, echo can be done as outpatient as well  ? Magnesium sulfate for headache.  ? Aspirin for transient ischemic attack.  ? ?  LP for headaches, given no prior history of headaches, UNLESSthey resolved with medication management  ? Check lipid panel.  ? Will follow    INTERNAL MEDICINE:  Assessment and plan    Active Hospital Problems   Diagnosis  . Chest pain  . Headache  . Essential hypertension  . Paresthesia    # Intractable headache  # TIA like symptoms  DDx TIA, Migraine, pseudutumour Cerebri  Neurology consulted  MRI/MRA head and neck  TTE  PT/OT  Aspirin  Lipid panel  Toradol, Phenergan for headache  LP discussed. Patient would like to think about it.    # Chest pain  Seen by cardiology, Chest pain is atypical and would not recommend further ischemic testing    # Hypertension  MP stable  Atenolol switched to Coreg    FULL CODE    TESTS:    01/24/18 CT Head WO Contrast   Impression:      No acute intracranial findings or substantial change  01/24/18 Chest AP Portable   Impression:      No acute findings or substantial change    01/24/18 MRI Brain W WO Contrast   IMPRESSION:     No evidence of an acute stroke.    Curvilinear lipoma of the corpus callosum.    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  Regular diet with thin liquids.    Physical Therapy Evaluation  D/C Suggestions   Recommendation  Discharge Recommendation: Home with no needs  DME Recommended for Discharge:  (none)    Occupational Therapy Evaluation  D/C Suggestions   Home, no OT needs  Transport Recommendations: no limitations    ED Disposition    ED  Disposition Condition Date/Time Comment   Admit  Mon Jan 24, 2018  7:52 AM Admitting Physician: Arna Medici [[id number]]   Diagnosis: Chest pain [[id number]]   Estimated Length of Stay: > or = to 2 midnights   Tentative Discharge Plan?: Home or Self Care [1]   Patient Class: Inpatient [101    ANTICIPATED [place] TO HOME IN 1-2 DAYS.    Bennett Scrape RN, BSN, [facility]   Case Manager  [place]  [phone]

## 2018-01-25 NOTE — Progress Notes (Signed)
Date Time: 01/25/18 12:57 PM  Patient Name: Amber [name]  Attending Physician: Carmelina Paddock, MD  Patient Class: Inpatient  Hospital Day: 1            NEUROLOGY PROGRESS NOTE             Assessment/Plan   Left face, arm numbness, resolved.  Likely transient ischemic attack.  Headaches, new onset ongoing for the last 1 week with some migraine features, still ongoing.  No response to magnesium yet      Trial with Benadryl, and Toradol combination.  Aspirin.  Needs to be on statin as well  I suggested lumbar puncture given new onset of headaches, but patient declined  Hold off on echo.  Given this was done in January was normal      Subjective:   Patient Seen and Examined. Still complaining of headache    Review of Systems:   Mod  headache, no eye, ear nose, throat problems; no coughing or wheezing or shortness of breath, No chest pain or orthopnea, no abdominal pain, nausea or vomiting, No pain in the body or extremities, no psychiatric, neurological, endocrine, hematological or cardiac complaints except as noted above.          Physical Exam:   BP 146/73   Pulse 89   Temp 97.1 F (36.2 C) (Axillary)   Resp 21   Ht 1.549 m (5\' 1" )   Wt 60.1 kg (132 lb 7.9 oz)   SpO2 97%   BMI 25.03 kg/m   Awake, alert, oriented 3.  Speech fluent.  Smile symmetric.  Moving both arms Well    Meds:      Scheduled Meds: PRN Meds:      carvedilol 3.125 mg Oral Q12H SCH   enoxaparin 40 mg Subcutaneous Daily   famotidine 20 mg Oral Q12H SCH   lidocaine 1 patch Transdermal Daily   magnesium sulfate 1 g Intravenous BID       Continuous Infusions:     albuterol 1 puff Q6H PRN   diazePAM 2 mg Q8H PRN   fluticasone 2 spray Daily PRN   ketorolac 30 mg Q6H PRN   naloxone 0.2 mg PRN   ondansetron 4 mg Q6H PRN   Or     ondansetron 4 mg Q6H PRN               Labs:     Recent Labs  Lab 01/25/18  0506 01/24/18  0715   Glucose 80 84   BUN 11 11.0   Creatinine 0.6 0.7   Calcium 8.7 9.2   Sodium 145 144   Potassium 4.0 3.5   Chloride 112*  109   CO2 27 26   Albumin  --  4.0   AST (SGOT)  --  17   ALT  --  13   Bilirubin, Total  --  0.4   Alkaline Phosphatase  --  79       Recent Labs  Lab 01/25/18  0506 01/24/18  0715   WBC 3.23 3.91   Hgb 11.9 12.5   Hematocrit 37.1 39.6   MCV 90.0 88.4   MCH 28.9 27.9   MCHC 32.1 31.6   Platelets 195 218         Recent Labs      01/24/18   0715   PTT  28   PT  13.6   PT INR  1.0          Radiology Results (24  Hour)     Procedure Component Value Units Date/Time    MRA Head WO Contrast [[id number]] Collected:  01/25/18 1147    Order Status:  Completed Updated:  01/25/18 1151    Narrative:       CLINICAL INDICATION: Transient ischemic attacks.     COMPARISON: None available.     TECHNIQUE:  3-D time-of-flight methodology was utilized to assess the  vessels at the base of the brain and the major branches of the circle of  Willis. Multiplanar reconstructed images were created from the source  data.    INTERPRETATION:  The distal internal carotid arteries and the basilar  artery are normal in course and caliber. The proximal portions of the  anterior, middle and posterior cerebral arteries are normal in course  and caliber.  No aneurysms or vascular abnormalities are evident. The  vertebral arteries terminate in a normal basilar artery.      Impression:         Normal MRA of the head.    Georgiana Spinner, MD   01/25/2018 11:47 AM    MRA Neck WO Contrast [[id number]] Collected:  01/25/18 1142    Order Status:  Completed Updated:  01/25/18 1150    Narrative:       CLINICAL HISTORY: TIAs.    COMPARISON: Carotid duplex of 11/15/2014.    TECHNIQUE: Utilizing 1.5 tesla field strength, 2-D time of flight  non-contrast MR angiography was performed through the neck arterial  system. Multiplanar MIP 2-D and 3-D reconstructions were done from the  dataset.    FINDINGS: Measurements of the carotid arteries are performed utilizing  NASCET criteria. Evaluation of the superior aspect of the aortic arch  demonstrates standard arch anatomy  with widely patent origins of the  great vessels of the neck. Innominate, proximal common and proximal  subclavian arteries are all widely patent. The anterior circulation is  unremarkable with widely patent common carotid arteries and carotid  bifurcations. There is no evidence of major plaque formation or stenosis  at the carotid bifurcations. Common, internal and external carotid  arteries are widely patent. The internal carotid arteries are visualized  into the carotid siphons without abnormality.    Evaluation of the posterior circulation reveals widely patent vertebral  arteries bilaterally. They form a normal appearing basilar artery. There  is no evidence of significant stenotic disease.      Impression:        Normal MRA of the neck including anterior and posterior  circulation.    Georgiana Spinner, MD   01/25/2018 11:46 AM           All brain imaging (MRI, CT) personally reviewed.    Case discussed with: pt         This note was generated by the Epic EMR system/ Dragon speech recognition and may contain inherent errors or omissions not intended by the user. Grammatical errors, random word insertions, deletions and pronoun errors  are occasional consequences of this technology due to software limitations.     Not all errors are caught or corrected. If there are questions or concerns about the content of this note or information contained within the body of this dictation they should be addressed directly with the author for clarification.      Signed by: Cathe Mons, MD  Spectralink: [id number]      Answering Service: [phone]

## 2018-01-28 ENCOUNTER — Telehealth (HOSPITAL_BASED_OUTPATIENT_CLINIC_OR_DEPARTMENT_OTHER): Payer: Self-pay | Admitting: Gastroenterology

## 2018-01-28 NOTE — Telephone Encounter (Signed)
Sharepoint Assessment Questions    5'4"  167 LBS    GENERAL QUESTIONS:  Does the patient have sufficient understanding of English? Yes    Is the patient able to provide consent? Yes     Patient Assessment    Have you had a previous Endoscopy? Yes  If yes, where/when/what procedure type?  @ [facility]    Do you have an allergy to Fentanyl or Versed? No    Have you ever had any problems in the past with sedation (Different from General Anesthesia)? No    Do you use narcotics on a daily basis? No    Does you use oxygen at Home?  No    Are you taking any Anti-coagulant or Anti-Platelet Medications? No    Do you have any bleeding or coagulation disorders? No    Are you Diabetic?  No    Do you have End Stage Renal Disease? No    Do you have End Stage Liver Disease? No    Do you have diagnosed sleep apnea? No    Do you currently have a diagnosis of Congestive Heart Failure or CHF that you are being treated for? No    Do you have a Pacemaker or Defibrillator? No    Are you a difficult IV start? No    Do you have any mobility issues that make it difficult to get onto a stretcher? No    Is this patient scheduled for a colonoscopy? No    Is this an Upper Endoscopy SEDATION Case (EGD, EUS, PEG)? Yes  If yes, are you able to open your mouth fully without any difficulty? Yes  If no, arranged for Anesthesia? N/a    Is the patient scheduled for an ERCP? No    Is this patient scheduled for a PEG? No    Is the patient scheduled for an ALS PEG?  No    PROCEDURE        Procedure MD: Tye Savoy  Procedure Type: EUS  Procedure Date:  5/30       Time: 1100    Procedure Check-In Time: 1030    Patient Teaching  Who received the teaching - Patient? Yes    Was the topic of stopping iron supplements taught? Yes    Was the topic of blood thinners taught?  Yes    Was the topic of diet taught? Yes    Were instructions for taking current medications taught? Yes    Was our transportation policy taught? Yes    What is the name of  their driver? Sister    How were the procedure preparation instruction materials delivered?  Verbal: Yes  Mailed Paper: Yes  eCare message: No  Email: No    Does this procedure require bowel prep? No  If yes, were instructions for the ordered laxative taught? No  If yes, which RX was prescribed? NA.    General Notes:

## 2018-01-30 LAB — ECG 12-LEAD
Atrial Rate: 60 {beats}/min
P Axis: 80 degrees
P-R Interval: 180 ms
Q-T Interval: 410 ms
QRS Duration: 88 ms
QTC Calculation (Bezet): 410 ms
R Axis: 3 degrees
T Axis: 28 degrees
Ventricular Rate: 60 {beats}/min

## 2018-02-01 ENCOUNTER — Encounter (INDEPENDENT_AMBULATORY_CARE_PROVIDER_SITE_OTHER): Payer: Self-pay | Admitting: Internal Medicine

## 2018-02-01 ENCOUNTER — Ambulatory Visit (INDEPENDENT_AMBULATORY_CARE_PROVIDER_SITE_OTHER): Payer: Medicare Other | Admitting: Internal Medicine

## 2018-02-01 VITALS — BP 151/83 | HR 86 | Temp 97.9°F | Wt 132.0 lb

## 2018-02-01 DIAGNOSIS — R002 Palpitations: Secondary | ICD-10-CM

## 2018-02-01 DIAGNOSIS — I10 Essential (primary) hypertension: Secondary | ICD-10-CM

## 2018-02-01 MED ORDER — AMLODIPINE BESYLATE 2.5 MG PO TABS
2.5000 mg | ORAL_TABLET | Freq: Every day | ORAL | 1 refills | Status: DC
Start: 2018-02-01 — End: 2018-02-11

## 2018-02-01 NOTE — Progress Notes (Signed)
Subjective:       Patient ID: Amber [name] is a 67 y.o. female.    HPI  Pt is here for follow up     1. HTN, was adm at [facility] hosp over night, last week for elevated BP and face numbness, suspected TIA.   MRI brain no acute stroke, MRA head/neck neg.   Atenolol changed to carvedilol, pt states this makes her fatigue.   Still has palp, no syncope,   2. Not sleeping well, rx'd trazodone at hosp, though pt has not taken it yet.     The following portions of the patient's history were reviewed and updated as appropriate: allergies, current medications, past family history, past medical history, past social history, past surgical history and problem list.    Review of Systems   Constitutional: Negative for appetite change and unexpected weight change.   Eyes: Negative for visual disturbance.   Respiratory: Negative for shortness of breath.    Cardiovascular: Positive for palpitations. Negative for chest pain.   Gastrointestinal: Negative for abdominal pain, nausea and vomiting.   Genitourinary: Negative for dysuria.   Neurological: Negative for syncope, weakness and numbness.     BP 151/83   Pulse 86   Temp 97.9 F (36.6 C) (Oral)   Wt 59.9 kg (132 lb)   BMI 24.94 kg/m        Objective:    Physical Exam   Constitutional: She is oriented to person, place, and time. She appears well-developed. No distress.   HENT:   Mouth/Throat: Oropharynx is clear and moist. No oropharyngeal exudate.   Eyes: Conjunctivae are normal. No scleral icterus.   Neck: Neck supple. No JVD present. Carotid bruit is not present. No thyromegaly present.   Cardiovascular: Normal rate, regular rhythm, normal heart sounds and intact distal pulses.  Exam reveals no gallop and no friction rub.    No murmur heard.  Pulmonary/Chest: Effort normal and breath sounds normal. No respiratory distress. She has no rales.   Abdominal: Soft. Bowel sounds are normal. She exhibits no distension and no mass. There is no tenderness. There is no guarding.    Musculoskeletal: She exhibits no edema.   Neurological: She is alert and oriented to person, place, and time.   No focal deficits.            Assessment:       1. Essential hypertension  amLODIPine (NORVASC) 2.5 MG tablet   2. Palpitations            Plan:      Procedures  No orders of the defined types were placed in this encounter.      rx add amlodipine 2.5mg  daily  Continue meds  Monitor BP  F/u cardiology,   F/u 4 weeks.

## 2018-02-10 ENCOUNTER — Telehealth (INDEPENDENT_AMBULATORY_CARE_PROVIDER_SITE_OTHER): Payer: Self-pay | Admitting: Internal Medicine

## 2018-02-10 NOTE — Telephone Encounter (Signed)
Patient called asked for hospital dis chard appointment and high blood presser also do need med change also please advise  # [phone]

## 2018-02-10 NOTE — Telephone Encounter (Signed)
Please schedule pt for appointment. Thank you

## 2018-02-11 ENCOUNTER — Ambulatory Visit (INDEPENDENT_AMBULATORY_CARE_PROVIDER_SITE_OTHER): Payer: Medicare Other | Admitting: Family Medicine

## 2018-02-11 ENCOUNTER — Other Ambulatory Visit (INDEPENDENT_AMBULATORY_CARE_PROVIDER_SITE_OTHER): Payer: Self-pay | Admitting: Internal Medicine

## 2018-02-11 ENCOUNTER — Encounter (INDEPENDENT_AMBULATORY_CARE_PROVIDER_SITE_OTHER): Payer: Self-pay | Admitting: Family Medicine

## 2018-02-11 ENCOUNTER — Ambulatory Visit (INDEPENDENT_AMBULATORY_CARE_PROVIDER_SITE_OTHER): Payer: Medicare Other | Admitting: Internal Medicine

## 2018-02-11 ENCOUNTER — Encounter (INDEPENDENT_AMBULATORY_CARE_PROVIDER_SITE_OTHER): Payer: Self-pay | Admitting: Internal Medicine

## 2018-02-11 VITALS — BP 143/84 | HR 79 | Temp 98.1°F | Resp 16 | Wt 132.4 lb

## 2018-02-11 DIAGNOSIS — G459 Transient cerebral ischemic attack, unspecified: Secondary | ICD-10-CM

## 2018-02-11 DIAGNOSIS — E785 Hyperlipidemia, unspecified: Secondary | ICD-10-CM

## 2018-02-11 DIAGNOSIS — Z8673 Personal history of transient ischemic attack (TIA), and cerebral infarction without residual deficits: Secondary | ICD-10-CM

## 2018-02-11 DIAGNOSIS — R002 Palpitations: Secondary | ICD-10-CM

## 2018-02-11 DIAGNOSIS — I10 Essential (primary) hypertension: Secondary | ICD-10-CM

## 2018-02-11 MED ORDER — AMLODIPINE BESYLATE 5 MG PO TABS
5.0000 mg | ORAL_TABLET | Freq: Every day | ORAL | 0 refills | Status: DC
Start: 2018-02-11 — End: 2018-02-16

## 2018-02-11 NOTE — Progress Notes (Signed)
Subjective:      Patient ID: Amber [name] is a 67 y.o. female.    Chief Complaint:  Chief Complaint   Patient presents with   . Follow-up     hospital admission on 02/08/2018 at [facility] for elevated BP and headache         HPI:  Here for follow-up from recent hospitalization.   Inpatient facility: [facility]  Date of admission: April 9  Date of discharge: April 10  Discharge diagnosis: TIA  Hospital course: presented with dizziness, fatigue, off balance, checked her BP was 188/114. Hx of TIA 2015. + headache, palpitations, nausea, generalized weakness,  2 weeks prior had similar symptoms and admitted to [facility] had MRI, MRA - normal   Recent stress test by cards Dr Katrinka Blazing normal  Prior hospitalization within past 30 days: Yes   Prior hospitalization within past 6 months: No  Comorbid conditions: hypertension  Medical procedure(s) performed: none  Surgical procedure(s) performed: none  Diagnostic tests performed: CT head, MRI brain and MRA  Pending tests: none  Home care: ADL's intact  Specialist follow-up: cardiology  Post-hospital monitoring: ambulatory BP monitoring  Mobility status: normal mobility     BP since d/c reported measurements 156/107 HR   121/87, 102  bp fluctuates.     Feels nauseated and dizziness today.    Atenolol was changed to coreg in [facility].   Felt ok on atenolol  On coreg x 3 weeks, felt very tired, and was d/c while at [facility]    HR "feels fast"  Seeing cards 5/1    Patient Care Team:  Oneita Jolly, MD as PCP - General (Internal Medicine)  Donley Redder Tyrone Nine, MD as Consulting Physician (Cardiology)  Janace Litten, MD as Consulting Physician (Cardiology)  Darnelle Maffucci, MD as Consulting Physician (Cardiology)  Fausto Skillern, [place]  Burton Apley, MD as Consulting Physician (Gastroenterology)  Brett Canales, MD as Consulting Physician (Gastroenterology)  Karrie Doffing, RD as Dietitian  (Dietician)  Karrie Doffing, RD as Dietitian (Dietician)  Karrie Doffing, RD as Dietitian (Dietician)  Renard Hamper, MD as Consulting Physician (Clinical Cardiac Electrophysiology)  Michele Mcalpine, RN as Registered Nurse  Theressa Stamps, RN as Registered Nurse    Medication Reconciliation(required):  Hospital discharge medications reviewed and reconciled with current medication list. Patient is adherent with hospital discharge medication regimen.    Advanced Directives:  Not addressed today.    Problem List:  Patient Active Problem List   Diagnosis   . Essential hypertension   . Anxiety state, unspecified   . Abnormal electrocardiogram   . Cerebral infarction   . Gastroesophageal reflux disease   . Hypernatremia   . Paresthesia   . Slow transit constipation   . Vitamin D deficiency   . Overactive bladder   . S/P laparoscopic cholecystectomy   . Transient cerebral ischemia, unspecified type   . Palpitations   . Pituitary microadenoma   . Dyslipidemia   . Other long term (current) drug therapy   . Headache   . History of spinal surgery   . Obstructive sleep apnea syndrome   . Osteoarthritis of knees, bilateral   . Pre-op evaluation   . Bariatric surgery status   . Nutritional deficiency   . Anxiety   . Epigastric abdominal tenderness without rebound tenderness   . Other chest pain   . Abdominal pain   . Chest pain  Current Medications:  Current Outpatient Prescriptions   Medication Sig Dispense Refill   . albuterol (PROVENTIL HFA;VENTOLIN HFA) 108 (90 BASE) MCG/ACT inhaler Inhale into the lungs every 4 (four) hours as needed.        Marland Kitchen amLODIPine (NORVASC) 2.5 MG tablet Take 1 tablet (2.5 mg total) by mouth daily 90 tablet 1   . aspirin EC 81 MG EC tablet Take 1 tablet (81 mg total) by mouth daily. 30 tablet 1   . B Complex Vitamins (VITAMIN-B COMPLEX) Tab Take  by Mouth Once in the morning     . cholecalciferol (VITAMIN D-1000 MAX ST) 1000 units tablet Take 1,000 Units by Mouth Once a Day at lunch time      . diazePAM (VALIUM) 2 MG tablet Take 1 tablet (2 mg total) by mouth every 8 (eight) hours as needed for Anxiety. 30 tablet 2   . fluticasone (FLONASE) 50 MCG/ACT nasal spray 2 sprays by Nasal route daily. (Patient taking differently: 2 sprays by Nasal route as needed.   ) 16 g 2   . lidocaine (LIDODERM) 5 % Place 1 patch onto the skin daily.Remove & Discard patch within 12 hours or as directed by MD 15 each 0   . pantoprazole (PROTONIX) 40 MG tablet TAKE 1 TABLET(40 MG) BY MOUTH DAILY (Patient taking differently: TAKE 1 TABLET(40 MG) BY MOUTH IN THE MORNING) 90 tablet 0   . traZODone (DESYREL) 50 MG tablet Take 1 tablet (50 mg total) by mouth nightly. 30 tablet 1   . valACYclovir HCL (VALTREX) 500 MG tablet TAKE 1 TABLET(500 MG) BY MOUTH DAILY (Patient taking differently: TAKE 1 TABLET(500 MG) BY MOUTH DAILY PRN) 90 tablet 0     No current facility-administered medications for this visit.        Allergies:  Allergies   Allergen Reactions   . Acyclovir Nausea And Vomiting     Other reaction(s): gi distress   . Amoxicillin-Pot Clavulanate      Other reaction(s): gi distress     . Aspirin Nausea And Vomiting     Patient states "it upsets my stomach"  Other reaction(s): gi distress  Upsets stomach  Able to tolerate enteric coated aspirin   . Atorvastatin        Palpitation   . Lovastatin Nausea And Vomiting     Other reaction(s): gi distress   . Metronidazole Nausea And Vomiting     Other reaction(s): gi distress   . Moxifloxacin Nausea And Vomiting         GI symptoms   . Other Nausea And Vomiting   . Rosuvastatin      Palpitation, chest pain, abd pain    . Statins    . Sulfa Antibiotics Nausea And Vomiting     Other reaction(s): gi distress       Past Medical History:  Past Medical History:   Diagnosis Date   . Asthma    . Chest pain 2016     Chest pain after eating x6  months--being followed by GI for GERD   . Constipation    . Difficulty walking     amb with cane secondary to arthritis bil knees   . Genital  herpes     no current outbreak (10/03/15)   . GERD (gastroesophageal reflux disease)    . Hiatal hernia     h/o surgery   . Hyperlipidemia    . Hypertensive disorder     Well controlled on med. Denies any SOB  in last 6 months (10/03/15)   . Low back pain    . Morbid obesity with BMI of 40.0-44.9, adult     BMI 40.9   . Nausea without vomiting    . OSA on CPAP 2015    CPAP nightly x 1 yr   . Osteoarthritis of knees, bilateral     and back   . TIA (transient ischemic attack) 2014    TIA 2014-no residual. Patient evaulated for possible TIA 07/12/2015  @ [facility] (dischage summary in epic)-per summary CT of head was normal and patient was d/c'd       Past Surgical History:  Past Surgical History:   Procedure Laterality Date   . APPENDECTOMY  >25 yrs   . CHOLECYSTECTOMY     . EGD  01/2015   . EGD, BIOPSY N/A 09/15/2017    Procedure: EGD, BIOPSY;  Surgeon: Pershing Proud, MD;  Location: [facility];  Service: Gastroenterology;  Laterality: N/A;   . fallopian tube surgery  >25 yrs   . INSERTION, PAIN PUMP (MEDICAL) N/A 10/22/2015    Procedure: INSERTION, PAIN PUMP (MEDICAL);  Surgeon: Nicola Police, DO;  Location: [facility];  Service: General;  Laterality: N/A;   . LAPAROSCOPIC, CHOLECYSTECTOMY, CHOLANGIOGRAM  08/13/2014   . LAPAROSCOPIC, GASTRIC BYPASS N/A 10/22/2015    Procedure: LAPAROSCOPIC, GASTRIC BYPASS;  Surgeon: Josefa Half R, DO;  Location: [facility];  Service: General;  Laterality: N/A;   . LAPAROSCOPIC, HERNIORRHAPHY, HIATAL N/A 10/22/2015    Procedure: LAPAROSCOPIC, HERNIORRHAPHY, HIATAL;  Surgeon: Jeanell Sparrow, Hamid R, DO;  Location: [facility];  Service: General;  Laterality: N/A;   . OVARY SURGERY Right >25 yrs   . SPINE SURGERY  11/2013    cervical HNP x 2, Dr. Bufford Buttner       Family History:  Family History   Problem Relation Age of Onset   . Cancer Mother 64        breast cancer   . Breast cancer Mother    . Heart disease Father    . Malignant hyperthermia Neg Hx    .  Pseudochol deficiency Neg Hx        Social History:  Social History     Social History   . Marital status: Single     Spouse name: N/A   . Number of children: 2   . Years of education: N/A     Occupational History   . legal instrument examiner [employer]      Social History Main Topics   . Smoking status: Never Smoker   . Smokeless tobacco: Never Used   . Alcohol use No   . Drug use: No   . Sexual activity: No     Other Topics Concern   . Dietary Supplements / Vitamins Yes   . Anesthesia Problems No   . Blood Thinners No   . Eats Large Amounts No   . Excessive Sweets No   . Skips Meals No   . Eats Excessive Starches Yes   . Snacks Or Grazes No   . Emotional Eater Yes   . Eats Fried Food Yes   . Eats Fast Food Yes   . Diet Center No   . Hmr No   . [name] No   . La Weight Loss No   . Nutri-System No   . Opti-Fast / Medi-Fast No   . Overeaters Anonymous No   . [facility] Weight Loss  Center No   . Tops No   . Weight Watchers No   . Atkins No   . Binging / Purging No   . Body For Life No   . Cabbage Soup No   . Calorie Counting No   . Fasting No   . [name] No   . Health Spa No   . Herbal Life No   . High Protein No   . Low Carb No   . Low Fat No   . Mayo [facility] Diet No   . [place] Diet No   . Margie Billet Diet No   . [place] Diet No   . Slim Fast No   . [place] No   . Sugar Busters No   . Vomiting No   . Zone Diet No   . Stationary Cycle Or Treadmill No   . Gym/Fitness Classes No   . Home Exercise/Video No   . Swimming No   . Team Sports No   . Weight Training No   . Walking Or Running No   . Hospitalization No   . Hypnosis No   . Physical Therapy No   . Psychological Therapy No   . Residential Program No   . Acutrim No   . Amphetamines No   . Anorex No   . Byetta No   . Dexatrim No   . Didrex No   . Fastin No   . Fen - Phen No   . Ionamin / Adipex No   . Mazanor No   . Meridia No   . Obalan No   . Phendiet No   . Phentrol No   . Phenteramine Yes   . Plegine No   . Pondimin No   . Qsymia  No   . Prozac No   . Redux No   . Sanorex No   . Tenuate No   . Tepanole No   . Wechless No   . Wellbutrin No   . Xenical (Orlistat, Alli) No   . Other Med No   . No Impairment Yes     Chronic Bilat Knee Pain   . Walks With Cane/Crutch Yes   . Requires A Wheelchair No   . Bedridden No   . Are You Currently Being Treated For Depression? No   . Do You Snore? Yes   . Are You Receiving Any Medical Or Psychological Services? No   . Do You Have Or Have You Been Treated For An Eating Disorder? No   . Do You Exercise Regularly? No   . Have You Or Family Member Ever Have Trouble With Anesthesia? No     Social History Narrative   . No narrative on file       The following sections were reviewed this encounter by the provider:         ROS:  Review of Systems   Constitutional: Negative for chills and fever.   Eyes: Negative for visual disturbance.   Respiratory: Negative for shortness of breath.    Cardiovascular: Negative for chest pain and leg swelling.   Gastrointestinal: Positive for nausea. Negative for vomiting.   Genitourinary: Negative.  Negative for difficulty urinating.   Musculoskeletal: Negative.    Neurological: Positive for dizziness. Negative for headaches.       Vitals:  BP 143/84 Comment: LA  Pulse 79   Temp 98.1 F (36.7 C) (Oral)   Resp 16   Wt 60.1 kg (132 lb 6.4  oz)   BMI 25.02 kg/m     Objective:     Physical Exam:  Physical Exam   Neck: Neck supple. Carotid bruit is not present. No thyroid mass and no thyromegaly present.   Cardiovascular: Normal rate, regular rhythm, S1 normal and S2 normal.  Exam reveals no gallop and no friction rub.    No murmur heard.  There is no peripheral edema noted in the lower extremities.      Pulmonary/Chest: Effort normal and breath sounds normal. She has no decreased breath sounds. She has no wheezes. She has no rhonchi. She has no rales.   Lymphadenopathy:     She has no cervical adenopathy.        Right: No supraclavicular adenopathy present.        Left: No  supraclavicular adenopathy present.       Assessment:     1. Essential hypertension  Increase amlodipine from2.5 to 5 mg  Pt had severe fatigue on coreg - holding beta blocker at this time  Monitor BP home  Will return in 2 weeks for f/u  Sodium <2 g/day  - amLODIPine (NORVASC) 5 MG tablet; Take 1 tablet (5 mg total) by mouth daily  Dispense: 90 tablet; Refill: 0    2. Transient cerebral ischemia, unspecified type  Cont asa 81 mg    3. Dyslipidemia  Not on statin due to adverse reactions to multiple statins  Lab Results   Component Value Date    CHOL 151 01/25/2018    CHOL 175 11/10/2017    CHOL 187 04/30/2017    CHOL 169 11/09/2016    CHOL 168 04/09/2016    CHOL 255 (H) 09/25/2015    HDL 53 01/25/2018    HDL 72 11/10/2017    HDL 90 04/30/2017    HDL 70 11/09/2016    HDL 61 04/09/2016    HDL 64 09/25/2015    LDL 93 01/25/2018    LDL 89 11/10/2017    LDL 83 04/30/2017    LDL 86 11/09/2016    LDL 93 04/09/2016    LDL 178 (H) 09/25/2015    TRIG 26 (L) 01/25/2018    TRIG 53 11/10/2017    TRIG 55 04/30/2017    TRIG 51 11/09/2016    TRIG 68 04/09/2016    TRIG 63 09/25/2015     4. Palpitations      Plan:   Disease/Illness Education:  Patient educated regarding symptom management, adherence to current medication regimen and current plan of care. Patient encouraged to contact us with any questions or needs.  Patient agreeable to current plan of care and verbalizes understanding of information provided.  Home Health Needs:  No current home health services needed at this time.  Community Service Needs:  No community services needed at this time.  Establishment of Referral Orders:  No referrals needed at this time.  Communication with Health Care Providers:  No active communication with additional health care providers needed at this time.  Caro 90+ day Treatment Plan:  Not applicable    Transitional Care Management Coding Criteria:  2) Face-to-face visit occurred within 7 days of discharge.  3) Complexity of medical decision  making is moderate.  TCM coding criteria not met.    Return in about 2 weeks (around 02/25/2018). with PCP Dr Daivd Council  Seeing cards 5/1    Jules Husbands, DO

## 2018-02-11 NOTE — Progress Notes (Signed)
Have you seen any specialists/other providers since your last visit with [place]?    No    Arm preference verified?   No    The patient is due for mammogram, depression screening, falls risk screening, shingles vaccine, pneumonia vaccine and MAWV, hep c vaccine, DXA, care plan letter

## 2018-02-13 ENCOUNTER — Encounter (INDEPENDENT_AMBULATORY_CARE_PROVIDER_SITE_OTHER): Payer: Self-pay | Admitting: Family Medicine

## 2018-02-14 ENCOUNTER — Encounter (INDEPENDENT_AMBULATORY_CARE_PROVIDER_SITE_OTHER): Payer: Self-pay | Admitting: Internal Medicine

## 2018-02-16 ENCOUNTER — Ambulatory Visit (INDEPENDENT_AMBULATORY_CARE_PROVIDER_SITE_OTHER): Payer: Medicare Other | Admitting: Family Medicine

## 2018-02-16 ENCOUNTER — Encounter (INDEPENDENT_AMBULATORY_CARE_PROVIDER_SITE_OTHER): Payer: Self-pay | Admitting: Family Medicine

## 2018-02-16 ENCOUNTER — Telehealth (INDEPENDENT_AMBULATORY_CARE_PROVIDER_SITE_OTHER): Payer: Self-pay | Admitting: Internal Medicine

## 2018-02-16 VITALS — BP 138/81 | HR 81 | Temp 97.8°F | Resp 20 | Wt 130.0 lb

## 2018-02-16 DIAGNOSIS — R002 Palpitations: Secondary | ICD-10-CM

## 2018-02-16 DIAGNOSIS — I10 Essential (primary) hypertension: Secondary | ICD-10-CM

## 2018-02-16 MED ORDER — ATENOLOL 50 MG PO TABS
50.0000 mg | ORAL_TABLET | Freq: Every day | ORAL | 0 refills | Status: DC
Start: 2018-02-16 — End: 2018-04-27

## 2018-02-16 NOTE — Progress Notes (Signed)
Have you seen any specialists/other providers since your last visit with [place]?    No    Arm preference verified?   No    The patient is due for mammogram, depression screening, falls risk screening, shingles vaccine, pneumonia vaccine and care plan letter MAWV, hep c screening, DXA

## 2018-02-16 NOTE — Progress Notes (Signed)
Subjective:    Date: 02/16/2018 1:35 AM   Patient ID: Amber [name] is a 67 y.o. female.    Chief Complaint   Patient presents with   . Allergic Reaction     the patient states that she thinks she my be having a reaction to her BP medication, abdominal pain, dizziness, palpitaions       HPI  Heart palpitations, nausea, abdominal pain,   BP 121-144/89-100 HR 84-102    hctz - had low potassium    Metoprolol - pt reports she had side effects unspecified    Did okay on atenolol    Rare heartburn    Patient Active Problem List    Diagnosis Date Noted   . Chest pain 01/24/2018   . Other chest pain 10/13/2016   . Abdominal pain 10/13/2016   . Epigastric abdominal tenderness without rebound tenderness 09/08/2016   . Anxiety 06/25/2016   . Nutritional deficiency 04/17/2016   . Bariatric surgery status 10/23/2015   . Pre-op evaluation 10/03/2015   . Osteoarthritis of knees, bilateral 09/20/2015   . Headache 07/12/2015   . Dyslipidemia 03/21/2015   . Palpitations 02/12/2015   . Transient cerebral ischemia, unspecified type 11/07/2014   . S/P laparoscopic cholecystectomy 08/27/2014   . Other long term (current) drug therapy 08/12/2014   . Slow transit constipation 07/27/2014   . Obstructive sleep apnea syndrome 05/01/2014   . Anxiety state, unspecified 03/01/2014   . Vitamin D deficiency 02/19/2014   . Overactive bladder 02/06/2014   . Abnormal electrocardiogram 01/08/2014   . Essential hypertension 11/21/2013   . History of spinal surgery 11/21/2013   . Hypernatremia 11/11/2013   . Paresthesia 11/11/2013   . Cerebral infarction 04/15/2013   . Gastroesophageal reflux disease 04/02/2013   . Pituitary microadenoma 11/02/2004     Overview:   Prolactinoma, pituitary microadenoma.   Corpus callosum lipoma  See 08/25/11 hospital admit, CT, MRI, neurology and neurosurgery consult.  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 The carpus callosum is  only mildly hypoplastic along with  superior border. Pericallosal  lipomas are rare and typically discovered incidentally on  examination. No specific treatment is typically required. This  was discussed with Dr. Kaylyn Lim, [facility] neurologist, who  suggested that the patient should be seen by neurosurgeon. A  neurosurgery evaluation was requested and they suggested that  there is no need for any intervention at this time"       Patient Care Team:  Oneita Jolly, MD as PCP - General (Internal Medicine)  Donley Redder Tyrone Nine, MD as Consulting Physician (Cardiology)  Janace Litten, MD as Consulting Physician (Cardiology)  Darnelle Maffucci, MD as Consulting Physician (Cardiology)  Fausto Skillern, [place]  Burton Apley, MD as Consulting Physician (Gastroenterology)  Brett Canales, MD as Consulting Physician (Gastroenterology)  Karrie Doffing, RD as Dietitian (Dietician)  Karrie Doffing, RD as Dietitian (Dietician)  Karrie Doffing, RD as Dietitian (Dietician)  Renard Hamper, MD as Consulting Physician (Clinical Cardiac Electrophysiology)  Michele Mcalpine, RN as Registered Nurse  Theressa Stamps, RN as Registered Nurse  Immunization History   Administered Date(s) Administered   . Influenza quadrivalent (IM) PF 3 Yrs & greater 11/03/2016     Current Outpatient Prescriptions   Medication Sig Dispense Refill   . albuterol (PROVENTIL HFA;VENTOLIN HFA) 108 (90 BASE) MCG/ACT inhaler Inhale into the lungs every 4 (four) hours as needed.        Marland Kitchen  aspirin EC 81 MG EC tablet Take 1 tablet (81 mg total) by mouth daily. 30 tablet 1   . B Complex Vitamins (VITAMIN-B COMPLEX) Tab Take  by Mouth Once in the morning     . cholecalciferol (VITAMIN D-1000 MAX ST) 1000 units tablet Take 1,000 Units by Mouth Once a Day at lunch time     . diazePAM (VALIUM) 2 MG tablet Take 1 tablet (2 mg total) by mouth every 8 (eight) hours as needed for Anxiety. 30 tablet 2   . fluticasone (FLONASE) 50 MCG/ACT nasal spray 2 sprays by Nasal route daily. (Patient  taking differently: 2 sprays by Nasal route as needed.   ) 16 g 2   . lidocaine (LIDODERM) 5 % Place 1 patch onto the skin daily.Remove & Discard patch within 12 hours or as directed by MD 15 each 0   . pantoprazole (PROTONIX) 40 MG tablet TAKE 1 TABLET(40 MG) BY MOUTH DAILY 90 tablet 0   . valACYclovir HCL (VALTREX) 500 MG tablet TAKE 1 TABLET(500 MG) BY MOUTH DAILY (Patient taking differently: TAKE 1 TABLET(500 MG) BY MOUTH DAILY PRN) 90 tablet 0   . atenolol (TENORMIN) 50 MG tablet Take 1 tablet (50 mg total) by mouth daily 90 tablet 0     No current facility-administered medications for this visit.      Allergies   Allergen Reactions   . Acyclovir Nausea And Vomiting     Other reaction(s): gi distress   . Amoxicillin-Pot Clavulanate      Other reaction(s): gi distress     . Aspirin Nausea And Vomiting     Patient states "it upsets my stomach"  Other reaction(s): gi distress  Upsets stomach  Able to tolerate enteric coated aspirin   . Atorvastatin        Palpitation   . Lovastatin Nausea And Vomiting     Other reaction(s): gi distress   . Metronidazole Nausea And Vomiting     Other reaction(s): gi distress   . Moxifloxacin Nausea And Vomiting         GI symptoms   . Other Nausea And Vomiting   . Rosuvastatin      Palpitation, chest pain, abd pain    . Statins    . Sulfa Antibiotics Nausea And Vomiting     Other reaction(s): gi distress       Past Medical History:   Diagnosis Date   . Asthma    . Chest pain 2016     Chest pain after eating x6  months--being followed by GI for GERD   . Constipation    . Difficulty walking     amb with cane secondary to arthritis bil knees   . Genital herpes     no current outbreak (10/03/15)   . GERD (gastroesophageal reflux disease)    . Hiatal hernia     h/o surgery   . Hyperlipidemia    . Hypertensive disorder     Well controlled on med. Denies any SOB in last 6 months (10/03/15)   . Low back pain    . Morbid obesity with BMI of 40.0-44.9, adult     BMI 40.9   . Nausea without  vomiting    . OSA on CPAP 2015    CPAP nightly x 1 yr   . Osteoarthritis of knees, bilateral     and back   . TIA (transient ischemic attack) 2014    TIA 2014-no residual. Patient evaulated for possible TIA  07/12/2015  @ [facility] (dischage summary in epic)-per summary CT of head was normal and patient was d/c'd     Past Surgical History:   Procedure Laterality Date   . APPENDECTOMY  >25 yrs   . CHOLECYSTECTOMY     . EGD  01/2015   . EGD, BIOPSY N/A 09/15/2017    Procedure: EGD, BIOPSY;  Surgeon: Pershing Proud, MD;  Location: [facility];  Service: Gastroenterology;  Laterality: N/A;   . fallopian tube surgery  >25 yrs   . INSERTION, PAIN PUMP (MEDICAL) N/A 10/22/2015    Procedure: INSERTION, PAIN PUMP (MEDICAL);  Surgeon: Nicola Police, DO;  Location: [place] [facility];  Service: General;  Laterality: N/A;   . LAPAROSCOPIC, CHOLECYSTECTOMY, CHOLANGIOGRAM  08/13/2014   . LAPAROSCOPIC, GASTRIC BYPASS N/A 10/22/2015    Procedure: LAPAROSCOPIC, GASTRIC BYPASS;  Surgeon: Josefa Half R, DO;  Location: [place] [facility];  Service: General;  Laterality: N/A;   . LAPAROSCOPIC, HERNIORRHAPHY, HIATAL N/A 10/22/2015    Procedure: LAPAROSCOPIC, HERNIORRHAPHY, HIATAL;  Surgeon: Jeanell Sparrow, Hamid R, DO;  Location: [place] [facility];  Service: General;  Laterality: N/A;   . OVARY SURGERY Right >25 yrs   . SPINE SURGERY  11/2013    cervical HNP x 2, Dr. Bufford Buttner     Family History   Problem Relation Age of Onset   . Cancer Mother 27        breast cancer   . Breast cancer Mother    . Heart disease Father    . Malignant hyperthermia Neg Hx    . Pseudochol deficiency Neg Hx      Social History     Social History   . Marital status: Single     Spouse name: N/A   . Number of children: 2   . Years of education: N/A     Occupational History   . legal instrument examiner [employer]      Social History Main Topics   . Smoking status: Never Smoker   . Smokeless tobacco: Never Used   . Alcohol use No   . Drug use: No   .  Sexual activity: No     Other Topics Concern   . Dietary Supplements / Vitamins Yes   . Anesthesia Problems No   . Blood Thinners No   . Eats Large Amounts No   . Excessive Sweets No   . Skips Meals No   . Eats Excessive Starches Yes   . Snacks Or Grazes No   . Emotional Eater Yes   . Eats Fried Food Yes   . Eats Fast Food Yes   . Diet Center No   . Hmr No   . [name] No   . La Weight Loss No   . Nutri-System No   . Opti-Fast / Medi-Fast No   . Overeaters Anonymous No   . Physicians Weight Loss Center No   . Tops No   . Weight Watchers No   . Atkins No   . Binging / Purging No   . Body For Life No   . Cabbage Soup No   . Calorie Counting No   . Fasting No   . [name] No   . Health Spa No   . Herbal Life No   . High Protein No   . Low Carb No   . Low Fat No   . Mayo [facility] Diet No   . [place] Diet No   .  Richard Sharol Harness Diet No   . [place] Diet No   . Slim Fast No   . [place] No   . Sugar Busters No   . Vomiting No   . Zone Diet No   . Stationary Cycle Or Treadmill No   . Gym/Fitness Classes No   . Home Exercise/Video No   . Swimming No   . Team Sports No   . Weight Training No   . Walking Or Running No   . Hospitalization No   . Hypnosis No   . Physical Therapy No   . Psychological Therapy No   . Residential Program No   . Acutrim No   . Amphetamines No   . Anorex No   . Byetta No   . Dexatrim No   . Didrex No   . Fastin No   . Fen - Phen No   . Ionamin / Adipex No   . Mazanor No   . Meridia No   . Obalan No   . Phendiet No   . Phentrol No   . Phenteramine Yes   . Plegine No   . Pondimin No   . Qsymia No   . Prozac No   . Redux No   . Sanorex No   . Tenuate No   . Tepanole No   . Wechless No   . Wellbutrin No   . Xenical (Orlistat, Alli) No   . Other Med No   . No Impairment Yes     Chronic Bilat Knee Pain   . Walks With Cane/Crutch Yes   . Requires A Wheelchair No   . Bedridden No   . Are You Currently Being Treated For Depression? No   . Do You Snore? Yes   . Are You Receiving Any Medical Or  Psychological Services? No   . Do You Have Or Have You Been Treated For An Eating Disorder? No   . Do You Exercise Regularly? No   . Have You Or Family Member Ever Have Trouble With Anesthesia? No     Social History Narrative   . No narrative on file     The following portions of the patient's history were reviewed and updated as appropriate: allergies, current medications, past family history, past medical history, past social history, past surgical history and problem list.    Review of Systems   Constitutional: Negative for chills and fever.   Respiratory: Negative for cough and shortness of breath.    Cardiovascular: Positive for palpitations. Negative for chest pain.   Gastrointestinal: Positive for abdominal pain and nausea. Negative for constipation and diarrhea.   Neurological: Negative for headaches.        Objective:   BP 138/81   Pulse 81   Temp 97.8 F (36.6 C) (Oral)   Resp 20   Wt 59 kg (130 lb)   BMI 24.56 kg/m   Wt Readings from Last 3 Encounters:   02/16/18 59 kg (130 lb)   02/11/18 60.1 kg (132 lb 6.4 oz)   02/01/18 59.9 kg (132 lb)     Physical Exam   Constitutional: She appears well-developed and well-nourished. No distress.   Neck: Neck supple. Carotid bruit is not present. No thyroid mass and no thyromegaly present.   Cardiovascular: Normal rate, regular rhythm, S1 normal and S2 normal.    No murmur heard.  There is no peripheral edema noted in the lower extremities.      Pulmonary/Chest: Effort normal  and breath sounds normal. She has no decreased breath sounds. She has no wheezes. She has no rhonchi. She has no rales.   Abdominal: Soft. Normal appearance and bowel sounds are normal. She exhibits no distension. There is no hepatosplenomegaly. There is no tenderness. There is no rebound and no guarding.   Lymphadenopathy:     She has no cervical adenopathy.        Right: No supraclavicular adenopathy present.        Left: No supraclavicular adenopathy present.   Skin: Skin is warm, dry  and intact. She is not diaphoretic.   Psychiatric: She has a normal mood and affect.   Nursing note and vitals reviewed.       Assessment/Plan:       1. Essential hypertension  History of adverse effects to multiple anti-HTN medications  D/c amlodipine  Start atenolol  - atenolol (TENORMIN) 50 MG tablet; Take 1 tablet (50 mg total) by mouth daily  Dispense: 90 tablet; Refill: 0  Has appt with Dr Daivd Council 4/30 and cards Dr. Katrinka Blazing 5/1    2. Palpitations  - atenolol (TENORMIN) 50 MG tablet; Take 1 tablet (50 mg total) by mouth daily  Dispense: 90 tablet; Refill: 0      Jules Husbands, DO

## 2018-02-16 NOTE — Telephone Encounter (Signed)
TC with pt .  She did not tolerate norvasc, increased palpitations. Was seen earlier today by Dr. Nechama Guard, norvasc d/c'd, and put back on atenolol 50mg   Has ov to see card 03/02/18.     Rec:  1. Continue atenolol  2. Monitor HR, BP,   3. Keep f/u ov in few weeks

## 2018-02-19 ENCOUNTER — Encounter (INDEPENDENT_AMBULATORY_CARE_PROVIDER_SITE_OTHER): Payer: Self-pay | Admitting: Family Medicine

## 2018-03-01 ENCOUNTER — Encounter (INDEPENDENT_AMBULATORY_CARE_PROVIDER_SITE_OTHER): Payer: Self-pay | Admitting: Internal Medicine

## 2018-03-01 ENCOUNTER — Ambulatory Visit (INDEPENDENT_AMBULATORY_CARE_PROVIDER_SITE_OTHER): Payer: Medicare Other | Admitting: Internal Medicine

## 2018-03-01 VITALS — BP 146/89 | HR 67 | Temp 98.0°F | Ht 61.0 in | Wt 130.8 lb

## 2018-03-01 DIAGNOSIS — K219 Gastro-esophageal reflux disease without esophagitis: Secondary | ICD-10-CM

## 2018-03-01 DIAGNOSIS — R0989 Other specified symptoms and signs involving the circulatory and respiratory systems: Secondary | ICD-10-CM

## 2018-03-01 DIAGNOSIS — R1310 Dysphagia, unspecified: Secondary | ICD-10-CM

## 2018-03-01 DIAGNOSIS — G459 Transient cerebral ischemic attack, unspecified: Secondary | ICD-10-CM

## 2018-03-01 DIAGNOSIS — R002 Palpitations: Secondary | ICD-10-CM

## 2018-03-01 DIAGNOSIS — I10 Essential (primary) hypertension: Secondary | ICD-10-CM

## 2018-03-01 NOTE — Progress Notes (Signed)
Subjective:       Patient ID: Amber [name] is a 67 y.o. female.    HPI  Pt is here for follow up     1. HTN, recent ED visit at [facility] for elevated BP, cp, labs, EKG neg,   occ palpitations, Home BP fluctuating 100-140's/70-90's, has ov to see card tomorrow.   ED MD suggested neuro evaluation to r/o vagus nerve problem. Pt requests referral to neuro  Normal CT abd 2018, normal adrenals.   Recent normal TSH    2. GERD, occ sx. Compliant with pantoprazole, +dysphagia, last saw GI, [name] >42yr ago.   3. Denies anxiety, rare use of valium. Denies depressed mood.       The following portions of the patient's history were reviewed and updated as appropriate: allergies, current medications, past family history, past medical history, past social history, past surgical history and problem list.    Review of Systems   Constitutional: Negative for appetite change and unexpected weight change.   Respiratory: Negative for shortness of breath.    Cardiovascular: Positive for chest pain and palpitations.   Gastrointestinal: Positive for abdominal pain. Negative for constipation, diarrhea, nausea and vomiting.   Genitourinary: Negative for dysuria.   Neurological: Positive for dizziness. Negative for syncope and headaches.     BP 146/89   Pulse 67   Temp 98 F (36.7 C) (Oral)   Ht 1.549 m (5\' 1" )   Wt 59.3 kg (130 lb 12.8 oz)   BMI 24.71 kg/m        Objective:    Physical Exam   Constitutional: She appears well-developed. No distress.   HENT:   Mouth/Throat: Oropharynx is clear and moist. No oropharyngeal exudate.   Eyes: Conjunctivae are normal. No scleral icterus.   Neck: Neck supple. No JVD present. Carotid bruit is not present. No thyromegaly present.   Cardiovascular: Normal rate, regular rhythm and normal heart sounds.  Exam reveals no gallop and no friction rub.    No murmur heard.  Pulmonary/Chest: Effort normal and breath sounds normal. No respiratory distress. She has no wheezes. She has no rales.   Abdominal:  Soft. Bowel sounds are normal. She exhibits no distension and no mass. There is tenderness in the epigastric area. There is no rebound and no guarding.   Musculoskeletal: She exhibits no edema.   Neurological: She is alert.           Assessment:       1. Palpitation  Ambulatory referral to Neurology    Metanephrines, urine, 24 hour    Catecholamines, fract, urine, 24 hour    VMA, urine, 24 hour    5-HIAA and Creatinine,24HR Urine   2. Essential hypertension  Ambulatory referral to Neurology   3. TIA (transient ischemic attack)  Ambulatory referral to Neurology   4. Labile blood pressure  Metanephrines, urine, 24 hour    Catecholamines, fract, urine, 24 hour    VMA, urine, 24 hour    5-HIAA and Creatinine,24HR Urine   5. Gastroesophageal reflux disease, esophagitis presence not specified  Ambulatory referral to Gastroenterology   6. Dysphagia, unspecified type  Ambulatory referral to Gastroenterology          Plan:      Procedures  No orders of the defined types were placed in this encounter.      Labs ordered  D/w pt trial of SSRI, pt declined  Try taking valium prn for sx.  Referred pt to neuro, Dr. Berna Bue  for evaluation  Keep ov with card as scheduled  F/u with GI  Monitor home BP, HR  F/u 4 weeks.

## 2018-03-02 ENCOUNTER — Ambulatory Visit (INDEPENDENT_AMBULATORY_CARE_PROVIDER_SITE_OTHER): Payer: Medicare Other | Admitting: Cardiology

## 2018-03-03 ENCOUNTER — Other Ambulatory Visit (INDEPENDENT_AMBULATORY_CARE_PROVIDER_SITE_OTHER): Payer: Self-pay | Admitting: Internal Medicine

## 2018-03-08 ENCOUNTER — Emergency Department
Admission: EM | Admit: 2018-03-08 | Discharge: 2018-03-08 | Disposition: A | Discharge status: Home / Self Care | Payer: Medicare Other | Attending: Emergency Medicine | Admitting: Emergency Medicine

## 2018-03-08 ENCOUNTER — Emergency Department: Payer: Medicare Other

## 2018-03-08 ENCOUNTER — Other Ambulatory Visit: Payer: Medicare Other

## 2018-03-08 DIAGNOSIS — Z9049 Acquired absence of other specified parts of digestive tract: Secondary | ICD-10-CM | POA: Insufficient documentation

## 2018-03-08 DIAGNOSIS — Z7951 Long term (current) use of inhaled steroids: Secondary | ICD-10-CM | POA: Insufficient documentation

## 2018-03-08 DIAGNOSIS — Z7982 Long term (current) use of aspirin: Secondary | ICD-10-CM | POA: Insufficient documentation

## 2018-03-08 DIAGNOSIS — K219 Gastro-esophageal reflux disease without esophagitis: Secondary | ICD-10-CM | POA: Insufficient documentation

## 2018-03-08 DIAGNOSIS — Z79899 Other long term (current) drug therapy: Secondary | ICD-10-CM | POA: Insufficient documentation

## 2018-03-08 DIAGNOSIS — G4733 Obstructive sleep apnea (adult) (pediatric): Secondary | ICD-10-CM | POA: Insufficient documentation

## 2018-03-08 DIAGNOSIS — Z8673 Personal history of transient ischemic attack (TIA), and cerebral infarction without residual deficits: Secondary | ICD-10-CM | POA: Insufficient documentation

## 2018-03-08 DIAGNOSIS — I10 Essential (primary) hypertension: Secondary | ICD-10-CM | POA: Insufficient documentation

## 2018-03-08 DIAGNOSIS — Z9884 Bariatric surgery status: Secondary | ICD-10-CM | POA: Insufficient documentation

## 2018-03-08 DIAGNOSIS — J45909 Unspecified asthma, uncomplicated: Secondary | ICD-10-CM | POA: Insufficient documentation

## 2018-03-08 DIAGNOSIS — R1013 Epigastric pain: Secondary | ICD-10-CM | POA: Insufficient documentation

## 2018-03-08 LAB — CBC AND DIFFERENTIAL
Absolute NRBC: 0 10*3/uL (ref 0.00–0.00)
Basophils Absolute Automated: 0.03 10*3/uL (ref 0.00–0.08)
Basophils Automated: 0.7 %
Eosinophils Absolute Automated: 0.06 10*3/uL (ref 0.00–0.44)
Eosinophils Automated: 1.3 %
Hematocrit: 39.6 % (ref 34.7–43.7)
Hgb: 13 g/dL (ref 11.4–14.8)
Immature Granulocytes Absolute: 0.01 10*3/uL (ref 0.00–0.07)
Immature Granulocytes: 0.2 %
Lymphocytes Absolute Automated: 1.5 10*3/uL (ref 0.42–3.22)
Lymphocytes Automated: 32.8 %
MCH: 28.9 pg (ref 25.1–33.5)
MCHC: 32.8 g/dL (ref 31.5–35.8)
MCV: 88 fL (ref 78.0–96.0)
MPV: 10.2 fL (ref 8.9–12.5)
Monocytes Absolute Automated: 0.45 10*3/uL (ref 0.21–0.85)
Monocytes: 9.8 %
Neutrophils Absolute: 2.53 10*3/uL (ref 1.10–6.33)
Neutrophils: 55.2 %
Nucleated RBC: 0 /100 WBC (ref 0.0–0.0)
Platelets: 205 10*3/uL (ref 142–346)
RBC: 4.5 10*6/uL (ref 3.90–5.10)
RDW: 14 % (ref 11–15)
WBC: 4.58 10*3/uL (ref 3.10–9.50)

## 2018-03-08 LAB — COMPREHENSIVE METABOLIC PANEL
ALT: 13 U/L (ref 0–55)
AST (SGOT): 18 U/L (ref 5–34)
Albumin/Globulin Ratio: 1.4 (ref 0.9–2.2)
Albumin: 3.9 g/dL (ref 3.5–5.0)
Alkaline Phosphatase: 88 U/L (ref 37–106)
Anion Gap: 9 (ref 5.0–15.0)
BUN: 8 mg/dL (ref 7–19)
Bilirubin, Total: 0.4 mg/dL (ref 0.2–1.2)
CO2: 27 mEq/L (ref 22–29)
Calcium: 9.4 mg/dL (ref 8.5–10.5)
Chloride: 108 mEq/L (ref 100–111)
Creatinine: 0.7 mg/dL (ref 0.6–1.0)
Globulin: 2.7 g/dL (ref 2.0–3.6)
Glucose: 85 mg/dL (ref 70–100)
Potassium: 4.5 mEq/L (ref 3.5–5.1)
Protein, Total: 6.6 g/dL (ref 6.0–8.3)
Sodium: 144 mEq/L (ref 136–145)

## 2018-03-08 LAB — URINALYSIS WITH MICROSCOPIC
Bilirubin, UA: NEGATIVE
Blood, UA: NEGATIVE
Glucose, UA: NEGATIVE
Ketones UA: NEGATIVE
Leukocyte Esterase, UA: NEGATIVE
Nitrite, UA: NEGATIVE
Protein, UR: NEGATIVE
Specific Gravity UA: 1.012 (ref 1.001–1.035)
Urine pH: 6 (ref 5.0–8.0)
Urobilinogen, UA: NEGATIVE mg/dL

## 2018-03-08 LAB — LIPASE: Lipase: 13 U/L (ref 8–78)

## 2018-03-08 LAB — GFR: EGFR: >60

## 2018-03-08 MED ORDER — ALUM & MAG HYDROXIDE-SIMETH 200-200-20 MG/5ML PO SUSP
30.00 mL | Freq: Once | ORAL | Status: AC
Start: 2018-03-08 — End: 2018-03-08
  Administered 2018-03-08: 13:00:00 30 mL via ORAL
  Filled 2018-03-08: qty 30

## 2018-03-08 MED ORDER — MORPHINE SULFATE 4 MG/ML IJ/IV SOLN (WRAP)
4.0000 mg | Freq: Once | Status: AC
Start: 2018-03-08 — End: 2018-03-08
  Administered 2018-03-08: 15:00:00 4 mg via INTRAVENOUS
  Filled 2018-03-08: qty 1

## 2018-03-08 MED ORDER — MORPHINE SULFATE 4 MG/ML IJ/IV SOLN (WRAP)
4.0000 mg | Freq: Once | Status: AC
Start: 2018-03-08 — End: 2018-03-08
  Administered 2018-03-08: 13:00:00 4 mg via INTRAVENOUS
  Filled 2018-03-08: qty 1

## 2018-03-08 MED ORDER — SODIUM CHLORIDE 0.9 % IV BOLUS
1000.00 mL | Freq: Once | INTRAVENOUS | Status: AC
Start: 2018-03-08 — End: 2018-03-08
  Administered 2018-03-08: 14:00:00 1000 mL via INTRAVENOUS

## 2018-03-08 MED ORDER — LIDOCAINE VISCOUS 2 % MT SOLN
10.00 mL | Freq: Once | OROMUCOSAL | Status: AC
Start: 2018-03-08 — End: 2018-03-08
  Administered 2018-03-08: 13:00:00 10 mL via OROMUCOSAL
  Filled 2018-03-08: qty 15

## 2018-03-08 MED ORDER — ONDANSETRON 4 MG PO TBDP
4.00 mg | ORAL_TABLET | Freq: Four times a day (QID) | ORAL | 0 refills | Status: DC | PRN
Start: 2018-03-08 — End: 2018-07-07

## 2018-03-08 MED ORDER — IOHEXOL 350 MG/ML IV SOLN
100.00 mL | Freq: Once | INTRAVENOUS | Status: AC | PRN
Start: 2018-03-08 — End: 2018-03-08
  Administered 2018-03-08: 14:00:00 100 mL via INTRAVENOUS

## 2018-03-08 MED ORDER — ONDANSETRON HCL 4 MG/2ML IJ SOLN
4.00 mg | Freq: Once | INTRAMUSCULAR | Status: AC
Start: 2018-03-08 — End: 2018-03-08
  Administered 2018-03-08: 13:00:00 4 mg via INTRAVENOUS
  Filled 2018-03-08: qty 2

## 2018-03-08 NOTE — ED Provider Notes (Signed)
Physician/Midlevel provider first contact with patient: 03/08/18 1237         History     Chief Complaint   Patient presents with   . Abdominal Pain   . Dizziness     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  She reports that she developed diarrhea in the past 3 days, followed by bilateral upper abdominal pain that radiates to her back.  The pain has become constant today.  She reports nausea but no vomiting.  She also reports feeling dizzy and light-headedness as well.  No fever.                   Past Medical History:   Diagnosis Date   . Asthma    . Chest pain 2016     Chest pain after eating x6  months--being followed by GI for GERD   . Constipation    . Difficulty walking     amb with cane secondary to arthritis bil knees   . Genital herpes     no current outbreak (10/03/15)   . GERD (gastroesophageal reflux disease)    . Hiatal hernia     h/[name] surgery   . Hyperlipidemia    . Hypertensive disorder     Well controlled on med. Denies any SOB in last 6 months (10/03/15)   . Low back pain    . Morbid obesity with BMI of 40.0-44.9, adult     BMI 40.9   . Nausea without vomiting    . OSA on CPAP 2015    CPAP nightly x 1 yr   . Osteoarthritis of knees, bilateral     and back   . TIA (transient ischemic attack) 2014    TIA 2014-no residual. Patient evaulated for possible TIA 07/12/2015  @ [facility] (dischage summary in epic)-per summary CT of head was normal and patient was d/c'd       Past Surgical History:   Procedure Laterality Date   . APPENDECTOMY  >25 yrs   . CHOLECYSTECTOMY     . EGD  01/2015   . EGD, BIOPSY N/A 09/15/2017    Procedure: EGD, BIOPSY;  Surgeon: Pershing Proud, MD;  Location: [facility];  Service: Gastroenterology;  Laterality: N/A;   . fallopian tube surgery  >25 yrs   . INSERTION, PAIN PUMP (MEDICAL) N/A 10/22/2015    Procedure: INSERTION, PAIN PUMP (MEDICAL);  Surgeon: Nicola Police, DO;  Location: [facility]  MAIN [facility];  Service: General;  Laterality: N/A;   . LAPAROSCOPIC, CHOLECYSTECTOMY, CHOLANGIOGRAM  08/13/2014   . LAPAROSCOPIC, GASTRIC BYPASS N/A 10/22/2015    Procedure: LAPAROSCOPIC, GASTRIC BYPASS;  Surgeon: Josefa Half R, DO;  Location: [facility];  Service: General;  Laterality: N/A;   . LAPAROSCOPIC, HERNIORRHAPHY, HIATAL N/A 10/22/2015    Procedure: LAPAROSCOPIC, HERNIORRHAPHY, HIATAL;  Surgeon: Jeanell Sparrow, Hamid R, DO;  Location:  MAIN OR;  Service: General;  Laterality: N/A;   . OVARY SURGERY Right >25 yrs   . SPINE SURGERY  11/2013    cervical HNP x 2, Dr. Bufford Buttner       Family History   Problem Relation Age of Onset   . Cancer Mother 32        breast cancer   . Breast cancer Mother    . Heart disease Father    . Malignant hyperthermia Neg Hx    . Pseudochol  deficiency Neg Hx        Social  Social History   Substance Use Topics   . Smoking status: Never Smoker   . Smokeless tobacco: Never Used   . Alcohol use No       .     Allergies   Allergen Reactions   . Acyclovir Nausea And Vomiting     Other reaction(s): gi distress   . Amoxicillin-Pot Clavulanate      Other reaction(s): gi distress     . Aspirin Nausea And Vomiting     Patient states "it upsets my stomach"  Other reaction(s): gi distress  Upsets stomach  Able to tolerate enteric coated aspirin   . Atorvastatin        Palpitation   . Lovastatin Nausea And Vomiting     Other reaction(s): gi distress   . Metronidazole Nausea And Vomiting     Other reaction(s): gi distress   . Moxifloxacin Nausea And Vomiting         GI symptoms   . Other Nausea And Vomiting   . Rosuvastatin      Palpitation, chest pain, abd pain    . Statins    . Sulfa Antibiotics Nausea And Vomiting     Other reaction(s): gi distress       Home Medications     Med List Status:  In Progress Set By: Mindi Slicker, RN at 03/08/2018 12:20 PM                albuterol (PROVENTIL HFA;VENTOLIN HFA) 108 (90 BASE) MCG/ACT inhaler     Inhale into the lungs every 4 (four) hours  as needed.        aspirin EC 81 MG EC tablet     Take 1 tablet (81 mg total) by mouth daily.     atenolol (TENORMIN) 50 MG tablet     Take 1 tablet (50 mg total) by mouth daily     B Complex Vitamins (VITAMIN-B COMPLEX) Tab     Take  by Mouth Once in the morning     cholecalciferol (VITAMIN D-1000 MAX ST) 1000 units tablet     Take 1,000 Units by Mouth Once a Day at lunch time     diazePAM (VALIUM) 2 MG tablet     Take 1 tablet (2 mg total) by mouth every 8 (eight) hours as needed for Anxiety.     fluticasone (FLONASE) 50 MCG/ACT nasal spray     2 sprays by Nasal route daily.     Patient taking differently:  2 sprays by Nasal route as needed.        lidocaine (LIDODERM) 5 %     Place 1 patch onto the skin daily.Remove & Discard patch within 12 hours or as directed by MD     pantoprazole (PROTONIX) 40 MG tablet     TAKE 1 TABLET(40 MG) BY MOUTH DAILY     valACYclovir HCL (VALTREX) 500 MG tablet     TAKE 1 TABLET(500 MG) BY MOUTH DAILY     Patient taking differently:  TAKE 1 TABLET(500 MG) BY MOUTH DAILY PRN           Review of Systems   Constitutional: Negative for chills and fever.   Respiratory: Negative for shortness of breath.    Cardiovascular: Negative for chest pain.   Gastrointestinal: Positive for diarrhea and nausea. Negative for abdominal pain and vomiting.   Genitourinary: Negative for dysuria.   Musculoskeletal: Negative for arthralgias,  back pain and neck pain.   Neurological: Positive for dizziness and light-headedness. Negative for numbness and headaches.   All other systems reviewed and are negative.      Physical Exam    BP: (!) 194/94, Heart Rate: 62, Temp: 98.2 F (36.8 C), Resp Rate: 20, SpO2: 98 %, Weight: 59.5 kg    Physical Exam   Constitutional: She is oriented to person, place, and time. She appears well-developed and well-nourished.   HENT:   Head: Normocephalic and atraumatic.   Eyes: No scleral icterus.   Cardiovascular: Normal rate and regular rhythm.    No murmur  heard.  Pulmonary/Chest: Effort normal and breath sounds normal. No respiratory distress. She has no wheezes. She has no rales. She exhibits no tenderness.   Abdominal: Soft. Bowel sounds are normal. She exhibits no distension. There is tenderness in the right upper quadrant, epigastric area and left upper quadrant.   Musculoskeletal: She exhibits no edema.   Neurological: She is alert and oriented to person, place, and time. No cranial nerve deficit.   Skin: Skin is warm and dry.   Psychiatric: She has a normal mood and affect.   Nursing note and vitals reviewed.        MDM and ED Course     ED Medication Orders     Start Ordered     Status Ordering Provider    03/08/18 1503 03/08/18 1502  morphine injection 4 mg  Once     Route: Intravenous  Ordered Dose: 4 mg     Ordered Amber [name]    03/08/18 1244 03/08/18 1243  sodium chloride 0.9 % bolus 1,000 mL  Once     Route: Intravenous  Ordered Dose: 1,000 mL     Last MAR action:  New Bag Amber [name]    03/08/18 1244 03/08/18 1243  lidocaine viscous (XYLOCAINE) 2 % solution 10 mL  Once     Route: Mouth/Throat  Ordered Dose: 10 mL     Last MAR action:  Given Amber [name]    03/08/18 1244 03/08/18 1243  alum & mag hydroxide-simethicone (MAALOX PLUS) 200-200-20 mg/5 mL suspension 30 mL  Once     Route: Oral  Ordered Dose: 30 mL     Last MAR action:  Given Amber [name]    03/08/18 1243 03/08/18 1242  morphine injection 4 mg  Once     Route: Intravenous  Ordered Dose: 4 mg     Last MAR action:  Given Amber [name]    03/08/18 1243 03/08/18 1242  ondansetron (ZOFRAN) injection 4 mg  Once     Route: Intravenous  Ordered Dose: 4 mg     Last MAR action:  Given Amber [name]         EKG:  NSR @ 57 bpm, left axis deviation.  LVH.      MDM  Number of Diagnoses or Management Options  Diagnosis management comments: Unremarkable workup and CT scan.  I recommended she follow up with her bariatric surgeon.        Results      Procedure Component Value Units Date/Time    UA with microscopic (pts < 3 yrs) [[id number]] Collected:  03/08/18 1331    Specimen:  Urine Updated:  03/08/18 1419     Urine Type Clean Catch     Color, UA Yellow     Clarity, UA Clear     Specific Gravity UA 1.012  Urine pH 6.0     Leukocyte Esterase, UA Negative     Nitrite, UA Negative     Protein, UR Negative     Glucose, UA Negative     Ketones UA Negative     Urobilinogen, UA Negative mg/dL      Bilirubin, UA Negative     Blood, UA Negative     RBC, UA 0 - 2 /hpf      WBC, UA 0 - 5 /hpf      Squamous Epithelial Cells, Urine 0 - 5 /hpf     Comprehensive Metabolic Panel (CMP) [[id number]] Collected:  03/08/18 1311    Specimen:  Blood Updated:  03/08/18 1400     Glucose 85 mg/dL      BUN 8 mg/dL      Creatinine 0.7 mg/dL      Sodium 962 mEq/L      Potassium 4.5 mEq/L      Chloride 108 mEq/L      CO2 27 mEq/L      Calcium 9.4 mg/dL      Protein, Total 6.6 g/dL      Albumin 3.9 g/dL      AST (SGOT) 18 U/L      ALT 13 U/L      Alkaline Phosphatase 88 U/L      Bilirubin, Total 0.4 mg/dL      Globulin 2.7 g/dL      Albumin/Globulin Ratio 1.4     Anion Gap 9.0    Lipase [[id number]] Collected:  03/08/18 1311    Specimen:  Blood Updated:  03/08/18 1400     Lipase 13 U/L     GFR [[id number]] Collected:  03/08/18 1311     Updated:  03/08/18 1400     EGFR >60.0    CBC with differential [[id number]] Collected:  03/08/18 1311    Specimen:  Blood from Blood Updated:  03/08/18 1336     WBC 4.58 x10 3/uL      Hgb 13.0 g/dL      Hematocrit 25.9 %      Platelets 205 x10 3/uL      RBC 4.50 x10 6/uL      MCV 88.0 fL      MCH 28.9 pg      MCHC 32.8 g/dL      RDW 14 %      MPV 10.2 fL      Neutrophils 55.2 %      Lymphocytes Automated 32.8 %      Monocytes 9.8 %      Eosinophils Automated 1.3 %      Basophils Automated 0.7 %      Immature Granulocyte 0.2 %      Nucleated RBC 0.0 /100 WBC      Neutrophils Absolute 2.53 x10 3/uL      Abs Lymph Automated 1.50 x10 3/uL      Abs Mono Automated  0.45 x10 3/uL      Abs Eos Automated 0.06 x10 3/uL      Absolute Baso Automated 0.03 x10 3/uL      Absolute Immature Granulocyte 0.01 x10 3/uL      Absolute NRBC 0.00 x10 3/uL         Radiology Results (24 Hour)     Procedure Component Value Units Date/Time    CT Abd/Pelvis with IV Contrast only [[id number]] Collected:  03/08/18 1429    Order Status:  Completed Updated:  03/08/18 1435    Narrative:  HISTORY: Abdominal pain.    TECHNIQUE: Enhanced, abdominal and pelvic computed tomography was  performed at 5 mm thickness.    . A combination of automatic exposure  control, adjustment of the mA a and/or KVP according to the patient's  size and or use of iterative reconstruction technique was utilized.    PRIOR: 06/05/2017.    FINDINGS:   The patient is status post cholecystectomy with dilatation of the common  and intrahepatic ducts, in keeping with cholecystectomy. The liver is  normal in size with an approximately 1.5 cm stable hepatic cysts.    Gastric surgical sutures are present. The rest of the gastrointestinal  tract is within normal limits. There is no evidence of gastrointestinal  obstruction.          There is 2 cm right renal simple cyst. Otherwise the kidneys are  unremarkable. The adrenal glands are normal.    The great vessels are normal in caliber. There is no evidence of  ascites.    The pelvis show     no evidence of pelvic masses or collection.  The urinary bladder is unremarkable.       Impression:        No acute intra-abdominal abnormalities.    Georgana Curio, MD   03/08/2018 2:31 PM                     Procedures    Clinical Impression & Disposition     Clinical Impression  Final diagnoses:   Epigastric pain        ED Disposition     ED Disposition Condition Date/Time Comment    Discharge  Tue Mar 08, 2018  3:02 PM [name] discharge to home/self care.    Condition at disposition: Stable           New Prescriptions    ONDANSETRON (ZOFRAN-ODT) 4 MG DISINTEGRATING TABLET    Take 1 tablet  (4 mg total) by mouth every 6 (six) hours as needed for Nausea                 Genia Hotter, MD  03/08/18 1502

## 2018-03-08 NOTE — ED Notes (Signed)
Pt discharged to the lobby to wait for her ride. Pt stable with steady gait upon discharge

## 2018-03-08 NOTE — Discharge Instructions (Signed)
Abdominal Pain    You have been diagnosed with abdominal (belly) pain. The cause of your pain is not yet known.    Many things can cause abdominal pain. Examples include viral infections and bowel (intestine) spasms. You might need another examination or more tests to find out why you have pain.    At this time, your pain does not seem to be caused by anything dangerous. You do not need surgery. You do not need to stay in the hospital.     Though we don't believe your condition is dangerous right now, it is important to be careful. Sometimes a problem that seems mild can become serious later. This is why it is very important that you return here or go to the nearest Emergency Department unless you are 100% improved.   Follow up with your physician.    Drink only clear liquids such as water, clear broth, sports drinks, or clear caffeine-free soft drinks, like 7-Up or Sprite, for the next:   24 hours.    YOU SHOULD SEEK MEDICAL ATTENTION IMMEDIATELY, EITHER HERE OR AT THE NEAREST EMERGENCY DEPARTMENT, IF ANY OF THE FOLLOWING OCCURS:   Your pain does not go away or gets worse.   You cannot keep fluids down or your vomit is dark green.    You vomit blood or see blood in your stool. Blood might be bright red or dark red. It can also be black and look like tar.   You have a fever (temperature higher than 100.4F / 38C) or shaking chills.   Your skin or eyes look yellow or your urine looks brown.   You have severe diarrhea.

## 2018-03-08 NOTE — EDIE (Signed)
COLLECTIVE?NOTIFICATION?03/08/2018 12:07?[place], Droll F?MRN: [id number]    [facility]'s patient encounter information:   VWU:?[id number]  Account [id number]  Billing Account [id number]      Criteria Met      5 ED Visits in 12 Months    Security and Safety  No recent Security Events currently on file    ED Care Guidelines  There are currently no ED Care Guidelines for this patient. Please check your facility's medical records system.      Prescription Monitoring Program  080??- Narcotic Use Score  110??- Sedative Use Score  000??- Stimulant Use Score  100??- Overdose Risk Score  - All Scores range from 000-999 with 75% of the population scoring < 200 and on 1% scoring above 650  - The last digit of the narcotic, sedative, and stimulant score indicates the number of active prescriptions of that type  - Higher Use scores correlate with increased prescribers, pharmacies, mg equiv, and overlapping prescriptions  - Higher Overdose Risk Scores correlate with increased risk of unintentional overdose death   Concerning or unexpectedly high scores should prompt a review of the PMP record; this does not constitute checking PMP for prescribing purposes.      E.D. Visit Count (12 mo.)  Facility Visits   [place] [facility]: [facility] [place] 1   [place] [facility] 2   [facility] 1   [place] [facility]: [facility] 6   Total 10   Note: Visits indicate total known visits.      Recent Emergency Department Visit Summary  Date Facility [facility] Type Diagnoses or Chief Complaint   Mar 08, 2018 [place] [facility] [place] [facility]      abdominal pain,dizzy, nausea      Jan 24, 2018 [place] [facility] [place] [facility]      Rapid Heart Rate      Chest Pain      Anesthesia of skin      Chest pain, unspecified      Palpitations      Nov 21, 2017 [place] [facility] [place] [facility]       TRIAGE - Pain on Left Knee      Knee Pain      Pain in left knee      Nov 04, 2017 [place] [facility]. [place] [facility]      Hypertention,dizziness      Chest Pain      Dizziness      Other fatigue      Bariatric surgery status      Essential (primary) hypertension      Postgastric surgery syndromes      Aug 17, 2017 [place] [facility]: [facility] [place] [facility]      chest pain      Dizziness and giddiness      Chest pain, unspecified      Elevated blood-pressure reading, without diagnosis of hypertension      Jul 16, 2017 [place] [facility]: [facility] [place] [facility]      chest pain      Nonspecific low blood-pressure reading      Chest pain, unspecified      Palpitations      Jul 03, 2017  Emergency Room: [facility]  Emergency      possible allergic reaction      Urticaria  Urticaria, unspecified      Jul 02, 2017 [place] [facility] [place] [facility] [place] [facility]      chest pain      Chest pain, unspecified      Jun 05, 2017 [place] [facility]: [facility] [place] [facility]      Headache, throbbing pain all over, light headed, nauseated      Dizziness      Headache      Other headache syndrome      Hematemesis      Apr 03, 2017 [place] [facility] [place] [facility]      Chest pain, unspecified      Acquired absence of other specified parts of digestive tract          Recent Inpatient Visit Summary  Date Facility [facility] Type Diagnoses or Chief Complaint   Jan 24, 2018 [place] [facility] [place] [facility]  [facility]      Anesthesia of skin      Palpitations      Chest pain, unspecified      Headache      Paresthesia of skin      Other chest pain          Care Providers  There are no care providers on record at this time.   Collective Portal  This patient has registered at the [facility] Emergency Department   For more information visit:  [email or web address]     andnbsp PLEASE NOTE:    1.   Any care recommendations and other clinical information are provided as guidelines or for historical purposes only, and providers should exercise their own clinical judgment when providing care.    2.   You may only use this information for purposes of treatment, payment or health care operations activities, and subject to the limitations of applicable Collective Policies.    3.   You should consult directly with the organization that provided a care guideline or other clinical history with any questions about additional information or accuracy or completeness of information provided.    ? 2019 [place], [employer] - [email or web address]

## 2018-03-10 LAB — CATECHOLAMINES, FRACTIONATED, VMA, URINE, 24HR
24HR, UR vol.: 800 mL
24HR, UR vol.: 800 mL
Calculated Total (E+NE): 27 mcg/24 h (ref 26–121)
Dopamine , 24H Ur: 65 mcg/24 h (ref 52–480)
Epinephrine 24HR, UR: 4 mcg/24 h (ref 2–24)
Norepinephrine, 24H Ur: 23 mcg/24 h (ref 15–100)
VMA 24 Hr Urine: 2.9 mg/24 h (ref <=6.0)

## 2018-03-10 LAB — VMA, URINE, 24 HOUR
24HR, UR vol.: 800 mL
Creatinine 24HR, UR: 0.83 g/(24.h) (ref 0.50–2.15)
Vanillylmandelic Acid: 3 mg/24 h (ref <=6.0)

## 2018-03-10 LAB — URINE METANEPHRINES 24 HOUR
24HR, UR vol.: 800 mL
Metanephrine: 155 mcg/24 h (ref 90–315)
Metanephrines, Total: 410 mcg/24 h (ref 224–832)
Normetanephrine: 255 mcg/24 h (ref 122–676)

## 2018-03-10 LAB — 5-HYDROXYINDOLEACETIC ACID (5-HIAA), 24-HOUR URINE, WITH CREATININE
24HR, UR vol.: 800 mL
Creatinine 24HR, UR: 0.83 g/(24.h) (ref 0.50–2.15)
Urine 5-Hydroxyindoleacetic Acid 24 Hour: 3.6 mg/24 h (ref <=6.0)

## 2018-03-11 LAB — ECG 12-LEAD
Atrial Rate: 57 {beats}/min
P Axis: 42 degrees
P-R Interval: 154 ms
Q-T Interval: 400 ms
QRS Duration: 88 ms
QTC Calculation (Bezet): 389 ms
R Axis: -5 degrees
T Axis: 15 degrees
Ventricular Rate: 57 {beats}/min

## 2018-03-15 ENCOUNTER — Encounter (INDEPENDENT_AMBULATORY_CARE_PROVIDER_SITE_OTHER): Payer: Self-pay | Admitting: Internal Medicine

## 2018-03-16 ENCOUNTER — Encounter (INDEPENDENT_AMBULATORY_CARE_PROVIDER_SITE_OTHER): Payer: Self-pay | Admitting: Cardiology

## 2018-03-16 ENCOUNTER — Ambulatory Visit (INDEPENDENT_AMBULATORY_CARE_PROVIDER_SITE_OTHER): Payer: Medicare Other | Admitting: Cardiology

## 2018-03-16 ENCOUNTER — Telehealth (INDEPENDENT_AMBULATORY_CARE_PROVIDER_SITE_OTHER): Payer: Self-pay

## 2018-03-16 VITALS — BP 147/87 | HR 67 | Temp 98.0°F | Ht 62.0 in | Wt 133.2 lb

## 2018-03-16 DIAGNOSIS — R9431 Abnormal electrocardiogram [ECG] [EKG]: Secondary | ICD-10-CM

## 2018-03-16 DIAGNOSIS — I10 Essential (primary) hypertension: Secondary | ICD-10-CM

## 2018-03-16 DIAGNOSIS — Z8673 Personal history of transient ischemic attack (TIA), and cerebral infarction without residual deficits: Secondary | ICD-10-CM

## 2018-03-16 DIAGNOSIS — Z09 Encounter for follow-up examination after completed treatment for conditions other than malignant neoplasm: Secondary | ICD-10-CM

## 2018-03-16 DIAGNOSIS — R002 Palpitations: Secondary | ICD-10-CM

## 2018-03-16 DIAGNOSIS — G459 Transient cerebral ischemic attack, unspecified: Secondary | ICD-10-CM

## 2018-03-16 DIAGNOSIS — Z7982 Long term (current) use of aspirin: Secondary | ICD-10-CM

## 2018-03-16 MED ORDER — LISINOPRIL 10 MG PO TABS
10.0000 mg | ORAL_TABLET | Freq: Every day | ORAL | 3 refills | Status: DC
Start: 2018-03-16 — End: 2018-04-14

## 2018-03-16 NOTE — Telephone Encounter (Signed)
ED Visit Follow Up Call    ED DISCHARGE CALL:    ED Discharge Date: 03/08/18  Primary Discharge Dx: Epigastric pain  Secondary Dx:  ED Discharge Appt with PCP: N/A    Call placed to patient to follow up on recent ED visit, assess current status, address questions/concerns regarding discharge instructions / medications and encourage patient to schedule ED Discharge follow up appt with PCP; No Answer; LVMM with contact information and request for return call.  Will continue to follow patient.

## 2018-03-16 NOTE — Progress Notes (Signed)
Amber [name] [phone]. with a history of HTN presents for cardiac follow up    Recent TIA -was not on ASA at the time  Discharged home on ASA  No recurrent events      Cardiac work up has included  - stress test (11/06/17 - normal myocardial perfusion)  - cardiac cath ( 01/08/14 - normal )  - echocardiogram ( EF 55%, mild TR)  - 24 hour holter - no significant events        Palpitations  - daily  - no syncope  - no significant events on 30 day event monitor    Chest pain  - atypical  - normal cardiac cath        Dysphagia  Odynophagia  Dyspepsia  Hx of bariatric surgery        Past Medical History:   Diagnosis Date   . Asthma    . Chest pain 2016     Chest pain after eating x6  months--being followed by GI for GERD   . Constipation    . Difficulty walking     amb with cane secondary to arthritis bil knees   . Genital herpes     no current outbreak (10/03/15)   . GERD (gastroesophageal reflux disease)    . Hiatal hernia     h/o surgery   . Hyperlipidemia    . Hypertensive disorder     Well controlled on med. Denies any SOB in last 6 months (10/03/15)   . Low back pain    . Morbid obesity with BMI of 40.0-44.9, adult     BMI 40.9   . Nausea without vomiting    . OSA on CPAP 2015    CPAP nightly x 1 yr   . Osteoarthritis of knees, bilateral     and back   . TIA (transient ischemic attack) 2014    TIA 2014-no residual. Patient evaulated for possible TIA 07/12/2015  @ [facility] (dischage summary in epic)-per summary CT of head was normal and patient was d/c'd     Family History   Problem Relation Age of Onset   . Cancer Mother 71        breast cancer   . Breast cancer Mother    . Heart disease Father    . Malignant hyperthermia Neg Hx    . Pseudochol deficiency Neg Hx      Current Outpatient Prescriptions   Medication Sig Dispense Refill   . albuterol (PROVENTIL HFA;VENTOLIN HFA) 108 (90 BASE) MCG/ACT inhaler  Inhale into the lungs every 4 (four) hours as needed.        Marland Kitchen aspirin EC 81 MG EC tablet Take 1 tablet (81 mg total) by mouth daily. 30 tablet 1   . atenolol (TENORMIN) 50 MG tablet Take 1 tablet (50 mg total) by mouth daily 90 tablet 0   . B Complex Vitamins (VITAMIN-B COMPLEX) Tab Take  by Mouth Once in the morning     . cholecalciferol (VITAMIN D-1000 MAX ST) 1000 units tablet Take 1,000 Units by Mouth Once a Day at lunch time     . diazePAM (VALIUM) 2 MG tablet Take 1 tablet (2 mg total) by mouth every 8 (eight) hours as needed for Anxiety. 30 tablet 2   . fluticasone (FLONASE) 50 MCG/ACT nasal spray 2 sprays by Nasal route daily. (Patient taking differently: 2 sprays by Nasal route as needed.   ) 16 g 2   . lidocaine (LIDODERM) 5 % Place  1 patch onto the skin daily.Remove & Discard patch within 12 hours or as directed by MD 15 each 0   . ondansetron (ZOFRAN-ODT) 4 MG disintegrating tablet Take 1 tablet (4 mg total) by mouth every 6 (six) hours as needed for Nausea 8 tablet 0   . pantoprazole (PROTONIX) 40 MG tablet TAKE 1 TABLET(40 MG) BY MOUTH DAILY 90 tablet 0   . valACYclovir HCL (VALTREX) 500 MG tablet TAKE 1 TABLET(500 MG) BY MOUTH DAILY (Patient taking differently: TAKE 1 TABLET(500 MG) BY MOUTH DAILY PRN) 90 tablet 0   . vitamin A 78938 UNIT capsule Take 10,000 Units by mouth daily     . lisinopril (PRINIVIL,ZESTRIL) 10 MG tablet Take 1 tablet (10 mg total) by mouth daily 90 tablet 3     No current facility-administered medications for this visit.       PE:    Vitals:    03/16/18 1321   BP: 147/87   Pulse: 67   Temp: 98 F (36.7 C)   SpO2: 98%     Body mass index is 24.36 kg/m.    General:  nad  cv rr  Lung cta  abd s  Ext no edema      EKG:    nsr    Labs:  Lipid Panel   Cholesterol   Date/Time Value Ref Range Status   01/25/2018 05:06 AM 151 0 - 199 mg/dL Final     Triglycerides   Date/Time Value Ref Range Status   01/25/2018 05:06 AM 26 (L) 34 - 149 mg/dL Final     HDL   Date/Time Value Ref Range  Status   01/25/2018 05:06 AM 53 40 - 9,999 mg/dL Final     Comment:     An HDL cholesterol <40 mg/dL is low and constitutes a  coronary heart disease risk factor, and HDL-C>59 mg/dL is  a negative risk factor for CHD.  Ref: American Heart Association; Circulation 2004         CMP:   Sodium   Date/Time Value Ref Range Status   03/08/2018 01:11 PM 144 136 - 145 mEq/L Final     Potassium   Date/Time Value Ref Range Status   03/08/2018 01:11 PM 4.5 3.5 - 5.1 mEq/L Final     Chloride   Date/Time Value Ref Range Status   03/08/2018 01:11 PM 108 100 - 111 mEq/L Final   11/10/2017 07:40 AM 107 98 - 110 mmol/L Final     CO2   Date/Time Value Ref Range Status   03/08/2018 01:11 PM 27 22 - 29 mEq/L Final     Glucose   Date/Time Value Ref Range Status   03/08/2018 01:11 PM 85 70 - 100 mg/dL Final     Comment:     ADA guidelines for diabetes mellitus:  Fasting:  Equal to or greater than 126 mg/dL  Random:   Equal to or greater than 200 mg/dL       BUN   Date/Time Value Ref Range Status   03/08/2018 01:11 PM 8 7 - 19 mg/dL Final     Protein, Total   Date/Time Value Ref Range Status   03/08/2018 01:11 PM 6.6 6.0 - 8.3 g/dL Final   08/18/5101 58:52 AM 6.4 6.1 - 8.1 g/dL Final     Alkaline Phosphatase   Date/Time Value Ref Range Status   03/08/2018 01:11 PM 88 37 - 106 U/L Final     AST (SGOT)   Date/Time Value Ref Range  Status   03/08/2018 01:11 PM 18 5 - 34 U/L Final     ALT   Date/Time Value Ref Range Status   03/08/2018 01:11 PM 13 0 - 55 U/L Final     Anion Gap   Date/Time Value Ref Range Status   03/08/2018 01:11 PM 9.0 5.0 - 15.0 Final       CBC:   WBC   Date/Time Value Ref Range Status   03/08/2018 01:11 PM 4.58 3.10 - 9.50 x10 3/uL Final   06/30/2009 04:03 PM 9.12 3.50 - 10.80 /CUMM Final     RBC   Date/Time Value Ref Range Status   03/08/2018 01:11 PM 4.50 3.90 - 5.10 x10 6/uL Final     Hemoglobin   Date/Time Value Ref Range Status   11/10/2017 07:40 AM 12.5 11.7 - 15.5 g/dL Final     Hgb   Date/Time Value Ref Range Status    03/08/2018 01:11 PM 13.0 11.4 - 14.8 g/dL Final     Hematocrit   Date/Time Value Ref Range Status   03/08/2018 01:11 PM 39.6 34.7 - 43.7 % Final     MCV   Date/Time Value Ref Range Status   03/08/2018 01:11 PM 88.0 78.0 - 96.0 fL Final     MCHC   Date/Time Value Ref Range Status   03/08/2018 01:11 PM 32.8 31.5 - 35.8 g/dL Final     RDW   Date/Time Value Ref Range Status   03/08/2018 01:11 PM 14 11 - 15 % Final     Platelets   Date/Time Value Ref Range Status   03/08/2018 01:11 PM 205 142 - 346 x10 3/uL Final   11/10/2017 07:40 AM 266 140 - 400 Thousand/uL Final           Impression / plan    TIA   - continue ASA   - check 30 day event monitor - if no events, consider linq monitor in the future  HTN   - continue atenolol   - add lisinopril    rtc 6 weeks          Kessler Kopinski Dan Humphreys  03/16/2018

## 2018-03-17 NOTE — Progress Notes (Signed)
24 hour urine tests are normal    Rec: 1.continue meds  2. Keep follow up visit

## 2018-03-31 ENCOUNTER — Encounter (HOSPITAL_BASED_OUTPATIENT_CLINIC_OR_DEPARTMENT_OTHER): Payer: No Typology Code available for payment source | Admitting: Gastroenterology

## 2018-04-06 ENCOUNTER — Encounter (INDEPENDENT_AMBULATORY_CARE_PROVIDER_SITE_OTHER): Payer: Self-pay

## 2018-04-06 NOTE — Progress Notes (Signed)
Patient called and stated that she was having terrible headaches and nausea since starting Lisinopril. Dr. Katrinka Blazing consulted and wanted to prescribe Norvasc 5 mg but patient stated that Norvasc had previously given her a rapid heart rate. Dr. Katrinka Blazing ordered patient to stop Lisinopril and monitor her blood pressure notify us of the results next week or sooner if elevated.

## 2018-04-07 ENCOUNTER — Ambulatory Visit: Payer: No Typology Code available for payment source | Attending: Gastroenterology | Admitting: Gastroenterology

## 2018-04-07 DIAGNOSIS — K862 Cyst of pancreas: Secondary | ICD-10-CM | POA: Insufficient documentation

## 2018-04-07 DIAGNOSIS — D490 Neoplasm of unspecified behavior of digestive system: Secondary | ICD-10-CM | POA: Insufficient documentation

## 2018-04-07 DIAGNOSIS — R933 Abnormal findings on diagnostic imaging of other parts of digestive tract: Secondary | ICD-10-CM | POA: Insufficient documentation

## 2018-04-08 ENCOUNTER — Telehealth (HOSPITAL_BASED_OUTPATIENT_CLINIC_OR_DEPARTMENT_OTHER): Payer: Self-pay | Admitting: Gastroenterology

## 2018-04-08 NOTE — Telephone Encounter (Signed)
Jasmine Hunt had an EUS with Dr. Tye Savoy on 04/07/18. She was phoned in routine post procedure follow up and a message was left for her to call with any pain or concerns. Her plan is below. Routing message to the interventional [facility] pool.

## 2018-04-10 ENCOUNTER — Encounter (INDEPENDENT_AMBULATORY_CARE_PROVIDER_SITE_OTHER): Payer: Self-pay

## 2018-04-11 ENCOUNTER — Telehealth (HOSPITAL_BASED_OUTPATIENT_CLINIC_OR_DEPARTMENT_OTHER): Payer: Self-pay | Admitting: Gastroenterology

## 2018-04-11 ENCOUNTER — Encounter (INDEPENDENT_AMBULATORY_CARE_PROVIDER_SITE_OTHER): Payer: Self-pay

## 2018-04-11 NOTE — Progress Notes (Signed)
Patient called and stated that she had been to the ED last week for hypertension. Dr. Katrinka Blazing notified and wants the patient to increase her Atenolol to 75 mg daily and to monitor her blood pressure for the next week. Patient notified.

## 2018-04-11 NOTE — Telephone Encounter (Signed)
Returning the patient's phone call.  Have left a message for the patient to call us back.

## 2018-04-11 NOTE — Telephone Encounter (Signed)
Patient placed on the recall list to schedule in one year. Patient will receive a letter in the mail regarding scheduling.

## 2018-04-14 ENCOUNTER — Encounter (INDEPENDENT_AMBULATORY_CARE_PROVIDER_SITE_OTHER): Payer: Self-pay | Admitting: Internal Medicine

## 2018-04-14 ENCOUNTER — Ambulatory Visit (INDEPENDENT_AMBULATORY_CARE_PROVIDER_SITE_OTHER): Payer: Medicare Other | Admitting: Family Medicine

## 2018-04-14 ENCOUNTER — Encounter (INDEPENDENT_AMBULATORY_CARE_PROVIDER_SITE_OTHER): Payer: Self-pay | Admitting: Family Medicine

## 2018-04-14 VITALS — BP 144/87 | HR 59 | Temp 98.0°F | Ht 61.0 in | Wt 132.0 lb

## 2018-04-14 DIAGNOSIS — I10 Essential (primary) hypertension: Secondary | ICD-10-CM

## 2018-04-14 DIAGNOSIS — E785 Hyperlipidemia, unspecified: Secondary | ICD-10-CM

## 2018-04-14 DIAGNOSIS — G459 Transient cerebral ischemic attack, unspecified: Secondary | ICD-10-CM

## 2018-04-14 DIAGNOSIS — R002 Palpitations: Secondary | ICD-10-CM

## 2018-04-14 DIAGNOSIS — G4733 Obstructive sleep apnea (adult) (pediatric): Secondary | ICD-10-CM

## 2018-04-14 NOTE — Progress Notes (Signed)
Subjective:    Date: 04/14/2018 11:49 AM   Patient ID: Amber [name] is a 67 y.o. female.    Chief Complaint   Patient presents with   . Post ED Visit     Chest pain, Hypertension     HPI   Patient presents in follow-up from ED visit 6/62019  Pt experienced palpitations and left sided chest pain and called 911.  She had evaluation negative for acute coronary syndrome.   She has had no chest pain or palpitations since left hospital. " alittle " short of breath.  She is wearing a heart monitor. She reports her BP has been around AM 150/90s HR 60s and states goes down later in the day to 140s/90s.  She contacted her cardiologist who increased atenolol from 50 to 75 mg 3 days ago.  She has not noticed lowering in BP.   Pt reports medication adverse reactions:  Coreg - "super tired"   norvasc - "hard palpitations"  prinivil - stomach cramp, nausea, headache.   Diltiazem - extreme constipation.     Pulmonary: Dr. Jerold Coombe.   Continues to see bariatrics team hx of bariatric surgery.    ED record reviewed.     Lab Results   Component Value Date    CHOL 151 01/25/2018    CHOL 175 11/10/2017    CHOL 187 04/30/2017     Lab Results   Component Value Date    HDL 53 01/25/2018    HDL 72 11/10/2017    HDL 90 04/30/2017     Lab Results   Component Value Date    LDL 93 01/25/2018    LDL 89 11/10/2017    LDL 83 04/30/2017     Lab Results   Component Value Date    TRIG 26 (L) 01/25/2018    TRIG 53 11/10/2017    TRIG 55 04/30/2017       No problems updated.    Patient Active Problem List   Diagnosis   . Essential hypertension   . Anxiety state, unspecified   . Abnormal electrocardiogram   . Cerebral infarction   . Gastroesophageal reflux disease   . Hypernatremia   . Paresthesia   . Slow transit constipation   . Vitamin D deficiency   . Overactive bladder   . S/P laparoscopic cholecystectomy   . Transient cerebral ischemia, unspecified type   . Palpitations   . Pituitary microadenoma   . Dyslipidemia   . Other long term (current)  drug therapy   . Headache   . History of spinal surgery   . Obstructive sleep apnea syndrome   . Osteoarthritis of knees, bilateral   . Pre-op evaluation   . Bariatric surgery status   . Nutritional deficiency   . Anxiety   . Epigastric abdominal tenderness without rebound tenderness   . Other chest pain   . Abdominal pain   . Chest pain     Patient Care Team:  Oneita Jolly, MD as PCP - General (Internal Medicine)  Donley Redder Tyrone Nine, MD as Consulting Physician (Cardiology)  Janace Litten, MD as Consulting Physician (Cardiology)  Darnelle Maffucci, MD as Consulting Physician (Cardiology)  Fausto Skillern, [place]  Burton Apley, MD as Consulting Physician (Gastroenterology)  Brett Canales, MD as Consulting Physician (Gastroenterology)  Karrie Doffing, RD as Dietitian (Dietician)  Karrie Doffing, RD as Dietitian (Dietician)  Karrie Doffing, RD as Dietitian (Dietician)  Renard Hamper, MD as Consulting Physician (Clinical Cardiac Electrophysiology)  Michele Mcalpine, RN as Registered Nurse  Theressa Stamps, RN as Registered Nurse  Immunization History   Administered Date(s) Administered   . Influenza quadrivalent (IM) PF 3 Yrs & greater 11/03/2016     Current Outpatient Prescriptions   Medication Sig Dispense Refill   . albuterol (PROVENTIL HFA;VENTOLIN HFA) 108 (90 BASE) MCG/ACT inhaler Inhale into the lungs every 4 (four) hours as needed.        Marland Kitchen aspirin EC 81 MG EC tablet Take 1 tablet (81 mg total) by mouth daily. 30 tablet 1   . atenolol (TENORMIN) 50 MG tablet Take 1 tablet (50 mg total) by mouth daily (Patient taking differently: Take 75 mg by mouth daily    ) 90 tablet 0   . B Complex Vitamins (VITAMIN-B COMPLEX) Tab Take  by Mouth every day     . cholecalciferol (VITAMIN D-1000 MAX ST) 1000 units tablet Take 1,000 Units by Mouth Once a Day at lunch time     . diazePAM (VALIUM) 2 MG tablet Take 1 tablet (2 mg total) by mouth every 8 (eight) hours as needed for Anxiety. 30 tablet 2   .  fluticasone (FLONASE) 50 MCG/ACT nasal spray 2 sprays by Nasal route daily. (Patient taking differently: 2 sprays by Nasal route as needed.   ) 16 g 2   . lidocaine (LIDODERM) 5 % Place 1 patch onto the skin daily.Remove & Discard patch within 12 hours or as directed by MD 15 each 0   . Multiple Vitamin (MULTIVITAMIN) capsule Take 1 capsule by mouth daily     . ondansetron (ZOFRAN-ODT) 4 MG disintegrating tablet Take 1 tablet (4 mg total) by mouth every 6 (six) hours as needed for Nausea 8 tablet 0   . pantoprazole (PROTONIX) 40 MG tablet TAKE 1 TABLET(40 MG) BY MOUTH DAILY 90 tablet 0   . valACYclovir HCL (VALTREX) 500 MG tablet TAKE 1 TABLET(500 MG) BY MOUTH DAILY (Patient taking differently: TAKE 1 TABLET(500 MG) BY MOUTH DAILY PRN) 90 tablet 0   . vitamin A 16109 UNIT capsule Take 10,000 Units by mouth daily     . vitamin B-12 (CYANOCOBALAMIN) 100 MCG tablet Take 50 mcg by mouth every other day       No current facility-administered medications for this visit.      Allergies   Allergen Reactions   . Acyclovir Nausea And Vomiting     Other reaction(s): gi distress   . Amoxicillin-Pot Clavulanate      Other reaction(s): gi distress     . Aspirin Nausea And Vomiting     Patient states "it upsets my stomach"  Other reaction(s): gi distress  Upsets stomach  Able to tolerate enteric coated aspirin   . Atorvastatin        Palpitation   . Lovastatin Nausea And Vomiting     Other reaction(s): gi distress   . Metronidazole Nausea And Vomiting     Other reaction(s): gi distress   . Moxifloxacin Nausea And Vomiting         GI symptoms   . Other Nausea And Vomiting   . Rosuvastatin      Palpitation, chest pain, abd pain    . Statins    . Sulfa Antibiotics Nausea And Vomiting     Other reaction(s): gi distress       Past Medical History:   Diagnosis Date   . Asthma    . Chest pain 2016     Chest pain after eating  x6  months--being followed by GI for GERD   . Constipation    . Difficulty walking     amb with cane secondary to  arthritis bil knees   . Genital herpes     no current outbreak (10/03/15)   . GERD (gastroesophageal reflux disease)    . Hiatal hernia     h/o surgery   . Hyperlipidemia    . Hypertensive disorder     Well controlled on med. Denies any SOB in last 6 months (10/03/15)   . Low back pain    . Morbid obesity with BMI of 40.0-44.9, adult     BMI 40.9   . Nausea without vomiting    . OSA on CPAP 2015    CPAP nightly x 1 yr   . Osteoarthritis of knees, bilateral     and back   . TIA (transient ischemic attack) 2014    TIA 2014-no residual. Patient evaulated for possible TIA 07/12/2015  @ [facility] (dischage summary in epic)-per summary CT of head was normal and patient was d/c'd     Past Surgical History:   Procedure Laterality Date   . APPENDECTOMY  >25 yrs   . CHOLECYSTECTOMY     . EGD  01/2015   . EGD, BIOPSY N/A 09/15/2017    Procedure: EGD, BIOPSY;  Surgeon: Pershing Proud, MD;  Location: [facility];  Service: Gastroenterology;  Laterality: N/A;   . fallopian tube surgery  >25 yrs   . INSERTION, PAIN PUMP (MEDICAL) N/A 10/22/2015    Procedure: INSERTION, PAIN PUMP (MEDICAL);  Surgeon: Nicola Police, DO;  Location: [facility];  Service: General;  Laterality: N/A;   . LAPAROSCOPIC, CHOLECYSTECTOMY, CHOLANGIOGRAM  08/13/2014   . LAPAROSCOPIC, GASTRIC BYPASS N/A 10/22/2015    Procedure: LAPAROSCOPIC, GASTRIC BYPASS;  Surgeon: Josefa Half R, DO;  Location: [facility];  Service: General;  Laterality: N/A;   . LAPAROSCOPIC, HERNIORRHAPHY, HIATAL N/A 10/22/2015    Procedure: LAPAROSCOPIC, HERNIORRHAPHY, HIATAL;  Surgeon: Jeanell Sparrow, Hamid R, DO;  Location: [facility];  Service: General;  Laterality: N/A;   . OVARY SURGERY Right >25 yrs   . SPINE SURGERY  11/2013    cervical HNP x 2, Dr. Bufford Buttner     Family History   Problem Relation Age of Onset   . Cancer Mother 39        breast cancer   . Breast cancer Mother    . Heart disease Father    . Malignant hyperthermia Neg Hx    . Pseudochol  deficiency Neg Hx      Social History     Social History   . Marital status: Single     Spouse name: N/A   . Number of children: 2   . Years of education: N/A     Occupational History   . legal instrument examiner [employer]      Social History Main Topics   . Smoking status: Never Smoker   . Smokeless tobacco: Never Used   . Alcohol use No   . Drug use: No   . Sexual activity: No     Other Topics Concern   . Dietary Supplements / Vitamins Yes   . Anesthesia Problems No   . Blood Thinners No   . Eats Large Amounts No   . Excessive Sweets No   . Skips Meals No   . Eats Excessive Starches Yes   . Snacks Or Grazes No   .  Emotional Eater Yes   . Eats Fried Food Yes   . Eats Fast Food Yes   . Diet Center No   . Hmr No   . [name] No   . La Weight Loss No   . Nutri-System No   . Opti-Fast / Medi-Fast No   . Overeaters Anonymous No   . Physicians Weight Loss Center No   . Tops No   . Weight Watchers No   . Atkins No   . Binging / Purging No   . Body For Life No   . Cabbage Soup No   . Calorie Counting No   . Fasting No   . [name] No   . Health Spa No   . Herbal Life No   . High Protein No   . Low Carb No   . Low Fat No   . Mayo [facility] Diet No   . [place] Diet No   . Margie Billet Diet No   . [place] Diet No   . Slim Fast No   . [place] No   . Sugar Busters No   . Vomiting No   . Zone Diet No   . Stationary Cycle Or Treadmill No   . Gym/Fitness Classes No   . Home Exercise/Video No   . Swimming No   . Team Sports No   . Weight Training No   . Walking Or Running No   . Hospitalization No   . Hypnosis No   . Physical Therapy No   . Psychological Therapy No   . Residential Program No   . Acutrim No   . Amphetamines No   . Anorex No   . Byetta No   . Dexatrim No   . Didrex No   . Fastin No   . Fen - Phen No   . Ionamin / Adipex No   . Mazanor No   . Meridia No   . Obalan No   . Phendiet No   . Phentrol No   . Phenteramine Yes   . Plegine No   . Pondimin No   . Qsymia No   . Prozac No   . Redux No    . Sanorex No   . Ten[place] No   . Tepanole No   . Wechless No   . Wellbutrin No   . Xenical (Orlistat, Alli) No   . Other Med No   . No Impairment Yes     Chronic Bilat Knee Pain   . Walks With Cane/Crutch Yes   . Requires A Wheelchair No   . Bedridden No   . Are You Currently Being Treated For Depression? No   . Do You Snore? Yes   . Are You Receiving Any Medical Or Psychological Services? No   . Do You Have Or Have You Been Treated For An Eating Disorder? No   . Do You Exercise Regularly? No   . Have You Or Family Member Ever Have Trouble With Anesthesia? No     Social History Narrative   . No narrative on file     The following portions of the patient's history were reviewed and updated as appropriate: allergies, current medications, past family history, past medical history, past social history, past surgical history and problem list.    Review of Systems   Constitutional: Positive for fatigue. Negative for chills and fever.   Respiratory: Positive for shortness of breath. Negative for cough.  Cardiovascular: Positive for chest pain and palpitations.        Objective:   BP 144/87 (BP Site: Right arm)   Pulse (!) 59   Temp 98 F (36.7 C) (Oral)   Ht 1.549 m (5\' 1" )   Wt 59.9 kg (132 lb)   SpO2 99%   BMI 24.94 kg/m   Wt Readings from Last 3 Encounters:   04/14/18 59.9 kg (132 lb)   03/16/18 60.4 kg (133 lb 3.2 oz)   03/08/18 59.5 kg (131 lb 2.8 oz)     Physical Exam   Constitutional: She appears well-developed and well-nourished. She does not appear ill. No distress.   Neck: Neck supple. Carotid bruit is not present. No thyroid mass and no thyromegaly present.   Cardiovascular: Normal rate, regular rhythm, S1 normal and S2 normal.    No murmur heard.  There is no peripheral edema noted in the lower extremities.      Pulmonary/Chest: Effort normal and breath sounds normal. She has no decreased breath sounds. She has no wheezes. She has no rhonchi. She has no rales.   Lymphadenopathy:     She has no  cervical adenopathy.        Right: No supraclavicular adenopathy present.        Left: No supraclavicular adenopathy present.   Skin: Skin is warm, dry and intact. She is not diaphoretic. No cyanosis. No pallor. Nails show no clubbing.   Nursing note and vitals reviewed.       Assessment/Plan:       1. Essential hypertension  Uncontrolled  Adverse reactions to multiple anti-HTN medication  Recent increase in atenolol 3 days ago  Continue current med  Continue home BP monitoring, keep log  Has upcoming appointment with cards 04/27/2018    2. Transient cerebral ischemia, unspecified type    3. Palpitations  Stable  Wearing a heart monitor  Follow-up with cards to see monitor findings    4. Obstructive sleep apnea syndrome    5. Dyslipidemia  Intolerant of statins  Lab Results   Component Value Date    CHOL 151 01/25/2018    CHOL 175 11/10/2017    TRIG 26 (L) 01/25/2018    TRIG 53 11/10/2017    TRIG 55 04/30/2017    TRIG 63 09/25/2015    HDL 53 01/25/2018    HDL 72 11/10/2017    LDL 93 01/25/2018    LDL 89 11/10/2017    LDL 213 (H) 02/07/2007     Recommend optimizing low carbohydrate, heart healthy diet and aerobic exercise measures.    Declines pneumovax      Keep current followup with PCP Dr. Daivd Council 7/92019, return sooner if needed    Jules Husbands, DO

## 2018-04-14 NOTE — Progress Notes (Signed)
Have you seen any specialists/other providers since your last visit with [place]?    Yes, gastro    Arm preference verified?   Yes    The patient is due for mammogram, shingles vaccine, pneumonia vaccine and DXA scan, Hep C screening, CPL, Medicare Wellness

## 2018-04-16 ENCOUNTER — Encounter (INDEPENDENT_AMBULATORY_CARE_PROVIDER_SITE_OTHER): Payer: Self-pay | Admitting: Family Medicine

## 2018-04-19 ENCOUNTER — Ambulatory Visit (INDEPENDENT_AMBULATORY_CARE_PROVIDER_SITE_OTHER): Payer: Medicare Other | Admitting: Cardiovascular Disease

## 2018-04-19 DIAGNOSIS — R002 Palpitations: Secondary | ICD-10-CM

## 2018-04-19 NOTE — Procedures (Signed)
[facility] - [facility]      Amber [name]         [id number], female, 13-Mar-1951    Ordering Physician:  Particia Nearing, MD  Primary Physician:  Oneita Jolly, MD  Primary Cardiologist:  Particia Nearing, MD    Indication:  pALPITATIONS    Data     Hook up date:  03/19/2018        Disconnect date:  04/17/2018  Total days of monitoring:  30  Quality:  good   Tech comments:      Findings     Baseline rhythm:  Sinus rhythm  Arrhythmia:  Isolated PVC    Symptoms reported:  diary not submitted  Symptom correlation with arrhythmia:  diary not submitted    Impression      Normal sinus rhythm.   Isolated premature ventricular contraction (PVC).   No significant arrhythmias during the 30 day monitoring period.      Interpreted and electronically signed by:  Renard Hamper, MD, MS

## 2018-04-27 ENCOUNTER — Ambulatory Visit (INDEPENDENT_AMBULATORY_CARE_PROVIDER_SITE_OTHER): Payer: Medicare Other | Admitting: Cardiology

## 2018-04-27 ENCOUNTER — Encounter (INDEPENDENT_AMBULATORY_CARE_PROVIDER_SITE_OTHER): Payer: Self-pay | Admitting: Cardiology

## 2018-04-27 VITALS — BP 131/86 | HR 65 | Temp 97.8°F | Wt 133.4 lb

## 2018-04-27 DIAGNOSIS — R079 Chest pain, unspecified: Secondary | ICD-10-CM

## 2018-04-27 DIAGNOSIS — R002 Palpitations: Secondary | ICD-10-CM

## 2018-04-27 DIAGNOSIS — I10 Essential (primary) hypertension: Secondary | ICD-10-CM

## 2018-04-27 MED ORDER — ATENOLOL 100 MG PO TABS
100.0000 mg | ORAL_TABLET | Freq: Every day | ORAL | 3 refills | Status: DC
Start: 2018-04-27 — End: 2018-07-07

## 2018-04-27 NOTE — Progress Notes (Signed)
Amber [name] [phone]. with a history of HTN presents for cardiac follow up      Recent visit to [facility] for chest pain, HTN. Troponin (-). Discharged home    Cardiac work up has included  - stress test (11/06/17 - normal myocardial perfusion)  - cardiac cath ( 01/08/14 - normal )  - echocardiogram ( EF 55%, mild TR)  - 24 hour holter - no significant events  CVD   - hx of TIA - occurred when not on ASA   - no recurrence    HTN   - elevated at home    - currently on atenolol 75 mg daily      Palpitations  - daily  - no syncope  - no significant events on 30 day event monitor    Chest pain  - atypical  - normal cardiac cath        Past Medical History:   Diagnosis Date   . Asthma    . Chest pain 2016     Chest pain after eating x6  months--being followed by GI for GERD   . Constipation    . Difficulty walking     amb with cane secondary to arthritis bil knees   . Genital herpes     no current outbreak (10/03/15)   . GERD (gastroesophageal reflux disease)    . Hiatal hernia     h/o surgery   . Hyperlipidemia    . Hypertensive disorder     Well controlled on med. Denies any SOB in last 6 months (10/03/15)   . Low back pain    . Morbid obesity with BMI of 40.0-44.9, adult     BMI 40.9   . Nausea without vomiting    . OSA on CPAP 2015    CPAP nightly x 1 yr   . Osteoarthritis of knees, bilateral     and back   . TIA (transient ischemic attack) 2014    TIA 2014-no residual. Patient evaulated for possible TIA 07/12/2015  @ [facility] (dischage summary in epic)-per summary CT of head was normal and patient was d/c'd     Family History   Problem Relation Age of Onset   . Cancer Mother 14        breast cancer   . Breast cancer Mother    . Heart disease Father    . Malignant hyperthermia Neg Hx    . Pseudochol deficiency Neg Hx      Current Outpatient Prescriptions   Medication Sig Dispense Refill   . atenolol  (TENORMIN) 100 MG tablet Take 1 tablet (100 mg total) by mouth daily 90 tablet 3   . albuterol (PROVENTIL HFA;VENTOLIN HFA) 108 (90 BASE) MCG/ACT inhaler Inhale into the lungs every 4 (four) hours as needed.        Marland Kitchen aspirin EC 81 MG EC tablet Take 1 tablet (81 mg total) by mouth daily. 30 tablet 1   . B Complex Vitamins (VITAMIN-B COMPLEX) Tab Take  by Mouth every day     . cholecalciferol (VITAMIN D-1000 MAX ST) 1000 units tablet Take 1,000 Units by Mouth Once a Day at lunch time     . diazePAM (VALIUM) 2 MG tablet Take 1 tablet (2 mg total) by mouth every 8 (eight) hours as needed for Anxiety. 30 tablet 2   . fluticasone (FLONASE) 50 MCG/ACT nasal spray 2 sprays by Nasal route daily. (Patient taking differently: 2 sprays by Nasal route as needed.   )  16 g 2   . Multiple Vitamin (MULTIVITAMIN) capsule Take 1 capsule by mouth daily     . ondansetron (ZOFRAN-ODT) 4 MG disintegrating tablet Take 1 tablet (4 mg total) by mouth every 6 (six) hours as needed for Nausea 8 tablet 0   . pantoprazole (PROTONIX) 40 MG tablet TAKE 1 TABLET(40 MG) BY MOUTH DAILY 90 tablet 0   . valACYclovir HCL (VALTREX) 500 MG tablet TAKE 1 TABLET(500 MG) BY MOUTH DAILY (Patient taking differently: TAKE 1 TABLET(500 MG) BY MOUTH DAILY PRN) 90 tablet 0   . vitamin A 47829 UNIT capsule Take 10,000 Units by mouth daily     . vitamin B-12 (CYANOCOBALAMIN) 100 MCG tablet Take 50 mcg by mouth every other day       No current facility-administered medications for this visit.           PE:    Vitals:    04/27/18 1458   BP: 131/86   Pulse: 65   Temp:    SpO2:      Body mass index is 25.21 kg/m.    General: nad  cv rr  Lung cta  abd s  Ext no edema      Labs:  Lipid Panel   Cholesterol   Date/Time Value Ref Range Status   01/25/2018 05:06 AM 151 0 - 199 mg/dL Final     Triglycerides   Date/Time Value Ref Range Status   01/25/2018 05:06 AM 26 (L) 34 - 149 mg/dL Final     HDL   Date/Time Value Ref Range Status   01/25/2018 05:06 AM 53 40 - 9,999 mg/dL  Final     Comment:     An HDL cholesterol <40 mg/dL is low and constitutes a  coronary heart disease risk factor, and HDL-C>59 mg/dL is  a negative risk factor for CHD.  Ref: American Heart Association; Circulation 2004         CMP:   Sodium   Date/Time Value Ref Range Status   03/08/2018 01:11 PM 144 136 - 145 mEq/L Final     Potassium   Date/Time Value Ref Range Status   03/08/2018 01:11 PM 4.5 3.5 - 5.1 mEq/L Final     Chloride   Date/Time Value Ref Range Status   03/08/2018 01:11 PM 108 100 - 111 mEq/L Final   11/10/2017 07:40 AM 107 98 - 110 mmol/L Final     CO2   Date/Time Value Ref Range Status   03/08/2018 01:11 PM 27 22 - 29 mEq/L Final     Glucose   Date/Time Value Ref Range Status   03/08/2018 01:11 PM 85 70 - 100 mg/dL Final     Comment:     ADA guidelines for diabetes mellitus:  Fasting:  Equal to or greater than 126 mg/dL  Random:   Equal to or greater than 200 mg/dL       BUN   Date/Time Value Ref Range Status   03/08/2018 01:11 PM 8 7 - 19 mg/dL Final     Protein, Total   Date/Time Value Ref Range Status   03/08/2018 01:11 PM 6.6 6.0 - 8.3 g/dL Final   56/21/3086 57:84 AM 6.4 6.1 - 8.1 g/dL Final     Alkaline Phosphatase   Date/Time Value Ref Range Status   03/08/2018 01:11 PM 88 37 - 106 U/L Final     AST (SGOT)   Date/Time Value Ref Range Status   03/08/2018 01:11 PM 18 5 - 34 U/L  Final     ALT   Date/Time Value Ref Range Status   03/08/2018 01:11 PM 13 0 - 55 U/L Final     Anion Gap   Date/Time Value Ref Range Status   03/08/2018 01:11 PM 9.0 5.0 - 15.0 Final       CBC:   WBC   Date/Time Value Ref Range Status   03/08/2018 01:11 PM 4.58 3.10 - 9.50 x10 3/uL Final   06/30/2009 04:03 PM 9.12 3.50 - 10.80 /CUMM Final     RBC   Date/Time Value Ref Range Status   03/08/2018 01:11 PM 4.50 3.90 - 5.10 x10 6/uL Final     Hemoglobin   Date/Time Value Ref Range Status   11/10/2017 07:40 AM 12.5 11.7 - 15.5 g/dL Final     Hgb   Date/Time Value Ref Range Status   03/08/2018 01:11 PM 13.0 11.4 - 14.8 g/dL  Final     Hematocrit   Date/Time Value Ref Range Status   03/08/2018 01:11 PM 39.6 34.7 - 43.7 % Final     MCV   Date/Time Value Ref Range Status   03/08/2018 01:11 PM 88.0 78.0 - 96.0 fL Final     MCHC   Date/Time Value Ref Range Status   03/08/2018 01:11 PM 32.8 31.5 - 35.8 g/dL Final     RDW   Date/Time Value Ref Range Status   03/08/2018 01:11 PM 14 11 - 15 % Final     Platelets   Date/Time Value Ref Range Status   03/08/2018 01:11 PM 205 142 - 346 x10 3/uL Final   11/10/2017 07:40 AM 266 140 - 400 Thousand/uL Final           Impression / plan    HTN   - uncontrolled   - increase atenolol 100 mg daily  Chest pain   - noncardiac   - normal cardiac cath      rtc 3 months    Richard Holz Dan Humphreys  04/27/2018

## 2018-04-28 ENCOUNTER — Telehealth (INDEPENDENT_AMBULATORY_CARE_PROVIDER_SITE_OTHER): Payer: Self-pay

## 2018-04-28 NOTE — Telephone Encounter (Signed)
Satisfactory evm

## 2018-05-03 ENCOUNTER — Encounter (INDEPENDENT_AMBULATORY_CARE_PROVIDER_SITE_OTHER): Payer: Self-pay | Admitting: Internal Medicine

## 2018-05-03 ENCOUNTER — Ambulatory Visit (INDEPENDENT_AMBULATORY_CARE_PROVIDER_SITE_OTHER): Payer: Medicare Other | Admitting: Internal Medicine

## 2018-05-03 VITALS — BP 144/82 | HR 70 | Temp 97.4°F | Ht 61.0 in | Wt 133.2 lb

## 2018-05-03 DIAGNOSIS — T148XXA Other injury of unspecified body region, initial encounter: Secondary | ICD-10-CM

## 2018-05-03 MED ORDER — NYSTATIN 100000 UNIT/GM EX POWD
CUTANEOUS | 1 refills | Status: AC
Start: 2018-05-03 — End: 2019-05-03

## 2018-05-03 NOTE — Progress Notes (Signed)
Have you seen any specialists/other providers since your last visit with [place]?    Yes, cardiology    Arm preference verified?   Yes    The patient is due for Hep c screening, shingrix, mammogram, medicare wellness, Dx scan, Pna vaccine, and care plan letter

## 2018-05-03 NOTE — Progress Notes (Signed)
Subjective:       Patient ID: Amber [name] is a 67 y.o. female.    HPI  Pt c/o    Noted blood in the buttock crease few days ago, some pain,     The following portions of the patient's history were reviewed and updated as appropriate: allergies, current medications, past family history, past medical history, past social history, past surgical history and problem list.    Review of Systems   Constitutional: Negative for appetite change, chills and fever.   Respiratory: Negative for shortness of breath.    Cardiovascular: Negative for chest pain.   Gastrointestinal: Negative for abdominal pain, constipation, diarrhea, nausea and vomiting.     BP 144/82   Pulse 70   Temp 97.4 F (36.3 C)   Ht 1.549 m (5\' 1" )   Wt 60.4 kg (133 lb 3.2 oz)   BMI 25.17 kg/m        Objective:    Physical Exam   Skin:   +small area of skin abrasion, in the crease of buttocks, tender, no active bleeding, no discharge           Assessment:       1. Skin abrasion  nystatin (NYSTOP) powder          Plan:      Procedures  No orders of the defined types were placed in this encounter.      rx nystatin powder  Keep area dry and clean  F/u if sx persistent/worse.

## 2018-05-10 ENCOUNTER — Encounter (INDEPENDENT_AMBULATORY_CARE_PROVIDER_SITE_OTHER): Payer: Self-pay

## 2018-05-10 ENCOUNTER — Ambulatory Visit (INDEPENDENT_AMBULATORY_CARE_PROVIDER_SITE_OTHER): Payer: Medicare Other | Admitting: Internal Medicine

## 2018-05-13 ENCOUNTER — Other Ambulatory Visit: Payer: Self-pay | Admitting: Gastroenterology

## 2018-05-13 DIAGNOSIS — R1013 Epigastric pain: Secondary | ICD-10-CM

## 2018-05-14 ENCOUNTER — Other Ambulatory Visit (INDEPENDENT_AMBULATORY_CARE_PROVIDER_SITE_OTHER): Payer: Self-pay | Admitting: Internal Medicine

## 2018-05-20 ENCOUNTER — Ambulatory Visit
Admission: RE | Admit: 2018-05-20 | Discharge: 2018-05-20 | Disposition: A | Discharge status: Home / Self Care | Payer: Medicare Other | Source: Ambulatory Visit | Attending: Gastroenterology | Admitting: Gastroenterology

## 2018-05-20 ENCOUNTER — Other Ambulatory Visit: Payer: Self-pay | Admitting: Gastroenterology

## 2018-05-20 DIAGNOSIS — R1013 Epigastric pain: Secondary | ICD-10-CM | POA: Insufficient documentation

## 2018-05-20 MED ORDER — BARIUM SULFATE 98 % PO SUSR
140.00 mL | Freq: Once | ORAL | Status: AC | PRN
Start: 2018-05-20 — End: 2018-05-20
  Administered 2018-05-20: 09:00:00 140 mL via ORAL

## 2018-05-20 MED ORDER — SOD BICARB-CITRIC AC-SIMETH 2.21-1.53-0.04 G PO PACK
1.00 | PACK | Freq: Once | ORAL | Status: AC | PRN
Start: 2018-05-20 — End: 2018-05-20
  Administered 2018-05-20: 09:00:00 1 via ORAL

## 2018-05-20 MED ORDER — BARIUM SULFATE 60 % PO SUSP
355.00 mL | Freq: Once | ORAL | Status: AC | PRN
Start: 2018-05-20 — End: 2018-05-20
  Administered 2018-05-20: 09:00:00 355 mL via ORAL

## 2018-06-02 ENCOUNTER — Other Ambulatory Visit (HOSPITAL_BASED_OUTPATIENT_CLINIC_OR_DEPARTMENT_OTHER): Payer: Self-pay

## 2018-06-10 ENCOUNTER — Encounter (INDEPENDENT_AMBULATORY_CARE_PROVIDER_SITE_OTHER): Payer: Self-pay

## 2018-06-16 ENCOUNTER — Emergency Department: Payer: Medicare Other

## 2018-06-16 ENCOUNTER — Emergency Department
Admission: EM | Admit: 2018-06-16 | Discharge: 2018-06-16 | Disposition: A | Discharge status: Home / Self Care | Payer: Medicare Other | Attending: Emergency Medicine | Admitting: Emergency Medicine

## 2018-06-16 DIAGNOSIS — E785 Hyperlipidemia, unspecified: Secondary | ICD-10-CM | POA: Insufficient documentation

## 2018-06-16 DIAGNOSIS — K219 Gastro-esophageal reflux disease without esophagitis: Secondary | ICD-10-CM | POA: Insufficient documentation

## 2018-06-16 DIAGNOSIS — R079 Chest pain, unspecified: Secondary | ICD-10-CM

## 2018-06-16 DIAGNOSIS — R42 Dizziness and giddiness: Secondary | ICD-10-CM | POA: Insufficient documentation

## 2018-06-16 DIAGNOSIS — Z79899 Other long term (current) drug therapy: Secondary | ICD-10-CM | POA: Insufficient documentation

## 2018-06-16 DIAGNOSIS — R9431 Abnormal electrocardiogram [ECG] [EKG]: Secondary | ICD-10-CM

## 2018-06-16 DIAGNOSIS — I10 Essential (primary) hypertension: Secondary | ICD-10-CM | POA: Insufficient documentation

## 2018-06-16 DIAGNOSIS — R001 Bradycardia, unspecified: Secondary | ICD-10-CM

## 2018-06-16 DIAGNOSIS — Z7982 Long term (current) use of aspirin: Secondary | ICD-10-CM | POA: Insufficient documentation

## 2018-06-16 DIAGNOSIS — R0789 Other chest pain: Secondary | ICD-10-CM | POA: Insufficient documentation

## 2018-06-16 DIAGNOSIS — Z8673 Personal history of transient ischemic attack (TIA), and cerebral infarction without residual deficits: Secondary | ICD-10-CM | POA: Insufficient documentation

## 2018-06-16 DIAGNOSIS — Z7951 Long term (current) use of inhaled steroids: Secondary | ICD-10-CM | POA: Insufficient documentation

## 2018-06-16 LAB — URINALYSIS
Bilirubin, UA: NEGATIVE
Blood, UA: NEGATIVE
Glucose, UA: NEGATIVE
Ketones UA: NEGATIVE
Leukocyte Esterase, UA: NEGATIVE
Nitrite, UA: NEGATIVE
Protein, UR: NEGATIVE
Specific Gravity UA: 1.015 (ref 1.001–1.035)
Urine pH: 9 — AB (ref 5.0–8.0)
Urobilinogen, UA: <2 mg/dL

## 2018-06-16 LAB — HEPATIC FUNCTION PANEL
ALT: 14 U/L (ref 0–55)
AST (SGOT): 24 U/L (ref 5–34)
Albumin/Globulin Ratio: 1.3 (ref 0.9–2.2)
Albumin: 3.7 g/dL (ref 3.5–5.0)
Alkaline Phosphatase: 86 U/L (ref 37–106)
Bilirubin Direct: 0.2 mg/dL (ref 0.0–0.5)
Bilirubin Indirect: 0.4 mg/dL (ref 0.2–1.0)
Bilirubin, Total: 0.6 mg/dL (ref 0.2–1.2)
Globulin: 2.9 g/dL (ref 2.0–3.6)
Protein, Total: 6.6 g/dL (ref 6.0–8.3)

## 2018-06-16 LAB — CBC AND DIFFERENTIAL
Absolute NRBC: 0 10*3/uL (ref 0.00–0.00)
Basophils Absolute Automated: 0.03 10*3/uL (ref 0.00–0.08)
Basophils Automated: 0.8 %
Eosinophils Absolute Automated: 0.09 10*3/uL (ref 0.00–0.44)
Eosinophils Automated: 2.5 %
Hematocrit: 37.5 % (ref 34.7–43.7)
Hgb: 12.1 g/dL (ref 11.4–14.8)
Immature Granulocytes Absolute: 0 10*3/uL (ref 0.00–0.07)
Immature Granulocytes: 0 %
Lymphocytes Absolute Automated: 1.03 10*3/uL (ref 0.42–3.22)
Lymphocytes Automated: 28.6 %
MCH: 28.3 pg (ref 25.1–33.5)
MCHC: 32.3 g/dL (ref 31.5–35.8)
MCV: 87.6 fL (ref 78.0–96.0)
MPV: 10.2 fL (ref 8.9–12.5)
Monocytes Absolute Automated: 0.39 10*3/uL (ref 0.21–0.85)
Monocytes: 10.8 %
Neutrophils Absolute: 2.06 10*3/uL (ref 1.10–6.33)
Neutrophils: 57.3 %
Nucleated RBC: 0 /100 WBC (ref 0.0–0.0)
Platelets: 228 10*3/uL (ref 142–346)
RBC: 4.28 10*6/uL (ref 3.90–5.10)
RDW: 14 % (ref 11–15)
WBC: 3.6 10*3/uL (ref 3.10–9.50)

## 2018-06-16 LAB — ECG 12-LEAD
Atrial Rate: 57 {beats}/min
Atrial Rate: 55 {beats}/min
P Axis: 64 degrees
P Axis: 37 degrees
P-R Interval: 168 ms
P-R Interval: 164 ms
Q-T Interval: 418 ms
Q-T Interval: 426 ms
QRS Duration: 82 ms
QRS Duration: 88 ms
QTC Calculation (Bezet): 406 ms
QTC Calculation (Bezet): 407 ms
R Axis: -12 degrees
R Axis: -11 degrees
T Axis: 12 degrees
T Axis: 1 degrees
Ventricular Rate: 57 {beats}/min
Ventricular Rate: 55 {beats}/min

## 2018-06-16 LAB — BASIC METABOLIC PANEL
Anion Gap: 10 (ref 5.0–15.0)
BUN: 8 mg/dL (ref 7.0–19.0)
CO2: 23 mEq/L (ref 22–29)
Calcium: 9 mg/dL (ref 8.5–10.5)
Chloride: 108 mEq/L (ref 100–111)
Creatinine: 0.6 mg/dL (ref 0.6–1.0)
Glucose: 95 mg/dL (ref 70–100)
Potassium: 3.9 mEq/L (ref 3.5–5.1)
Sodium: 141 mEq/L (ref 136–145)

## 2018-06-16 LAB — LIPASE: Lipase: 9 U/L (ref 8–78)

## 2018-06-16 LAB — MAGNESIUM: Magnesium: 2 mg/dL (ref 1.6–2.6)

## 2018-06-16 LAB — GFR: EGFR: >60

## 2018-06-16 LAB — TROPONIN I
Troponin I: <0.01 ng/mL (ref 0.00–0.09)
Troponin I: <0.01 ng/mL (ref 0.00–0.09)

## 2018-06-16 MED ORDER — SODIUM CHLORIDE 0.9 % IV BOLUS
1000.00 mL | Freq: Once | INTRAVENOUS | Status: AC
Start: 2018-06-16 — End: 2018-06-16
  Administered 2018-06-16: 10:00:00 1000 mL via INTRAVENOUS

## 2018-06-16 MED ORDER — ALUM & MAG HYDROXIDE-SIMETH 200-200-20 MG/5ML PO SUSP
30.00 mL | Freq: Once | ORAL | Status: AC
Start: 2018-06-16 — End: 2018-06-16
  Administered 2018-06-16: 11:00:00 30 mL via ORAL
  Filled 2018-06-16: qty 30

## 2018-06-16 MED ORDER — LIDOCAINE VISCOUS HCL 2 % MT SOLN
10.00 mL | Freq: Once | OROMUCOSAL | Status: AC
Start: 2018-06-16 — End: 2018-06-16
  Administered 2018-06-16: 11:00:00 10 mL via OROMUCOSAL
  Filled 2018-06-16: qty 15

## 2018-06-16 MED ORDER — HYDRALAZINE HCL 20 MG/ML IJ SOLN
5.00 mg | Freq: Once | INTRAMUSCULAR | Status: AC
Start: 2018-06-16 — End: 2018-06-16
  Administered 2018-06-16: 10:00:00 5 mg via INTRAVENOUS
  Filled 2018-06-16: qty 1

## 2018-06-16 NOTE — Discharge Instructions (Signed)
Dizziness, Nonspecific    You have been seen for dizziness.     Dizziness can mean different things to different people. Some people use dizziness to mean the feeling of spinning when there is no actual movement. This often causes nausea (feeling sick). The medical term for this is "vertigo." Others people use the word dizzy to mean "feeling lightheaded," like you might faint. This feeling is usually made better when lying down. For some people, neither of these describes how they are feeling. It can just be a feeling that makes you unsteady. This feeling is common in older people. It can be caused by a number of things. These include poor vision or hearing, foot problems and arthritis. It can also be caused by middle ear or sinus problems. The feeling can come and go.    Dizziness is also caused by more serious things. This includes strokes and heart problems.    It is NEVER normal to have the kind of dizziness you have today together with:   Chest pain.   Problems walking because of problems with balance. Especially if you are falling to one side.   Weakness, numbness or tingling in a part of your body.   Drooping of one side of your face.   Confusion.   Severe headache.   Problems speaking.    If you have these symptoms, it is VERY IMPORTANT to go to the nearest emergency department.    Your tests today were negative (normal). This means we found no life-threatening causes for your dizziness. It is OK for you to go home.    See your primary care doctor for more work-up of your dizziness.     YOU SHOULD SEEK MEDICAL ATTENTION IMMEDIATELY, EITHER HERE OR AT THE NEAREST EMERGENCY DEPARTMENT, IF ANY OF THE FOLLOWING OCCUR:   You cannot speak clearly (slurring), one side of your face droops or you feel weak in the arms or legs (especially on one side).   You have problems with your balance.   You have problems hearing or there is ringing or a feeling of fullness in your ear.   You lose  consciousness ("pass out" or faint).   You have severe headache with dizziness.   You have fever (temperature higher than 100.49F / 38C).   You fall and hit your head.             Hypertension, Exacerbation    You have been previously diagnosed with hypertension. Hypertension means high blood pressure that happens every day. To be diagnosed with hypertension, the blood pressure readings must be abnormally high at least 3 different times. There are causes for high blood pressure that doctors can find. These include being overweight or having a kidney or hormone problem. This is called secondary hypertension. When a doctor does not know the cause, it is called "essential hypertension." Both types of hypertension may require medicine to lower the blood pressure. Some people with high blood pressure will improve if they limit the sodium (salt) in their diets.    High blood pressure often has no symptoms. However, it can cause headaches or vision problems. In rare cases, very high blood pressure can cause seizures (fits). It is important to diagnose and treat hypertension even if there are no symptoms. This is because high blood pressure can cause damage to organs like the heart and kidneys. This damage can be permanent (not go away). That is why it is very important to take all medicines prescribed for  your condition.    See your doctor in the next 24 hours.     YOU SHOULD SEEK MEDICAL ATTENTION IMMEDIATELY, EITHER HERE OR AT THE NEAREST EMERGENCY DEPARTMENT IF ANY OF THE FOLLOWING OCCUR:   Your chest hurts or you have trouble breathing.   You have a severe headache or have trouble seeing.   You have seizures.   You vomit (throw up) repeatedly or get more ill.               Chest Pain of Unclear Etiology    You have been seen for chest pain. The cause of your pain is not yet known.    Your doctor has learned about your medical history, examined you, and checked any tests that were done. Still, it is  unclear why you are having pain. The doctor thinks there is only a very small chance that your pain is caused by a life-threatening condition. Later, your primary care doctor might do more tests or check you again.    Sometimes chest pain is caused by a dangerous condition, like a heart attack, aorta injury, blood clot in the lung, or collapsed lung. It is unlikely that your pain is caused by a life-threatening condition if: Your chest pain lasts only a few seconds at a time; you are not short of breath, nauseated (sick to your stomach), sweaty, or lightheaded; your pain gets worse when you twist or bend; your pain improves with exercise or hard work.    Chest pain is serious. It is VERY IMPORTANT that you follow up with your regular doctor and seek medical attention immediately here or at the nearest Emergency Department if your symptoms become worse or they change.    YOU SHOULD SEEK MEDICAL ATTENTION IMMEDIATELY, EITHER HERE OR AT THE NEAREST EMERGENCY DEPARTMENT, IF ANY OF THE FOLLOWING OCCURS:   Your pain gets worse.   Your pain makes you short of breath, nauseated, or sweaty.   Your pain gets worse when you walk, go up stairs, or exert yourself.   You feel weak, lightheaded, or faint.   It hurts to breathe.   Your leg swells.   Your symptoms get worse or you have new symptoms or concerns.

## 2018-06-16 NOTE — ED Provider Notes (Signed)
EMERGENCY DEPARTMENT NOTE    Physician/Midlevel provider first contact with patient: 06/16/18 0851         HISTORY OF PRESENT ILLNESS   Historian:patient  Translator Used: No    Chief Complaint: Chest Pain     Mechanism of Injury:       67 y.o. female presents emergency department the chief complaint of chest pain, dizziness, high blood pressure.  She reports that over the past couple days her blood pressure has been remaining high despite taking atenolol.  She reports that she woke up early this morning around 2-3 AM and noted to be slightly dizzy and the slight headache and her blood pressure was noted to be in the 170s systolic at that time.  She also reported some chest tightness.  Since that time she has been drifting in and out of sleep.  Finally around 4-5 AM she took her atenolol and went back to sleep.  She reported chest pain at that time as well.  She then awoke around 6-7 AM and blood pressure went from 170s systolic to 190s systolic and therefore patient decided to present to the emergency department.  She reports that her chest pain has been constant since around 4 AM.  She reports a slight generalized headache as well as dizziness.  She has no shortness of breath.  No abdominal pain or nausea or vomiting.  She has had symptoms like this before with her high blood pressure.  She is not currently a smoker.  She has no other complaints at this time.    1. Location of symptoms: Head, chest  2. Onset of symptoms: Early morning around 2 or 3 AM  3. What was patient doing when symptoms started (Context): see above  4. Severity: Moderate  5. Timing: Ongoing  6. Activities that worsen symptoms: Standing up and walking seems to make the dizziness worse  7. Activities that improve symptoms: Rest makes the dizziness better but nothing makes the chest pain better  8. Quality: Chest is described as tightness, head is described as ache and dizzy  9. Radiation of symptoms: No  10. Associated signs and Symptoms: see  above  11. Are symptoms worsening?  Yes  MEDICAL HISTORY     Past Medical History:  Past Medical History:   Diagnosis Date   . Asthma    . Chest pain 2016     Chest pain after eating x6  months--being followed by GI for GERD   . Constipation    . Difficulty walking     amb with cane secondary to arthritis bil knees   . Genital herpes     no current outbreak (10/03/15)   . GERD (gastroesophageal reflux disease)    . Hiatal hernia     h/o surgery   . Hyperlipidemia    . Hypertensive disorder     Well controlled on med. Denies any SOB in last 6 months (10/03/15)   . Low back pain    . Morbid obesity with BMI of 40.0-44.9, adult     BMI 40.9   . Nausea without vomiting    . OSA on CPAP 2015    CPAP nightly x 1 yr   . Osteoarthritis of knees, bilateral     and back   . TIA (transient ischemic attack) 2014    TIA 2014-no residual. Patient evaulated for possible TIA 07/12/2015  @ [facility] (dischage summary in epic)-per summary CT of head was normal and patient was d/c'd  Past Surgical History:  Past Surgical History:   Procedure Laterality Date   . APPENDECTOMY  >25 yrs   . CHOLECYSTECTOMY     . EGD  01/2015   . EGD, BIOPSY N/A 09/15/2017    Procedure: EGD, BIOPSY;  Surgeon: Pershing Proud, MD;  Location: [facility];  Service: Gastroenterology;  Laterality: N/A;   . fallopian tube surgery  >25 yrs   . INSERTION, PAIN PUMP (MEDICAL) N/A 10/22/2015    Procedure: INSERTION, PAIN PUMP (MEDICAL);  Surgeon: Nicola Police, DO;  Location: [facility];  Service: General;  Laterality: N/A;   . LAPAROSCOPIC, CHOLECYSTECTOMY, CHOLANGIOGRAM  08/13/2014   . LAPAROSCOPIC, GASTRIC BYPASS N/A 10/22/2015    Procedure: LAPAROSCOPIC, GASTRIC BYPASS;  Surgeon: Josefa Half R, DO;  Location: [facility];  Service: General;  Laterality: N/A;   . LAPAROSCOPIC, HERNIORRHAPHY, HIATAL N/A 10/22/2015    Procedure: LAPAROSCOPIC, HERNIORRHAPHY, HIATAL;  Surgeon: Jeanell Sparrow, Hamid R, DO;  Location: [facility];   Service: General;  Laterality: N/A;   . OVARY SURGERY Right >25 yrs   . SPINE SURGERY  11/2013    cervical HNP x 2, Dr. Bufford Buttner       Social History:  Social History     Social History   . Marital status: Single     Spouse name: N/A   . Number of children: 2   . Years of education: N/A     Occupational History   . legal instrument examiner [employer]      Social History Main Topics   . Smoking status: Never Smoker   . Smokeless tobacco: Never Used   . Alcohol use No   . Drug use: No   . Sexual activity: No     Other Topics Concern   . Dietary Supplements / Vitamins Yes   . Anesthesia Problems No   . Blood Thinners No   . Eats Large Amounts No   . Excessive Sweets No   . Skips Meals No   . Eats Excessive Starches Yes   . Snacks Or Grazes No   . Emotional Eater Yes   . Eats Fried Food Yes   . Eats Fast Food Yes   . Diet Center No   . Hmr No   . [name] No   . La Weight Loss No   . Nutri-System No   . Opti-Fast / Medi-Fast No   . Overeaters Anonymous No   . [facility] Weight Loss Center No   . Tops No   . Weight Watchers No   . Atkins No   . Binging / Purging No   . Body For Life No   . Cabbage Soup No   . Calorie Counting No   . Fasting No   . [name] No   . Health Spa No   . Herbal Life No   . High Protein No   . Low Carb No   . Low Fat No   . [facility] Diet No   . [place] Diet No   . Margie Billet Diet No   . [place] Diet No   . Slim Fast No   . [place] No   . Sugar Busters No   . Vomiting No   . Zone Diet No   . Stationary Cycle Or Treadmill No   . Gym/Fitness Classes No   . Home Exercise/Video No   . Swimming No   . Team  Sports No   . Weight Training No   . Walking Or Running No   . Hospitalization No   . Hypnosis No   . Physical Therapy No   . Psychological Therapy No   . Residential Program No   . Acutrim No   . Amphetamines No   . Anorex No   . Byetta No   . Dexatrim No   . Didrex No   . Fastin No   . Fen - Phen No   . Ionamin / Adipex No   . Mazanor No   . Meridia No   . Obalan No    . Phendiet No   . Phentrol No   . Phenteramine Yes   . Plegine No   . Pondimin No   . Qsymia No   . Prozac No   . Redux No   . Sanorex No   . Tenuate No   . Tepanole No   . Wechless No   . Wellbutrin No   . Xenical (Orlistat, Alli) No   . Other Med No   . No Impairment Yes     Chronic Bilat Knee Pain   . Walks With Cane/Crutch Yes   . Requires A Wheelchair No   . Bedridden No   . Are You Currently Being Treated For Depression? No   . Do You Snore? Yes   . Are You Receiving Any Medical Or Psychological Services? No   . Do You Have Or Have You Been Treated For An Eating Disorder? No   . Do You Exercise Regularly? No   . Have You Or Family Member Ever Have Trouble With Anesthesia? No     Social History Narrative   . No narrative on file       Family History:  Family History   Problem Relation Age of Onset   . Cancer Mother 33        breast cancer   . Breast cancer Mother    . Heart disease Father    . Malignant hyperthermia Neg Hx    . Pseudochol deficiency Neg Hx        Outpatient Medication:  Previous Medications    ALBUTEROL (PROVENTIL HFA;VENTOLIN HFA) 108 (90 BASE) MCG/ACT INHALER    Inhale into the lungs every 4 (four) hours as needed.       ASPIRIN EC 81 MG EC TABLET    Take 1 tablet (81 mg total) by mouth daily.    ATENOLOL (TENORMIN) 100 MG TABLET    Take 1 tablet (100 mg total) by mouth daily    B COMPLEX VITAMINS (VITAMIN-B COMPLEX) TAB    Take  by Mouth every day    CHOLECALCIFEROL (VITAMIN D-1000 MAX ST) 1000 UNITS TABLET    Take 1,000 Units by Mouth Once a Day at lunch time    DIAZEPAM (VALIUM) 2 MG TABLET    Take 1 tablet (2 mg total) by mouth every 8 (eight) hours as needed for Anxiety.    FLUTICASONE (FLONASE) 50 MCG/ACT NASAL SPRAY    2 sprays by Nasal route daily.    MULTIPLE VITAMIN (MULTIVITAMIN) CAPSULE    Take 1 capsule by mouth daily    NYSTATIN (NYSTOP) POWDER    Apply to affected area 3 times daily    ONDANSETRON (ZOFRAN-ODT) 4 MG DISINTEGRATING TABLET    Take 1 tablet (4 mg total) by mouth  every 6 (six) hours as needed for Nausea    PANTOPRAZOLE (PROTONIX) 40 MG  TABLET    TAKE 1 TABLET(40 MG) BY MOUTH DAILY    VALACYCLOVIR HCL (VALTREX) 500 MG TABLET    TAKE 1 TABLET(500 MG) BY MOUTH DAILY    VITAMIN A 16606 UNIT CAPSULE    Take 10,000 Units by mouth daily    VITAMIN B-12 (CYANOCOBALAMIN) 100 MCG TABLET    Take 50 mcg by mouth every other day         REVIEW OF SYSTEMS   Review of Systems   Constitutional: Negative for fever.   HENT: Negative for sore throat.    Eyes: Negative for discharge and redness.   Respiratory: Negative for cough and shortness of breath.    Cardiovascular: Positive for chest pain.   Gastrointestinal: Negative for abdominal pain, nausea and vomiting.   Genitourinary: Negative for dysuria and urgency.   Musculoskeletal: Positive for joint pain (left knee). Negative for back pain, myalgias and neck pain.   Skin: Negative for rash.   Neurological: Positive for dizziness and headaches. Negative for sensory change, speech change, focal weakness, seizures and loss of consciousness.   Psychiatric/Behavioral: Negative for memory loss.   All other systems reviewed and are negative.             PHYSICAL EXAM     ED Triage Vitals [06/16/18 0856]   Enc Vitals Group      BP 193/88      Heart Rate (!) 58      Resp Rate 16      Temp 98 F (36.7 C)      Temp src       SpO2 100 %      Weight 59.9 kg      Height       Head Circumference       Peak Flow       Pain Score 10      Pain Loc       Pain Edu?       Excl. in GC?      Nursing note and vitals reviewed.  Constitutional:  Well developed, well nourished. alert and awake  Head:  Atraumatic. Normocephalic.    Eyes:  PERRL. EOMI. No scleral icterus  ENT:  Mucous membranes are moist and intact. Oropharynx is clear.  External ears normal. Patent airway.  Neck:  Supple. Full ROM.    Cardiovascular: Bradycardia. Regular rhythm. No murmurs, rubs, or gallops.  Pulmonary/Chest:  No evidence of respiratory distress. Clear to auscultation bilaterally.  No  wheezing, rales or rhonchi.   Abdominal:  Soft and non-distended. No tenderness. No rebound, guarding, or rigidity.  Back:  Full ROM. Nontender. No Costovertebral Tenderness.  Extremities:  No edema. No cyanosis. Full range of motion in all extremities.  Skin:  Skin is warm and dry.  No diaphoresis. No rash.   Neurological:  Alert, awake, and appropriate. Normal speech. Motor grossly normal. Cranial Nerves grossly intact by observation.  No pronator drift.Normal heel to shin, normal rapid alternating hand movements, normal finger to nose. Cranial Nerves 2-12 intact as tested. Normal gait. Negative Romberg.  Patient does report dizziness upon standing and certain movements.  Psychiatric:  Good eye contact. Normal interaction, affect, and behavior.          MEDICAL DECISION MAKING     DISCUSSION    Patient reports to the ER for evaluation of elevated blood pressure with symptoms of chest tightness and headache and dizziness.  EKG shows no evidence of STEMI.  Laboratory studies will be  obtained.  Patient was given hydralazine for elevated blood pressure of 193/88 today.  Blood pressure trending down slowly in the emergency department.  CT scan of the head is normal.  Chest x-ray shows no evidence of significant cardiomegaly.  Patient does have some headache on repeat examination.  She reports the headache is frontal and there is no evidence of temporal artery tenderness.  She also reports some dizziness that she describes as "wooziness."  She denies any room spinning or vertigo symptoms.  Offered oral medications for her headache but patient declines.  Patient has been able to ambulate back and forth to the bathroom without difficulty.  She has a normal gait on my examination.  There is no focal neurological weakness or lateralizing sign.  She is nontoxic and well-appearing.  Troponin obtained at 5 hours after onset of chest pain as well as 6 hours after onset of chest pain and patient's troponin was negative x2.   There is no evidence of endorgan damage.  Patient's blood pressure trended down appropriately.  She is to call her primary care physician for close follow-up and potential adjustment of medications.  Patient is nontoxic and well-appearing.  She was given strict return precautions and follow-up instructions.  Patient and husband agree with disposition and plan.  Stable for outpatient management symptoms.    Note patient did have one episode of vomiting in the emergency department.  She does have a history of gastric bypass.  She has no gallbladder.  She was given a GI cocktail for symptom relief.  She states that the vomiting happened after a coughing episode.  She was coughing and coughed up/vomited a small amount of thick white mucus.  Abdominal pain slowly eased after that time.  After GI cocktail the abdominal pain completely resolved.  Patient has no ataxia.  No ongoing dizziness.  Dizziness/wooziness appears to be worse with standing.  She was given IV fluids and again offered pain medication but patient declined and wanted to go home where she could eat food and then take medicine.  Patient is stable for outpatient management symptoms.  Patient and husband agree with disposition and plan.    Vital Signs: Reviewed the patient?s vital signs.   Nursing Notes: Reviewed and utilized available nursing notes.  Medical Records Reviewed: Reviewed available past medical records.  Counseling: The emergency provider has spoken with the patient and discussed today?s findings, in addition to providing specific details for the plan of care.  Questions are answered and there is agreement with the plan.        CARDIAC STUDIES    The following cardiac studies were independently interpreted by the Emergency Medicine Physician.  For full cardiac study results please see chart.      EKG Interpretation:  Signed and interpreted byED Physician   Time Interpreted: 8756  Comparison: 03/08/2018, no significant acute interval changes when  compared with previous EKG  Rate: 57  Rhythm: Sinus bradycardia  Axis: normal  Intervals: normal  Blocks: None  ST segments: Nonspecific ST/T wave changes without acute evidence of ST elevation MI.  T wave inversion in lead III.  Interpretation: Nonspecific  EKG with sinus bradycardia and meets criteria for left ventricular hypertrophy.      Interpreted by me the emergency department physician.  Repeat EKG at 1034 shows no evidence of ST elevation MI, continued sinus bradycardia with rate of 55.  Nonspecific ST/T wave changes, T wave inversion in lead III.  No significant change when compared with  previous EKG  EMERGENCY IMAGING STUDIES    The following imagine studies were independently interpreted by me (emergency physician):    Radiology:  Interpreted by me (ED Physician)  Study: Chest Xray   Results: No infiltrate. No pneumothorax. No hemothorax. No cardiomegaly. No CHF.  Impression: No acute intrathoracic abnormality.      RADIOLOGY IMAGING STUDIES      XR Chest AP Only   Final Result    No active disease      Kinnie Feil, MD    06/16/2018 10:15 AM      CT Head without Contrast   Final Result    No hemorrhage or acute abnormality. Stable chronic changes      Heron Nay, MD    06/16/2018 9:45 AM              PULSE OXIMETRY    Oxygen Saturation by Pulse Oximetry: 100%  Interventions: none  Interpretation: Normal    EMERGENCY DEPT. MEDICATIONS      ED Medication Orders     Start Ordered     Status Ordering Provider    06/16/18 1029 06/16/18 1028  lidocaine viscous (XYLOCAINE) 2 % solution 10 mL  Once     Route: Mouth/Throat  Ordered Dose: 10 mL     Last MAR action:  Given Sonal Dorwart M    06/16/18 1029 06/16/18 1028  alum & mag hydroxide-simethicone (MAALOX PLUS) 200-200-20 mg/5 mL suspension 30 mL  Once     Route: Oral  Ordered Dose: 30 mL     Last MAR action:  Given Emeric Novinger M    06/16/18 0912 06/16/18 0911  hydrALAZINE (APRESOLINE) injection 5 mg  Once     Route: Intravenous  Ordered Dose: 5 mg      Last MAR action:  Given Wyett Narine M    06/16/18 0912 06/16/18 0911  sodium chloride 0.9 % bolus 1,000 mL  Once     Route: Intravenous  Ordered Dose: 1,000 mL     Last MAR action:  New Bag Jatoria Kneeland M          LABORATORY RESULTS    Ordered and independently interpreted AVAILABLE laboratory tests. Please see results section in chart for full details.  Results for orders placed or performed during the hospital encounter of 06/16/18   Basic Metabolic Panel   Result Value Ref Range    Glucose 95 70 - 100 mg/dL    BUN 8.0 7.0 - 28.4 mg/dL    Creatinine 0.6 0.6 - 1.0 mg/dL    Calcium 9.0 8.5 - 13.2 mg/dL    Sodium 440 102 - 725 mEq/L    Potassium 3.9 3.5 - 5.1 mEq/L    Chloride 108 100 - 111 mEq/L    CO2 23 22 - 29 mEq/L    Anion Gap 10.0 5.0 - 15.0   Lipase   Result Value Ref Range    Lipase 9 8 - 78 U/L   Hepatic function panel (LFT)   Result Value Ref Range    Bilirubin, Total 0.6 0.2 - 1.2 mg/dL    Bilirubin, Direct 0.2 0.0 - 0.5 mg/dL    Bilirubin, Indirect 0.4 0.2 - 1.0 mg/dL    AST (SGOT) 24 5 - 34 U/L    ALT 14 0 - 55 U/L    Alkaline Phosphatase 86 37 - 106 U/L    Protein, Total 6.6 6.0 - 8.3 g/dL    Albumin 3.7 3.5 - 5.0 g/dL  Globulin 2.9 2.0 - 3.6 g/dL    Albumin/Globulin Ratio 1.3 0.9 - 2.2   Troponin I   Result Value Ref Range    Troponin I <0.01 0.00 - 0.09 ng/mL   Magnesium   Result Value Ref Range    Magnesium 2.0 1.6 - 2.6 mg/dL   UA with reflex to micro (pts 3 + yrs)   Result Value Ref Range    Urine Type Clean Catch     Color, UA Yellow Colorless - Yellow    Clarity, UA Clear Clear - Hazy    Specific Gravity UA 1.015 1.001 - 1.035    Urine pH 9.0 (A) 5.0 - 8.0    Leukocyte Esterase, UA NEGATIVE Negative    Nitrite, UA NEGATIVE Negative    Protein, UR NEGATIVE Negative    Glucose, UA NEGATIVE Negative    Ketones UA NEGATIVE Negative    Urobilinogen, UA <2.0 0.2 - 2.0 mg/dL    Bilirubin, UA NEGATIVE Negative    Blood, UA NEGATIVE Negative   GFR   Result Value Ref Range    EGFR >60.0    CBC  with Differential   Result Value Ref Range    WBC 3.60 3.10 - 9.50 x10 3/uL    Hgb 12.1 11.4 - 14.8 g/dL    Hematocrit 16.1 09.6 - 43.7 %    Platelets 228 142 - 346 x10 3/uL    RBC 4.28 3.90 - 5.10 x10 6/uL    MCV 87.6 78.0 - 96.0 fL    MCH 28.3 25.1 - 33.5 pg    MCHC 32.3 31.5 - 35.8 g/dL    RDW 14 11 - 15 %    MPV 10.2 8.9 - 12.5 fL    Neutrophils 57.3 None %    Lymphocytes Automated 28.6 None %    Monocytes 10.8 None %    Eosinophils Automated 2.5 None %    Basophils Automated 0.8 None %    Immature Granulocyte 0.0 None %    Nucleated RBC 0.0 0.0 - 0.0 /100 WBC    Neutrophils Absolute 2.06 1.10 - 6.33 x10 3/uL    Abs Lymph Automated 1.03 0.42 - 3.22 x10 3/uL    Abs Mono Automated 0.39 0.21 - 0.85 x10 3/uL    Abs Eos Automated 0.09 0.00 - 0.44 x10 3/uL    Absolute Baso Automated 0.03 0.00 - 0.08 x10 3/uL    Absolute Immature Granulocyte 0.00 0.00 - 0.07 x10 3/uL    Absolute NRBC 0.00 0.00 - 0.00 x10 3/uL   Troponin I   Result Value Ref Range    Troponin I <0.01 0.00 - 0.09 ng/mL   ECG 12 Lead   Result Value Ref Range    Ventricular Rate 57 BPM    Atrial Rate 57 BPM    P-R Interval 168 ms    QRS Duration 82 ms    Q-T Interval 418 ms    QTC Calculation (Bezet) 406 ms    P Axis 64 degrees    R Axis -12 degrees    T Axis 12 degrees   Repeat-ECG 12 Lead   Result Value Ref Range    Ventricular Rate 55 BPM    Atrial Rate 55 BPM    P-R Interval 164 ms    QRS Duration 88 ms    Q-T Interval 426 ms    QTC Calculation (Bezet) 407 ms    P Axis 37 degrees    R Axis -11 degrees  T Axis 1 degrees       CRITICAL CARE/PROCEDURES    Procedures      DIAGNOSIS      Diagnosis:  Final diagnoses:   Chest pain, unspecified type   Hypertension, unspecified type   Lightheadedness       Disposition:  ED Disposition     ED Disposition Condition Date/Time Comment    Discharge  Thu Jun 16, 2018 11:43 AM [name] discharge to home/self care.    Condition at disposition: Stable          Prescriptions:  Patient's Medications   New  Prescriptions    No medications on file   Previous Medications    ALBUTEROL (PROVENTIL HFA;VENTOLIN HFA) 108 (90 BASE) MCG/ACT INHALER    Inhale into the lungs every 4 (four) hours as needed.       ASPIRIN EC 81 MG EC TABLET    Take 1 tablet (81 mg total) by mouth daily.    ATENOLOL (TENORMIN) 100 MG TABLET    Take 1 tablet (100 mg total) by mouth daily    B COMPLEX VITAMINS (VITAMIN-B COMPLEX) TAB    Take  by Mouth every day    CHOLECALCIFEROL (VITAMIN D-1000 MAX ST) 1000 UNITS TABLET    Take 1,000 Units by Mouth Once a Day at lunch time    DIAZEPAM (VALIUM) 2 MG TABLET    Take 1 tablet (2 mg total) by mouth every 8 (eight) hours as needed for Anxiety.    FLUTICASONE (FLONASE) 50 MCG/ACT NASAL SPRAY    2 sprays by Nasal route daily.    MULTIPLE VITAMIN (MULTIVITAMIN) CAPSULE    Take 1 capsule by mouth daily    NYSTATIN (NYSTOP) POWDER    Apply to affected area 3 times daily    ONDANSETRON (ZOFRAN-ODT) 4 MG DISINTEGRATING TABLET    Take 1 tablet (4 mg total) by mouth every 6 (six) hours as needed for Nausea    PANTOPRAZOLE (PROTONIX) 40 MG TABLET    TAKE 1 TABLET(40 MG) BY MOUTH DAILY    VALACYCLOVIR HCL (VALTREX) 500 MG TABLET    TAKE 1 TABLET(500 MG) BY MOUTH DAILY    VITAMIN A 16109 UNIT CAPSULE    Take 10,000 Units by mouth daily    VITAMIN B-12 (CYANOCOBALAMIN) 100 MCG TABLET    Take 50 mcg by mouth every other day   Modified Medications    No medications on file   Discontinued Medications    No medications on file            Arther Abbott, DO  06/16/18 1153

## 2018-06-16 NOTE — ED Triage Notes (Signed)
intermittent chest pain and SOB. Dizzy, headache. Onset today at 0230. + nausea. No fever. No diarrhea.

## 2018-06-16 NOTE — EDIE (Signed)
COLLECTIVE?NOTIFICATION?06/16/2018 08:48?LESHAWN, HOUSEWORTH F?MRN: [id number]    Criteria Met      5 ED Visits in 12 Months    Security and Safety  No recent Security Events currently on file    ED Care Guidelines  There are currently no ED Care Guidelines for this patient. Please check your facility's medical records system.          E.D. Visit Count (12 mo.)  Facility Visits   [facility] 1   [place] [facility]: [facility] [place] 1   [place] - [facility] 1   [place] [facility] 1   [place] [facility]: [facility] 6   Total 10   Note: Visits indicate total known visits.      Recent Emergency Department Visit Summary  Date Facility [facility] Type Diagnoses or Chief Complaint   Jun 16, 2018 [place] [facility]: [facility] [place] [facility]      Chest Pain      Apr 07, 2018 [facility] [place] [name] [facility]      RS CHEST PAIN      Other chest pain      CHEST PAIN ADULT      Mar 08, 2018 [place] [facility] [place] [facility]      abdominal pain,dizzy, nausea      Abdominal Pain      Dizziness      Epigastric pain      Jan 24, 2018 [place] [facility]: [facility] [facility] [place] [facility]      Rapid Heart Rate      Chest Pain      Anesthesia of skin      Chest pain, unspecified      Palpitations      Nov 21, 2017 [place] [facility]: [facility] [employer] [employer] [place] [facility]      TRIAGE - Pain on Left Knee      Knee Pain      Pain in left knee      Nov 04, 2017 [place] [facility] [place] [facility]      Hypertention,dizziness      Chest Pain      Dizziness      Other fatigue      Bariatric surgery status      Essential (primary) hypertension      Postgastric surgery syndromes      Aug 17, 2017 [place] [facility]: [facility] [place] [facility]      chest pain      Dizziness and giddiness      Chest pain, unspecified      Elevated blood-pressure reading,  without diagnosis of hypertension      Jul 16, 2017 [place] [facility]: [facility] [place] [facility]      chest pain      Nonspecific low blood-pressure reading      Chest pain, unspecified      Palpitations      Jul 03, 2017 [place] [facility]: [facility] [place] [facility]      possible allergic reaction      Urticaria      Urticaria, unspecified      Jul 02, 2017  - [place] [facility]  Emergency      chest pain      Chest pain, unspecified          Recent Inpatient Visit  Summary  Date Facility [facility] Type Diagnoses or Chief Complaint   Feb 08, 2018 [facility] [place] [name] [facility]      DIZZINESS      1. Dizziness and giddiness      2. Transient cerebral ischemic attack, unspecified      4. Essential (primary) hypertension      5. Obstructive sleep apnea (adult) (pediatric)      6. Generalized anxiety disorder      7. Hyperlipidemia, unspecified      8. Gastro-esophageal reflux disease without esophagitis      9. Bilateral primary osteoarthritis of knee      10. Hypokalemia      Jan 24, 2018 [place] [facility]  Medical Surgical      Anesthesia of skin      Palpitations      Chest pain, unspecified      Headache      Paresthesia of skin      Other chest pain          Care Team  There are no care providers on record at this time.   Collective Portal  This patient has registered at the [facility] Emergency Room: [facility] Emergency Department   For more information visit: [email or web address]     PLEASE NOTE:    1.   Any care recommendations and other clinical information are provided as guidelines or for historical purposes only, and providers should exercise their own clinical judgment when providing care.    2.   You may only use this information for purposes of treatment, payment or health care operations activities, and subject to the limitations of applicable Collective  Policies.    3.   You should consult directly with the organization that provided a care guideline or other clinical history with any questions about additional information or accuracy or completeness of information provided.    ? 2019 [place] - [email or web address]

## 2018-06-17 ENCOUNTER — Telehealth (INDEPENDENT_AMBULATORY_CARE_PROVIDER_SITE_OTHER): Payer: Self-pay

## 2018-06-17 NOTE — Telephone Encounter (Signed)
ED Visit Template    Chart Review:   [facility]  Discharge Date:06/16/18  Primary Discharge Dx: chest pain/hupertension and Light headedness  Prior ER Visits/Hospitalizations (past year):11  Follow Up Appt with PCP: 06/20/18  Home Care/Home Health Ordered? N/A  Have you been contacted to arrange care? N/A    ? Patient Interview: "I heard you went to the ER for  Chest pain_______, what happened?"  Pt Stated,"My pressure was 198/111.  I had chest pain, abdominal pain headache and nausea.  My pressure is not as high as it was yesterday(ER) but it is still  high at that time(150/90's.  o What symptoms do you have now, if any?   Pt stated her BP is still elevated,with a headache and nausea  ? Tell me about  your current medications and any changes the hospital made:Pt stated no changes  Were made to medication.  o If new medication prescribed:   - Tell me about the new medication and how you are to take it? No changes. All medication changes was during ER visit.pt stated she received BP medication through IV  - What pharmacy did you use for your new medication(s)? N/A  - What difficulty did you have, if any, picking up your medication(s)?  o If the new medication is an injection:   - What training did you receive from the hospital staff?   - Tell me about any difficulty you are having administering the medication?  ? What appointments have you made for follow-up?  o How can I help you to schedule these appointments? Pt has a f/u appt with PCP on 06/20/18  ? Patient may need one of the following after an ED visit:  o Patient may need to schedule a follow up with PCP  o Patient may need to see a specialist, offer to assist patient in scheduling  o Patient may have used ED inappropriately (i.e. for a non-emergency medical problem), and this opportunity should be utilized to educate patient about the appropriate place for care.  o Link to [facility]  Locations:  - [email or web address]    "Thank you for taking my call today! If there is anything that you need, please give your office a call at ______."     *Instructed patient to call back if symptoms change or worsen and/or go to the emergency room or call 911 for emergency symptoms*

## 2018-06-20 ENCOUNTER — Encounter (INDEPENDENT_AMBULATORY_CARE_PROVIDER_SITE_OTHER): Payer: Self-pay | Admitting: Internal Medicine

## 2018-06-20 ENCOUNTER — Ambulatory Visit (INDEPENDENT_AMBULATORY_CARE_PROVIDER_SITE_OTHER): Payer: Medicare Other | Admitting: Internal Medicine

## 2018-06-20 VITALS — BP 171/95 | HR 72 | Temp 98.0°F | Ht 61.0 in | Wt 139.6 lb

## 2018-06-20 DIAGNOSIS — I10 Essential (primary) hypertension: Secondary | ICD-10-CM

## 2018-06-20 MED ORDER — LOSARTAN POTASSIUM 50 MG PO TABS
50.0000 mg | ORAL_TABLET | Freq: Every day | ORAL | 5 refills | Status: DC
Start: 2018-06-20 — End: 2018-07-07

## 2018-06-20 NOTE — Progress Notes (Signed)
Subjective:       Patient ID: Amber [name] is a 67 y.o. female.    HPI  Pt is here for follow up    HTN, home BP running high, had some headache, went to ED, CT head, labs neg, given hydralazine in ED,  Has hx of intolerance to multiple BP meds including amlodipine, lisinopril, diuretics, carvedilol   Pt is reluctant to take hydralazine due to tid dosing.       The following portions of the patient's history were reviewed and updated as appropriate: allergies, current medications, past family history, past medical history, past social history, past surgical history and problem list.    Review of Systems   Constitutional: Negative for appetite change, fever and unexpected weight change.   Respiratory: Negative for shortness of breath.    Cardiovascular: Negative for chest pain and palpitations.   Gastrointestinal: Negative for abdominal pain, nausea and vomiting.   Genitourinary: Negative for dysuria.   Neurological: Negative for syncope and light-headedness.     BP (!) 171/95   Pulse 72   Temp 98 F (36.7 C) (Oral)   Ht 1.549 m (5\' 1" )   Wt 63.3 kg (139 lb 9.6 oz)   BMI 26.38 kg/m        Objective:    Physical Exam   Constitutional: She appears well-developed. No distress.   HENT:   Mouth/Throat: Oropharynx is clear and moist. No oropharyngeal exudate.   Eyes: Conjunctivae are normal. No scleral icterus.   Neck: Neck supple. No JVD present. Carotid bruit is not present. No thyromegaly present.   Cardiovascular: Normal rate, regular rhythm, normal heart sounds and intact distal pulses.  Exam reveals no gallop and no friction rub.    No murmur heard.  Pulmonary/Chest: Effort normal and breath sounds normal. No respiratory distress. She has no rales.   Abdominal: Soft. Bowel sounds are normal. She exhibits no distension and no mass. There is no tenderness. There is no rebound and no guarding.   Musculoskeletal: She exhibits no edema.   Neurological: She is alert.           Assessment:       1. Essential  hypertension  losartan (COZAAR) 50 MG tablet          Plan:      Procedures  No orders of the defined types were placed in this encounter.      rx losartan 50mg  daily  Continue other meds  Monitor BP  F/u 2 wks.

## 2018-06-20 NOTE — Progress Notes (Signed)
Have you seen any specialists/other providers since your last visit with [place]?    No    Arm preference verified?   Yes    The patient is due for mammogram, shingles vaccine, pneumonia vaccine and hep c screening, and medicare annual wellness visit.

## 2018-06-21 ENCOUNTER — Telehealth (INDEPENDENT_AMBULATORY_CARE_PROVIDER_SITE_OTHER): Payer: Self-pay | Admitting: Internal Medicine

## 2018-06-21 NOTE — Telephone Encounter (Signed)
Patient called check status on her apt was given for 07/08/2018 for her follow up no apt found please advise and let her know # [phone]

## 2018-06-22 ENCOUNTER — Encounter (INDEPENDENT_AMBULATORY_CARE_PROVIDER_SITE_OTHER): Payer: Self-pay | Admitting: Internal Medicine

## 2018-06-22 LAB — ECG 12-LEAD
Atrial Rate: 57 {beats}/min
Atrial Rate: 55 {beats}/min
P Axis: 64 degrees
P Axis: 37 degrees
P-R Interval: 168 ms
P-R Interval: 164 ms
Q-T Interval: 418 ms
Q-T Interval: 426 ms
QRS Duration: 82 ms
QRS Duration: 88 ms
QTC Calculation (Bezet): 406 ms
QTC Calculation (Bezet): 407 ms
R Axis: -12 degrees
R Axis: -11 degrees
T Axis: 12 degrees
T Axis: 1 degrees
Ventricular Rate: 57 {beats}/min
Ventricular Rate: 55 {beats}/min

## 2018-06-22 NOTE — Telephone Encounter (Signed)
Please schedule pt

## 2018-06-23 ENCOUNTER — Ambulatory Visit (INDEPENDENT_AMBULATORY_CARE_PROVIDER_SITE_OTHER): Payer: Medicare Other | Admitting: Neurology

## 2018-06-23 ENCOUNTER — Other Ambulatory Visit (INDEPENDENT_AMBULATORY_CARE_PROVIDER_SITE_OTHER): Payer: Self-pay | Admitting: Internal Medicine

## 2018-06-23 MED ORDER — HYDRALAZINE HCL 25 MG PO TABS
25.0000 mg | ORAL_TABLET | Freq: Two times a day (BID) | ORAL | 5 refills | Status: DC
Start: 2018-06-23 — End: 2018-07-11

## 2018-07-07 ENCOUNTER — Ambulatory Visit (INDEPENDENT_AMBULATORY_CARE_PROVIDER_SITE_OTHER): Payer: Medicare Other | Admitting: Cardiology

## 2018-07-07 ENCOUNTER — Encounter (INDEPENDENT_AMBULATORY_CARE_PROVIDER_SITE_OTHER): Payer: Self-pay | Admitting: Cardiology

## 2018-07-07 VITALS — BP 152/84 | HR 75 | Ht 61.81 in | Wt 137.0 lb

## 2018-07-07 DIAGNOSIS — I10 Essential (primary) hypertension: Secondary | ICD-10-CM

## 2018-07-07 MED ORDER — LABETALOL HCL 200 MG PO TABS
200.0000 mg | ORAL_TABLET | Freq: Two times a day (BID) | ORAL | 3 refills | Status: DC
Start: 2018-07-07 — End: 2018-07-11

## 2018-07-07 NOTE — Progress Notes (Signed)
[name] [phone].o. with a history of HTN presents for cardiac follow up      HTN   - blood pressure not controlled on atenolol 100 mg daily   - hydralazine 25 mg bid recently added - complained of side effects   - multiple side effects to multiple BP medications    Cardiac work up has included  - stress test (11/06/17 - normal myocardial perfusion)  - cardiac cath ( 01/08/14 - normal )  - echocardiogram ( EF 55%, mild TR)  - 24 hour holter - no significant events  CVD              - hx of TIA - occurred when not on ASA              - no recurrence    HTN              - elevated at home               - currently on atenolol 75 mg daily      Palpitations  - daily  - no syncope  - no significant events on 30 day event monitor    Chest pain  - atypical  - normal cardiac cath      Past Medical History:   Diagnosis Date   . Asthma    . Chest pain 2016     Chest pain after eating x6  months--being followed by GI for GERD   . Constipation    . Difficulty walking     amb with cane secondary to arthritis bil knees   . Genital herpes     no current outbreak (10/03/15)   . GERD (gastroesophageal reflux disease)    . Hiatal hernia     h/o surgery   . Hyperlipidemia    . Hypertensive disorder     Well controlled on med. Denies any SOB in last 6 months (10/03/15)   . Low back pain    . Morbid obesity with BMI of 40.0-44.9, adult     BMI 40.9   . Nausea without vomiting    . OSA on CPAP 2015    CPAP nightly x 1 yr   . Osteoarthritis of knees, bilateral     and back   . TIA (transient ischemic attack) 2014    TIA 2014-no residual. Patient evaulated for possible TIA 07/12/2015  @ [facility] (dischage summary in epic)-per summary CT of head was normal and patient was d/c'd     Family History   Problem Relation Age of Onset   . Cancer Mother 10        breast cancer   . Breast cancer Mother    . Heart disease Father    .  Malignant hyperthermia Neg Hx    . Pseudochol deficiency Neg Hx      Current Outpatient Prescriptions   Medication Sig Dispense Refill   . albuterol (PROVENTIL HFA;VENTOLIN HFA) 108 (90 BASE) MCG/ACT inhaler Inhale into the lungs every 4 (four) hours as needed.        Marland Kitchen aspirin EC 81 MG EC tablet Take 1 tablet (81 mg total) by mouth daily. 30 tablet 1   . B Complex Vitamins (VITAMIN-B COMPLEX) Tab Take  by Mouth every day     . cholecalciferol (VITAMIN D-1000 MAX ST) 1000 units tablet Take 1,000 Units by Mouth Once a Day at lunch time     . diazePAM (VALIUM) 2 MG  tablet Take 1 tablet (2 mg total) by mouth every 8 (eight) hours as needed for Anxiety. 30 tablet 2   . fluticasone (FLONASE) 50 MCG/ACT nasal spray 2 sprays by Nasal route daily. (Patient taking differently: 2 sprays by Nasal route as needed.   ) 16 g 2   . hydrALAZINE (APRESOLINE) 25 MG tablet Take 1 tablet (25 mg total) by mouth 2 (two) times daily 60 tablet 5   . Multiple Vitamin (MULTIVITAMIN) capsule Take 1 capsule by mouth daily     . nystatin (NYSTOP) powder Apply to affected area 3 times daily 60 g 1   . pantoprazole (PROTONIX) 40 MG tablet TAKE 1 TABLET(40 MG) BY MOUTH DAILY 90 tablet 0   . valACYclovir HCL (VALTREX) 500 MG tablet TAKE 1 TABLET(500 MG) BY MOUTH DAILY (Patient taking differently: TAKE 1 TABLET(500 MG) BY MOUTH DAILY PRN) 90 tablet 0   . vitamin B-12 (CYANOCOBALAMIN) 100 MCG tablet Take 50 mcg by mouth every other day     . labetalol (NORMODYNE) 200 MG tablet Take 1 tablet (200 mg total) by mouth 2 (two) times daily 180 tablet 3     No current facility-administered medications for this visit.           PE:    Vitals:    07/07/18 0814   BP: 152/84   Pulse: 75     Body mass index is 25.21 kg/m.    General:nad  cv rr  Lung cta  abd s  Ext no le edema    EKG:    nsr    Labs:  Lipid Panel   Cholesterol   Date/Time Value Ref Range Status   01/25/2018 05:06 AM 151 0 - 199 mg/dL Final     Triglycerides   Date/Time Value Ref Range Status    01/25/2018 05:06 AM 26 (L) 34 - 149 mg/dL Final     HDL   Date/Time Value Ref Range Status   01/25/2018 05:06 AM 53 40 - 9,999 mg/dL Final     Comment:     An HDL cholesterol <40 mg/dL is low and constitutes a  coronary heart disease risk factor, and HDL-C>59 mg/dL is  a negative risk factor for CHD.  Ref: American Heart Association; Circulation 2004         CMP:   Sodium   Date/Time Value Ref Range Status   06/16/2018 09:24 AM 141 136 - 145 mEq/L Final     Potassium   Date/Time Value Ref Range Status   06/16/2018 09:24 AM 3.9 3.5 - 5.1 mEq/L Final     Chloride   Date/Time Value Ref Range Status   06/16/2018 09:24 AM 108 100 - 111 mEq/L Final   11/10/2017 07:40 AM 107 98 - 110 mmol/L Final     CO2   Date/Time Value Ref Range Status   06/16/2018 09:24 AM 23 22 - 29 mEq/L Final     Glucose   Date/Time Value Ref Range Status   06/16/2018 09:24 AM 95 70 - 100 mg/dL Final     Comment:     ADA guidelines for diabetes mellitus:  Fasting:  Equal to or greater than 126 mg/dL  Random:   Equal to or greater than 200 mg/dL       BUN   Date/Time Value Ref Range Status   06/16/2018 09:24 AM 8.0 7.0 - 19.0 mg/dL Final     Protein, Total   Date/Time Value Ref Range Status   06/16/2018 09:24 AM  6.6 6.0 - 8.3 g/dL Final   62/95/2841 32:44 AM 6.4 6.1 - 8.1 g/dL Final     Alkaline Phosphatase   Date/Time Value Ref Range Status   06/16/2018 09:24 AM 86 37 - 106 U/L Final     AST (SGOT)   Date/Time Value Ref Range Status   06/16/2018 09:24 AM 24 5 - 34 U/L Final     ALT   Date/Time Value Ref Range Status   06/16/2018 09:24 AM 14 0 - 55 U/L Final     Anion Gap   Date/Time Value Ref Range Status   06/16/2018 09:24 AM 10.0 5.0 - 15.0 Final       CBC:   WBC   Date/Time Value Ref Range Status   06/16/2018 09:43 AM 3.60 3.10 - 9.50 x10 3/uL Final   06/30/2009 04:03 PM 9.12 3.50 - 10.80 /CUMM Final     RBC   Date/Time Value Ref Range Status   06/16/2018 09:43 AM 4.28 3.90 - 5.10 x10 6/uL Final     Hemoglobin   Date/Time Value Ref Range Status    11/10/2017 07:40 AM 12.5 11.7 - 15.5 g/dL Final     Hgb   Date/Time Value Ref Range Status   06/16/2018 09:43 AM 12.1 11.4 - 14.8 g/dL Final     Hematocrit   Date/Time Value Ref Range Status   06/16/2018 09:43 AM 37.5 34.7 - 43.7 % Final     MCV   Date/Time Value Ref Range Status   06/16/2018 09:43 AM 87.6 78.0 - 96.0 fL Final     MCHC   Date/Time Value Ref Range Status   06/16/2018 09:43 AM 32.3 31.5 - 35.8 g/dL Final     RDW   Date/Time Value Ref Range Status   06/16/2018 09:43 AM 14 11 - 15 % Final     Platelets   Date/Time Value Ref Range Status   06/16/2018 09:43 AM 228 142 - 346 x10 3/uL Final   11/10/2017 07:40 AM 266 140 - 400 Thousand/uL Final           Impression / plan    HTN   - uncontrolled on current med rx   - will stop atenolol and start labetolol 200 mg bid - will titrate as needed   - stop hydralazine   - low Na diet   - check home bp   Chest pain   - noncardiac   - normal cardiac cath      RTC 4 weeks        Lavra Imler Dan Humphreys  07/07/2018

## 2018-07-08 ENCOUNTER — Encounter (INDEPENDENT_AMBULATORY_CARE_PROVIDER_SITE_OTHER): Payer: Self-pay

## 2018-07-08 ENCOUNTER — Ambulatory Visit (INDEPENDENT_AMBULATORY_CARE_PROVIDER_SITE_OTHER): Payer: Medicare Other | Admitting: Internal Medicine

## 2018-07-08 NOTE — Progress Notes (Signed)
Patient called and stated that she had gone to [facility] E.[facility] Yesterday after having seen Dr. Katrinka Blazing in the morning. Patient was at school and c/o chest pain and EMS was called. Cardiac workup was negative and patient had not started Labetalol as ordered by Dr. Katrinka Blazing. Patient stated she was picking up the medication today. Patient instructed to monitor her BP over the weekend and to call results to this nurse on Monday morning.

## 2018-07-11 ENCOUNTER — Encounter (INDEPENDENT_AMBULATORY_CARE_PROVIDER_SITE_OTHER): Payer: Self-pay

## 2018-07-11 ENCOUNTER — Other Ambulatory Visit (INDEPENDENT_AMBULATORY_CARE_PROVIDER_SITE_OTHER): Payer: Self-pay | Admitting: Cardiology

## 2018-07-11 DIAGNOSIS — I10 Essential (primary) hypertension: Secondary | ICD-10-CM

## 2018-07-11 MED ORDER — SPIRONOLACTONE 25 MG PO TABS
25.0000 mg | ORAL_TABLET | Freq: Every day | ORAL | 3 refills | Status: DC
Start: 2018-07-11 — End: 2018-09-02

## 2018-07-11 MED ORDER — AMLODIPINE BESYLATE 5 MG PO TABS
5.0000 mg | ORAL_TABLET | Freq: Every day | ORAL | 3 refills | Status: DC
Start: 2018-07-11 — End: 2018-08-09

## 2018-07-19 ENCOUNTER — Ambulatory Visit (INDEPENDENT_AMBULATORY_CARE_PROVIDER_SITE_OTHER): Payer: Medicare Other | Admitting: Internal Medicine

## 2018-07-26 ENCOUNTER — Emergency Department
Admission: EM | Admit: 2018-07-26 | Discharge: 2018-07-26 | Disposition: A | Discharge status: Home / Self Care | Payer: Medicare Other | Attending: Emergency Medicine | Admitting: Emergency Medicine

## 2018-07-26 ENCOUNTER — Emergency Department: Payer: Medicare Other

## 2018-07-26 DIAGNOSIS — G4733 Obstructive sleep apnea (adult) (pediatric): Secondary | ICD-10-CM | POA: Insufficient documentation

## 2018-07-26 DIAGNOSIS — J101 Influenza due to other identified influenza virus with other respiratory manifestations: Secondary | ICD-10-CM | POA: Insufficient documentation

## 2018-07-26 DIAGNOSIS — I10 Essential (primary) hypertension: Secondary | ICD-10-CM | POA: Insufficient documentation

## 2018-07-26 DIAGNOSIS — Z8673 Personal history of transient ischemic attack (TIA), and cerebral infarction without residual deficits: Secondary | ICD-10-CM | POA: Insufficient documentation

## 2018-07-26 DIAGNOSIS — Z6841 Body Mass Index (BMI) 40.0 and over, adult: Secondary | ICD-10-CM | POA: Insufficient documentation

## 2018-07-26 DIAGNOSIS — Z7951 Long term (current) use of inhaled steroids: Secondary | ICD-10-CM | POA: Insufficient documentation

## 2018-07-26 DIAGNOSIS — R262 Difficulty in walking, not elsewhere classified: Secondary | ICD-10-CM | POA: Insufficient documentation

## 2018-07-26 DIAGNOSIS — J45909 Unspecified asthma, uncomplicated: Secondary | ICD-10-CM | POA: Insufficient documentation

## 2018-07-26 DIAGNOSIS — K219 Gastro-esophageal reflux disease without esophagitis: Secondary | ICD-10-CM | POA: Insufficient documentation

## 2018-07-26 DIAGNOSIS — Z7982 Long term (current) use of aspirin: Secondary | ICD-10-CM | POA: Insufficient documentation

## 2018-07-26 DIAGNOSIS — Z79899 Other long term (current) drug therapy: Secondary | ICD-10-CM | POA: Insufficient documentation

## 2018-07-26 DIAGNOSIS — E785 Hyperlipidemia, unspecified: Secondary | ICD-10-CM | POA: Insufficient documentation

## 2018-07-26 LAB — GROUP A STREP, RAPID ANTIGEN: Group A Strep, Rapid Antigen: NEGATIVE

## 2018-07-26 MED ORDER — IPRATROPIUM BROMIDE 0.02 % IN SOLN
0.50 mg | Freq: Once | RESPIRATORY_TRACT | Status: AC
Start: 2018-07-26 — End: 2018-07-26
  Administered 2018-07-26: 21:00:00 0.5 mg via RESPIRATORY_TRACT
  Filled 2018-07-26: qty 2.5

## 2018-07-26 MED ORDER — OSELTAMIVIR PHOSPHATE 6 MG/ML PO SUSR
75.00 mg | Freq: Once | ORAL | Status: AC
Start: 2018-07-26 — End: 2018-07-26
  Administered 2018-07-26: 22:00:00 75 mg via ORAL
  Filled 2018-07-26: qty 4500

## 2018-07-26 MED ORDER — DEXAMETHASONE SODIUM PHOSPHATE 4 MG/ML IJ SOLN (WRAP)
10.00 mg | Freq: Once | INTRAMUSCULAR | Status: AC
Start: 2018-07-26 — End: 2018-07-26
  Administered 2018-07-26: 21:00:00 10 mg via ORAL
  Filled 2018-07-26: qty 5

## 2018-07-26 MED ORDER — ALBUTEROL SULFATE (2.5 MG/3ML) 0.083% IN NEBU
2.50 mg | INHALATION_SOLUTION | Freq: Once | RESPIRATORY_TRACT | Status: AC
Start: 2018-07-26 — End: 2018-07-26
  Administered 2018-07-26: 21:00:00 2.5 mg via RESPIRATORY_TRACT
  Filled 2018-07-26: qty 3

## 2018-07-26 MED ORDER — OSELTAMIVIR PHOSPHATE 75 MG PO CAPS
75.00 mg | ORAL_CAPSULE | Freq: Two times a day (BID) | ORAL | 0 refills | Status: AC
Start: 2018-07-26 — End: 2018-07-31

## 2018-07-26 MED ORDER — ACETAMINOPHEN 500 MG PO TABS
1000.0000 mg | ORAL_TABLET | Freq: Once | ORAL | Status: AC
Start: 2018-07-26 — End: 2018-07-26
  Administered 2018-07-26: 21:00:00 1000 mg via ORAL
  Filled 2018-07-26: qty 2

## 2018-07-26 NOTE — ED Triage Notes (Signed)
Pt arrived ambulatory with c/o sore throat and right ear pain.  Onset a couple of days.  Pt stated "The ear pain is constant and the sore throat comes and goes."  Pt rates pain 9/10.  Pt denies taking any pain medication prior to arrival.  Pt has a dry cough.

## 2018-07-26 NOTE — Discharge Instructions (Signed)
Influenza, Seasonal     You have been seen for influenza. This is sometimes called “the flu.” Often, no tests are done but influenza is strongly suspected. For this reason, many doctors are giving the diagnosis of “Influenza-Like Illness.”     Influenza is very contagious. This means it spreads easily among people. It is caused by the influenza virus. That is why it is also called "the flu." It affects the respiratory tract. This includes the nose, throat and lungs. In some people, it can cause very serious problems. This usually happens with the very young and the elderly. The Centers for Disease Control and Prevention (CDC) reports that an average of 5-20% of people living in the [place] are affected by regular seasonal influenza every year. This includes influenza A and B. More than 200,000 people are hospitalized from flu complications. About 36,000 people die from flu-related causes every year.     STAY AWAY FROM OTHER PEOPLE WHO ARE NOT YET AFFECTED. THIS IS THE BEST WAY TO STOP THE SPREAD OF INFLUENZA. YOU CAN SPREAD THE FLU FROM ONE DAY BEFORE YOU GET SICK UP TO 7 DAYS AFTER YOU DEVELOP SYMPTOMS.     UNTIL IT IS CONFIRMED THAT YOU DON’T HAVE INFLUENZA, YOU SHOULD:  · Stay off from work. You should be off of work for 7 days from the start of symptoms or until the viral cultures show you don’t have influenza.  · Stay home and do not go into public places.  · Stay away from others in the house.  · Wash your hands often.  · Cover your mouth when you sneeze or cough. Use your sleeve to cover your mouth and nose. Do not use your hand.     People you live with and those you had close contact with, should watch for flu signs. People with a serious medical condition or who are pregnant should tell their doctor they were exposed to flu.     Influenza often happens as an epidemic in the fall and winter. This means many people get the flu virus at the same time. It spreads from person to person. Over the centuries, there  have been epidemics of many different “new” strains of influenza.     Most people with influenza get these symptoms:  · High fevers: Fevers are often higher than 102°F or 39°C.  · Cough. A dry, hacking cough is most common.  · Headache: The headache may be worse with the fever. It can be there even when the temperature is normal.  · Muscle aches. Many people have muscle pain. These are called “myalgias.”  · Sore throat.     You may also feel weak and fatigued (tired). You may also get a runny nose or congestion (stuffy nose). Some people will have mild stomach symptoms. This can include vomiting (throwing up). Sometimes it includes mild diarrhea.     Influenza does not primarily involve vomiting and diarrhea. Vomiting and diarrhea are usually symptoms of a completely different problem. This problem is called "gastroenteritis." Gastroenteritis is often called "the stomach flu." This is how the confusion came about! True influenza is a lung infection.     During flu season, influenza can sometimes be diagnosed without tests. This can be done if a patient has the right set of symptoms. These are called "classic flu" symptoms. Sometimes flu testing is done. This is usually with a test called a “Rapid Influenza Test.” Results show up in a few minutes. The test is not perfect. It misses many cases. Sometimes, more specialized tests   are done. These tests pick up more of the cases. However, results are not ready for a few days.     Usually, the "flu shot" is good at preventing influenza. Some years the shot is less effective than others. You can sometimes still get severe influenza, even if you had the immunization (shot). You can still get "the stomach flu," (not true influenza), even after the flu shot. Today, the current Influenza vaccine is designed to protect against seasonal Influenza A and B and H1N1. There are good supplies of the vaccine available. It is being given to all persons who would like to receive  it.     Influenza treatment depends on how early in the course of the illness you are when diagnosed. With medicine, you may feel better sooner. Medicines do not cure the flu. However, they do help your body fight the infection.     Some antiviral medicines may help symptoms. They often only help if they are started in the first few days of the illness. The Centers for Disease Control and Prevention (CDC) makes recommendations as to who should get the antiviral medicine. Your doctor will decide if the medicine may be helpful for you.     Most patients get better with simple things like rest, fluids and a little time. Acetaminophen (Tylenol®) and/or ibuprofen (Advil® or Motrin®) can also help. Older patients or those with serious medical problems may have worse problems with the flu. They may need additional care.     Treatment of influenza usually is at home. Some patients need to be in the hospital. These are often very young and very old patients and those with severe symptoms. It also includes those with other serious medical problems.     It is OK for you to go home now. Follow the instructions above about staying away from others!     YOU SHOULD SEEK MEDICAL ATTENTION IMMEDIATELY, EITHER HERE OR AT THE NEAREST EMERGENCY DEPARTMENT, IF ANY OF THE FOLLOWING OCCURS:  · You have shortness of breath, wheezing or any severe problems breathing.   · You have a headache that gets worse or a stiff neck.  · You feel lightheaded or faint.  · You have chest pain.  · You have a cough and fever (temperature higher than 100.4ºF or 38ºC) that seem to come back AFTER your flu symptoms get better. This can be a different kind of lung infection called “pneumonia,” that sometimes happens after the flu.  · You feel sicker at any time or do not get better as expected. You can expect to feel sick for a week or so.  · You have any other new symptoms or concerns.     If you can t follow up with your doctor, or if at any time you feel  you need to be rechecked or seen again, come back here or go to the nearest emergency department.

## 2018-07-26 NOTE — ED Notes (Signed)
Pt given a warm blanket per her request

## 2018-07-26 NOTE — EDIE (Signed)
COLLECTIVE?NOTIFICATION?07/26/2018 19:39?Amber [name], Amber [name]?MRN: [id number]    Criteria Met      5 ED Visits in 12 Months    Security and Safety  No recent Security Events currently on file    ED Care Guidelines  There are currently no ED Care Guidelines for this patient. Please check your facility's medical records system.          E.D. Visit Count (12 mo.)  Facility Visits   [facility] 2   [place] [facility]: [facility] [place] 1   [place] [facility] 1   [place] [facility]: [facility] 5   Total 9   Note: Visits indicate total known visits.      Recent Emergency Department Visit Summary  Date Facility [facility] Type Diagnoses or Chief Complaint   Jul 26, 2018 [facility]: [facility] [place] [facility]      Pain with swallowing      Jul 07, 2018 [facility] [place] [name] [facility]      RS ANGINA      CHEST PAIN ADULT      Chest pain, unspecified      Jun 16, 2018 [place] [facility]: [facility]. [place] [facility]      Chest Pain      Chest pain, unspecified      Dizziness and giddiness      Essential (primary) hypertension      Apr 07, 2018 [facility] [place] [name] [facility]      RS CHEST PAIN      Other chest pain      CHEST PAIN ADULT      Mar 08, 2018 [place] [facility] [place] [facility]      abdominal pain,dizzy, nausea      Abdominal Pain      Dizziness      Epigastric pain      Jan 24, 2018 [place] [facility]: [facility]. [place] [facility]      Rapid Heart Rate      Chest Pain      Anesthesia of skin      Chest pain, unspecified      Palpitations      Nov 21, 2017 [place] [facility]: [facility] [place] [facility]      TRIAGE - Pain on Left Knee      Knee Pain      Pain in left knee      Nov 04, 2017 [place] [facility]: [facility]. [place] [facility]      Hypertention,dizziness      Chest Pain      Dizziness      Other  fatigue      Bariatric surgery status      Essential (primary) hypertension      Postgastric surgery syndromes      Aug 17, 2017 [place] [facility]: [facility]. [place] [facility]      chest pain      Dizziness and giddiness      Chest pain, unspecified      Elevated blood-pressure reading, without diagnosis of hypertension          Recent Inpatient Visit Summary  Date Facility [facility] Type Diagnoses or Chief Complaint   Feb 08, 2018 [facility] [place] [name] [facility]      DIZZINESS  1. Dizziness and giddiness      2. Transient cerebral ischemic attack, unspecified      4. Essential (primary) hypertension      5. Obstructive sleep apnea (adult) (pediatric)      6. Generalized anxiety disorder      7. Hyperlipidemia, unspecified      8. Gastro-esophageal reflux disease without esophagitis      9. Bilateral primary osteoarthritis of knee      10. Hypokalemia      Jan 24, 2018 [place] [facility]      Anesthesia of skin      Palpitations      Chest pain, unspecified      Headache      Paresthesia of skin      Other chest pain          Care Team  There is not a care team on record at this time.   Collective Portal  This patient has registered at the [facility] Emergency Room: [facility] Emergency Department   For more information visit: [email or web address]     PLEASE NOTE:    1.   Any care recommendations and other clinical information are provided as guidelines or for historical purposes only, and providers should exercise their own clinical judgment when providing care.    2.   You may only use this information for purposes of treatment, payment or health care operations activities, and subject to the limitations of applicable Collective Policies.    3.   You should consult directly with the organization that provided a care guideline or other clinical history with any questions about  additional information or accuracy or completeness of information provided.    ? 2019 [place] - [email or web address]

## 2018-07-26 NOTE — ED Notes (Signed)
Pt to and from xray dept.

## 2018-07-26 NOTE — ED Notes (Signed)
Rounding, warm blanket given.

## 2018-07-28 ENCOUNTER — Ambulatory Visit (INDEPENDENT_AMBULATORY_CARE_PROVIDER_SITE_OTHER): Payer: Medicare Other | Admitting: Cardiology

## 2018-07-29 NOTE — ED Provider Notes (Signed)
[facility] Amber [name]) EMERGENCY DEPARTMENT H&P       Visit date: 07/26/2018      CLINICAL SUMMARY          Diagnosis:    .     Final diagnoses:   Influenza A         MDM Notes:      Patient symptoms consistent with influenza.  Advise follow-up with primary care physician and to take Tamiflu.  Stable vitals well-appearing.  No acute distress.  No evidence of sepsis.    Based on the patient's clinical presentation, vital signs, and diagnostic studies, the patient is safe for discharge. The work up has shown no signs of life-threatening emergency. The patient understands that follow up is needed, and if unable to do this - the patient should return to the ER for a repeat evaluation. Pt understands not all diagnosis can be excluded or found in the ED visit. Pt understands to return if have any worsening symptoms.    The patient/family understands their instructions and the patient is well-appearing at time of discharge. I explained results and discharge instructions to the patient/family. The patient/family acknowledges understanding and agrees with plan.               Disposition:      Disposition:  ED Disposition     ED Disposition Condition Date/Time Comment    Discharge  Tue Jul 26, 2018 10:11 PM [name] discharge to home/self care.    Condition at disposition: Stable          Prescriptions:  Discharge Medication List as of 07/26/2018 10:11 PM      START taking these medications    Details   oseltamivir (TAMIFLU) 75 MG capsule Take 1 capsule (75 mg total) by mouth 2 (two) times daily for 5 days, Starting Tue 07/26/2018, Until Sun 07/31/2018, Normal         CONTINUE these medications which have NOT CHANGED    Details   albuterol (PROVENTIL HFA;VENTOLIN HFA) 108 (90 BASE) MCG/ACT inhaler Inhale into the lungs every 4 (four) hours as needed.   , Starting 01/26/2012, Until Discontinued, Historical Med      B Complex Vitamins (VITAMIN-B COMPLEX) Tab Take  by Mouth every day,  Historical Med      cholecalciferol (VITAMIN D-1000 MAX ST) 1000 units tablet Take 1,000 Units by Mouth Once a Day at lunch time, Historical Med      diazePAM (VALIUM) 2 MG tablet Take 1 tablet (2 mg total) by mouth every 8 (eight) hours as needed for Anxiety., Starting Tue 07/13/2017, Print      fluticasone (FLONASE) 50 MCG/ACT nasal spray 2 sprays by Nasal route daily., Starting 01/02/2014, Until Discontinued, Normal      valACYclovir HCL (VALTREX) 500 MG tablet TAKE 1 TABLET(500 MG) BY MOUTH DAILY, Normal      amLODIPine (NORVASC) 5 MG tablet Take 1 tablet (5 mg total) by mouth daily, Starting Mon 07/11/2018, Normal      aspirin EC 81 MG EC tablet Take 1 tablet (81 mg total) by mouth daily., Starting Tue 01/25/2018, Normal      Multiple Vitamin (MULTIVITAMIN) capsule Take 1 capsule by mouth daily, Historical Med      nystatin (NYSTOP) powder Apply to affected area 3 times daily, Normal      pantoprazole (PROTONIX) 40 MG tablet TAKE 1 TABLET(40 MG) BY MOUTH DAILY, Normal      spironolactone (ALDACTONE) 25 MG tablet Take 1  tablet (25 mg total) by mouth daily, Starting Mon 07/11/2018, Until Tue 07/11/2019, Normal      vitamin B-12 (CYANOCOBALAMIN) 100 MCG tablet Take 50 mcg by mouth every other day, Historical Med                         CLINICAL INFORMATION        HPI:      Chief Complaint: Sore Throat; Otalgia; and Cough  .    Amber [name] is a 67 y.o. female who presents with throat pain since Thursday and hoarseness in her voice since Friday.  Coughing also since Thursday.  Patient reports right ear pain.  No fever chills nausea vomiting.  Patient has mild headache earlier in the week that now resolved.  Mild nasal congestion no neck pain.  No light sensitivity.      1. What was patient doing when symptoms started (Context): see above  2. Severity: moderate  3. Activities that worsen symptoms: none  4. Activities that improve symptoms: none  5. Quality: N/A  6. Radiation of symptoms: N/A  7. Associated signs and  Symptoms: see above  8. Are symptoms worsening? yes    History obtained from: Patient          ROS:      Positive and negative ROS elements as per HPI.  All other systems reviewed and negative.      Physical Exam:      Pulse 77  BP 145/81  Resp 20  SpO2 99 %  Temp 98.3 F (36.8 C)    Constitutional: Vital signs reviewed. Well appearing.  Head: Normocephalic, atraumatic  Eyes: No conjunctival injection. No discharge. PERRL, EOMI  ENT: Mucous membranes moist, clear oropharynx, clear TMs bilaterally no tonsils present no exudate midline uvula  Neck: Normal range of motion. Trachea midline.  No palpable lymph nodes  Respiratory/Chest: clear to auscultation. No wheezes, rales or rhonchi. Good air movement.  No dyspnea or work of breathing.  Cardiovascular: Regular rate and rhythm. No murmur. No rubs, murmurs, gallops  Abdomen: soft and nontender in all quadrants. No guarding or rebound. No masses or hepatosplenomegaly..  Back: no CVA tenderness  Upper Extremities:No edema. No cyanosis. No deformity.  Lower Extremities: No edema. No cyanosis. No deformity.  Neurological: Alert Normal and symmetric motor tone by observation. Speech normal. Memory Normal. No facial assymetry.  Skin: Warm and dry. No rash.  Psychiatric: Normal affect. Normal concentration.            PAST HISTORY        Primary Care Provider: Oneita Jolly, MD        PMH/PSH:    .     Past Medical History:   Diagnosis Date   . Asthma    . Chest pain 2016     Chest pain after eating x6  months--being followed by GI for GERD   . Constipation    . Difficulty walking     amb with cane secondary to arthritis bil knees   . Genital herpes     no current outbreak (10/03/15)   . GERD (gastroesophageal reflux disease)    . Hiatal hernia     h/o surgery   . Hyperlipidemia    . Hypertensive disorder     Well controlled on med. Denies any SOB in last 6 months (10/03/15)   . Low back pain    . Morbid obesity with BMI of 40.0-44.9, adult  BMI 40.9   . Nausea without  vomiting    . OSA on CPAP 2015    CPAP nightly x 1 yr   . Osteoarthritis of knees, bilateral     and back   . TIA (transient ischemic attack) 2014    TIA 2014-no residual. Patient evaulated for possible TIA 07/12/2015  @ [facility] (dischage summary in epic)-per summary CT of head was normal and patient was d/c'd       She has a past surgical history that includes [place] surgery (Right, >25 yrs); fallopian tube surgery (>25 yrs); LAPAROSCOPIC, CHOLECYSTECTOMY, CHOLANGIOGRAM (08/13/2014); Appendectomy (>25 yrs); Spine surgery (11/2013); EGD (01/2015); LAPAROSCOPIC, GASTRIC BYPASS (N/A, 10/22/2015); INSERTION, PAIN PUMP (MEDICAL) (N/A, 10/22/2015); LAPAROSCOPIC, HERNIORRHAPHY, HIATAL (N/A, 10/22/2015); Cholecystectomy; and EGD, BIOPSY (N/A, 09/15/2017).          Social/Family History:      She reports that she has never smoked. She has never used smokeless tobacco. She reports that she does not drink alcohol or use drugs.    Family History   Problem Relation Age of Onset   . Cancer Mother 78        breast cancer   . Breast cancer Mother    . Heart disease Father    . Malignant hyperthermia Neg Hx    . Pseudochol deficiency Neg Hx          Listed Medications on Arrival:    .     Home Medications             albuterol (PROVENTIL HFA;VENTOLIN HFA) 108 (90 BASE) MCG/ACT inhaler     Inhale into the lungs every 4 (four) hours as needed.        amLODIPine (NORVASC) 5 MG tablet     Take 1 tablet (5 mg total) by mouth daily     aspirin EC 81 MG EC tablet     Take 1 tablet (81 mg total) by mouth daily.     B Complex Vitamins (VITAMIN-B COMPLEX) Tab     Take  by Mouth every day     cholecalciferol (VITAMIN D-1000 MAX ST) 1000 units tablet     Take 1,000 Units by Mouth Once a Day at lunch time     diazePAM (VALIUM) 2 MG tablet     Take 1 tablet (2 mg total) by mouth every 8 (eight) hours as needed for Anxiety.     fluticasone (FLONASE) 50 MCG/ACT nasal spray     2 sprays by Nasal route daily.     Patient taking differently:  2 sprays by  Nasal route as needed.        Multiple Vitamin (MULTIVITAMIN) capsule     Take 1 capsule by mouth daily     nystatin (NYSTOP) powder     Apply to affected area 3 times daily     pantoprazole (PROTONIX) 40 MG tablet     TAKE 1 TABLET(40 MG) BY MOUTH DAILY     spironolactone (ALDACTONE) 25 MG tablet     Take 1 tablet (25 mg total) by mouth daily     valACYclovir HCL (VALTREX) 500 MG tablet     TAKE 1 TABLET(500 MG) BY MOUTH DAILY     Patient taking differently:  daily        vitamin B-12 (CYANOCOBALAMIN) 100 MCG tablet     Take 50 mcg by mouth every other day         Allergies: She is allergic to acyclovir; amlodipine; amoxicillin-pot clavulanate;  aspirin; atorvastatin; carvedilol; lisinopril; lovastatin; metronidazole; moxifloxacin; other; rosuvastatin; statins; and sulfa antibiotics.            VISIT INFORMATION        Clinical Course in the ED:             Medications Given in the ED:    .     ED Medication Orders (From admission, onward)    Start Ordered     Status Ordering Provider    07/26/18 2212 07/26/18 2211  oseltamivir (TAMIFLU) 6 MG/ML oral suspension 75 mg  Once     Route: Oral  Ordered Dose: 75 mg     Last MAR action:  Given Gracelyn Nurse B    07/26/18 2112 07/26/18 2111  acetaminophen (TYLENOL) tablet 1,000 mg  Once     Route: Oral  Ordered Dose: 1,000 mg     Last MAR action:  Given Gracelyn Nurse B    07/26/18 2112 07/26/18 2112  dexamethasone (DECADRON) injection 10 mg  Once     Route: Oral  Ordered Dose: 10 mg     Last MAR action:  Given Gracelyn Nurse B    07/26/18 2111 07/26/18 2110  albuterol (PROVENTIL) (2.5 MG/3ML) 0.083% nebulizer solution 2.5 mg  RT - Once     Route: Nebulization  Ordered Dose: 2.5 mg     Last MAR action:  Given Gracelyn Nurse B    07/26/18 2111 07/26/18 2110  ipratropium (ATROVENT) 0.02 % nebulizer solution 0.5 mg  RT - Once     Route: Nebulization  Ordered Dose: 0.5 mg     Last MAR action:  Given Tarynn Garling B            Procedures:      Procedures        Interpretations:         Differential Diagnosis considered by MD (not completely inclusive): pleuritis, URI, pneumonia, viral syndrome, asthma/COPD exacerbation, respiratory failure, sepsis, pulmonary edema, myocarditis    Each of the differential diagnosis has been considered for diagnosis and weighed risk and benefit of further testing and evaluation in the context of patient complaint. Some diagnosis do not warrant further testing including imaging and/or labs. However, all differential diagnosis have been considered.    EKG was Reviewed, Analyzed and Interpreted by me: N/A    Rhythm Strip Interpretation / Cardiac Monitor Analysis: N/A    Pulse Ox Analysis: Yes: saturation: 99 %; Oxygen use: room air; Interpretation: Normal      Laboratory results reviewed and interpreted by me: Yes  Radiologic study results reviewed by me: Yes  Radiologic Studies Interpreted (viewed) by me: Yes    Discuss the patient with other providers: N/A         All tests, tracings, EKGs, vital signs and radiological studies above where applicable were interpreted by me, Gracelyn Nurse MD.             Critical Care:        No critical care.                 RESULTS        Lab Results:      Results     Procedure Component Value Units Date/Time    Rapid influenza A/B antigens [[id number]] Collected:  07/26/18 2116    Specimen:  Nasopharyngeal from Nasal Aspirate Updated:  07/26/18 2142    Narrative:       ORDER#: [id number]  ORDERED BY: Hawkin Charo  SOURCE: Nasal Aspirate                               COLLECTED:  07/26/18 21:16  ANTIBIOTICS AT COLL.:                                RECEIVED :  07/26/18 21:33  Influenza Rapid Antigen A&B                FINAL       07/26/18 21:42   +  07/26/18   Positive for Influenza A and Negative for Influenza B             Reference Range: Negative      Rapid Strep [[id number]] Collected:  07/26/18 2012    Specimen:  Throat Updated:  07/26/18 2030     Group A Strep, Rapid Antigen Negative               Radiology Results:      Chest 2 Views   Final Result          1.  No definite radiographic evidence of acute cardiopulmonary disease.      2.  No focal infiltrate is identified.      Tana Felts, MD    07/26/2018 10:06 PM                      Dot Been, MD  07/29/18 [phone]

## 2018-08-09 ENCOUNTER — Encounter (INDEPENDENT_AMBULATORY_CARE_PROVIDER_SITE_OTHER): Payer: Self-pay | Admitting: Cardiology

## 2018-08-09 ENCOUNTER — Ambulatory Visit (INDEPENDENT_AMBULATORY_CARE_PROVIDER_SITE_OTHER): Payer: Medicare Other | Admitting: Cardiology

## 2018-08-09 VITALS — BP 162/88 | HR 77 | Ht 61.81 in | Wt 136.0 lb

## 2018-08-09 DIAGNOSIS — R110 Nausea: Secondary | ICD-10-CM

## 2018-08-09 DIAGNOSIS — Z9884 Bariatric surgery status: Secondary | ICD-10-CM

## 2018-08-09 DIAGNOSIS — I10 Essential (primary) hypertension: Secondary | ICD-10-CM

## 2018-08-09 DIAGNOSIS — Z8673 Personal history of transient ischemic attack (TIA), and cerebral infarction without residual deficits: Secondary | ICD-10-CM

## 2018-08-09 DIAGNOSIS — R002 Palpitations: Secondary | ICD-10-CM

## 2018-08-09 MED ORDER — AMLODIPINE BESYLATE 5 MG PO TABS
2.5000 mg | ORAL_TABLET | Freq: Every day | ORAL | 3 refills | Status: DC
Start: 2018-08-09 — End: 2018-11-28

## 2018-08-09 NOTE — Progress Notes (Signed)
[name] [phone]. with a history of HTN presents for cardiac follow up    Since her gastric surgery, she has been intolerant to medications due to GI distress. Also notes constant nausea with food. Developed abdominal cramping with norvasc 5 mg daily- symptoms improved when decreased to 2.5 mg daily. Tolerating aldactone 25 mg daily      HTN              - blood pressure better controlled on norvasc 2.5 mg daily / aldactone 25 mg daily    Cardiac work up has included  - stress test (11/06/17 - normal myocardial perfusion)  - cardiac cath ( 01/08/14 - normal )  - echocardiogram ( EF 55%, mild TR)  - 24 hour holter - no significant events  CVD  - hx of TIA - occurred when not on ASA  - no recurrence        Palpitations  - recurrent daily palpitations  - no significant events on 30 day event monitor in the past        Past Medical History:   Diagnosis Date   . Asthma    . Chest pain 2016     Chest pain after eating x6  months--being followed by GI for GERD   . Constipation    . Difficulty walking     amb with cane secondary to arthritis bil knees   . Genital herpes     no current outbreak (10/03/15)   . GERD (gastroesophageal reflux disease)    . Hiatal hernia     h/o surgery   . Hyperlipidemia    . Hypertensive disorder     Well controlled on med. Denies any SOB in last 6 months (10/03/15)   . Low back pain    . Morbid obesity with BMI of 40.0-44.9, adult     BMI 40.9   . Nausea without vomiting    . OSA on CPAP 2015    CPAP nightly x 1 yr   . Osteoarthritis of knees, bilateral     and back   . TIA (transient ischemic attack) 2014    TIA 2014-no residual. Patient evaulated for possible TIA 07/12/2015  @ [facility] (dischage summary in epic)-per summary CT of head was normal and patient was d/c'd     Family History   Problem Relation Age of Onset   . Cancer Mother 58        breast cancer   . Breast cancer Mother    .  Heart disease Father    . Malignant hyperthermia Neg Hx    . Pseudochol deficiency Neg Hx      Current Outpatient Medications   Medication Sig Dispense Refill   . albuterol (PROVENTIL HFA;VENTOLIN HFA) 108 (90 BASE) MCG/ACT inhaler Inhale into the lungs every 4 (four) hours as needed.        Marland Kitchen amLODIPine (NORVASC) 5 MG tablet Take 0.5 tablets (2.5 mg total) by mouth daily 90 tablet 3   . aspirin EC 81 MG EC tablet Take 1 tablet (81 mg total) by mouth daily. 30 tablet 1   . B Complex Vitamins (VITAMIN-B COMPLEX) Tab Take  by Mouth every day     . cholecalciferol (VITAMIN D-1000 MAX ST) 1000 units tablet Take 1,000 Units by Mouth Once a Day at lunch time     . diazePAM (VALIUM) 2 MG tablet Take 1 tablet (2 mg total) by mouth every 8 (eight) hours as needed for Anxiety. 30  tablet 2   . fluticasone (FLONASE) 50 MCG/ACT nasal spray 2 sprays by Nasal route daily. (Patient taking differently: 2 sprays by Nasal route as needed.   ) 16 g 2   . Multiple Vitamin (MULTIVITAMIN) capsule Take 1 capsule by mouth daily     . nystatin (NYSTOP) powder Apply to affected area 3 times daily 60 g 1   . pantoprazole (PROTONIX) 40 MG tablet TAKE 1 TABLET(40 MG) BY MOUTH DAILY 90 tablet 0   . spironolactone (ALDACTONE) 25 MG tablet Take 1 tablet (25 mg total) by mouth daily 90 tablet 3   . valACYclovir HCL (VALTREX) 500 MG tablet TAKE 1 TABLET(500 MG) BY MOUTH DAILY (Patient taking differently: daily   ) 90 tablet 0   . vitamin B-12 (CYANOCOBALAMIN) 100 MCG tablet Take 50 mcg by mouth every other day       No current facility-administered medications for this visit.         PE:    Vitals:    08/09/18 0825   BP: 162/88   Pulse: 77     Body mass index is 25.03 kg/m.    General: nad  cv rr  Lung cta  abd s  Ext no le edema        Labs:  Lipid Panel   Cholesterol   Date/Time Value Ref Range Status   01/25/2018 05:06 AM 151 0 - 199 mg/dL Final     Triglycerides   Date/Time Value Ref Range Status   01/25/2018 05:06 AM 26 (L) 34 - 149 mg/dL Final      HDL   Date/Time Value Ref Range Status   01/25/2018 05:06 AM 53 40 - 9,999 mg/dL Final     Comment:     An HDL cholesterol <40 mg/dL is low and constitutes a  coronary heart disease risk factor, and HDL-C>59 mg/dL is  a negative risk factor for CHD.  Ref: American Heart Association; Circulation 2004         CMP:   Sodium   Date/Time Value Ref Range Status   06/16/2018 09:24 AM 141 136 - 145 mEq/L Final     Potassium   Date/Time Value Ref Range Status   06/16/2018 09:24 AM 3.9 3.5 - 5.1 mEq/L Final     Chloride   Date/Time Value Ref Range Status   06/16/2018 09:24 AM 108 100 - 111 mEq/L Final   11/10/2017 07:40 AM 107 98 - 110 mmol/L Final     CO2   Date/Time Value Ref Range Status   06/16/2018 09:24 AM 23 22 - 29 mEq/L Final     Glucose   Date/Time Value Ref Range Status   06/16/2018 09:24 AM 95 70 - 100 mg/dL Final     Comment:     ADA guidelines for diabetes mellitus:  Fasting:  Equal to or greater than 126 mg/dL  Random:   Equal to or greater than 200 mg/dL       BUN   Date/Time Value Ref Range Status   06/16/2018 09:24 AM 8.0 7.0 - 19.0 mg/dL Final     Protein, Total   Date/Time Value Ref Range Status   06/16/2018 09:24 AM 6.6 6.0 - 8.3 g/dL Final   54/07/8118 14:78 AM 6.4 6.1 - 8.1 g/dL Final     Alkaline Phosphatase   Date/Time Value Ref Range Status   06/16/2018 09:24 AM 86 37 - 106 U/L Final     AST (SGOT)   Date/Time Value Ref Range  Status   06/16/2018 09:24 AM 24 5 - 34 U/L Final     ALT   Date/Time Value Ref Range Status   06/16/2018 09:24 AM 14 0 - 55 U/L Final     Anion Gap   Date/Time Value Ref Range Status   06/16/2018 09:24 AM 10.0 5.0 - 15.0 Final       CBC:   WBC   Date/Time Value Ref Range Status   06/16/2018 09:43 AM 3.60 3.10 - 9.50 x10 3/uL Final   06/30/2009 04:03 PM 9.12 3.50 - 10.80 /CUMM Final     RBC   Date/Time Value Ref Range Status   06/16/2018 09:43 AM 4.28 3.90 - 5.10 x10 6/uL Final     Hemoglobin   Date/Time Value Ref Range Status   11/10/2017 07:40 AM 12.5 11.7 - 15.5 g/dL Final      Hgb   Date/Time Value Ref Range Status   06/16/2018 09:43 AM 12.1 11.4 - 14.8 g/dL Final     Hematocrit   Date/Time Value Ref Range Status   06/16/2018 09:43 AM 37.5 34.7 - 43.7 % Final     MCV   Date/Time Value Ref Range Status   06/16/2018 09:43 AM 87.6 78.0 - 96.0 fL Final     MCHC   Date/Time Value Ref Range Status   06/16/2018 09:43 AM 32.3 31.5 - 35.8 g/dL Final     RDW   Date/Time Value Ref Range Status   06/16/2018 09:43 AM 14 11 - 15 % Final     Platelets   Date/Time Value Ref Range Status   06/16/2018 09:43 AM 228 142 - 346 x10 3/uL Final   11/10/2017 07:40 AM 266 140 - 400 Thousand/uL Final           Impression / plan    HTN   - intolerance to many medications   - better controlled   - continue norvasc 2.5 mg daily and aldactone 25 mg daily    Palpitations   - check 24 hour holter    Persistent nausea, worse after eating, inability to take medications   - s/p bariatric surgery   - fu with bariatric surgery      Kermit Arnette Dan Humphreys  08/09/2018

## 2018-08-10 ENCOUNTER — Encounter (INDEPENDENT_AMBULATORY_CARE_PROVIDER_SITE_OTHER): Payer: Self-pay | Admitting: Surgery

## 2018-08-10 ENCOUNTER — Ambulatory Visit (INDEPENDENT_AMBULATORY_CARE_PROVIDER_SITE_OTHER): Payer: Medicare Other | Admitting: Surgery

## 2018-08-10 ENCOUNTER — Encounter (INDEPENDENT_AMBULATORY_CARE_PROVIDER_SITE_OTHER): Payer: Self-pay

## 2018-08-10 VITALS — BP 136/84 | HR 89 | Temp 97.7°F | Ht 61.0 in | Wt 138.0 lb

## 2018-08-10 DIAGNOSIS — Z9884 Bariatric surgery status: Secondary | ICD-10-CM

## 2018-08-10 DIAGNOSIS — R110 Nausea: Secondary | ICD-10-CM

## 2018-08-10 DIAGNOSIS — R10816 Epigastric abdominal tenderness: Secondary | ICD-10-CM

## 2018-08-10 DIAGNOSIS — E639 Nutritional deficiency, unspecified: Secondary | ICD-10-CM

## 2018-08-10 DIAGNOSIS — Z1321 Encounter for screening for nutritional disorder: Secondary | ICD-10-CM

## 2018-08-10 NOTE — Progress Notes (Signed)
ASSESSMENT:  Bariatric surgery status  Nausea  Feeling of food getting stuck  Epigastric pain        PLAN:  Patient will be scheduled for EGD. I explained to the patient that she may have a anastomotic ulcer or anastomotic stricture.  we will get a full set of labs including all vitamin levels.  Patient will follow-up with me after her endoscopy and labs.  Increase water intake.      SUBJECTIVE:  The patient is a 67 y.o.  female who presents s/p gastric bypass 3 years ago and with significant weigh loss with complaint of nausea, epigastric pain radiating to her back, and feeling of food getting stuck.  She feels run down.  She admits to drinking about 50 oz of liquid per day.  She denies any diarrhea or constipation.  She denies having symptoms after sweets.        The following portions of the patient's history were reviewed and updated as appropriate: allergies, current medications, past family history, past medical history, past social history, past surgical history and problem list.    CURRENT PROBLEM LIST:   Patient Active Problem List   Diagnosis   . Essential hypertension   . Anxiety state, unspecified   . Abnormal electrocardiogram   . Cerebral infarction   . Gastroesophageal reflux disease   . Hypernatremia   . Paresthesia   . Slow transit constipation   . Vitamin D deficiency   . Overactive bladder   . S/P laparoscopic cholecystectomy   . Transient cerebral ischemia, unspecified type   . Palpitations   . Pituitary microadenoma   . Dyslipidemia   . Other long term (current) drug therapy   . Headache   . History of spinal surgery   . Obstructive sleep apnea syndrome   . Osteoarthritis of knees, bilateral   . Bariatric surgery status   . Nutritional deficiency   . Anxiety   . Epigastric abdominal tenderness without rebound tenderness   . Encounter for vitamin deficiency screening     PAST MEDICAL HISTORY:   Past Medical History:   Diagnosis Date   . Asthma    . Chest pain 2016     Chest pain after eating x6   months--being followed by GI for GERD   . Constipation    . Difficulty walking     amb with cane secondary to arthritis bil knees   . Genital herpes     no current outbreak (10/03/15)   . GERD (gastroesophageal reflux disease)    . Hiatal hernia     h/o surgery   . Hyperlipidemia    . Hypertensive disorder     Well controlled on med. Denies any SOB in last 6 months (10/03/15)   . Low back pain    . Morbid obesity with BMI of 40.0-44.9, adult     BMI 40.9   . Nausea without vomiting    . OSA on CPAP 2015    CPAP nightly x 1 yr   . Osteoarthritis of knees, bilateral     and back   . TIA (transient ischemic attack) 2014    TIA 2014-no residual. Patient evaulated for possible TIA 07/12/2015  @ [facility] (dischage summary in epic)-per summary CT of head was normal and patient was d/c'd     PAST SURGICAL HISTORY:   Past Surgical History:   Procedure Laterality Date   . APPENDECTOMY  >25 yrs   . CHOLECYSTECTOMY     . EGD  01/2015   . EGD, BIOPSY N/A 09/15/2017    Procedure: EGD, BIOPSY;  Surgeon: Pershing Proud, MD;  Location: [facility];  Service: Gastroenterology;  Laterality: N/A;   . fallopian tube surgery  >25 yrs   . INSERTION, PAIN PUMP (MEDICAL) N/A 10/22/2015    Procedure: INSERTION, PAIN PUMP (MEDICAL);  Surgeon: Nicola Police, DO;  Location: [facility];  Service: General;  Laterality: N/A;   . LAPAROSCOPIC, CHOLECYSTECTOMY, CHOLANGIOGRAM  08/13/2014   . LAPAROSCOPIC, GASTRIC BYPASS N/A 10/22/2015    Procedure: LAPAROSCOPIC, GASTRIC BYPASS;  Surgeon: Josefa Half R, DO;  Location: [facility];  Service: General;  Laterality: N/A;   . LAPAROSCOPIC, HERNIORRHAPHY, HIATAL N/A 10/22/2015    Procedure: LAPAROSCOPIC, HERNIORRHAPHY, HIATAL;  Surgeon: Jeanell Sparrow, Linnell Swords R, DO;  Location:  MAIN OR;  Service: General;  Laterality: N/A;   . OVARY SURGERY Right >25 yrs   . SPINE SURGERY  11/2013    cervical HNP x 2, Dr. Bufford Buttner     TOBACCO HISTORY:   Social History     Tobacco Use   Smoking  Status Never Smoker   Smokeless Tobacco Never Used     ALCOHOL HISTORY:   Social History     Substance and Sexual Activity   Alcohol Use No     DRUG HISTORY:   Social History     Substance and Sexual Activity   Drug Use No     CURRENT OUTPATIENT MEDICATIONS:   Outpatient Medications Marked as Taking for the 08/10/18 encounter (Office Visit) with Yashika Mask R, DO   Medication Sig Dispense Refill   . amLODIPine (NORVASC) 5 MG tablet Take 0.5 tablets (2.5 mg total) by mouth daily 90 tablet 3   . aspirin EC 81 MG EC tablet Take 1 tablet (81 mg total) by mouth daily. 30 tablet 1   . B Complex Vitamins (VITAMIN-B COMPLEX) Tab Take  by Mouth every day     . cholecalciferol (VITAMIN D-1000 MAX ST) 1000 units tablet Take 1,000 Units by Mouth Once a Day at lunch time     . Multiple Vitamin (MULTIVITAMIN) capsule Take 1 capsule by mouth daily     . nystatin (NYSTOP) powder Apply to affected area 3 times daily 60 g 1   . pantoprazole (PROTONIX) 40 MG tablet TAKE 1 TABLET(40 MG) BY MOUTH DAILY 90 tablet 0   . spironolactone (ALDACTONE) 25 MG tablet Take 1 tablet (25 mg total) by mouth daily 90 tablet 3   . valACYclovir HCL (VALTREX) 500 MG tablet TAKE 1 TABLET(500 MG) BY MOUTH DAILY (Patient taking differently: daily   ) 90 tablet 0   . vitamin B-12 (CYANOCOBALAMIN) 100 MCG tablet Take 50 mcg by mouth every other day       ALLERGIES:   Allergies   Allergen Reactions   . Acyclovir Nausea And Vomiting     Other reaction(s): gi distress   . Amlodipine      nausea   . Amoxicillin-Pot Clavulanate      Other reaction(s): gi distress     . Aspirin Nausea And Vomiting     Patient states "it upsets my stomach"  Other reaction(s): gi distress  Upsets stomach  Able to tolerate enteric coated aspirin   . Atorvastatin        Palpitation   . Carvedilol      Extreme fatigue   . Lisinopril      nausea   . Lovastatin Nausea And Vomiting  Other reaction(s): gi distress   . Metronidazole Nausea And Vomiting     Other reaction(s): gi  distress   . Moxifloxacin Nausea And Vomiting         GI symptoms   . Other Nausea And Vomiting   . Rosuvastatin      Palpitation, chest pain, abd pain    . Statins    . Sulfa Antibiotics Nausea And Vomiting     Other reaction(s): gi distress       OBJECTIVE:  BP 136/84   Pulse 89   Temp 97.7 F (36.5 C) (Oral)   Ht 5\' 1"    Wt 138 lb   BMI 26.07 kg/m     General appearance: alert, appears stated age and cooperative  Head: Normocephalic, without obvious abnormality, atraumatic  Abdomen: Epigastric tenderness with guarding.  Abdomen is soft, nondistended, positive bowel sounds.      Chest 2 Views    Result Date: 07/26/2018  XR CHEST 2 VIEWS CLINICAL INDICATION:   cough, sore throat, nasal congestion TECHNIQUE: Chest 2 views 2 images COMPARISON: Chest radiograph 06/14/2018 FINDINGS: Sequelae from anterior cervical discectomy and fusion are again noted.  No focal consolidations are seen in the lungs and there are no pleural effusions. The cardiomediastinal silhouette is within normal limits for size and contour. No acute osseous abnormality is identified.      1.  No definite radiographic evidence of acute cardiopulmonary disease. 2.  No focal infiltrate is identified. Tana Felts, MD 07/26/2018 10:06 PM        Faryal Marxen R. Deisha Stull, DO, FACS, FASMBS, FACOS

## 2018-08-15 ENCOUNTER — Other Ambulatory Visit (INDEPENDENT_AMBULATORY_CARE_PROVIDER_SITE_OTHER): Payer: Self-pay | Admitting: Internal Medicine

## 2018-08-16 ENCOUNTER — Other Ambulatory Visit (INDEPENDENT_AMBULATORY_CARE_PROVIDER_SITE_OTHER): Payer: Self-pay | Admitting: Surgery

## 2018-08-18 ENCOUNTER — Ambulatory Visit (INDEPENDENT_AMBULATORY_CARE_PROVIDER_SITE_OTHER): Payer: Medicare Other | Admitting: Cardiovascular Disease

## 2018-08-18 DIAGNOSIS — R002 Palpitations: Secondary | ICD-10-CM

## 2018-08-18 NOTE — Progress Notes (Signed)
HOLTER REPORT    [facility] - [facility]  Tel [phone]      PATIENT:  Amber [name]         MRN: [id number].  Gender: female.  DOB: 1951/04/22.  Age: 67 y.o.  Date of study:  08/09/2018    Ordering Physician:  Denyse Dago, MD  Primary Physician:  Oneita Jolly, MD  Primary Cardiologist:  Denyse Dago, MD    INDICATION:    1. Palpitations          DATA:  Test of hookup:  08/09/2018  Recording time:  24 hours  Quality:  good   Tech comments:      Heart Rate Data     Total beats: 115910   Min HR: 57   Avg HR: 82   Max HR: 121     Pauses > 2.0 sec: 0        Longest: 0 sec  Ventricular Ectopy     Total VE beats: 1 (<0.1%)   Vent runs: 0 events          Longest: 0 beats          Fastest: 0 bpm   Triplets: 0 events   Couplets: 0 events   Supraventricular Ectopy     Total SVE beats: 0 (<0.1%)   Atrial runs: 0 events          Longest: 0 beats          Fastest: 0 bpm   Atrial pairs: 0 events     FINDINGS:  Baseline rhythm: Normal sinus rhythm    Arrhythmia:  none    Symptoms reported:none  Symptom correlation with arrhythmia:  none    IMPRESSION:   Unremarkable holter monitor.   No arrhythmias detected.   No apparent symptoms.      Interpreted and electronically signed by:  Renard Hamper, MD, MS  [facility], [facility] - [place] - Norberto Sorenson - Faythe Dingwall

## 2018-08-21 LAB — COMPREHENSIVE METABOLIC PANEL
ALT: 10 U/L (ref 6–29)
AST (SGOT): 14 U/L (ref 10–35)
Albumin/Globulin Ratio: 1.7 (calc) (ref 1.0–2.5)
Albumin: 4.1 g/dL (ref 3.6–5.1)
Alkaline Phosphatase: 87 U/L (ref 33–130)
BUN: 11 mg/dL (ref 7–25)
Bilirubin, Total: 0.5 mg/dL (ref 0.2–1.2)
CO2: 32 mmol/L (ref 20–32)
Calcium: 9.5 mg/dL (ref 8.6–10.4)
Chloride: 107 mmol/L (ref 98–110)
Creatinine: 0.67 mg/dL (ref 0.50–0.99)
EGFR African American: 107 mL/min/{1.73_m2} (ref >=60)
Globulin: 2.4 g/dL (calc) (ref 1.9–3.7)
Glucose: 88 mg/dL (ref 65–99)
NON-AFRICAN AMERICA EGFR: 92 mL/min/{1.73_m2} (ref >=60)
Potassium: 4.5 mmol/L (ref 3.5–5.3)
Protein, Total: 6.5 g/dL (ref 6.1–8.1)
Sodium: 141 mmol/L (ref 135–146)

## 2018-08-21 LAB — COPPER, SERUM: Copper: 111 ug/dL (ref 70–175)

## 2018-08-21 LAB — CBC
Hematocrit: 37 % (ref 35.0–45.0)
Hemoglobin: 12.3 g/dL (ref 11.7–15.5)
MCH: 28.6 pg (ref 27.0–33.0)
MCHC: 33.2 g/dL (ref 32.0–36.0)
MCV: 86 fL (ref 80.0–100.0)
MPV: 10.2 fL (ref 7.5–12.5)
Platelets: 252 10*3/uL (ref 140–400)
RBC: 4.3 10*6/uL (ref 3.80–5.10)
RDW: 14.3 % (ref 11.0–15.0)
WBC: 3.4 10*3/uL — ABNORMAL LOW (ref 3.8–10.8)

## 2018-08-21 LAB — VITAMIN B1, PLASMA: Vitamin B1 (Thiamine): 12 nmol/L (ref 8–30)

## 2018-08-21 LAB — LIPID PANEL
Cholesterol / HDL Ratio: 2.3 (calc) (ref <5.0)
Cholesterol: 187 mg/dL (ref <200)
HDL: 80 mg/dL (ref >50)
LDL Calculated: 92 mg/dL (calc)
NON HDL CHOLESTEROL: 107 mg/dL (calc) (ref <130)
Triglycerides: 61 mg/dL (ref <150)

## 2018-08-21 LAB — VITAMIN D,25 OH,TOTAL: Vitamin D, 25 OH, Total: 26 ng/mL — ABNORMAL LOW (ref 30–100)

## 2018-08-21 LAB — PTH, INTACT AND CALCIUM
Calcium: 9.5 mg/dL (ref 8.6–10.4)
PARATHYROID HORMONE INTACT: 38 pg/mL (ref 14–64)

## 2018-08-21 LAB — VITAMIN A: Vitamin A: 49 ug/dL (ref 38–98)

## 2018-08-21 LAB — FOLATE: Folate: 14.6 ng/mL

## 2018-08-21 LAB — TSH: TSH: 2.28 mIU/L (ref 0.40–4.50)

## 2018-08-21 LAB — VITAMIN B12: Vitamin B-12: 333 pg/mL (ref 200–1100)

## 2018-08-21 LAB — IRON PROFILE
Iron Saturation: 20 % (calc) (ref 16–45)
Iron: 76 ug/dL (ref 45–160)
TIBC: 380 mcg/dL (calc) (ref 250–450)

## 2018-08-21 LAB — ZINC: Zinc: 54 ug/dL — ABNORMAL LOW (ref 60–130)

## 2018-08-23 ENCOUNTER — Telehealth (INDEPENDENT_AMBULATORY_CARE_PROVIDER_SITE_OTHER): Payer: Self-pay

## 2018-08-23 ENCOUNTER — Encounter (INDEPENDENT_AMBULATORY_CARE_PROVIDER_SITE_OTHER): Payer: Self-pay

## 2018-08-23 NOTE — Telephone Encounter (Signed)
Pt contacted re: her post op bypass labs. Pt is 3 years post gastric  Bypass.  Pt reports she has been having problems since. Pt reports she had issues with constipation.  Pt reports she has not been able to take her vitamin/mineral supplements for a couple of months.  Pt reports she switched to Flintstones multi and she started getting sick off of those.  Pt reports Bariatric Supplements are expensive. Pt has not been into office for a nutrition f/u recently.  Pt states she still can not eat much.  Will have scheduler contact pt to have pt come into office for f/u to review labs, intake, and physical activity.  Pt agreed and verbalized understanding.

## 2018-08-26 ENCOUNTER — Encounter (FREE_STANDING_LABORATORY_FACILITY): Payer: Medicare Other

## 2018-08-26 ENCOUNTER — Encounter (INDEPENDENT_AMBULATORY_CARE_PROVIDER_SITE_OTHER): Payer: Self-pay | Admitting: Surgery

## 2018-08-26 ENCOUNTER — Ambulatory Visit (INDEPENDENT_AMBULATORY_CARE_PROVIDER_SITE_OTHER): Payer: Medicare Other | Admitting: Surgery

## 2018-08-26 ENCOUNTER — Ambulatory Visit: Payer: Self-pay

## 2018-08-26 DIAGNOSIS — K209 Esophagitis, unspecified: Secondary | ICD-10-CM

## 2018-08-26 DIAGNOSIS — K210 Gastro-esophageal reflux disease with esophagitis: Secondary | ICD-10-CM

## 2018-08-26 DIAGNOSIS — R1319 Other dysphagia: Secondary | ICD-10-CM

## 2018-08-26 DIAGNOSIS — K449 Diaphragmatic hernia without obstruction or gangrene: Secondary | ICD-10-CM

## 2018-08-26 DIAGNOSIS — K2950 Unspecified chronic gastritis without bleeding: Secondary | ICD-10-CM

## 2018-08-26 DIAGNOSIS — Z9884 Bariatric surgery status: Secondary | ICD-10-CM

## 2018-08-26 DIAGNOSIS — K219 Gastro-esophageal reflux disease without esophagitis: Secondary | ICD-10-CM

## 2018-08-27 ENCOUNTER — Other Ambulatory Visit: Payer: Self-pay | Admitting: Surgery

## 2018-08-27 MED ORDER — HYOSCYAMINE SULFATE 0.125 MG PO TABS
0.1250 mg | ORAL_TABLET | ORAL | 1 refills | Status: DC | PRN
Start: 2018-08-27 — End: 2018-09-06

## 2018-08-29 ENCOUNTER — Ambulatory Visit (INDEPENDENT_AMBULATORY_CARE_PROVIDER_SITE_OTHER): Payer: Medicare Other

## 2018-08-29 VITALS — Ht 61.0 in | Wt 138.0 lb

## 2018-08-29 DIAGNOSIS — E559 Vitamin D deficiency, unspecified: Secondary | ICD-10-CM

## 2018-08-29 DIAGNOSIS — E60 Dietary zinc deficiency: Secondary | ICD-10-CM

## 2018-08-29 DIAGNOSIS — Z9884 Bariatric surgery status: Secondary | ICD-10-CM

## 2018-08-29 DIAGNOSIS — Z6826 Body mass index (BMI) 26.0-26.9, adult: Secondary | ICD-10-CM

## 2018-08-29 DIAGNOSIS — Z719 Counseling, unspecified: Secondary | ICD-10-CM

## 2018-08-29 DIAGNOSIS — Z713 Dietary counseling and surveillance: Secondary | ICD-10-CM

## 2018-08-29 DIAGNOSIS — Z09 Encounter for follow-up examination after completed treatment for conditions other than malignant neoplasm: Secondary | ICD-10-CM

## 2018-08-29 NOTE — Progress Notes (Signed)
S:Pt presents for  nutrition f/u visit after gastric bypass surgery (10/23/15). Pt is on regular foods, reports she cannot eat more than 1/2 cup.  Pt reports she is measuring.   For physical activity, pt reports she stays sick, doesn't feel good all the time so she doesn't feel like doing anything.    Pt reports she does not feel good.    Pt reports she just had upper GI and was informed hernia had come back, however, no surgery required at this time.    Vitamin/mineral Supplements: Pt reports she is not taking any vitamins because they all make her sick (Flinstones, Bariatric, and private OTC labels).     Pt reports she is also not taking any protein supplements. Pt reports premier protein makes her sick.    24 Hour Diet Recall:    Breakfast: 1/2 bagel, 6 oz scrapple    Lunch: 4oz salmon and 1/3 cup brussel sprouts (28 grams protein)    Dinner: 8 ounces Baked Chicken; 1/2 cup rice; 1/2 cup broccoli, 2 tablespoon stuffing.      in/day and 64 ounces fluid/day    Estimated nutrition needs: 60 grams protein/1200 calories/ 20 grams fiber/ 64 or more ounces fluid/day.    O:  Today's Wt: 138 lb   Previous Wt:   Wt Readings from Last 10 Encounters:   08/29/18 138 lb   08/10/18 138 lb   08/09/18 136 lb   07/26/18 135 lb   07/07/18 137 lb   06/20/18 139 lb 9.6 oz   06/16/18 132 lb   05/03/18 133 lb 3.2 oz   04/27/18 133 lb 6.4 oz   04/14/18 132 lb      Total Wt Loss:  BMI:  Body mass index is 26.07 kg/m.   Labs:  Component      Latest Ref Rng & Units 08/16/2018           7:34 AM   Albumin      3.6 - 5.1 g/dL 4.1     Component      Latest Ref Rng & Units 08/16/2018           7:34 AM   Zinc      60 - 130 mcg/dL 54 (L)     Component      Latest Ref Rng & Units 08/16/2018           7:34 AM   Vitamin D 25-OH Total      30 - 100 ng/mL 26 (L)     Component      Latest Ref Rng & Units 08/16/2018           7:34 AM   Vitamin B-12      200 - 1,100 pg/mL 333     Allergy:    Allergies   Allergen Reactions   . Acyclovir Nausea  And Vomiting     Other reaction(s): gi distress   . Amlodipine      nausea   . Amoxicillin-Pot Clavulanate      Other reaction(s): gi distress     . Aspirin Nausea And Vomiting     Patient states "it upsets my stomach"  Other reaction(s): gi distress  Upsets stomach  Able to tolerate enteric coated aspirin   . Atorvastatin        Palpitation   . Carvedilol      Extreme fatigue   . Lisinopril      nausea   . Lovastatin Nausea And Vomiting  Other reaction(s): gi distress   . Metronidazole Nausea And Vomiting     Other reaction(s): gi distress   . Moxifloxacin Nausea And Vomiting         GI symptoms   . Other Nausea And Vomiting   . Rosuvastatin      Palpitation, chest pain, abd pain    . Statins    . Sulfa Antibiotics Nausea And Vomiting     Other reaction(s): gi distress       A: Nutrition Diagnosis/ Assessment: Pt presents with inadequate protein/energy intake as evidenced by report, recall.  BMI noted (26).      Nutrition Intervention Included: Answered pt Nutrition Questions. Discussed various protein supplement/snack options.  Reviewed appropriate portion size for meals (~8 oz; half protein/half produce) and acceptable foods, provided with handout. Con't w/ minimum fluid (64 ounces or more); Increase physical activity, Adding resistance as able to physical activity; Cont' with 30/30 rule and all vitamin/mineral supplements. Educated pt on measuring and use of protein supplements as snacks. Provided with sample meal plan.  Answered patient questions. Encouraged pt to attend Support groups.     P:  1. Pt  60 grams protein/1200 calories/ 20 grams fiber/ 64 or more ounces fluid/day.  2. Portion size reviewed (6-8 ounces; half protein/half produce ; healthy carbs reviewed)  2. Add resistance.3-5 min q day.  3. Con't all vitamin/mineral supplements; provided with OTC handouts pt can swallow.  3. Pt to f/u in 4 months or PRN. Contact information provided for For questions/concerns.        Spent a  total of 30 minutes educating pt in a individual one-on-one setting. Plan reviewed with Dr. Jeanell Sparrow

## 2018-08-30 ENCOUNTER — Encounter (INDEPENDENT_AMBULATORY_CARE_PROVIDER_SITE_OTHER): Payer: Self-pay

## 2018-08-31 ENCOUNTER — Emergency Department: Payer: Medicare Other

## 2018-08-31 ENCOUNTER — Emergency Department
Admission: EM | Admit: 2018-08-31 | Discharge: 2018-09-01 | Disposition: A | Discharge status: Other Acute Inpatient Hospital | Payer: Medicare Other | Attending: Emergency Medicine | Admitting: Emergency Medicine

## 2018-08-31 DIAGNOSIS — J45909 Unspecified asthma, uncomplicated: Secondary | ICD-10-CM | POA: Insufficient documentation

## 2018-08-31 DIAGNOSIS — I10 Essential (primary) hypertension: Secondary | ICD-10-CM | POA: Insufficient documentation

## 2018-08-31 DIAGNOSIS — Z9049 Acquired absence of other specified parts of digestive tract: Secondary | ICD-10-CM | POA: Insufficient documentation

## 2018-08-31 DIAGNOSIS — K219 Gastro-esophageal reflux disease without esophagitis: Secondary | ICD-10-CM | POA: Insufficient documentation

## 2018-08-31 DIAGNOSIS — Z7951 Long term (current) use of inhaled steroids: Secondary | ICD-10-CM | POA: Insufficient documentation

## 2018-08-31 DIAGNOSIS — K56609 Unspecified intestinal obstruction, unspecified as to partial versus complete obstruction: Secondary | ICD-10-CM

## 2018-08-31 DIAGNOSIS — Z9884 Bariatric surgery status: Secondary | ICD-10-CM | POA: Insufficient documentation

## 2018-08-31 DIAGNOSIS — E785 Hyperlipidemia, unspecified: Secondary | ICD-10-CM | POA: Insufficient documentation

## 2018-08-31 DIAGNOSIS — Z7982 Long term (current) use of aspirin: Secondary | ICD-10-CM | POA: Insufficient documentation

## 2018-08-31 LAB — CBC AND DIFFERENTIAL
Absolute NRBC: 0 10*3/uL (ref 0.00–0.00)
Basophils Absolute Automated: 0.02 10*3/uL (ref 0.00–0.08)
Basophils Automated: 0.4 %
Eosinophils Absolute Automated: 0.07 10*3/uL (ref 0.00–0.44)
Eosinophils Automated: 1.3 %
Hematocrit: 39.3 % (ref 34.7–43.7)
Hgb: 12.6 g/dL (ref 11.4–14.8)
Immature Granulocytes Absolute: 0.01 10*3/uL (ref 0.00–0.07)
Immature Granulocytes: 0.2 %
Lymphocytes Absolute Automated: 1.42 10*3/uL (ref 0.42–3.22)
Lymphocytes Automated: 25.4 %
MCH: 27.8 pg (ref 25.1–33.5)
MCHC: 32.1 g/dL (ref 31.5–35.8)
MCV: 86.8 fL (ref 78.0–96.0)
MPV: 9.7 fL (ref 8.9–12.5)
Monocytes Absolute Automated: 0.54 10*3/uL (ref 0.21–0.85)
Monocytes: 9.6 %
Neutrophils Absolute: 3.54 10*3/uL (ref 1.10–6.33)
Neutrophils: 63.1 %
Nucleated RBC: 0 /100 WBC (ref 0.0–0.0)
Platelets: 259 10*3/uL (ref 142–346)
RBC: 4.53 10*6/uL (ref 3.90–5.10)
RDW: 15 % (ref 11–15)
WBC: 5.6 10*3/uL (ref 3.10–9.50)

## 2018-08-31 LAB — URINALYSIS REFLEX TO MICROSCOPIC EXAM - REFLEX TO CULTURE
Bilirubin, UA: NEGATIVE
Blood, UA: NEGATIVE
Glucose, UA: NEGATIVE
Ketones UA: NEGATIVE
Leukocyte Esterase, UA: NEGATIVE
Nitrite, UA: NEGATIVE
Protein, UR: NEGATIVE
Specific Gravity UA: 1.005 (ref 1.001–1.035)
Urine pH: 6 (ref 5.0–8.0)
Urobilinogen, UA: <2 mg/dL (ref 0.2–2.0)
WBC, UA: NONE SEEN /hpf (ref 0–5)

## 2018-08-31 LAB — COMPREHENSIVE METABOLIC PANEL
ALT: 13 U/L (ref 0–55)
AST (SGOT): 20 U/L (ref 5–34)
Albumin/Globulin Ratio: 1.4 (ref 0.9–2.2)
Albumin: 4.4 g/dL (ref 3.5–5.0)
Alkaline Phosphatase: 103 U/L (ref 37–106)
Anion Gap: 14 (ref 5.0–15.0)
BUN: 15 mg/dL (ref 7.0–19.0)
Bilirubin, Total: 0.4 mg/dL (ref 0.2–1.2)
CO2: 22 mEq/L (ref 22–29)
Calcium: 10.7 mg/dL — ABNORMAL HIGH (ref 8.5–10.5)
Chloride: 109 mEq/L (ref 100–111)
Creatinine: 0.7 mg/dL (ref 0.6–1.0)
Globulin: 3.2 g/dL (ref 2.0–3.6)
Glucose: 75 mg/dL (ref 70–100)
Potassium: 3.2 mEq/L — ABNORMAL LOW (ref 3.5–5.1)
Protein, Total: 7.6 g/dL (ref 6.0–8.3)
Sodium: 145 mEq/L (ref 136–145)

## 2018-08-31 LAB — PT AND APTT
PT INR: 1.1 (ref 0.9–1.1)
PT: 13.6 s (ref 12.6–15.0)
PTT: 27 s (ref 23–37)

## 2018-08-31 LAB — LAB USE ONLY - HISTORICAL SURGICAL PATHOLOGY

## 2018-08-31 LAB — LIPASE: Lipase: 11 U/L (ref 8–78)

## 2018-08-31 LAB — GFR: EGFR: >60

## 2018-08-31 LAB — TROPONIN I: Troponin I: <0.01 ng/mL (ref 0.00–0.05)

## 2018-08-31 MED ORDER — MORPHINE SULFATE 4 MG/ML IJ/IV SOLN (WRAP)
4.0000 mg | Freq: Once | Status: AC
Start: 2018-08-31 — End: 2018-08-31
  Administered 2018-08-31: 21:00:00 4 mg via INTRAVENOUS
  Filled 2018-08-31: qty 1

## 2018-08-31 MED ORDER — SODIUM CHLORIDE 0.9 % IV BOLUS
1000.00 mL | Freq: Once | INTRAVENOUS | Status: AC
Start: 2018-08-31 — End: 2018-08-31
  Administered 2018-08-31: 18:00:00 1000 mL via INTRAVENOUS

## 2018-08-31 MED ORDER — ONDANSETRON HCL 4 MG/2ML IJ SOLN
4.00 mg | Freq: Once | INTRAMUSCULAR | Status: AC
Start: 2018-08-31 — End: 2018-08-31
  Administered 2018-08-31: 18:00:00 4 mg via INTRAVENOUS
  Filled 2018-08-31: qty 2

## 2018-08-31 MED ORDER — MORPHINE SULFATE 4 MG/ML IJ/IV SOLN (WRAP)
4.0000 mg | Freq: Once | Status: AC
Start: 2018-08-31 — End: 2018-08-31
  Administered 2018-08-31: 19:00:00 4 mg via INTRAVENOUS
  Filled 2018-08-31: qty 1

## 2018-08-31 MED ORDER — MORPHINE SULFATE 4 MG/ML IJ/IV SOLN (WRAP)
4.0000 mg | Freq: Once | Status: AC
Start: 2018-08-31 — End: 2018-08-31
  Administered 2018-08-31: 18:00:00 4 mg via INTRAVENOUS
  Filled 2018-08-31: qty 1

## 2018-08-31 MED ORDER — METOCLOPRAMIDE HCL 5 MG/ML IJ SOLN
10.00 mg | Freq: Once | INTRAMUSCULAR | Status: AC
Start: 2018-08-31 — End: 2018-08-31
  Administered 2018-08-31: 22:00:00 10 mg via INTRAVENOUS
  Filled 2018-08-31: qty 2

## 2018-08-31 MED ORDER — FAMOTIDINE 10 MG/ML IV SOLN (WRAP)
20.00 mg | Freq: Once | INTRAVENOUS | Status: AC
Start: 2018-08-31 — End: 2018-08-31
  Administered 2018-08-31: 18:00:00 20 mg via INTRAVENOUS
  Filled 2018-08-31: qty 2

## 2018-08-31 MED ORDER — IOHEXOL 350 MG/ML IV SOLN
97.00 mL | Freq: Once | INTRAVENOUS | Status: AC | PRN
Start: 2018-08-31 — End: 2018-08-31
  Administered 2018-08-31: 21:00:00 97 mL via INTRAVENOUS

## 2018-08-31 MED ORDER — POTASSIUM CHLORIDE 10 MEQ/100ML IV SOLN
10.00 meq | Freq: Once | INTRAVENOUS | Status: AC
Start: 2018-08-31 — End: 2018-09-01
  Administered 2018-08-31: 23:00:00 10 meq via INTRAVENOUS
  Filled 2018-08-31: qty 100

## 2018-08-31 NOTE — ED Provider Notes (Signed)
EMERGENCY DEPARTMENT NOTE    Physician/Midlevel provider first contact with patient: 08/31/18 1720         HISTORY OF PRESENT ILLNESS   Historian:Patient  Translator Used: no    Chief Complaint: Abdominal Pain     67 y.o. female presents to the emergency department with 3 hours of mid abdominal pain associated with nausea without vomiting.  Patient notes normal bowel movements.  No dysuria hematuria.  No vaginal bleeding or discharge.  No chest pain or shortness of breath.  No fever chills.  Patient has a history of gastric bypass.    1. Location of symptoms: Abdomen  2. Onset of symptoms: 3 hours prior to arrival  3. What was patient doing when symptoms started (Context): see above  4. Severity: moderate  5. Timing: Constant  6. Activities that worsen symptoms: None  7. Activities that improve symptoms: None  8. Quality: Ache  9. Radiation of symptoms: no  10. Associated signs and Symptoms: see above  11. Are symptoms worsening? yes  MEDICAL HISTORY     Past Medical History:  Past Medical History:   Diagnosis Date   . Asthma    . Chest pain 2016     Chest pain after eating x6  months--being followed by GI for GERD   . Constipation    . Difficulty walking     amb with cane secondary to arthritis bil knees   . Genital herpes     no current outbreak (10/03/15)   . GERD (gastroesophageal reflux disease)    . Hiatal hernia     h/o surgery   . Hyperlipidemia    . Hypertensive disorder     Well controlled on med. Denies any SOB in last 6 months (10/03/15)   . Low back pain    . Morbid obesity with BMI of 40.0-44.9, adult     BMI 40.9   . Nausea without vomiting    . OSA on CPAP 2015    CPAP nightly x 1 yr   . Osteoarthritis of knees, bilateral     and back   . TIA (transient ischemic attack) 2014    TIA 2014-no residual. Patient evaulated for possible TIA 07/12/2015  @ [facility] (dischage summary in epic)-per summary CT of head was normal and patient was d/c'd       Past Surgical History:  Past Surgical History:   Procedure  Laterality Date   . APPENDECTOMY  >25 yrs   . CHOLECYSTECTOMY     . EGD  01/2015   . EGD, BIOPSY N/A 09/15/2017    Procedure: EGD, BIOPSY;  Surgeon: Pershing Proud, MD;  Location: [facility];  Service: Gastroenterology;  Laterality: N/A;   . fallopian tube surgery  >25 yrs   . INSERTION, PAIN PUMP (MEDICAL) N/A 10/22/2015    Procedure: INSERTION, PAIN PUMP (MEDICAL);  Surgeon: Nicola Police, DO;  Location: [facility];  Service: General;  Laterality: N/A;   . LAPAROSCOPIC, CHOLECYSTECTOMY, CHOLANGIOGRAM  08/13/2014   . LAPAROSCOPIC, GASTRIC BYPASS N/A 10/22/2015    Procedure: LAPAROSCOPIC, GASTRIC BYPASS;  Surgeon: Josefa Half R, DO;  Location: [facility];  Service: General;  Laterality: N/A;   . LAPAROSCOPIC, HERNIORRHAPHY, HIATAL N/A 10/22/2015    Procedure: LAPAROSCOPIC, HERNIORRHAPHY, HIATAL;  Surgeon: Nicola Police, DO;  Location: [facility];  Service: General;  Laterality: N/A;   . LAPAROSCOPIC, LYSIS, ADHESIONS N/A 09/01/2018    Procedure: LAPAROSCOPIC, LYSIS, ADHESIONS RELEASE OF  SMALL  BOWEL OBSTRUCTION;  Surgeon: Champ Mungo, MD;  Location[facility];  Service: General;  Laterality: N/A;   . LAPAROSCOPY, DIAGNOSTIC N/A 09/01/2018    Procedure: LAPAROSCOPY, DIAGNOSTIC;  Surgeon: Champ Mungo, MD;  Location: [facility];  Service: General;  Laterality: N/A;   . OVARY SURGERY Right >25 yrs   . SPINE SURGERY  11/2013    cervical HNP x 2, Dr. Bufford Buttner       Social History:  Social History     Socioeconomic History   . Marital status: Single     Spouse name: Not on file   . Number of children: 2   . Years of education: Not on file   . Highest education level: Not on file   Occupational History   . Occupation: Acupuncturist: [employer]    Social Needs   . Financial resource strain: Not on file   . Food insecurity:     Worry: Not on file     Inability: Not on file   . Transportation needs:     Medical: Not on file      Non-medical: Not on file   Tobacco Use   . Smoking status: Never Smoker   . Smokeless tobacco: Never Used   Substance and Sexual Activity   . Alcohol use: No   . Drug use: No   . Sexual activity: Never   Lifestyle   . Physical activity:     Days per week: Not on file     Minutes per session: Not on file   . Stress: Not on file   Relationships   . Social connections:     Talks on phone: Not on file     Gets together: Not on file     Attends religious service: Not on file     Active member of club or organization: Not on file     Attends meetings of clubs or organizations: Not on file     Relationship status: Not on file   . Intimate partner violence:     Fear of current or ex partner: Not on file     Emotionally abused: Not on file     Physically abused: Not on file     Forced sexual activity: Not on file   Other Topics Concern   . Dietary supplements / vitamins Yes   . Anesthesia problems No   . Blood thinners No   . Pregnant Not Asked   . Future Children Not Asked   . Number of Pregnancies? Not Asked   . Number of children Not Asked   . Miscarriages / Abortions? Not Asked   . Eats large amounts No   . Excessive Sweets No   . Skips meals No   . Eats excessive starches Yes   . Snacks or grazes No   . Emotional eater Yes   . Eats fried food Yes   . Eats fast food Yes   . Diet Center No   . HMR No   . [name] No   . [place] Weight Loss No   . Nutri-System No   . Opti-Fast / Medi-Fast No   . Overeaters Anonymous No   . [facility] No   . TOPS No   . Weight Watchers No   . Atkins No   . Binging / Purging No   . Body for Life No   .  Cabbage Soup No   . Calorie Counting No   . Fasting No   . [name] No   . Health Spa No   . Herbal Life No   . High Protein No   . Low Carb No   . Low Fat No   . [facility] Diet No   . [place] Diet No   . Margie Billet Diet No   . [place] Diet No   . Slim Fast No   . [place] No   . Sugar Busters No   . Vomiting No   . Zone Diet No   . Stationary cycle or  treadmill No   . Gym/fitness Classes No   . Home exercise/video No   . Swimming No   . Team sports No   . Weight training No   . Walking or running No   . Hospitalization No   . Hypnosis No   . Physical therapy No   . Psychological therapy No   . Residential program No   . Acutrim No   . Amphetamines No   . Anorex No   . Byetta No   . Dexatrim No   . Didrex No   . Fastin No   . Fen - Phen No   . Ionamin / Adipex No   . Mazanor No   . Meridia No   . Obalan No   . Phendiet No   . Phentrol No   . Phenteramine Yes   . Plegine No   . Pondimin No   . Qsymia No   . Prozac No   . Redux No   . Sanorex No   . Tenuate No   . Tepanole No   . Wechless No   . Wellbutrin No   . Xenical (Orlistat, Alli) No   . Other Med No   . No impairment Yes     Comment: Chronic Bilat Knee Pain   . Walks with cane/crutch Yes   . Requires a wheelchair No   . Bedridden No   . Are you currently being treated for depression? No   . Do you snore? Yes   . Are you receiving any medical or psychological services? No   . Do you ever wake up at night gasping for breath? Not Asked   . Do you have or have you been treated for an eating disorder? No   . Anyone ever told you that you stop breathing while asleep? Not Asked   . Do you exercise regularly? No   . Have you or family member ever have trouble with anesthesia? No   Social History Narrative   . Not on file       Family History:  Family History   Problem Relation Age of Onset   . Cancer Mother 71        breast cancer   . Breast cancer Mother    . Heart disease Father    . Malignant hyperthermia Neg Hx    . Pseudochol deficiency Neg Hx        Outpatient Medication:  Discharge Medication List as of 09/01/2018  1:32 AM      CONTINUE these medications which have NOT CHANGED    Details   albuterol (PROVENTIL HFA;VENTOLIN HFA) 108 (90 BASE) MCG/ACT inhaler Inhale into the lungs every 4 (four) hours as needed.   , Starting 01/26/2012, Until Discontinued, Historical Med      amLODIPine (NORVASC) 5 MG tablet  Take 0.5 tablets (2.5 mg total) by mouth daily, Starting Tue 08/09/2018, Normal      aspirin EC 81 MG EC tablet Take 1 tablet (81 mg total) by mouth daily., Starting Tue 01/25/2018, Normal      B Complex Vitamins (VITAMIN-B COMPLEX) Tab Take  by Mouth every day, Historical Med      cholecalciferol (VITAMIN D-1000 MAX ST) 1000 units tablet Take 1,000 Units by Mouth Once a Day at lunch time, Historical Med      diazePAM (VALIUM) 2 MG tablet Take 1 tablet (2 mg total) by mouth every 8 (eight) hours as needed for Anxiety., Starting Tue 07/13/2017, Print      fluticasone (FLONASE) 50 MCG/ACT nasal spray 2 sprays by Nasal route daily., Starting 01/02/2014, Until Discontinued, Normal      hyoscyamine (ANASPAZ,LEVSIN) 0.125 MG tablet Take 1 tablet (0.125 mg total) by mouth every 4 (four) hours as needed for Cramping, Starting Sat 08/27/2018, Normal      Multiple Vitamin (MULTIVITAMIN) capsule Take 1 capsule by mouth daily, Historical Med      nystatin (NYSTOP) powder Apply to affected area 3 times daily, Normal      pantoprazole (PROTONIX) 40 MG tablet TAKE 1 TABLET(40 MG) BY MOUTH DAILY, Normal      spironolactone (ALDACTONE) 25 MG tablet Take 1 tablet (25 mg total) by mouth daily, Starting Mon 07/11/2018, Until Tue 07/11/2019, Normal      valACYclovir HCL (VALTREX) 500 MG tablet TAKE 1 TABLET(500 MG) BY MOUTH DAILY, Normal      vitamin B-12 (CYANOCOBALAMIN) 100 MCG tablet Take 50 mcg by mouth every other day, Historical Med               REVIEW OF SYSTEMS   Review of Systems   Constitutional: Negative for chills and fever.   Respiratory: Negative for cough and shortness of breath.    Cardiovascular: Negative for chest pain and leg swelling.   Gastrointestinal: Positive for abdominal pain and nausea. Negative for constipation, diarrhea and vomiting.   Genitourinary: Negative for dysuria and hematuria.   Musculoskeletal: Negative for back pain and neck pain.   All other systems reviewed and are negative.      PHYSICAL EXAM     ED  Triage Vitals [08/31/18 1724]   Enc Vitals Group      BP 162/88      Heart Rate 82      Resp Rate 20      Temp 97.9 F (36.6 C)      Temp Source Oral      SpO2 100 %      Weight 61.2 kg      Height 1.549 m      Head Circumference       Peak Flow       Pain Score 8      Pain Loc       Pain Edu?       Excl. in GC?    Nursing note and vitals reviewed.  Constitutional:  Well developed, well nourished.  Awake & alert.    Head:  Atraumatic.  Normocephalic.    ENT:  Mucous membranes are moist and intact.  Patent airway.  Neck:  Supple.  No JVD.   Cardiovascular:  Regular rate.  Regular rhythm.   Pulmonary/Chest:  No evidence of respiratory distress.  Clear to auscultation bilaterally.  No wheezing, rales or rhonchi.   Abdominal:  Soft and non-distended.  There is no tenderness.  No  rebound, guarding, or rigidity.  No bruit. No masses palpable  Back:  No CVA tenderness.   Extremities:  No edema.   No cyanosis.  No clubbing.  2+ DP/PT/radial pulses b/l.  Skin:  Skin is warm and dry.  No diaphoresis.    Neurological:  Alert, awake, and appropriate.  Normal speech.  Moves all extremities.  Normal gate.  Psychiatric:  Good eye contact.  Normal interaction, affect, and behavior  '  MEDICAL DECISION MAKING     DISCUSSION    CT abdomen pelvis rule out bowel obstruction.  Patient signed out to Dr. Yehuda Mao pending CT results.  ED Course as of Sep 02 1148   Wed Aug 31, 2018   2152 Discussed with Dr. Zella Ball who recommended patient be managed by [facility] Bariatric surgeon group who performed the surgery, Dr. Jeanell Sparrow    [RM]   2153 Discussed with Dr. Sammuel Cooper, who is agreed to accept this patient to the fifth floor at [facility] under the bariatric service with excepting physician Dr. Linton Flemings.    [RM]      ED Course User Index  [RM] Ronnie Derby, MD         The patient is NOT septic.  All labs and vital signs from the current visit have been   reviewed and any abnormality that is present is not due to sepsis.    Vital Signs:  Reviewed the patient?s vital signs.   Nursing Notes: Reviewed and utilized available nursing notes.  Medical Records Reviewed: Reviewed available past medical records.  Counseling: The emergency provider has spoken with the patient and discussed today?s findings, in addition to providing specific details for the plan of care.  Questions are answered and there is agreement with the plan.      CARDIAC STUDIES    The following cardiac studies were independently interpreted by the Emergency Medicine Physician.  For full cardiac study results please see chart.    Monitor Strip  Interpreted by ED Physician  Rate: 75  Rhythm: NSR   ST Changes: none    EKG Interpretation:  Signed and interpreted by ED Provider   Time Interpreted: 1738  Rate: 80  Rhythm: NSR  Axis: normal  Intervals: normal  Blocks: none  ST segments: no acute changes  Interpretation: Nonspecific  EKG      RADIOLOGY IMAGING STUDIES      Chest AP Portable   Final Result      Enteric catheter tip in distal stomach.   No other significant changes.      Adaline Sill, MD    08/31/2018 11:50 PM      Chest AP Portable   Final Result    Cardio mediastinal silhouette is within normal limits. No   consolidation or pleural effusion.      There is a gastric tube, tip appears to be at the GE junction. This   should be advanced      Lorinda Creed, MD    08/31/2018 10:39 PM      CT Abd/Pelvis with IV and PO Contrast   Final Result      Small bowel obstruction.      Lorinda Creed, MD    08/31/2018 8:47 PM            PULSE OXIMETRY    Oxygen Saturation by Pulse Oximetry: 100%  Interventions: none  Interpretation:  normal    EMERGENCY DEPT. MEDICATIONS      ED Medication Orders (  From admission, onward)    Start Ordered     Status Ordering Provider    09/01/18 0106 09/01/18 0105  HYDROmorphone (DILAUDID) injection 1 mg  Once     Route: Intravenous  Ordered Dose: 1 mg     Last MAR action:  Given MELLER, RYAN A    08/31/18 2247 08/31/18 2246  potassium chloride 10 mEq in 100 mL  IVPB (premix)  Once     Route: Intravenous  Ordered Dose: 10 mEq     Last MAR action:  Stopped MELLER, RYAN A    08/31/18 2129 08/31/18 2129  metoclopramide (REGLAN) injection 10 mg  Once     Route: Intravenous  Ordered Dose: 10 mg     Last MAR action:  Given MELLER, RYAN A    08/31/18 2123 08/31/18 2122  morphine injection 4 mg  Once     Route: Intravenous  Ordered Dose: 4 mg     Last MAR action:  Given MELLER, RYAN A    08/31/18 2034 08/31/18 2034  iohexol (OMNIPAQUE) 350 MG/ML injection 97 mL  IMG once as needed     Route: Intravenous  Ordered Dose: 97 mL     Last MAR action:  Imaging Agent Given Bethann Berkshire D    08/31/18 1925 08/31/18 1924  morphine injection 4 mg  Once     Route: Intravenous  Ordered Dose: 4 mg     Last MAR action:  Given Gitel Beste D    08/31/18 1732 08/31/18 1731  sodium chloride 0.9 % bolus 1,000 mL  Once     Route: Intravenous  Ordered Dose: 1,000 mL     Last MAR action:  Stopped Emersyn Wyss D    08/31/18 1732 08/31/18 1731  ondansetron (ZOFRAN) injection 4 mg  Once     Route: Intravenous  Ordered Dose: 4 mg     Last MAR action:  Given Lyvonne Cassell D    08/31/18 1732 08/31/18 1731  famotidine (PEPCID) injection 20 mg  Once     Route: Intravenous  Ordered Dose: 20 mg     Last MAR action:  Given Elisavet Buehrer D    08/31/18 1732 08/31/18 1731  morphine injection 4 mg  Once     Route: Intravenous  Ordered Dose: 4 mg     Last MAR action:  Given Aunisty Reali D          LABORATORY RESULTS    Ordered and independently interpreted AVAILABLE laboratory tests. Please see results section in chart for full details.  Results for orders placed or performed during the hospital encounter of 08/31/18   CBC with differential   Result Value Ref Range    WBC 5.60 3.10 - 9.50 x10 3/uL    Hgb 12.6 11.4 - 14.8 g/dL    Hematocrit 16.1 09.6 - 43.7 %    Platelets 259 142 - 346 x10 3/uL    RBC 4.53 3.90 - 5.10 x10 6/uL    MCV 86.8 78.0 - 96.0 fL    MCH 27.8 25.1 - 33.5 pg    MCHC 32.1 31.5 -  35.8 g/dL    RDW 15 11 - 15 %    MPV 9.7 8.9 - 12.5 fL    Neutrophils 63.1 None %    Lymphocytes Automated 25.4 None %    Monocytes 9.6 None %    Eosinophils Automated 1.3 None %    Basophils Automated 0.4 None %    Immature Granulocyte 0.2 None %  Nucleated RBC 0.0 0.0 - 0.0 /100 WBC    Neutrophils Absolute 3.54 1.10 - 6.33 x10 3/uL    Abs Lymph Automated 1.42 0.42 - 3.22 x10 3/uL    Abs Mono Automated 0.54 0.21 - 0.85 x10 3/uL    Abs Eos Automated 0.07 0.00 - 0.44 x10 3/uL    Absolute Baso Automated 0.02 0.00 - 0.08 x10 3/uL    Absolute Immature Granulocyte 0.01 0.00 - 0.07 x10 3/uL    Absolute NRBC 0.00 0.00 - 0.00 x10 3/uL   PT/APTT   Result Value Ref Range    PT 13.6 12.6 - 15.0 sec    PT INR 1.1 0.9 - 1.1    PTT 27 23 - 37 sec   Comprehensive metabolic panel   Result Value Ref Range    Glucose 75 70 - 100 mg/dL    BUN 16.1 7.0 - 09.6 mg/dL    Creatinine 0.7 0.6 - 1.0 mg/dL    Sodium 045 409 - 811 mEq/L    Potassium 3.2 (L) 3.5 - 5.1 mEq/L    Chloride 109 100 - 111 mEq/L    CO2 22 22 - 29 mEq/L    Calcium 10.7 (H) 8.5 - 10.5 mg/dL    Protein, Total 7.6 6.0 - 8.3 g/dL    Albumin 4.4 3.5 - 5.0 g/dL    AST (SGOT) 20 5 - 34 U/L    ALT 13 0 - 55 U/L    Alkaline Phosphatase 103 37 - 106 U/L    Bilirubin, Total 0.4 0.2 - 1.2 mg/dL    Globulin 3.2 2.0 - 3.6 g/dL    Albumin/Globulin Ratio 1.4 0.9 - 2.2    Anion Gap 14.0 5.0 - 15.0   Troponin I   Result Value Ref Range    Troponin I <0.01 0.00 - 0.05 ng/mL   Lipase   Result Value Ref Range    Lipase 11 8 - 78 U/L   Urinalysis Reflex to Microscopic Exam- Reflex to Culture   Result Value Ref Range    Urine Type Urine, Clean Ca     Color, UA Yellow Colorless - Yellow    Clarity, UA Clear Clear - Hazy    Specific Gravity UA 1.005 1.001 - 1.035    Urine pH 6.0 5.0 - 8.0    Leukocyte Esterase, UA NEGATIVE Negative    Nitrite, UA NEGATIVE Negative    Protein, UR NEGATIVE Negative    Glucose, UA NEGATIVE Negative    Ketones UA NEGATIVE Negative    Urobilinogen, UA <2.0 0.2 -  2.0 mg/dL    Bilirubin, UA NEGATIVE Negative    Blood, UA NEGATIVE Negative    WBC, UA None Seen 0 - 5 /hpf    Urine Amorphous Occasional Occasional /hpf   GFR   Result Value Ref Range    EGFR >60.0    ECG 12 lead   Result Value Ref Range    Ventricular Rate 80 BPM    Atrial Rate 80 BPM    P-R Interval 152 ms    QRS Duration 82 ms    Q-T Interval 376 ms    QTC Calculation (Bezet) 433 ms    P Axis 49 degrees    R Axis -6 degrees    T Axis 38 degrees   ECG 12 lead   Result Value Ref Range    Ventricular Rate 80 BPM    Atrial Rate 80 BPM    P-R Interval 152 ms  QRS Duration 82 ms    Q-T Interval 376 ms    QTC Calculation (Bezet) 433 ms    P Axis 49 degrees    R Axis -6 degrees    T Axis 38 degrees       CRITICAL CARE/PROCEDURES    Procedures    DIAGNOSIS      Diagnosis:  Final diagnoses:   Small bowel obstruction       Disposition:  ED Disposition     ED Disposition Condition Date/Time Comment    Transfer to Another Facility  Wed Aug 31, 2018  9:51 PM Arli ARNECIA ECTOR should be transferred out to [facility]. Accepting Physician Dr. Linton Flemings.           Prescriptions:  Discharge Medication List as of 09/01/2018  1:32 AM      CONTINUE these medications which have NOT CHANGED    Details   albuterol (PROVENTIL HFA;VENTOLIN HFA) 108 (90 BASE) MCG/ACT inhaler Inhale into the lungs every 4 (four) hours as needed.   , Starting 01/26/2012, Until Discontinued, Historical Med      amLODIPine (NORVASC) 5 MG tablet Take 0.5 tablets (2.5 mg total) by mouth daily, Starting Tue 08/09/2018, Normal      aspirin EC 81 MG EC tablet Take 1 tablet (81 mg total) by mouth daily., Starting Tue 01/25/2018, Normal      B Complex Vitamins (VITAMIN-B COMPLEX) Tab Take  by Mouth every day, Historical Med      cholecalciferol (VITAMIN D-1000 MAX ST) 1000 units tablet Take 1,000 Units by Mouth Once a Day at lunch time, Historical Med      diazePAM (VALIUM) 2 MG tablet Take 1 tablet (2 mg total) by mouth every 8 (eight) hours as needed for Anxiety.,  Starting Tue 07/13/2017, Print      fluticasone (FLONASE) 50 MCG/ACT nasal spray 2 sprays by Nasal route daily., Starting 01/02/2014, Until Discontinued, Normal      hyoscyamine (ANASPAZ,LEVSIN) 0.125 MG tablet Take 1 tablet (0.125 mg total) by mouth every 4 (four) hours as needed for Cramping, Starting Sat 08/27/2018, Normal      Multiple Vitamin (MULTIVITAMIN) capsule Take 1 capsule by mouth daily, Historical Med      nystatin (NYSTOP) powder Apply to affected area 3 times daily, Normal      pantoprazole (PROTONIX) 40 MG tablet TAKE 1 TABLET(40 MG) BY MOUTH DAILY, Normal      spironolactone (ALDACTONE) 25 MG tablet Take 1 tablet (25 mg total) by mouth daily, Starting Mon 07/11/2018, Until Tue 07/11/2019, Normal      valACYclovir HCL (VALTREX) 500 MG tablet TAKE 1 TABLET(500 MG) BY MOUTH DAILY, Normal      vitamin B-12 (CYANOCOBALAMIN) 100 MCG tablet Take 50 mcg by mouth every other day, Historical Med             This note was generated by the Epic EMR system/ Dragon speech recognition and may contain inherent errors or omissions not intended by the user. Grammatical errors, random word insertions, deletions and pronoun errors are occasional consequences of this technology due to software limitations. Not all errors are caught or corrected. If there are questions or concerns about the content of this note or information contained within the body of this dictation they should be addressed directly with the author for clarification.     Shela Nevin, MD  09/02/18 1150

## 2018-08-31 NOTE — ED Triage Notes (Signed)
Reports bilateral upper abdominal pain that started 3 hours ago, Denies nausea, vomiting or diarrhea.

## 2018-08-31 NOTE — ED Notes (Signed)
Ptoisa aware of NPO

## 2018-08-31 NOTE — EDIE (Signed)
COLLECTIVE?NOTIFICATION?08/31/2018 17:04?Amber [name], Amber [name]?MRN: [id number]    Criteria Met      5 ED Visits in 12 Months    Security and Safety  No recent Security Events currently on file    ED Care Guidelines  There are currently no ED Care Guidelines for this patient. Please check your facility's medical records system.          E.D. Visit Count (12 mo.)  Facility Visits   [facility] 2   [place] [facility]: [facility] [place] 1   [place] [facility] 1   [place] [facility]: [facility] 5   Total 9   Note: Visits indicate total known visits.      Recent Emergency Department Visit Summary  Date Facility [facility] Type Diagnoses or Chief Complaint   Aug 31, 2018 [place] [facility]: [facility]. [place] [facility]      abdominal pain      Jul 26, 2018 [place] [facility]: [facility]. [place] [facility]      Pain with swallowing      Cough      Otalgia      Sore Throat      Flu due to oth ident influenza virus w oth resp manifest      Jul 07, 2018 [facility] [place] [name] [facility]      RS ANGINA      CHEST PAIN ADULT      Chest pain, unspecified      Jun 16, 2018 [place] [facility]: [facility] [employer] [place] [facility]      Chest Pain      Chest pain, unspecified      Dizziness and giddiness      Essential (primary) hypertension      Apr 07, 2018 [facility] [place] [name] [facility]      RS CHEST PAIN      Other chest pain      CHEST PAIN ADULT      Mar 08, 2018 [place] [facility] [place] [facility]      abdominal pain,dizzy, nausea      Abdominal Pain      Dizziness      Epigastric pain      Jan 24, 2018 [place] [facility]: [facility] [employer] [employer] [place] [facility]      Rapid Heart Rate      Chest Pain      Anesthesia of skin      Chest pain, unspecified      Palpitations      Nov 21, 2017 [place] [facility]: [facility] [employer] [place] [facility]      TRIAGE - Pain on Left Knee       Knee Pain      Pain in left knee      Nov 04, 2017 [place] [facility]: [facility]. [place] [facility]      Hypertention,dizziness      Chest Pain      Dizziness      Other fatigue      Bariatric surgery status      Essential (primary) hypertension      Postgastric surgery syndromes          Recent Inpatient Visit Summary  Date Facility [facility] Type Diagnoses or Chief Complaint   Feb 08, 2018 [facility] [place] [name] [facility]  DIZZINESS      1. Dizziness and giddiness      2. Transient cerebral ischemic attack, unspecified      4. Essential (primary) hypertension      5. Obstructive sleep apnea (adult) (pediatric)      6. Generalized anxiety disorder      7. Hyperlipidemia, unspecified      8. Gastro-esophageal reflux disease without esophagitis      9. Bilateral primary osteoarthritis of knee      10. Hypokalemia      Jan 24, 2018 [place] [facility]      Anesthesia of skin      Palpitations      Chest pain, unspecified      Headache      Paresthesia of skin      Other chest pain          Care Team  There is not a care team on record at this time.   Collective Portal  This patient has registered at the [facility] Emergency Room: [facility]   For more information visit: [email or web address]     PLEASE NOTE:    1.   Any care recommendations and other clinical information are provided as guidelines or for historical purposes only, and providers should exercise their own clinical judgment when providing care.    2.   You may only use this information for purposes of treatment, payment or health care operations activities, and subject to the limitations of applicable Collective Policies.    3.   You should consult directly with the organization that provided a care guideline or other clinical history with any questions about additional information or  accuracy or completeness of information provided.    ? 2019 [place] - [email or web address]

## 2018-09-01 ENCOUNTER — Encounter: Admission: AD | Disposition: A | Discharge status: Home / Self Care | Payer: Self-pay | Attending: Surgery

## 2018-09-01 ENCOUNTER — Inpatient Hospital Stay: Payer: Medicare Other | Admitting: Certified Registered"

## 2018-09-01 ENCOUNTER — Inpatient Hospital Stay
Admission: AD | Admit: 2018-09-01 | Discharge: 2018-09-02 | DRG: 337 | Disposition: A | Discharge status: Home / Self Care | Payer: Medicare Other | Source: Other Acute Inpatient Hospital | Attending: Surgery | Admitting: Surgery

## 2018-09-01 ENCOUNTER — Encounter: Payer: Self-pay | Admitting: Certified Registered"

## 2018-09-01 DIAGNOSIS — Z9884 Bariatric surgery status: Secondary | ICD-10-CM

## 2018-09-01 DIAGNOSIS — K219 Gastro-esophageal reflux disease without esophagitis: Secondary | ICD-10-CM | POA: Diagnosis present

## 2018-09-01 DIAGNOSIS — E785 Hyperlipidemia, unspecified: Secondary | ICD-10-CM | POA: Diagnosis present

## 2018-09-01 DIAGNOSIS — J45909 Unspecified asthma, uncomplicated: Secondary | ICD-10-CM | POA: Diagnosis present

## 2018-09-01 DIAGNOSIS — Z7982 Long term (current) use of aspirin: Secondary | ICD-10-CM

## 2018-09-01 DIAGNOSIS — Z8673 Personal history of transient ischemic attack (TIA), and cerebral infarction without residual deficits: Secondary | ICD-10-CM

## 2018-09-01 DIAGNOSIS — Z79899 Other long term (current) drug therapy: Secondary | ICD-10-CM

## 2018-09-01 DIAGNOSIS — K56609 Unspecified intestinal obstruction, unspecified as to partial versus complete obstruction: Secondary | ICD-10-CM

## 2018-09-01 DIAGNOSIS — K565 Intestinal adhesions [bands], unspecified as to partial versus complete obstruction: Principal | ICD-10-CM | POA: Diagnosis present

## 2018-09-01 DIAGNOSIS — K449 Diaphragmatic hernia without obstruction or gangrene: Secondary | ICD-10-CM | POA: Diagnosis present

## 2018-09-01 DIAGNOSIS — M170 Bilateral primary osteoarthritis of knee: Secondary | ICD-10-CM | POA: Diagnosis present

## 2018-09-01 DIAGNOSIS — I10 Essential (primary) hypertension: Secondary | ICD-10-CM | POA: Diagnosis present

## 2018-09-01 DIAGNOSIS — K660 Peritoneal adhesions (postprocedural) (postinfection): Secondary | ICD-10-CM

## 2018-09-01 HISTORY — PX: LAPAROSCOPY, DIAGNOSTIC: SHX4584

## 2018-09-01 HISTORY — PX: LAPAROSCOPIC, LYSIS, ADHESIONS: SHX4534

## 2018-09-01 LAB — POTASSIUM WHOLE BLOOD: Whole Blood Potassium: 4.1 mEq/L (ref 3.6–5.0)

## 2018-09-01 SURGERY — LAPAROSCOPY, DIAGNOSTIC
Anesthesia: Anesthesia General | Site: Abdomen | Wound class: Clean Contaminated

## 2018-09-01 MED ORDER — SODIUM CHLORIDE 0.9 % IR SOLN
Status: DC | PRN
Start: 2018-09-01 — End: 2018-09-01
  Administered 2018-09-01: 1000 mL

## 2018-09-01 MED ORDER — MEPERIDINE HCL 25 MG/ML IJ SOLN
25.0000 mg | INTRAMUSCULAR | Status: DC | PRN
Start: 2018-09-01 — End: 2018-09-01

## 2018-09-01 MED ORDER — MIDAZOLAM HCL 2 MG/2ML IJ SOLN
INTRAMUSCULAR | Status: DC | PRN
Start: 2018-09-01 — End: 2018-09-01
  Administered 2018-09-01: 2 mg via INTRAVENOUS

## 2018-09-01 MED ORDER — MORPHINE SULFATE 4 MG/ML IJ/IV SOLN (WRAP)
4.0000 mg | Status: DC | PRN
Start: 2018-09-01 — End: 2018-09-02

## 2018-09-01 MED ORDER — KETOROLAC TROMETHAMINE 15 MG/ML IJ SOLN
15.00 mg | Freq: Four times a day (QID) | INTRAMUSCULAR | Status: DC | PRN
Start: 2018-09-01 — End: 2018-09-02
  Administered 2018-09-01 – 2018-09-02 (×3): 15 mg via INTRAVENOUS
  Filled 2018-09-01 (×3): qty 1

## 2018-09-01 MED ORDER — EPINEPHRINE HCL 1 MG/ML IJ SOLN (WRAP)
Status: AC
Start: 2018-09-01 — End: ?
  Filled 2018-09-01: qty 1

## 2018-09-01 MED ORDER — FENTANYL CITRATE (PF) 50 MCG/ML IJ SOLN (WRAP)
INTRAMUSCULAR | Status: DC | PRN
Start: 2018-09-01 — End: 2018-09-01
  Administered 2018-09-01 (×2): 50 ug via INTRAVENOUS

## 2018-09-01 MED ORDER — HYDROMORPHONE HCL 1 MG/ML IJ SOLN
1.00 mg | Freq: Once | INTRAMUSCULAR | Status: AC
Start: 2018-09-01 — End: 2018-09-01
  Administered 2018-09-01: 01:00:00 1 mg via INTRAVENOUS
  Filled 2018-09-01: qty 1

## 2018-09-01 MED ORDER — DEXAMETHASONE SODIUM PHOSPHATE 4 MG/ML IJ SOLN (WRAP)
INTRAMUSCULAR | Status: DC | PRN
Start: 2018-09-01 — End: 2018-09-01
  Administered 2018-09-01: 4 mg via INTRAVENOUS

## 2018-09-01 MED ORDER — ROCURONIUM BROMIDE 10 MG/ML IV SOLN (WRAP)
INTRAVENOUS | Status: DC | PRN
Start: 2018-09-01 — End: 2018-09-01
  Administered 2018-09-01: 5 mg via INTRAVENOUS
  Administered 2018-09-01: 25 mg via INTRAVENOUS

## 2018-09-01 MED ORDER — LACTATED RINGERS IV SOLN
INTRAVENOUS | Status: DC
Start: 2018-09-01 — End: 2018-09-01

## 2018-09-01 MED ORDER — GLYCOPYRROLATE 1 MG/5ML IJ SOLN
INTRAMUSCULAR | Status: AC
Start: 2018-09-01 — End: ?
  Filled 2018-09-01: qty 5

## 2018-09-01 MED ORDER — LACTATED RINGERS IV SOLN
INTRAVENOUS | Status: DC | PRN
Start: 2018-09-01 — End: 2018-09-01

## 2018-09-01 MED ORDER — SUCCINYLCHOLINE CHLORIDE 20 MG/ML IJ SOLN
INTRAMUSCULAR | Status: DC | PRN
Start: 2018-09-01 — End: 2018-09-01
  Administered 2018-09-01: 100 mg via INTRAVENOUS

## 2018-09-01 MED ORDER — CLINDAMYCIN PHOSPHATE IN D5W 600 MG/50ML IV SOLN
600.00 mg | INTRAVENOUS | Status: AC
Start: 2018-09-01 — End: 2018-09-01
  Administered 2018-09-01: 15:00:00 600 mg via INTRAVENOUS
  Filled 2018-09-01: qty 50

## 2018-09-01 MED ORDER — BUPIVACAINE-EPINEPHRINE (PF) 0.5% -1:200000 IJ SOLN
INTRAMUSCULAR | Status: DC | PRN
Start: 2018-09-01 — End: 2018-09-01
  Administered 2018-09-01: 30 mL via INTRAMUSCULAR

## 2018-09-01 MED ORDER — PROPOFOL 10 MG/ML IV EMUL (WRAP)
INTRAVENOUS | Status: DC | PRN
Start: 2018-09-01 — End: 2018-09-01
  Administered 2018-09-01: 200 mg via INTRAVENOUS

## 2018-09-01 MED ORDER — NALOXONE HCL 0.4 MG/ML IJ SOLN (WRAP)
0.2000 mg | INTRAMUSCULAR | Status: DC | PRN
Start: 2018-09-01 — End: 2018-09-02

## 2018-09-01 MED ORDER — POTASSIUM CHLORIDE IN NACL 20-0.9 MEQ/L-% IV SOLN
INTRAVENOUS | Status: DC
Start: 2018-09-01 — End: 2018-09-02
  Administered 2018-09-02 (×2): 125 mL/h via INTRAVENOUS

## 2018-09-01 MED ORDER — HYDROMORPHONE HCL 0.5 MG/0.5 ML IJ SOLN
0.5000 mg | INTRAMUSCULAR | Status: DC | PRN
Start: 2018-09-01 — End: 2018-09-01

## 2018-09-01 MED ORDER — SODIUM CHLORIDE 0.9 % IV SOLN
INTRAVENOUS | Status: DC
Start: 2018-09-01 — End: 2018-09-01

## 2018-09-01 MED ORDER — DIAZEPAM 2 MG PO TABS
2.0000 mg | ORAL_TABLET | Freq: Three times a day (TID) | ORAL | Status: DC | PRN
Start: 2018-09-01 — End: 2018-09-02

## 2018-09-01 MED ORDER — LABETALOL HCL 5 MG/ML IV SOLN (WRAP)
INTRAVENOUS | Status: DC | PRN
Start: 2018-09-01 — End: 2018-09-01
  Administered 2018-09-01 (×2): 5 mg via INTRAVENOUS
  Administered 2018-09-01: 10 mg via INTRAVENOUS

## 2018-09-01 MED ORDER — SPIRONOLACTONE 25 MG PO TABS
25.0000 mg | ORAL_TABLET | Freq: Every day | ORAL | Status: DC
Start: 2018-09-01 — End: 2018-09-01

## 2018-09-01 MED ORDER — FENTANYL CITRATE (PF) 50 MCG/ML IJ SOLN (WRAP)
25.0000 ug | INTRAMUSCULAR | Status: AC | PRN
Start: 2018-09-01 — End: 2018-09-01
  Administered 2018-09-01 (×4): 25 ug via INTRAVENOUS
  Filled 2018-09-01: qty 2

## 2018-09-01 MED ORDER — ENOXAPARIN SODIUM 40 MG/0.4ML SC SOLN
40.00 mg | Freq: Every day | SUBCUTANEOUS | Status: DC
Start: 2018-09-01 — End: 2018-09-01

## 2018-09-01 MED ORDER — KETOROLAC TROMETHAMINE 30 MG/ML IJ SOLN
INTRAMUSCULAR | Status: DC | PRN
Start: 2018-09-01 — End: 2018-09-01
  Administered 2018-09-01: 30 mg via INTRAVENOUS

## 2018-09-01 MED ORDER — NEOSTIGMINE METHYLSULFATE 1 MG/ML IJ/IV SOLN (WRAP)
Status: AC
Start: 2018-09-01 — End: ?
  Filled 2018-09-01: qty 10

## 2018-09-01 MED ORDER — BUPIVACAINE HCL (PF) 0.5 % IJ SOLN
INTRAMUSCULAR | Status: AC
Start: 2018-09-01 — End: ?
  Filled 2018-09-01: qty 30

## 2018-09-01 MED ORDER — ONDANSETRON HCL 4 MG/2ML IJ SOLN
INTRAMUSCULAR | Status: DC | PRN
Start: 2018-09-01 — End: 2018-09-01
  Administered 2018-09-01: 4 mg via INTRAVENOUS

## 2018-09-01 MED ORDER — SUCCINYLCHOLINE CHLORIDE 20 MG/ML IJ SOLN
INTRAMUSCULAR | Status: AC
Start: 2018-09-01 — End: ?
  Filled 2018-09-01: qty 10

## 2018-09-01 MED ORDER — FENTANYL CITRATE (PF) 50 MCG/ML IJ SOLN (WRAP)
INTRAMUSCULAR | Status: AC
Start: 2018-09-01 — End: ?
  Filled 2018-09-01: qty 2

## 2018-09-01 MED ORDER — ONDANSETRON 4 MG PO TBDP
4.00 mg | ORAL_TABLET | Freq: Four times a day (QID) | ORAL | Status: DC | PRN
Start: 2018-09-01 — End: 2018-09-02

## 2018-09-01 MED ORDER — ONDANSETRON HCL 4 MG/2ML IJ SOLN
4.00 mg | Freq: Four times a day (QID) | INTRAMUSCULAR | Status: DC | PRN
Start: 2018-09-01 — End: 2018-09-02

## 2018-09-01 MED ORDER — PROCHLORPERAZINE EDISYLATE 10 MG/2ML IJ SOLN
10.00 mg | INTRAMUSCULAR | Status: DC | PRN
Start: 2018-09-01 — End: 2018-09-02

## 2018-09-01 MED ORDER — ONDANSETRON HCL 4 MG/2ML IJ SOLN
INTRAMUSCULAR | Status: AC
Start: 2018-09-01 — End: ?
  Filled 2018-09-01: qty 2

## 2018-09-01 MED ORDER — ONDANSETRON HCL 4 MG/2ML IJ SOLN
4.0000 mg | Freq: Once | INTRAMUSCULAR | Status: DC | PRN
Start: 2018-09-01 — End: 2018-09-01

## 2018-09-01 MED ORDER — KETOROLAC TROMETHAMINE 30 MG/ML IJ SOLN
INTRAMUSCULAR | Status: AC
Start: 2018-09-01 — End: ?
  Filled 2018-09-01: qty 1

## 2018-09-01 MED ORDER — POTASSIUM CHLORIDE IN NACL 20-0.9 MEQ/L-% IV SOLN
INTRAVENOUS | Status: DC
Start: 2018-09-01 — End: 2018-09-01
  Administered 2018-09-01 (×2): 125 mL/h via INTRAVENOUS

## 2018-09-01 MED ORDER — GLYCOPYRROLATE 0.2 MG/ML IJ SOLN
INTRAMUSCULAR | Status: DC | PRN
Start: 2018-09-01 — End: 2018-09-01
  Administered 2018-09-01: .6 mg via INTRAVENOUS

## 2018-09-01 MED ORDER — DEXAMETHASONE SODIUM PHOSPHATE 4 MG/ML IJ SOLN
INTRAMUSCULAR | Status: AC
Start: 2018-09-01 — End: ?
  Filled 2018-09-01: qty 1

## 2018-09-01 MED ORDER — OXYCODONE HCL 5 MG PO TABS
5.0000 mg | ORAL_TABLET | Freq: Four times a day (QID) | ORAL | Status: DC | PRN
Start: 2018-09-01 — End: 2018-09-02
  Administered 2018-09-02: 5 mg via ORAL
  Filled 2018-09-01: qty 1

## 2018-09-01 MED ORDER — ALBUTEROL SULFATE HFA 108 (90 BASE) MCG/ACT IN AERS
1.00 | INHALATION_SPRAY | RESPIRATORY_TRACT | Status: DC | PRN
Start: 2018-09-01 — End: 2018-09-02
  Filled 2018-09-01: qty 1

## 2018-09-01 MED ORDER — AMLODIPINE BESYLATE 2.5 MG PO TABS
2.5000 mg | ORAL_TABLET | Freq: Every day | ORAL | Status: DC
Start: 2018-09-01 — End: 2018-09-02
  Administered 2018-09-02: 09:00:00 2.5 mg via ORAL
  Filled 2018-09-01: qty 1

## 2018-09-01 MED ORDER — AMMONIA AROMATIC IN INHA
1.0000 | Freq: Once | RESPIRATORY_TRACT | Status: DC | PRN
Start: 2018-09-01 — End: 2018-09-01

## 2018-09-01 MED ORDER — NEOSTIGMINE METHYLSULFATE 1 MG/ML IJ/IV SOLN (WRAP)
Status: DC | PRN
Start: 2018-09-01 — End: 2018-09-01
  Administered 2018-09-01: 1 mg via INTRAVENOUS
  Administered 2018-09-01: 4 mg via INTRAVENOUS

## 2018-09-01 MED ORDER — FAMOTIDINE 10 MG/ML IV SOLN (WRAP)
20.00 mg | Freq: Two times a day (BID) | INTRAVENOUS | Status: DC
Start: 2018-09-01 — End: 2018-09-02
  Administered 2018-09-01 – 2018-09-02 (×3): 20 mg via INTRAVENOUS
  Filled 2018-09-01 (×3): qty 2

## 2018-09-01 MED ORDER — ACETAMINOPHEN 160 MG/5ML PO SOLN
650.0000 mg | ORAL | Status: DC
Start: 2018-09-01 — End: 2018-09-02
  Administered 2018-09-01 – 2018-09-02 (×5): 650 mg via ORAL
  Filled 2018-09-01 (×5): qty 20.3

## 2018-09-01 MED ORDER — ROCURONIUM BROMIDE 50 MG/5ML IV SOLN
INTRAVENOUS | Status: AC
Start: 2018-09-01 — End: ?
  Filled 2018-09-01: qty 5

## 2018-09-01 MED ORDER — BUPIVACAINE-EPINEPHRINE (PF) 0.5% -1:200000 IJ SOLN
INTRAMUSCULAR | Status: AC
Start: 2018-09-01 — End: ?
  Filled 2018-09-01: qty 10

## 2018-09-01 MED ORDER — PROPOFOL 10 MG/ML IV EMUL (WRAP)
INTRAVENOUS | Status: AC
Start: 2018-09-01 — End: ?
  Filled 2018-09-01: qty 20

## 2018-09-01 MED ORDER — MIDAZOLAM HCL 2 MG/2ML IJ SOLN
INTRAMUSCULAR | Status: AC
Start: 2018-09-01 — End: ?
  Filled 2018-09-01: qty 2

## 2018-09-01 MED ORDER — PROMETHAZINE HCL 25 MG/ML IJ SOLN
6.2500 mg | Freq: Once | INTRAMUSCULAR | Status: DC | PRN
Start: 2018-09-01 — End: 2018-09-01

## 2018-09-01 MED ORDER — LIDOCAINE HCL (PF) 1 % IJ SOLN
INTRAMUSCULAR | Status: DC | PRN
Start: 2018-09-01 — End: 2018-09-01
  Administered 2018-09-01: 60 mg via INTRAVENOUS

## 2018-09-01 SURGICAL SUPPLY — 153 items
ADHESIVE SKIN CLOSURE DERMABOND ADVANCED (Skin Closure) ×1 IMPLANT
ADHESIVE SKIN CLOSURE DERMABOND ADVANCED .7 ML LIQUID APPLICATOR (Skin Closure) ×1 IMPLANT
ADHESIVE SKNCLS 2 OCTYL CYNCRLT .7ML (Skin Closure) ×1
APPLCATOR CHLORAPREP 26ML (Prep) ×4 IMPLANT
BLADE SRGCLPR LF STRL PVT ADJ HD DISP (Blade)
BLADE SURGICAL CLIPPER PIVOT ADJUSTABLE (Blade) IMPLANT
BLADE SURGICAL CLIPPER PIVOT ADJUSTABLE HEAD 9661 PURPLE (Blade) IMPLANT
CANNISTER SUCTION 3000CC (Suction) ×2 IMPLANT
CLEANSER 15 ML HIBICLENS ANTIMICROBIAL (Scrub Supplies) ×1 IMPLANT
CLEANSER 15 ML HIBICLENS ANTIMICROBIAL ANTISEPTIC SKIN 4% (Scrub Supplies) ×1 IMPLANT
CLEANSER SKN 4% CHG 15ML HIBI ANMC ANSEP (Scrub Supplies) ×1
CLIP SUT PDS LPR TY VCL LF STRL ABS (Suture)
CLIP SUTURE ABSORBABLE LAPRA-TY VICRYL (Suture) IMPLANT
CLIP SUTURE ABSORBABLE LAPRA-TY VICRYL POLYDIOXANONE (Suture) IMPLANT
CLIPPER SURGICAL PREP VACUUM SUCTION (Tubing) IMPLANT
CLIPPER SURGICAL PREP VACUUM SUCTION PORT ADJUSTABLE STRAP CLIPVAC (Tubing) IMPLANT
DEVICE CLOSURE L9 IN 2-0 GS-21 V-LOC 90 (Instrument) ×3 IMPLANT
DEVICE LIGASURE L-HK RET 44CM (Instrument) ×2 IMPLANT
DEVICE SUT E STCH 10MM 15IN LF STRL (Laparoscopy Supplies)
DEVICE SUTURING L15 IN PUSH BUTTON GRIP (Laparoscopy Supplies) IMPLANT
DEVICE SUTURING L15 IN PUSH BUTTON GRP HNDL 10MM ENDO STTCH ENDOSCOPIC (Laparoscopy Supplies) IMPLANT
DEVICE SUTURING TROCAR SITE SPRING LOAD (Laparoscopy Supplies) IMPLANT
DEVICE SUTURING TROCAR SITE SPRNG LOAD BLUNT STLT 10MM ENDO ENDOSCOPIC (Laparoscopy Supplies) IMPLANT
DISSECTOR CORDLSS ULTRSNC 39CM (Procedure Accessories) IMPLANT
ELECTRODE ADULT PATIENT RETURN L9 FT REM POLYHESIVE ACRYLIC FOAM (Procedure Accessories) ×1 IMPLANT
ELECTRODE ELECTROSURGICAL L HOOK L36 CM (Procedure Accessories) IMPLANT
ELECTRODE ELECTROSURGICAL L HOOK L36 CM CLEANCOAT PTFE COATED (Procedure Accessories) IMPLANT
ELECTRODE ESURG PTFE LHK CLEANCOAT 36CM (Procedure Accessories)
ELECTRODE PATIENT RETURN L9 FT VALLEYLAB (Procedure Accessories) ×1 IMPLANT
ELECTRODE PT RTN RM PHSV ACRL FM C30- LB (Procedure Accessories) ×1
GLOVE SRG 7.5 BGL SRG LTX STRL PF BEAD (Glove) ×1
GLOVE SRG BGL M 8 LTX STRL PF TXTR BEAD (Glove) ×1
GLOVE SRG PLISPRN 7.5 BGL PI INDCTR (Glove) ×1
GLOVE SRG PLISPRN 8 BGL PI INDCTR (Glove) ×1
GLOVE SURGICAL 7 1/2 BIOGEL PI INDICATOR (Glove) ×1 IMPLANT
GLOVE SURGICAL 7 1/2 BIOGEL PI INDICATOR UNDERGLOVE POWDER FREE SMOOTH (Glove) ×1 IMPLANT
GLOVE SURGICAL 7 1/2 BIOGEL SURGEONS (Glove) ×1 IMPLANT
GLOVE SURGICAL 7 1/2 BIOGEL SURGEONS POWDER FREE BEAD CUFF TEXTURE (Glove) ×1 IMPLANT
GLOVE SURGICAL 8 BIOGEL PI INDICATOR (Glove) ×1 IMPLANT
GLOVE SURGICAL 8 BIOGEL PI INDICATOR UNDERGLOVE POWDER FREE SMOOTH (Glove) ×1 IMPLANT
GLOVE SURGICAL 8 POWDER FREE TEXTURE (Glove) ×1 IMPLANT
GLOVE SURGICAL 8 POWDER FREE TEXTURE BEAD CUFF NONPYROGENIC BIOGEL M (Glove) ×1 IMPLANT
GOWN SRG XL SMARTGOWN LF STRL LVL 4 (Gown)
GOWN SURGICAL XL SMARTGOWN LEVEL 4 (Gown) IMPLANT
GOWN SURGICAL XL SMARTGOWN LEVEL 4 BREATHABLE (Gown) IMPLANT
HANDLE LGHT LF STRL ADP LGHT CNTRL + TCH (Other) ×1
HANDLE LIGHT ADAPTIVE LIGHT CONTROL PLUS (Other) ×1 IMPLANT
HANDLE LIGHT ADAPTIVE LIGHT CONTROL PLUS TECHNOLOGY SNAP ON LENS TOUCH (Other) ×1 IMPLANT
HOLDER ENDOSCOPIC INSTRUMENT TUBE SET (Kits) IMPLANT
HOLDER ENDOSCOPIC INSTRUMENT TUBE SET ACTIVATION BUTTON WINGMAN (Kits) IMPLANT
IRRIGATOR SUCTION ERGONOMIC HAND PIECE STRYKEFLOW II (Suction) ×1 IMPLANT
IRRIGATOR SUCTION STRYKEFLOW 2 (Suction) ×1 IMPLANT
KIT CLOSURE PROCEDURE (Suture) IMPLANT
KIT INFECTION CONTROL CUSTOM (Kits) ×2 IMPLANT
KIT INFECTION CONTROL CUSTOM IFOH03 (Kits) ×1 IMPLANT
NEEDLE HPO PP RW PRCSNGL 20GA 1.5IN LF (Needles) ×2 IMPLANT
PACK GEN LAPAROSCOPY FFX (Pack) ×2 IMPLANT
PASSER SUT CONE WECK EFX 9.82IN LF STRL (Procedure Accessories)
PEN SRGMRK 6IN LF STRL RLR LG RSRV REG (Positioning Supplies) ×1
PEN SURGICAL MARKING MEDLINE SKIN RULER (Positioning Supplies) ×1 IMPLANT
PEN SURGICAL MARKING SKIN RULER LARGE RESERVOIR REGULAR TIP LABEL (Positioning Supplies) ×1 IMPLANT
PROTECTOR TISS MED ALEXIS LF FLXB RTRCT (Retractor) ×1
PROTECTOR TISSUE MEDIUM FLEXBLE (Retractor) ×1 IMPLANT
PROTECTOR TISSUE MEDIUM FLEXIBLE (Retractor) ×1
RELOAD STAPLER 2 MM 2.5 MM 3 MM L60 MM (Staplers) ×2 IMPLANT
RELOAD STAPLER 2 MM 2.5 MM 3 MM L60 MM ENDO GIA TITANIUM MEDIUM (Staplers) ×2 IMPLANT
RELOAD STPLR TI 2MM 2.5MM 3MM EGIA 60MM (Staplers) ×2
RELOAD SUT AS FIBROIN SS 0 ES-9 E STCH (Suture)
RELOAD SUTURE ASSISTANT L48 IN 0 ES-9 (Suture) IMPLANT
RELOAD SUTURE ASSISTANT L48 IN 0 ES-9 NONABSORBABLE NONMUTAGENIC ENDO (Suture) IMPLANT
RETRACTOR ALEXIS FLEXIBLE RETRACTION (Retractor) ×1 IMPLANT
RETRACTOR TISS SM ALEXIS LF FLXB RTRCT (Retractor) ×1
RETRACTOR TISSUE MEDIUM FLEXIBLE RETRACTION RING ATRAUMATIC SELF (Retractor) ×1 IMPLANT
RETRACTOR TISSUE SMALL FLEXIBLE RETRACTION RING ATRAUMATIC SELF RETAIN (Retractor) ×1 IMPLANT
SCISSORS L21 CM ENDOPATH 360 D CURVE (Instrument) ×1 IMPLANT
SCISSORS L21 CM ENDOPATH 360 D CURVE RATCHET HANDLE MONOPOLAR CAUTERY (Instrument) ×1 IMPLANT
SCISSORS LAPSCP 360D CRV EPTH 5MM 21CM (Instrument) ×1
SEALER LIGASURE LAP MD JAW (Instrument) IMPLANT
SET TUBE W/BTN F/SCOPE HLDR (Kits)
SLEEVE CMPR MED KN LGTH KDL SCD 21- IN (Sleeve) ×2 IMPLANT
SLEEVE COMPRESSION MEDIUM KNEE LENGTH KENDALL SEQUENTIAL OD21- IN (Sleeve) ×1 IMPLANT
SLEEVE LAPAROSCOPIC L100 MM UNIVERSAL (Procedure Accessories) ×3
SLEEVE LAPSCP UNV EPTH XCL 5MM 100MM (Procedure Accessories) ×3 IMPLANT
SLEEVE LAPSCP UNV EPTH XCL 5MM 100MM LF (Procedure Accessories) ×3
SLEEVE TROCAR L100 MM UNIVERSAL STABILITY OD5 MM ENDOPATH XCEL (Procedure Accessories) ×3 IMPLANT
SOLUTION IRR 0.9% NACL 1000ML LF STRL (Irrigation Solutions) ×1
SOLUTION IRR LR 3L ARTHMTC LF PLS CNTNR (Irrigation Solutions)
SOLUTION IRRIGATION 0.9% SODIUM CHLORIDE (Irrigation Solutions) ×1 IMPLANT
SOLUTION IRRIGATION 0.9% SODIUM CHLORIDE 1000 ML PLASTIC POUR BOTTLE (Irrigation Solutions) ×1 IMPLANT
SOLUTION IRRIGATION LACTATED RINGERS (Irrigation Solutions) IMPLANT
SOLUTION IRRIGATION LACTATED RINGERS 3000 ML PLASTIC CONTAINER (Irrigation Solutions) IMPLANT
SOLUTION IV LACTATED RINGERS 1000 ML (IV Solutions) IMPLANT
SOLUTION IV LACTATED RINGERS 1000 ML PLASTIC CONTAINER (IV Solutions) IMPLANT
SOLUTION IV LR 1000ML VFLX LF PLS CNTNR (IV Solutions)
STAPLER EXTRA THICK TISSUE L26 CM X W4 (Procedure Accessories) IMPLANT
STAPLER EXTRA THICK TISSUE L26 CM X W4 MM OD12 MM ENDOSCOPIC (Procedure Accessories) IMPLANT
STAPLER INTNL PVC STD UNV EGIA 12MM (Staplers) ×1
STAPLER INTNL TI XL CIRC EEA 25MMX3.5MM (Staplers)
STAPLER INTNL TI XL DST EEA ORVIL 25MM (Staplers)
STAPLER INTNL TI XL UNV EGIA ULT 12MM 26 (Procedure Accessories)
STAPLER L25 MM X H3.5 MM ENDOSCOPIC (Staplers) IMPLANT
STAPLER L25 MM X H3.5 MM ENDOSCOPIC LINEAR CUTTER SQUEEZE HANDLE LARGE (Staplers) IMPLANT
STAPLER OD25 MM ENDOSCOPIC LINEAR CUTTER (Staplers) IMPLANT
STAPLER OD25 MM ENDOSCOPIC LINEAR CUTTER SQUEEZE HANDLE LARGE (Staplers) IMPLANT
STAPLER TISSUE L16 CM X W4 MM OD12 MM (Staplers) ×1 IMPLANT
STAPLER TISSUE L16 CM X W4 MM OD12 MM ENDOSCOPIC ENDO GIA PVC INTERNAL (Staplers) ×1 IMPLANT
STRIP SKIN CLOSURE L4 IN X W1/2 IN (Dressing) ×1 IMPLANT
STRIP SKIN CLOSURE L4 IN X W1/2 IN REINFORCE STERI-STRIP POLYESTER (Dressing) ×1 IMPLANT
STRIP SKNCLS PLSTR STRSTRP 4X.5IN LF (Dressing) ×1
SUTURE ABS 0 CT1 PDS2 27IN MFL VIOL (Suture) ×1
SUTURE ABS 0 UR-6 VCL 27IN BRD COAT VIOL (Suture) ×3
SUTURE ABS 0 VCL 54IN BRD TIE COAT VIOL (Suture) ×1
SUTURE ABS 1 VCL 54IN BRD TIE COAT VIOL (Suture)
SUTURE ABS 2-0 SH VCL 27IN BRD COAT VIOL (Suture)
SUTURE ABS 3-0 SH VCL 27IN BRD COAT UD (Suture) ×1
SUTURE ABS 4-0 PS2 MNCRL MTPS 18IN MFL (Suture) ×2
SUTURE COATED VICRYL 0 L54 IN BRAID TIES (Suture) ×1 IMPLANT
SUTURE COATED VICRYL 0 UR-6 L27 IN BRAID (Suture) ×3 IMPLANT
SUTURE COATED VICRYL 1 L54 IN BRAID TIES (Suture) IMPLANT
SUTURE COATED VICRYL 2-0 SH L27 IN BRAID (Suture) IMPLANT
SUTURE COATED VICRYL 2-0 SH L27 IN BRAID COATED VIOLET ABSORBABLE (Suture) IMPLANT
SUTURE COATED VICRYL 3-0 SH L27 IN BRAID (Suture) ×1 IMPLANT
SUTURE MONOCRYL 4-0 PS-2 L18 IN (Suture) ×2 IMPLANT
SUTURE MONOCRYL 4-0 PS-2 L18 IN MONOFILAMENT UNDYED ABSORBABLE (Suture) ×2 IMPLANT
SUTURE NABSB SLK 0 PRMHND 30IN BRD TIE 6 (Suture) ×1
SUTURE PASSER CONE WECK EFX L9.82 IN (Procedure Accessories) IMPLANT
SUTURE PDS II 0 CT-1 L27 IN MONOFILAMENT (Suture) ×1 IMPLANT
SUTURE PDS II 0 CT-1 L27 IN MONOFILAMENT VIOLET ABSORBABLE (Suture) ×1 IMPLANT
SUTURE SILK PERMA HAND BLACK 0 L30 IN (Suture) ×1 IMPLANT
SUTURE SILK PERMA HAND BLACK 0 L30 IN BRAID TIES 6 STRAND PRECUT (Suture) ×1 IMPLANT
SUTURE V-LOC 90 2-0 GS-21 1/2 CIRCLE L9 IN ABS VIOLET (Instrument) ×3 IMPLANT
SYRINGE LUER LOCK 10CC (Syringes, Needles) ×2 IMPLANT
SYSTEM IMAGING 8X6IN CLEARIFY MICROFIBER WARM HUB TRCR WIPE DSPSBL (Kits) ×1 IMPLANT
SYSTEM IMG MRFBR CLEARIFY 8X6IN WRM HUB (Kits) ×2 IMPLANT
TROCAR ENDOCLOSURE DEVICE (Laparoscopy Supplies)
TROCAR LAPAROSCOPIC BALLOON BLUNT TIP (Laparoscopy Supplies) ×1 IMPLANT
TROCAR LAPAROSCOPIC BALLOON BLUNT TIP L100 MM OD12 MM KII ABDOMINAL (Laparoscopy Supplies) ×1 IMPLANT
TROCAR LAPAROSCOPIC BLADELESS STABILITY SLEEVE L100 MM OD12 MM (Laparoscopy Supplies) ×1 IMPLANT
TROCAR LAPAROSCOPIC STABILITY SLEEVE (Laparoscopy Supplies) ×1
TROCAR LAPAROSCOPIC STABILITY SLVE (Laparoscopy Supplies) ×3
TROCAR LAPSCP EPTH XCL 12MM 100MM LF (Laparoscopy Supplies) ×2 IMPLANT
TROCAR LAPSCP EPTH XCL 5MM 100MM LF STRL (Laparoscopy Supplies) ×6 IMPLANT
TROCAR LAPSCP KII 12MM 100MM LF STRL BLN (Laparoscopy Supplies) ×1
TROCAR OD5 MM L100 MM BLADELESS STABLE SLEEVE ENDOPATH XCEL ENDOSCOPIC (Laparoscopy Supplies) ×3 IMPLANT
TUBING CLIPVAC FILTER DISP (Tubing)
TUBING INSFL THRMPLST 45L PNEUMOSURE (Tubing) ×1
TUBING INSUFFLATION SET HIGH FLOW (Tubing) ×1 IMPLANT
TUBING INSUFFLATION SET HIGH FLOW TOUCHSCREEN PNEUMOSURE THERMOPLASTIC (Tubing) ×1 IMPLANT
TUBING SCT IRR (Suction) ×1
TUNNELER SRG ONQ 17GA 8IN LF STRL SHTH (Procedure Accessories)
TUNNELER SURGICAL L8 IN SHEATH OD17 GA (Procedure Accessories) IMPLANT
TUNNELER SURGICAL L8 IN SHEATH OD17 GA ON-Q* (Procedure Accessories) IMPLANT
V-LOC 90 VIOLET GS12 (Instrument) ×3

## 2018-09-01 NOTE — Progress Notes (Signed)
To pre-op per bed.  Valuables locked with security.  IV patent and intact.  Patient condition stable.

## 2018-09-01 NOTE — H&P (Signed)
BARIATRIC SURGERY  HISTORY AND PHYSICAL EXAM/CONSULTATION    Date Time: 09/01/2018 6:09 AM  Patient Name: Amber [name], 67 y.o., female  Attending Physician: Champ Mungo, MD, FACS, FASMBS    History of Present Illness:   Amber [name] is a 67 y.o. femaleBody mass index is 25.51 kg/m. who presents to the hospital with abdominal pain, bloating, and nausea for the past 2 days.  CT abdomen pelvis was performed at an outside hospital which demonstrated small bowel obstruction with transition point in the pelvis.  Patient had recently had some epigastric pain and underwent an EGD last week, which showed a small hiatal hernia.  Patient states her last BM was yesterday but she has been having some constipation.  No flatus for the past 24+ hours.  Denies previous episodes similar to this.  Patient was transferred to [facility] for further care.      Past Medical History:     Past Medical History:   Diagnosis Date   . Asthma    . Chest pain 2016     Chest pain after eating x6  months--being followed by GI for GERD   . Constipation    . Difficulty walking     amb with cane secondary to arthritis bil knees   . Genital herpes     no current outbreak (10/03/15)   . GERD (gastroesophageal reflux disease)    . Hiatal hernia     h/o surgery   . Hyperlipidemia    . Hypertensive disorder     Well controlled on med. Denies any SOB in last 6 months (10/03/15)   . Low back pain    . Morbid obesity with BMI of 40.0-44.9, adult     BMI 40.9   . Nausea without vomiting    . OSA on CPAP 2015    CPAP nightly x 1 yr   . Osteoarthritis of knees, bilateral     and back   . TIA (transient ischemic attack) 2014    TIA 2014-no residual. Patient evaulated for possible TIA 07/12/2015  @ [facility] (dischage summary in epic)-per summary CT of head was normal and patient was d/c'd       Past Surgical History:     Past Surgical History:   Procedure Laterality Date   . APPENDECTOMY  >25 yrs   . CHOLECYSTECTOMY     . EGD  01/2015   . EGD, BIOPSY  N/A 09/15/2017    Procedure: EGD, BIOPSY;  Surgeon: Pershing Proud, MD;  Location: [facility];  Service: Gastroenterology;  Laterality: N/A;   . fallopian tube surgery  >25 yrs   . INSERTION, PAIN PUMP (MEDICAL) N/A 10/22/2015    Procedure: INSERTION, PAIN PUMP (MEDICAL);  Surgeon: Nicola Police, DO;  Location: [place] [facility];  Service: General;  Laterality: N/A;   . LAPAROSCOPIC, CHOLECYSTECTOMY, CHOLANGIOGRAM  08/13/2014   . LAPAROSCOPIC, GASTRIC BYPASS N/A 10/22/2015    Procedure: LAPAROSCOPIC, GASTRIC BYPASS;  Surgeon: Josefa Half R, DO;  Location: [place] [facility];  Service: General;  Laterality: N/A;   . LAPAROSCOPIC, HERNIORRHAPHY, HIATAL N/A 10/22/2015    Procedure: LAPAROSCOPIC, HERNIORRHAPHY, HIATAL;  Surgeon: Jeanell Sparrow, Hamid R, DO;  Location: [place] [facility];  Service: General;  Laterality: N/A;   . OVARY SURGERY Right >25 yrs   . SPINE SURGERY  11/2013    cervical HNP x 2, Dr. Bufford Buttner       Family History:     Family History  Problem Relation Age of Onset   . Cancer Mother 2        breast cancer   . Breast cancer Mother    . Heart disease Father    . Malignant hyperthermia Neg Hx    . Pseudochol deficiency Neg Hx        Social History:     Social History     Tobacco Use   . Smoking status: Never Smoker   . Smokeless tobacco: Never Used   Substance Use Topics   . Alcohol use: No   . Drug use: No       Allergies:     Allergies   Allergen Reactions   . Acyclovir Nausea And Vomiting     Other reaction(s): gi distress   . Amlodipine      nausea   . Amoxicillin-Pot Clavulanate      Other reaction(s): gi distress     . Aspirin Nausea And Vomiting     Patient states "it upsets my stomach"  Other reaction(s): gi distress  Upsets stomach  Able to tolerate enteric coated aspirin   . Atorvastatin        Palpitation   . Carvedilol      Extreme fatigue   . Lisinopril      nausea   . Lovastatin Nausea And Vomiting     Other reaction(s): gi distress   . Metronidazole Nausea And Vomiting      Other reaction(s): gi distress   . Moxifloxacin Nausea And Vomiting         GI symptoms   . Other Nausea And Vomiting   . Rosuvastatin      Palpitation, chest pain, abd pain    . Statins    . Sulfa Antibiotics Nausea And Vomiting     Other reaction(s): gi distress       Medications:     Medications Prior to Admission   Medication Sig   . amLODIPine (NORVASC) 5 MG tablet Take 0.5 tablets (2.5 mg total) by mouth daily   . aspirin EC 81 MG EC tablet Take 1 tablet (81 mg total) by mouth daily.   Marland Kitchen nystatin (NYSTOP) powder Apply to affected area 3 times daily   . pantoprazole (PROTONIX) 40 MG tablet TAKE 1 TABLET(40 MG) BY MOUTH DAILY   . albuterol (PROVENTIL HFA;VENTOLIN HFA) 108 (90 BASE) MCG/ACT inhaler Inhale into the lungs every 4 (four) hours as needed.      . B Complex Vitamins (VITAMIN-B COMPLEX) Tab Take  by Mouth every day   . cholecalciferol (VITAMIN D-1000 MAX ST) 1000 units tablet Take 1,000 Units by Mouth Once a Day at lunch time   . diazePAM (VALIUM) 2 MG tablet Take 1 tablet (2 mg total) by mouth every 8 (eight) hours as needed for Anxiety.   . fluticasone (FLONASE) 50 MCG/ACT nasal spray 2 sprays by Nasal route daily. (Patient taking differently: 2 sprays by Nasal route as needed.   )   . hyoscyamine (ANASPAZ,LEVSIN) 0.125 MG tablet Take 1 tablet (0.125 mg total) by mouth every 4 (four) hours as needed for Cramping   . Multiple Vitamin (MULTIVITAMIN) capsule Take 1 capsule by mouth daily   . spironolactone (ALDACTONE) 25 MG tablet Take 1 tablet (25 mg total) by mouth daily   . valACYclovir HCL (VALTREX) 500 MG tablet TAKE 1 TABLET(500 MG) BY MOUTH DAILY (Patient taking differently: daily   )   . vitamin B-12 (CYANOCOBALAMIN) 100 MCG tablet Take 50 mcg by  mouth every other day     Prior to Admission medications    Medication Sig Start Date End Date Taking? Authorizing Provider   amLODIPine (NORVASC) 5 MG tablet Take 0.5 tablets (2.5 mg total) by mouth daily 08/09/18  Yes Darnelle Maffucci, MD   aspirin  EC 81 MG EC tablet Take 1 tablet (81 mg total) by mouth daily. 01/25/18  Yes Carmelina Paddock, MD   nystatin (NYSTOP) powder Apply to affected area 3 times daily 05/03/18 05/03/19 Yes Bui, Flonnie Overman, MD   pantoprazole (PROTONIX) 40 MG tablet TAKE 1 TABLET(40 MG) BY MOUTH DAILY 08/15/18  Yes Bui, Flonnie Overman, MD   albuterol (PROVENTIL HFA;VENTOLIN HFA) 108 (90 BASE) MCG/ACT inhaler Inhale into the lungs every 4 (four) hours as needed.    01/26/12   [provider]   B Complex Vitamins (VITAMIN-B COMPLEX) Tab Take  by Mouth every day    [provider]   cholecalciferol (VITAMIN D-1000 MAX ST) 1000 units tablet Take 1,000 Units by Mouth Once a Day at lunch time    [provider]   diazePAM (VALIUM) 2 MG tablet Take 1 tablet (2 mg total) by mouth every 8 (eight) hours as needed for Anxiety. 07/13/17   Bui, Flonnie Overman, MD   fluticasone (FLONASE) 50 MCG/ACT nasal spray 2 sprays by Nasal route daily.  Patient taking differently: 2 sprays by Nasal route as needed.    01/02/14   Bui, Flonnie Overman, MD   hyoscyamine (ANASPAZ,LEVSIN) 0.125 MG tablet Take 1 tablet (0.125 mg total) by mouth every 4 (four) hours as needed for Cramping 08/27/18   Deaira Leckey V, DO   Multiple Vitamin (MULTIVITAMIN) capsule Take 1 capsule by mouth daily    [provider]   spironolactone (ALDACTONE) 25 MG tablet Take 1 tablet (25 mg total) by mouth daily 07/11/18 07/11/19  Darnelle Maffucci, MD   valACYclovir HCL (VALTREX) 500 MG tablet TAKE 1 TABLET(500 MG) BY MOUTH DAILY  Patient taking differently: daily    09/30/17   Bui, Flonnie Overman, MD   vitamin B-12 (CYANOCOBALAMIN) 100 MCG tablet Take 50 mcg by mouth every other day    [provider]       Review of Systems:   History obtained from chart review and the patient  HEENT: no Headaches, blurry vision or tinnitus  Constitutional: negative for fevers, night sweats  Respiratory: negative for SOB, cough  Cardiovascular: negative for chest pain,  palpitations  Gastrointestinal: positive for abdominal pain, nausea, and bloating.  Genitourinary:negative for hematuria, dysuria  Musculoskeletal:negative for bone pain, myalgias and stiff joints  Neurological: negative for dizziness, gait problems, headaches and memory problems  Behavioral/Psych: negative for fatigue, loss of interest in favorite activities, separation anxiety and sleep disturbance  Endocrine: negative for temperature intolerance      Physical Exam:     Vitals:    09/01/18 0321   BP:    Pulse:    Resp:    Temp: 98.2 F (36.8 C)   SpO2:          General Appearance:    Alert, cooperative, no distress, appears stated age   Head:    Normocephalic, without obvious abnormality, atraumatic   Eyes:    PERRL, conjunctiva/corneas clear, EOM's intact       Throat:   Lips, mucosa, and tongue normal; teeth and gums normal   Neck:   Supple, symmetrical, trachea midline, no adenopathy;        thyroid:  No enlargement/tenderness/nodules       Lungs:     Clear to auscultation bilaterally, respirations unlabored       Heart:    Regular rate and rhythm   Abdomen:     Mildly distended, non tender to palpation, tympanic, no masses noted   Extremities:   Extremities normal, atraumatic, no cyanosis or edema   Pulses:   2+ and symmetric all extremities   Skin:   Skin color, texture, turgor normal, no rashes or lesions   Lymph nodes:   Cervical andsupraclavicular nodes normal   Neurologic:   CNII-XII grossly intact. Normal strength, sensation and reflexes throughout       Labs:     @LASTCHEM @  Lab Results   Component Value Date    ALT 13 08/31/2018    AST 20 08/31/2018    ALKPHOS 103 08/31/2018    BILITOTAL 0.4 08/31/2018     Lab Results   Component Value Date    PTT 27 08/31/2018     Lab Results   Component Value Date    VITD 26 (L) 08/16/2018    VITD 33 04/30/2017    VITD 28 04/30/2017    VITD 5 04/30/2017     Lab Results   Component Value Date    CHOL 187 08/16/2018    CHOL 151 01/25/2018    CHOL 175 11/10/2017      Lab Results   Component Value Date    HDL 80 08/16/2018    HDL 53 01/25/2018    HDL 72 11/10/2017     Lab Results   Component Value Date    LDL 92 08/16/2018    LDL 93 01/25/2018    LDL 89 11/10/2017     Lab Results   Component Value Date    TRIG 61 08/16/2018    TRIG 26 (L) 01/25/2018    TRIG 53 11/10/2017     Lab Results   Component Value Date    TSH 2.28 08/16/2018     Lab Results   Component Value Date    WBC 5.60 08/31/2018    HGB 12.6 08/31/2018    HCT 39.3 08/31/2018    PLT 259 08/31/2018    CHOL 187 08/16/2018    TRIG 61 08/16/2018    HDL 80 08/16/2018    LDL 92 08/16/2018    ALT 13 08/31/2018    AST 20 08/31/2018    NA 145 08/31/2018    K 3.2 (L) 08/31/2018    CL 109 08/31/2018    CREAT 0.7 08/31/2018    BUN 15.0 08/31/2018    CO2 22 08/31/2018    TSH 2.28 08/16/2018    INR 1.1 08/31/2018    GLU 75 08/31/2018    HGBA1C 5.0 01/25/2018         Radiology Results (24 Hour)     ** No results found for the last 24 hours. **        Rads:   Radiological Procedures reviewed.  Chest Ap Portable    Result Date: 08/31/2018  Examination: Portable chest. HISTORY: NG tube placement. COMPARISON: 08/31/2018.     Enteric catheter tip in distal stomach. No other significant changes. Adaline Sill, MD 08/31/2018 11:50 PM    Chest Ap Portable    Result Date: 08/31/2018  Frontal chest x-ray Indication: NG tube Comparison: 07/26/2018 chest x-ray      Cardio mediastinal silhouette is within normal limits. No consolidation or pleural effusion. There is a gastric tube, tip  appears to be at the GE junction. This should be advanced Lorinda Creed, MD 08/31/2018 10:39 PM    Ct Abd/pelvis With Iv And Po Contrast    Result Date: 08/31/2018     Indication:    Unspecified abdominal pain Comparison:   03/08/2018 CT Technique:   Axial CT images were obtained through the abdomen and pelvis following injection of intravenous contrast, 100 cc Omnipaque 350. Reformatted coronal and sagittal images were also obtained.    A combination of  automatic exposure control, adjustment of the mA and/or kV  according to patient size and/or use of iterative reconstruction technique was utilized.Enteric contrast also given. Findings: Lung bases: Lung bases are clear. Hepatobiliary: Liver is normal in size and contour. Stable cyst in the liver. Mild biliary duct dilation is stable.  Pancreas enhances normally. Spleen: Within normal limits Adrenal glands: No adrenal nodule Kidneys: Enhance symmetrically. No nephrolithiasis or hydronephrosis. Bowel and mesentery: T" mm.Kerin Salen"))!Load). Dilated small bowel up to 3.8 cm. There is no free air. Small bowel obstruction with transition point in the lower abdomen. Dilated small bowel to 3.8 cm. There is trace ascites. Aorta: Aorta is normal in caliber.  No retroperitoneal lymphadenopathy Pelvis: Bladder is  normally   distended.  No adnexal masses.  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     Small bowel obstruction. Lorinda Creed, MD 08/31/2018 8:47 PM        Assessment:   Small bowel obstruction    Plan:   1. Admit to bariatric surgery service  2. NPO  3. IVF  4. Pain and nausea control  5. Plan to go to OR today for diagnostic laparoscopy with lysis of adhesions possible bowel resection  6. Risks and benefits of this procedure were explained to the patient in detail, including bleeding, infection, damage to surrounding structures, need for further surgery.  Informed consent was obtained.    Signed by: Susette Racer Maeson Lourenco, DO  Bariatric Surgery Fellow

## 2018-09-01 NOTE — Plan of Care (Addendum)
Problem: Safety  Goal: Patient will be free from injury during hospitalization  Outcome: Progressing  Goal: Patient will be free from infection during hospitalization  Outcome: Progressing     Problem: Pain  Goal: Pain at adequate level as identified by patient  Outcome: Progressing  Patient verbalizes no pain.     Problem: Side Effects from Pain Analgesia  Goal: Patient will experience minimal side effects of analgesic therapy  Outcome: Progressing     Problem: Discharge Barriers  Goal: Patient will be discharged home or other facility with appropriate resources  Outcome: Progressing     Problem: Psychosocial and Spiritual Needs  Goal: Demonstrates ability to cope with hospitalization/illness  Outcome: Progressing     Problem: Moderate/High Fall Risk Score >5  Goal: Patient will remain free of falls  Outcome: Progressing     Problem: Altered GI Function  Goal: Fluid and electrolyte balance are achieved/maintained  Outcome: Progressing  Goal: Elimination patterns are normal or improving  Outcome: Progressing  Goal: Nutritional intake is adequate  Outcome: Not Progressing  Patient NPO with NG tube LIS plan for surgery today  Goal: Mobility/Activity is maintained at optimal level for patient  Outcome: Progressing  Goal: No bleeding  Outcome: Progressing

## 2018-09-01 NOTE — Progress Notes (Signed)
Case management note     S: Amber [name]   LOS day: 0  Admit diagnosis: SBO      B: Per chart review " Amber [name] is a 67 y.o. femaleBody mass index is 25.51 kg/m. who presents to the hospital with abdominal pain, bloating, and nausea for the past 2 days.  CT abdomen pelvis was performed at an outside hospital which demonstrated small bowel obstruction with transition point in the pelvis.  Patient had recently had some epigastric pain and underwent an EGD last week, which showed a small hiatal hernia.  Patient states her last BM was yesterday but she has been having some constipation.  No flatus for the past    24+ hours.  Denies previous episodes similar to this.  Patient was transferred to [facility] for further care".         A: Writer met pt to discuss DCP needs.   Pt lives with son and denies any services from SNF, [facility] and [place].   Pt is independent in ADL'S and IADL'S.   DME: none   NG tube in place  Will go into surgery later during the day for SBO       R: DISPO destination pend due to pending OT/PT eval. CM to fu.       Ewing Schlein RN BSN  Clinical Case Manager   [facility]  Phone [phone]  Fax     [phone]      09/01/18 1431   Patient Type   Within 30 Days of Previous Admission? No   Healthcare Decisions   Interviewed: Patient   English as a second language teacher Information: [phone]   Orientation/Decision Making Abilities of Patient Alert and Oriented x3, able to make decisions   Advance Directive Patient does not have advance directive   Healthcare Agent Appointed No   Prior to admission   Prior level of function Independent with ADLs   Type of Residence Private residence   Home Layout Multi-level   Have running water, electricity, heat, etc? Yes   Living Arrangements Spouse/significant other   How do you get to your MD appointments? self    How do you get your groceries? self    Who fixes your meals? self    Who does your laundry? self    Who picks up your  prescriptions? self    Dressing Independent   Grooming Independent   Feeding Independent   Bathing Independent   Toileting Independent   DME Currently at Home Other (Comment)  (n/a)   Name of Prior Assisted Living Facility n/a   Prior SNF admission? (Detail) n/a   Prior Rehab admission? (Detail) n/a   Adult Protective Services (APS) involved? No   Discharge Planning   Support Systems Children;Other (Comment)  (lives with son )   Patient expects to be discharged to: home    Anticipated [place] plan discussed with: Same as interviewed   [place] discussion contact information: same as above    Mode of transportation: Private car (family member)   Consults/Providers   PT Evaluation Needed 1   OT Evalulation Needed 1   SLP Evaluation Needed 2   Correct PCP listed in Epic? Yes   PCP   PCP on file was verified as the current PCP? Yes   Important Message from [facility] Notice   Patient received 1st IMM Letter? Yes   Date of most recent IMM given: 09/01/18

## 2018-09-01 NOTE — Brief Op Note (Signed)
BRIEF OP NOTE    Date Time: 09/01/18 3:40 PM    Patient Name:   Amber [name]    Date of Operation:   09/01/2018    Providers Performing:   Surgeon(s):  Champ Mungo, MD  Deters, Susette Racer, DO    Assistant (s):   Circulator: Minerva Ends, RN  Relief Circulator: Rocky Link, RN  Relief Scrub: Georjean Mode Mae  Scrub Person: Florence Canner    Operative Procedure:   Procedure(s):  LAPAROSCOPY, DIAGNOSTIC  LAPAROSCOPIC, LYSIS, ADHESIONS RELEASE OF  SMALL BOWEL OBSTRUCTION    Preoperative Diagnosis:   Pre-Op Diagnosis Codes:     * SBO (small bowel obstruction) [K56.609]    Postoperative Diagnosis:   Post-Op Diagnosis Codes:     * SBO (small bowel obstruction) [K56.609]    Anesthesia:   General    Estimated Blood Loss:    * No values recorded between 09/01/2018  2:42 PM and 09/01/2018  3:40 PM *    Implants:   * No implants in log *    Drains:   Drains: no    Specimens:   * No specimens in log *      Findings:   Adhesive band adherent on distal roux limb onto anterior abdominal wall causing obstruction    Complications:   none      Signed by: Champ Mungo, MD                                                                           [facility]

## 2018-09-01 NOTE — Plan of Care (Signed)
Problem: Safety  Goal: Patient will be free from injury during hospitalization  Outcome: Progressing  Flowsheets (Taken 09/01/2018 0306)  Patient will be free from injury during hospitalization : Assess patient's risk for falls and implement fall prevention plan of care per policy; Ensure appropriate safety devices are available at the bedside; Assess for patients risk for elopement and implement Elopement Risk Plan per policy; Provide alternative method of communication if needed (communication boards, writing); Include patient/ family/ care giver in decisions related to safety; Provide and maintain safe environment     Problem: Pain  Goal: Pain at adequate level as identified by patient  Outcome: Progressing  Flowsheets (Taken 09/01/2018 0306)  Pain at adequate level as identified by patient: Identify patient comfort function goal; Assess for risk of opioid induced respiratory depression, including snoring/sleep apnea. Alert healthcare team of risk factors identified.; Assess pain on admission, during daily assessment and/or before any "as needed" intervention(s); Reassess pain within 30-60 minutes of any procedure/intervention, per Pain Assessment, Intervention, Reassessment (AIR) Cycle; Evaluate if patient comfort function goal is met; Offer non-pharmacological pain management interventions; Evaluate patient's satisfaction with pain management progress; Include patient/patient care companion in decisions related to pain management as needed  Note:   Was medicated with Dilaudid before arrival on floor at the Urgent care with relief provided. Made aware of PRN pain meds ordered.  Denies need for pain meds at this time.      Problem: Altered GI Function  Goal: Fluid and electrolyte balance are achieved/maintained  Outcome: Progressing  Flowsheets (Taken 09/01/2018 0306)  Fluid and electrolyte balance are achieved/maintained: Monitor intake and output every shift; Monitor/assess lab values and report abnormal  values; Provide adequate hydration; Assess for confusion/personality changes; Assess and reassess fluid and electrolyte status; Observe for seizure activity and initiate seizure precautions if indicated; Monitor for muscle weakness  Note:   IV fluids started as ordered. NPO at this time.   Goal: Elimination patterns are normal or improving  Outcome: Progressing  Flowsheets (Taken 09/01/2018 0310)  Elimination patterns are normal or improving: Report abnormal assessment to physician; Assess for normal bowel sounds; Monitor for abdominal discomfort; Administer treatments as ordered; Assess for flatus; Collaborate with LIP for containment device; Administer medications to improve bowel evacuation as prescribed; Anticipate/assist with toileting needs; Monitor for abdominal distension; Assess for signs and symptoms of bleeding.  Report signs of bleeding to physician; Consult/collaborate with Clinical Nutritionist; Reinforce education on foods that improve and complicate bowel movements and how activity and medications can affect bowel movements; Encourage /perform oral hygiene as appropriate  Goal: Mobility/Activity is maintained at optimal level for patient  09/01/2018 0310 by Drenda Freeze, RN  Outcome: Progressing  Flowsheets (Taken 09/01/2018 0306)  Mobility/activity is maintained at optimal level for patient: Increase mobility as tolerated/progressive mobility;Encourage independent activity per ability;Maintain proper body alignment;Perform active/passive ROM;Reposition patient every 2 hours and as needed unless able to reposition self;Plan activities to conserve energy, plan rest periods;Assess for changes in respiratory status, level of consciousness and/or development of fatigue  09/01/2018 0306 by Drenda Freeze, RN  Outcome: Progressing  Flowsheets (Taken 09/01/2018 0306)  Mobility/activity is maintained at optimal level for patient: Increase mobility as tolerated/progressive mobility;Encourage independent  activity per ability;Maintain proper body alignment;Perform active/passive ROM;Reposition patient every 2 hours and as needed unless able to reposition self;Plan activities to conserve energy, plan rest periods;Assess for changes in respiratory status, level of consciousness and/or development of fatigue  Goal: No bleeding  Outcome: Progressing  Flowsheets (Taken  09/01/2018 0306)  No bleeding : Monitor and assess vitals and hemodynamic parameters; Monitor/assess lab values and report abnormal values; Assess for bruising/petechia     Problem: Altered GI Function  Goal: Elimination patterns are normal or improving  Outcome: Progressing  Flowsheets (Taken 09/01/2018 0310)  Elimination patterns are normal or improving: Report abnormal assessment to physician; Assess for normal bowel sounds; Monitor for abdominal discomfort; Administer treatments as ordered; Assess for flatus; Collaborate with LIP for containment device; Administer medications to improve bowel evacuation as prescribed; Anticipate/assist with toileting needs; Monitor for abdominal distension; Assess for signs and symptoms of bleeding.  Report signs of bleeding to physician; Consult/collaborate with Clinical Nutritionist; Reinforce education on foods that improve and complicate bowel movements and how activity and medications can affect bowel movements; Encourage /perform oral hygiene as appropriate     Problem: Altered GI Function  Goal: Mobility/Activity is maintained at optimal level for patient  09/01/2018 0310 by Drenda Freeze, RN  Outcome: Progressing  Flowsheets (Taken 09/01/2018 0306)  Mobility/activity is maintained at optimal level for patient: Increase mobility as tolerated/progressive mobility;Encourage independent activity per ability;Maintain proper body alignment;Perform active/passive ROM;Reposition patient every 2 hours and as needed unless able to reposition self;Plan activities to conserve energy, plan rest periods;Assess for changes in  respiratory status, level of consciousness and/or development of fatigue  09/01/2018 0306 by Drenda Freeze, RN  Outcome: Progressing  Flowsheets (Taken 09/01/2018 0306)  Mobility/activity is maintained at optimal level for patient: Increase mobility as tolerated/progressive mobility;Encourage independent activity per ability;Maintain proper body alignment;Perform active/passive ROM;Reposition patient every 2 hours and as needed unless able to reposition self;Plan activities to conserve energy, plan rest periods;Assess for changes in respiratory status, level of consciousness and/or development of fatigue     Problem: Altered GI Function  Goal: No bleeding  Outcome: Progressing  Flowsheets (Taken 09/01/2018 0306)  No bleeding : Monitor and assess vitals and hemodynamic parameters; Monitor/assess lab values and report abnormal values; Assess for bruising/petechia

## 2018-09-01 NOTE — ED Notes (Signed)
Pt transferred to [facility] via BLS protocol. CV reveals SR omn EKG + pulses and no edema. Resp are reg and unlabored.Sat 100%. NGT clamped for transport with X ray and air bolus verification. NGT now mat 63cm and clamped for transport. Pt given Dilaudid 1 mg prior to transport for pain. IV site # 20 is CDI VSS and she is A&O x 4

## 2018-09-01 NOTE — Progress Notes (Signed)
Patient arrived via Ambulance transport/stretcher at 0215 , awake and able to transfer from stretcher to bed with no difficulty. Gait steady. Alert and oriented x 3, no complaints voiced at time of arrival on floor. NG tube to low intermittent suction intact at this time with moderate amount of light brown frothy discharge. Patient states she took oral contrast at the Urgent Care for the CT. Oriented to call bell and reminded to call for assistance when needing to get out of bed. Will continue to monitor.

## 2018-09-01 NOTE — Transfer of Care (Signed)
Anesthesia Transfer of Care Note    Patient: Amber [name]    Procedures performed: Procedure(s):  LAPAROSCOPY, DIAGNOSTIC  LAPAROSCOPIC, LYSIS, ADHESIONS RELEASE OF  SMALL BOWEL OBSTRUCTION    Anesthesia type: General ETT    Patient location:Phase I PACU    Last vitals:   Vitals:    09/01/18 1402   BP: 142/71   Pulse: 83   Resp: 18   Temp: 37.3 C (99.2 F)   SpO2: 97%       Post pain: Patient not complaining of pain, continue current therapy      Mental Status:awake    Respiratory Function: tolerating nasal cannula    Cardiovascular: stable    Nausea/Vomiting: patient not complaining of nausea or vomiting    Hydration Status: adequate    Post assessment: no apparent anesthetic complications    Signed by: Shea Stakes.  09/01/18 4:03 PM

## 2018-09-01 NOTE — Anesthesia Preprocedure Evaluation (Addendum)
Anesthesia Evaluation    AIRWAY    Mallampati: II    TM distance: >3 FB  Neck ROM: full  Mouth Opening:full  Planned to use difficult airway equipment: No CARDIOVASCULAR    cardiovascular exam normal       DENTAL         PULMONARY    pulmonary exam normal     OTHER FINDINGS    One missing molar lower right and a chipped molar upper left.              Relevant Problems   ANESTHESIA   (+) Obstructive sleep apnea syndrome      PULMONARY   (+) Obstructive sleep apnea syndrome      NEURO/PSYCH   (+) Headache      CARDIO   (+) Essential hypertension   (+) Transient cerebral ischemia, unspecified type      GI   (+) Gastroesophageal reflux disease   (+) Hiatal hernia               Anesthesia Plan    ASA 2 - emergent     general               (K 3.2 yesterday, now 4.1. Plan: GETA. Rapid sequence induction. Risks explained.)      intravenous induction   Detailed anesthesia plan: general endotracheal        Post op pain management: per surgeon    informed consent obtained    Plan discussed with CRNA.      pertinent labs reviewed             Signed by: Caro Laroche 09/01/18 2:15 PM

## 2018-09-02 ENCOUNTER — Encounter: Payer: Self-pay | Admitting: Surgery

## 2018-09-02 DIAGNOSIS — K56609 Unspecified intestinal obstruction, unspecified as to partial versus complete obstruction: Secondary | ICD-10-CM

## 2018-09-02 HISTORY — DX: Unspecified intestinal obstruction, unspecified as to partial versus complete obstruction: K56.609

## 2018-09-02 LAB — CBC AND DIFFERENTIAL
Absolute NRBC: 0 10*3/uL (ref 0.00–0.00)
Basophils Absolute Automated: 0.02 10*3/uL (ref 0.00–0.08)
Basophils Automated: 0.4 %
Eosinophils Absolute Automated: 0.01 10*3/uL (ref 0.00–0.44)
Eosinophils Automated: 0.2 %
Hematocrit: 32.4 % — ABNORMAL LOW (ref 34.7–43.7)
Hgb: 10.4 g/dL — ABNORMAL LOW (ref 11.4–14.8)
Immature Granulocytes Absolute: 0.02 10*3/uL (ref 0.00–0.07)
Immature Granulocytes: 0.4 %
Lymphocytes Absolute Automated: 0.88 10*3/uL (ref 0.42–3.22)
Lymphocytes Automated: 15.4 %
MCH: 27.8 pg (ref 25.1–33.5)
MCHC: 32.1 g/dL (ref 31.5–35.8)
MCV: 86.6 fL (ref 78.0–96.0)
MPV: 10.3 fL (ref 8.9–12.5)
Monocytes Absolute Automated: 0.67 10*3/uL (ref 0.21–0.85)
Monocytes: 11.8 %
Neutrophils Absolute: 4.1 10*3/uL (ref 1.10–6.33)
Neutrophils: 71.8 %
Nucleated RBC: 0 /100 WBC (ref 0.0–0.0)
Platelets: 222 10*3/uL (ref 142–346)
RBC: 3.74 10*6/uL — ABNORMAL LOW (ref 3.90–5.10)
RDW: 14 % (ref 11–15)
WBC: 5.7 10*3/uL (ref 3.10–9.50)

## 2018-09-02 LAB — BASIC METABOLIC PANEL
Anion Gap: 10 (ref 5.0–15.0)
BUN: 5 mg/dL — ABNORMAL LOW (ref 7–19)
CO2: 21 mEq/L — ABNORMAL LOW (ref 22–29)
Calcium: 8.9 mg/dL (ref 8.5–10.5)
Chloride: 110 mEq/L (ref 100–111)
Creatinine: 0.5 mg/dL — ABNORMAL LOW (ref 0.6–1.0)
Glucose: 77 mg/dL (ref 70–100)
Potassium: 4.3 mEq/L (ref 3.5–5.1)
Sodium: 141 mEq/L (ref 136–145)

## 2018-09-02 LAB — ECG 12-LEAD
Atrial Rate: 80 {beats}/min
P Axis: 49 degrees
P-R Interval: 152 ms
Q-T Interval: 376 ms
QRS Duration: 82 ms
QTC Calculation (Bezet): 433 ms
R Axis: -6 degrees
T Axis: 38 degrees
Ventricular Rate: 80 {beats}/min

## 2018-09-02 LAB — GFR: EGFR: >60

## 2018-09-02 MED ORDER — ONDANSETRON 4 MG PO TBDP
4.00 mg | ORAL_TABLET | Freq: Four times a day (QID) | ORAL | 0 refills | Status: DC | PRN
Start: 2018-09-02 — End: 2018-12-23

## 2018-09-02 MED ORDER — OXYCODONE HCL 5 MG PO TABS
5.0000 mg | ORAL_TABLET | Freq: Four times a day (QID) | ORAL | 0 refills | Status: AC | PRN
Start: 2018-09-02 — End: 2018-09-09

## 2018-09-02 MED ORDER — ACETAMINOPHEN 160 MG/5ML PO SOLN
650.00 mg | Freq: Four times a day (QID) | ORAL | 0 refills | Status: AC
Start: 2018-09-02 — End: 2018-09-07

## 2018-09-02 NOTE — Discharge Summary -  Nursing (Signed)
SL removed prior to discharge, pressure dressing applied. Discharge instructions reviewed with pt. Prescriptions filled prior to discharge. Pt verbalized understanding of all instructions given. Pt given information about low fiber diet. Pt transported to lobby via wheelchair for discharge.

## 2018-09-02 NOTE — Discharge Instr - AVS First Page (Signed)
Drs. Fermin Schwab, [place], and Pourshojae   [address]  [place], [place] [address]   [phone]   [phone]    [address]   [place], [place] [address]   [phone]   [phone]       PATIENT DISCHARGE INFORMATION: BARIATRIC SURGERY  We are concerned about your health and have developed a few guidelines to help you during your hospitalization and at home. Please call your physician if you have any questions about your care when you get home.                DIET  Soft foods for the next 1-2 weeks.  Avoid high fiber foods.    Sip on water throughout the day. Ensure to get a minimum of 50 grams (for women) and 63 grams (men) of protein per day from your supplement.    Avoid foods high in sugar or fat. Maintain an adequate protein intake. Take chewable multivitamin supplement daily    If vomiting occurs, and persists more than 24 hours, please notify your doctor.    ACTIVITY  Avoid heavy lifting (more than 10 pounds) or strenuous exercise for at least 4 weeks.    Walk daily, gradually increasing speed with a goal of 2 miles.  Ok to go up and down stairs.    MEDICATION    Follow the medication instructions as written by your doctor.     You should refrain from drinking alcohol, you should refrain from driving if you are taking a narcotic medication for pain.    If the pain is unrelieved by the prescribed medication, please call you doctor.    Take stool softeners to prevent constipation.  May use Miralax daily as needed.    INCISION  Keep your incision clean and dry. You may shower daily and pat your incision dry. No bathing or swimming for 4 weeks    Check you incision daily and notify your doctor if you notice any redness or drainage.    It is not necessary to take your temperature routinely, but if you feel like you have a fever and it is 101 degrees Farenheit or more, please call your doctor.    OTHER CARE MEASURES   Continue to use incentive  spirometer 4 times a day until your next doctor visit.  Make sure you are drinking enough and urinating at least 4-6 times per day.  Notify Pysician with:    -fever >101  -one sided calf pain  -vomiting  -increasing abdominal pain  -sudden onset shoulder pain

## 2018-09-02 NOTE — Progress Notes (Signed)
Case management note     S: Amber [name]   LOS day: 0  Admit diagnosis: SBO      B: Per chart review " Amber [name] a 67 y.o.femaleBody mass index is 25.51 kg/m.who presents to the hospital with abdominal pain, bloating, and nausea for the past 2 days. CT abdomen pelvis was performed at an outside hospital which demonstrated small bowel obstruction with transition point in the pelvis. Patient had recently had some epigastric pain and underwent an EGD last week, which showed a small hiatal hernia. Patient states her last BM was yesterday but she has been having some constipation. No flatus for the past    24+ hours. Denies previous episodes similar to this. Patient was transferred to [facility] for further care".        A: Per chart review "   SL removed prior to discharge, pressure dressing applied. Discharge instructions reviewed with pt. Prescriptions filled prior to discharge. Pt verbalized understanding of all instructions given. Pt given information about low fiber diet. Pt transported to lobby via wheelchair for discharge".                   R: DISPO home and no CM needs identified.       Ewing Schlein RN BSN  Clinical Case Manager   [facility]  Phone [phone]  Fax     [phone]        09/02/18 1422   Discharge Disposition   Patient preference/choice provided? Yes   Physical Discharge Disposition Home   Mode of Transportation Car   Patient/Family/POA notified of transfer plan Yes   Patient agreeable to discharge plan/expected d/c date? Yes   Family/POA agreeable to discharge plan/expected d/c date? Yes   Bedside nurse notified of transport plan? Yes   CM Interventions   Follow up appointment scheduled? No   Reason no follow up scheduled? Family to schedule   Referral made for home health RN visit? No, Other (comment)  (n/a)   Multidisciplinary rounds/family meeting before d/c? Yes    Medicare Checklist   Is this a Medicare patient? Yes   Patient received 1st IMM Letter? Yes

## 2018-09-02 NOTE — Discharge Summary (Signed)
Drs. Fermin Schwab, [place], and Pourshojae   [address], [address]   [place], [place] [address]   [phone]   [phone]     [address]   [place], [place] [address]   [phone]   [phone]  23 Hr Discharge Summary Note    Date Time: 09/02/18 12:56 PM  Patient Name: Amber [name], Amber [name]      Subjective:   Feels well pain well controlled with PO medications. Ambulating and tolerating diet. No Nausea and vomiting    Medications:     Current Facility-Administered Medications   Medication Dose Route Frequency   . acetaminophen  650 mg Oral Q4H   . amLODIPine  2.5 mg Oral Daily   . famotidine  20 mg Intravenous Q12H [facility]       Physical Exam:     Vitals:    09/02/18 1123   BP: 157/90   Pulse: 79   Resp: 18   Temp: 99 F (37.2 C)   SpO2: 98%       Intake and Output Summary (Last 24 hours) at Date Time    Intake/Output Summary (Last 24 hours) at 09/02/2018 1256  Last data filed at 09/02/2018 1110  Gross per 24 hour   Intake 2400 ml   Output 2850 ml   Net -450 ml       General appearance - alert, well appearing, and in no distress, oriented to person, place, and time and acyanotic, in no respiratory distress  Mental status - alert, oriented to person, place, and time  Chest - clear to auscultation, no wheezes, rales or rhonchi, symmetric air entry  Heart - normal rate, regular rhythm, normal S1, S2, no murmurs, rubs, clicks or gallops  Abdomen - soft, nontender, nondistended  bowel sounds normal, Incisions intact and appropriately tender  Extremities - peripheral pulses normal, no pedal edema, no clubbing or cyanosis, no edema, redness or tenderness in the calves or thighs    Labs:     Results     Procedure Component Value Units Date/Time    GFR [[id number]] Collected:  09/02/18 0405     Updated:  09/02/18 0635     EGFR >60.0    Basic metabolic panel (QAM x 1) [[id number]]  (Abnormal) Collected:  09/02/18 0405    Specimen:  Blood Updated:  09/02/18 0635     Glucose 77 mg/dL      BUN 5  mg/dL      Creatinine 0.5 mg/dL      Calcium 8.9 mg/dL      Sodium 983 mEq/L      Potassium 4.3 mEq/L      Chloride 110 mEq/L      CO2 21 mEq/L      Anion Gap 10.0    CBC and differential (QAM x 1) [[id number]]  (Abnormal) Collected:  09/02/18 0405    Specimen:  Blood Updated:  09/02/18 0521     WBC 5.70 x10 3/uL      Hgb 10.4 g/dL      Hematocrit 67.3 %      Platelets 222 x10 3/uL      RBC 3.74 x10 6/uL      MCV 86.6 fL      MCH 27.8 pg      MCHC 32.1 g/dL      RDW 14 %      MPV 10.3 fL      Neutrophils 71.8 %      Lymphocytes  Automated 15.4 %      Monocytes 11.8 %      Eosinophils Automated 0.2 %      Basophils Automated 0.4 %      Immature Granulocyte 0.4 %      Nucleated RBC 0.0 /100 WBC      Neutrophils Absolute 4.10 x10 3/uL      Abs Lymph Automated 0.88 x10 3/uL      Abs Mono Automated 0.67 x10 3/uL      Abs Eos Automated 0.01 x10 3/uL      Absolute Baso Automated 0.02 x10 3/uL      Absolute Immature Granulocyte 0.02 x10 3/uL      Absolute NRBC 0.00 x10 3/uL     POTASSIUM WHOLE BLOOD (for use at [facility] [place] [facility]) [[id number]] Collected:  09/01/18 1419     Updated:  09/01/18 1427     Potassium Whole Blood 4.1 mEq/L     Narrative:       ON ALL RENAL FAILURE PATIENTS          Rads:     Radiology Results (24 Hour)     ** No results found for the last 24 hours. **          Assessment:   POD # 1 S/P Procedure(s):  LAPAROSCOPY, DIAGNOSTIC  LAPAROSCOPIC, LYSIS, ADHESIONS RELEASE OF  SMALL BOWEL OBSTRUCTION  Diagnosis:  Pre-Op Diagnosis Codes:     * SBO (small bowel obstruction) [K56.609]  Doing well    Plan:     Will advance diet  Encourage Ambulation/ Incentive spirometer and Fluids  Will discharge to home on pain meds, zofran, PPIs, and lovenox  Follow up in the office 10-14 days  Signed by: Champ Mungo, MD}

## 2018-09-02 NOTE — Op Note (Signed)
Drs. Fermin Schwab, [place], and Pourshojae   [address]   [place], [place] [address]   [phone]   [phone]     [address]   [place], [place] [address]   [phone]   [phone]  Op Note    Patient: Amber [name], Amber [name]    Date: 09/02/18    Procedure date: 09/01/2018      Pre op diagnosis: Pre-Op Diagnosis Codes:     * SBO (small bowel obstruction) [K56.609]  Post op diagnosis: same    Procedure: Procedure(s):  LAPAROSCOPY, DIAGNOSTIC  LAPAROSCOPIC, LYSIS, ADHESIONS RELEASE OF  SMALL BOWEL OBSTRUCTION                        Surgeon: Surgeon(s) and Role:     Champ Mungo, MD - Primary     *    Assistant: Deters, Susette Racer, DO - Fellowwho was essential and present throughout the procedure to help in   positioning, transfer, port placement, skin closure, retraction and   exposure during critical parts of the procedure, also running the camera   and scope.       Estimated blood loss: minimal    Complications:none    Anaesthesia: General Endotracheal Intubation    Findings: Same    Specimen: none    Drain:  None    Implant: * No implants in log *    INDICATIONS:  Amber [name] is a 67 y.o.  female who was found to have a small bowel obstruction s/p gastric bypass.  The patient was counseled to undergo diagnostic laparoscopy, possible laparotomy, possible bowel resection.  Risks include--bleeding, infection, recurrence, abscess, leakage, DVT/PE, MI, CVA, etc.  She understands and wishes to proceed.     DESCRIPTION OF PROCEDURE:  Amber [name] was admitted through the preoperative holding area, received preoperative antibiotics,  and compression stockings were placed.  Patient was identified by myself in the preoperative area, where surgical consent was once again obtained and all further questions and concerns were addressed; was then taken to operating room and was placed in supine position.  General endotracheal anesthesia was instituted. Footboard was placed  and pressure points were padded.  The abdomen was prepped in the usual sterile fashion.  Surgical pause was made and an incision was made just below the umbilicus with a 15 blade scalpel.  Dissection was carried down to the midline grasping the anterior rectus sheath with tow Kockers.  Midline incision was made, peritoneum was seen and grasped with 2 hemostat clamps.  This was sharply incised.  A66mm balloon tipped port was placed under direct vision and laparoscope into the abdominal cavity.  There were no intra-abdominal injuries.  Bilateral lateral TAPP blocks were performed laparoscopically using total of 40cc of 0.5% Marcaine with epinephrine.      I immediately identified dilated small bowel and adhesions to the abdominal wall with bowel stuck against.   A 5-mm trocar was placed in left flank.  A 5-mm trocar was placed in the right upper quadrant.  We sharply dissected the bowel and adhesion off the abdominal wall.  This freed up the obstruction with was the entero-enterostomy.  5mm port was placed on the left mid quadrant.      The rest of bowel was run from proximal Roux limb to the entero-enterostomy and then to the terminal ileum.  No other kinks or adhesions were seen.  I lifted up the  roux limb over the transverse colon, the Peterson defect was well closed.  The entero-enterostomy mesenteric defect was also well closed.  I re-evaluated the area of dissection, no bleeding or bowel injuries were seen.  At this time, the trocars were all removed  under direct vision.  No bleeding was noted from any of the trocar sites.  Skin incisions were approximated #4-0 Monocryl suture.  Dermabond was placed over the incision sites.  The patient was then extubated  and taken to the post anesthesia care unit without any complications,  tolerating the procedure well.      Signed by: Champ Mungo, MD    [facility]

## 2018-09-02 NOTE — Plan of Care (Signed)
Patient progressing slowly with some moderate pain, x2 pain doses; no c/o nausea, encouraged more activity when awake. Lap sites intact. Abdomen soft and non distended; has active bowel sounds but no flatus at this time. Continue with plan of care.

## 2018-09-02 NOTE — Progress Notes (Signed)
Patient interviewed and denies any anesthesia complications such as nausea, vomiting or sore throat.  + voiding  IS encouraged with splinting

## 2018-09-02 NOTE — UM Notes (Signed)
This clinical review is based on/compiled from documentation provided by the treatment team within the patient's medical record.    Raymond Gurney, RN, BSN  Clinical Case Manager  [facility]    [address]    [place], [place] [address]  NPI: [id number]  Tax ID: [id number]  Phone: [phone]  Fax: [phone]    Please use fax number [phone] to provide authorization for hospital services or to request additional information.         PATIENT NAME: Amber [name], Amber [name]  DOB: August 18, 1951  PMH:   Diagnosis   Asthma   Chest pain   Constipation   Difficulty walking   Genital herpes   GERD (gastroesophageal reflux disease)   Hiatal hernia   Hyperlipidemia   Hypertensive disorder   Low back pain   Morbid obesity with BMI of 40.0-44.9, adult   Nausea without vomiting   OSA on CPAP   Osteoarthritis of knees, bilateral   TIA (transient ischemic attack)     ADMITTED ON: 09/01/2018 @ 6661 67 year old female presents as transfer from outside ED with SBO and NG tube in place    ED Triage Vitals   Enc Vitals Group      BP 09/01/18 0232 134/62      Heart Rate 09/01/18 0232 67      Resp Rate 09/01/18 0232 16      Temp 09/01/18 0232 98.2 F (36.8 C)      Temp Source 09/01/18 0808 Oral      SpO2 09/01/18 0232 99 %      Weight 09/01/18 0641 62.4 kg (137 lb 8 oz)      Height 09/01/18 0651 1.549 m (5' 0.98")        ADMISSION DIAGNOSIS:   09/01/18 0247  Admit to Inpatient Once    Status:    Question Answer Comment   Admitting Physician NAIN, RAJEV    Diagnosis SBO (small bowel obstruction)    Estimated Length of Stay > or = to 2 midnights    Tentative Discharge Plan? Home or Self Care    Patient Class Inpatient                09/01/2018  NOTES:   Assessment:   Small bowel obstruction    Plan:   1. Admit to bariatric surgery service  2. NPO  3. IVF  4. Pain and nausea control  5. Plan to go to OR today for diagnostic laparoscopy with lysis of adhesions possible bowel resection  6. Risks and benefits of this  procedure were explained to the patient in detail, including bleeding, infection, damage to surrounding structures, need for further surgery.  Informed consent was obtained.          MEDS:   Cleocin 600mg  IV  Pepcid 20mg  IV    NaCl with KCL 139ml/hr IV continuous      09/02/18: Patient remains on surgical unit POD 1 lysis of adhesions    Vitals:    09/01/18 1739 09/01/18 1935 09/01/18 2326 09/02/18 0402   BP: 167/89 (!) 164/97 127/73 147/78   Pulse: 77 76 85 96   Resp: 20 16 16 16    Temp: 98.4 F (36.9 C) 98.2 F (36.8 C) 98.4 F (36.9 C) 98.8 F (37.1 C)   TempSrc: Oral Oral Oral Oral   SpO2: 99% 96% 100% 100%   Weight:       Height:  Meds:  Current Facility-Administered Medications   Medication Dose Route Frequency   . acetaminophen  650 mg Oral Q4H   . amLODIPine  2.5 mg Oral Daily   . famotidine  20 mg Intravenous Q12H [facility]     Current Facility-Administered Medications   Medication Dose Route Frequency Last Rate   . 0.9 % NaCl with KCl 20 mEq   Intravenous Continuous 125 mL/hr (09/02/18 0013)     Current Facility-Administered Medications   Medication Dose Route   . albuterol  1 puff Inhalation   . diazePAM  2 mg Oral   . ketorolac  15 mg Intravenous   . morphine  4 mg Intravenous   . naloxone  0.2 mg Intravenous   . ondansetron  4 mg Oral    Or   . ondansetron  4 mg Intravenous   . oxyCODONE  5 mg Oral   . prochlorperazine  10 mg Intravenous

## 2018-09-02 NOTE — Progress Notes (Signed)
PROGRESS NOTE    Date Time: 09/02/18 6:47 AM  Patient Name: Amber [name], Amber [name]      Subjective:   Feels well, pain controlled. Ambulating. Nausea controlled, no vomiting. Performing incentive spirometry well.  Tolerating clear liquid diet.    Medications:     Current Facility-Administered Medications   Medication Dose Route Frequency   . acetaminophen  650 mg Oral Q4H   . amLODIPine  2.5 mg Oral Daily   . famotidine  20 mg Intravenous Q12H [facility]       Physical Exam:     Vitals:    09/02/18 0402   BP: 147/78   Pulse: 96   Resp: 16   Temp: 98.8 F (37.1 C)   SpO2: 100%       Intake and Output Summary (Last 24 hours) at Date Time    Intake/Output Summary (Last 24 hours) at 09/02/2018 0647  Last data filed at 09/02/2018 0500  Gross per 24 hour   Intake 3357 ml   Output 2600 ml   Net 757 ml       General: alert, well appearing, and in no distress  Chest: nonlabored respirations  Heart: normal rate, regular rhythm  Abdomen: soft, appropriately tender, non distended, incisions intact  Extremities: warm and well perfused    Labs:     Results     Procedure Component Value Units Date/Time    GFR [[id number]] Collected:  09/02/18 0405     Updated:  09/02/18 0635     EGFR >60.0    Basic metabolic panel (QAM x 1) [[id number]]  (Abnormal) Collected:  09/02/18 0405    Specimen:  Blood Updated:  09/02/18 0635     Glucose 77 mg/dL      BUN 5 mg/dL      Creatinine 0.5 mg/dL      Calcium 8.9 mg/dL      Sodium 629 mEq/L      Potassium 4.3 mEq/L      Chloride 110 mEq/L      CO2 21 mEq/L      Anion Gap 10.0    CBC and differential (QAM x 1) [[id number]]  (Abnormal) Collected:  09/02/18 0405    Specimen:  Blood Updated:  09/02/18 0521     WBC 5.70 x10 3/uL      Hgb 10.4 g/dL      Hematocrit 01.0 %      Platelets 222 x10 3/uL      RBC 3.74 x10 6/uL      MCV 86.6 fL      MCH 27.8 pg      MCHC 32.1 g/dL      RDW 14 %      MPV 10.3 fL      Neutrophils 71.8 %      Lymphocytes Automated 15.4 %      Monocytes 11.8 %      Eosinophils Automated 0.2 %       Basophils Automated 0.4 %      Immature Granulocyte 0.4 %      Nucleated RBC 0.0 /100 WBC      Neutrophils Absolute 4.10 x10 3/uL      Abs Lymph Automated 0.88 x10 3/uL      Abs Mono Automated 0.67 x10 3/uL      Abs Eos Automated 0.01 x10 3/uL      Absolute Baso Automated 0.02 x10 3/uL      Absolute Immature Granulocyte 0.02 x10 3/uL      Absolute  NRBC 0.00 x10 3/uL     POTASSIUM WHOLE BLOOD (for use at [facility]) [[id number]] Collected:  09/01/18 1419     Updated:  09/01/18 1427     Potassium Whole Blood 4.1 mEq/L     Narrative:       ON ALL RENAL FAILURE PATIENTS          Rads:     Radiology Results (24 Hour)     ** No results found for the last 24 hours. **          Assessment:   POD # 1 S/P Laparoscopic Procedure(s):  LAPAROSCOPY, DIAGNOSTIC  LAPAROSCOPIC, LYSIS, ADHESIONS RELEASE OF  SMALL BOWEL OBSTRUCTION  Doing well, progressing as anticipated    Plan:     Will advance diet to soft foods  Encourage Ambulation/ Incentive spirometer  Continue IV fluids  Transition to oral medications  DVT prophylaxis, SCDs  Plan for discharge home this afternoon    Signed by: Susette Racer Denario Bagot, DO  Bariatric Surgery Fellow

## 2018-09-02 NOTE — Anesthesia Postprocedure Evaluation (Signed)
Anesthesia Post Evaluation    Patient: Amber [name]    Procedure(s):  LAPAROSCOPY, DIAGNOSTIC  LAPAROSCOPIC, LYSIS, ADHESIONS RELEASE OF  SMALL BOWEL OBSTRUCTION    Anesthesia type: general    Last Vitals:   Vitals Value Taken Time   BP 143/66 09/01/2018  5:20 PM   Temp 36.5 C (97.7 F) 09/01/2018  4:00 PM   Pulse 65 09/01/2018  5:20 PM   Resp 14 09/01/2018  5:20 PM   SpO2 98 % 09/01/2018  5:20 PM                 Anesthesia Post Evaluation:     Patient Evaluated: PACU  Patient Participation: complete - patient participated  Level of Consciousness: awake  Pain Score: 2  Pain Management: adequate    Airway Patency: patent    Anesthetic complications: No      PONV Status: none    Cardiovascular status: acceptable  Respiratory status: acceptable  Hydration status: acceptable        Signed by: Caro Laroche, 09/02/2018 8:53 AM

## 2018-09-04 LAB — ECG 12-LEAD
Atrial Rate: 80 {beats}/min
P Axis: 49 degrees
P-R Interval: 152 ms
Q-T Interval: 376 ms
QRS Duration: 82 ms
QTC Calculation (Bezet): 433 ms
R Axis: -6 degrees
T Axis: 38 degrees
Ventricular Rate: 80 {beats}/min

## 2018-09-06 ENCOUNTER — Encounter (INDEPENDENT_AMBULATORY_CARE_PROVIDER_SITE_OTHER): Payer: Self-pay | Admitting: Cardiology

## 2018-09-06 ENCOUNTER — Encounter (INDEPENDENT_AMBULATORY_CARE_PROVIDER_SITE_OTHER): Payer: Self-pay

## 2018-09-06 ENCOUNTER — Ambulatory Visit (INDEPENDENT_AMBULATORY_CARE_PROVIDER_SITE_OTHER): Payer: Medicare Other | Admitting: Cardiology

## 2018-09-06 VITALS — BP 158/97 | HR 76 | Wt 133.0 lb

## 2018-09-06 DIAGNOSIS — G459 Transient cerebral ischemic attack, unspecified: Secondary | ICD-10-CM

## 2018-09-06 DIAGNOSIS — I10 Essential (primary) hypertension: Secondary | ICD-10-CM

## 2018-09-06 NOTE — Progress Notes (Signed)
Amber [name] [phone].o. with a history of HTN presents for cardiac follow up    Since her gastric surgery, she has been intolerant to medications due to GI distress. Recent bowel obstruction. BP mildly elevated post op. Still able to tolerate norvasc 2.5 mg daily; she stopped aldactone    HTN  - blood pressure mildly controlled on norvasc 2.5 mg daily   - in setting of post op pain    Cardiac work up has included  - stress test (11/06/17 - normal myocardial perfusion)  - cardiac cath ( 01/08/14 - normal )  - echocardiogram ( EF 55%, mild TR)  - 24 hour holter - no significant events  CVD  - hx of TIA - occurred when not on ASA  - no recurrence        Palpitations  - recurrent daily palpitations  - no significant events on 30 day event monitor in the past   -  Normal recent 24 hour holter          Past Medical History:   Diagnosis Date   . Asthma    . Chest pain 2016     Chest pain after eating x6  months--being followed by GI for GERD   . Constipation    . Difficulty walking     amb with cane secondary to arthritis bil knees   . Genital herpes     no current outbreak (10/03/15)   . GERD (gastroesophageal reflux disease)    . Hiatal hernia     h/o surgery   . Hyperlipidemia    . Hypertensive disorder     Well controlled on med. Denies any SOB in last 6 months (10/03/15)   . Low back pain    . Morbid obesity with BMI of 40.0-44.9, adult     BMI 40.9   . Nausea without vomiting    . OSA on CPAP 2015    CPAP nightly x 1 yr   . Osteoarthritis of knees, bilateral     and back   . TIA (transient ischemic attack) 2014    TIA 2014-no residual. Patient evaulated for possible TIA 07/12/2015  @ [facility] (dischage summary in epic)-per summary CT of head was normal and patient was d/c'd     Family History   Problem Relation Age of Onset   . Cancer Mother 80        breast cancer   . Breast cancer Mother    . Heart disease  Father    . Malignant hyperthermia Neg Hx    . Pseudochol deficiency Neg Hx      Current Outpatient Medications   Medication Sig Dispense Refill   . acetaminophen (TYLENOL) 160 MG/5ML solution Take 20.3 mLs (650 mg total) by mouth every 6 (six) hours for 5 days 406 mL 0   . albuterol (PROVENTIL HFA;VENTOLIN HFA) 108 (90 BASE) MCG/ACT inhaler Inhale into the lungs every 4 (four) hours as needed.        Marland Kitchen amLODIPine (NORVASC) 5 MG tablet Take 0.5 tablets (2.5 mg total) by mouth daily 90 tablet 3   . aspirin EC 81 MG EC tablet Take 1 tablet (81 mg total) by mouth daily. 30 tablet 1   . fluticasone (FLONASE) 50 MCG/ACT nasal spray 2 sprays by Nasal route daily. (Patient taking differently: 2 sprays by Nasal route as needed.   ) 16 g 2   . Multiple Vitamin (MULTIVITAMIN) capsule Take 1 capsule by mouth daily     .  nystatin (NYSTOP) powder Apply to affected area 3 times daily 60 g 1   . ondansetron (ZOFRAN-ODT) 4 MG disintegrating tablet Take 1 tablet (4 mg total) by mouth every 6 (six) hours as needed for Nausea 20 tablet 0   . oxyCODONE (ROXICODONE) 5 MG immediate release tablet Take 1 tablet (5 mg total) by mouth every 6 (six) hours as needed for Pain 20 tablet 0   . pantoprazole (PROTONIX) 40 MG tablet TAKE 1 TABLET(40 MG) BY MOUTH DAILY 90 tablet 0   . valACYclovir HCL (VALTREX) 500 MG tablet TAKE 1 TABLET(500 MG) BY MOUTH DAILY (Patient taking differently: daily   ) 90 tablet 0     No current facility-administered medications for this visit.           PE:    Vitals:    09/06/18 0913   BP: (!) 158/97   Pulse: 76     Body mass index is 25.13 kg/m.    General:  nad  cv rr  Lung cta  abd s  Ext no le edema      Labs:  Lipid Panel   Cholesterol   Date/Time Value Ref Range Status   08/16/2018 07:34 AM 187 <200 mg/dL Final     Triglycerides   Date/Time Value Ref Range Status   08/16/2018 07:34 AM 61 <150 mg/dL Final   16/08/9603 54:09 AM 26 (L) 34 - 149 mg/dL Final     HDL   Date/Time Value Ref Range Status   08/16/2018  07:34 AM 80 >50 mg/dL Final       CMP:   Sodium   Date/Time Value Ref Range Status   09/02/2018 04:05 AM 141 136 - 145 mEq/L Final     Potassium   Date/Time Value Ref Range Status   09/02/2018 04:05 AM 4.3 3.5 - 5.1 mEq/L Final     Chloride   Date/Time Value Ref Range Status   09/02/2018 04:05 AM 110 100 - 111 mEq/L Final   08/16/2018 07:34 AM 107 98 - 110 mmol/L Final     CO2   Date/Time Value Ref Range Status   09/02/2018 04:05 AM 21 (L) 22 - 29 mEq/L Final     Glucose   Date/Time Value Ref Range Status   09/02/2018 04:05 AM 77 70 - 100 mg/dL Final     Comment:     ADA guidelines for diabetes mellitus:  Fasting:  Equal to or greater than 126 mg/dL  Random:   Equal to or greater than 200 mg/dL       BUN   Date/Time Value Ref Range Status   09/02/2018 04:05 AM 5 (L) 7 - 19 mg/dL Final     Protein, Total   Date/Time Value Ref Range Status   08/31/2018 05:46 PM 7.6 6.0 - 8.3 g/dL Final   81/19/1478 29:56 AM 6.5 6.1 - 8.1 g/dL Final     Alkaline Phosphatase   Date/Time Value Ref Range Status   08/31/2018 05:46 PM 103 37 - 106 U/L Final     AST (SGOT)   Date/Time Value Ref Range Status   08/31/2018 05:46 PM 20 5 - 34 U/L Final     ALT   Date/Time Value Ref Range Status   08/31/2018 05:46 PM 13 0 - 55 U/L Final     Anion Gap   Date/Time Value Ref Range Status   09/02/2018 04:05 AM 10.0 5.0 - 15.0 Final       CBC:   WBC  Date/Time Value Ref Range Status   09/02/2018 04:05 AM 5.70 3.10 - 9.50 x10 3/uL Final   06/30/2009 04:03 PM 9.12 3.50 - 10.80 /CUMM Final     RBC   Date/Time Value Ref Range Status   09/02/2018 04:05 AM 3.74 (L) 3.90 - 5.10 x10 6/uL Final     Hemoglobin   Date/Time Value Ref Range Status   08/16/2018 07:34 AM 12.3 11.7 - 15.5 g/dL Final     Hgb   Date/Time Value Ref Range Status   09/02/2018 04:05 AM 10.4 (L) 11.4 - 14.8 g/dL Final     Hematocrit   Date/Time Value Ref Range Status   09/02/2018 04:05 AM 32.4 (L) 34.7 - 43.7 % Final     MCV   Date/Time Value Ref Range Status   09/02/2018 04:05 AM 86.6  78.0 - 96.0 fL Final     MCHC   Date/Time Value Ref Range Status   09/02/2018 04:05 AM 32.1 31.5 - 35.8 g/dL Final     RDW   Date/Time Value Ref Range Status   09/02/2018 04:05 AM 14 11 - 15 % Final     Platelets   Date/Time Value Ref Range Status   09/02/2018 04:05 AM 222 142 - 346 x10 3/uL Final   08/16/2018 07:34 AM 252 140 - 400 Thousand/uL Final           Impression / plan    HTN   - mildly uncontrolled, but still with post op pain   - continue norvasc 2.5 mg daily    Palpitations   - no event on recent 24 hour holter and normal 30 day event monitor in the past   - will follow      RTC 3 months      Teagan Ozawa Dan Humphreys  09/06/2018

## 2018-09-13 ENCOUNTER — Encounter (INDEPENDENT_AMBULATORY_CARE_PROVIDER_SITE_OTHER): Payer: Self-pay

## 2018-09-14 ENCOUNTER — Encounter (INDEPENDENT_AMBULATORY_CARE_PROVIDER_SITE_OTHER): Payer: Self-pay

## 2018-09-16 ENCOUNTER — Ambulatory Visit (INDEPENDENT_AMBULATORY_CARE_PROVIDER_SITE_OTHER): Payer: Medicare Other | Admitting: Surgery

## 2018-09-16 ENCOUNTER — Encounter (INDEPENDENT_AMBULATORY_CARE_PROVIDER_SITE_OTHER): Payer: Self-pay | Admitting: Surgery

## 2018-09-16 VITALS — BP 152/90 | HR 73 | Temp 97.7°F | Ht 61.0 in | Wt 137.0 lb

## 2018-09-16 DIAGNOSIS — Z9884 Bariatric surgery status: Secondary | ICD-10-CM

## 2018-09-16 DIAGNOSIS — R110 Nausea: Secondary | ICD-10-CM

## 2018-09-16 DIAGNOSIS — R10816 Epigastric abdominal tenderness: Secondary | ICD-10-CM

## 2018-09-16 NOTE — Progress Notes (Signed)
Drs. Fermin Schwab, [place], and Pourshojae   [address]   [place], [place] [address]   [phone]   [phone]     [address], [address]   [place], [place] [address]   [phone]   [phone]  Dear Dr. Daivd Council,    Ms. Justiss is s/p Lap LOA due to small bowel obstruction from adhesive disease.  Initially, she felt well.  More recently she is having discomfort which is different from prior to surgery.  The pain usually occurs after meals about 30 minutes later.  She has some nausea but no vomiting.  The pain is located in the epigastrium, RUQ, and back.  No changes in bowel habit, no diarrhea, no fevers/chills.    CURRENT PROBLEM LIST:   Patient Active Problem List   Diagnosis   . Essential hypertension   . Anxiety state, unspecified   . Abnormal electrocardiogram   . Cerebral infarction   . Gastroesophageal reflux disease   . Hypernatremia   . Paresthesia   . Slow transit constipation   . Vitamin D deficiency   . Overactive bladder   . S/P laparoscopic cholecystectomy   . Transient cerebral ischemia, unspecified type   . Palpitations   . Pituitary microadenoma   . Dyslipidemia   . Other long term (current) drug therapy   . Headache   . History of spinal surgery   . Obstructive sleep apnea syndrome   . Hiatal hernia   . Osteoarthritis of knees, bilateral   . Bariatric surgery status   . Nutritional deficiency   . Anxiety   . Epigastric abdominal tenderness without rebound tenderness   . Encounter for vitamin deficiency screening   . Nausea   . Other dysphagia   . Small bowel obstruction due to adhesions     PAST MEDICAL HISTORY:   Past Medical History:   Diagnosis Date   . Asthma    . Chest pain 2016     Chest pain after eating x6  months--being followed by GI for GERD   . Constipation    . Difficulty walking     amb with cane secondary to arthritis bil knees   . Genital herpes     no current outbreak (10/03/15)   . GERD (gastroesophageal reflux disease)    . Hiatal hernia      h/o surgery   . Hyperlipidemia    . Hypertensive disorder     Well controlled on med. Denies any SOB in last 6 months (10/03/15)   . Low back pain    . Morbid obesity with BMI of 40.0-44.9, adult     BMI 40.9   . Nausea without vomiting    . OSA on CPAP 2015    CPAP nightly x 1 yr   . Osteoarthritis of knees, bilateral     and back   . TIA (transient ischemic attack) 2014    TIA 2014-no residual. Patient evaulated for possible TIA 07/12/2015  @ [facility] (dischage summary in epic)-per summary CT of head was normal and patient was d/c'd     PAST SURGICAL HISTORY:   Past Surgical History:   Procedure Laterality Date   . APPENDECTOMY  >25 yrs   . CHOLECYSTECTOMY     . EGD  01/2015   . EGD, BIOPSY N/A 09/15/2017    Procedure: EGD, BIOPSY;  Surgeon: Pershing Proud, MD;  Location: [facility];  Service: Gastroenterology;  Laterality: N/A;   . fallopian  tube surgery  >25 yrs   . INSERTION, PAIN PUMP (MEDICAL) N/A 10/22/2015    Procedure: INSERTION, PAIN PUMP (MEDICAL);  Surgeon: Nicola Police, DO;  Location: [facility];  Service: General;  Laterality: N/A;   . LAPAROSCOPIC, CHOLECYSTECTOMY, CHOLANGIOGRAM  08/13/2014   . LAPAROSCOPIC, GASTRIC BYPASS N/A 10/22/2015    Procedure: LAPAROSCOPIC, GASTRIC BYPASS;  Surgeon: Jeanell Sparrow, Hamid R, DO;  Location: [facility];  Service: General;  Laterality: N/A;   . LAPAROSCOPIC, HERNIORRHAPHY, HIATAL N/A 10/22/2015    Procedure: LAPAROSCOPIC, HERNIORRHAPHY, HIATAL;  Surgeon: Nicola Police, DO;  Location: [facility];  Service: General;  Laterality: N/A;   . LAPAROSCOPIC, LYSIS, ADHESIONS N/A 09/01/2018    Procedure: LAPAROSCOPIC, LYSIS, ADHESIONS RELEASE OF  SMALL BOWEL OBSTRUCTION;  Surgeon: Champ Mungo, MD;  Location: [facility];  Service: General;  Laterality: N/A;   . LAPAROSCOPY, DIAGNOSTIC N/A 09/01/2018    Procedure: LAPAROSCOPY, DIAGNOSTIC;  Surgeon: Champ Mungo, MD;  Location:  MAIN OR;  Service: General;  Laterality: N/A;   .  OVARY SURGERY Right >25 yrs   . SPINE SURGERY  11/2013    cervical HNP x 2, Dr. Bufford Buttner     CURRENT OUTPATIENT MEDICATIONS:   Outpatient Medications Marked as Taking for the 09/16/18 encounter (Office Visit) with Champ Mungo, MD   Medication Sig Dispense Refill   . albuterol (PROVENTIL HFA;VENTOLIN HFA) 108 (90 BASE) MCG/ACT inhaler Inhale into the lungs every 4 (four) hours as needed.        Marland Kitchen amLODIPine (NORVASC) 5 MG tablet Take 0.5 tablets (2.5 mg total) by mouth daily 90 tablet 3   . aspirin EC 81 MG EC tablet Take 1 tablet (81 mg total) by mouth daily. 30 tablet 1   . fluticasone (FLONASE) 50 MCG/ACT nasal spray 2 sprays by Nasal route daily. (Patient taking differently: 2 sprays by Nasal route as needed.   ) 16 g 2   . Multiple Vitamin (MULTIVITAMIN) capsule Take 1 capsule by mouth daily     . nystatin (NYSTOP) powder Apply to affected area 3 times daily 60 g 1   . ondansetron (ZOFRAN-ODT) 4 MG disintegrating tablet Take 1 tablet (4 mg total) by mouth every 6 (six) hours as needed for Nausea 20 tablet 0   . pantoprazole (PROTONIX) 40 MG tablet TAKE 1 TABLET(40 MG) BY MOUTH DAILY 90 tablet 0   . valACYclovir HCL (VALTREX) 500 MG tablet TAKE 1 TABLET(500 MG) BY MOUTH DAILY (Patient taking differently: daily   ) 90 tablet 0     ALLERGIES:   Allergies   Allergen Reactions   . Acyclovir Nausea And Vomiting     Other reaction(s): gi distress   . Amlodipine      nausea   . Amoxicillin-Pot Clavulanate      Other reaction(s): gi distress     . Aspirin Nausea And Vomiting     Patient states "it upsets my stomach"  Other reaction(s): gi distress  Upsets stomach  Able to tolerate enteric coated aspirin   . Atorvastatin        Palpitation   . Carvedilol      Extreme fatigue   . Lisinopril      nausea   . Lovastatin Nausea And Vomiting     Other reaction(s): gi distress   . Metronidazole Nausea And Vomiting     Other reaction(s): gi distress   . Moxifloxacin Nausea And Vomiting  GI symptoms   . Other Nausea And  Vomiting   . Rosuvastatin      Palpitation, chest pain, abd pain    . Statins    . Sulfa Antibiotics Nausea And Vomiting     Other reaction(s): gi distress      Social History     Tobacco Use   . Smoking status: Never Smoker   . Smokeless tobacco: Never Used   Substance Use Topics   . Alcohol use: No      Family History   Problem Relation Age of Onset   . Cancer Mother 11        breast cancer   . Breast cancer Mother    . Heart disease Father    . Malignant hyperthermia Neg Hx    . Pseudochol deficiency Neg Hx         Physical Exam:W/ female chaperone present during entire examination:  BP 152/90 (BP Site: Right arm, Patient Position: Sitting)   Pulse 73   Temp 97.7 F (36.5 C) (Oral)   Ht 5\' 1"    Wt 137 lb   BMI 25.89 kg/m     General appearance: alert, appears stated age and cooperative  Head: Normocephalic, without obvious abnormality, atraumatic  Eyes: negative findings: conjunctivae and sclerae normal  Abdomen: soft, non-tender; bowel sounds normal; no masses,  no organomegaly and incisions well healed      Assessment:  S/p Lap LOA  RUQ/Epigastric pain  H/O hiatal hernia    Plan:  Will monitory for now.  F/u 4 weeks.  If persistent, she may need repair of the hiatal hernia.    Thank you for allowing me to participate in their care.      Tommye Standard, MD, FACS

## 2018-09-21 ENCOUNTER — Encounter (INDEPENDENT_AMBULATORY_CARE_PROVIDER_SITE_OTHER): Payer: Self-pay

## 2018-09-22 ENCOUNTER — Telehealth (INDEPENDENT_AMBULATORY_CARE_PROVIDER_SITE_OTHER): Payer: Self-pay

## 2018-09-22 NOTE — Telephone Encounter (Signed)
Pt called with c/o constipation- she is using miralax daily with no relief. We discussed trying a suppository and drinking some prune juice which pt agreed to try. She will call back if this does not work.

## 2018-10-10 ENCOUNTER — Encounter (INDEPENDENT_AMBULATORY_CARE_PROVIDER_SITE_OTHER): Payer: Self-pay

## 2018-10-18 ENCOUNTER — Ambulatory Visit (INDEPENDENT_AMBULATORY_CARE_PROVIDER_SITE_OTHER): Payer: Medicare Other | Admitting: Surgery

## 2018-10-18 ENCOUNTER — Encounter (INDEPENDENT_AMBULATORY_CARE_PROVIDER_SITE_OTHER): Payer: Self-pay | Admitting: Surgery

## 2018-10-18 VITALS — BP 151/86 | HR 75 | Temp 97.9°F | Ht 61.0 in | Wt 133.0 lb

## 2018-10-18 DIAGNOSIS — K449 Diaphragmatic hernia without obstruction or gangrene: Secondary | ICD-10-CM

## 2018-10-18 DIAGNOSIS — K565 Intestinal adhesions [bands], unspecified as to partial versus complete obstruction: Secondary | ICD-10-CM

## 2018-10-18 NOTE — Progress Notes (Signed)
Drs. Fermin Schwab, [place], and Pourshojae   [address], [address]   [place], [place] [address]   [phone]   [phone]     [address]   [place], [place] [address]   [phone]   [phone]    Amber [name] is s/p lap LOA causing obstruction.  Her last appt she was c/o nausea and pain. This is resolved.  Now, she has intermittent epigastric pain and dysphagia with certain foods.  Her last EGD did reveal 2cm sliding hiatal hernia which may contribute to some her of her issues.  She is having BM's but usually needs Miralax PRN.    CURRENT PROBLEM LIST:   Patient Active Problem List   Diagnosis   . Essential hypertension   . Anxiety state, unspecified   . Abnormal electrocardiogram   . Cerebral infarction   . Gastroesophageal reflux disease   . Hypernatremia   . Paresthesia   . Slow transit constipation   . Vitamin D deficiency   . Overactive bladder   . S/P laparoscopic cholecystectomy   . Transient cerebral ischemia, unspecified type   . Palpitations   . Pituitary microadenoma   . Dyslipidemia   . Other long term (current) drug therapy   . Headache   . History of spinal surgery   . Obstructive sleep apnea syndrome   . Hiatal hernia   . Osteoarthritis of knees, bilateral   . Bariatric surgery status   . Nutritional deficiency   . Anxiety   . Epigastric abdominal tenderness without rebound tenderness   . Encounter for vitamin deficiency screening   . Nausea   . Other dysphagia   . Small bowel obstruction due to adhesions     PAST MEDICAL HISTORY:   Past Medical History:   Diagnosis Date   . Asthma    . Chest pain 2016     Chest pain after eating x6  months--being followed by GI for GERD   . Constipation    . Difficulty walking     amb with cane secondary to arthritis bil knees   . Genital herpes     no current outbreak (10/03/15)   . GERD (gastroesophageal reflux disease)    . Hiatal hernia     h/o surgery   . Hyperlipidemia    . Hypertensive disorder     Well controlled on  med. Denies any SOB in last 6 months (10/03/15)   . Low back pain    . Morbid obesity with BMI of 40.0-44.9, adult     BMI 40.9   . Nausea without vomiting    . OSA on CPAP 2015    CPAP nightly x 1 yr   . Osteoarthritis of knees, bilateral     and back   . TIA (transient ischemic attack) 2014    TIA 2014-no residual. Patient evaulated for possible TIA 07/12/2015  @ [facility] (dischage summary in epic)-per summary CT of head was normal and patient was d/c'd     PAST SURGICAL HISTORY:   Past Surgical History:   Procedure Laterality Date   . APPENDECTOMY  >25 yrs   . CHOLECYSTECTOMY     . EGD  01/2015   . EGD, BIOPSY N/A 09/15/2017    Procedure: EGD, BIOPSY;  Surgeon: Pershing Proud, MD;  Location: [facility];  Service: Gastroenterology;  Laterality: N/A;   . fallopian tube surgery  >25 yrs   . INSERTION, PAIN PUMP (MEDICAL) N/A 10/22/2015  Procedure: INSERTION, PAIN PUMP (MEDICAL);  Surgeon: Nicola Police, DO;  Location: [facility];  Service: General;  Laterality: N/A;   . LAPAROSCOPIC, CHOLECYSTECTOMY, CHOLANGIOGRAM  08/13/2014   . LAPAROSCOPIC, GASTRIC BYPASS N/A 10/22/2015    Procedure: LAPAROSCOPIC, GASTRIC BYPASS;  Surgeon: Jeanell Sparrow, Hamid R, DO;  Location: [facility];  Service: General;  Laterality: N/A;   . LAPAROSCOPIC, HERNIORRHAPHY, HIATAL N/A 10/22/2015    Procedure: LAPAROSCOPIC, HERNIORRHAPHY, HIATAL;  Surgeon: Nicola Police, DO;  Location: [facility];  Service: General;  Laterality: N/A;   . LAPAROSCOPIC, LYSIS, ADHESIONS N/A 09/01/2018    Procedure: LAPAROSCOPIC, LYSIS, ADHESIONS RELEASE OF  SMALL BOWEL OBSTRUCTION;  Surgeon: Champ Mungo, MD;  Location: [facility];  Service: General;  Laterality: N/A;   . LAPAROSCOPY, DIAGNOSTIC N/A 09/01/2018    Procedure: LAPAROSCOPY, DIAGNOSTIC;  Surgeon: Champ Mungo, MD;  Location: [facility];  Service: General;  Laterality: N/A;   . OVARY SURGERY Right >25 yrs   . SPINE SURGERY  11/2013    cervical HNP x 2, Dr.  Bufford Buttner     CURRENT OUTPATIENT MEDICATIONS:   Outpatient Medications Marked as Taking for the 10/18/18 encounter (Office Visit) with Champ Mungo, MD   Medication Sig Dispense Refill   . albuterol (PROVENTIL HFA;VENTOLIN HFA) 108 (90 BASE) MCG/ACT inhaler Inhale into the lungs every 4 (four) hours as needed.        Marland Kitchen amLODIPine (NORVASC) 5 MG tablet Take 0.5 tablets (2.5 mg total) by mouth daily 90 tablet 3   . aspirin EC 81 MG EC tablet Take 1 tablet (81 mg total) by mouth daily. 30 tablet 1   . fluticasone (FLONASE) 50 MCG/ACT nasal spray 2 sprays by Nasal route daily. (Patient taking differently: 2 sprays by Nasal route as needed.   ) 16 g 2   . Multiple Vitamin (MULTIVITAMIN) capsule Take 1 capsule by mouth daily     . nystatin (NYSTOP) powder Apply to affected area 3 times daily 60 g 1   . ondansetron (ZOFRAN-ODT) 4 MG disintegrating tablet Take 1 tablet (4 mg total) by mouth every 6 (six) hours as needed for Nausea 20 tablet 0   . pantoprazole (PROTONIX) 40 MG tablet TAKE 1 TABLET(40 MG) BY MOUTH DAILY 90 tablet 0   . valACYclovir HCL (VALTREX) 500 MG tablet TAKE 1 TABLET(500 MG) BY MOUTH DAILY (Patient taking differently: daily   ) 90 tablet 0     ALLERGIES:   Allergies   Allergen Reactions   . Acyclovir Nausea And Vomiting     Other reaction(s): gi distress   . Amlodipine      nausea   . Amoxicillin-Pot Clavulanate      Other reaction(s): gi distress     . Aspirin Nausea And Vomiting     Patient states "it upsets my stomach"  Other reaction(s): gi distress  Upsets stomach  Able to tolerate enteric coated aspirin   . Atorvastatin        Palpitation   . Carvedilol      Extreme fatigue   . Lisinopril      nausea   . Lovastatin Nausea And Vomiting     Other reaction(s): gi distress   . Metronidazole Nausea And Vomiting     Other reaction(s): gi distress   . Moxifloxacin Nausea And Vomiting         GI symptoms   . Other Nausea And Vomiting   . Rosuvastatin  Palpitation, chest pain, abd pain    . Statins    .  Sulfa Antibiotics Nausea And Vomiting     Other reaction(s): gi distress      Social History     Tobacco Use   . Smoking status: Never Smoker   . Smokeless tobacco: Never Used   Substance Use Topics   . Alcohol use: No      Family History   Problem Relation Age of Onset   . Cancer Mother 34        breast cancer   . Breast cancer Mother    . Heart disease Father    . Malignant hyperthermia Neg Hx    . Pseudochol deficiency Neg Hx         Physical Exam:W/ female chaperone present during entire examination:  BP 151/86   Pulse 75   Temp 97.9 F (36.6 C) (Oral)   Ht 5\' 1"    Wt 133 lb   BMI 25.13 kg/m     General appearance: alert, appears stated age and cooperative  Head: Normocephalic, without obvious abnormality, atraumatic  Abdomen: soft, non-tender; bowel sounds normal; no masses,  no organomegaly and incisions well healed      Assessment:  S/p LOA  Hiatal hernia    Plan:  S/p Lap LOA--she is doing better.  I recommend daily Miralax for her constipation.  She will f/u in 2-3 months with Dr. Jeanell Sparrow for ongoing care post bypass.        Tommye Standard, MD, FACS

## 2018-10-19 ENCOUNTER — Encounter (INDEPENDENT_AMBULATORY_CARE_PROVIDER_SITE_OTHER): Payer: Self-pay

## 2018-10-22 ENCOUNTER — Emergency Department
Admission: EM | Admit: 2018-10-22 | Discharge: 2018-10-22 | Disposition: A | Discharge status: Home / Self Care | Payer: Medicare Other | Attending: General Practice | Admitting: General Practice

## 2018-10-22 ENCOUNTER — Emergency Department: Payer: Medicare Other

## 2018-10-22 DIAGNOSIS — I10 Essential (primary) hypertension: Secondary | ICD-10-CM | POA: Insufficient documentation

## 2018-10-22 DIAGNOSIS — S32010A Wedge compression fracture of first lumbar vertebra, initial encounter for closed fracture: Secondary | ICD-10-CM

## 2018-10-22 DIAGNOSIS — Z7982 Long term (current) use of aspirin: Secondary | ICD-10-CM | POA: Insufficient documentation

## 2018-10-22 DIAGNOSIS — W1830XA Fall on same level, unspecified, initial encounter: Secondary | ICD-10-CM | POA: Insufficient documentation

## 2018-10-22 DIAGNOSIS — E785 Hyperlipidemia, unspecified: Secondary | ICD-10-CM | POA: Insufficient documentation

## 2018-10-22 DIAGNOSIS — M545 Low back pain, unspecified: Secondary | ICD-10-CM

## 2018-10-22 DIAGNOSIS — S32019A Unspecified fracture of first lumbar vertebra, initial encounter for closed fracture: Secondary | ICD-10-CM | POA: Insufficient documentation

## 2018-10-22 DIAGNOSIS — W19XXXA Unspecified fall, initial encounter: Secondary | ICD-10-CM

## 2018-10-22 DIAGNOSIS — Z8673 Personal history of transient ischemic attack (TIA), and cerebral infarction without residual deficits: Secondary | ICD-10-CM | POA: Insufficient documentation

## 2018-10-22 MED ORDER — TRAMADOL HCL 50 MG PO TABS
50.0000 mg | ORAL_TABLET | Freq: Two times a day (BID) | ORAL | 0 refills | Status: DC | PRN
Start: 2018-10-22 — End: 2018-10-27

## 2018-10-22 MED ORDER — ACETAMINOPHEN 500 MG PO TABS
1000.0000 mg | ORAL_TABLET | Freq: Once | ORAL | Status: AC
Start: 2018-10-22 — End: 2018-10-22
  Administered 2018-10-22: 10:00:00 1000 mg via ORAL
  Filled 2018-10-22: qty 2

## 2018-10-22 MED ORDER — ACETAMINOPHEN 325 MG PO TABS
650.0000 mg | ORAL_TABLET | ORAL | 0 refills | Status: DC | PRN
Start: 2018-10-22 — End: 2018-11-28

## 2018-10-22 NOTE — ED Provider Notes (Signed)
EMERGENCY DEPARTMENT NOTE    Physician/Midlevel provider first contact with patient: 10/22/18 0950         HISTORY OF PRESENT ILLNESS   Historian:Patient  Translator Used: No    Chief Complaint: Back Pain     Mechanism of Injury: Nursing (triage) note reviewed for the following pertinent information:  lower back pain s/p fall 1 week ago      67 y.o. female presents to ED with low back pain S/P fall.  Patient states 1 week ago she fell backwards from standing onto her bottom.  Since incident she has had pain in her low back with sitting and getting up from the seated position.  Denies hitting her head or LOC.  Denies motor or sensory changes.  States she has tried tylenol and a heating pad with no improvement in discomfort.      1. Location of symptoms: low back  2. Onset of symptoms: 1 week ago  3. What was patient doing when symptoms started (Context): see above  4. Severity: 10/10  5. Timing: constant  6. Activities that worsen symptoms: movement or sitting for too long  7. Activities that improve symptoms: none  8. Quality: ache/sharp  9. Radiation of symptoms: no  10. Associated signs and Symptoms: see above  11. Are symptoms worsening? no  MEDICAL HISTORY     Past Medical History:  Past Medical History:   Diagnosis Date   . Asthma    . Chest pain 2016     Chest pain after eating x6  months--being followed by GI for GERD   . Constipation    . Difficulty walking     amb with cane secondary to arthritis bil knees   . Genital herpes     no current outbreak (10/03/15)   . GERD (gastroesophageal reflux disease)    . Hiatal hernia     h/o surgery   . Hyperlipidemia    . Hypertensive disorder     Well controlled on med. Denies any SOB in last 6 months (10/03/15)   . Low back pain    . Morbid obesity with BMI of 40.0-44.9, adult     BMI 40.9   . Nausea without vomiting    . OSA on CPAP 2015    CPAP nightly x 1 yr   . Osteoarthritis of knees, bilateral     and back   . TIA (transient ischemic attack) 2014    TIA 2014-no  residual. Patient evaulated for possible TIA 07/12/2015  @ [facility] (dischage summary in epic)-per summary CT of head was normal and patient was d/c'd       Past Surgical History:  Past Surgical History:   Procedure Laterality Date   . APPENDECTOMY  >25 yrs   . CHOLECYSTECTOMY     . EGD  01/2015   . EGD, BIOPSY N/A 09/15/2017    Procedure: EGD, BIOPSY;  Surgeon: Pershing Proud, MD;  Location: [facility];  Service: Gastroenterology;  Laterality: N/A;   . fallopian tube surgery  >25 yrs   . INSERTION, PAIN PUMP (MEDICAL) N/A 10/22/2015    Procedure: INSERTION, PAIN PUMP (MEDICAL);  Surgeon: Nicola Police, DO;  Location: [facility];  Service: General;  Laterality: N/A;   . LAPAROSCOPIC, CHOLECYSTECTOMY, CHOLANGIOGRAM  08/13/2014   . LAPAROSCOPIC, GASTRIC BYPASS N/A 10/22/2015    Procedure: LAPAROSCOPIC, GASTRIC BYPASS;  Surgeon: Josefa Half R, DO;  Location: [facility];  Service: General;  Laterality: N/A;   .  LAPAROSCOPIC, HERNIORRHAPHY, HIATAL N/A 10/22/2015    Procedure: LAPAROSCOPIC, HERNIORRHAPHY, HIATAL;  Surgeon: Nicola Police, DO;  Location: [facility];  Service: General;  Laterality: N/A;   . LAPAROSCOPIC, LYSIS, ADHESIONS N/A 09/01/2018    Procedure: LAPAROSCOPIC, LYSIS, ADHESIONS RELEASE OF  SMALL BOWEL OBSTRUCTION;  Surgeon: Champ Mungo, MD;  Location: [facility];  Service: General;  Laterality: N/A;   . LAPAROSCOPY, DIAGNOSTIC N/A 09/01/2018    Procedure: LAPAROSCOPY, DIAGNOSTIC;  Surgeon: Champ Mungo, MD;  Location: [facility];  Service: General;  Laterality: N/A;   . OVARY SURGERY Right >25 yrs   . SPINE SURGERY  11/2013    cervical HNP x 2, Dr. Bufford Buttner       Social History:  Social History     Socioeconomic History   . Marital status: Single     Spouse name: Not on file   . Number of children: 2   . Years of education: Not on file   . Highest education level: Not on file   Occupational History   . Occupation: Acupuncturist:  [employer]    Social Needs   . Financial resource strain: Not on file   . Food insecurity:     Worry: Not on file     Inability: Not on file   . Transportation needs:     Medical: Not on file     Non-medical: Not on file   Tobacco Use   . Smoking status: Never Smoker   . Smokeless tobacco: Never Used   Substance and Sexual Activity   . Alcohol use: No   . Drug use: No   . Sexual activity: Never   Lifestyle   . Physical activity:     Days per week: Not on file     Minutes per session: Not on file   . Stress: Not on file   Relationships   . Social connections:     Talks on phone: Not on file     Gets together: Not on file     Attends religious service: Not on file     Active member of club or organization: Not on file     Attends meetings of clubs or organizations: Not on file     Relationship status: Not on file   . Intimate partner violence:     Fear of current or ex partner: Not on file     Emotionally abused: Not on file     Physically abused: Not on file     Forced sexual activity: Not on file   Other Topics Concern   . Dietary supplements / vitamins Yes   . Anesthesia problems No   . Blood thinners No   . Pregnant Not Asked   . Future Children Not Asked   . Number of Pregnancies? Not Asked   . Number of children Not Asked   . Miscarriages / Abortions? Not Asked   . Eats large amounts No   . Excessive Sweets No   . Skips meals No   . Eats excessive starches Yes   . Snacks or grazes No   . Emotional eater Yes   . Eats fried food Yes   . Eats fast food Yes   . Diet Center No   . HMR No   . [name] No   . [place] Weight Loss No   . Nutri-System No   . Opti-Fast /  Medi-Fast No   . Overeaters Anonymous No   . Physicians Weight Loss Center No   . TOPS No   . Weight Watchers No   . Atkins No   . Binging / Purging No   . Body for Life No   . Cabbage Soup No   . Calorie Counting No   . Fasting No   . [name] No   . Health Spa No   . Herbal Life No   . High Protein No   . Low Carb No   . Low Fat No   .  [facility] Diet No   . [place] Diet No   . Margie Billet Diet No   . [place] Diet No   . Slim Fast No   . [place] No   . Sugar Busters No   . Vomiting No   . Zone Diet No   . Stationary cycle or treadmill No   . Gym/fitness Classes No   . Home exercise/video No   . Swimming No   . Team sports No   . Weight training No   . Walking or running No   . Hospitalization No   . Hypnosis No   . Physical therapy No   . Psychological therapy No   . Residential program No   . Acutrim No   . Amphetamines No   . Anorex No   . Byetta No   . Dexatrim No   . Didrex No   . Fastin No   . Fen - Phen No   . Ionamin / Adipex No   . Mazanor No   . Meridia No   . Obalan No   . Phendiet No   . Phentrol No   . Phenteramine Yes   . Plegine No   . Pondimin No   . Qsymia No   . Prozac No   . Redux No   . Sanorex No   . Tenuate No   . Tepanole No   . Wechless No   . Wellbutrin No   . Xenical (Orlistat, Alli) No   . Other Med No   . No impairment Yes     Comment: Chronic Bilat Knee Pain   . Walks with cane/crutch Yes   . Requires a wheelchair No   . Bedridden No   . Are you currently being treated for depression? No   . Do you snore? Yes   . Are you receiving any medical or psychological services? No   . Do you ever wake up at night gasping for breath? Not Asked   . Do you have or have you been treated for an eating disorder? No   . Anyone ever told you that you stop breathing while asleep? Not Asked   . Do you exercise regularly? No   . Have you or family member ever have trouble with anesthesia? No   Social History Narrative   . Not on file       Family History:  Family History   Problem Relation Age of Onset   . Cancer Mother 83        breast cancer   . Breast cancer Mother    . Heart disease Father    . Malignant hyperthermia Neg Hx    . Pseudochol deficiency Neg Hx        Outpatient Medication:  Discharge Medication List as of 10/22/2018 12:14 PM      CONTINUE these medications  which have NOT CHANGED    Details   albuterol  (PROVENTIL HFA;VENTOLIN HFA) 108 (90 BASE) MCG/ACT inhaler Inhale into the lungs every 4 (four) hours as needed.   , Starting 01/26/2012, Until Discontinued, Historical Med      amLODIPine (NORVASC) 5 MG tablet Take 0.5 tablets (2.5 mg total) by mouth daily, Starting Tue 08/09/2018, Normal      aspirin EC 81 MG EC tablet Take 1 tablet (81 mg total) by mouth daily., Starting Tue 01/25/2018, Normal      fluticasone (FLONASE) 50 MCG/ACT nasal spray 2 sprays by Nasal route daily., Starting 01/02/2014, Until Discontinued, Normal      Multiple Vitamin (MULTIVITAMIN) capsule Take 1 capsule by mouth daily, Historical Med      nystatin (NYSTOP) powder Apply to affected area 3 times daily, Normal      ondansetron (ZOFRAN-ODT) 4 MG disintegrating tablet Take 1 tablet (4 mg total) by mouth every 6 (six) hours as needed for Nausea, Starting Fri 09/02/2018, Normal      pantoprazole (PROTONIX) 40 MG tablet TAKE 1 TABLET(40 MG) BY MOUTH DAILY, Normal      valACYclovir HCL (VALTREX) 500 MG tablet TAKE 1 TABLET(500 MG) BY MOUTH DAILY, Normal               REVIEW OF SYSTEMS   Review of Systems   Musculoskeletal: Positive for back pain and falls.   All other systems reviewed and are negative.           PHYSICAL EXAM     ED Triage Vitals [10/22/18 0954]   Enc Vitals Group      BP 156/77      Heart Rate 85      Resp Rate 16      Temp 97.8 F (36.6 C)      Temp Source Oral      SpO2 98 %      Weight 59.9 kg      Height 1.549 m      Head Circumference       Peak Flow       Pain Score 10      Pain Loc       Pain Edu?       Excl. in GC?    Physical Exam  Vitals signs and nursing note reviewed.   Constitutional:       General: She is not in acute distress.     Appearance: Normal appearance. She is normal weight. She is not ill-appearing, toxic-appearing or diaphoretic.   HENT:      Head: Normocephalic and atraumatic.      Mouth/Throat:      Mouth: Mucous membranes are moist.   Eyes:      Extraocular Movements: Extraocular movements intact.       Conjunctiva/sclera: Conjunctivae normal.   Neck:      Musculoskeletal: Normal range of motion.   Cardiovascular:      Rate and Rhythm: Normal rate.      Pulses: Normal pulses.   Pulmonary:      Effort: Pulmonary effort is normal.   Musculoskeletal: Normal range of motion.         General: Tenderness present. No swelling, deformity or signs of injury.      Right lower leg: No edema.      Left lower leg: No edema.      Comments: +lumbar and sacral spine TTP, no step off or deformities    Skin:  General: Skin is warm.      Capillary Refill: Capillary refill takes less than 2 seconds.   Neurological:      General: No focal deficit present.      Mental Status: She is alert.      Sensory: No sensory deficit.      Motor: No weakness.      Gait: Gait normal.   Psychiatric:         Mood and Affect: Mood normal.             MEDICAL DECISION MAKING     DISCUSSION    DDX includes but not limited to muscle strain, compression fracture, hairline pelvic fracture.  Discomfort treated in ED with Tylenol pending imaging.      X-ray consistent with compression fracture of L1    Patient counseled on fracture, cause, and treatment options.  Comfortable with plan for symptomatic treatment and follow up.  Return precautions given and all questions answered.          Vital Signs: Reviewed the patient?s vital signs.   Nursing Notes: Reviewed and utilized available nursing notes.  Medical Records Reviewed: Reviewed available past medical records.  Counseling: The emergency provider has spoken with the patient and discussed today?s findings, in addition to providing specific details for the plan of care.  Questions are answered and there is agreement with the plan.      MIPS DOCUMENTATION        CARDIAC STUDIES    The following cardiac studies were independently interpreted by the Emergency Medicine Physician.  For full cardiac study results please see chart.    Monitor Strip  Interpreted by ED Physician  Rate: 85  Rhythm: NSR   ST  Changes: none        EMERGENCY IMAGING STUDIES    The following imagine studies were independently interpreted by me (emergency physician):      RADIOLOGY IMAGING STUDIES      Lumbar Spine AP and Lateral   Final Result    Compression fracture of L1 resulting in mild loss of   vertebral body height, new since 08/31/2018.      Other findings as above.      Gustavus Messing, MD    10/22/2018 11:30 AM      XR Pelvis Limited   Final Result    No acute fracture or dislocation.      Gustavus Messing, MD    10/22/2018 11:26 AM        Lumbar Spine AP and Lateral   Final Result    Compression fracture of L1 resulting in mild loss of   vertebral body height, new since 08/31/2018.      Other findings as above.      Gustavus Messing, MD    10/22/2018 11:30 AM      XR Pelvis Limited   Final Result    No acute fracture or dislocation.      Gustavus Messing, MD    10/22/2018 11:26 AM                PULSE OXIMETRY    Oxygen Saturation by Pulse Oximetry: 99%  Interventions: none  Interpretation:  Normal     EMERGENCY DEPT. MEDICATIONS      ED Medication Orders (From admission, onward)    Start Ordered     Status Ordering Provider    10/22/18 1013 10/22/18 1012  acetaminophen (TYLENOL) tablet 1,000 mg  Once  Route: Oral  Ordered Dose: 1,000 mg     Last MAR action:  Given Ilianna Bown L          LABORATORY RESULTS    Ordered and independently interpreted AVAILABLE laboratory tests. Please see results section in chart for full details.  Results for orders placed or performed during the hospital encounter of 09/01/18   POTASSIUM WHOLE BLOOD (for use at [facility])   Result Value Ref Range    Potassium Whole Blood 4.1 3.6 - 5.0 mEq/L   Basic metabolic panel (QAM x 1)   Result Value Ref Range    Glucose 77 70 - 100 mg/dL    BUN 5 (L) 7 - 19 mg/dL    Creatinine 0.5 (L) 0.6 - 1.0 mg/dL    Calcium 8.9 8.5 - 91.4 mg/dL    Sodium 782 956 - 213 mEq/L    Potassium 4.3 3.5 - 5.1 mEq/L    Chloride 110 100 - 111 mEq/L    CO2 21 (L) 22 - 29 mEq/L    Anion Gap 10.0 5.0 -  15.0   CBC and differential (QAM x 1)   Result Value Ref Range    WBC 5.70 3.10 - 9.50 x10 3/uL    Hgb 10.4 (L) 11.4 - 14.8 g/dL    Hematocrit 08.6 (L) 34.7 - 43.7 %    Platelets 222 142 - 346 x10 3/uL    RBC 3.74 (L) 3.90 - 5.10 x10 6/uL    MCV 86.6 78.0 - 96.0 fL    MCH 27.8 25.1 - 33.5 pg    MCHC 32.1 31.5 - 35.8 g/dL    RDW 14 11 - 15 %    MPV 10.3 8.9 - 12.5 fL    Neutrophils 71.8 None %    Lymphocytes Automated 15.4 None %    Monocytes 11.8 None %    Eosinophils Automated 0.2 None %    Basophils Automated 0.4 None %    Immature Granulocyte 0.4 None %    Nucleated RBC 0.0 0.0 - 0.0 /100 WBC    Neutrophils Absolute 4.10 1.10 - 6.33 x10 3/uL    Abs Lymph Automated 0.88 0.42 - 3.22 x10 3/uL    Abs Mono Automated 0.67 0.21 - 0.85 x10 3/uL    Abs Eos Automated 0.01 0.00 - 0.44 x10 3/uL    Absolute Baso Automated 0.02 0.00 - 0.08 x10 3/uL    Absolute Immature Granulocyte 0.02 0.00 - 0.07 x10 3/uL    Absolute NRBC 0.00 0.00 - 0.00 x10 3/uL   GFR   Result Value Ref Range    EGFR >60.0        CRITICAL CARE/PROCEDURES    Procedures      DIAGNOSIS      Diagnosis:  Final diagnoses:   Acute midline low back pain without sciatica   Closed compression fracture of body of L1 vertebra   Fall from standing, initial encounter       Disposition:  ED Disposition     ED Disposition Condition Date/Time Comment    Discharge  Sat Oct 22, 2018 12:12 PM [name] discharge to home/self care.    Condition at disposition: Stable          Prescriptions:  Discharge Medication List as of 10/22/2018 12:14 PM      START taking these medications    Details   acetaminophen (TYLENOL) 325 MG tablet Take 2 tablets (650 mg total) by mouth every 4 (four) hours as  needed for Pain, Starting Sat 10/22/2018, Print      traMADol (ULTRAM) 50 MG tablet Take 1 tablet (50 mg total) by mouth 2 (two) times daily as needed for Pain (breakthrough pain), Starting Sat 10/22/2018, Print         CONTINUE these medications which have NOT CHANGED    Details    albuterol (PROVENTIL HFA;VENTOLIN HFA) 108 (90 BASE) MCG/ACT inhaler Inhale into the lungs every 4 (four) hours as needed.   , Starting 01/26/2012, Until Discontinued, Historical Med      amLODIPine (NORVASC) 5 MG tablet Take 0.5 tablets (2.5 mg total) by mouth daily, Starting Tue 08/09/2018, Normal      aspirin EC 81 MG EC tablet Take 1 tablet (81 mg total) by mouth daily., Starting Tue 01/25/2018, Normal      fluticasone (FLONASE) 50 MCG/ACT nasal spray 2 sprays by Nasal route daily., Starting 01/02/2014, Until Discontinued, Normal      Multiple Vitamin (MULTIVITAMIN) capsule Take 1 capsule by mouth daily, Historical Med      nystatin (NYSTOP) powder Apply to affected area 3 times daily, Normal      ondansetron (ZOFRAN-ODT) 4 MG disintegrating tablet Take 1 tablet (4 mg total) by mouth every 6 (six) hours as needed for Nausea, Starting Fri 09/02/2018, Normal      pantoprazole (PROTONIX) 40 MG tablet TAKE 1 TABLET(40 MG) BY MOUTH DAILY, Normal      valACYclovir HCL (VALTREX) 500 MG tablet TAKE 1 TABLET(500 MG) BY MOUTH DAILY, Normal              Tyray Proch L, DO  10/23/18 0847

## 2018-10-22 NOTE — EDIE (Signed)
COLLECTIVE?NOTIFICATION?10/22/2018 09:48?Amber [name], Amber [name]?MRN: [id number]    Criteria Met      5 ED Visits in 12 Months    Security and Safety  No recent Security Events currently on file    ED Care Guidelines  There are currently no ED Care Guidelines for this patient. Please check your facility's medical records system.            E.D. Visit Count (12 mo.)  Facility Visits   [facility] 2   [place] [facility]: [facility] [place] 1   [place] [facility] 1   [place] [facility]: [facility] 6   Total 10   Note: Visits indicate total known visits.      Recent Emergency Department Visit Summary  Date Facility [facility] Type Diagnoses or Chief Complaint   Oct 22, 2018 [place] [facility]: [facility] [place] [facility]      Fall, Lower Back Pain      Aug 31, 2018 [place] [facility]: [facility] [place] [facility]      abdominal pain      Unsp intestnl obst, unsp as to partial versus complete obst      Jul 26, 2018 [place] [facility]: [facility] [place] [facility]      Pain with swallowing      Cough      Otalgia      Sore Throat      Flu due to oth ident influenza virus w oth resp manifest      Jul 07, 2018 [facility] [place] [name] [facility]      RS ANGINA      CHEST PAIN ADULT      Chest pain, unspecified      Jun 16, 2018 [place] [facility] [place] [facility]      Chest Pain      Chest pain, unspecified      Dizziness and giddiness      Essential (primary) hypertension      Apr 07, 2018 [facility] [name] [address] [facility]      RS CHEST PAIN      Other chest pain      CHEST PAIN ADULT      Mar 08, 2018 [place] [facility] [place] [facility]      abdominal pain,dizzy, nausea      Abdominal Pain      Dizziness      Epigastric pain      Jan 24, 2018 [place] [facility]: [facility] [place] [facility]      Rapid Heart Rate      Chest Pain       Anesthesia of skin      Chest pain, unspecified      Palpitations      Nov 21, 2017 [place] [facility]: [facility] [place] [facility]      TRIAGE - Pain on Left Knee      Knee Pain      Pain in left knee      Nov 04, 2017  Emergency Room: [facility] [place] [facility]      Hypertention,dizziness      Chest Pain      Dizziness      Other fatigue      Bariatric surgery status      Essential (primary) hypertension  Postgastric surgery syndromes          Recent Inpatient Visit Summary  Date Facility [facility] Type Diagnoses or Chief Complaint   Sep 01, 2018 [facility] [place] [facility]      Unsp intestnl obst, unsp as to partial versus complete obst      Feb 08, 2018 [facility] [place] [name] [facility]      DIZZINESS      1. Dizziness and giddiness      2. Transient cerebral ischemic attack, unspecified      4. Essential (primary) hypertension      5. Obstructive sleep apnea (adult) (pediatric)      6. Generalized anxiety disorder      7. Hyperlipidemia, unspecified      8. Gastro-esophageal reflux disease without esophagitis      9. Bilateral primary osteoarthritis of knee      10. Hypokalemia      Jan 24, 2018 [place] [facility]  [facility]      Anesthesia of skin      Palpitations      Chest pain, unspecified      Headache      Paresthesia of skin      Other chest pain          Care Team  Provider Specialty Phone Fax Service Dates   [place], [place]  Family Medicine   Current    Myrtie Neither , M.D. Internal Medicine   Current      Collective Portal  This patient has registered at the [facility] Emergency Room: [facility] Emergency Department   For more information visit: [email or web address]     PLEASE NOTE:     1.   Any care recommendations and other clinical information are provided as guidelines or for historical purposes only, and providers should exercise their  own clinical judgment when providing care.    2.   You may only use this information for purposes of treatment, payment or health care operations activities, and subject to the limitations of applicable Collective Policies.    3.   You should consult directly with the organization that provided a care guideline or other clinical history with any questions about additional information or accuracy or completeness of information provided.    ? 2019 [place], [employer]. - [email or web address]

## 2018-10-22 NOTE — ED Notes (Signed)
Pt condition favorable for discharge per provider. Verbal/written discharge instructions, prescriptions, and f/u care reviewed w/ pt. Pt informed of signs and symptoms that warrant seeking further evaluation/treatment. Pt states has no further questions. Pt aaox4, resp remain even & unlabored, skin warm and dry, ambulatory w/ steady gait out of ED. NAD noted upon departure.

## 2018-10-22 NOTE — ED Triage Notes (Signed)
Pt c/o lower back pain s/p fall last week. Pt states she lost her balance and fell from standing position to ground on her bottom. Pt denies other injuries with fall. Pt reports taking tylenol, using ice and heat with no relief. Last tylenol taken was two days ago. Area tender with palpation and movement. Pt ambulatory with steady gait, MAE strong and equally, aaox4, skin warm and dry, resp even and nonlabored.

## 2018-10-22 NOTE — Discharge Instructions (Signed)
Lumbar Compression Fracture     You have a compression fracture of a vertebra (bone) in your lumbar spine (lower back).     A compression fracture results when a fall or injury compresses the vertebrae. You can think of this as the cube-shaped vertebrae being partly "squashed" flat.     A fracture is a break in a bone. It means the same thing as saying a "broken bone."     Treatment of your fracture may include the following:  · The use of a custom-made back brace.  · Pain medication.  · Rest and limited activity.     YOU SHOULD SEEK MEDICAL ATTENTION IMMEDIATELY, EITHER HERE OR AT THE NEAREST EMERGENCY DEPARTMENT, IF ANY OF THE FOLLOWING OCCURS:  · Your pain gets much worse.  · One or both of your legs begins to tingle or feel numb.  · You cannot control your urine or bowel movements (you soil or wet yourself).  · You vomit or have severe constipation or abdominal pain.

## 2018-10-24 ENCOUNTER — Telehealth (INDEPENDENT_AMBULATORY_CARE_PROVIDER_SITE_OTHER): Payer: Self-pay | Admitting: Internal Medicine

## 2018-10-24 NOTE — Telephone Encounter (Signed)
Please call pt to schedule

## 2018-10-24 NOTE — Telephone Encounter (Signed)
Patient needs to be seen for severe back pain and is requesting to be squeezed in for a sooner appointment than available. Please return call at noted phone number below.    Patient declined to schedule next available appointment    Patient Preferred Callback Number:   [phone]

## 2018-10-27 ENCOUNTER — Encounter (INDEPENDENT_AMBULATORY_CARE_PROVIDER_SITE_OTHER): Payer: Self-pay | Admitting: Internal Medicine

## 2018-10-27 ENCOUNTER — Ambulatory Visit (INDEPENDENT_AMBULATORY_CARE_PROVIDER_SITE_OTHER): Payer: Medicare Other | Admitting: Internal Medicine

## 2018-10-27 VITALS — BP 159/96 | HR 75 | Temp 97.9°F | Ht 61.0 in | Wt 132.4 lb

## 2018-10-27 DIAGNOSIS — S32010D Wedge compression fracture of first lumbar vertebra, subsequent encounter for fracture with routine healing: Secondary | ICD-10-CM

## 2018-10-27 DIAGNOSIS — I10 Essential (primary) hypertension: Secondary | ICD-10-CM

## 2018-10-27 MED ORDER — TRAMADOL HCL 50 MG PO TABS
50.0000 mg | ORAL_TABLET | Freq: Two times a day (BID) | ORAL | 1 refills | Status: DC | PRN
Start: 2018-10-27 — End: 2018-12-23

## 2018-10-27 NOTE — Progress Notes (Signed)
Subjective:      Patient ID: Amber [name] is a 67 y.o. female     Chief Complaint   Patient presents with   . ER Follow-up     [facility] at [facility].        HPI   Patient presents for ER follow up for a fall.    Pt was seen at [facility] on [facility] due to a fall. She reports that she lost her balance and fell backwards and is experiencing severe back pain. ER performed XR of back that showed a compression fracture of L1. Rx tramadol for pain that helps, but 1/day is not enough for the pain. Pain is rated a 10/10 but while on tramadol it improves pain and makes her fall asleep. She has also tried Tylenol, but it does not relieve pain. Pain started in her mid back and is now across the lower back and radiates to the front of her pelvic bones.     HTN- BP today is 159/96. Compliant with amlodipine. Monitors BP at home, reporting readings typically within normal range. No CP, no SOB.    Bowel obstruction in 11/19- s/p surgery. uses Miralax to treat constipation. Appetite is not good, but reports that she tries her best to eat normally.     VitD deficiency- Compliant with supplement.   Lab Results   Component Value Date    VITD 26 (L) 08/16/2018    VITD 33 04/30/2017    VITD 28 04/30/2017    VITD 5 04/30/2017       The following sections were reviewed this encounter by the provider:   Allergies         Review of Systems   Constitutional: Negative for activity change, appetite change, fever and unexpected weight change.   Eyes: Negative for visual disturbance.   Respiratory: Negative for shortness of breath.    Cardiovascular: Negative for chest pain and palpitations.   Gastrointestinal: Positive for constipation. Negative for abdominal pain, diarrhea, nausea and vomiting.   Genitourinary: Negative for difficulty urinating.   Musculoskeletal: Positive for back pain. Negative for myalgias.   Neurological: Negative for syncope, weakness, light-headedness and headaches.   Psychiatric/Behavioral:  Negative for sleep disturbance.   All other systems reviewed and are negative.         BP (!) 159/96   Pulse 75   Temp 97.9 F (36.6 C) (Oral)   Ht 1.549 m (5\' 1" )   Wt 60.1 kg (132 lb 6.4 oz)   BMI 25.02 kg/m     Objective:     Physical Exam  Vitals signs reviewed.   Constitutional:       General: She is not in acute distress.     Appearance: Normal appearance. She is well-developed and normal weight.   HENT:      Head: Normocephalic and atraumatic.      Right Ear: Tympanic membrane normal.      Left Ear: Tympanic membrane normal.      Nose: Nose normal.      Mouth/Throat:      Mouth: Mucous membranes are moist.      Pharynx: Oropharynx is clear. No oropharyngeal exudate.   Eyes:      General: No scleral icterus.     Conjunctiva/sclera: Conjunctivae normal.   Neck:      Musculoskeletal: Neck supple.      Thyroid: No thyromegaly.      Vascular: No carotid bruit or JVD.  Cardiovascular:      Rate and Rhythm: Normal rate and regular rhythm.      Pulses: Normal pulses.      Heart sounds: Normal heart sounds. No murmur. No friction rub. No gallop.    Pulmonary:      Effort: Pulmonary effort is normal. No respiratory distress.      Breath sounds: Normal breath sounds. No wheezing, rhonchi or rales.   Abdominal:      General: Bowel sounds are normal. There is no distension.      Palpations: Abdomen is soft. There is no mass.      Tenderness: There is no abdominal tenderness. There is no guarding or rebound.   Musculoskeletal:         General: Tenderness present.      Lumbar back: She exhibits tenderness.      Comments: +lower lumbar tenderness, +reduced ROM, neg SLT   Lymphadenopathy:      Cervical: No cervical adenopathy.   Skin:     General: Skin is warm and dry.   Neurological:      General: No focal deficit present.      Mental Status: She is alert.   Psychiatric:         Mood and Affect: Mood normal.         Behavior: Behavior normal.          Assessment:     1. Compression fracture of L1 vertebra with  routine healing, subsequent encounter  - Dxa Bone Density Axial Skeleton; Future  - traMADol (ULTRAM) 50 MG tablet; Take 1 tablet (50 mg total) by mouth 2 (two) times daily as needed for Pain (breakthrough pain)  Dispense: 60 tablet; Refill: 1    2. Essential hypertension        Plan:   Continue current medications.  PMP reviewed.   Rx refill tramadol 50mg  #60, take twice daily.   DXA scan ordered.  Obtain back brace at local pharmacy, to wear prn.   Monitor BP.  Low carb, low cholesterol diet, exercise, weight control.  Keep adequately hydrated.  F/u in 4 weeks.     By signing my name below, I, Franky Macho, Scribe, attest that this documentation has been prepared under the direction and in the presence of Myrtie Neither, MD.    I have reviewed the chart and agree that the record accurately reflects my personal performance of the history, physical exam, discussion and plan.    Excell Seltzer, MD  [facility], [facility]  [address]  [place], [place] [address]  [phone]

## 2018-10-27 NOTE — Progress Notes (Signed)
Have you seen any specialists/other providers since your last visit with [place]?    No    Arm preference verified?   Yes    The patient is due for mammogram, influenza vaccine, pneumonia vaccine and DXA scan

## 2018-11-09 ENCOUNTER — Encounter (INDEPENDENT_AMBULATORY_CARE_PROVIDER_SITE_OTHER): Payer: Self-pay

## 2018-11-10 ENCOUNTER — Encounter (INDEPENDENT_AMBULATORY_CARE_PROVIDER_SITE_OTHER): Payer: Self-pay

## 2018-11-14 ENCOUNTER — Ambulatory Visit (INDEPENDENT_AMBULATORY_CARE_PROVIDER_SITE_OTHER): Payer: Medicare Other

## 2018-11-22 ENCOUNTER — Other Ambulatory Visit (INDEPENDENT_AMBULATORY_CARE_PROVIDER_SITE_OTHER): Payer: Self-pay | Admitting: Internal Medicine

## 2018-11-28 ENCOUNTER — Telehealth (INDEPENDENT_AMBULATORY_CARE_PROVIDER_SITE_OTHER): Payer: Self-pay | Admitting: Internal Medicine

## 2018-11-28 ENCOUNTER — Ambulatory Visit (INDEPENDENT_AMBULATORY_CARE_PROVIDER_SITE_OTHER): Payer: Medicare Other | Admitting: Internal Medicine

## 2018-11-28 ENCOUNTER — Encounter (INDEPENDENT_AMBULATORY_CARE_PROVIDER_SITE_OTHER): Payer: Self-pay | Admitting: Internal Medicine

## 2018-11-28 VITALS — BP 148/84 | HR 75 | Temp 98.1°F | Ht 61.0 in | Wt 136.4 lb

## 2018-11-28 DIAGNOSIS — M545 Low back pain: Secondary | ICD-10-CM

## 2018-11-28 DIAGNOSIS — G8929 Other chronic pain: Secondary | ICD-10-CM

## 2018-11-28 DIAGNOSIS — Z1239 Encounter for other screening for malignant neoplasm of breast: Secondary | ICD-10-CM

## 2018-11-28 DIAGNOSIS — J323 Chronic sphenoidal sinusitis: Secondary | ICD-10-CM | POA: Insufficient documentation

## 2018-11-28 DIAGNOSIS — J3489 Other specified disorders of nose and nasal sinuses: Secondary | ICD-10-CM | POA: Insufficient documentation

## 2018-11-28 DIAGNOSIS — M170 Bilateral primary osteoarthritis of knee: Secondary | ICD-10-CM | POA: Insufficient documentation

## 2018-11-28 DIAGNOSIS — I10 Essential (primary) hypertension: Secondary | ICD-10-CM

## 2018-11-28 DIAGNOSIS — J321 Chronic frontal sinusitis: Secondary | ICD-10-CM | POA: Insufficient documentation

## 2018-11-28 DIAGNOSIS — J309 Allergic rhinitis, unspecified: Secondary | ICD-10-CM | POA: Insufficient documentation

## 2018-11-28 DIAGNOSIS — S32010D Wedge compression fracture of first lumbar vertebra, subsequent encounter for fracture with routine healing: Secondary | ICD-10-CM

## 2018-11-28 DIAGNOSIS — Z23 Encounter for immunization: Secondary | ICD-10-CM

## 2018-11-28 DIAGNOSIS — J329 Chronic sinusitis, unspecified: Secondary | ICD-10-CM | POA: Insufficient documentation

## 2018-11-28 DIAGNOSIS — Z1231 Encounter for screening mammogram for malignant neoplasm of breast: Secondary | ICD-10-CM

## 2018-11-28 MED ORDER — AMLODIPINE BESYLATE 5 MG PO TABS
5.0000 mg | ORAL_TABLET | Freq: Every day | ORAL | 1 refills | Status: DC
Start: 2018-11-28 — End: 2019-01-05

## 2018-11-28 NOTE — Telephone Encounter (Signed)
Name, strength, directions of requested refill(s):    pantoprazole (PROTONIX) 40 MG tablet    Patient is requesting an insurance approval for the above medication.  Please call and advise.    Insurance ph no:  [phone] press #1    Pharmacy to send refill to or patient to pick up rx from office (mark requested pharmacy in BOLD):    [id number] [place], [place] - [address]  [address]  [facility] [place] [address]  Phone: [phone] Fax: [phone]    Please mark "X" next to the preferred call back number:    Mobile:   Telephone Information:   Mobile [phone]    x   Home: @HOMEPHONE @    Work: @WORKPHONE @          Next visit: 01/27/2019

## 2018-11-28 NOTE — Progress Notes (Signed)
Subjective:      Patient ID: Amber [name] is a 68 y.o. female     Chief Complaint   Patient presents with   . Follow-up     Compression fracture of L1 vertebra.        HPI     Pt presents for follow-up.    Compression fracture of L1 vertebra- not taking tramadol due to SE's. Taking otc tylenol prn. Pain when sitting of an extended time. Nml leg strength. Pt not wearing brace due to pain.    OA of bilateral knees-  Received cortisone injections. Previously followed with Dr. Prudencio Pair in orthopedics, who recommended a total knee replacement, but pt has not pursued.     HTN- Blood pressure today is 148/84. Patient is compliant with amlodipine 5 mg. Monitors BP at home, reporting readings of 140s-150s/80-100. No chest pain or shortness of breath.    GERD- Compliant with Protonix 40 mg.    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    The following sections were reviewed this encounter by the provider:        Review of Systems   Constitutional: Negative for activity change, appetite change, fever and unexpected weight change.   Eyes: Negative for visual disturbance.   Respiratory: Negative for shortness of breath.    Cardiovascular: Negative for chest pain and palpitations.   Gastrointestinal: Negative for abdominal pain, constipation, diarrhea, nausea and vomiting.   Genitourinary: Negative for difficulty urinating.   Musculoskeletal: Positive for arthralgias and back pain. Negative for myalgias.   Neurological: Negative for syncope, weakness, numbness and headaches.   Psychiatric/Behavioral: Negative for sleep disturbance.          BP 148/84   Pulse 75   Temp 98.1 F (36.7 C) (Oral)   Ht 1.549 m (5\' 1" )   Wt 61.9 kg (136 lb 6.4 oz)   BMI 25.77 kg/m     Objective:     Physical Exam  Vitals signs reviewed.   Constitutional:       Appearance: Normal appearance. She is overweight.   HENT:      Head: Normocephalic and atraumatic.      Nose: Nose normal.      Mouth/Throat:      Mouth: Mucous membranes are moist.       Pharynx: Oropharynx is clear.   Eyes:      Conjunctiva/sclera: Conjunctivae normal.   Neck:      Musculoskeletal: Neck supple.      Vascular: No carotid bruit.   Cardiovascular:      Rate and Rhythm: Normal rate and regular rhythm.      Pulses: Normal pulses.      Heart sounds: Normal heart sounds. No murmur. No friction rub. No gallop.    Pulmonary:      Effort: Pulmonary effort is normal. No respiratory distress.      Breath sounds: Normal breath sounds. No wheezing, rhonchi or rales.   Abdominal:      General: Bowel sounds are normal. There is no distension.      Palpations: Abdomen is soft.      Tenderness: There is no abdominal tenderness. There is no guarding or rebound.   Musculoskeletal:         General: Tenderness present.      Comments: +tenderness lumbar spine, +min reduced ROM.   +crepitus bilat knee, +tenderness left knee, no swelling   Lymphadenopathy:      Cervical: No cervical adenopathy.   Skin:  General: Skin is warm and dry.   Neurological:      General: No focal deficit present.      Mental Status: She is alert.   Psychiatric:         Mood and Affect: Mood normal.         Behavior: Behavior normal.          Assessment:     1. Essential hypertension  - amLODIPine (NORVASC) 5 MG tablet; Take 1 tablet (5 mg total) by mouth daily  Dispense: 90 tablet; Refill: 1    2. Breast cancer screening  - Mammo Digital Screening Bilateral W Cad; Future    3. Flu vaccine need  - Flu vacc quad recombinant pres free 18 yrs& up    4. Pneumococcal vaccination administered at current visit  - Pneumococcal conjugate vaccine 13-valent    5. Compression fracture of L1 vertebra with routine healing, subsequent encounter    6. Chronic midline low back pain without sciatica    7. Encounter for screening mammogram for malignant neoplasm of breast   - Mammo Digital Screening Bilateral W Cad; Future        Plan:     Continue current medications. Refills given.    Increase amlodipine to 5 mg, 1 tablet per day.  Influenza and  Pneumococcal vaccines administered in office today. Patient tolerated well.  Low carb, low cholesterol diet, exercise, weight reduction.  Mammogram ordered.  Monitor BP  F/u ortho  F/u in 2 months.      By signing my name below, I, Gerrit Friends, Scribe, attest that this documentation has been prepared under the direction and in the presence of Myrtie Neither, MD.    I have reviewed the chart and agree that the record accurately reflects my personal performance of the history, physical exam, discussion and plan.    Excell Seltzer, MD  [facility], [facility]  [address]  [place], [place] [address]  [phone]

## 2018-11-28 NOTE — Progress Notes (Signed)
Have you seen any specialists/other providers since your last visit with [place]?    No    Arm preference verified?   Yes    The patient is due for mammogram, pneumonia vaccine and Hep C screening.

## 2018-11-29 NOTE — Telephone Encounter (Signed)
PA form was fax from pharm is pending on the nurse desk.(Dr.Bui Nurse)

## 2018-12-01 ENCOUNTER — Encounter (INDEPENDENT_AMBULATORY_CARE_PROVIDER_SITE_OTHER): Payer: Self-pay | Admitting: Internal Medicine

## 2018-12-01 NOTE — Telephone Encounter (Signed)
Spoke with dr Daivd Council nurse will submit PA [place] rmy meds. Please transfer call to the office next time

## 2018-12-01 NOTE — Telephone Encounter (Signed)
Patient is following up on a prior authorization for her medication.  See previous encounter dated 11/28/18.  She stated that she was told her doctor could call the number below and get a verbal authorization.  Please call and advise.    Insurance ph no:  [phone] press #1

## 2018-12-02 ENCOUNTER — Encounter (INDEPENDENT_AMBULATORY_CARE_PROVIDER_SITE_OTHER): Payer: Self-pay | Admitting: Internal Medicine

## 2018-12-09 ENCOUNTER — Encounter (INDEPENDENT_AMBULATORY_CARE_PROVIDER_SITE_OTHER): Payer: Self-pay | Admitting: Internal Medicine

## 2018-12-14 ENCOUNTER — Encounter (INDEPENDENT_AMBULATORY_CARE_PROVIDER_SITE_OTHER): Payer: Self-pay

## 2018-12-22 ENCOUNTER — Telehealth (INDEPENDENT_AMBULATORY_CARE_PROVIDER_SITE_OTHER): Payer: Self-pay

## 2018-12-22 NOTE — Telephone Encounter (Signed)
Made appt 2/21

## 2018-12-22 NOTE — Telephone Encounter (Signed)
Pt started Norvasc last month and has had side effects since starting and they have not improved. Pt states she notices an increased HR all the time and worse with minor exertion, nausea, and generally doesn't feel good since starting the medication. BP today 124/88. Pt would like a call today to discuss possible other options and does not like experiencing the side effects. Pt not in any distress at time of call, advised if increase with ssx, or new onset to go to ED pt agreed. Addison Lank RN

## 2018-12-23 ENCOUNTER — Ambulatory Visit (INDEPENDENT_AMBULATORY_CARE_PROVIDER_SITE_OTHER): Payer: Medicare Other | Admitting: Internal Medicine

## 2018-12-23 ENCOUNTER — Encounter (INDEPENDENT_AMBULATORY_CARE_PROVIDER_SITE_OTHER): Payer: Self-pay | Admitting: Internal Medicine

## 2018-12-23 VITALS — BP 153/82 | HR 71 | Temp 98.2°F | Ht 61.0 in | Wt 135.4 lb

## 2018-12-23 DIAGNOSIS — I10 Essential (primary) hypertension: Secondary | ICD-10-CM

## 2018-12-23 DIAGNOSIS — R110 Nausea: Secondary | ICD-10-CM

## 2018-12-23 MED ORDER — ATENOLOL 25 MG PO TABS
25.0000 mg | ORAL_TABLET | Freq: Every day | ORAL | 1 refills | Status: DC
Start: 2018-12-23 — End: 2019-03-29

## 2018-12-23 MED ORDER — ONDANSETRON 4 MG PO TBDP
4.00 mg | ORAL_TABLET | Freq: Four times a day (QID) | ORAL | 3 refills | Status: DC | PRN
Start: 2018-12-23 — End: 2019-01-02

## 2018-12-23 NOTE — Progress Notes (Signed)
Subjective:      Patient ID: Amber [name] is a 68 y.o. female     Chief Complaint   Patient presents with   . Abdominal Pain   . Nausea   . Chest Pain        HPI   Patient presents for acute care c/o abdominal pain, nausea, and chest pain x3 weeks    +abd pain, +CP, +nausea, +palpitations  Reports that these symptoms have started with introduction of amlodipine. She continues compliance with medication, but notes that her symptoms start after taking medication and last all day. Sx nonexertional,  She monitors her BP at home, reporting readings of 150-160s/80-90s. She has various reactions to anti-HTN medications, but previously was on atenolol and tolerated med. No regular exercise or walking.   Had neg nuclear stress test 1 yr ago.   Has cardiologist.     The following sections were reviewed this encounter by the provider:   Allergies         Review of Systems   Constitutional: Negative for activity change, appetite change, fever and unexpected weight change.   Eyes: Negative for visual disturbance.   Respiratory: Negative for shortness of breath.    Cardiovascular: Positive for chest pain and palpitations.   Gastrointestinal: Positive for abdominal pain and nausea. Negative for constipation, diarrhea and vomiting.   Genitourinary: Negative for difficulty urinating.   Musculoskeletal: Negative for myalgias.   Neurological: Negative for syncope, weakness, light-headedness and headaches.   Psychiatric/Behavioral: Negative for sleep disturbance.   All other systems reviewed and are negative.         BP 153/82   Pulse 71   Temp 98.2 F (36.8 C) (Oral)   Ht 1.549 m (5\' 1" )   Wt 61.4 kg (135 lb 6.4 oz)   BMI 25.58 kg/m     Objective:     Physical Exam  Vitals signs reviewed.   Constitutional:       General: She is not in acute distress.     Appearance: Normal appearance. She is well-developed and overweight.   HENT:      Head: Normocephalic and atraumatic.      Right Ear: Tympanic membrane normal.      Left  Ear: Tympanic membrane normal.      Nose: Nose normal.      Mouth/Throat:      Mouth: Mucous membranes are moist.      Pharynx: Oropharynx is clear. No oropharyngeal exudate.   Eyes:      General: No scleral icterus.     Conjunctiva/sclera: Conjunctivae normal.   Neck:      Musculoskeletal: Neck supple.      Thyroid: No thyromegaly.      Vascular: No carotid bruit or JVD.   Cardiovascular:      Rate and Rhythm: Normal rate and regular rhythm.      Pulses: Normal pulses.      Heart sounds: Normal heart sounds. No murmur. No friction rub. No gallop.    Pulmonary:      Effort: Pulmonary effort is normal. No respiratory distress.      Breath sounds: Normal breath sounds. No wheezing, rhonchi or rales.   Abdominal:      General: Bowel sounds are normal. There is no distension.      Palpations: Abdomen is soft. There is no mass.      Tenderness: There is no abdominal tenderness. There is no guarding or rebound.   Lymphadenopathy:  Cervical: No cervical adenopathy.   Skin:     General: Skin is warm and dry.   Neurological:      General: No focal deficit present.      Mental Status: She is alert.   Psychiatric:         Mood and Affect: Mood normal.         Behavior: Behavior normal.          Assessment:     1. Essential hypertension  - atenolol (TENORMIN) 25 MG tablet; Take 1 tablet (25 mg total) by mouth daily  Dispense: 90 tablet; Refill: 1    2. Nausea  - ondansetron (ZOFRAN-ODT) 4 MG disintegrating tablet; Take 1 tablet (4 mg total) by mouth every 6 (six) hours as needed for Nausea  Dispense: 30 tablet; Refill: 3        Plan:   Stop amlodipine.   Rx add atenolol 25mg  daily.  Rx refill ondansetron 4mg   Monitor BP.  F/u cardiologist if CP persistent.   Follow with specialists as directed.  F/u in 4 weeks.     By signing my name below, I, Franky Macho, Scribe, attest that this documentation has been prepared under the direction and in the presence of Myrtie Neither, MD.    I have reviewed the chart and agree that the  record accurately reflects my personal performance of the history, physical exam, discussion and plan.    Excell Seltzer, MD  [facility], [facility]  [address]  [place], [place] [address]  [phone]

## 2018-12-23 NOTE — Progress Notes (Signed)
Have you seen any specialists/other providers since your last visit with us?    No    Arm preference verified?   Yes    The patient is due for pap smear and hep c screening

## 2019-01-02 ENCOUNTER — Other Ambulatory Visit (INDEPENDENT_AMBULATORY_CARE_PROVIDER_SITE_OTHER): Payer: Self-pay | Admitting: Internal Medicine

## 2019-01-02 ENCOUNTER — Emergency Department
Admission: EM | Admit: 2019-01-02 | Discharge: 2019-01-02 | Disposition: A | Discharge status: Home / Self Care | Payer: Medicare Other | Attending: Emergency Medicine | Admitting: Emergency Medicine

## 2019-01-02 DIAGNOSIS — Z1239 Encounter for other screening for malignant neoplasm of breast: Secondary | ICD-10-CM

## 2019-01-02 DIAGNOSIS — Z8709 Personal history of other diseases of the respiratory system: Secondary | ICD-10-CM | POA: Insufficient documentation

## 2019-01-02 DIAGNOSIS — Z1231 Encounter for screening mammogram for malignant neoplasm of breast: Secondary | ICD-10-CM

## 2019-01-02 DIAGNOSIS — Z8673 Personal history of transient ischemic attack (TIA), and cerebral infarction without residual deficits: Secondary | ICD-10-CM | POA: Insufficient documentation

## 2019-01-02 DIAGNOSIS — J40 Bronchitis, not specified as acute or chronic: Secondary | ICD-10-CM | POA: Insufficient documentation

## 2019-01-02 DIAGNOSIS — E785 Hyperlipidemia, unspecified: Secondary | ICD-10-CM | POA: Insufficient documentation

## 2019-01-02 DIAGNOSIS — R1013 Epigastric pain: Secondary | ICD-10-CM

## 2019-01-02 DIAGNOSIS — Z9884 Bariatric surgery status: Secondary | ICD-10-CM | POA: Insufficient documentation

## 2019-01-02 DIAGNOSIS — Z7982 Long term (current) use of aspirin: Secondary | ICD-10-CM | POA: Insufficient documentation

## 2019-01-02 DIAGNOSIS — Z9049 Acquired absence of other specified parts of digestive tract: Secondary | ICD-10-CM | POA: Insufficient documentation

## 2019-01-02 DIAGNOSIS — K219 Gastro-esophageal reflux disease without esophagitis: Secondary | ICD-10-CM | POA: Insufficient documentation

## 2019-01-02 DIAGNOSIS — I10 Essential (primary) hypertension: Secondary | ICD-10-CM | POA: Insufficient documentation

## 2019-01-02 HISTORY — DX: Bronchitis, not specified as acute or chronic: J40

## 2019-01-02 LAB — URINALYSIS REFLEX TO MICROSCOPIC EXAM - REFLEX TO CULTURE
Bilirubin, UA: NEGATIVE
Glucose, UA: NEGATIVE
Ketones UA: NEGATIVE
Nitrite, UA: NEGATIVE
Protein, UR: NEGATIVE
Specific Gravity UA: 1.01 (ref 1.001–1.035)
Urine pH: 6.5 (ref 5.0–8.0)
Urobilinogen, UA: 0.2 mg/dL (ref 0.2–2.0)

## 2019-01-02 MED ORDER — ALUM & MAG HYDROXIDE-SIMETH 200-200-20 MG/5ML PO SUSP
20.00 mL | Freq: Once | ORAL | Status: AC
Start: 2019-01-02 — End: 2019-01-02
  Administered 2019-01-02: 11:00:00 20 mL via ORAL
  Filled 2019-01-02: qty 30

## 2019-01-02 MED ORDER — BENZONATATE 100 MG PO CAPS
100.00 mg | ORAL_CAPSULE | Freq: Three times a day (TID) | ORAL | 0 refills | Status: DC | PRN
Start: 2019-01-02 — End: 2019-01-20

## 2019-01-02 MED ORDER — ALBUTEROL SULFATE HFA 108 (90 BASE) MCG/ACT IN AERS
2.0000 | INHALATION_SPRAY | RESPIRATORY_TRACT | 0 refills | Status: AC | PRN
Start: 2019-01-02 — End: 2019-02-01

## 2019-01-02 MED ORDER — LIDOCAINE VISCOUS HCL 2 % MT SOLN
10.00 mL | Freq: Once | OROMUCOSAL | Status: AC
Start: 2019-01-02 — End: 2019-01-02
  Administered 2019-01-02: 11:00:00 10 mL via OROMUCOSAL
  Filled 2019-01-02: qty 15

## 2019-01-02 MED ORDER — METOCLOPRAMIDE HCL 10 MG PO TABS
10.0000 mg | ORAL_TABLET | Freq: Once | ORAL | Status: DC
Start: 2019-01-02 — End: 2019-01-02

## 2019-01-02 MED ORDER — ALBUTEROL SULFATE (2.5 MG/3ML) 0.083% IN NEBU
2.50 mg | INHALATION_SOLUTION | Freq: Once | RESPIRATORY_TRACT | Status: AC
Start: 2019-01-02 — End: 2019-01-02
  Administered 2019-01-02: 11:00:00 2.5 mg via RESPIRATORY_TRACT
  Filled 2019-01-02: qty 3

## 2019-01-02 MED ORDER — IPRATROPIUM BROMIDE 0.02 % IN SOLN
0.50 mg | Freq: Once | RESPIRATORY_TRACT | Status: AC
Start: 2019-01-02 — End: 2019-01-02
  Administered 2019-01-02: 11:00:00 0.5 mg via RESPIRATORY_TRACT
  Filled 2019-01-02: qty 2.5

## 2019-01-02 MED ORDER — FAMOTIDINE 20 MG PO TABS
20.0000 mg | ORAL_TABLET | Freq: Once | ORAL | Status: AC
Start: 2019-01-02 — End: 2019-01-02
  Administered 2019-01-02: 11:00:00 20 mg via ORAL
  Filled 2019-01-02: qty 1

## 2019-01-02 MED ORDER — METOCLOPRAMIDE HCL 5 MG PO TABS
5.0000 mg | ORAL_TABLET | Freq: Four times a day (QID) | ORAL | 0 refills | Status: DC | PRN
Start: 2019-01-02 — End: 2019-05-22

## 2019-01-02 MED ORDER — CHLORPROMAZINE HCL 25 MG PO TABS
25.0000 mg | ORAL_TABLET | Freq: Once | ORAL | Status: AC
Start: 2019-01-02 — End: 2019-01-02
  Administered 2019-01-02: 11:00:00 25 mg via ORAL
  Filled 2019-01-02: qty 1

## 2019-01-02 NOTE — ED Notes (Signed)
Rang call bell many times but no one came for some time as call bell system seems to be broken.

## 2019-01-02 NOTE — Progress Notes (Signed)
Mammogram is normal    Rec:  1. Continue routine annual screening

## 2019-01-02 NOTE — ED Provider Notes (Addendum)
Physician/Midlevel provider first contact with patient: 01/02/19 0959         History     Chief Complaint   Patient presents with   . Flu like symptoms     HPI   Amber [name] is a 68 y.o. female with a history of asthma, GERD.    1. Location/type of symptoms: cough  2. Quality: pinkish sputum  3. Onset: 4 days ago  4. Severity of symptoms: 8/10  5. Timing: intermittent   6. Radiation of symptoms: no   7. Associated signs and symptoms: nausea x 2 days. Dizziness, fatigue, headache, epigastric pain. No fevers.    8. Symptoms are improved by: tylenol and protonix    9. Chart review shows patient received an influenza vaccine on 28 November 2018             Past Medical History:   Diagnosis Date   . Asthma    . Chest pain 2016     Chest pain after eating x6  months--being followed by GI for GERD   . Constipation    . Difficulty walking     amb with cane secondary to arthritis bil knees   . Genital herpes     no current outbreak (10/03/15)   . GERD (gastroesophageal reflux disease)    . Hiatal hernia     h/o surgery   . Hyperlipidemia    . Hypertensive disorder     Well controlled on med. Denies any SOB in last 6 months (10/03/15)   . Low back pain    . Morbid obesity with BMI of 40.0-44.9, adult     BMI 40.9   . Nausea without vomiting    . OSA on CPAP 2015    CPAP nightly x 1 yr   . Osteoarthritis of knees, bilateral     and back   . TIA (transient ischemic attack) 2014    TIA 2014-no residual. Patient evaulated for possible TIA 07/12/2015  @ [facility] (dischage summary in epic)-per summary CT of head was normal and patient was d/c'd       Past Surgical History:   Procedure Laterality Date   . APPENDECTOMY  >25 yrs   . CHOLECYSTECTOMY     . EGD  01/2015   . EGD, BIOPSY N/A 09/15/2017    Procedure: EGD, BIOPSY;  Surgeon: Pershing Proud, MD;  Location: [facility];  Service: Gastroenterology;  Laterality: N/A;   . fallopian tube surgery  >25 yrs   . INSERTION, PAIN PUMP (MEDICAL) N/A 10/22/2015    Procedure:  INSERTION, PAIN PUMP (MEDICAL);  Surgeon: Nicola Police, DO;  Location: [place];  Service: General;  Laterality: N/A;   . LAPAROSCOPIC, CHOLECYSTECTOMY, CHOLANGIOGRAM  08/13/2014   . LAPAROSCOPIC, GASTRIC BYPASS N/A 10/22/2015    Procedure: LAPAROSCOPIC, GASTRIC BYPASS;  Surgeon: Jeanell Sparrow, Hamid R, DO;  Location: [place] [facility];  Service: General;  Laterality: N/A;   . LAPAROSCOPIC, HERNIORRHAPHY, HIATAL N/A 10/22/2015    Procedure: LAPAROSCOPIC, HERNIORRHAPHY, HIATAL;  Surgeon: Nicola Police, DO;  Location: [place] [facility];  Service: General;  Laterality: N/A;   . LAPAROSCOPIC, LYSIS, ADHESIONS N/A 09/01/2018    Procedure: LAPAROSCOPIC, LYSIS, ADHESIONS RELEASE OF  SMALL BOWEL OBSTRUCTION;  Surgeon: Champ Mungo, MD;  Location: [facility];  Service: General;  Laterality: N/A;   . LAPAROSCOPY, DIAGNOSTIC N/A 09/01/2018    Procedure: LAPAROSCOPY, DIAGNOSTIC;  Surgeon: Champ Mungo, MD;  Location:  MAIN OR;  Service: General;  Laterality: N/A;   . OVARY SURGERY Right >25 yrs   . SPINE SURGERY  11/2013    cervical HNP x 2, Dr. Bufford Buttner       Family History   Problem Relation Age of Onset   . Cancer Mother 40        breast cancer   . Breast cancer Mother    . Heart disease Father    . Malignant hyperthermia Neg Hx    . Pseudochol deficiency Neg Hx        Social  Social History     Tobacco Use   . Smoking status: Never Smoker   . Smokeless tobacco: Never Used   Substance Use Topics   . Alcohol use: No   . Drug use: No       .     Allergies   Allergen Reactions   . Acyclovir Nausea And Vomiting     Other reaction(s): gi distress   . Amlodipine      nausea   . Amoxicillin-Pot Clavulanate      Other reaction(s): gi distress     . Aspirin Nausea And Vomiting     Patient states "it upsets my stomach"  Other reaction(s): gi distress  Upsets stomach  Able to tolerate enteric coated aspirin   . Atorvastatin        Palpitation   . Carvedilol      Extreme fatigue   . Lisinopril      nausea    . Lovastatin Nausea And Vomiting     Other reaction(s): gi distress   . Metronidazole Nausea And Vomiting     Other reaction(s): gi distress   . Moxifloxacin Nausea And Vomiting         GI symptoms   . Other Nausea And Vomiting   . Rosuvastatin      Palpitation, chest pain, abd pain    . Statins    . Sulfa Antibiotics Nausea And Vomiting     Other reaction(s): gi distress       Home Medications     Med List Status:  Complete Set By: Emilio Math, RN at 01/02/2019 10:13 AM                amLODIPine (NORVASC) 5 MG tablet     Take 1 tablet (5 mg total) by mouth daily     aspirin EC 81 MG EC tablet     Take 1 tablet (81 mg total) by mouth daily.     atenolol (TENORMIN) 25 MG tablet     Take 1 tablet (25 mg total) by mouth daily     fluticasone (FLONASE) 50 MCG/ACT nasal spray     2 sprays by Nasal route daily.     Patient taking differently:  2 sprays by Nasal route as needed.        Multiple Vitamin (MULTIVITAMIN) capsule     Take 1 capsule by mouth daily     nystatin (NYSTOP) powder     Apply to affected area 3 times daily     pantoprazole (PROTONIX) 40 MG tablet     TAKE 1 TABLET(40 MG) BY MOUTH DAILY     valACYclovir HCL (VALTREX) 500 MG tablet     TAKE 1 TABLET(500 MG) BY MOUTH DAILY     Patient taking differently:  daily  Review of Systems   Constitutional: Positive for fatigue. Negative for fever.   Respiratory: Positive for cough. Negative for shortness of breath.    Gastrointestinal: Positive for abdominal pain (epigastric) and nausea.   Neurological: Positive for dizziness and headaches.   All other systems reviewed and are negative.      Physical Exam    BP: 153/72, Heart Rate: 82, Temp: 98.2 F (36.8 C), Resp Rate: 12, SpO2: 98 %, Weight: 62.3 kg    Physical Exam    Nursing notes and vital signs reviewed     General - no acute distress  Head - normocephalic, atraumatic  Eyes - no scleral icterus, EOMI  Ears/Nose/Throat - moist mucous membranes; airway patent, oropharynx  clear  Cardiovascular - normal rate, regular rhythm, 2+ distal pulses, no LE swelling, no calf tenderness  Pulmonary - normal respiratory effort, wheezing LUL  Abdomen - soft, no TTP, non distended, + bowel sounds  Musculoskeletal - no gross injuries or deformities.   Neurological - alert, face symmetric, no dysarthria, no gross deficits  Psych - behaving appropriately   Skin - warm, dry    MDM and ED Course   DDx includes flu, GERD, bronchitis, ACS, UTI.     Pt feels better on re-evaluation.   Instructed to return for worsening symptoms.  F/u with PMD.  Discussed side-effects of prescriptions prior to discharge.       ED Medication Orders (From admission, onward)    Start Ordered     Status Ordering Provider    01/02/19 1100 01/02/19 1059  chlorproMAZINE (THORAZINE) tablet 25 mg  Once in ED     Route: Oral  Ordered Dose: 25 mg     Last MAR action:  Given Niel Hummer, [facility]    01/02/19 1047 01/02/19 1046  famotidine (PEPCID) tablet 20 mg  Once     Route: Oral  Ordered Dose: 20 mg     Last MAR action:  Given Niel Hummer, [facility]    01/02/19 1046 01/02/19 1045  albuterol (PROVENTIL) (2.5 MG/3ML) 0.083% nebulizer solution 2.5 mg  RT - Once     Route: Nebulization  Ordered Dose: 2.5 mg     Last MAR action:  Given Niel Hummer, [facility]    01/02/19 1046 01/02/19 1045  ipratropium (ATROVENT) 0.02 % nebulizer solution 0.5 mg  RT - Once     Route: Nebulization  Ordered Dose: 0.5 mg     Last MAR action:  Given Niel Hummer, [facility]    01/02/19 1046 01/02/19 1045  lidocaine viscous (XYLOCAINE) 2 % solution 10 mL  Once     Route: Mouth/Throat  Ordered Dose: 10 mL     Last MAR action:  Given Avel Sensor    01/02/19 1046 01/02/19 1045  alum & mag hydroxide-simethicone (MAALOX PLUS) 200-200-20 mg/5 mL suspension 20 mL  Once     Route: Oral  Ordered Dose: 20 mL     Last MAR action:  Given Brendt Dible          Labs Reviewed   URINALYSIS REFLEX TO MICROSCOPIC EXAM - REFLEX TO CULTURE - Abnormal; Notable for the following components:       Result  Value    Leukocyte Esterase, UA Small (*)     Blood, UA Small (*)     All other components within normal limits    Narrative:     Replace urinary catheter prior to obtaining the urine culture  if it has been in place for greater than or equal to  14  days:->N/A No Foley  Indications for U/A Reflex to Micro - Reflex to  Culture:->Suprapubic Pain/Tenderness or Dysuria   RAPID INFLUENZA A/B ANTIGENS    Narrative:     ORDER#: [id number]                                    ORDERED BY: Avel Sensor  SOURCE: Nasal Aspirate                               COLLECTED:  01/02/19 10:17  ANTIBIOTICS AT COLL.:                                RECEIVED :  01/02/19 10:23  Influenza Rapid Antigen A&B                FINAL       01/02/19 10:39  01/02/19   Negative for Influenza A and B             Reference Range: Negative     URINE CULTURE    Narrative:     Replace urinary catheter prior to obtaining the urine culture  if it has been in place for greater than or equal to 14  days:->N/A No Foley  Indications for Urine Culture:->Acute neurologic change  without other likely cause  ORDER#: [id number]                                    ORDERED BY: Avel Sensor  SOURCE: Urine, Clean Catch                           COLLECTED:  01/02/19 11:15  ANTIBIOTICS AT COLL.:                                RECEIVED :  01/02/19 18:31  Culture Urine                              FINAL       01/03/19 16:18  01/03/19   1,000 - 9,000 CFU/ML of normal urogenital or skin microbiota, no further             work              MDM  Adult ECG Report (reviewed by me)    Rate: 63 bpm  PR Interval: 158 ms  QRS Duration: 78 ms  QTc: 392 ms  Narrative Interpretation: NSR, no STEMI      PULSE OXIMETRY    Oxygen Saturation by Pulse Oximetry: 99%  Interpretation:  Normal   Interventions: none                     Procedures    Clinical Impression & Disposition     Clinical Impression  Final diagnoses:   Gastroesophageal reflux disease, esophagitis presence not specified    Bronchitis        ED Disposition     ED Disposition Condition Date/Time Comment    Discharge  Mon Jan 02, 2019 12:49 PM [name]  discharge to home/self care.    Condition at disposition: Stable           Discharge Medication List as of 01/02/2019 12:49 PM      START taking these medications    Details   albuterol (PROVENTIL HFA;VENTOLIN HFA) 108 (90 Base) MCG/ACT inhaler Inhale 2 puffs into the lungs every 4 (four) hours as needed for Wheezing or Shortness of Breath (coughing) Dispense with spacer, Starting Mon 01/02/2019, Until Wed 02/01/2019, Normal      benzonatate (TESSALON) 100 MG capsule Take 1 capsule (100 mg total) by mouth 3 (three) times daily as needed for Cough, Starting Mon 01/02/2019, Normal      metoclopramide (REGLAN) 5 MG tablet Take 1 tablet (5 mg total) by mouth 4 (four) times daily as needed (nausea or abd pain), Starting Mon 01/02/2019, Normal                   Avel Sensor, MD  01/10/19 1623

## 2019-01-02 NOTE — ED Triage Notes (Signed)
Patient c/o flu like symptoms onset 4 days. Patient reports exposed to people at church with cough. Patient denies taking cough medication.

## 2019-01-03 ENCOUNTER — Encounter (INDEPENDENT_AMBULATORY_CARE_PROVIDER_SITE_OTHER): Payer: Self-pay

## 2019-01-03 LAB — ECG 12-LEAD
Atrial Rate: 63 {beats}/min
P Axis: 50 degrees
P-R Interval: 158 ms
Q-T Interval: 384 ms
QRS Duration: 78 ms
QTC Calculation (Bezet): 392 ms
R Axis: 9 degrees
T Axis: 27 degrees
Ventricular Rate: 63 {beats}/min

## 2019-01-04 ENCOUNTER — Other Ambulatory Visit (INDEPENDENT_AMBULATORY_CARE_PROVIDER_SITE_OTHER): Payer: Self-pay | Admitting: Internal Medicine

## 2019-01-04 DIAGNOSIS — S32010D Wedge compression fracture of first lumbar vertebra, subsequent encounter for fracture with routine healing: Secondary | ICD-10-CM

## 2019-01-04 MED ORDER — ALENDRONATE SODIUM 70 MG PO TABS
70.0000 mg | ORAL_TABLET | ORAL | 1 refills | Status: AC
Start: 2019-01-04 — End: 2019-04-04

## 2019-01-04 NOTE — Progress Notes (Signed)
DEXA scan shows osteoporosis    Rec:  1. Start fosamax  2. Take vitamin D supplement 5000 units daily, calcium 1200mg  daily    rx fosamax sent to pharmacy

## 2019-01-05 ENCOUNTER — Encounter (INDEPENDENT_AMBULATORY_CARE_PROVIDER_SITE_OTHER): Payer: Self-pay | Admitting: Internal Medicine

## 2019-01-05 ENCOUNTER — Ambulatory Visit (INDEPENDENT_AMBULATORY_CARE_PROVIDER_SITE_OTHER): Payer: Medicare Other | Admitting: Internal Medicine

## 2019-01-05 VITALS — BP 149/79 | HR 62 | Temp 97.9°F | Ht 61.0 in | Wt 138.0 lb

## 2019-01-05 DIAGNOSIS — I10 Essential (primary) hypertension: Secondary | ICD-10-CM

## 2019-01-05 DIAGNOSIS — J0100 Acute maxillary sinusitis, unspecified: Secondary | ICD-10-CM

## 2019-01-05 MED ORDER — AZITHROMYCIN 250 MG PO TABS
ORAL_TABLET | ORAL | 0 refills | Status: AC
Start: 2019-01-05 — End: 2019-01-11

## 2019-01-05 NOTE — Progress Notes (Signed)
Subjective:      Patient ID: Amber [name] is a 68 y.o. female     Chief Complaint   Patient presents with   . Urgent Care F/u     Pt was seen at [facility] and dx'ed with bronchitis and viral syndrome         HPI     Pt presents for urgent care follow-up.    Bronchitis- Pt seen at [facility] ED 01/02/19. Initial Sx of cough with pink phlegm, dark urine, wheezing. Dx with bronchitis, viral syndrome.  rx'd tessalon perles, albuterol.  Pt reports nausea, L side sinus pain, poor appetite today.    HTN- Blood pressure today is 149/79. Patient is compliant with amlodipine 5 mg, atenolol 25 mg. No chest pain or shortness of breath.  No home BP    The following sections were reviewed this encounter by the provider: Meds           Review of Systems   Constitutional: Positive for appetite change. Negative for activity change, fever and unexpected weight change.   HENT: Positive for sinus pain.    Eyes: Negative for visual disturbance.   Respiratory: Negative for shortness of breath.    Cardiovascular: Negative for chest pain and palpitations.   Gastrointestinal: Positive for nausea. Negative for abdominal pain, constipation, diarrhea and vomiting.   Genitourinary: Negative for difficulty urinating.   Musculoskeletal: Negative for myalgias.   Neurological: Negative for syncope, weakness, numbness and headaches.   Psychiatric/Behavioral: Negative for sleep disturbance.          BP 149/79   Pulse 62   Temp 97.9 F (36.6 C) (Oral)   Ht 1.549 m (5\' 1" )   Wt 62.6 kg (138 lb)   BMI 26.07 kg/m     Objective:     Physical Exam  Vitals signs reviewed.   Constitutional:       Appearance: Normal appearance. She is overweight.   HENT:      Head: Normocephalic and atraumatic.      Nose:      Right Sinus: No maxillary sinus tenderness or frontal sinus tenderness.      Left Sinus: Maxillary sinus tenderness and frontal sinus tenderness present.      Mouth/Throat:      Mouth: Mucous membranes are moist.      Pharynx: Oropharynx  is clear.   Eyes:      Conjunctiva/sclera: Conjunctivae normal.   Neck:      Musculoskeletal: Neck supple.      Vascular: No carotid bruit.   Cardiovascular:      Rate and Rhythm: Normal rate and regular rhythm.      Pulses: Normal pulses.      Heart sounds: Normal heart sounds. No murmur. No friction rub. No gallop.    Pulmonary:      Effort: Pulmonary effort is normal. No respiratory distress.      Breath sounds: Normal breath sounds. No wheezing, rhonchi or rales.   Abdominal:      General: Bowel sounds are normal. There is no distension.      Palpations: Abdomen is soft.      Tenderness: There is no abdominal tenderness. There is no guarding or rebound.   Lymphadenopathy:      Cervical: No cervical adenopathy.   Skin:     General: Skin is warm and dry.   Neurological:      Mental Status: She is alert.   Psychiatric:  Mood and Affect: Mood normal.         Behavior: Behavior normal.          Assessment:     1. Acute maxillary sinusitis, recurrence not specified  - azithromycin (ZITHROMAX) 250 MG tablet; Take 2 tablets (500 mg) on  Day 1,  followed by 1 tablet (250 mg) once daily on Days 2 through 5.  Dispense: 6 tablet; Refill: 0    2. Essential hypertension        Plan:     Continue current medications.  Start Zpak  otc antitussive prn  Keep hydrated  Monitor BP  F/u if sx persistent.   F/u 4 wks.       By signing my name below, I, Gerrit Friends, Scribe, attest that this documentation has been prepared under the direction and in the presence of Myrtie Neither, MD.    I have reviewed the chart and agree that the record accurately reflects my personal performance of the history, physical exam, discussion and plan.    Excell Seltzer, MD  [facility], [facility]  [address]  [place], [place] [address]  [phone]

## 2019-01-06 ENCOUNTER — Encounter (INDEPENDENT_AMBULATORY_CARE_PROVIDER_SITE_OTHER): Payer: Self-pay

## 2019-01-08 ENCOUNTER — Encounter (INDEPENDENT_AMBULATORY_CARE_PROVIDER_SITE_OTHER): Payer: Self-pay

## 2019-01-09 ENCOUNTER — Encounter (INDEPENDENT_AMBULATORY_CARE_PROVIDER_SITE_OTHER): Payer: Self-pay

## 2019-01-12 ENCOUNTER — Other Ambulatory Visit (HOSPITAL_BASED_OUTPATIENT_CLINIC_OR_DEPARTMENT_OTHER): Payer: Self-pay

## 2019-01-17 ENCOUNTER — Other Ambulatory Visit (INDEPENDENT_AMBULATORY_CARE_PROVIDER_SITE_OTHER): Payer: Self-pay | Admitting: Internal Medicine

## 2019-01-17 DIAGNOSIS — A6000 Herpesviral infection of urogenital system, unspecified: Secondary | ICD-10-CM

## 2019-01-17 MED ORDER — VALACYCLOVIR HCL 500 MG PO TABS
ORAL_TABLET | ORAL | 1 refills | Status: DC
Start: 2019-01-17 — End: 2020-03-01

## 2019-01-19 ENCOUNTER — Other Ambulatory Visit (HOSPITAL_BASED_OUTPATIENT_CLINIC_OR_DEPARTMENT_OTHER): Payer: Self-pay

## 2019-01-19 HISTORY — PX: BREAST BIOPSY: SHX5072

## 2019-01-20 ENCOUNTER — Ambulatory Visit (INDEPENDENT_AMBULATORY_CARE_PROVIDER_SITE_OTHER): Payer: Medicare Other | Admitting: Surgery

## 2019-01-20 ENCOUNTER — Encounter (INDEPENDENT_AMBULATORY_CARE_PROVIDER_SITE_OTHER): Payer: Self-pay

## 2019-01-20 ENCOUNTER — Encounter (INDEPENDENT_AMBULATORY_CARE_PROVIDER_SITE_OTHER): Payer: Self-pay | Admitting: Surgery

## 2019-01-20 VITALS — BP 147/82 | HR 77 | Temp 97.7°F | Ht 61.0 in | Wt 136.0 lb

## 2019-01-20 DIAGNOSIS — R10816 Epigastric abdominal tenderness: Secondary | ICD-10-CM

## 2019-01-20 DIAGNOSIS — Z1321 Encounter for screening for nutritional disorder: Secondary | ICD-10-CM

## 2019-01-20 DIAGNOSIS — Z9884 Bariatric surgery status: Secondary | ICD-10-CM

## 2019-01-20 DIAGNOSIS — R110 Nausea: Secondary | ICD-10-CM

## 2019-01-20 NOTE — Progress Notes (Signed)
ASSESSMENT:  History of Roux-en-Y gastric bypass in 2016.    History of diagnostic laparoscopy and lysis of adhesions for small bowel obstruction in November 2019.  Nausea with epigastric tenderness   PLAN:  Patient will be scheduled for a upper endoscopy to rule out anastomotic ulcer or stricture.  Patient is to restart her Carafate and continue with her Protonix.  Patient will need a full set of labs including vitamin levels  Patient is to follow-up with me after the endoscopy.      SUBJECTIVE:  The patient is a 68 y.o.  female who presents status post laparoscopic Roux-en-Y gastric bypass in 2016.  Patient also had a diagnostic laparoscopy with lysis of adhesions for small bowel obstruction November 2019.  Patient has been complaining of epigastric pain with nausea and occasional small emesis.  She states that she also has the feeling of something is getting stuck.  She denies any fevers or chills.  She is having regular bowel movements.        The following portions of the patient's history were reviewed and updated as appropriate: allergies, current medications, past family history, past medical history, past social history, past surgical history and problem list.    CURRENT PROBLEM LIST:   Patient Active Problem List   Diagnosis   . Essential hypertension   . Anxiety state, unspecified   . Abnormal electrocardiogram   . Cerebral infarction   . Gastroesophageal reflux disease   . Hypernatremia   . Paresthesia   . Slow transit constipation   . Vitamin D deficiency   . Overactive bladder   . S/P laparoscopic cholecystectomy   . Transient cerebral ischemia, unspecified type   . Palpitations   . Pituitary microadenoma   . Dyslipidemia   . Other long term (current) drug therapy   . Headache   . History of spinal surgery   . Obstructive sleep apnea syndrome   . Hiatal hernia   . Osteoarthritis of knees, bilateral   . Bariatric surgery status   . Nutritional deficiency   . Anxiety   . Epigastric abdominal tenderness  without rebound tenderness   . Encounter for vitamin deficiency screening   . Nausea   . Other dysphagia   . Small bowel obstruction due to adhesions   . Allergic rhinitis   . Chronic frontal sinusitis   . Chronic sphenoidal sinusitis   . Nasal obstruction   . Sinusitis   . Primary osteoarthritis of both knees     PAST MEDICAL HISTORY:   Past Medical History:   Diagnosis Date   . Asthma    . Chest pain 2016     Chest pain after eating x6  months--being followed by GI for GERD   . Constipation    . Difficulty walking     amb with cane secondary to arthritis bil knees   . Genital herpes     no current outbreak (10/03/15)   . GERD (gastroesophageal reflux disease)    . Hiatal hernia     h/o surgery   . Hyperlipidemia    . Hypertensive disorder     Well controlled on med. Denies any SOB in last 6 months (10/03/15)   . Low back pain    . Morbid obesity with BMI of 40.0-44.9, adult     BMI 40.9   . Nausea without vomiting    . OSA on CPAP 2015    CPAP nightly x 1 yr   . Osteoarthritis of knees, bilateral  and back   . TIA (transient ischemic attack) 2014    TIA 2014-no residual. Patient evaulated for possible TIA 07/12/2015  @ [facility] (dischage summary in epic)-per summary CT of head was normal and patient was d/c'd     PAST SURGICAL HISTORY:   Past Surgical History:   Procedure Laterality Date   . APPENDECTOMY  >25 yrs   . CHOLECYSTECTOMY     . EGD  01/2015   . EGD, BIOPSY N/A 09/15/2017    Procedure: EGD, BIOPSY;  Surgeon: Pershing Proud, MD;  Location: [facility];  Service: Gastroenterology;  Laterality: N/A;   . fallopian tube surgery  >25 yrs   . INSERTION, PAIN PUMP (MEDICAL) N/A 10/22/2015    Procedure: INSERTION, PAIN PUMP (MEDICAL);  Surgeon: Nicola Police, DO;  Location: [place] [facility];  Service: General;  Laterality: N/A;   . LAPAROSCOPIC, CHOLECYSTECTOMY, CHOLANGIOGRAM  08/13/2014   . LAPAROSCOPIC, GASTRIC BYPASS N/A 10/22/2015    Procedure: LAPAROSCOPIC, GASTRIC BYPASS;  Surgeon: Jeanell Sparrow,  Kinsler Soeder R, DO;  Location: [place] [facility];  Service: General;  Laterality: N/A;   . LAPAROSCOPIC, HERNIORRHAPHY, HIATAL N/A 10/22/2015    Procedure: LAPAROSCOPIC, HERNIORRHAPHY, HIATAL;  Surgeon: Nicola Police, DO;  Location: [place] [facility];  Service: General;  Laterality: N/A;   . LAPAROSCOPIC, LYSIS, ADHESIONS N/A 09/01/2018    Procedure: LAPAROSCOPIC, LYSIS, ADHESIONS RELEASE OF  SMALL BOWEL OBSTRUCTION;  Surgeon: Champ Mungo, MD;  Location:  MAIN OR;  Service: General;  Laterality: N/A;   . LAPAROSCOPY, DIAGNOSTIC N/A 09/01/2018    Procedure: LAPAROSCOPY, DIAGNOSTIC;  Surgeon: Champ Mungo, MD;  Location:  MAIN OR;  Service: General;  Laterality: N/A;   . OVARY SURGERY Right >25 yrs   . SPINE SURGERY  11/2013    cervical HNP x 2, Dr. Bufford Buttner     TOBACCO HISTORY:   Social History     Tobacco Use   Smoking Status Never Smoker   Smokeless Tobacco Never Used     ALCOHOL HISTORY:   Social History     Substance and Sexual Activity   Alcohol Use No     DRUG HISTORY:   Social History     Substance and Sexual Activity   Drug Use No     CURRENT OUTPATIENT MEDICATIONS:   Outpatient Medications Marked as Taking for the 01/20/19 encounter (Office Visit) with Samone Guhl R, DO   Medication Sig Dispense Refill   . albuterol (PROVENTIL HFA;VENTOLIN HFA) 108 (90 Base) MCG/ACT inhaler Inhale 2 puffs into the lungs every 4 (four) hours as needed for Wheezing or Shortness of Breath (coughing) Dispense with spacer 1 Inhaler 0   . alendronate (FOSAMAX) 70 MG tablet Take 1 tablet (70 mg total) by mouth once a week Take 1 tab by mouth every 7 days with a full glass of water on an empty stomach. Do not take anything else by mouth or lie down for 30 mins. 12 tablet 1   . aspirin EC 81 MG EC tablet Take 1 tablet (81 mg total) by mouth daily. 30 tablet 1   . atenolol (TENORMIN) 25 MG tablet Take 1 tablet (25 mg total) by mouth daily 90 tablet 1   . fluticasone (FLONASE) 50 MCG/ACT nasal spray 2 sprays by Nasal  route daily. (Patient taking differently: 2 sprays by Nasal route as needed.   ) 16 g 2   . metoclopramide (REGLAN) 5 MG tablet Take 1 tablet (5 mg total) by mouth 4 (  four) times daily as needed (nausea or abd pain) 10 tablet 0   . Multiple Vitamin (MULTIVITAMIN) capsule Take 1 capsule by mouth daily     . nystatin (NYSTOP) powder Apply to affected area 3 times daily 60 g 1   . pantoprazole (PROTONIX) 40 MG tablet TAKE 1 TABLET(40 MG) BY MOUTH DAILY 90 tablet 1   . valACYclovir HCL (VALTREX) 500 MG tablet TAKE 1 TABLET(500 MG) BY MOUTH DAILY 90 tablet 1     ALLERGIES:   Allergies   Allergen Reactions   . Acyclovir Nausea And Vomiting     Other reaction(s): gi distress   . Amlodipine      nausea   . Amoxicillin-Pot Clavulanate      Other reaction(s): gi distress     . Aspirin Nausea And Vomiting     Patient states "it upsets my stomach"  Other reaction(s): gi distress  Upsets stomach  Able to tolerate enteric coated aspirin   . Atorvastatin        Palpitation   . Carvedilol      Extreme fatigue   . Lisinopril      nausea   . Lovastatin Nausea And Vomiting     Other reaction(s): gi distress   . Metronidazole Nausea And Vomiting     Other reaction(s): gi distress   . Moxifloxacin Nausea And Vomiting         GI symptoms   . Other Nausea And Vomiting   . Rosuvastatin      Palpitation, chest pain, abd pain    . Statins    . Sulfa Antibiotics Nausea And Vomiting     Other reaction(s): gi distress       OBJECTIVE:  BP 147/82   Pulse 77   Temp 97.7 F (36.5 C) (Oral)   Ht 5\' 1"    Wt 136 lb   BMI 25.70 kg/m     General appearance: alert, appears stated age and cooperative  Head: Normocephalic, without obvious abnormality, atraumatic  Abdomen: Epigastric tenderness with minimal guarding.  There is no peritoneal signs.  The abdomen is soft, nondistended, positive bowel sounds      No results found.      Nila Winker R. Keionte Swicegood, DO, FACS, FASMBS, FACOS

## 2019-01-20 NOTE — Pre-Procedure Instructions (Signed)
Low risk sx  No preop testing needed/ordered per Anesthesia Guidelines.  H&P 01/20/2019  Cardio LOV 11/23/2018  PCP LOV 01/05/2019 recent bronchitis.  EKG 01/02/2019 WNL  Echo 11/05/2017 EF 65%  ASA: pt instructed to call Cardio MD for instructions.

## 2019-01-20 NOTE — Pre-Procedure Instructions (Signed)
Day Before Surgery Update: PSS interview completed today. no day before call required.

## 2019-01-23 ENCOUNTER — Ambulatory Visit: Payer: Self-pay

## 2019-01-23 ENCOUNTER — Encounter
Admission: RE | Disposition: A | Discharge status: Home / Self Care | Payer: Self-pay | Source: Ambulatory Visit | Attending: Surgery

## 2019-01-23 ENCOUNTER — Ambulatory Visit
Admission: RE | Admit: 2019-01-23 | Discharge: 2019-01-23 | Disposition: A | Discharge status: Home / Self Care | Payer: Medicare Other | Source: Ambulatory Visit | Attending: Surgery | Admitting: Surgery

## 2019-01-23 ENCOUNTER — Ambulatory Visit: Payer: Medicare Other | Admitting: Registered Nurse

## 2019-01-23 DIAGNOSIS — G4733 Obstructive sleep apnea (adult) (pediatric): Secondary | ICD-10-CM | POA: Insufficient documentation

## 2019-01-23 DIAGNOSIS — K2950 Unspecified chronic gastritis without bleeding: Secondary | ICD-10-CM | POA: Insufficient documentation

## 2019-01-23 DIAGNOSIS — R10816 Epigastric abdominal tenderness: Secondary | ICD-10-CM

## 2019-01-23 DIAGNOSIS — Z7982 Long term (current) use of aspirin: Secondary | ICD-10-CM | POA: Insufficient documentation

## 2019-01-23 DIAGNOSIS — K449 Diaphragmatic hernia without obstruction or gangrene: Secondary | ICD-10-CM | POA: Insufficient documentation

## 2019-01-23 DIAGNOSIS — R112 Nausea with vomiting, unspecified: Secondary | ICD-10-CM | POA: Insufficient documentation

## 2019-01-23 DIAGNOSIS — K219 Gastro-esophageal reflux disease without esophagitis: Secondary | ICD-10-CM | POA: Insufficient documentation

## 2019-01-23 DIAGNOSIS — I10 Essential (primary) hypertension: Secondary | ICD-10-CM | POA: Insufficient documentation

## 2019-01-23 DIAGNOSIS — E785 Hyperlipidemia, unspecified: Secondary | ICD-10-CM | POA: Insufficient documentation

## 2019-01-23 DIAGNOSIS — Z8673 Personal history of transient ischemic attack (TIA), and cerebral infarction without residual deficits: Secondary | ICD-10-CM | POA: Insufficient documentation

## 2019-01-23 DIAGNOSIS — R1310 Dysphagia, unspecified: Secondary | ICD-10-CM | POA: Insufficient documentation

## 2019-01-23 DIAGNOSIS — Z9884 Bariatric surgery status: Secondary | ICD-10-CM | POA: Insufficient documentation

## 2019-01-23 DIAGNOSIS — J45909 Unspecified asthma, uncomplicated: Secondary | ICD-10-CM | POA: Insufficient documentation

## 2019-01-23 DIAGNOSIS — R110 Nausea: Secondary | ICD-10-CM

## 2019-01-23 DIAGNOSIS — K565 Intestinal adhesions [bands], unspecified as to partial versus complete obstruction: Secondary | ICD-10-CM

## 2019-01-23 DIAGNOSIS — R1319 Other dysphagia: Secondary | ICD-10-CM

## 2019-01-23 HISTORY — DX: Claustrophobia: F40.240

## 2019-01-23 HISTORY — PX: EGD: SHX3789

## 2019-01-23 SURGERY — DONT USE, USE 1095-ESOPHAGOGASTRODUODENOSCOPY (EGD), DIAGNOSTIC
Anesthesia: Anesthesia General | Site: Abdomen | Wound class: Clean Contaminated

## 2019-01-23 MED ORDER — SODIUM CHLORIDE 0.9 % IV SOLN
INTRAVENOUS | Status: DC
Start: 2019-01-23 — End: 2019-01-23

## 2019-01-23 MED ORDER — LACTATED RINGERS IV SOLN
INTRAVENOUS | Status: DC
Start: 2019-01-23 — End: 2019-01-23

## 2019-01-23 MED ORDER — FENTANYL CITRATE (PF) 50 MCG/ML IJ SOLN (WRAP)
INTRAMUSCULAR | Status: DC | PRN
Start: 2019-01-23 — End: 2019-01-23
  Administered 2019-01-23 (×2): 25 ug via INTRAVENOUS

## 2019-01-23 MED ORDER — PROPOFOL 10 MG/ML IV EMUL (WRAP)
INTRAVENOUS | Status: DC | PRN
Start: 2019-01-23 — End: 2019-01-23
  Administered 2019-01-23 (×4): 50 mg via INTRAVENOUS

## 2019-01-23 MED ORDER — PROPOFOL 10 MG/ML IV EMUL (WRAP)
INTRAVENOUS | Status: AC
Start: 2019-01-23 — End: ?
  Filled 2019-01-23: qty 20

## 2019-01-23 MED ORDER — FENTANYL CITRATE (PF) 50 MCG/ML IJ SOLN (WRAP)
INTRAMUSCULAR | Status: AC
Start: 2019-01-23 — End: ?
  Filled 2019-01-23: qty 2

## 2019-01-23 MED ORDER — LIDOCAINE HCL 2 % IJ SOLN
INTRAMUSCULAR | Status: DC | PRN
Start: 2019-01-23 — End: 2019-01-23
  Administered 2019-01-23: 60 mg via INTRAVENOUS

## 2019-01-23 MED ORDER — LIDOCAINE HCL 2 % IJ SOLN
INTRAMUSCULAR | Status: AC
Start: 2019-01-23 — End: ?
  Filled 2019-01-23: qty 20

## 2019-01-23 SURGICAL SUPPLY — 52 items
BALLOON CRE DILTR 12-15MMX8CM (Balloons)
BITE BLOCK MAXI 60F LATEX FREE (Procedure Accessories) ×1
BLOCK BITE OD60 FR STURDY STRAP SIDEPORT (Procedure Accessories) ×1 IMPLANT
BLOCK BITE OD60 FR STURDY STRAP SIDEPORT DENTAL RETENTION RIM MAXI (Procedure Accessories) ×1 IMPLANT
CATH BLN DIL BOSCI 15-18MMX8CM (Balloons)
CATH CRE 6F 10-12 8X180CM (Balloons)
CATH GOLD PROBE HEMO 7F 300CM (Procedure Accessories)
CATHETER BALLOON DILATATION CRE 2.8 MM (Balloons) ×2 IMPLANT
CATHETER BALLOON DILATATION CRE PEBAX (Balloons) IMPLANT
CATHETER OD10-11-12 MM ODSEC6 FR L180 CM CRE BALLOON DILATATION L8 CM (Balloons) IMPLANT
CATHETER OD10-11-12 MM ODSEC6 FR L180 CM CREâ„¢ BALLOON DILATATION L8 CM (Balloons) IMPLANT
CATHETER OD15-16.5-18 MM ODSEC6 FR L180 CM CRE BALLOON DILATATION L8 (Balloons) IMPLANT
CATHETER OD15-16.5-18 MM ODSEC6 FR L180 CM CREâ„¢ BALLOON DILATATION L8 (Balloons) IMPLANT
CATHETER OD6 FR ODSEC12-13.5-15 MM L180 CM CRE BALLOON DILATATION L8 (Balloons) IMPLANT
CATHETER OD6 FR ODSEC12-13.5-15 MM L180 CM CREâ„¢ BALLOON DILATATION L8 (Balloons) IMPLANT
CATHETER OD6-7-8 MM ODSEC6 FR L180 CM CRE BALLOON DILATATION L8 CM (Balloons) ×1 IMPLANT
CATHETER OD6-7-8 MM ODSEC6 FR L180 CM CREâ„¢ BALLOON DILATATION L8 CM (Balloons) ×1 IMPLANT
CATHETER OD7 FR L300 CM BIPOLAR ROUND (Procedure Accessories) IMPLANT
CATHETER OD7 FR L300 CM BIPOLAR ROUND DISTAL TIP STANDARD CONNECTOR (Procedure Accessories) IMPLANT
CATHETER OD8-9-10 MM ODSEC6 FR L180 CM CRE BALLOON DILATATION L8 CM (Balloons) ×1 IMPLANT
CATHETER OD8-9-10 MM ODSEC6 FR L180 CM CREâ„¢ BALLOON DILATATION L8 CM (Balloons) ×1 IMPLANT
DILATOR WREGDE  BLLN 6-8MM (Balloons) ×1
DILATOR WREGDE  BLLN 8-10MM (Balloons) ×1
ELECTRODE ADULT PATIENT RETURN L9 FT REM POLYHESIVE ACRYLIC FOAM (Procedure Accessories) IMPLANT
ELECTRODE PATIENT RETURN L9 FT VALLEYLAB (Procedure Accessories) IMPLANT
FORCEPS BIOPSY L240 CM LARGE CAPACITY (Instrument) ×1 IMPLANT
FORCEPS BIOPSY L240 CM MICROMESH TEETH STREAMLINE CATHETER NEEDLE (Instrument) ×1 IMPLANT
FORCEPS RAD JAW 4 BIOSPY W/NDL (Instrument) ×1
GAUZE SPONGE 4X4 NS (Dressing) ×1
GLOVE EXAM LARGE NITRILE CHEMOTHERAPY POWDER FREE SENSE OATMEAL (Glove) ×2 IMPLANT
GLOVE EXAM LARGE NITRILE POWDER FREE SENSE OATMEAL (Glove) ×2 IMPLANT
GLOVE EXAM NITRILE RESTORE LG (Glove) ×2 IMPLANT
GLV EXAM NITRILE RESTORE LG (Glove) ×4
GOWN CP ELSTC WRIST REG/LG BL (Gown) ×2
GOWN ISL PP PE REG LG LF FULL BCK NK TIE (Gown) ×2 IMPLANT
GOWN ISOLATION REGULAR LARGE FULL BACK NECK TIE ELASTIC CUFF (Gown) ×2 IMPLANT
PAD ELECTROSRG GRND REM W CRD (Procedure Accessories)
PROBE ELECTROSURGICAL L220 CM FLEXIBLE (Procedure Accessories) IMPLANT
PROBE ELECTROSURGICAL L220 CM FLEXIBLE STRAIGHT FIRE OD2.3 MM FIAPC (Procedure Accessories) IMPLANT
PROBE FIAPC STRGHT FIRE 220CM (Procedure Accessories)
SNARE CAPTIVATOR 13MMX240CM (GE Lab Supplies)
SNARE ESCP MIC CPTVTR 13MM 240IN STRL (GE Lab Supplies) IMPLANT
SNARE SMALL HEXAGON CAPTIVATOR STIFF ENDOSCOPIC POLYPECTOMY (GE Lab Supplies) IMPLANT
SPONGE GAUZE L4 IN X W4 IN 16 PLY (Dressing) ×1 IMPLANT
SPONGE GAUZE L4 IN X W4 IN 16 PLY MAXIMUM ABSORBENT USP TYPE VII (Dressing) ×1 IMPLANT
SYRINGE 50 ML GRADUATE NONPYROGENIC DEHP (Syringes, Needles) IMPLANT
SYRINGE 50 ML GRADUATE NONPYROGENIC DEHP FREE PVC FREE BD MEDICAL (Syringes, Needles) IMPLANT
SYRINGE INFLATION 60 ML GAUGE CRE (Syringes, Needles) IMPLANT
SYRINGE INFLATION GAUGE 60CC (Syringes, Needles)
SYRINGE SLIP-TIP 60CC (Syringes, Needles)
WATER STERILE PLASTIC POUR BOTTLE 250 ML (Irrigation Solutions) ×1 IMPLANT
WATER STRL IRRIG 250ML BTL (Irrigation Solutions) ×1

## 2019-01-23 NOTE — Anesthesia Postprocedure Evaluation (Signed)
Anesthesia Post Evaluation    Patient: Amber [name]    Procedure(s) with comments:  EGD - ENDOSCOPY  MD REQ=30MINS; Q1=UNK    Anesthesia type: No value filed.    Last Vitals:   Vitals Value Taken Time   BP 167/80 01/23/2019  8:50 AM   Temp 36 C (96.8 F) 01/23/2019  7:52 AM   Pulse 65 01/23/2019  8:50 AM   Resp 18 01/23/2019  8:50 AM   SpO2 100 % 01/23/2019  8:50 AM                 Anesthesia Post Evaluation:     Patient Evaluated: PACU  Patient Participation: complete - patient participated  Level of Consciousness: awake  Pain Score: 0  Pain Management: adequate    Airway Patency: patent    Anesthetic complications: No      PONV Status: none    Cardiovascular status: stable  Respiratory status: room air  Hydration status: stable        Signed by: Juanell Fairly, 01/23/2019 8:57 AM

## 2019-01-23 NOTE — Discharge Instructions (Signed)
Stay on schedule with your medication.   Going Home  Your doctor or nurse will show you how to take care of yourself when you go home. He or she will also answer your questions. Have an adult family member or friend drive you home. For the first 24 hours after your procedure:   Do not drive or use heavy equipment.   Do not make important decisions or sign documents.   Avoid alcohol.   Have someone stay with you, if possible. They can watch for problems and help keep you safe.  Be sure to keep all follow-up doctor's appointments. And rest after your procedure for as long as your doctor tells you to.    Coping with Pain  If you have pain after your procedure, pain medication will help you feel better. Take it as directed, before pain becomes severe. Also, ask your doctor or pharmacist about other ways to control pain, such as with heat, ice, and relaxation. And follow any other instructions your surgeon or nurse gives you.    Tips for Taking Pain Medication  To get the best relief possible, remember these points:   Pain medications can upset your stomach. Taking them with a little food may help.   Most pain relievers taken by mouth need at least 20 to 30 minutes to take effect.   Taking medication on a schedule can help you remember to take it. Try to time your medication so that you can take it before beginning an activity, such as dressing, walking, or sitting down for dinner.   Constipation is a common side effect of pain medications. Drink lots of fluids. Eating fruit and vegetables can also help. Don't take laxatives unless your surgeon has prescribed them.   Mixing alcohol and pain medication can cause dizziness and slow your breathing. It can even be fatal. Don't drink alcohol while taking pain medication.   Pain medication can slow your reflexes. Don't drive or operate machinery while taking pain medication,    Managing Nausea  Some people have an upset stomach after surgery. This is often due  to anesthesia, pain, pain medications, or the stress of surgery. The following tips will help you manage nausea and get good nutrition as you recover. If you were on a special diet before surgery, ask your doctor if you should follow it during recovery. These tips may help:   Don't push yourself to eat. Your body will tell you what to eat and when.   Start off with liquids and soup. They are easier to digest.   Progress to semisolids (mashed potatoes, applesauce, and gelatin) as you feel ready.   Slowly move to solid foods. Don't eat fatty, rich, or spicy foods at first.   Don't force yourself to have three large meals a day. Instead, eat smaller amounts more often.   Take pain medications with a small amount of solid food, such as crackers or toast.    Important:   Prevent blood clots by wiggling your toes and tightening your calf muscle 20 times every hour while awake until you resume normal walking activity.   If unable to urinate in 6-8 hours, call your surgeon or go to the Emergency Department.     Call Your Surgeon If.   You still have pain an hour after taking medication (it may not be strong enough).   You feel too sleepy, dizzy, or groggy (medication may be too strong).   You have side effects like nausea, vomiting,  or skin changes (rash, itching, or hives).   Signs of infection - fever over 101, chills. Incision site redness, warmth, swelling, foul odor or purulent drainage.   Persistent bleeding at the operative site.    2000-2011 [employer], [address], [place] [address]. All rights reserved. This information is not intended as a substitute for professional medical care. Always follow your healthcare professional's instructions.

## 2019-01-23 NOTE — H&P (Signed)
Drs. Fermin Schwab, [place], and [place]   [address]   [place], [place] [address]   [phone]   [phone]     [address], [address]  [place], [place] [address]   [phone]   [phone]    I have examined the patient and reviewed the H&P. No pertinent change in the patient's condition since the H&P was completed.  For EGD with dilation    [name]   8:00 AM 01/23/2019

## 2019-01-23 NOTE — Anesthesia Preprocedure Evaluation (Addendum)
Anesthesia Evaluation    AIRWAY    Mallampati: II    TM distance: >3 FB  Neck ROM: full  Mouth Opening:full  Planned to use difficult airway equipment: No CARDIOVASCULAR    cardiovascular exam normal, regular and normal       DENTAL         PULMONARY    pulmonary exam normal     OTHER FINDINGS                  Relevant Problems   ANESTHESIA   (+) Obstructive sleep apnea syndrome      PULMONARY   (+) Obstructive sleep apnea syndrome      NEURO/PSYCH   (+) Headache      CARDIO   (+) Essential hypertension   (+) Transient cerebral ischemia, unspecified type      GI   (+) Gastroesophageal reflux disease   (+) Hiatal hernia       PSS Anesthesia Comments: Barriatric surgery status, HTN, COPD , BMI 25.7, Hx of cerebral infarct, STOP BANG=5        Anesthesia Plan    ASA 3     general                     intravenous induction   Detailed anesthesia plan: general IV  Monitors/Adjuncts: other    Post Op: other  Trial extubation is not planned.  Post op pain management: per surgeon    informed consent obtained    Plan discussed with CRNA.                   Signed by: Juanell Fairly 01/23/19 8:00 AM

## 2019-01-23 NOTE — Discharge Instr - AVS First Page (Signed)
Instructions for after your discharge:    EGD Discharge Instructions  General Instructions:  1. Following sedation, your judgement, perception, and coordination are considered impaired. Even though you may feel awake and alert, you are considered legally intoxicated. Therefore, until the next morning;  Do not Drive  Do not operate appliances or equipment that requires reaction time (e.g. Stove, electrical tools, machinery)  Do not sign legal documents or be involved in important decisions.  Do not smoke if alone  Do not drink alcoholic beverages  Go directly home and rest for several hours before resuming your routine activities.  It is highly recommended to have a responsible adult stay with you for the  next 24 hours    2. Tenderness, swelling or pain may occur at the IV site where you received sedation. If you experience this, apply warm soaks to the area. Notify your physician if this persists.    Instructions Specific To Procedures - Report To Physician Any Of The Following:    Upper Endoscopy, ERCP, Dilations   1. Pain in Chest   2. Nausea/vomitting   3. Fevers/Chills within 24 hours after procedure. Temp>101deg F   4. Severe and persistent abdominal pain and bloating     In Addition:   Mild throat soreness may follow this procedure. Warm salt water gargling or lozenges of your choice will most likely relieve your discomfort or cold drinks and  popsicles.       Additional Discharge Instructions  Your diet after the procedure: Start with something light (Toast, Jello, Soup, Etc.), Then Resume to Regular Diet as Tolerated. Nothing Spicy, Greasy or Fried Foods for the First Meal. NO RED FLUIDS, FOODS OR SAUCES FOR 24 HOURS.        If you have questions or problems contact your MD immediately. If you need immediate attention, call your MD, 911 and/or go to nearest emergency room.

## 2019-01-23 NOTE — Transfer of Care (Signed)
Anesthesia Transfer of Care Note    Patient: Amber [name]    Procedures performed: Procedure(s) with comments:  EGD - ENDOSCOPY  MD REQ=30MINS; Q1=UNK    Anesthesia type: General TIVA    Patient location:Phase I PACU    Last vitals:   Vitals:    01/23/19 0752   BP: 177/84   Resp: 16   Temp: (!) 36 C (96.8 F)   SpO2: 99%       Post pain: Patient not complaining of pain, continue current therapy      Mental Status:awake    Respiratory Function: tolerating room air    Cardiovascular: stable    Nausea/Vomiting: patient not complaining of nausea or vomiting    Hydration Status: adequate    Post assessment: no apparent anesthetic complications and no reportable events    Signed by: Mia Creek  01/23/19 8:48 AM

## 2019-01-24 ENCOUNTER — Encounter (INDEPENDENT_AMBULATORY_CARE_PROVIDER_SITE_OTHER): Payer: Self-pay

## 2019-01-24 ENCOUNTER — Encounter: Payer: Self-pay | Admitting: Surgery

## 2019-01-24 LAB — LAB USE ONLY - HISTORICAL SURGICAL PATHOLOGY

## 2019-01-27 ENCOUNTER — Ambulatory Visit (INDEPENDENT_AMBULATORY_CARE_PROVIDER_SITE_OTHER): Payer: Commercial Managed Care - PPO | Admitting: Internal Medicine

## 2019-01-31 ENCOUNTER — Ambulatory Visit
Admission: RE | Admit: 2019-01-31 | Discharge: 2019-01-31 | Disposition: A | Discharge status: Home / Self Care | Payer: Medicare Other | Source: Ambulatory Visit | Attending: Surgery | Admitting: Surgery

## 2019-01-31 ENCOUNTER — Other Ambulatory Visit: Payer: Self-pay | Admitting: Surgery

## 2019-01-31 DIAGNOSIS — R10816 Epigastric abdominal tenderness: Secondary | ICD-10-CM | POA: Insufficient documentation

## 2019-01-31 MED ORDER — BARIUM SULFATE 98 % PO SUSR
140.00 mL | Freq: Once | ORAL | Status: AC | PRN
Start: 2019-01-31 — End: 2019-01-31
  Administered 2019-01-31: 09:00:00 140 mL via ORAL

## 2019-01-31 MED ORDER — SOD BICARB-CITRIC AC-SIMETH 2.21-1.53-0.04 G PO PACK
1.00 | PACK | Freq: Once | ORAL | Status: AC | PRN
Start: 2019-01-31 — End: 2019-01-31
  Administered 2019-01-31: 09:00:00 1 via ORAL

## 2019-01-31 MED ORDER — BARIUM SULFATE 60 % PO SUSP
355.00 mL | Freq: Once | ORAL | Status: AC | PRN
Start: 2019-01-31 — End: 2019-01-31
  Administered 2019-01-31: 09:00:00 355 mL via ORAL

## 2019-02-01 ENCOUNTER — Telehealth: Payer: Self-pay | Admitting: Surgery

## 2019-02-01 NOTE — Telephone Encounter (Signed)
Called patient to discuss results of recently performed UGI/SBFT.  Only abnormality on UGI is some reflux.  Patient states doing ok, some abdominal discomfort today which she is attributing to possibly drinking the barium yesterday.  Patient reassured of no worrisome findings on UGI.  Patient to call the office to schedule a follow-up appointment with Dr. Jeanell Sparrow in the next few weeks to discuss next steps.  Patient stated her understanding, all questions were answered.

## 2019-02-08 ENCOUNTER — Encounter (INDEPENDENT_AMBULATORY_CARE_PROVIDER_SITE_OTHER): Payer: Self-pay

## 2019-02-09 ENCOUNTER — Encounter (INDEPENDENT_AMBULATORY_CARE_PROVIDER_SITE_OTHER): Payer: Self-pay

## 2019-02-20 ENCOUNTER — Encounter (INDEPENDENT_AMBULATORY_CARE_PROVIDER_SITE_OTHER): Payer: Self-pay

## 2019-02-21 ENCOUNTER — Encounter (INDEPENDENT_AMBULATORY_CARE_PROVIDER_SITE_OTHER): Payer: Self-pay | Admitting: Cardiology

## 2019-02-21 ENCOUNTER — Telehealth (INDEPENDENT_AMBULATORY_CARE_PROVIDER_SITE_OTHER): Payer: Medicare Other | Admitting: Cardiology

## 2019-02-21 VITALS — BP 138/90 | HR 75 | Temp 97.3°F | Ht 61.0 in | Wt 136.2 lb

## 2019-02-21 DIAGNOSIS — G459 Transient cerebral ischemic attack, unspecified: Secondary | ICD-10-CM

## 2019-02-21 DIAGNOSIS — I10 Essential (primary) hypertension: Secondary | ICD-10-CM

## 2019-02-21 NOTE — Progress Notes (Signed)
Verbal consent has been obtained from the patient to conduct a video and telephone visit to minimize exposure to COVID-19: yes    Amber [name] 68y.o. with a history of HTN presents for cardiac follow up    Since her gastric surgery, she has been intolerant to medications due to GI distress.       Hypertension: Norvasc was discontinued since last visit.  She was started on atenolol 25 mg daily with improvement in her palpitations.    Palpitations: Have significantly decreased with the addition of atenolol 25 mg daily.  She had a recent 24-hour Holter monitor that did not show any significant events.  Last year she did wear a 30-day event monitor that noted occasional PVCs.    Cerebrovascular disease: History of a TIA.  She has had no recurrence.  This TIA occurred when she was not on aspirin.  Currently compliant with aspirin.    Cardiac work up has included  - stress test (11/06/17 - normal myocardial perfusion)  - cardiac cath ( 01/08/14 - normal )  - echocardiogram ( EF 55%, mild TR)  - 24 hour holter - no significant events    Patient denies any exertional chest pain, dyspnea,  syncope, orthopnea, edema or paroxysmal nocturnal dyspnea.          Past Medical History:   Diagnosis Date   . Asthma    . Bronchitis 01/02/2019   . Chest pain 2016     Chest pain after eating x6  months--being followed by GI for GERD   . Claustrophobia     MRIs   . Constipation    . Genital herpes     no current outbreak (10/03/15)   . GERD (gastroesophageal reflux disease)    . Hiatal hernia     h/o surgery   . Hyperlipidemia    . Hypertensive disorder     Well controlled on meds per pt. Elevated with pain.   . Low back pain    . Nausea without vomiting    . OSA on CPAP 2015    CPAP nightly x 1 yr, used nightly.   . Osteoarthritis of knees, bilateral     and back   . SBO (small bowel obstruction) 09/2018   . TIA (transient ischemic attack) 2014    TIA 2014-no residual. Patient  evaulated for possible TIA 07/12/2015  @ [facility] (dischage summary in epic)-per summary CT of head was normal and patient was d/c'd     Family History   Problem Relation Age of Onset   . Cancer Mother 65        breast cancer   . Breast cancer Mother    . Heart disease Father    . Malignant hyperthermia Neg Hx    . Pseudochol deficiency Neg Hx      Current Outpatient Medications   Medication Sig Dispense Refill   . acetaminophen (TYLENOL) 325 MG tablet Take 650 mg by mouth every 4 (four) hours as needed for Pain     . alendronate (FOSAMAX) 70 MG tablet Take 1 tablet (70 mg total) by mouth once a week Take 1 tab by mouth every 7 days with a full glass of water on an empty stomach. Do not take anything else by mouth or lie down for 30 mins. 12 tablet 1   . aspirin EC 81 MG EC tablet Take 1 tablet (81 mg total) by mouth daily. 30 tablet 1   . atenolol (TENORMIN) 25 MG tablet  Take 1 tablet (25 mg total) by mouth daily (Patient taking differently: Take 25 mg by mouth every morning   ) 90 tablet 1   . fluticasone (FLONASE) 50 MCG/ACT nasal spray 2 sprays by Nasal route daily. (Patient taking differently: 2 sprays by Nasal route as needed.   ) 16 g 2   . metoclopramide (REGLAN) 5 MG tablet Take 1 tablet (5 mg total) by mouth 4 (four) times daily as needed (nausea or abd pain) 10 tablet 0   . Multiple Vitamin (MULTIVITAMIN) capsule Take 1 capsule by mouth daily     . nystatin (NYSTOP) powder Apply to affected area 3 times daily 60 g 1   . ondansetron (ZOFRAN-ODT) 4 MG disintegrating tablet Take 4 mg by mouth every 8 (eight) hours as needed for Nausea     . pantoprazole (PROTONIX) 40 MG tablet TAKE 1 TABLET(40 MG) BY MOUTH DAILY (Patient taking differently: Take 40 mg by mouth every morning   ) 90 tablet 1   . valACYclovir HCL (VALTREX) 500 MG tablet TAKE 1 TABLET(500 MG) BY MOUTH DAILY (Patient taking differently: Take 500 mg by mouth as needed TAKE 1 TABLET(500 MG) BY MOUTH DAILY  ) 90 tablet 1     No current  facility-administered medications for this visit.             PE:    Vitals:    02/21/19 0946   BP: 138/90   Pulse: 75   Temp: 97.3 F (36.3 C)     Body mass index is 25.73 kg/m.    covid exam        Labs:  Lipid Panel   Cholesterol   Date/Time Value Ref Range Status   08/16/2018 07:34 AM 187 <200 mg/dL Final     Triglycerides   Date/Time Value Ref Range Status   08/16/2018 07:34 AM 61 <150 mg/dL Final   16/08/9603 54:09 AM 26 (L) 34 - 149 mg/dL Final     HDL   Date/Time Value Ref Range Status   08/16/2018 07:34 AM 80 >50 mg/dL Final       CMP:   Sodium   Date/Time Value Ref Range Status   09/02/2018 04:05 AM 141 136 - 145 mEq/L Final     Potassium   Date/Time Value Ref Range Status   09/02/2018 04:05 AM 4.3 3.5 - 5.1 mEq/L Final     Chloride   Date/Time Value Ref Range Status   09/02/2018 04:05 AM 110 100 - 111 mEq/L Final   08/16/2018 07:34 AM 107 98 - 110 mmol/L Final     CO2   Date/Time Value Ref Range Status   09/02/2018 04:05 AM 21 (L) 22 - 29 mEq/L Final     Glucose   Date/Time Value Ref Range Status   09/02/2018 04:05 AM 77 70 - 100 mg/dL Final     Comment:     ADA guidelines for diabetes mellitus:  Fasting:  Equal to or greater than 126 mg/dL  Random:   Equal to or greater than 200 mg/dL       BUN   Date/Time Value Ref Range Status   09/02/2018 04:05 AM 5 (L) 7 - 19 mg/dL Final     Protein, Total   Date/Time Value Ref Range Status   08/31/2018 05:46 PM 7.6 6.0 - 8.3 g/dL Final   81/19/1478 29:56 AM 6.5 6.1 - 8.1 g/dL Final     Alkaline Phosphatase   Date/Time Value Ref Range Status  08/31/2018 05:46 PM 103 37 - 106 U/L Final     AST (SGOT)   Date/Time Value Ref Range Status   08/31/2018 05:46 PM 20 5 - 34 U/L Final     ALT   Date/Time Value Ref Range Status   08/31/2018 05:46 PM 13 0 - 55 U/L Final     Anion Gap   Date/Time Value Ref Range Status   09/02/2018 04:05 AM 10.0 5.0 - 15.0 Final       CBC:   WBC   Date/Time Value Ref Range Status   09/02/2018 04:05 AM 5.70 3.10 - 9.50 x10 3/uL Final    06/30/2009 04:03 PM 9.12 3.50 - 10.80 /CUMM Final     RBC   Date/Time Value Ref Range Status   09/02/2018 04:05 AM 3.74 (L) 3.90 - 5.10 x10 6/uL Final     Hemoglobin   Date/Time Value Ref Range Status   08/16/2018 07:34 AM 12.3 11.7 - 15.5 g/dL Final     Hgb   Date/Time Value Ref Range Status   09/02/2018 04:05 AM 10.4 (L) 11.4 - 14.8 g/dL Final     Hematocrit   Date/Time Value Ref Range Status   09/02/2018 04:05 AM 32.4 (L) 34.7 - 43.7 % Final     MCV   Date/Time Value Ref Range Status   09/02/2018 04:05 AM 86.6 78.0 - 96.0 fL Final     MCHC   Date/Time Value Ref Range Status   09/02/2018 04:05 AM 32.1 31.5 - 35.8 g/dL Final     RDW   Date/Time Value Ref Range Status   09/02/2018 04:05 AM 14 11 - 15 % Final     Platelets   Date/Time Value Ref Range Status   09/02/2018 04:05 AM 222 142 - 346 x10 3/uL Final   08/16/2018 07:34 AM 252 140 - 400 Thousand/uL Final           Impression / plan    Hypertension: Blood pressure well controlled.  Continue atenolol 25 mg daily.    Palpitations: Have decreased in frequency.  Continue atenolol 25 mg daily.  No malignant arrhythmias noted on recent 24-hour Holter monitor and 30-day event monitor.    Cerebrovascular disease: Continue aspirin and statin.      Jaquel Coomer Dan Humphreys  02/21/2019

## 2019-03-10 ENCOUNTER — Encounter (INDEPENDENT_AMBULATORY_CARE_PROVIDER_SITE_OTHER): Payer: Self-pay

## 2019-03-13 ENCOUNTER — Telehealth (HOSPITAL_BASED_OUTPATIENT_CLINIC_OR_DEPARTMENT_OTHER): Payer: Self-pay | Admitting: Gastroenterology

## 2019-03-13 ENCOUNTER — Encounter (INDEPENDENT_AMBULATORY_CARE_PROVIDER_SITE_OTHER): Payer: Self-pay

## 2019-03-13 NOTE — Telephone Encounter (Signed)
RETURN CALL: Voicemail - Detailed Message      SUBJECT:  Appointment Request     REASON FOR VISIT: yearly follow up, endoscopy   PREFERRED DATE/TIME: Mondays  ADDITIONAL INFORMATION: Patient received a letter in the mail to schedule.

## 2019-03-14 NOTE — Telephone Encounter (Signed)
Physician Procedure Request    Procedure:  EUS  Requested Duration: 30 minutes.  When to be scheduled next available opening.  To be scheduled with: Betti Cruz, MD    Location: [place].  Indications: panc cyst     Procedure Details:  N/A     Special Equipment:  N/A    Bowel Prep Preference: N/A.

## 2019-03-29 ENCOUNTER — Encounter (INDEPENDENT_AMBULATORY_CARE_PROVIDER_SITE_OTHER): Payer: Self-pay | Admitting: Internal Medicine

## 2019-03-29 ENCOUNTER — Ambulatory Visit (INDEPENDENT_AMBULATORY_CARE_PROVIDER_SITE_OTHER): Payer: Medicare Other | Admitting: Internal Medicine

## 2019-03-29 VITALS — BP 179/105 | HR 69 | Ht 61.0 in | Wt 142.2 lb

## 2019-03-29 DIAGNOSIS — R42 Dizziness and giddiness: Secondary | ICD-10-CM

## 2019-03-29 DIAGNOSIS — I10 Essential (primary) hypertension: Secondary | ICD-10-CM

## 2019-03-29 DIAGNOSIS — Z1159 Encounter for screening for other viral diseases: Secondary | ICD-10-CM

## 2019-03-29 LAB — CBC AND DIFFERENTIAL
Absolute NRBC: 0 10*3/uL (ref 0.00–0.00)
Basophils Absolute Automated: 0.03 10*3/uL (ref 0.00–0.08)
Basophils Automated: 0.6 %
Eosinophils Absolute Automated: 0.06 10*3/uL (ref 0.00–0.44)
Eosinophils Automated: 1.2 %
Hematocrit: 40.5 % (ref 34.7–43.7)
Hgb: 12.2 g/dL (ref 11.4–14.8)
Immature Granulocytes Absolute: 0.01 10*3/uL (ref 0.00–0.07)
Immature Granulocytes: 0.2 %
Lymphocytes Absolute Automated: 1.03 10*3/uL (ref 0.42–3.22)
Lymphocytes Automated: 20.2 %
MCH: 27.1 pg (ref 25.1–33.5)
MCHC: 30.1 g/dL — ABNORMAL LOW (ref 31.5–35.8)
MCV: 89.8 fL (ref 78.0–96.0)
MPV: 10.8 fL (ref 8.9–12.5)
Monocytes Absolute Automated: 0.53 10*3/uL (ref 0.21–0.85)
Monocytes: 10.4 %
Neutrophils Absolute: 3.45 10*3/uL (ref 1.10–6.33)
Neutrophils: 67.4 %
Nucleated RBC: 0 /100 WBC (ref 0.0–0.0)
Platelets: 258 10*3/uL (ref 142–346)
RBC: 4.51 10*6/uL (ref 3.90–5.10)
RDW: 16 % — ABNORMAL HIGH (ref 11–15)
WBC: 5.11 10*3/uL (ref 3.10–9.50)

## 2019-03-29 LAB — BASIC METABOLIC PANEL
BUN: 13 mg/dL (ref 7.0–19.0)
CO2: 27 mEq/L (ref 21–29)
Calcium: 9.2 mg/dL (ref 8.5–10.5)
Chloride: 106 mEq/L (ref 100–111)
Creatinine: 0.7 mg/dL (ref 0.4–1.5)
Glucose: 88 mg/dL (ref 70–100)
Potassium: 4.3 mEq/L (ref 3.5–5.1)
Sodium: 142 mEq/L (ref 136–145)

## 2019-03-29 LAB — HEMOLYSIS INDEX: Hemolysis Index: 4 (ref 0–18)

## 2019-03-29 LAB — GFR: EGFR: >60

## 2019-03-29 LAB — HEPATITIS C ANTIBODY: Hepatitis C, AB: NONREACTIVE

## 2019-03-29 MED ORDER — MECLIZINE HCL 25 MG PO TABS
25.0000 mg | ORAL_TABLET | Freq: Three times a day (TID) | ORAL | 2 refills | Status: DC | PRN
Start: 2019-03-29 — End: 2019-05-22

## 2019-03-29 MED ORDER — ATENOLOL 25 MG PO TABS
50.0000 mg | ORAL_TABLET | Freq: Every day | ORAL | 1 refills | Status: DC
Start: 2019-03-29 — End: 2019-04-18

## 2019-03-29 NOTE — Progress Notes (Signed)
Subjective:       Patient ID: Amber [name] is a 68 y.o. female.    HPI  Pt c/o    HTN, elevated BP last several days,  +stress,   +dizziness,   No syncope, no falls.   Stays hydrated    Had diarrhea yesterday, some nausea, no vomiting,   No fever, slight h/a    No sick contact,   No otc.   No change in diet,     No dysuria,   Weight up few lbs,   Appetite fair.       The following portions of the patient's history were reviewed and updated as appropriate: allergies, current medications, past family history, past medical history, past social history, past surgical history and problem list.    Review of Systems   Constitutional: Negative for appetite change and unexpected weight change.   Eyes: Negative for visual disturbance.   Respiratory: Negative for shortness of breath.    Cardiovascular: Negative for chest pain and palpitations.   Gastrointestinal: Negative for abdominal pain, nausea and vomiting.   Genitourinary: Negative for dysuria.   Musculoskeletal: Negative for myalgias.   Skin: Negative for rash.   Neurological: Positive for dizziness. Negative for syncope, weakness, light-headedness, numbness and headaches.   Hematological: Negative for adenopathy.   All other systems reviewed and are negative.    BP (!) 179/105   Pulse 69   Ht 1.549 m (5\' 1" )   Wt 64.5 kg (142 lb 3.2 oz)   BMI 26.87 kg/m        Objective:    Physical Exam  Constitutional:       General: She is not in acute distress.     Appearance: She is well-developed. She is obese.   HENT:      Mouth/Throat:      Pharynx: No oropharyngeal exudate.   Eyes:      General: No scleral icterus.     Conjunctiva/sclera: Conjunctivae normal.   Neck:      Musculoskeletal: Neck supple.      Thyroid: No thyromegaly.      Vascular: No carotid bruit or JVD.   Cardiovascular:      Rate and Rhythm: Normal rate and regular rhythm.      Heart sounds: Normal heart sounds. No murmur. No friction rub. No gallop.    Pulmonary:      Effort: Pulmonary effort is  normal. No respiratory distress.      Breath sounds: Normal breath sounds. No rales.   Abdominal:      General: Bowel sounds are normal. There is no distension.      Palpations: Abdomen is soft.      Tenderness: There is no abdominal tenderness. There is no guarding or rebound.      Hernia: No hernia is present.   Musculoskeletal:         General: No swelling, tenderness, deformity or signs of injury.      Right lower leg: No edema.      Left lower leg: No edema.   Neurological:      General: No focal deficit present.      Mental Status: She is alert.             Assessment:       1. Essential hypertension  atenolol (TENORMIN) 25 MG tablet    CBC and differential    Basic Metabolic Panel   2. Need for hepatitis C screening test  Hepatitis C (  HCV) antibody, Total   3. Vertigo  meclizine (ANTIVERT) 25 MG tablet   4. Dizziness            Plan:      Procedures  Orders Placed This Encounter   Procedures   . CBC and differential     Order Specific Question:   Has the patient fasted?     Answer:   Yes   . Basic Metabolic Panel     Order Specific Question:   Has the patient fasted?     Answer:   Yes   . Hepatitis C (HCV) antibody, Total     Order Specific Question:   Has the patient fasted?     Answer:   Yes   . Hemolysis index     Has the patient fasted?->Yes   . GFR     Has the patient fasted?->Yes     Medications Ordered This Encounter       Disp Refills Start End    atenolol (TENORMIN) 25 MG tablet 180 tablet 1 03/29/2019     Take 2 tablets (50 mg total) by mouth daily - Oral    meclizine (ANTIVERT) 25 MG tablet 30 tablet 2 03/29/2019 03/28/2020    Take 1 tablet (25 mg total) by mouth 3 (three) times daily as needed for Dizziness or Nausea - Oral        Increase atenolol to 50mg  daily  Continue meds  rx vertigo prn  Monitor home BP  Keep hydrated, regular diet.  F/u 2 wks

## 2019-03-30 ENCOUNTER — Telehealth (HOSPITAL_BASED_OUTPATIENT_CLINIC_OR_DEPARTMENT_OTHER): Payer: Self-pay

## 2019-03-30 NOTE — Progress Notes (Signed)
Blood tests are normal    Rec:  Continue meds. Keep hydrated. Keep follow up visit

## 2019-03-30 NOTE — Telephone Encounter (Signed)
GI Health Assessment Questions      HT: 5'4  WT: 160 LBS  BMI: 27.5    Patient Assessment      Have you had a previous Endoscopy? Yes  If yes:  Where? [place]  When? 2019  Procedure Type? EUS    Are you taking any Anti-coagulant or Anti-Platelet Medications? No  If yes, which medication are you taking:   Prescribing provider:   Inform patient to call the doctor who prescribed these medicines and ask for special instructions for your blood-thinning medicines     Do you have any bleeding or coagulation disorders? No  If yes, what kind of disorder?    If yes, OK to schedule and route TE to Charge Nurse Pool for further evaluation    Are you Diabetic?  No  Inform patient to call the doctor who prescribed these medicines and ask for special instructions for your diabetes medications     Have you been diagnosed sleep apnea? No  If yes, do you use a sleep apnea machine and what kind?   Instruct patient to bring their machine with them      Do you have a Pacemaker or Defibrillator? No  If yes, what kind of device do you have?   If yes, name of Cardiologist?    Internal [place] Cardiologist, route TE to "p Rush" and Charge Nurse Pool for further review   External Cardiologist, fax device form to patient's provider and route TE to Charge Nurse Pool for further review    Are you on dialysis? No  If yes to any kind of Dialysis, OK to schedule and route TE to Charge Nurse Pool for further review    Do you have End Stage Liver Disease? No  If yes, OK to schedule and route TE to [employer] for further review    Are you a difficult IV start? No  If yes, arranged for 88min early arrival time?   Do you require an ultrasound machine to assist with IV start?     Is the patient scheduled for an ERCP?   If yes, do you have an allergy to contrast?   If yes, routed TE to Interventional Pool for further evaluation?     Is the patient scheduled for an ALS PEG?    If yes, are you able to remove your CPAP or BiPAP  mask on your own?  If no, arranged for an ICU bed post procedure?     PROCEDURE        Procedure MD: Tye Savoy  Procedure Type: EUS  Procedure Date:       Time:    Procedure Check-In Time:     Patient Teaching    Was the topic of blood thinners taught?  Yes     Were instructions for diabetes medication taught? Yes     Reminder that family member or friend must escort patient? Yes     What is the name of their driver?     How were the procedure preparation instruction materials delivered?  Verbal:   On Paper:   eCare message:   Email:      Does this procedure require bowel prep?   If yes, were instructions for the ordered laxative taught?   If yes, which RX was prescribed?      General Notes:

## 2019-03-31 ENCOUNTER — Encounter (INDEPENDENT_AMBULATORY_CARE_PROVIDER_SITE_OTHER): Payer: Self-pay

## 2019-04-07 ENCOUNTER — Emergency Department
Admission: EM | Admit: 2019-04-07 | Discharge: 2019-04-07 | Disposition: A | Discharge status: Home / Self Care | Payer: Medicare Other | Attending: Emergency Medicine | Admitting: Emergency Medicine

## 2019-04-07 ENCOUNTER — Emergency Department: Payer: Medicare Other

## 2019-04-07 DIAGNOSIS — M79661 Pain in right lower leg: Secondary | ICD-10-CM | POA: Insufficient documentation

## 2019-04-07 DIAGNOSIS — Z8673 Personal history of transient ischemic attack (TIA), and cerebral infarction without residual deficits: Secondary | ICD-10-CM | POA: Insufficient documentation

## 2019-04-07 DIAGNOSIS — I10 Essential (primary) hypertension: Secondary | ICD-10-CM | POA: Insufficient documentation

## 2019-04-07 DIAGNOSIS — E785 Hyperlipidemia, unspecified: Secondary | ICD-10-CM | POA: Insufficient documentation

## 2019-04-07 DIAGNOSIS — M25561 Pain in right knee: Secondary | ICD-10-CM | POA: Insufficient documentation

## 2019-04-07 DIAGNOSIS — Z7982 Long term (current) use of aspirin: Secondary | ICD-10-CM | POA: Insufficient documentation

## 2019-04-07 DIAGNOSIS — M170 Bilateral primary osteoarthritis of knee: Secondary | ICD-10-CM | POA: Insufficient documentation

## 2019-04-07 MED ORDER — TRAMADOL HCL 50 MG PO TABS
50.0000 mg | ORAL_TABLET | Freq: Three times a day (TID) | ORAL | 0 refills | Status: AC | PRN
Start: 2019-04-07 — End: 2019-04-14

## 2019-04-07 MED ORDER — ACETAMINOPHEN 325 MG PO TABS
650.0000 mg | ORAL_TABLET | Freq: Once | ORAL | Status: AC
Start: 2019-04-07 — End: 2019-04-07
  Administered 2019-04-07: 08:00:00 650 mg via ORAL
  Filled 2019-04-07: qty 2

## 2019-04-07 NOTE — Discharge Instructions (Signed)
Joint Pain    You have been seen for joint pain.    Many things can cause pain in your joints. Having joint pain means that you often have inflammation in your joints.     Some things that can cause joint pain are:   Rheumatoid arthritis (inflammation of the joints).   Trauma or injury to your joints.   A viral infection.   Overuse of your joints.   Obesity that causes excess wear and tear on your joints.   Gout (swelling of the joints).   Osteoarthritis (breakdown of cartilage in the joints).   Inflammation of a joint tendon (tendinitis).    It is not yet known why you are having joint pains. You might need another exam or more tests to find out why you have these symptoms. At this time, the cause of your symptoms does not seem dangerous. You don't need to stay in the hospital.    Some things you can try at home are:   Rest the affected joint.   Frequent stretching.   Massage.   NSAID medications like ibuprofen (Advil or Motrin), naproxen (Aleve, Naprosyn).   Elevating the joints that hurt.    Follow the instructions for any medication you are prescribed.     Follow up with your doctor in 7 days.    We don't believe your condition is dangerous right now. However, you need to be careful. Sometimes a problem that seems small can get serious later. Therefore, it is very important for you to come back here or go to the nearest Emergency Department if you don't get better or your symptoms get worse.    YOU SHOULD SEEK MEDICAL ATTENTION IMMEDIATELY, EITHER HERE OR AT THE NEAREST EMERGENCY DEPARTMENT, IF ANY OF THE FOLLOWING OCCUR:     You have a fever (temperature higher than 100.62F or 38C).   Your pain does not go away or gets worse.   You notice redness over the joint.   Your painful joint gets very swollen.   You don't feel better after treatment.   Any other symptoms, concerns, or not getting better as expected.    If you can't follow up with your doctor, or if at any time you feel  you need to be rechecked or seen again, come back here or go to the nearest emergency department.               Arthralgia    You have been diagnosed with Arthralgia.    Arthralgia means pain and stiffness of the joints. People often describe the pain as aching or throbbing. Arthralgia can affect one or more joints. It can be caused by many types of conditions and/or injuries. Often, arthralgia lasts for a long time and people need treatment over months or years. Some causes of arthralgia are:     Infection with a virus.   Many types of infections that are starting to improve. When recovering from infection, sometimes there is joint pain.    Autoimmune diseases (where the body attacks itself). Examples are Lupus or Rheumatoid Arthritis.   Inflammation of the tendons or the fluid-filled sacs (bursa) surrounding your joints.   Low thyroid function.   Depression.     You might need another exam or more tests to find out why you have arthralgias. At this time, the cause of your symptoms does not seem dangerous. You do not need to stay in the hospital.    We don't believe your condition  is dangerous right now. However, you need to be careful. Sometimes a problem that seems small can get serious later. This is why it is very important to come back here or go to the nearest Emergency Department unless you are much improved.    Clues that joint pain is dangerous are:     Hot and swollen joints. This may mean they are infected.   Fever (Temperature higher than 100.89F or 38C), weight loss and feeling very ill can be symptoms of severe infection (sepsis).   Severe pain, weakness or numbness (loss of feeling).    Some things you can try at home are:     Over-the-counter pain medications.   Heating pads and warm baths.   Physical therapy.    Follow the instructions for any medication you get prescribed.     Have a close follow-up with your primary care doctor.    YOU SHOULD SEEK MEDICAL ATTENTION  IMMEDIATELY, EITHER HERE OR AT THE NEAREST EMERGENCY DEPARTMENT, IF ANY OF THE FOLLOWING OCCURS:     You have a fever (temperature higher than 100.89F or 38C).   Your pain does not go away or gets worse.   The joint that hurts turns red and/or gets swollen.   You suddenly can't walk.   You don't feel better after treatment or feel you're getting worse.   You get any other symptoms, concerns, or don't get better as expected.    If you can't follow up with your doctor, or if at any time you feel you need to be rechecked or seen again, come back here or go to the nearest emergency department.               Leg Pain    You have been diagnosed with leg pain.    Many things can cause leg pain. Most of the causes are not dangerous and will get better on their own. These can include muscle cramps, bruises, strains, pinched nerves, and minor skin infections.    You had an exam by your doctor today. He or she decided that the cause of your leg pain does not seem to be serious or dangerous.    During your visit, you had an ultrasound (duplex doppler) to make sure that you didn't have a blood clot in your leg.    If your pain continues, you might need another exam or more tests. This is to find out why you have leg pain. The cause of your symptoms doesn't seem dangerous now. You don't need to stay in the hospital.    Though we don't believe your condition is dangerous right now, it is important to be careful. Sometimes a problem that seems mild can become serious later. This is why it is very important that you return here or go to the nearest Emergency Department if you are not improving or your symptoms are getting worse.    Some things you may try at home are:    Over-the-counter pain medications like that have ibuprofen (Advil/Motrin) or acetaminophen (Tylenol) in them. Follow the directions on the package.   Rest the leg as needed. As soon as you are able, start moving your leg again to keep it from  getting stiff.    Return here or go to the nearest Emergency Department or follow-up with your doctor if you are not getting better as expected.    Follow the instructions for any medication you are prescribed.     YOU SHOULD  SEEK MEDICAL ATTENTION IMMEDIATELY, EITHER HERE OR AT THE NEAREST EMERGENCY DEPARTMENT, IF ANY OF THE FOLLOWING HAPPENS:   You have a fever (temperature higher than 100.38F or 38C).   Your pain does not go away or gets worse.   Your leg or joints (hip, knee, ankle, etc.) are red or swollen.   Your chest hurts or you get short of breath.   You have any other symptoms or concerns, or don't get better as expected.    If you can't follow up with your doctor, or if at any time you feel you need to be rechecked or seen again, come back here or go to the nearest emergency department.               Knee Pain NOS    You have been seen for knee pain.    There are a few causes for knee pain. The doctor feels your knee pain is not from an injury to your knee's bones or ligaments.     Injury to the ligaments or bones is not the only cause of knee pain. There are other causes. These include:   Tendonitis. This is the inflammation (swelling) of the tendons. Tendons are the thick cords that connect the muscles around the knee to the bones of the knee joint.   Bursitis. This is the inflammation (swelling) of the fluid-filled sacs that cushion the knee joint.   Arthritis (inflammation of joints).   Gout (swelling of the joints).   Knee injuries from overuse.    Some things you can do to treat your knee pain are:   Apply ice to the knee with an ice pack. Be sure to put a towel between the ice pack and your skin. NEVER PLACE DIRECTLY ON YOUR SKIN. You can do this for 15 minutes at a time, several times a day.   Use anti-inflammatory medicine like ibuprofen (Advil or Motrin) to help the pain and swelling.   Avoid doing things that put a lot of stress on your knee joints. This includes  running or playing tennis.    You had imaging (x-rays, etc) of the knee. They showed no signs of any broken bones.    YOU SHOULD SEEK MEDICAL ATTENTION IMMEDIATELY, EITHER HERE OR AT THE NEAREST EMERGENCY DEPARTMENT, IF ANY OF THE FOLLOWING OCCUR:   Your knee pain gets worse.   You have fever (temperature higher than 100.38F / 38C) or chills or your knee gets more red or warm.   You have any other problems or concerns.

## 2019-04-07 NOTE — EDIE (Signed)
COLLECTIVE?NOTIFICATION?04/07/2019 06:55?YITTY, ROADS F?MRN: [id number]    Criteria Met      5 ED Visits in 12 Months    Security and Safety  No recent Security Events currently on file    ED Care Guidelines  There are currently no ED Care Guidelines for this patient. Please check your facility's medical records system.            E.D. Visit Count (12 mo.)  Facility Visits   [facility] 3   [place] [facility]: [facility] 6   Total 9   Note: Visits indicate total known visits.      Recent Emergency Department Visit Summary  Date Facility [facility] Type Diagnoses or Chief Complaint   Apr 07, 2019 [place] [facility]: [facility] [place] [facility]      TRIAGE - Pain on Right Knee      Apr 03, 2019 [facility] [name] [facility]      RS/      CHEST PAIN ADULT      Chest pain, unspecified      Jan 02, 2019 [place] [facility]: [facility] [place] [facility]      Cough weakness      Flu like symptoms      Bronchitis, not specified as acute or chronic      Gastro-esophageal reflux disease without esophagitis      Oct 22, 2018 [place] [facility]: [facility] [place] [facility]      Fall, Lower Back Pain      Back Pain      Unspecified fall, initial encounter      Low back pain      Wedge compression fracture of first lumbar vertebra, initial      Aug 31, 2018 [place] [facility]: [facility] [place] [facility]      abdominal pain      Unspecified intestinal obstruction, unspecified as to partial      Jul 26, 2018 [place] [facility]: [facility] [place] [facility]      Pain with swallowing      Cough      Otalgia      Sore Throat      Influenza due to other identified influenza virus with other      Jul 07, 2018 [facility] [place] [name] [facility]      RS ANGINA      CHEST PAIN ADULT      Chest pain, unspecified      Jun 16, 2018 [place] [facility]: [facility] [place]  [facility]      Chest Pain      Chest pain, unspecified      Dizziness and giddiness      Essential (primary) hypertension      Apr 07, 2018 [facility] [place] [name] [facility]      RS CHEST PAIN      Other chest pain      CHEST PAIN ADULT          Recent Inpatient Visit Summary  Date Facility [facility] Type Diagnoses or Chief Complaint   Sep 01, 2018 [facility] [place] [facility]      Unspecified intestinal obstruction, unspecified as to partial          Care Team  There is not a care team on record at this time.  Collective Portal  This patient has registered at the [facility] Emergency Room: [facility] Emergency Department   For more information visit: [email or web address]     PLEASE NOTE:     1.   Any care recommendations and other clinical information are provided as guidelines or for historical purposes only, and providers should exercise their own clinical judgment when providing care.    2.   You may only use this information for purposes of treatment, payment or health care operations activities, and subject to the limitations of applicable Collective Policies.    3.   You should consult directly with the organization that provided a care guideline or other clinical history with any questions about additional information or accuracy or completeness of information provided.    ? 2020 [place], [employer] - [email or web address]

## 2019-04-07 NOTE — ED Triage Notes (Signed)
C/O right knee pain (10/10) that started last night and worsened this AM following applying ice and extremity elevation. Denies recent injury. Hx of arthritis.

## 2019-04-07 NOTE — ED Provider Notes (Signed)
EMERGENCY DEPARTMENT NOTE       HISTORY OF PRESENT ILLNESS   Historian:Amber [name]  Translator Used: No    Chief Complaint: Knee Pain (Right Knee)     Mechanism of Injury: denies trauma, fall or injury      68 y.o. female with hx of arthritis presents to the ed with right knee pain that started last night. Pt states she applied ice on it but pain was worse this am. Denies trauma, fall or injury.  Pt c/o pain in right calf as well.  Denies numbness, tingling or weakness of extremities.   Pt drove to the ed.       1. Location of symptoms: right knee and right calf  2. Onset of symptoms: last night  3. What was Amber [name] doing when symptoms started (Context): see above  4. Severity: moderate  5. Timing: sudden onset  6. Activities that worsen symptoms: walking and movement   7. Activities that improve symptoms: nothing  8. Quality: aching and sore   9. Radiation of symptoms: distally  10. Associated signs and Symptoms: see above  11. Are symptoms worsening? yes  MEDICAL HISTORY     Past Medical History:  Past Medical History:   Diagnosis Date   . Asthma    . Bronchitis 01/02/2019   . Chest pain 2016     Chest pain after eating x6  months--being followed by GI for GERD   . Claustrophobia     MRIs   . Constipation    . Genital herpes     no current outbreak (10/03/15)   . GERD (gastroesophageal reflux disease)    . Hiatal hernia     h/o surgery   . Hyperlipidemia    . Hypertensive disorder     Well controlled on meds per pt. Elevated with pain.   . Low back pain    . Nausea without vomiting    . OSA on CPAP 2015    CPAP nightly x 1 yr, used nightly.   . Osteoarthritis of knees, bilateral     and back   . SBO (small bowel obstruction) 09/2018   . TIA (transient ischemic attack) 2014    TIA 2014-no residual. Amber [name] evaulated for possible TIA 07/12/2015  @ [facility] (dischage summary in epic)-per summary CT of head was normal and Amber [name] was d/c'd       Past Surgical History:  Past Surgical History:   Procedure Laterality Date   .  APPENDECTOMY  >25 yrs   . CHOLECYSTECTOMY     . EGD  01/2015   . EGD N/A 01/23/2019    Procedure: EGD;  Surgeon: Champ Mungo, MD;  Location: [facility];  Service: General;  Laterality: N/A;  ENDOSCOPY  MD REQ=30MINS; Q1=UNK   . EGD, BIOPSY N/A 09/15/2017    Procedure: EGD, BIOPSY;  Surgeon: Pershing Proud, MD;  Location: [facility];  Service: Gastroenterology;  Laterality: N/A;   . fallopian tube surgery  >25 yrs   . INSERTION, PAIN PUMP (MEDICAL) N/A 10/22/2015    Procedure: INSERTION, PAIN PUMP (MEDICAL);  Surgeon: Nicola Police, DO;  Location: [facility];  Service: General;  Laterality: N/A;   . LAPAROSCOPIC, CHOLECYSTECTOMY, CHOLANGIOGRAM  08/13/2014   . LAPAROSCOPIC, GASTRIC BYPASS N/A 10/22/2015    Procedure: LAPAROSCOPIC, GASTRIC BYPASS;  Surgeon: Josefa Half R, DO;  Location: [facility];  Service: General;  Laterality: N/A;   . LAPAROSCOPIC, HERNIORRHAPHY, HIATAL N/A 10/22/2015    Procedure: LAPAROSCOPIC,  HERNIORRHAPHY, HIATAL;  Surgeon: Nicola Police, DO;  Location: [facility];  Service: General;  Laterality: N/A;   . LAPAROSCOPIC, LYSIS, ADHESIONS N/A 09/01/2018    Procedure: LAPAROSCOPIC, LYSIS, ADHESIONS RELEASE OF  SMALL BOWEL OBSTRUCTION;  Surgeon: Champ Mungo, MD;  Location: [facility];  Service: General;  Laterality: N/A;   . LAPAROSCOPY, DIAGNOSTIC N/A 09/01/2018    Procedure: LAPAROSCOPY, DIAGNOSTIC;  Surgeon: Champ Mungo, MD;  Location: [facility];  Service: General;  Laterality: N/A;   . OVARY SURGERY Right >25 yrs   . SPINE SURGERY  11/2013    cervical HNP x 2, Dr. Bufford Buttner       Social History:  Social History     Socioeconomic History   . Marital status: Single     Spouse name: Not on file   . Number of children: 2   . Years of education: Not on file   . Highest education level: Not on file   Occupational History   . Occupation: Acupuncturist: [employer]    Social Needs   . Financial resource strain:  Not on file   . Food insecurity     Worry: Not on file     Inability: Not on file   . Transportation needs     Medical: Not on file     Non-medical: Not on file   Tobacco Use   . Smoking status: Never Smoker   . Smokeless tobacco: Never Used   Substance and Sexual Activity   . Alcohol use: Never     Frequency: Never   . Drug use: Never   . Sexual activity: Never     Birth control/protection: Post-menopausal   Lifestyle   . Physical activity     Days per week: Not on file     Minutes per session: Not on file   . Stress: Not on file   Relationships   . Social Wellsite geologist on phone: Not on file     Gets together: Not on file     Attends religious service: Not on file     Active member of club or organization: Not on file     Attends meetings of clubs or organizations: Not on file     Relationship status: Not on file   . Intimate partner violence     Fear of current or ex partner: Not on file     Emotionally abused: Not on file     Physically abused: Not on file     Forced sexual activity: Not on file   Other Topics Concern   . Dietary supplements / vitamins Yes   . Anesthesia problems No   . Blood thinners No   . Pregnant Not Asked   . Future Children Not Asked   . Number of Pregnancies? Not Asked   . Number of children Not Asked   . Miscarriages / Abortions? Not Asked   . Eats large amounts No   . Excessive Sweets No   . Skips meals No   . Eats excessive starches Yes   . Snacks or grazes No   . Emotional eater Yes   . Eats fried food Yes   . Eats fast food Yes   . Diet Center No   . HMR No   . [name] No   . [place] Weight Loss No   . Nutri-System No   .  Opti-Fast / Medi-Fast No   . Overeaters Anonymous No   . [facility] Weight Loss Center No   . TOPS No   . Weight Watchers No   . Atkins No   . Binging / Purging No   . Body for Life No   . Cabbage Soup No   . Calorie Counting No   . Fasting No   . [name] No   . Health Spa No   . Herbal Life No   . High Protein No   . Low Carb No   . Low Fat No   .  [facility] Diet No   . [place] Diet No   . Margie Billet Diet No   . [place] Diet No   . Slim Fast No   . [place] No   . Sugar Busters No   . Vomiting No   . Zone Diet No   . Stationary cycle or treadmill No   . Gym/fitness Classes No   . Home exercise/video No   . Swimming No   . Team sports No   . Weight training No   . Walking or running No   . Hospitalization No   . Hypnosis No   . Physical therapy No   . Psychological therapy No   . Residential program No   . Acutrim No   . Amphetamines No   . Anorex No   . Byetta No   . Dexatrim No   . Didrex No   . Fastin No   . Fen - Phen No   . Ionamin / Adipex No   . Mazanor No   . Meridia No   . Obalan No   . Phendiet No   . Phentrol No   . Phentermine Yes   . Plegine No   . Pondimin No   . Qsymia No   . Prozac No   . Redux No   . Sanorex No   . Tenuate No   . Tepanole No   . Wechless No   . Wellbutrin No   . Xenical (Orlistat, Alli) No   . Other Med No   . No impairment Yes     Comment: Chronic Bilat Knee Pain   . Walks with cane/crutch Yes   . Requires a wheelchair No   . Bedridden No   . Are you currently being treated for depression? No   . Do you snore? Yes   . Are you receiving any medical or psychological services? No   . Do you ever wake up at night gasping for breath? Not Asked   . Do you have or have you been treated for an eating disorder? No   . Anyone ever told you that you stop breathing while asleep? Not Asked   . Do you exercise regularly? No   . Have you or family member ever have trouble with anesthesia? No   Social History Narrative   . Not on file       Family History:  Family History   Problem Relation Age of Onset   . Cancer Mother 40        breast cancer   . Breast cancer Mother    . Heart disease Father    . Malignant hyperthermia Neg Hx    . Pseudochol deficiency Neg Hx        Outpatient Medication:  Discharge Medication List as of 04/07/2019  8:33 AM  CONTINUE these medications which have NOT CHANGED    Details   acetaminophen  (TYLENOL) 325 MG tablet Take 650 mg by mouth every 4 (four) hours as needed for Pain, Historical Med      aspirin EC 81 MG EC tablet Take 1 tablet (81 mg total) by mouth daily., Starting Tue 01/25/2018, Normal      atenolol (TENORMIN) 25 MG tablet Take 2 tablets (50 mg total) by mouth daily, Starting Wed 03/29/2019, Normal      fluticasone (FLONASE) 50 MCG/ACT nasal spray 2 sprays by Nasal route daily., Starting 01/02/2014, Until Discontinued, Normal      meclizine (ANTIVERT) 25 MG tablet Take 1 tablet (25 mg total) by mouth 3 (three) times daily as needed for Dizziness or Nausea, Starting Wed 03/29/2019, Until Thu 03/28/2020, Normal      metoclopramide (REGLAN) 5 MG tablet Take 1 tablet (5 mg total) by mouth 4 (four) times daily as needed (nausea or abd pain), Starting Mon 01/02/2019, Normal      Multiple Vitamin (MULTIVITAMIN) capsule Take 1 capsule by mouth daily, Historical Med      nystatin (NYSTOP) powder Apply to affected area 3 times daily, Normal      ondansetron (ZOFRAN-ODT) 4 MG disintegrating tablet Take 4 mg by mouth every 8 (eight) hours as needed for Nausea, Historical Med      pantoprazole (PROTONIX) 40 MG tablet TAKE 1 TABLET(40 MG) BY MOUTH DAILY, Normal      valACYclovir HCL (VALTREX) 500 MG tablet TAKE 1 TABLET(500 MG) BY MOUTH DAILY, Normal               REVIEW OF SYSTEMS   Review of Systems   Respiratory: Negative for shortness of breath.    Cardiovascular: Negative for chest pain.   Musculoskeletal: Positive for joint pain (right knee pain, right calf pain).   Neurological: Negative for tingling, focal weakness and weakness.   All other systems reviewed and are negative.         PHYSICAL EXAM     ED Triage Vitals [04/07/19 0702]   Enc Vitals Group      BP 174/83      Heart Rate 65      Resp Rate 17      Temp 98.4 F (36.9 C)      Temp Source Oral      SpO2 100 %      Weight 64.4 kg      Height 1.549 m      Head Circumference       Peak Flow       Pain Score 10      Pain Loc       Pain Edu?        Excl. in GC?      Nursing note and vitals reviewed.   Constitutional:  No acute distress.  Head:  Atraumatic.  Normocephalic.   Eyes:  Normal sclera.  PERRL.    ENT:  Moist mucosa.     Cardiovascular:  Well perfused.  Equal pulses.  Regular rate.  Normal capillary refill.    Pulmonary/Chest:  No respiratory distress.  Airway patent.  No tachypnea.  No accessory muscle usage.    Abdominal:  Non-distended.    Extremities:  No peripheral edema. Moves all extremities equally. Right Knee:  + tenderness and swelling right knee.  Pain with flexion and extension right knee.  no tenderness right hip. Full extension rle. No ecchymosis, + swelling, no deformity of the  right  knee. No tenderness or swelling right ankle. Full rom right ankle and right foot.  No increased warmth, erythema, induration or fluctuance. DP pulses are 2+.  Normal capillary refill.  Distal sensation is intact.    Skin:  Normal color. No cyanosis or pallor.  Neurological:  Alert, awake, and appropriate.  Normal speech.  Ambulatory with cane to the ed.   Psychological: Normal affect.        MEDICAL DECISION MAKING   Xray to r/o fracture or dislocation  Ace wrap  applied by technician, and checked by me. Ace wrap in good position. NV intact  Pt has own cane to use for support with ambulation    Venous doppler RLE to R/o dVT    xrays right knee shows djd  [place] RLE venous doppler to r/o dvt was negative for dvt    Symptoms consistent with right knee pain due to djd of right knee    Vss. Results discussed with pt. Instructed to wear ace wrap for support.  Instructed to take medications as prescribed.    Will d/c. Pt instructed to follow up with orthopedics.  Instructed to return for worsening symptoms. Pt agrees.    Differential diagnosis includes arthritis flare up right knee, right knee sprain, ligamentous injury right knee, meniscal tear right knee  DISCUSSION        The Amber [name] is NOT septic.  All labs and vital signs from the current visit have been  reviewed and any abnormality that is present is not due to sepsis.    Vital Signs: Reviewed the Amber [name]'s vital signs.   Nursing Notes: Reviewed and utilized available nursing notes.  Medical Records Reviewed: Reviewed available past medical records.  Counseling: The emergency provider has spoken with the Amber [name] and discussed today's findings, in addition to providing specific details for the plan of care.  Questions are answered and there is agreement with the plan.      MIPS DOCUMENTATION        CARDIAC STUDIES    The following cardiac studies were independently interpreted by the Emergency Medicine Physician.  For full cardiac study results please see chart.      EMERGENCY IMAGING STUDIES    The following imagine studies were independently interpreted by me (emergency physician):      Radiology:  Exam interpreted by me (ED Physician)  Exam: right knee   Findings: No fracture. No Dislocation. No foreign body. + DJD right knee  Impression: No acute abnormality.     RADIOLOGY IMAGING STUDIES      US Venous Duplex Doppler Leg Right   Final Result      No evidence of right lower extremity deep venous thrombosis.      Fonnie Mu, MD    04/07/2019 8:23 AM      Knee Right 4+ Views   Final Result          There is no evidence for fracture or dislocation of the right knee.   There is suggestion of a minimal knee effusion. There are   tricompartmental degenerative changes of the knee.      Sandie Ano, MD    04/07/2019 8:06 AM              PULSE OXIMETRY    Oxygen Saturation by Pulse Oximetry: 100%  Interventions: none  Interpretation:  nml    EMERGENCY DEPT. MEDICATIONS      ED Medication Orders (From admission, onward)    Start Ordered  Status Ordering Provider    04/07/19 [phone] 04/07/19 0708  acetaminophen (TYLENOL) tablet 650 mg  Once     Route: Oral  Ordered Dose: 650 mg     Last MAR action:  Given Lashelle Koy G          LABORATORY RESULTS    Ordered and independently interpreted AVAILABLE laboratory tests.  Please see results section in chart for full details.      CRITICAL CARE/PROCEDURES    Procedures    DIAGNOSIS      Diagnosis:  Final diagnoses:   Acute pain of right knee   Right calf pain       Disposition:  ED Disposition     ED Disposition Condition Date/Time Comment    Discharge  Fri Apr 07, 2019  8:33 AM [name] discharge to home/self care.    Condition at disposition: Stable          Prescriptions:  Discharge Medication List as of 04/07/2019  8:33 AM      START taking these medications    Details   traMADol (ULTRAM) 50 MG tablet Take 1 tablet (50 mg total) by mouth every 8 (eight) hours as needed for Pain Do not drive or operate machinery while taking this medication, Starting Fri 04/07/2019, Until Fri 04/14/2019, Normal         CONTINUE these medications which have NOT CHANGED    Details   acetaminophen (TYLENOL) 325 MG tablet Take 650 mg by mouth every 4 (four) hours as needed for Pain, Historical Med      aspirin EC 81 MG EC tablet Take 1 tablet (81 mg total) by mouth daily., Starting Tue 01/25/2018, Normal      atenolol (TENORMIN) 25 MG tablet Take 2 tablets (50 mg total) by mouth daily, Starting Wed 03/29/2019, Normal      fluticasone (FLONASE) 50 MCG/ACT nasal spray 2 sprays by Nasal route daily., Starting 01/02/2014, Until Discontinued, Normal      meclizine (ANTIVERT) 25 MG tablet Take 1 tablet (25 mg total) by mouth 3 (three) times daily as needed for Dizziness or Nausea, Starting Wed 03/29/2019, Until Thu 03/28/2020, Normal      metoclopramide (REGLAN) 5 MG tablet Take 1 tablet (5 mg total) by mouth 4 (four) times daily as needed (nausea or abd pain), Starting Mon 01/02/2019, Normal      Multiple Vitamin (MULTIVITAMIN) capsule Take 1 capsule by mouth daily, Historical Med      nystatin (NYSTOP) powder Apply to affected area 3 times daily, Normal      ondansetron (ZOFRAN-ODT) 4 MG disintegrating tablet Take 4 mg by mouth every 8 (eight) hours as needed for Nausea, Historical Med      pantoprazole  (PROTONIX) 40 MG tablet TAKE 1 TABLET(40 MG) BY MOUTH DAILY, Normal      valACYclovir HCL (VALTREX) 500 MG tablet TAKE 1 TABLET(500 MG) BY MOUTH DAILY, Normal              Loetta Rough, DO  04/07/19 1051

## 2019-04-10 ENCOUNTER — Encounter (INDEPENDENT_AMBULATORY_CARE_PROVIDER_SITE_OTHER): Payer: Self-pay

## 2019-04-10 ENCOUNTER — Telehealth (INDEPENDENT_AMBULATORY_CARE_PROVIDER_SITE_OTHER): Payer: Self-pay

## 2019-04-10 NOTE — Telephone Encounter (Signed)
ED Visit Template    Chart Review:   Facility:[facility]   Discharge Date:04/07/2019  Primary Discharge Dx: Right Knee Pain   Prior ER Visits/Hospitalizations (past year): Yes   Follow Up Appt with PCP/Specialist: PCP 04/12/2019@1115am    Home Care/Home Health Ordered?None   Have you been contacted to arrange care?N/A    Patient Interview:  ? "I heard you went to the ER for pain in your leg, what happened?"I got up Friday morning and I couldn't put weight on my right leg. It hurt badly and I drove to the [facility] to get it checked out."   o What symptoms do you have now, if any? It still hurts but Im scheduled to see my Orthopedic doctor this Friday.    ? Tell me about  your current medications and any changes the hospital made: N/A  o If new medication prescribed: N/A  - Tell me about the new medication and how you are to take it? N/A  - What pharmacy did you use for your new medication(s)?N/A  - What difficulty did you have, if any, picking up your medication(s)?N/A  o If the new medication is an injection: N/A  - What training did you receive from the hospital staff? N/A  - Tell me about any difficulty you are having administering the medication?N/A  o What appointments have you made for follow-up? PCP 04/12/2019@1115am ; Orthopedic doctor on Friday, 04/14/2019@0915am .  o How can I help you to schedule these appointments? None   ? Patient may need one of the following after an ED visit:  o Patient may need to schedule a follow up with PCP  o Patient may need to see a specialist, offer to assist patient in scheduling  o Patient may have used ED inappropriately (i.e. for a non-emergency medical problem), and this opportunity should be utilized to educate patient about the appropriate place for care.  o Link to [facility] Urgent Care Locations:  - [email or web address]    "Thank you for taking my call today! If there is anything that you need, please give your office a call at  [facility] [phone]."     *Instructed patient to call back if symptoms change or worsen and/or go to the emergency room or call 911 for emergency symptoms*     Nadyne Coombes, BSN, RN, [facility]   [address]  [place], [address]    T [phone]

## 2019-04-11 ENCOUNTER — Encounter (INDEPENDENT_AMBULATORY_CARE_PROVIDER_SITE_OTHER): Payer: Self-pay

## 2019-04-12 ENCOUNTER — Ambulatory Visit (INDEPENDENT_AMBULATORY_CARE_PROVIDER_SITE_OTHER): Payer: Medicare Other | Admitting: Internal Medicine

## 2019-04-13 ENCOUNTER — Ambulatory Visit (INDEPENDENT_AMBULATORY_CARE_PROVIDER_SITE_OTHER): Payer: Commercial Managed Care - PPO | Admitting: Cardiology

## 2019-04-14 ENCOUNTER — Encounter (INDEPENDENT_AMBULATORY_CARE_PROVIDER_SITE_OTHER): Payer: Self-pay | Admitting: Internal Medicine

## 2019-04-18 ENCOUNTER — Encounter (INDEPENDENT_AMBULATORY_CARE_PROVIDER_SITE_OTHER): Payer: Self-pay

## 2019-04-18 ENCOUNTER — Encounter (INDEPENDENT_AMBULATORY_CARE_PROVIDER_SITE_OTHER): Payer: Self-pay | Admitting: Cardiology

## 2019-04-18 ENCOUNTER — Ambulatory Visit (INDEPENDENT_AMBULATORY_CARE_PROVIDER_SITE_OTHER): Payer: Medicare Other | Admitting: Cardiology

## 2019-04-18 VITALS — BP 175/78 | HR 60 | Temp 96.1°F | Wt 140.0 lb

## 2019-04-18 DIAGNOSIS — I10 Essential (primary) hypertension: Secondary | ICD-10-CM

## 2019-04-18 DIAGNOSIS — G459 Transient cerebral ischemic attack, unspecified: Secondary | ICD-10-CM

## 2019-04-18 MED ORDER — ATENOLOL 25 MG PO TABS
25.0000 mg | ORAL_TABLET | Freq: Two times a day (BID) | ORAL | 0 refills | Status: DC
Start: 2019-04-18 — End: 2019-07-24

## 2019-04-18 NOTE — Progress Notes (Signed)
Amber [name] [phone]. with a history of HTN presents for cardiac follow up    Since her gastric surgery, she has been intolerant to medications due to GI distress.      Hypertension: Review of home blood pressure readings notes well-controlled blood pressure on atenolol 25 mg twice a day.    Palpitations: Have significantly decreased with the addition of atenolol 25 mg twice a day.  She had a recent 24-hour Holter monitor that did not show any significant events.  Last year she did wear a 30-day event monitor that noted occasional PVCs.    Cerebrovascular disease: History of a TIA.  She has had no recurrence.  This TIA occurred when she was not on aspirin.  Currently compliant with aspirin.    Cardiac work up has included  - stress test (11/06/17 - normal myocardial perfusion)  - cardiac cath ( 01/08/14 - normal )  - echocardiogram ( EF 55%, mild TR)  - 24 hour holter - no significant events    Patient denies any exertional chest pain, dyspnea,  syncope, orthopnea, edema or paroxysmal nocturnal dyspnea.      Past Medical History:   Diagnosis Date   . Asthma    . Bronchitis 01/02/2019   . Chest pain 2016     Chest pain after eating x6  months--being followed by GI for GERD   . Claustrophobia     MRIs   . Constipation    . Genital herpes     no current outbreak (10/03/15)   . GERD (gastroesophageal reflux disease)    . Hiatal hernia     h/o surgery   . Hyperlipidemia    . Hypertensive disorder     Well controlled on meds per pt. Elevated with pain.   . Low back pain    . Nausea without vomiting    . OSA on CPAP 2015    CPAP nightly x 1 yr, used nightly.   . Osteoarthritis of knees, bilateral     and back   . SBO (small bowel obstruction) 09/2018   . TIA (transient ischemic attack) 2014    TIA 2014-no residual. Patient evaulated for possible TIA 07/12/2015  @ [facility] (dischage summary in epic)-per summary CT of head was normal and patient was d/c'd     Family  History   Problem Relation Age of Onset   . Cancer Mother 33        breast cancer   . Breast cancer Mother    . Heart disease Father    . Malignant hyperthermia Neg Hx    . Pseudochol deficiency Neg Hx      Current Outpatient Medications   Medication Sig Dispense Refill   . acetaminophen (TYLENOL) 325 MG tablet Take 650 mg by mouth every 4 (four) hours as needed for Pain     . aspirin EC 81 MG EC tablet Take 1 tablet (81 mg total) by mouth daily. 30 tablet 1   . atenolol (TENORMIN) 25 MG tablet Take 1 tablet (25 mg total) by mouth 2 (two) times daily 180 tablet 0   . fluticasone (FLONASE) 50 MCG/ACT nasal spray 2 sprays by Nasal route daily. (Patient taking differently: 2 sprays by Nasal route as needed.   ) 16 g 2   . meclizine (ANTIVERT) 25 MG tablet Take 1 tablet (25 mg total) by mouth 3 (three) times daily as needed for Dizziness or Nausea 30 tablet 2   . metoclopramide (REGLAN) 5 MG tablet  Take 1 tablet (5 mg total) by mouth 4 (four) times daily as needed (nausea or abd pain) 10 tablet 0   . Multiple Vitamin (MULTIVITAMIN) capsule Take 1 capsule by mouth daily     . nystatin (NYSTOP) powder Apply to affected area 3 times daily 60 g 1   . ondansetron (ZOFRAN-ODT) 4 MG disintegrating tablet Take 4 mg by mouth every 8 (eight) hours as needed for Nausea     . pantoprazole (PROTONIX) 40 MG tablet TAKE 1 TABLET(40 MG) BY MOUTH DAILY (Patient taking differently: Take 40 mg by mouth every morning   ) 90 tablet 1   . valACYclovir HCL (VALTREX) 500 MG tablet TAKE 1 TABLET(500 MG) BY MOUTH DAILY (Patient taking differently: Take 500 mg by mouth as needed TAKE 1 TABLET(500 MG) BY MOUTH DAILY  ) 90 tablet 1     No current facility-administered medications for this visit.           PE:    Vitals:    04/18/19 1308   BP: 175/78   Pulse: 60   Temp: (!) 96.1 F (35.6 C)     Body mass index is 26.45 kg/m.    Physical Examination: General appearance - alert, well appearing, and in no distress  Chest - clear to auscultation, no  wheezes, rales or rhonchi, symmetric air entry  Heart - normal rate and regular rhythm  Abdomen - soft, nontender, nondistended,   Musculoskeletal - djd  Extremities - no pedal edema noted    EKG:    SB 59, LVH by voltage    Labs:  Lipid Panel   Cholesterol   Date/Time Value Ref Range Status   08/16/2018 07:34 AM 187 <200 mg/dL Final     Triglycerides   Date/Time Value Ref Range Status   08/16/2018 07:34 AM 61 <150 mg/dL Final   16/08/9603 54:09 AM 26 (L) 34 - 149 mg/dL Final     HDL   Date/Time Value Ref Range Status   08/16/2018 07:34 AM 80 >50 mg/dL Final       CMP:   Sodium   Date/Time Value Ref Range Status   03/29/2019 11:10 AM 142 136 - 145 mEq/L Final     Potassium   Date/Time Value Ref Range Status   03/29/2019 11:10 AM 4.3 3.5 - 5.1 mEq/L Final     Chloride   Date/Time Value Ref Range Status   03/29/2019 11:10 AM 106 100 - 111 mEq/L Final   08/16/2018 07:34 AM 107 98 - 110 mmol/L Final     CO2   Date/Time Value Ref Range Status   03/29/2019 11:10 AM 27 21 - 29 mEq/L Final     Glucose   Date/Time Value Ref Range Status   03/29/2019 11:10 AM 88 70 - 100 mg/dL Final     Comment:     ADA guidelines for diabetes mellitus:  Fasting:  Equal to or greater than 126 mg/dL  Random:   Equal to or greater than 200 mg/dL       BUN   Date/Time Value Ref Range Status   03/29/2019 11:10 AM 13.0 7.0 - 19.0 mg/dL Final     Protein, Total   Date/Time Value Ref Range Status   08/31/2018 05:46 PM 7.6 6.0 - 8.3 g/dL Final   81/19/1478 29:56 AM 6.5 6.1 - 8.1 g/dL Final     Alkaline Phosphatase   Date/Time Value Ref Range Status   08/31/2018 05:46 PM 103 37 - 106 U/L Final  AST (SGOT)   Date/Time Value Ref Range Status   08/31/2018 05:46 PM 20 5 - 34 U/L Final     ALT   Date/Time Value Ref Range Status   08/31/2018 05:46 PM 13 0 - 55 U/L Final     Anion Gap   Date/Time Value Ref Range Status   09/02/2018 04:05 AM 10.0 5.0 - 15.0 Final       CBC:   WBC   Date/Time Value Ref Range Status   03/29/2019 11:10 AM 5.11 3.10 - 9.50 x10  3/uL Final   06/30/2009 04:03 PM 9.12 3.50 - 10.80 /CUMM Final     RBC   Date/Time Value Ref Range Status   03/29/2019 11:10 AM 4.51 3.90 - 5.10 x10 6/uL Final     Hemoglobin   Date/Time Value Ref Range Status   08/16/2018 07:34 AM 12.3 11.7 - 15.5 g/dL Final     Hgb   Date/Time Value Ref Range Status   03/29/2019 11:10 AM 12.2 11.4 - 14.8 g/dL Final     Hematocrit   Date/Time Value Ref Range Status   03/29/2019 11:10 AM 40.5 34.7 - 43.7 % Final     MCV   Date/Time Value Ref Range Status   03/29/2019 11:10 AM 89.8 78.0 - 96.0 fL Final     MCHC   Date/Time Value Ref Range Status   03/29/2019 11:10 AM 30.1 (L) 31.5 - 35.8 g/dL Final     RDW   Date/Time Value Ref Range Status   03/29/2019 11:10 AM 16 (H) 11 - 15 % Final     Platelets   Date/Time Value Ref Range Status   03/29/2019 11:10 AM 258 142 - 346 x10 3/uL Final   08/16/2018 07:34 AM 252 140 - 400 Thousand/uL Final           Impression / plan    Hypertension: Controlled.  Continue current regimen of atenolol 25 mg twice a day.  She also has underlying element of whitecoat hypertension.  Blood pressure elevated in office today but home readings are normal.    Palpitations: Controlled.  Continue atenolol 25 mg twice a day.          Rieley Hausman Dan Humphreys  04/18/2019

## 2019-04-24 ENCOUNTER — Telehealth (HOSPITAL_BASED_OUTPATIENT_CLINIC_OR_DEPARTMENT_OTHER): Payer: Self-pay

## 2019-04-24 DIAGNOSIS — Z01818 Encounter for other preprocedural examination: Secondary | ICD-10-CM

## 2019-04-24 NOTE — Telephone Encounter (Signed)
Placed an order for C19 pre-procedural testing.

## 2019-04-24 NOTE — Telephone Encounter (Signed)
Patient is scheduled for DOS 05/22/19 with Dr. Tye Savoy, COVID Lab scheduled, pending TE to RN to order COVID Lab test and do screening questions

## 2019-05-10 ENCOUNTER — Encounter (INDEPENDENT_AMBULATORY_CARE_PROVIDER_SITE_OTHER): Payer: Self-pay

## 2019-05-11 ENCOUNTER — Encounter (INDEPENDENT_AMBULATORY_CARE_PROVIDER_SITE_OTHER): Payer: Self-pay

## 2019-05-17 NOTE — Telephone Encounter (Signed)
LVM to call us back at [phone] & sent an eCare message

## 2019-05-18 ENCOUNTER — Telehealth (HOSPITAL_BASED_OUTPATIENT_CLINIC_OR_DEPARTMENT_OTHER): Payer: Self-pay

## 2019-05-20 ENCOUNTER — Ambulatory Visit (INDEPENDENT_AMBULATORY_CARE_PROVIDER_SITE_OTHER): Payer: No Typology Code available for payment source

## 2019-05-22 ENCOUNTER — Encounter (HOSPITAL_BASED_OUTPATIENT_CLINIC_OR_DEPARTMENT_OTHER): Payer: No Typology Code available for payment source | Admitting: Gastroenterology

## 2019-05-22 ENCOUNTER — Encounter (INDEPENDENT_AMBULATORY_CARE_PROVIDER_SITE_OTHER): Payer: Self-pay | Admitting: "Endocrinology

## 2019-05-22 ENCOUNTER — Telehealth (INDEPENDENT_AMBULATORY_CARE_PROVIDER_SITE_OTHER): Payer: Medicare Other | Admitting: "Endocrinology

## 2019-05-22 VITALS — Ht 61.0 in | Wt 135.0 lb

## 2019-05-22 DIAGNOSIS — E221 Hyperprolactinemia: Secondary | ICD-10-CM

## 2019-05-22 DIAGNOSIS — M810 Age-related osteoporosis without current pathological fracture: Secondary | ICD-10-CM

## 2019-05-22 DIAGNOSIS — I10 Essential (primary) hypertension: Secondary | ICD-10-CM

## 2019-05-22 NOTE — Progress Notes (Signed)
This is a video visit due to the COVID-19 pandemic.  Verbal consent has been obtained from the patient to conduct a video visit to minimize exposure to COVID-19.      Chief Complaint:  Chief Complaint   Patient presents with   . pituitary microadenoma     self referred        HPI:  Amber [name] is a 68 y.o. 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      She reports fluctuating blood pressures despite weight loss.   She reports anxiety that began 1 week ago.    Imaging:  X-ray LS 10/22/18:  Compression fracture of L1 resulting in mild loss of vertebral body height, new since 08/31/2018.  MRI Brain 01/24/18: The pituitary gland is contained within the sella and has normal appearance.  DEXA 12/29/18: L1-L4 TS -1.6; LFN TS -2.5; LTH TS -2.3    Vitamin D: unable to tolerate  Multivitamin 1 tablet daily has vitamin D 2000IU daily and 100mg  calcium  Calcium chews BID - does not take regularly  Alendronate 70mg  weekly    Had developed amenorrhea in her 30s.  Thinks it may have had something to do with hyperprolactinemia. She was offered OCPs but she did not take them.    She was seen at [facility] in 2007.  She has been treated there since 1999.  "She had elevation of prolactin (highest in the 70s range in the past). 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 The 2-3 mm pituitary microadenoma was noted on follow up MRI (07/2001)." She was on dostinex until 2006.        Problem List:  Patient Active Problem List   Diagnosis   . Essential hypertension   . Anxiety state, unspecified   . Abnormal electrocardiogram   . Cerebral infarction   . Gastroesophageal reflux disease   .  Hypernatremia   . Paresthesia   . Slow transit constipation   . Vitamin D deficiency   . Overactive bladder   . S/P laparoscopic cholecystectomy   . Transient cerebral ischemia, unspecified type   . Palpitations   . Pituitary microadenoma   . Dyslipidemia   . Other long term (current) drug therapy   . Headache   . History of spinal surgery   . Obstructive sleep apnea syndrome   . Hiatal hernia   . Osteoarthritis of knees, bilateral   . Bariatric surgery status   . Nutritional deficiency   . Anxiety   . Epigastric abdominal tenderness without rebound tenderness   . Encounter for vitamin deficiency screening   . Nausea   . Other dysphagia   . Small bowel obstruction due to adhesions   . Allergic rhinitis   . Chronic frontal sinusitis   . Chronic sphenoidal sinusitis   . Nasal obstruction   . Sinusitis   . Primary osteoarthritis of both knees       Current Medications:  Current Outpatient Medications on File Prior to Visit   Medication Sig Dispense Refill   . alendronate (FOSAMAX) 70 MG tablet every 30 (thirty) days     . aspirin EC 81 MG EC tablet Take 1 tablet (81 mg total) by mouth daily. 30 tablet 1   .  atenolol (TENORMIN) 25 MG tablet Take 1 tablet (25 mg total) by mouth 2 (two) times daily 180 tablet 0   . fluticasone (FLONASE) 50 MCG/ACT nasal spray 2 sprays by Nasal route daily. (Patient taking differently: 2 sprays by Nasal route as needed.   ) 16 g 2   . Multiple Vitamin (MULTIVITAMIN) capsule Take 1 capsule by mouth daily     . ondansetron (ZOFRAN-ODT) 4 MG disintegrating tablet Take 4 mg by mouth every 8 (eight) hours as needed for Nausea     . pantoprazole (PROTONIX) 40 MG tablet TAKE 1 TABLET(40 MG) BY MOUTH DAILY (Patient taking differently: Take 40 mg by mouth every morning   ) 90 tablet 1   . valACYclovir HCL (VALTREX) 500 MG tablet TAKE 1 TABLET(500 MG) BY MOUTH DAILY (Patient taking differently: Take 500 mg by mouth as needed TAKE 1 TABLET(500 MG) BY MOUTH DAILY  ) 90 tablet 1   . [DISCONTINUED]  acetaminophen (TYLENOL) 325 MG tablet Take 650 mg by mouth every 4 (four) hours as needed for Pain     . [DISCONTINUED] meclizine (ANTIVERT) 25 MG tablet Take 1 tablet (25 mg total) by mouth 3 (three) times daily as needed for Dizziness or Nausea 30 tablet 2   . [DISCONTINUED] metoclopramide (REGLAN) 5 MG tablet Take 1 tablet (5 mg total) by mouth 4 (four) times daily as needed (nausea or abd pain) 10 tablet 0     No current facility-administered medications on file prior to visit.        Allergies:  Allergies   Allergen Reactions   . Acyclovir Nausea And Vomiting     Other reaction(s): gi distress   . Amlodipine      nausea   . Amoxicillin-Pot Clavulanate      Other reaction(s): gi distress     . Aspirin Nausea And Vomiting     Patient states "it upsets my stomach"  Other reaction(s): gi distress  Upsets stomach  Able to tolerate enteric coated aspirin   . Atorvastatin        Palpitation   . Carvedilol      Extreme fatigue   . Lisinopril      nausea   . Lovastatin Nausea And Vomiting     Other reaction(s): gi distress   . Metronidazole Nausea And Vomiting     Other reaction(s): gi distress   . Moxifloxacin Nausea And Vomiting         GI symptoms   . Rosuvastatin      Palpitation, chest pain, abd pain    . Statins      Chest pain, palpitations.   . Sulfa Antibiotics Nausea And Vomiting     Other reaction(s): gi distress       Past Medical History:  Past Medical History:   Diagnosis Date   . Asthma    . Bronchitis 01/02/2019   . Chest pain 2016     Chest pain after eating x6  months--being followed by GI for GERD   . Claustrophobia     MRIs   . Constipation    . Genital herpes     no current outbreak (10/03/15)   . GERD (gastroesophageal reflux disease)    . Hiatal hernia     h/o surgery   . Hyperlipidemia    . Hypertensive disorder     Well controlled on meds per pt. Elevated with pain.   . Low back pain    . Nausea without vomiting    .  OSA on CPAP 2015    CPAP nightly x 1 yr, used nightly.   . Osteoarthritis of  knees, bilateral     and back   . SBO (small bowel obstruction) 09/2018   . TIA (transient ischemic attack) 2014    TIA 2014-no residual. Patient evaulated for possible TIA 07/12/2015  @ [facility] (dischage summary in epic)-per summary CT of head was normal and patient was d/c'd       Past Surgical History:  Past Surgical History:   Procedure Laterality Date   . APPENDECTOMY  >25 yrs   . CHOLECYSTECTOMY     . EGD  01/2015   . EGD N/A 01/23/2019    Procedure: EGD;  Surgeon: Champ Mungo, MD;  Location: [facility];  Service: General;  Laterality: N/A;  ENDOSCOPY  MD REQ=30MINS; Q1=UNK   . EGD, BIOPSY N/A 09/15/2017    Procedure: EGD, BIOPSY;  Surgeon: Pershing Proud, MD;  Location: [facility];  Service: Gastroenterology;  Laterality: N/A;   . fallopian tube surgery  >25 yrs   . INSERTION, PAIN PUMP (MEDICAL) N/A 10/22/2015    Procedure: INSERTION, PAIN PUMP (MEDICAL);  Surgeon: Nicola Police, DO;  Location: [facility];  Service: General;  Laterality: N/A;   . LAPAROSCOPIC, CHOLECYSTECTOMY, CHOLANGIOGRAM  08/13/2014   . LAPAROSCOPIC, GASTRIC BYPASS N/A 10/22/2015    Procedure: LAPAROSCOPIC, GASTRIC BYPASS;  Surgeon: Jeanell Sparrow, Hamid R, DO;  Location: [facility];  Service: General;  Laterality: N/A;   . LAPAROSCOPIC, HERNIORRHAPHY, HIATAL N/A 10/22/2015    Procedure: LAPAROSCOPIC, HERNIORRHAPHY, HIATAL;  Surgeon: Nicola Police, DO;  Location: [facility];  Service: General;  Laterality: N/A;   . LAPAROSCOPIC, LYSIS, ADHESIONS N/A 09/01/2018    Procedure: LAPAROSCOPIC, LYSIS, ADHESIONS RELEASE OF  SMALL BOWEL OBSTRUCTION;  Surgeon: Champ Mungo, MD;  Location: [facility];  Service: General;  Laterality: N/A;   . LAPAROSCOPY, DIAGNOSTIC N/A 09/01/2018    Procedure: LAPAROSCOPY, DIAGNOSTIC;  Surgeon: Champ Mungo, MD;  Location: [facility];  Service: General;  Laterality: N/A;   . OVARY SURGERY Right >25 yrs   . SPINE SURGERY  11/2013    cervical HNP x 2, Dr. Bufford Buttner        Family History:  Family History   Problem Relation Age of Onset   . Cancer Mother 66        breast cancer   . Breast cancer Mother    . Heart disease Father    . Malignant hyperthermia Neg Hx    . Pseudochol deficiency Neg Hx        Social History:  Social History     Socioeconomic History   . Marital status: Single     Spouse name: Not on file   . Number of children: 2   . Years of education: Not on file   . Highest education level: Not on file   Occupational History   . Occupation: Acupuncturist: [employer]    Social Needs   . Financial resource strain: Not on file   . Food insecurity     Worry: Not on file     Inability: Not on file   . Transportation needs     Medical: Not on file     Non-medical: Not on file   Tobacco Use   . Smoking status: Never Smoker   . Smokeless tobacco: Never Used   Substance and  Sexual Activity   . Alcohol use: Never     Frequency: Never   . Drug use: Never   . Sexual activity: Never     Birth control/protection: Post-menopausal   Lifestyle   . Physical activity     Days per week: Not on file     Minutes per session: Not on file   . Stress: Not on file   Relationships   . Social Wellsite geologist on phone: Not on file     Gets together: Not on file     Attends religious service: Not on file     Active member of club or organization: Not on file     Attends meetings of clubs or organizations: Not on file     Relationship status: Not on file   . Intimate partner violence     Fear of current or ex partner: Not on file     Emotionally abused: Not on file     Physically abused: Not on file     Forced sexual activity: Not on file   Other Topics Concern   . Dietary supplements / vitamins Yes   . Anesthesia problems No   . Blood thinners No   . Pregnant Not Asked   . Future Children Not Asked   . Number of Pregnancies? Not Asked   . Number of children Not Asked   . Miscarriages / Abortions? Not Asked   . Eats large amounts No   . Excessive Sweets No    . Skips meals No   . Eats excessive starches Yes   . Snacks or grazes No   . Emotional eater Yes   . Eats fried food Yes   . Eats fast food Yes   . Diet Center No   . HMR No   . [name] No   . [place] Weight Loss No   . Nutri-System No   . Opti-Fast / Medi-Fast No   . Overeaters Anonymous No   . [facility] Weight Loss Center No   . TOPS No   . Weight Watchers No   . Atkins No   . Binging / Purging No   . Body for Life No   . Cabbage Soup No   . Calorie Counting No   . Fasting No   . [name] No   . Health Spa No   . Herbal Life No   . High Protein No   . Low Carb No   . Low Fat No   . [facility] Diet No   . [place] Diet No   . Margie Billet Diet No   . [place] Diet No   . Slim Fast No   . [place] No   . Sugar Busters No   . Vomiting No   . Zone Diet No   . Stationary cycle or treadmill No   . Gym/fitness Classes No   . Home exercise/video No   . Swimming No   . Team sports No   . Weight training No   . Walking or running No   . Hospitalization No   . Hypnosis No   . Physical therapy No   . Psychological therapy No   . Residential program No   . Acutrim No   . Amphetamines No   . Anorex No   . Byetta No   . Dexatrim No   . Didrex No   . Fastin No   .  Fen - Phen No   . Ionamin / Adipex No   . Mazanor No   . Meridia No   . Obalan No   . Phendiet No   . Phentrol No   . Phentermine Yes   . Plegine No   . Pondimin No   . Qsymia No   . Prozac No   . Redux No   . Sanorex No   . [place] No   . Tepanole No   . Wechless No   . Wellbutrin No   . Xenical (Orlistat, Alli) No   . Other Med No   . No impairment Yes     Comment: Chronic Bilat Knee Pain   . Walks with cane/crutch Yes   . Requires a wheelchair No   . Bedridden No   . Are you currently being treated for depression? No   . Do you snore? Yes   . Are you receiving any medical or psychological services? No   . Do you ever wake up at night gasping for breath? Not Asked   . Do you have or have you been treated for an eating disorder? No   . Anyone ever told  you that you stop breathing while asleep? Not Asked   . Do you exercise regularly? No   . Have you or family member ever have trouble with anesthesia? No   Social History Narrative   . Not on file       ROS:   General: Denies weight gain or weight loss, height loss, + fatigue  Ophthalmologic: Denies blurry vision, double vision, eye pain  ENT: Denies difficulty swallowing, throat pain, ear pain  Endocrine: Denies polyuria, polydipsia, heat intolerance, cold intolerance  Respiratory: Denies cough, shortness of breath   Cardiovascular: Denies palpitations, chest pain, leg edema  Gastrointestinal: + abdominal pain, constipation, diarrhea, + nausea, + heart burn  Genitourinary: Denies frequent urination, painful urination.   Musculoskeletal: Denies leg cramps, muscle aches, back pain  Skin: Denies rash, + hair loss, denies hirsutism, + white discolorations on arms, legs  Neurologic: + dizziness, + headache,  Psychiatric: + anxiety, + insomnia,+ hypersomnolence      Visit Vitals  Ht 1.549 m (5\' 1" )   Wt 61.2 kg (135 lb)   BMI 25.51 kg/m        BP Readings from Last 3 Encounters:   04/18/19 175/78   04/07/19 174/83   03/29/19 (!) 179/105        Wt Readings from Last 4 Encounters:   05/22/19 1040 61.2 kg (135 lb)   04/18/19 1308 63.5 kg (140 lb)   04/07/19 0702 64.4 kg (142 lb)   03/29/19 1022 64.5 kg (142 lb 3.2 oz)         Physical Exam:  GENERAL APPEARANCE: alert, in no acute distress, well developed, well nourished  PSYCHIATRIC: normal mood, appropriate affect      Assessment/Plan:  Amber [name] is a 68 y.o. female with    1. Osteoporosis, unspecified osteoporosis type, unspecified pathological fracture presence    2. Hyperprolactinemia    3. Hypertension, unspecified type      A total of 30 minutes were spent with the patient, of which greater than 50% of the time was spent counseling the patient on getting adequate calcium and vitamin D given osteoporosis and h/o gastric bypass surgery, possible  hormonal/pituitary causes of hypertension, management of hyperprolactinemia, and coordinating care.       1. Osteoporosis, unspecified osteoporosis type, unspecified  pathological fracture presence  - Re-start calcium 500mg  BID  - Continue multivitamin  - Continue alendronate for osteoporosis complicated by vertebral compression fx    2. Hyperprolactinemia  - Check pituitary hormones including cortisol, IGF1, prolactin, LH, FSH          Orders Placed This Encounter   Procedures   . Insulin-like growth factor   . Cortisol   . TSH   . T4, free   . Prolactin   . Follicle stimulating hormone   . Luteinizing hormone   . ACTH   . Vitamin D,25 OH, Total   . Comprehensive metabolic panel   . Protein electrophoresis, urine        Medications Discontinued During This Encounter   Medication Reason   . acetaminophen (TYLENOL) 325 MG tablet Therapy completed   . meclizine (ANTIVERT) 25 MG tablet Therapy completed   . metoclopramide (REGLAN) 5 MG tablet Therapy completed       No follow-ups on file.    Zerita Boers, MD

## 2019-05-23 ENCOUNTER — Other Ambulatory Visit (FREE_STANDING_LABORATORY_FACILITY): Payer: Medicare Other

## 2019-05-23 DIAGNOSIS — E221 Hyperprolactinemia: Secondary | ICD-10-CM

## 2019-05-23 DIAGNOSIS — I10 Essential (primary) hypertension: Secondary | ICD-10-CM

## 2019-05-23 DIAGNOSIS — M810 Age-related osteoporosis without current pathological fracture: Secondary | ICD-10-CM

## 2019-05-23 LAB — COMPREHENSIVE METABOLIC PANEL
ALT: 14 U/L (ref 0–55)
AST (SGOT): 20 U/L (ref 5–34)
Albumin/Globulin Ratio: 1.3 (ref 0.9–2.2)
Albumin: 3.9 g/dL (ref 3.5–5.0)
Alkaline Phosphatase: 85 U/L (ref 37–106)
Anion Gap: 10 (ref 5.0–15.0)
BUN: 13 mg/dL (ref 7.0–19.0)
Bilirubin, Total: 0.5 mg/dL (ref 0.2–1.2)
CO2: 26 mEq/L (ref 21–29)
Calcium: 9.1 mg/dL (ref 8.5–10.5)
Chloride: 106 mEq/L (ref 100–111)
Creatinine: 0.7 mg/dL (ref 0.4–1.5)
Globulin: 3 g/dL (ref 2.0–3.7)
Glucose: 87 mg/dL (ref 70–100)
Potassium: 3.9 mEq/L (ref 3.5–5.1)
Protein, Total: 6.9 g/dL (ref 6.0–8.3)
Sodium: 142 mEq/L (ref 136–145)

## 2019-05-23 LAB — PROLACTIN: Prolactin: 23.3 ng/mL (ref 5.2–26.5)

## 2019-05-23 LAB — GFR: EGFR: >60

## 2019-05-23 LAB — HEMOLYSIS INDEX: Hemolysis Index: 4 (ref 0–18)

## 2019-05-23 LAB — VITAMIN D,25 OH,TOTAL: Vitamin D, 25 OH, Total: 31 ng/mL (ref 30–100)

## 2019-05-23 LAB — TSH: TSH: 1.41 u[IU]/mL (ref 0.35–4.94)

## 2019-05-23 LAB — T4, FREE: T4 Free: 1.11 ng/dL (ref 0.70–1.48)

## 2019-05-23 LAB — CORTISOL: Cortisol: 11.8 ug/dL

## 2019-05-23 LAB — FOLLICLE STIMULATING HORMONE: Follicle Stimulating Hormone: 91.39

## 2019-05-23 LAB — LUTEINIZING HORMONE: LH: 26.3

## 2019-05-24 LAB — PROTEIN ELECTROPHORESIS, URINE
Alpha 1, Urine: 10.7 %
Alpha 2, Urine: 13.8 %
Beta, Urine: 10.1 %
Protein, UR: <7 mg/dL (ref 1.0–14.0)
Urine Albumin%: 39.8 %
Urine Gamma %: 25.6 %
Urine Pre Albumin%: 0 %

## 2019-05-24 LAB — ELECTROPHORESIS REVIEW, URINE

## 2019-05-25 ENCOUNTER — Other Ambulatory Visit (INDEPENDENT_AMBULATORY_CARE_PROVIDER_SITE_OTHER): Payer: Self-pay | Admitting: Internal Medicine

## 2019-05-25 LAB — URINE IMMUNOFIXATION

## 2019-05-25 LAB — ACTH: Adrenocorticotropic hormone (ACTH): 32 pg/mL

## 2019-05-25 LAB — IFE REVIEW, URINE

## 2019-05-29 LAB — INSULIN-LIKE GROWTH FACTOR
IGF-1, Z Score: 1.65 (ref ?–2.0)
Insulin-like Growth Factor 1: 164 ng/mL (ref 34–194)

## 2019-05-31 ENCOUNTER — Encounter (INDEPENDENT_AMBULATORY_CARE_PROVIDER_SITE_OTHER): Payer: Self-pay | Admitting: "Endocrinology

## 2019-06-01 ENCOUNTER — Telehealth (HOSPITAL_BASED_OUTPATIENT_CLINIC_OR_DEPARTMENT_OTHER): Payer: Self-pay

## 2019-06-01 NOTE — Progress Notes (Signed)
Patient reviewed message in MyChart.

## 2019-06-01 NOTE — Telephone Encounter (Signed)
Patient left a message to schedule Hem consult with Dr Richardson Dopp, referred by Dr Rivka Safer. Patient lives closer to [place] or [employer] she is scheduled for 9/9

## 2019-06-10 ENCOUNTER — Encounter (INDEPENDENT_AMBULATORY_CARE_PROVIDER_SITE_OTHER): Payer: Self-pay

## 2019-06-11 ENCOUNTER — Encounter (INDEPENDENT_AMBULATORY_CARE_PROVIDER_SITE_OTHER): Payer: Self-pay

## 2019-06-19 NOTE — Telephone Encounter (Addendum)
I called the patient and reviewed with them the need to have COVID-19 testing performed prior to their arrival to the [facility] for their medically urgent outpatient endoscopy procedure. An order for the NCVQLT (COVID-19) lab test was signed and associated to diagnosis code Z01.818 for preoperative screening per [facility]'s Delegated COVID-19 RN Ordering Protocol.    I have reviewed with the patient our escort policy. Patient is allowed to bring one driver(family/friend) with them. The driver will be screened for symptoms at the front of the hospital and allowed to come back to the clinic with the patient. No children will be allowed in. We can accommodate bringing the patient to a meeting spot if needed.    I have assessed the patient for current symptoms:   . Do you have a fever (Temperature >101F or 38.3C)? NO  . Do you have a new cough or worsened cough from baseline in the past 14 days?  No  . Do you have a new shortness of breath or worsened shortness of breath from baseline in the past 14 days? No  . Have you had new persistent diarrhea in the past 14 days (unrelated to bowel prep)? No  . Have you had new significant loss of taste in the past 14 days? No  . Have you had new significant loss of smell in the past 14 days? No  . Have you been exposed to anyone who has tested positive for COVID-19 in the past 14 days? No    After assessing the patient and answering all the patient's questions, patient agrees to and is scheduled for Pre-Procedural COVID Testing We will evaluate their lab results before their procedure. Patient states understanding that their procedure will be reevaluated by our Endoscopist and possibly rescheduled if their lab comes back as positive for COVID-19. Patient has no further questions at this time   Answered mult questions about driver, directions and location

## 2019-06-20 ENCOUNTER — Ambulatory Visit (INDEPENDENT_AMBULATORY_CARE_PROVIDER_SITE_OTHER): Payer: No Typology Code available for payment source

## 2019-06-20 ENCOUNTER — Other Ambulatory Visit
Admit: 2019-06-20 | Discharge: 2019-06-20 | Disposition: A | Discharge status: Home / Self Care | Payer: No Typology Code available for payment source | Attending: Gastroenterology | Admitting: Gastroenterology

## 2019-06-20 DIAGNOSIS — Z20828 Contact with and (suspected) exposure to other viral communicable diseases: Secondary | ICD-10-CM | POA: Insufficient documentation

## 2019-06-20 DIAGNOSIS — Z01812 Encounter for preprocedural laboratory examination: Secondary | ICD-10-CM | POA: Insufficient documentation

## 2019-06-20 DIAGNOSIS — Z119 Encounter for screening for infectious and parasitic diseases, unspecified: Secondary | ICD-10-CM

## 2019-06-20 LAB — COVID-19 CORONAVIRUS QUALITATIVE PCR: COVID-19 Coronavirus Qual PCR Result: NOT DETECTED

## 2019-06-21 NOTE — Progress Notes (Deleted)
HISTORY OF PRESENT ILLNESS  Amber [name] is 68 y.o. female who presents for an initial consultation. 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 She presents today for a faint monoclonal IgG kappa band noted in the urine. CBC in 04/2019 showed no anemia. CMP in 05/2019 showed no evidence of renal insufficiency or hypercalcemia.     Today Amber [name]     Allergies   Allergen Reactions   . Acyclovir Nausea And Vomiting     Other reaction(s): gi distress   . Amlodipine      nausea   . Amoxicillin-Pot Clavulanate      Other reaction(s): gi distress     . Aspirin Nausea And Vomiting     Patient states "it upsets my stomach"  Other reaction(s): gi distress  Upsets stomach  Able to tolerate enteric coated aspirin   . Atorvastatin        Palpitation   . Carvedilol      Extreme fatigue   . Lisinopril      nausea   . Lovastatin Nausea And Vomiting     Other reaction(s): gi distress   . Metronidazole Nausea And Vomiting     Other reaction(s): gi distress   . Moxifloxacin Nausea And Vomiting         GI symptoms   . Rosuvastatin      Palpitation, chest pain, abd pain    . Statins      Chest pain, palpitations.   . Sulfa Antibiotics Nausea And Vomiting     Other reaction(s): gi distress     Current Outpatient Medications   Medication Sig Dispense Refill   . alendronate (FOSAMAX) 70 MG tablet every 30 (thirty) days     . aspirin EC 81 MG EC tablet Take 1 tablet (81 mg total) by mouth daily. 30 tablet 1   . atenolol (TENORMIN) 25 MG tablet Take 1 tablet (25 mg total) by mouth 2 (two) times daily 180 tablet 0   . fluticasone (FLONASE) 50 MCG/ACT nasal spray 2 sprays by Nasal route daily. (Patient taking differently: 2 sprays by Nasal route as needed.   ) 16 g 2   . Multiple Vitamin (MULTIVITAMIN) capsule Take 1 capsule by mouth daily     . ondansetron (ZOFRAN-ODT) 4 MG disintegrating tablet Take 4 mg by  mouth every 8 (eight) hours as needed for Nausea     . pantoprazole (PROTONIX) 40 MG tablet TAKE 1 TABLET(40 MG) BY MOUTH DAILY 90 tablet 0   . valACYclovir HCL (VALTREX) 500 MG tablet TAKE 1 TABLET(500 MG) BY MOUTH DAILY (Patient taking differently: Take 500 mg by mouth as needed TAKE 1 TABLET(500 MG) BY MOUTH DAILY  ) 90 tablet 1     No current facility-administered medications for this visit.      Past Medical History:   Diagnosis Date   . Asthma    . Bronchitis 01/02/2019   . Chest pain 2016     Chest pain after eating x6  months--being followed by GI for GERD   . Claustrophobia     MRIs   . Constipation    . Genital herpes     no current outbreak (10/03/15)   . GERD (gastroesophageal reflux disease)    . Hiatal hernia     h/o surgery   . Hyperlipidemia    . Hypertensive disorder     Well controlled on meds  per pt. Elevated with pain.   . Low back pain    . Nausea without vomiting    . OSA on CPAP 2015    CPAP nightly x 1 yr, used nightly.   . Osteoarthritis of knees, bilateral     and back   . SBO (small bowel obstruction) 09/2018   . TIA (transient ischemic attack) 2014    TIA 2014-no residual. Patient evaulated for possible TIA 07/12/2015  @ [facility] (dischage summary in epic)-per summary CT of head was normal and patient was d/c'd     Past Surgical History:   Procedure Laterality Date   . APPENDECTOMY  >25 yrs   . CHOLECYSTECTOMY     . EGD  01/2015   . EGD N/A 01/23/2019    Procedure: EGD;  Surgeon: Champ Mungo, MD;  Location: [facility];  Service: General;  Laterality: N/A;  ENDOSCOPY  MD REQ=30MINS; Q1=UNK   . EGD, BIOPSY N/A 09/15/2017    Procedure: EGD, BIOPSY;  Surgeon: Pershing Proud, MD;  Location: [facility];  Service: Gastroenterology;  Laterality: N/A;   . fallopian tube surgery  >25 yrs   . INSERTION, PAIN PUMP (MEDICAL) N/A 10/22/2015    Procedure: INSERTION, PAIN PUMP (MEDICAL);  Surgeon: Nicola Police, DO;  Location: [facility];  Service: General;  Laterality: N/A;   .  LAPAROSCOPIC, CHOLECYSTECTOMY, CHOLANGIOGRAM  08/13/2014   . LAPAROSCOPIC, GASTRIC BYPASS N/A 10/22/2015    Procedure: LAPAROSCOPIC, GASTRIC BYPASS;  Surgeon: Jeanell Sparrow, Hamid R, DO;  Location: [facility];  Service: General;  Laterality: N/A;   . LAPAROSCOPIC, HERNIORRHAPHY, HIATAL N/A 10/22/2015    Procedure: LAPAROSCOPIC, HERNIORRHAPHY, HIATAL;  Surgeon: Nicola Police, DO;  Location: [facility];  Service: General;  Laterality: N/A;   . LAPAROSCOPIC, LYSIS, ADHESIONS N/A 09/01/2018    Procedure: LAPAROSCOPIC, LYSIS, ADHESIONS RELEASE OF  SMALL BOWEL OBSTRUCTION;  Surgeon: Champ Mungo, MD;  Location: [facility];  Service: General;  Laterality: N/A;   . LAPAROSCOPY, DIAGNOSTIC N/A 09/01/2018    Procedure: LAPAROSCOPY, DIAGNOSTIC;  Surgeon: Champ Mungo, MD;  Location: [facility];  Service: General;  Laterality: N/A;   . OVARY SURGERY Right >25 yrs   . SPINE SURGERY  11/2013    cervical HNP x 2, Dr. Bufford Buttner     Family History   Problem Relation Age of Onset   . Cancer Mother 48        breast cancer   . Breast cancer Mother    . Heart disease Father    . Malignant hyperthermia Neg Hx    . Pseudochol deficiency Neg Hx      Social History     Socioeconomic History   . Marital status: Single     Spouse name: Not on file   . Number of children: 2   . Years of education: Not on file   . Highest education level: Not on file   Occupational History   . Occupation: Acupuncturist: [employer]    Social Needs   . Financial resource strain: Not on file   . Food insecurity     Worry: Not on file     Inability: Not on file   . Transportation needs     Medical: Not on file     Non-medical: Not on file   Tobacco Use   . Smoking status: Never Smoker   . Smokeless tobacco: Never  Used   Substance and Sexual Activity   . Alcohol use: Never     Frequency: Never   . Drug use: Never   . Sexual activity: Never     Birth control/protection: Post-menopausal   Lifestyle   .  Physical activity     Days per week: Not on file     Minutes per session: Not on file   . Stress: Not on file   Relationships   . Social Wellsite geologist on phone: Not on file     Gets together: Not on file     Attends religious service: Not on file     Active member of club or organization: Not on file     Attends meetings of clubs or organizations: Not on file     Relationship status: Not on file   . Intimate partner violence     Fear of current or ex partner: Not on file     Emotionally abused: Not on file     Physically abused: Not on file     Forced sexual activity: Not on file   Other Topics Concern   . Dietary supplements / vitamins Yes   . Anesthesia problems No   . Blood thinners No   . Pregnant Not Asked   . Future Children Not Asked   . Number of Pregnancies? Not Asked   . Number of children Not Asked   . Miscarriages / Abortions? Not Asked   . Eats large amounts No   . Excessive Sweets No   . Skips meals No   . Eats excessive starches Yes   . Snacks or grazes No   . Emotional eater Yes   . Eats fried food Yes   . Eats fast food Yes   . Diet Center No   . HMR No   . [name] No   . [place] Weight Loss No   . Nutri-System No   . Opti-Fast / Medi-Fast No   . Overeaters Anonymous No   . Physicians Weight Loss Center No   . TOPS No   . Weight Watchers No   . Atkins No   . Binging / Purging No   . Body for Life No   . Cabbage Soup No   . Calorie Counting No   . Fasting No   . [name] No   . Health Spa No   . Herbal Life No   . High Protein No   . Low Carb No   . Low Fat No   . Mayo Clinic Diet No   . [place] Diet No   . Margie Billet Diet No   . [place] Diet No   . Slim Fast No   . [place] No   . Sugar Busters No   . Vomiting No   . Zone Diet No   . Stationary cycle or treadmill No   . Gym/fitness Classes No   . Home exercise/video No   . Swimming No   . Team sports No   . Weight training No   . Walking or running No   . Hospitalization No   . Hypnosis No   . Physical therapy No   .  Psychological therapy No   . Residential program No   . Acutrim No   . Amphetamines No   . Anorex No   . Byetta No   . Dexatrim No   . Didrex No   .  Fastin No   . Fen - Phen No   . Ionamin / Adipex No   . Mazanor No   . Meridia No   . Obalan No   . Phendiet No   . Phentrol No   . Phentermine Yes   . Plegine No   . Pondimin No   . Qsymia No   . Prozac No   . Redux No   . Sanorex No   . Tenuate No   . Tepanole No   . Wechless No   . Wellbutrin No   . Xenical (Orlistat, Alli) No   . Other Med No   . No impairment Yes     Comment: Chronic Bilat Knee Pain   . Walks with cane/crutch Yes   . Requires a wheelchair No   . Bedridden No   . Are you currently being treated for depression? No   . Do you snore? Yes   . Are you receiving any medical or psychological services? No   . Do you ever wake up at night gasping for breath? Not Asked   . Do you have or have you been treated for an eating disorder? No   . Anyone ever told you that you stop breathing while asleep? Not Asked   . Do you exercise regularly? No   . Have you or family member ever have trouble with anesthesia? No   Social History Narrative   . Not on file       REVIEW OF SYSTEMS  ROS     Objective:      Physical Exam       Assessment & Plan:   Ms. 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 She presents for small monoclonal protein (IgG kappa) seen in the urine.     1. Monoclonal protein. I explained to Ms. Leopard that the significance of this finding is unclear. We do know that certain hematologic malignancies can start with a monoclonal protein which progresses  (myeloma, lymphoma). There are no suspicious findings for this so far.     I would, however, like to repeat her labs today (CBC, CMP) along with SPEP with immunofixation, IgG, IgA, IgM, and free light chains as well as 24 hour UPEP with immunofixation  and light chains. She should also have skeletal survey to look for lytic bone lesions.     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     The above studies will be ordered today and Ms. Boxwell will follow up with me in approximately 2 weeks to review results.     2. Disposition. All of Ms. Kuk's questions today were answered to her apparent satisfaction. She knows to call with any further questions or concerns.    Time spent  min: >50% in direct consultation with the patient about '

## 2019-06-21 NOTE — Progress Notes (Signed)
Patient was seen on 06/20/2019 at the [facility] drive up site where a sample of dual nasal pharyngeal collection was taken. The specimen was sent to the [facility] lab for COVID-19 testing.  Patient will be informed of test results within 48 hours.  Patient received informational instructions on self-care.    The specimen was collected by: Elizabeth Naughtin, RN

## 2019-06-21 NOTE — Patient Instructions (Signed)
Evaluation for COVID-19  Testing, Result Information, Symptom Management    Who is being tested for COVID-19?  [facility] is testing patients for COVID-19 for:  1. Patients who have symptoms that may be related to COVID-19  2. Patients who were exposed to COVID-19  3. Patients who require testing for travel or to return to work  4. Patients who do not have symptoms, but have an upcoming surgery or procedure that requires routine COVID-19 testing beforehand    If a COVID-19 test was ordered, what number do I call to set up a swabbing appointment?  You may call the [facility] COVID-19 Line at [phone].    If you are experiencing symptoms, what do we believe you have?  You have a viral syndrome, which may include symptoms like muscle aches, fevers, chills, runny nose, cough, sneezing, sore throat, vomiting or diarrhea.     SARS-CoV-2, the virus that causes COVID-19, is one of the potential viruses you may have. You may be just as likely to have a different viral infection such as the common cold or flu.    Most patients with COVID-19 have mild symptoms and recover on their own. Resting, staying hydrated, and sleeping are typically helpful. As of today's visit, you are well enough to go home and treat your symptoms with oral fluids and medicines for fevers, cough, pain, etc. If your symptoms worsen, you should seek additional medical care.    Why is COVID-19 testing being performed before my surgery/procedure?  The safety of our patients and staff is our top priority. We are performing COVID-19 testing before certain surgeries and procedures to maintain everyone's safety and help prevent others from getting infected or exposed.    When will I receive results for my COVID-19 test?  If COVID-19 testing is performed, the results should be available in 1-2 days. You may find testing follow-up instructions here: [email or web address]    Who can I contact for questions?  Call  [phone] for any COVID-19 questions or if your symptoms are worsening. Please allow 48 hours for results to finalize before contacting us about your result status.    How do I receive results?  Please do not contact the Emergency Department or clinic for results of this test. Please wait to be contacted as outlined below and do not go to your doctor's office for results.    IF THE RESULT IS POSITIVE OR INCONCLUSIVE  A member of the [facility] team will call you for further discussion. You may also view your result in eCare or through a QR code you may receive at your testing site.    IF THE RESULT IS NEGATIVE  You will receive this information by phone, via eCare, or a QR code you may receive at your testing site.    Pre-surgical evaluations  A member of your surgery team will review your results and contact you if needed. Please remain isolated until your surgery date to reduce the risk of COVID-19 exposure.    eCare  If you are a  Medicine patient, eCare ([email or web address]) is the fastest way to receive your results. Results will be released to eCare within one hour of being posted in our system, and you may receive your results before we are able to contact you.    QR Code  You may receive a QR code label at the time of your test. If you do not have an eCare account, you may use the QR code label to view   your results at [email or web address]. You will not receive a notification when your result is ready to view on this site, but you can visit the site as many times as you wish to check the result status.    What do I do while I wait for my test results?   Stay home except to get medical care. Do not return to work or your regular activities outside of home. Remain isolated until you receive your results.    After receiving your results, follow the instructions here: [email or web address]    Please follow the precautions below:   Stay home except to  get medical care.     Do not go to work, school, or public areas. Avoid using public transportation, ride-sharing, or taxis.   Separate yourself from other people in your home as much as possible.   Stay in a specific room and away from other people in your home as much as possible. Use a separate bathroom if possible.   Do not share household items with other people in your home.   This includes sharing dishes, drinking glasses, cups, eating utensils, towels or bedding. After using these items, they should be washed thoroughly with soap and water.   Clean all "high-touch" surfaces regularly.   This includes counters, tabletops, doorknobs, bathroom fixtures, toilets, phones, keyboards, tablets and bedside tables. Also, clean any surfaces that may have blood, stool or body fluids on them. Use a household cleaning spray or wipe, according to the label instructions.   Cover your coughs and sneezes with a tissue, mask or the inside of your elbow.   Throw used tissues in a lined trash can; immediately wash your hands with soap and water for at least 20 seconds or clean your hands with an alcohol-based hand sanitizer that contains at least 60% alcohol. Soap and water should be used if hands are visibly dirty.   When seeking care at a healthcare facility:   Seek prompt medical attention if your illness is worsening (e.g., difficulty breathing).   When possible, call the healthcare provider before arriving.   Put on a facemask before you enter the facility.   If possible, put on a facemask before the ambulance or paramedics arrive.   These steps will help the healthcare provider's office prevent other people from getting infected or exposed.      Please see the resources below for more information  Information Lines  [place] [facility] COVID-19 Call Center: 1-800-525-0127   [facility] COVID-19 Line: [phone]    [facility] Websites  COVID-19  Information  [email or web address]    [place] Department of Health Websites  General Facts on COVID-19  doh.[place].gov/Emergencies/NovelCoronavirusOutbreak2020/FactSheet    What to do if you were potentially exposed to someone with confirmed coronavirus disease (COVID-19)  doh.wa.gov/Portals/1/Documents/1600/coronavirus/COVIDexposed.pdf    Centers for Disease Control and Prevention (CDC) Websites  COVID-19 FAQs:  cdc.gov/coronavirus/2019-ncov/faq.html    What to do if you are sick: cdc.gov/coronavirus/2019-ncov/if-you-are-sick/steps-when-sick.html

## 2019-06-22 ENCOUNTER — Ambulatory Visit: Payer: No Typology Code available for payment source | Attending: Gastroenterology | Admitting: Gastroenterology

## 2019-06-22 DIAGNOSIS — K862 Cyst of pancreas: Secondary | ICD-10-CM | POA: Insufficient documentation

## 2019-06-22 DIAGNOSIS — D490 Neoplasm of unspecified behavior of digestive system: Secondary | ICD-10-CM | POA: Insufficient documentation

## 2019-06-22 DIAGNOSIS — Z1289 Encounter for screening for malignant neoplasm of other sites: Secondary | ICD-10-CM | POA: Insufficient documentation

## 2019-06-22 DIAGNOSIS — R933 Abnormal findings on diagnostic imaging of other parts of digestive tract: Secondary | ICD-10-CM | POA: Insufficient documentation

## 2019-06-22 DIAGNOSIS — R930 Abnormal findings on diagnostic imaging of skull and head, not elsewhere classified: Secondary | ICD-10-CM | POA: Insufficient documentation

## 2019-06-23 ENCOUNTER — Telehealth (HOSPITAL_BASED_OUTPATIENT_CLINIC_OR_DEPARTMENT_OTHER): Payer: Self-pay | Admitting: Gastroenterology

## 2019-06-23 NOTE — Telephone Encounter (Signed)
Post procedure follow up.  Dr Tye Savoy recommends the following:     - Perform magnetic resonance imaging (MRI) with                        gadolinium in 1 year.                       - Repeat the upper endoscopic ultrasound in 2 years for                        surveillance.    [facility] - please place the patient on a recall for MRI in 1 year and EUS in 2 years.    Thx

## 2019-06-27 NOTE — Telephone Encounter (Signed)
1 yr MRI and 2 yr EUS recalls created

## 2019-07-03 ENCOUNTER — Encounter (INDEPENDENT_AMBULATORY_CARE_PROVIDER_SITE_OTHER): Payer: Self-pay | Admitting: Internal Medicine

## 2019-07-05 ENCOUNTER — Telehealth: Payer: Self-pay | Admitting: Hematology & Oncology

## 2019-07-05 ENCOUNTER — Other Ambulatory Visit (INDEPENDENT_AMBULATORY_CARE_PROVIDER_SITE_OTHER): Payer: Self-pay | Admitting: Internal Medicine

## 2019-07-05 DIAGNOSIS — R809 Proteinuria, unspecified: Secondary | ICD-10-CM

## 2019-07-05 NOTE — Telephone Encounter (Signed)
Pt called and left a message to cancel 07/12/2019 as shown below.  Appt has been cancelled and patient requests a call back at [phone].

## 2019-07-05 NOTE — Telephone Encounter (Signed)
Spoke to patient to reschedule appointment. She stated that she was able to find a hematologist closer to her house.

## 2019-07-06 ENCOUNTER — Telehealth (HOSPITAL_BASED_OUTPATIENT_CLINIC_OR_DEPARTMENT_OTHER): Payer: Self-pay

## 2019-07-06 NOTE — Telephone Encounter (Signed)
Home phone no answer when it does it asks for an access code    Cell phone went to VM could not leave a message because mailbox is full     Pt does not have eCare    Unable to do a post procedure c19 followup call

## 2019-07-09 ENCOUNTER — Ambulatory Visit (HOSPITAL_COMMUNITY): Payer: Medicare Other | Admitting: Surgery

## 2019-07-09 DIAGNOSIS — R1013 Epigastric pain: Secondary | ICD-10-CM

## 2019-07-09 DIAGNOSIS — K219 Gastro-esophageal reflux disease without esophagitis: Secondary | ICD-10-CM

## 2019-07-09 DIAGNOSIS — Z9884 Bariatric surgery status: Secondary | ICD-10-CM

## 2019-07-09 DIAGNOSIS — I10 Essential (primary) hypertension: Secondary | ICD-10-CM

## 2019-07-09 DIAGNOSIS — R110 Nausea: Secondary | ICD-10-CM

## 2019-07-09 DIAGNOSIS — R079 Chest pain, unspecified: Secondary | ICD-10-CM

## 2019-07-09 DIAGNOSIS — R10816 Epigastric abdominal tenderness: Secondary | ICD-10-CM

## 2019-07-09 MED ORDER — BISACODYL 10 MG RE SUPP
10.00 mg | RECTAL | Status: DC
Start: ? — End: 2019-07-09

## 2019-07-09 MED ORDER — DOCUSATE SODIUM 100 MG PO CAPS
100.00 mg | ORAL_CAPSULE | ORAL | Status: DC
Start: 2019-07-10 — End: 2019-07-09

## 2019-07-09 MED ORDER — GENERIC EXTERNAL MEDICATION
Status: DC
Start: ? — End: 2019-07-09

## 2019-07-09 MED ORDER — ALUM & MAG HYDROXIDE-SIMETH 400-400-40 MG/5ML PO SUSP
30.00 mL | ORAL | Status: DC
Start: 2019-07-09 — End: 2019-07-09

## 2019-07-09 MED ORDER — ATENOLOL 25 MG PO TABS
25.00 mg | ORAL_TABLET | ORAL | Status: DC
Start: 2019-07-09 — End: 2019-07-09

## 2019-07-09 MED ORDER — FAMOTIDINE 20 MG PO TABS
20.00 mg | ORAL_TABLET | ORAL | Status: DC
Start: 2019-07-09 — End: 2019-07-09

## 2019-07-09 MED ORDER — AMLODIPINE BESYLATE 5 MG PO TABS
5.00 mg | ORAL_TABLET | ORAL | Status: DC
Start: 2019-07-10 — End: 2019-07-09

## 2019-07-09 MED ORDER — ENOXAPARIN SODIUM 40 MG/0.4ML SC SOLN
40.00 mg | SUBCUTANEOUS | Status: DC
Start: 2019-07-10 — End: 2019-07-09

## 2019-07-11 ENCOUNTER — Encounter (INDEPENDENT_AMBULATORY_CARE_PROVIDER_SITE_OTHER): Payer: Self-pay | Admitting: Internal Medicine

## 2019-07-11 ENCOUNTER — Encounter (INDEPENDENT_AMBULATORY_CARE_PROVIDER_SITE_OTHER): Payer: Self-pay

## 2019-07-11 MED ORDER — GENERIC EXTERNAL MEDICATION
Status: DC
Start: ? — End: 2019-07-11

## 2019-07-11 NOTE — Progress Notes (Signed)
See consult note

## 2019-07-12 ENCOUNTER — Encounter (INDEPENDENT_AMBULATORY_CARE_PROVIDER_SITE_OTHER): Payer: Self-pay

## 2019-07-12 ENCOUNTER — Ambulatory Visit: Payer: Medicare Other | Admitting: Hematology & Oncology

## 2019-07-24 ENCOUNTER — Ambulatory Visit (INDEPENDENT_AMBULATORY_CARE_PROVIDER_SITE_OTHER): Payer: Medicare Other | Admitting: Internal Medicine

## 2019-07-24 ENCOUNTER — Encounter (INDEPENDENT_AMBULATORY_CARE_PROVIDER_SITE_OTHER): Payer: Self-pay | Admitting: Internal Medicine

## 2019-07-24 ENCOUNTER — Other Ambulatory Visit (INDEPENDENT_AMBULATORY_CARE_PROVIDER_SITE_OTHER): Payer: Self-pay | Admitting: Internal Medicine

## 2019-07-24 VITALS — BP 176/84 | HR 59 | Temp 98.5°F | Ht 61.0 in | Wt 145.6 lb

## 2019-07-24 DIAGNOSIS — R0989 Other specified symptoms and signs involving the circulatory and respiratory systems: Secondary | ICD-10-CM | POA: Insufficient documentation

## 2019-07-24 DIAGNOSIS — R21 Rash and other nonspecific skin eruption: Secondary | ICD-10-CM

## 2019-07-24 DIAGNOSIS — I10 Essential (primary) hypertension: Secondary | ICD-10-CM

## 2019-07-24 MED ORDER — HEPARIN SODIUM (PORCINE) 5000 UNIT/ML IJ SOLN
5000.00 [IU] | INTRAMUSCULAR | Status: DC
Start: 2019-07-23 — End: 2019-07-24

## 2019-07-24 MED ORDER — OMEPRAZOLE 20 MG PO CPDR
40.00 mg | DELAYED_RELEASE_CAPSULE | ORAL | Status: DC
Start: 2019-07-24 — End: 2019-07-24

## 2019-07-24 MED ORDER — NITROGLYCERIN 0.4 MG SL SUBL
0.40 mg | SUBLINGUAL_TABLET | SUBLINGUAL | Status: DC
Start: ? — End: 2019-07-24

## 2019-07-24 MED ORDER — DOCUSATE SODIUM 100 MG PO CAPS
100.00 mg | ORAL_CAPSULE | ORAL | Status: DC
Start: 2019-07-24 — End: 2019-07-24

## 2019-07-24 MED ORDER — ATENOLOL 25 MG PO TABS
25.0000 mg | ORAL_TABLET | Freq: Two times a day (BID) | ORAL | 0 refills | Status: DC
Start: 2019-07-24 — End: 2019-07-27

## 2019-07-24 MED ORDER — ZOLPIDEM TARTRATE 5 MG PO TABS
5.00 mg | ORAL_TABLET | ORAL | Status: DC
Start: ? — End: 2019-07-24

## 2019-07-24 MED ORDER — BISACODYL 10 MG RE SUPP
10.00 mg | RECTAL | Status: DC
Start: ? — End: 2019-07-24

## 2019-07-24 MED ORDER — CLOTRIMAZOLE-BETAMETHASONE 1-0.05 % EX CREA
TOPICAL_CREAM | CUTANEOUS | 1 refills | Status: AC
Start: 2019-07-24 — End: 2020-07-23

## 2019-07-24 MED ORDER — SODIUM CHLORIDE 0.45 % IV SOLN
75.00 mL/h | INTRAVENOUS | Status: DC
Start: ? — End: 2019-07-24

## 2019-07-24 NOTE — Progress Notes (Signed)
Subjective:       Patient ID: Amber [name] is a 68 y.o. female.    HPI  Pt is here for follow up recent hosp admission    Admitted at [facility] recently 9/5-07/09/19 and  9/18-9/19/20 for high BP, chest pain  Labs ok EKG, ok, CTA,   Echo normal EF, mild LVH,   Fluctuation of BP, HR at home,   High 180's/100, P130's,   Low 89/58, P 60's,   Compliant with atenolol  Has card, Dr. Katrinka Blazing, to set up f/u ov.   occ palp, lightheaded with low BP, 3x/wk,   No syncope, cp resolved.     Rash intergluteal skin fold x few months, irritating, tried nystatin but not better.     The following portions of the patient's history were reviewed and updated as appropriate: allergies, current medications, past family history, past medical history, past social history, past surgical history and problem list.    Review of Systems   Constitutional: Negative for appetite change and unexpected weight change.   Respiratory: Negative for shortness of breath.    Cardiovascular: Negative for chest pain and palpitations.   Gastrointestinal: Negative for abdominal pain, nausea and vomiting.   Genitourinary: Negative for dysuria.   Musculoskeletal: Negative for myalgias.   Neurological: Negative for syncope, light-headedness and headaches.     BP 176/84   Pulse (!) 59   Temp 98.5 F (36.9 C) (Temporal)   Ht 1.549 m (5\' 1" )   Wt 66 kg (145 lb 9.6 oz)   BMI 27.51 kg/m          Objective:    Physical Exam  Constitutional:       General: She is not in acute distress.     Appearance: She is well-developed. She is not diaphoretic.   HENT:      Mouth/Throat:      Pharynx: No oropharyngeal exudate.   Eyes:      General: No scleral icterus.     Conjunctiva/sclera: Conjunctivae normal.   Neck:      Musculoskeletal: Neck supple.      Thyroid: No thyromegaly.      Vascular: No carotid bruit or JVD.   Cardiovascular:      Rate and Rhythm: Normal rate and regular rhythm.      Heart sounds: Normal heart sounds. No murmur. No friction rub. No gallop.     Pulmonary:      Effort: Pulmonary effort is normal. No respiratory distress.      Breath sounds: Normal breath sounds. No rales.   Abdominal:      General: Bowel sounds are normal. There is no distension.      Palpations: Abdomen is soft.      Tenderness: There is no abdominal tenderness. There is no guarding or rebound.   Musculoskeletal:      Right lower leg: No edema.      Left lower leg: No edema.   Neurological:      Mental Status: She is alert.             Assessment:       1. Labile hypertension     2. Essential hypertension  atenolol (TENORMIN) 25 MG tablet   3. Rash  clotrimazole-betamethasone (LOTRISONE) cream          Plan:      Procedures  No orders of the defined types were placed in this encounter.    Medications Ordered This Encounter  Disp Refills Start End    clotrimazole-betamethasone (LOTRISONE) cream 45 g 1 07/24/2019 07/23/2020    Apply to affected area 2 times daily    atenolol (TENORMIN) 25 MG tablet 180 tablet 0 07/24/2019     Take 1 tablet (25 mg total) by mouth 2 (two) times daily - Oral        Continue atenolol   Monitor BP closely  F/u with card, Dr. Katrinka Blazing for further evaluation/recs  rx lotrisone top  F/u 3 months

## 2019-07-27 ENCOUNTER — Other Ambulatory Visit (HOSPITAL_BASED_OUTPATIENT_CLINIC_OR_DEPARTMENT_OTHER): Payer: Self-pay

## 2019-07-27 ENCOUNTER — Ambulatory Visit (INDEPENDENT_AMBULATORY_CARE_PROVIDER_SITE_OTHER): Payer: Medicare Other | Admitting: Cardiovascular Disease

## 2019-07-27 ENCOUNTER — Encounter (INDEPENDENT_AMBULATORY_CARE_PROVIDER_SITE_OTHER): Payer: Self-pay | Admitting: Cardiovascular Disease

## 2019-07-27 VITALS — BP 169/95 | HR 58 | Temp 97.7°F | Resp 16 | Ht 61.0 in | Wt 142.8 lb

## 2019-07-27 DIAGNOSIS — I10 Essential (primary) hypertension: Secondary | ICD-10-CM

## 2019-07-27 MED ORDER — METOPROLOL SUCCINATE ER 50 MG PO TB24
50.00 mg | ORAL_TABLET | Freq: Every day | ORAL | 1 refills | Status: DC
Start: 2019-07-27 — End: 2019-08-24

## 2019-07-27 MED ORDER — DILTIAZEM HCL ER COATED BEADS 120 MG PO CP24
120.00 mg | ORAL_CAPSULE | Freq: Every day | ORAL | 0 refills | Status: DC
Start: 2019-07-27 — End: 2019-08-24

## 2019-07-27 NOTE — Progress Notes (Signed)
[facility] [place] [place] OFFICE VISIT    I had the pleasure of seeing Amber [name] today for cardiovascular follow up. She is a pleasant 68 y.o. female with a history of episodic  HTN , atypical chest pain  who presents for CV follow up . She has had BP 130-170 mmhg and has white coat HTN. She had an echo [facility] on recent admission for atypical CP and negative GI workup.      MEDICATIONS: She has a current medication list which includes the following prescription(s): alendronate - TAKE 1 TABLET BY MOUTH EVERY WEEK AS DIRECTED, aspirin ec - Take 1 tablet (81 mg total) by mouth daily, atenolol - Take 1 tablet (25 mg total) by mouth 2 (two) times daily, clotrimazole-betamethasone - Apply to affected area 2 times daily, fluticasone - 2 sprays by Nasal route daily (Patient taking differently: 2 sprays by Nasal route as needed.   ), multivitamin - Take 1 capsule by mouth daily, ondansetron - Take 4 mg by mouth every 8 (eight) hours as needed for Nausea, pantoprazole - TAKE 1 TABLET(40 MG) BY MOUTH DAILY, and valacyclovir hcl - TAKE 1 TABLET(500 MG) BY MOUTH DAILY (Patient taking differently: Take 500 mg by mouth as needed TAKE 1 TABLET(500 MG) BY MOUTH DAILY  ).    REVIEW OF SYSTEMS: All other systems reviewed and negative except as stated above.    PHYSICAL EXAMINATION  General Appearance: A well-appearing female in no acute distress.   Vital Signs: BP (!) 169/95 (BP Site: Left arm, Patient Position: Sitting, Cuff Size: Medium)   Pulse (!) 58   Temp 97.7 F (36.5 C)   Resp 16   Ht 1.549 m (5\' 1" )   Wt 64.8 kg (142 lb 12.8 oz)   SpO2 99%   BMI 26.98 kg/m    HEENT: Sclera anicteric, conjunctiva without pallor, moist mucous membranes, normal dentition.   Neck: Supple without jugular venous distention. Thyroid nonpalpable. Normal carotid upstrokes without bruits.  Chest: Clear to auscultation bilaterally with good air movement and respiratory effort and no wheezes, rales, or rhonchi  Cardiovascular: Normal S1  and physiologically split S2 without murmurs, gallops or rub. PMI of normal size and nondisplaced.   Abdomen: Soft, nontender. No organomegaly.  No pulsatile masses or bruits.    Extremities: Warm without edema. All peripheral pulses are full and equal.  Skin: No rash, xanthoma or xanthelasma.   Neuro: Alert and oriented x3. Grossly intact. Strength is symmetrical. Normal mood and affect.     ECG:nsr LVH, lat stt changes c/w LVH    LABS: none     IMPRESSION/RECOMMENDATIONS: Amber [name] is a 68 y.o. female who presents for follow up.   HTN - she had episodic HTN and went to [facility] for overnight r/o with Nl echo. She has had nausea with Norvasc but maybe related to previous GI bypass. I will try Cardizem 120 mg qam and toprol 50 mg qhs. I asked her to take BP at 10 am , 6 pm everyday and record for Dr Katrinka Blazing to f/u in 2 weeks. She does have white coat HTN and anxiety that drives up her BP as well. She has had death of her 57 yo son , trying to go to school  And personal issues at home which have contributed to her BP issues.         Fortino Sic, MD [facility]

## 2019-07-31 ENCOUNTER — Encounter (INDEPENDENT_AMBULATORY_CARE_PROVIDER_SITE_OTHER): Payer: Self-pay | Admitting: Internal Medicine

## 2019-08-10 ENCOUNTER — Encounter (INDEPENDENT_AMBULATORY_CARE_PROVIDER_SITE_OTHER): Payer: Self-pay

## 2019-08-10 ENCOUNTER — Encounter (INDEPENDENT_AMBULATORY_CARE_PROVIDER_SITE_OTHER): Payer: Self-pay | Admitting: Internal Medicine

## 2019-08-11 ENCOUNTER — Encounter (INDEPENDENT_AMBULATORY_CARE_PROVIDER_SITE_OTHER): Payer: Self-pay

## 2019-08-18 ENCOUNTER — Other Ambulatory Visit (INDEPENDENT_AMBULATORY_CARE_PROVIDER_SITE_OTHER): Payer: Self-pay | Admitting: Internal Medicine

## 2019-08-24 ENCOUNTER — Encounter (INDEPENDENT_AMBULATORY_CARE_PROVIDER_SITE_OTHER): Payer: Self-pay | Admitting: Cardiology

## 2019-08-24 ENCOUNTER — Ambulatory Visit (INDEPENDENT_AMBULATORY_CARE_PROVIDER_SITE_OTHER): Payer: Medicare Other | Admitting: Cardiology

## 2019-08-24 DIAGNOSIS — I10 Essential (primary) hypertension: Secondary | ICD-10-CM

## 2019-08-24 MED ORDER — METOPROLOL SUCCINATE ER 100 MG PO TB24
100.00 mg | ORAL_TABLET | Freq: Every day | ORAL | 3 refills | Status: DC
Start: 2019-08-24 — End: 2019-09-11

## 2019-08-24 NOTE — Progress Notes (Signed)
Amber [name] [phone].o. with a history of HTN presents for cardiac follow up    Since her gastric surgery, she has been intolerant to medications due to GI distress.    Hypertension:  Several ER visits due to hyper tensive episodes.  She was on atenolol in the past but this was changed to Toprol-XL 50 mg daily and Cardizem CD 120 mg daily.  She notes nausea after taking diltiazem.  She also notes recurrent palpitations.    Palpitations: She has had intermittent palpitations.  Palpitations have decreased with the addition of beta-blocker to her regimen.  No significant events on a recent 24-hour Holter monitor. Last year she did wear a 30-day event monitor that noted occasional PVCs.    Cerebrovascular disease: History of a TIA. She has had no recurrence. This TIA occurred when she was not on aspirin. Currently compliant with aspirin.    Cardiac work up has included  - stress test (11/06/17 - normal myocardial perfusion)  - cardiac cath ( 01/08/14 - normal )  - echocardiogram ( EF 55%, mild TR)  - 24 hour holter - no significant events          Past Medical History:   Diagnosis Date   . Asthma    . Bronchitis 01/02/2019   . Chest pain 2016     Chest pain after eating x6  months--being followed by GI for GERD   . Claustrophobia     MRIs   . Constipation    . Genital herpes     no current outbreak (10/03/15)   . GERD (gastroesophageal reflux disease)    . Hiatal hernia     h/o surgery   . Hyperlipidemia    . Hypertensive disorder     Well controlled on meds per pt. Elevated with pain.   . Low back pain    . Nausea without vomiting    . OSA on CPAP 2015    CPAP nightly x 1 yr, used nightly.   . Osteoarthritis of knees, bilateral     and back   . SBO (small bowel obstruction) 09/2018   . TIA (transient ischemic attack) 2014    TIA 2014-no residual. Patient evaulated for possible TIA 07/12/2015  @ [facility] (dischage summary in epic)-per summary CT of head was  normal and patient was d/c'd     Family History   Problem Relation Age of Onset   . Cancer Mother 21        breast cancer   . Breast cancer Mother    . Heart disease Father    . Malignant hyperthermia Neg Hx    . Pseudochol deficiency Neg Hx      Current Outpatient Medications   Medication Sig Dispense Refill   . alendronate (FOSAMAX) 70 MG tablet TAKE 1 TABLET BY MOUTH EVERY WEEK AS DIRECTED 12 tablet 1   . aspirin EC 81 MG EC tablet Take 1 tablet (81 mg total) by mouth daily. 30 tablet 1   . clotrimazole-betamethasone (LOTRISONE) cream Apply to affected area 2 times daily 45 g 1   . Cyanocobalamin (B-12 IJ) Inject as directed once a week     . fluticasone (FLONASE) 50 MCG/ACT nasal spray 2 sprays by Nasal route daily. (Patient taking differently: 2 sprays by Nasal route as needed.   ) 16 g 2   . metoprolol succinate XL (TOPROL-XL) 100 MG 24 hr tablet Take 1 tablet (100 mg total) by mouth Daily after dinner 90 tablet 3   .  Multiple Vitamin (MULTIVITAMIN) capsule Take 1 capsule by mouth daily     . ondansetron (ZOFRAN-ODT) 4 MG disintegrating tablet Take 4 mg by mouth every 8 (eight) hours as needed for Nausea     . pantoprazole (PROTONIX) 40 MG tablet TAKE 1 TABLET(40 MG) BY MOUTH DAILY 90 tablet 0   . valACYclovir HCL (VALTREX) 500 MG tablet TAKE 1 TABLET(500 MG) BY MOUTH DAILY (Patient taking differently: Take 500 mg by mouth as needed TAKE 1 TABLET(500 MG) BY MOUTH DAILY  ) 90 tablet 1     No current facility-administered medications for this visit.             PE:    Vitals:    08/24/19 1052   BP: (!) 165/96   Pulse: 61   Temp: (!) 96.5 F (35.8 C)     Body mass index is 26.83 kg/m.    Physical Examination: General appearance - alert, well appearing, and in no distress  Chest - clear to auscultation, no wheezes, rales or rhonchi, symmetric air entry  Heart - normal rate and regular rhythm  Abdomen - soft, nontender, nondistended, no masses or organomegaly  Extremities - no pedal edema noted      Labs:  Lipid  Panel   Cholesterol   Date/Time Value Ref Range Status   08/16/2018 07:34 AM 187 <200 mg/dL Final     Triglycerides   Date/Time Value Ref Range Status   08/16/2018 07:34 AM 61 <150 mg/dL Final   16/08/9603 54:09 AM 26 (L) 34 - 149 mg/dL Final     HDL   Date/Time Value Ref Range Status   08/16/2018 07:34 AM 80 >50 mg/dL Final       CMP:   Sodium   Date/Time Value Ref Range Status   05/23/2019 08:18 AM 142 136 - 145 mEq/L Final     Potassium   Date/Time Value Ref Range Status   05/23/2019 08:18 AM 3.9 3.5 - 5.1 mEq/L Final     Chloride   Date/Time Value Ref Range Status   05/23/2019 08:18 AM 106 100 - 111 mEq/L Final   08/16/2018 07:34 AM 107 98 - 110 mmol/L Final     CO2   Date/Time Value Ref Range Status   05/23/2019 08:18 AM 26 21 - 29 mEq/L Final     Glucose   Date/Time Value Ref Range Status   05/23/2019 08:18 AM 87 70 - 100 mg/dL Final     Comment:     ADA guidelines for diabetes mellitus:  Fasting:  Equal to or greater than 126 mg/dL  Random:   Equal to or greater than 200 mg/dL       BUN   Date/Time Value Ref Range Status   05/23/2019 08:18 AM 13.0 7.0 - 19.0 mg/dL Final     Protein, Total   Date/Time Value Ref Range Status   05/23/2019 08:18 AM 6.9 6.0 - 8.3 g/dL Final   81/19/1478 29:56 AM 6.5 6.1 - 8.1 g/dL Final     Alkaline Phosphatase   Date/Time Value Ref Range Status   05/23/2019 08:18 AM 85 37 - 106 U/L Final     AST (SGOT)   Date/Time Value Ref Range Status   05/23/2019 08:18 AM 20 5 - 34 U/L Final     ALT   Date/Time Value Ref Range Status   05/23/2019 08:18 AM 14 0 - 55 U/L Final     Anion Gap   Date/Time Value Ref Range Status  05/23/2019 08:18 AM 10.0 5.0 - 15.0 Final     Comment:     Calculated AGAP = Na - (CL + CO2)  Interpret with caution; calculated AGAP may not reflect patient's  true clinical status.         CBC:   WBC   Date/Time Value Ref Range Status   03/29/2019 11:10 AM 5.11 3.10 - 9.50 x10 3/uL Final   06/30/2009 04:03 PM 9.12 3.50 - 10.80 /CUMM Final     RBC   Date/Time Value Ref  Range Status   03/29/2019 11:10 AM 4.51 3.90 - 5.10 x10 6/uL Final     Hemoglobin   Date/Time Value Ref Range Status   08/16/2018 07:34 AM 12.3 11.7 - 15.5 g/dL Final     Hgb   Date/Time Value Ref Range Status   03/29/2019 11:10 AM 12.2 11.4 - 14.8 g/dL Final     Hematocrit   Date/Time Value Ref Range Status   03/29/2019 11:10 AM 40.5 34.7 - 43.7 % Final     MCV   Date/Time Value Ref Range Status   03/29/2019 11:10 AM 89.8 78.0 - 96.0 fL Final     MCHC   Date/Time Value Ref Range Status   03/29/2019 11:10 AM 30.1 (L) 31.5 - 35.8 g/dL Final     RDW   Date/Time Value Ref Range Status   03/29/2019 11:10 AM 16 (H) 11 - 15 % Final     Platelets   Date/Time Value Ref Range Status   03/29/2019 11:10 AM 258 142 - 346 x10 3/uL Final   08/16/2018 07:34 AM 252 140 - 400 Thousand/uL Final           Impression / plan    Hypertension: Improved control.  She is difficulty taking medications since her gastric bypass.  Will discontinue diltiazem.  Will increase Toprol-XL 100 mg nightly.    Palpitations: Continue to follow.  Continue with increased dose of beta-blocker.    Follow-up in 4 weeks      Tenessa Marsee Dan Humphreys  08/24/2019

## 2019-09-07 ENCOUNTER — Encounter (INDEPENDENT_AMBULATORY_CARE_PROVIDER_SITE_OTHER): Payer: Self-pay | Admitting: Surgery

## 2019-09-07 ENCOUNTER — Ambulatory Visit (INDEPENDENT_AMBULATORY_CARE_PROVIDER_SITE_OTHER): Payer: Medicare Other | Admitting: Surgery

## 2019-09-07 ENCOUNTER — Encounter (INDEPENDENT_AMBULATORY_CARE_PROVIDER_SITE_OTHER): Payer: Self-pay

## 2019-09-07 ENCOUNTER — Other Ambulatory Visit (INDEPENDENT_AMBULATORY_CARE_PROVIDER_SITE_OTHER): Payer: Self-pay

## 2019-09-07 VITALS — Temp 97.3°F | Ht 61.0 in | Wt 143.0 lb

## 2019-09-07 DIAGNOSIS — Z9884 Bariatric surgery status: Secondary | ICD-10-CM

## 2019-09-07 DIAGNOSIS — M170 Bilateral primary osteoarthritis of knee: Secondary | ICD-10-CM

## 2019-09-07 DIAGNOSIS — K219 Gastro-esophageal reflux disease without esophagitis: Secondary | ICD-10-CM

## 2019-09-07 DIAGNOSIS — Z1321 Encounter for screening for nutritional disorder: Secondary | ICD-10-CM

## 2019-09-07 DIAGNOSIS — D352 Benign neoplasm of pituitary gland: Secondary | ICD-10-CM

## 2019-09-07 DIAGNOSIS — K911 Postgastric surgery syndromes: Secondary | ICD-10-CM

## 2019-09-07 DIAGNOSIS — R002 Palpitations: Secondary | ICD-10-CM

## 2019-09-07 DIAGNOSIS — K449 Diaphragmatic hernia without obstruction or gangrene: Secondary | ICD-10-CM

## 2019-09-07 DIAGNOSIS — I10 Essential (primary) hypertension: Secondary | ICD-10-CM

## 2019-09-07 DIAGNOSIS — G459 Transient cerebral ischemic attack, unspecified: Secondary | ICD-10-CM

## 2019-09-07 MED ORDER — DILTIAZEM HCL ER COATED BEADS 120 MG PO CP24
120.00 mg | ORAL_CAPSULE | Freq: Every day | ORAL | 2 refills | Status: DC
Start: 2019-09-07 — End: 2019-09-11

## 2019-09-07 NOTE — Progress Notes (Signed)
Patient called and stated that she has been having c/o nausea and left leg numbness since having her Metoprolol increased to 100 mg. Dr. Katrinka Blazing notified and decreased the Metoprolol back to 50 mg and is having patient resume Cardizem CD 120 mg.

## 2019-09-07 NOTE — Progress Notes (Signed)
ASSESSMENT:  Bariatric surgery status  Dumping syndrome      PLAN:  Discussed about diet choices  Will make appointment with dietician  Will need full set of labs.  Follow up in 4 weeks      SUBJECTIVE:  The patient is a 68 y.o.  female who presents s/p gastric bypass 4 years ago with nausea, vomiting, heart racing, abdominal pain and diarrhea after eating.  Patient says it happened this morning after eating cereal.  Patient unable to pinpoint what foods make her have the symptoms.        The following portions of the patient's history were reviewed and updated as appropriate: allergies, current medications, past family history, past medical history, past social history, past surgical history and problem list.    CURRENT PROBLEM LIST:   Patient Active Problem List   Diagnosis   . Essential hypertension   . Anxiety state, unspecified   . Abnormal electrocardiogram   . Cerebral infarction   . Gastroesophageal reflux disease   . Hypernatremia   . Paresthesia   . Slow transit constipation   . Vitamin D deficiency   . Overactive bladder   . S/P laparoscopic cholecystectomy   . Transient cerebral ischemia, unspecified type   . Palpitations   . Pituitary microadenoma   . Dyslipidemia   . Other long term (current) drug therapy   . Headache   . History of spinal surgery   . Obstructive sleep apnea syndrome   . Hiatal hernia   . Osteoarthritis of knees, bilateral   . Bariatric surgery status   . Nutritional deficiency   . Anxiety   . Epigastric abdominal tenderness without rebound tenderness   . Encounter for vitamin deficiency screening   . Nausea   . Other dysphagia   . Small bowel obstruction due to adhesions   . Allergic rhinitis   . Chronic frontal sinusitis   . Chronic sphenoidal sinusitis   . Sinusitis   . Primary osteoarthritis of both knees   . Labile hypertension   . Postsurgical dumping syndrome     PAST MEDICAL HISTORY:   Past Medical History:   Diagnosis Date   . Asthma    . Bronchitis 01/02/2019   . Chest pain  2016     Chest pain after eating x6  months--being followed by GI for GERD   . Claustrophobia     MRIs   . Constipation    . Genital herpes     no current outbreak (10/03/15)   . GERD (gastroesophageal reflux disease)    . Hiatal hernia     h/o surgery   . Hyperlipidemia    . Hypertensive disorder     Well controlled on meds per pt. Elevated with pain.   . Low back pain    . Nausea without vomiting    . OSA on CPAP 2015    CPAP nightly x 1 yr, used nightly.   . Osteoarthritis of knees, bilateral     and back   . SBO (small bowel obstruction) 09/2018   . TIA (transient ischemic attack) 2014    TIA 2014-no residual. Patient evaulated for possible TIA 07/12/2015  @ [facility] (dischage summary in epic)-per summary CT of head was normal and patient was d/c'd     PAST SURGICAL HISTORY:   Past Surgical History:   Procedure Laterality Date   . APPENDECTOMY  >25 yrs   . CHOLECYSTECTOMY     . EGD  01/2015   .  EGD N/A 01/23/2019    Procedure: EGD;  Surgeon: Champ Mungo, MD;  Location: [facility];  Service: General;  Laterality: N/A;  ENDOSCOPY  MD REQ=30MINS; Q1=UNK   . EGD, BIOPSY N/A 09/15/2017    Procedure: EGD, BIOPSY;  Surgeon: Pershing Proud, MD;  Location: [facility];  Service: Gastroenterology;  Laterality: N/A;   . fallopian tube surgery  >25 yrs   . INSERTION, PAIN PUMP (MEDICAL) N/A 10/22/2015    Procedure: INSERTION, PAIN PUMP (MEDICAL);  Surgeon: Nicola Police, DO;  Location: [facility];  Service: General;  Laterality: N/A;   . LAPAROSCOPIC, CHOLECYSTECTOMY, CHOLANGIOGRAM  08/13/2014   . LAPAROSCOPIC, GASTRIC BYPASS N/A 10/22/2015    Procedure: LAPAROSCOPIC, GASTRIC BYPASS;  Surgeon: Jeanell Sparrow, Cleland Simkins R, DO;  Location: [facility];  Service: General;  Laterality: N/A;   . LAPAROSCOPIC, HERNIORRHAPHY, HIATAL N/A 10/22/2015    Procedure: LAPAROSCOPIC, HERNIORRHAPHY, HIATAL;  Surgeon: Nicola Police, DO;  Location: [place] [facility];  Service: General;  Laterality: N/A;   . LAPAROSCOPIC,  LYSIS, ADHESIONS N/A 09/01/2018    Procedure: LAPAROSCOPIC, LYSIS, ADHESIONS RELEASE OF  SMALL BOWEL OBSTRUCTION;  Surgeon: Champ Mungo, MD;  Location: [place] [facility];  Service: General;  Laterality: N/A;   . LAPAROSCOPY, DIAGNOSTIC N/A 09/01/2018    Procedure: LAPAROSCOPY, DIAGNOSTIC;  Surgeon: Champ Mungo, MD;  Location: [place] [facility];  Service: General;  Laterality: N/A;   . OVARY SURGERY Right >25 yrs   . SPINE SURGERY  11/2013    cervical HNP x 2, Dr. Bufford Buttner     TOBACCO HISTORY:   Social History     Tobacco Use   Smoking Status Never Smoker   Smokeless Tobacco Never Used     ALCOHOL HISTORY:   Social History     Substance and Sexual Activity   Alcohol Use Never   . Frequency: Never     DRUG HISTORY:   Social History     Substance and Sexual Activity   Drug Use Never     CURRENT OUTPATIENT MEDICATIONS:   Outpatient Medications Marked as Taking for the 09/07/19 encounter (Office Visit) with Michalene Debruler R, DO   Medication Sig Dispense Refill   . alendronate (FOSAMAX) 70 MG tablet TAKE 1 TABLET BY MOUTH EVERY WEEK AS DIRECTED 12 tablet 1   . aspirin EC 81 MG EC tablet Take 1 tablet (81 mg total) by mouth daily. 30 tablet 1   . clotrimazole-betamethasone (LOTRISONE) cream Apply to affected area 2 times daily 45 g 1   . Cyanocobalamin (B-12 IJ) Inject as directed once a week     . fluticasone (FLONASE) 50 MCG/ACT nasal spray 2 sprays by Nasal route daily. (Patient taking differently: 2 sprays by Nasal route as needed.   ) 16 g 2   . metoprolol succinate XL (TOPROL-XL) 100 MG 24 hr tablet Take 1 tablet (100 mg total) by mouth Daily after dinner 90 tablet 3   . Multiple Vitamin (MULTIVITAMIN) capsule Take 1 capsule by mouth daily     . ondansetron (ZOFRAN-ODT) 4 MG disintegrating tablet Take 4 mg by mouth every 8 (eight) hours as needed for Nausea     . pantoprazole (PROTONIX) 40 MG tablet TAKE 1 TABLET(40 MG) BY MOUTH DAILY 90 tablet 0   . valACYclovir HCL (VALTREX) 500 MG tablet TAKE 1 TABLET(500 MG) BY  MOUTH DAILY (Patient taking differently: Take 500 mg by mouth as needed TAKE 1 TABLET(500 MG) BY MOUTH DAILY  )  90 tablet 1     ALLERGIES:   Allergies   Allergen Reactions   . Acyclovir Nausea And Vomiting     Other reaction(s): gi distress   . Amlodipine      nausea   . Amoxicillin-Pot Clavulanate      Other reaction(s): gi distress     . Aspirin Nausea And Vomiting     Patient states "it upsets my stomach"  Other reaction(s): gi distress  Upsets stomach  Able to tolerate enteric coated aspirin   . Atorvastatin        Palpitation   . Carvedilol      Extreme fatigue   . Lisinopril      nausea   . Lovastatin Nausea And Vomiting     Other reaction(s): gi distress   . Metronidazole Nausea And Vomiting     Other reaction(s): gi distress   . Moxifloxacin Nausea And Vomiting         GI symptoms   . Rosuvastatin      Palpitation, chest pain, abd pain    . Statins      Chest pain, palpitations.   . Sulfa Antibiotics Nausea And Vomiting     Other reaction(s): gi distress       OBJECTIVE:  Temp 97.3 F (36.3 C) (Temporal)   Ht 5\' 1"    Wt 143 lb   BMI 27.02 kg/m     General appearance: alert, appears stated age and cooperative  Head: Normocephalic, without obvious abnormality, atraumatic  Abdomen: Minimal epigastric tenderness without guarding.  Abdomen is soft, nondistended, positive bowel sounds      No results found.    This note was generated by the [facility] EMR system/Dragon speech recognition and may contain inherent errors or omissions not intended by the user. Grammatical errors, random word insertions, deletions, pronoun errors and incomplete sentences are occasional consequences of this technology due to software limitations. Not all errors are caught or corrected. If there are questions or concerns about the content of this note or information contained within the body of this dictation they should be addressed directly with the author for clarification.      Kaelen Caughlin R. Dandrea Widdowson, DO, FACS, FASMBS, FACOS

## 2019-09-10 ENCOUNTER — Encounter (INDEPENDENT_AMBULATORY_CARE_PROVIDER_SITE_OTHER): Payer: Self-pay

## 2019-09-11 ENCOUNTER — Encounter (INDEPENDENT_AMBULATORY_CARE_PROVIDER_SITE_OTHER): Payer: Self-pay

## 2019-09-11 ENCOUNTER — Other Ambulatory Visit (INDEPENDENT_AMBULATORY_CARE_PROVIDER_SITE_OTHER): Payer: Self-pay | Admitting: Cardiology

## 2019-09-11 DIAGNOSIS — I10 Essential (primary) hypertension: Secondary | ICD-10-CM

## 2019-09-11 MED ORDER — ATENOLOL 50 MG PO TABS
50.0000 mg | ORAL_TABLET | Freq: Every day | ORAL | 3 refills | Status: DC
Start: 2019-09-11 — End: 2020-05-09

## 2019-09-21 ENCOUNTER — Encounter (INDEPENDENT_AMBULATORY_CARE_PROVIDER_SITE_OTHER): Payer: Self-pay | Admitting: Cardiology

## 2019-09-21 ENCOUNTER — Ambulatory Visit (INDEPENDENT_AMBULATORY_CARE_PROVIDER_SITE_OTHER): Payer: Medicare Other | Admitting: Cardiology

## 2019-09-21 VITALS — BP 173/73 | HR 64 | Temp 96.4°F | Wt 142.8 lb

## 2019-09-21 DIAGNOSIS — I10 Essential (primary) hypertension: Secondary | ICD-10-CM

## 2019-09-21 DIAGNOSIS — R0989 Other specified symptoms and signs involving the circulatory and respiratory systems: Secondary | ICD-10-CM

## 2019-09-21 NOTE — Progress Notes (Signed)
Amber [name] 68 y.o. with a history of HTN presents for cardiac follow up    Since her gastric surgery, she has been intolerant to medications due to GI distress.    Hypertension:  Difficulty tolerating Cardizem and Toprol.  She was placed on atenolol 50 mg daily with out GI distress.  Her blood pressures been controlled at home.  She still gets occasional palpitations    Palpitations: She has had intermittent palpitations.  Palpitations have decreased with the addition of beta-blocker to her regimen.  No significant events on a recent 24-hour Holter monitor. Last year she did wear a 30-day event monitor that noted occasional PVCs.    Cerebrovascular disease: History of a TIA. She has had no recurrence. This TIA occurred when she was not on aspirin. Currently compliant with aspirin.    Cardiac work up has included  - stress test (11/06/17 - normal myocardial perfusion)  - cardiac cath ( 01/08/14 - normal )  - echocardiogram ( EF 55%, mild TR)  - 24 hour holter - no significant events        Past Medical History:   Diagnosis Date   . Asthma    . Bronchitis 01/02/2019   . Chest pain 2016     Chest pain after eating x6  months--being followed by GI for GERD   . Claustrophobia     MRIs   . Constipation    . Genital herpes     no current outbreak (10/03/15)   . GERD (gastroesophageal reflux disease)    . Hiatal hernia     h/o surgery   . Hyperlipidemia    . Hypertensive disorder     Well controlled on meds per pt. Elevated with pain.   . Low back pain    . Nausea without vomiting    . OSA on CPAP 2015    CPAP nightly x 1 yr, used nightly.   . Osteoarthritis of knees, bilateral     and back   . SBO (small bowel obstruction) 09/2018   . TIA (transient ischemic attack) 2014    TIA 2014-no residual. Patient evaulated for possible TIA 07/12/2015  @ [facility] (dischage summary in epic)-per summary CT of head was normal and patient was d/c'd     Family History    Problem Relation Age of Onset   . Cancer Mother 49        breast cancer   . Breast cancer Mother    . Heart disease Father    . Malignant hyperthermia Neg Hx    . Pseudochol deficiency Neg Hx      Current Outpatient Medications   Medication Sig Dispense Refill   . alendronate (FOSAMAX) 70 MG tablet TAKE 1 TABLET BY MOUTH EVERY WEEK AS DIRECTED 12 tablet 1   . aspirin EC 81 MG EC tablet Take 1 tablet (81 mg total) by mouth daily. 30 tablet 1   . atenolol (Tenormin) 50 MG tablet Take 1 tablet (50 mg total) by mouth daily 90 tablet 3   . clotrimazole-betamethasone (LOTRISONE) cream Apply to affected area 2 times daily 45 g 1   . Cyanocobalamin (B-12 IJ) Inject as directed once a week     . fluticasone (FLONASE) 50 MCG/ACT nasal spray 2 sprays by Nasal route daily. (Patient taking differently: 2 sprays by Nasal route as needed.   ) 16 g 2   . Multiple Vitamin (MULTIVITAMIN) capsule Take 1 capsule by mouth daily     . ondansetron (  ZOFRAN-ODT) 4 MG disintegrating tablet Take 4 mg by mouth every 8 (eight) hours as needed for Nausea     . pantoprazole (PROTONIX) 40 MG tablet TAKE 1 TABLET(40 MG) BY MOUTH DAILY 90 tablet 0   . valACYclovir HCL (VALTREX) 500 MG tablet TAKE 1 TABLET(500 MG) BY MOUTH DAILY (Patient taking differently: Take 500 mg by mouth as needed TAKE 1 TABLET(500 MG) BY MOUTH DAILY  ) 90 tablet 1     No current facility-administered medications for this visit.         PE:    Vitals:    09/21/19 1011   BP: 173/73   Pulse: 64   Temp: (!) 96.4 F (35.8 C)     Body mass index is 26.98 kg/m.    Physical Examination: General appearance - alert, well appearing, and in no distress  Mental status - alert, oriented to person, place, and time  Chest - clear to auscultation, no wheezes, rales or rhonchi, symmetric air entry  Heart - normal rate and regular rhythm  Abdomen - soft, nontender, nondistended, no masses or organomegaly  Extremities - no pedal edema noted      Labs:  Lipid Panel   Cholesterol   Date/Time  Value Ref Range Status   08/16/2018 07:34 AM 187 <200 mg/dL Final     Triglycerides   Date/Time Value Ref Range Status   08/16/2018 07:34 AM 61 <150 mg/dL Final   16/08/9603 54:09 AM 26 (L) 34 - 149 mg/dL Final     HDL   Date/Time Value Ref Range Status   08/16/2018 07:34 AM 80 >50 mg/dL Final       CMP:   Sodium   Date/Time Value Ref Range Status   05/23/2019 08:18 AM 142 136 - 145 mEq/L Final     Potassium   Date/Time Value Ref Range Status   05/23/2019 08:18 AM 3.9 3.5 - 5.1 mEq/L Final     Chloride   Date/Time Value Ref Range Status   05/23/2019 08:18 AM 106 100 - 111 mEq/L Final   08/16/2018 07:34 AM 107 98 - 110 mmol/L Final     CO2   Date/Time Value Ref Range Status   05/23/2019 08:18 AM 26 21 - 29 mEq/L Final     Glucose   Date/Time Value Ref Range Status   05/23/2019 08:18 AM 87 70 - 100 mg/dL Final     Comment:     ADA guidelines for diabetes mellitus:  Fasting:  Equal to or greater than 126 mg/dL  Random:   Equal to or greater than 200 mg/dL       BUN   Date/Time Value Ref Range Status   05/23/2019 08:18 AM 13.0 7.0 - 19.0 mg/dL Final     Protein, Total   Date/Time Value Ref Range Status   05/23/2019 08:18 AM 6.9 6.0 - 8.3 g/dL Final   81/19/1478 29:56 AM 6.5 6.1 - 8.1 g/dL Final     Alkaline Phosphatase   Date/Time Value Ref Range Status   05/23/2019 08:18 AM 85 37 - 106 U/L Final     AST (SGOT)   Date/Time Value Ref Range Status   05/23/2019 08:18 AM 20 5 - 34 U/L Final     ALT   Date/Time Value Ref Range Status   05/23/2019 08:18 AM 14 0 - 55 U/L Final     Anion Gap   Date/Time Value Ref Range Status   05/23/2019 08:18 AM 10.0 5.0 - 15.0 Final  Comment:     Calculated AGAP = Na - (CL + CO2)  Interpret with caution; calculated AGAP may not reflect patient's  true clinical status.         CBC:   WBC   Date/Time Value Ref Range Status   03/29/2019 11:10 AM 5.11 3.10 - 9.50 x10 3/uL Final   06/30/2009 04:03 PM 9.12 3.50 - 10.80 /CUMM Final     RBC   Date/Time Value Ref Range Status   03/29/2019 11:10 AM  4.51 3.90 - 5.10 x10 6/uL Final     Hemoglobin   Date/Time Value Ref Range Status   08/16/2018 07:34 AM 12.3 11.7 - 15.5 g/dL Final     Hgb   Date/Time Value Ref Range Status   03/29/2019 11:10 AM 12.2 11.4 - 14.8 g/dL Final     Hematocrit   Date/Time Value Ref Range Status   03/29/2019 11:10 AM 40.5 34.7 - 43.7 % Final     MCV   Date/Time Value Ref Range Status   03/29/2019 11:10 AM 89.8 78.0 - 96.0 fL Final     MCHC   Date/Time Value Ref Range Status   03/29/2019 11:10 AM 30.1 (L) 31.5 - 35.8 g/dL Final     RDW   Date/Time Value Ref Range Status   03/29/2019 11:10 AM 16 (H) 11 - 15 % Final     Platelets   Date/Time Value Ref Range Status   03/29/2019 11:10 AM 258 142 - 346 x10 3/uL Final   08/16/2018 07:34 AM 252 140 - 400 Thousand/uL Final           Impression / plan    Hypertension: Blood pressure well controlled.  Continue atenolol 50 mg daily.    Palpitations: History of PVCs with preserved LV function and angiographic normal coronary arteries.  Continue atenolol 50 mg daily.    Follow-up in 6 months      Amber [name]  09/21/2019

## 2019-09-25 ENCOUNTER — Encounter (INDEPENDENT_AMBULATORY_CARE_PROVIDER_SITE_OTHER): Payer: Self-pay

## 2019-09-26 ENCOUNTER — Encounter (INDEPENDENT_AMBULATORY_CARE_PROVIDER_SITE_OTHER): Payer: Self-pay

## 2019-09-26 ENCOUNTER — Telehealth (INDEPENDENT_AMBULATORY_CARE_PROVIDER_SITE_OTHER): Payer: Medicare Other | Admitting: Registered"

## 2019-09-26 DIAGNOSIS — Z9884 Bariatric surgery status: Secondary | ICD-10-CM

## 2019-09-26 DIAGNOSIS — Z713 Dietary counseling and surveillance: Secondary | ICD-10-CM

## 2019-09-26 DIAGNOSIS — Z719 Counseling, unspecified: Secondary | ICD-10-CM

## 2019-09-26 DIAGNOSIS — Z09 Encounter for follow-up examination after completed treatment for conditions other than malignant neoplasm: Secondary | ICD-10-CM

## 2019-09-26 NOTE — Progress Notes (Signed)
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  Pt has lost 68 pounds at this time and reports being very happy with weight loss.  Pt states consuming about 68 per meal.  Pt continues with all recommended vitamin/mineral supplements at this time.  Pt also reports consuming 30-60 grams protein daily from a supplemental source.  For physical activity, pt reports participating in:  Walking occ - but has significant knee pain.  Currently, pt is able to consume about 64 oz clear fluids daily.  Pt reports continued nausea - but not vomiting.  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  Pt reports having a lot of stomach pain when she drinks and eats - epigastric pain.  The pain lasts few 30 mins or so.  This happens several times a week.    O:  Today's Wt:    142#    Previous Visit Wt:    Wt Readings from Last 10 Encounters:   09/21/19 142 lb 12.8 oz   09/07/19 143 lb   08/24/19 142 lb   07/27/19 142 lb 12.8 oz   07/24/19 145 lb 9.6 oz   05/22/19 135 lb   04/18/19 140 lb   04/07/19 142 lb   03/29/19 142 lb 3.2 oz   02/21/19 136 lb 3.2 oz     BMI:  There is no height or weight on file to calculate BMI.  Total Wt Loss:  68#  Surgery Date:  10/2015    Allergies:    Allergies   Allergen Reactions   . Acyclovir Nausea And Vomiting     Other reaction(s): gi distress   . Amlodipine      nausea   . Amoxicillin-Pot Clavulanate      Other reaction(s): gi distress     . Aspirin Nausea And Vomiting     Patient states "it upsets my stomach"  Other reaction(s): gi distress  Upsets stomach  Able to tolerate enteric coated aspirin   . Atorvastatin        Palpitation   . Carvedilol      Extreme fatigue   . Lisinopril      nausea   . Lovastatin Nausea And Vomiting     Other reaction(s): gi distress   . Metronidazole Nausea And Vomiting     Other reaction(s): gi distress   . Moxifloxacin Nausea And  Vomiting         GI symptoms   . Rosuvastatin      Palpitation, chest pain, abd pain    . Statins      Chest pain, palpitations.   . Sulfa Antibiotics Nausea And Vomiting     Other reaction(s): gi distress       Diet Recall:   Breakfast:  Cheerios w canned peaches and milk OR plain oatmeal w banana OR sausage sandwich   Lunch:  [place] sandwich w 2 slices of tomato and fruit   Dinner:  Chicken soup OR [place] sandwich w lettuce and tomato    Snacks:  Protein shakes, peaches   Beverages:  Water, crystal light   Alcohol:  Small glass of wine about once a week    A:  Per diet recall, pt consumes appropriate portion sizes daily.  Food choices are acceptable and appropriate - most are high in lean protein and produce.  HOwever, pt seems sensitive  to higher carb meals and sugar - more so than typical bypass patient.  Pt may see improvement with altered diet.  However, she still has a lot of epi gastric pain and should be evaluated again by MD.  Pt appears motivated to continue monitoring calories, exercising regularly and following recommended dietary guidelines to continue and then maintain weight loss.    P:  1.  Pt to maintain portion sizes to 8 oz per meal.  Pt encouraged to measure all her portions.    2.  Pt to continue using protein rich foods OR supplements for snacks at this time.  3.  Pt to avoid all slider foods like oatmeal - gave pt alternatives meal choices.  4.  Pt to keep food/symptom journal for 2 weeks and report to RD.  Marland Kitchen  Pt to f/u in 4-6 weeks or PRN.  Pt provided contact information for any questions or concerns.    Spent a total of 15 minutes educating pt in a individual one-on-one setting.  Plan reviewed with surgeon.

## 2019-10-04 ENCOUNTER — Other Ambulatory Visit (FREE_STANDING_LABORATORY_FACILITY): Payer: Medicare Other

## 2019-10-04 ENCOUNTER — Encounter (INDEPENDENT_AMBULATORY_CARE_PROVIDER_SITE_OTHER): Payer: Self-pay | Admitting: Internal Medicine

## 2019-10-04 ENCOUNTER — Ambulatory Visit (INDEPENDENT_AMBULATORY_CARE_PROVIDER_SITE_OTHER): Payer: Medicare Other | Admitting: Internal Medicine

## 2019-10-04 VITALS — BP 161/93 | HR 68 | Temp 97.9°F | Ht 61.0 in | Wt 143.0 lb

## 2019-10-04 DIAGNOSIS — M170 Bilateral primary osteoarthritis of knee: Secondary | ICD-10-CM

## 2019-10-04 DIAGNOSIS — Z1321 Encounter for screening for nutritional disorder: Secondary | ICD-10-CM

## 2019-10-04 DIAGNOSIS — K449 Diaphragmatic hernia without obstruction or gangrene: Secondary | ICD-10-CM

## 2019-10-04 DIAGNOSIS — F419 Anxiety disorder, unspecified: Secondary | ICD-10-CM

## 2019-10-04 DIAGNOSIS — K219 Gastro-esophageal reflux disease without esophagitis: Secondary | ICD-10-CM

## 2019-10-04 DIAGNOSIS — Z9884 Bariatric surgery status: Secondary | ICD-10-CM

## 2019-10-04 DIAGNOSIS — G629 Polyneuropathy, unspecified: Secondary | ICD-10-CM

## 2019-10-04 DIAGNOSIS — R002 Palpitations: Secondary | ICD-10-CM

## 2019-10-04 DIAGNOSIS — G459 Transient cerebral ischemic attack, unspecified: Secondary | ICD-10-CM

## 2019-10-04 DIAGNOSIS — D352 Benign neoplasm of pituitary gland: Secondary | ICD-10-CM

## 2019-10-04 DIAGNOSIS — I10 Essential (primary) hypertension: Secondary | ICD-10-CM

## 2019-10-04 DIAGNOSIS — K911 Postgastric surgery syndromes: Secondary | ICD-10-CM

## 2019-10-04 LAB — TSH: TSH: 1.09 u[IU]/mL (ref 0.35–4.94)

## 2019-10-04 LAB — COMPREHENSIVE METABOLIC PANEL
ALT: 14 U/L (ref 0–55)
AST (SGOT): 20 U/L (ref 5–34)
Albumin/Globulin Ratio: 1.6 (ref 0.9–2.2)
Albumin: 4.2 g/dL (ref 3.5–5.0)
Alkaline Phosphatase: 71 U/L (ref 37–106)
Anion Gap: 11 (ref 5.0–15.0)
BUN: 13 mg/dL (ref 7.0–19.0)
Bilirubin, Total: 0.5 mg/dL (ref 0.2–1.2)
CO2: 23 mEq/L (ref 21–29)
Calcium: 9.1 mg/dL (ref 8.5–10.5)
Chloride: 109 mEq/L (ref 100–111)
Creatinine: 0.7 mg/dL (ref 0.4–1.5)
Globulin: 2.6 g/dL (ref 2.0–3.7)
Glucose: 86 mg/dL (ref 70–100)
Potassium: 3.9 mEq/L (ref 3.5–5.1)
Protein, Total: 6.8 g/dL (ref 6.0–8.3)
Sodium: 143 mEq/L (ref 136–145)

## 2019-10-04 LAB — CBC
Absolute NRBC: 0 10*3/uL (ref 0.00–0.00)
Hematocrit: 42.9 % (ref 34.7–43.7)
Hgb: 13.5 g/dL (ref 11.4–14.8)
MCH: 28.4 pg (ref 25.1–33.5)
MCHC: 31.5 g/dL (ref 31.5–35.8)
MCV: 90.1 fL (ref 78.0–96.0)
MPV: 10.8 fL (ref 8.9–12.5)
Nucleated RBC: 0 /100 WBC (ref 0.0–0.0)
Platelets: 245 10*3/uL (ref 142–346)
RBC: 4.76 10*6/uL (ref 3.90–5.10)
RDW: 17 % — ABNORMAL HIGH (ref 11–15)
WBC: 3.65 10*3/uL (ref 3.10–9.50)

## 2019-10-04 LAB — LIPID PANEL
Cholesterol / HDL Ratio: 2.7
Cholesterol: 196 mg/dL (ref 0–199)
HDL: 73 mg/dL (ref 40–9999)
LDL Calculated: 115 mg/dL — ABNORMAL HIGH (ref 0–99)
Triglycerides: 40 mg/dL (ref 34–149)
VLDL Calculated: 8 mg/dL — ABNORMAL LOW (ref 10–40)

## 2019-10-04 LAB — IRON PROFILE
Iron Saturation: 39 % (ref 15–50)
Iron: 116 ug/dL (ref 40–145)
TIBC: 297 ug/dL (ref 265–497)
UIBC: 181 ug/dL (ref 126–382)

## 2019-10-04 LAB — HEMOLYSIS INDEX: Hemolysis Index: 27 — ABNORMAL HIGH (ref 0–18)

## 2019-10-04 LAB — GFR: EGFR: >60

## 2019-10-04 LAB — VITAMIN B12: Vitamin B-12: 529 pg/mL (ref 211–911)

## 2019-10-04 LAB — C-REACTIVE PROTEIN: C-Reactive Protein: <0.1 mg/dL (ref 0.0–0.8)

## 2019-10-04 LAB — FOLATE: Folate: 9.9 ng/mL

## 2019-10-04 LAB — PTH, INTACT: PTH Intact: 68.6 pg/mL (ref 9.0–72.0)

## 2019-10-04 LAB — SEDIMENTATION RATE: Sed Rate: 9 mm/Hr (ref 0–20)

## 2019-10-04 LAB — VITAMIN D,25 OH,TOTAL: Vitamin D, 25 OH, Total: 23 ng/mL — ABNORMAL LOW (ref 30–100)

## 2019-10-04 MED ORDER — SERTRALINE HCL 50 MG PO TABS
ORAL_TABLET | ORAL | 2 refills | Status: DC
Start: 2019-10-04 — End: 2019-12-19

## 2019-10-04 MED ORDER — ONDANSETRON 4 MG PO TBDP
4.00 mg | ORAL_TABLET | Freq: Three times a day (TID) | ORAL | 1 refills | Status: DC | PRN
Start: 2019-10-04 — End: 2019-12-08

## 2019-10-04 MED ORDER — DIAZEPAM 2 MG PO TABS
2.0000 mg | ORAL_TABLET | Freq: Three times a day (TID) | ORAL | 2 refills | Status: DC | PRN
Start: 2019-10-04 — End: 2020-08-15

## 2019-10-04 NOTE — Progress Notes (Signed)
Subjective:       Patient ID: Amber [name] is a 68 y.o. female.    HPI  Pt c/o    bilat lower leg numbness, burning x 1 month, went to ED, labs ok  No weakness, denies low back pain    Pt stated her sx Started after B12 injection via her hematologist.     Anxiety panic, frequent attacks x several months.   Denies depressed mood.     The following portions of the patient's history were reviewed and updated as appropriate: allergies, current medications, past family history, past medical history, past social history, past surgical history and problem list.    Review of Systems   Constitutional: Negative for appetite change, chills, fever and unexpected weight change.   Respiratory: Negative for shortness of breath.    Cardiovascular: Negative for chest pain and palpitations.   Gastrointestinal: Negative for abdominal pain, nausea and vomiting.   Genitourinary: Negative for dysuria.   Musculoskeletal: Negative for back pain and myalgias.   Neurological: Positive for numbness. Negative for syncope, weakness, light-headedness and headaches.     BP (!) 161/93   Pulse 68   Temp 97.9 F (36.6 C) (Temporal)   Ht 1.549 m (5\' 1" )   Wt 64.9 kg (143 lb)   BMI 27.02 kg/m        Objective:    Physical Exam  Constitutional:       General: She is not in acute distress.     Appearance: She is well-developed.   HENT:      Mouth/Throat:      Pharynx: No oropharyngeal exudate.   Eyes:      General: No scleral icterus.     Conjunctiva/sclera: Conjunctivae normal.   Neck:      Musculoskeletal: Neck supple.      Thyroid: No thyromegaly.      Vascular: No carotid bruit or JVD.   Cardiovascular:      Rate and Rhythm: Normal rate and regular rhythm.      Heart sounds: Normal heart sounds. No murmur. No friction rub. No gallop.    Pulmonary:      Effort: Pulmonary effort is normal. No respiratory distress.      Breath sounds: Normal breath sounds. No rales.   Abdominal:      General: Bowel sounds are normal. There is no distension.       Palpations: Abdomen is soft.      Tenderness: There is no abdominal tenderness. There is no guarding or rebound.   Musculoskeletal:      Right lower leg: No edema.      Left lower leg: No edema.   Neurological:      Mental Status: She is alert.      Comments: +decreased light touch, pin prick bilat lower legs.   No focal weakness.  Neg SLT             Assessment:       1. Neuropathy  Vitamin B6    Neurology Referral: [name], MD [facility])    Sedimentation rate (ESR)    C Reactive Protein   2. Anxiety  diazePAM (VALIUM) 2 MG tablet    ondansetron (ZOFRAN-ODT) 4 MG disintegrating tablet    sertraline (ZOLOFT) 50 MG tablet   3. Essential hypertension     4. White coat syndrome with diagnosis of hypertension            Plan:      Procedures  No orders of the defined types were placed in this encounter.      Labs ordered  Pt also has lab orders from bariatrics service.   rx refill. Continue meds  rx zoloft, valium  Referred pt to neuro, Dr. [name], for evaluation  Monitor home BP  F/u 4 wks.

## 2019-10-04 NOTE — Progress Notes (Signed)
Have you seen any specialists/other providers since your last visit with [place]?    Yes    Arm preference verified?   Yes    The patient is due for influenza vaccine and CPL

## 2019-10-05 ENCOUNTER — Encounter (INDEPENDENT_AMBULATORY_CARE_PROVIDER_SITE_OTHER): Payer: Self-pay | Admitting: Internal Medicine

## 2019-10-05 LAB — COPPER, SERUM: Copper: 1.16 (ref 0.75–1.45)

## 2019-10-05 LAB — ZINC: Zinc: 0.67 (ref 0.66–1.10)

## 2019-10-06 LAB — VITAMIN A: Vitamin A: 58.2 (ref 32.5–78.0)

## 2019-10-07 LAB — VITAMIN B6: Vitamin B6: 8 (ref 5–50)

## 2019-10-07 LAB — VITAMIN B1, PLASMA: Vitamin B1 (Thiamine): 10 nmol/L (ref 8–30)

## 2019-10-08 NOTE — Progress Notes (Signed)
Labs are normal    Rec:  See neurologist as recommended.  Keep follow up visit

## 2019-10-10 ENCOUNTER — Encounter (INDEPENDENT_AMBULATORY_CARE_PROVIDER_SITE_OTHER): Payer: Self-pay

## 2019-10-11 ENCOUNTER — Encounter (INDEPENDENT_AMBULATORY_CARE_PROVIDER_SITE_OTHER): Payer: Self-pay

## 2019-10-12 ENCOUNTER — Encounter (INDEPENDENT_AMBULATORY_CARE_PROVIDER_SITE_OTHER): Payer: Self-pay

## 2019-10-13 ENCOUNTER — Other Ambulatory Visit (INDEPENDENT_AMBULATORY_CARE_PROVIDER_SITE_OTHER): Payer: Self-pay | Admitting: Internal Medicine

## 2019-10-13 DIAGNOSIS — G629 Polyneuropathy, unspecified: Secondary | ICD-10-CM

## 2019-10-17 ENCOUNTER — Encounter (INDEPENDENT_AMBULATORY_CARE_PROVIDER_SITE_OTHER): Payer: Self-pay | Admitting: Internal Medicine

## 2019-11-01 ENCOUNTER — Encounter (INDEPENDENT_AMBULATORY_CARE_PROVIDER_SITE_OTHER): Payer: Self-pay

## 2019-11-01 ENCOUNTER — Ambulatory Visit (INDEPENDENT_AMBULATORY_CARE_PROVIDER_SITE_OTHER): Payer: Medicare Other | Admitting: Internal Medicine

## 2019-11-10 ENCOUNTER — Encounter (INDEPENDENT_AMBULATORY_CARE_PROVIDER_SITE_OTHER): Payer: Self-pay

## 2019-11-11 ENCOUNTER — Encounter (INDEPENDENT_AMBULATORY_CARE_PROVIDER_SITE_OTHER): Payer: Self-pay

## 2019-11-15 ENCOUNTER — Encounter (INDEPENDENT_AMBULATORY_CARE_PROVIDER_SITE_OTHER): Payer: Self-pay | Admitting: Internal Medicine

## 2019-11-15 ENCOUNTER — Ambulatory Visit (INDEPENDENT_AMBULATORY_CARE_PROVIDER_SITE_OTHER): Payer: Medicare Other | Admitting: Internal Medicine

## 2019-11-15 VITALS — BP 175/92 | HR 65 | Temp 98.2°F | Ht 61.0 in | Wt 143.0 lb

## 2019-11-15 DIAGNOSIS — E559 Vitamin D deficiency, unspecified: Secondary | ICD-10-CM

## 2019-11-15 DIAGNOSIS — F419 Anxiety disorder, unspecified: Secondary | ICD-10-CM

## 2019-11-15 DIAGNOSIS — I10 Essential (primary) hypertension: Secondary | ICD-10-CM

## 2019-11-15 DIAGNOSIS — Z1231 Encounter for screening mammogram for malignant neoplasm of breast: Secondary | ICD-10-CM

## 2019-11-15 MED ORDER — VITAMIN D (ERGOCALCIFEROL) 1.25 MG (50000 UT) PO CAPS
50000.00 [IU] | ORAL_CAPSULE | ORAL | 1 refills | Status: DC
Start: 2019-11-15 — End: 2022-01-29

## 2019-11-15 NOTE — Progress Notes (Signed)
Have you seen any specialists/other providers since your last visit with us?    No    Arm preference verified?   Yes    The patient is due for influenza vaccine and CPL.

## 2019-11-15 NOTE — Progress Notes (Signed)
Subjective:       Patient ID: Amber [name] is a 69 y.o. female.    HPI  Pt is here for follow up     White coat hypertension, home BP 110's/80's, no cp, no sob    Anxiety, has not started zoloft, takes occ valium still anxious not sleeping well    Leg tingling, got better, has not seen neuro.     Vitamin D def, out of rx supplement,   Lab Results   Component Value Date    VITD 23 (L) 10/04/2019    VITD 33 04/30/2017    VITD 28 04/30/2017    VITD 5 04/30/2017      The following portions of the patient's history were reviewed and updated as appropriate: allergies, current medications, past family history, past medical history, past social history, past surgical history and problem list.    Review of Systems   Constitutional: Negative for appetite change and unexpected weight change.   Respiratory: Negative for shortness of breath.    Cardiovascular: Negative for chest pain and palpitations.   Gastrointestinal: Negative for abdominal pain, nausea and vomiting.   Genitourinary: Negative for dysuria.   Musculoskeletal: Negative for myalgias.   Neurological: Negative for syncope, weakness, light-headedness, numbness and headaches.   Psychiatric/Behavioral: Positive for sleep disturbance. The patient is nervous/anxious.      BP (!) 175/92   Pulse 65   Temp 98.2 F (36.8 C) (Temporal)   Ht 1.549 m (5\' 1" )   Wt 64.9 kg (143 lb)   BMI 27.02 kg/m        Objective:    Physical Exam  Constitutional:       General: She is not in acute distress.     Appearance: She is well-developed.   HENT:      Head: Normocephalic.      Right Ear: External ear normal.      Left Ear: External ear normal.      Mouth/Throat:      Pharynx: No oropharyngeal exudate.   Eyes:      General: No scleral icterus.     Conjunctiva/sclera: Conjunctivae normal.      Pupils: Pupils are equal, round, and reactive to light.   Neck:      Musculoskeletal: Normal range of motion and neck supple.      Thyroid: No thyromegaly.      Vascular: No carotid  bruit or JVD.   Cardiovascular:      Rate and Rhythm: Normal rate and regular rhythm.      Heart sounds: Normal heart sounds. No murmur. No friction rub. No gallop.    Pulmonary:      Effort: Pulmonary effort is normal. No respiratory distress.      Breath sounds: Normal breath sounds. No wheezing or rales.   Abdominal:      General: Bowel sounds are normal. There is no distension.      Palpations: Abdomen is soft. There is no mass.      Tenderness: There is no abdominal tenderness. There is no guarding or rebound.   Genitourinary:     Comments: Deferred, pt sees ob/gyn  Musculoskeletal: Normal range of motion.         General: No tenderness.      Right lower leg: No edema.      Left lower leg: No edema.   Lymphadenopathy:      Cervical: No cervical adenopathy.   Skin:     General: Skin is  warm.      Findings: No rash.   Neurological:      Mental Status: She is alert and oriented to person, place, and time.      Cranial Nerves: No cranial nerve deficit.      Motor: No abnormal muscle tone.      Coordination: Coordination normal.             Assessment:       1. Anxiety     2. Vitamin D deficiency  vitamin D, ergocalciferol, (DRISDOL) 50000 UNIT Cap   3. White coat syndrome with diagnosis of hypertension     4. Encounter for screening mammogram for malignant neoplasm of breast  Mammo Digital Screening Bilateral W Cad          Plan:      Procedures  No orders of the defined types were placed in this encounter.      rx refills. Continue meds  Encouraged pt to start sertraline for anxiety  Monitor home BP  Mammogram ordered  F/u 2 months.

## 2019-11-24 ENCOUNTER — Other Ambulatory Visit (INDEPENDENT_AMBULATORY_CARE_PROVIDER_SITE_OTHER): Payer: Self-pay

## 2019-11-24 NOTE — Progress Notes (Signed)
11/24/19 1326   Pt Outreach/Care Plan Metrics   [employer] for Outreach MSSP   Outreach Status for Metrics listing Left Voicemail 1       Pt was in [facility] report list. Nurse Navigator called pt today to offer care management support. Pt did not answer. VM left with this NN's contact information and a request for a callback.      [id number] Vivien Presto, RN BSN CCM  Patient Public relations account executive in [place], [place]  [address]  [place]  [place], [place], [address]  [phone]

## 2019-12-01 ENCOUNTER — Other Ambulatory Visit (INDEPENDENT_AMBULATORY_CARE_PROVIDER_SITE_OTHER): Payer: Self-pay

## 2019-12-01 NOTE — Progress Notes (Signed)
Care Plan      Name: Amber [name]     MRN: [id number]       DOB:   10-Aug-1951     PCP: Oneita Jolly, MD      Advance Directives:  N     Communication Preferences: phone    Diagnosis:  Patient Active Problem List   Diagnosis   . Essential hypertension   . Anxiety state, unspecified   . Abnormal electrocardiogram   . Cerebral infarction   . Gastroesophageal reflux disease   . Hypernatremia   . Paresthesia   . Slow transit constipation   . Vitamin D deficiency   . Overactive bladder   . S/P laparoscopic cholecystectomy   . Transient cerebral ischemia, unspecified type   . Palpitations   . Pituitary microadenoma   . Dyslipidemia   . Other long term (current) drug therapy   . Headache   . History of spinal surgery   . Obstructive sleep apnea syndrome   . Hiatal hernia   . Osteoarthritis of knees, bilateral   . Bariatric surgery status   . Nutritional deficiency   . Anxiety   . Epigastric abdominal tenderness without rebound tenderness   . Encounter for vitamin deficiency screening   . Nausea   . Other dysphagia   . Small bowel obstruction due to adhesions   . Allergic rhinitis   . Chronic frontal sinusitis   . Chronic sphenoidal sinusitis   . Sinusitis   . Primary osteoarthritis of both knees   . Labile hypertension   . Postsurgical dumping syndrome       Patient Care Team:  Oneita Jolly, MD as PCP - General (Internal Medicine)  Darnelle Maffucci, MD as Consulting Physician (Cardiology)  Brett Canales, MD as Consulting Physician (Gastroenterology)  Renard Hamper, MD as Consulting Physician (Clinical Cardiac Electrophysiology)  Vedia Coffer, MD as Consulting Physician (Critical Care Medicine)  Nicola Police, DO as Consulting Physician (Surgery)  Press, Tyler Deis, MD as Consulting Physician (Orthopaedic Surgery)  Smitty Pluck R, RN as Case Manager    Health Maintenance   Topic Date Due   . INFLUENZA VACCINE  06/03/2019   . [facility] CARE PLAN  LETTER  06/21/2019   . Pneumonia Vaccine Age 71+ (2 of 2 - PPSV23) 11/29/2019   . MAMMOGRAM  01/02/2020   . FALLS RISK ANNUAL  03/28/2020   . DEPRESSION SCREENING  03/28/2020   . Medicare Annual Wellness Visit  03/28/2020   . COLONOSCOPY TEN YEARS  03/27/2026   . HEPATITIS C SCREENING  Completed   . Advance Directive on File  Completed   . PAP SMEAR  Discontinued   . Shingrix Vaccine 50+  Discontinued         Immunization History   Administered Date(s) Administered   . Influenza Vacc QUAD Recombinant PF 67yrs & up 11/28/2018   . Influenza quadrivalent (IM) PF 3 Yrs & greater 11/03/2016   . Pneumococcal Conjugate 13-Valent 11/28/2018   . Td 02/26/1999            Allergies   Allergen Reactions   . Acyclovir Nausea And Vomiting     Other reaction(s): gi distress   . Amlodipine      nausea   . Amoxicillin-Pot Clavulanate      Other reaction(s): gi distress     . Aspirin Nausea And Vomiting     Patient states "it upsets my stomach"  Other reaction(s): gi distress  Upsets  stomach  Able to tolerate enteric coated aspirin   . Atorvastatin        Palpitation   . Carvedilol      Extreme fatigue   . Lisinopril      nausea   . Lovastatin Nausea And Vomiting     Other reaction(s): gi distress   . Metronidazole Nausea And Vomiting     Other reaction(s): gi distress   . Moxifloxacin Nausea And Vomiting         GI symptoms   . Rosuvastatin      Palpitation, chest pain, abd pain    . Statins      Chest pain, palpitations.   . Sulfa Antibiotics Nausea And Vomiting     Other reaction(s): gi distress          Current Outpatient Medications   Medication Sig Note   . aspirin EC 81 MG EC tablet Take 1 tablet (81 mg total) by mouth daily.     Marland Kitchen atenolol (Tenormin) 50 MG tablet Take 1 tablet (50 mg total) by mouth daily    . clotrimazole-betamethasone (LOTRISONE) cream Apply to affected area 2 times daily    . diazePAM (VALIUM) 2 MG tablet Take 1 tablet (2 mg total) by mouth every 8 (eight) hours as needed for Anxiety    . fluticasone  (FLONASE) 50 MCG/ACT nasal spray 2 sprays by Nasal route daily. (Patient taking differently: 2 sprays by Nasal route as needed.   )    . Multiple Vitamin (MULTIVITAMIN) capsule Take 1 capsule by mouth daily    . ondansetron (ZOFRAN-ODT) 4 MG disintegrating tablet Take 1 tablet (4 mg total) by mouth every 8 (eight) hours as needed for Nausea    . pantoprazole (PROTONIX) 40 MG tablet TAKE 1 TABLET(40 MG) BY MOUTH DAILY    . valACYclovir HCL (VALTREX) 500 MG tablet TAKE 1 TABLET(500 MG) BY MOUTH DAILY (Patient taking differently: Take 500 mg by mouth as needed TAKE 1 TABLET(500 MG) BY MOUTH DAILY  )    . vitamin D, ergocalciferol, (DRISDOL) 50000 UNIT Cap Take 1 capsule (50,000 Units total) by mouth once a week    . alendronate (FOSAMAX) 70 MG tablet TAKE 1 TABLET BY MOUTH EVERY WEEK AS DIRECTED (Patient not taking: Reported on 12/01/2019)    . Cyanocobalamin (B-12 IJ) Inject as directed once a week    . sertraline (ZOLOFT) 50 MG tablet Take 1/2 tab po daily x 1 wk, then 1 tab daily (Patient not taking: Reported on 12/01/2019  )      Pt was in [facility] report list.Nurse Navigator spoke with pt today and introduced and offered care management support. Pt amenable to participating with care management.     Pt said she is independent with ADLs and IADLs. She lives with her son. 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     Pt went to [facility] ER on 1/21 for chest pain and epigastric pain.  Pt said that she woke up feeling like her heart was racing. She has also been having mid upper abdominal pain. Pt said that CP has resolved and denied having SOB and being in distress. She is still having the abdominal pain and said it is continuous, spasmic, non-radiating, and pt rates it at 10/10 though she said that she is able to do her regular daily activities and does not feel the need for pain medication at this time. she denied having any GI symptoms. Pt said  that she has a history of hiatal hernia  (2016) and bariatric surgery (2016).     Pt said all her symptoms started when she began taking the Zoloft prescribed by PCP last month, December,  for pt's anxiety. Pt said she started taking it last week but ended up taking it for four days as she believes it was causing her side effects that eventually led her to the ER on 1/21. She has not taken it again since. NN offered to ask PCP for an alterative medication but pt declined. She said she is doing well. Pt denied SI/HI.     Goals:   See a GI for abdominal pain  Get BH therapy for anxiety    Action Items:   Attend scheduled GI telemedicine appointment on 2/2 at 11:15 AM  Consider restarting BH therapy  Use proper techniques in BP monitoring    Education provided to meet goals:   * Pt said that she has a GI provider. NN advised pt to see GI and offered to schedule appointment. Pt has a GI provider. Pt was amenable and preferred to do it by telemedicine as her son had close contact on 1/21 with a COVID-19 positive person and though they tested negative in [facility] on 1/26. We reviewed CDC and [facility] guidelines. Pt said she and son will just continue plan to quarantine for 14 days since son's contact with the COVD (+) individual. Pt denied she or her son are having any COVID-19 symptoms.  NN scheduled pt to see Dr. Laurence Spates by telemedicine on 2/2 at 11:15 AM. Pt made aware. Tiffany of Dr. Jonetta Osgood clinic to call pt for instructions.  Pt urged to go to the ER if symptoms get worse of new symptoms develop. Pt verbalized understanding.     * BH therapy   Pt said her anxiety attack stemmed from the sudden death from heart attack of the older of his two sons in August 2020. As stated above, pt does not want to try any alternative to Zoloft for now. She said she is coping well with anxiety and grief through prayers, talking to her other son, and keeping busy with school.     NN encouraged pt to consider getting BH therapy. Pt reported that she used to see a [facility]  therapist before but stopped d/t the copay. Pt said her school was offering it for free. When NN called pt again to inform of GI appointment, pt said she has already called her school and was told she can get three [facility] therapy sessions and the rest will have a copay. Pt said she will try [facility] therapy from school first as it is free, and if it does not work out, she will go back to previous therapist as she felt very comfortable with her.     * BP management  Pt has a history of high BP. She said she has white coat syndrome. Pt stated that her BP at home has been running in the 110s-130s/70s-80s. She logs her results and brings it to her PCP and cardiology appointments. Positive reinforcement provided by NN and encouraged pt to continue. NN provided education on the proper techniques in BP monitoring. Pt verbalized understanding and was able to teach back techniques.     Pt said that she controls her intake of carbs, and limits sodium, fats and sweets intake. She said that since her bariatric surgery, excessive fats, oils and sweets upset her stomach. Pt said that she tries to  eat as healthy as she can, given diet limitations since surgery, as she wants to keep her weight down and stay healthy. Positive reinforcement provided by NN and encouraged pt to continue.     Pt said she does not exercise. NN stressed the importance of regular exercise in her overall health and in reduction of health risks. Pt was encouraged to start taking a brisk walk daily, maybe 10 mins/day for starters, with a goal of eventually exercising for at least 150 mins/day. Pt said she will try, and will also start using her exercise equipment at home. Pt does cite knee arthritis for which a specialist is following her.      Pt agreed to receive educational and Estate manager/land agent by email. Pt verified that the email address on file is correct. The following were emailed to the pt:  How to Measure BP at Home- American Heart Association  Heart Healthy  Plate- [facility] and [employer]      Requested for pt to call NN for any care management questions or needs in between NN's outreaches. Pt verbalized understanding and appreciation for the support provided. Will continue to follow.     Chart cc'd to PCP.       Follow-Up: NN will continue to follow pt on the above topics and assess for other needs.     [id number] Vivien Presto, RN BSN CCM  Patient Care Navigator  [facility]   Enterprise [facility]  [facility] [place], [place]  [address]  [address]  [place], [place], [address]         [phone]

## 2019-12-01 NOTE — Progress Notes (Signed)
12/01/19 1645   General Assessment   Assessment completed with patient   Cognitive Issues No   Patient lives with children  (son)   Capacity/Guardianship Issue No   Housing single family/private residence   Support system children   Current home care services No   Limited Mobility No   Transportation issues No   Abuse or neglect No   Substance Abuse No   Housing concerns No   Health Literacy Assessed Yes   No consistent medical care No   Visual Impairment No   Hearing Impaired No   Coping Skills Yes   Coping Skills List Faith Based;Other  (talking to son, keeping busy with studies)   Diet - Type Regular   Exercise No  (needs motivation)       [id number] Vivien Presto, RN BSN CCM  Patient Care Navigator  [facility]   Enterprise Care Management  [facility] in [place], [place]  [address]  [place]  [place], [place], [address]  [phone]

## 2019-12-05 ENCOUNTER — Other Ambulatory Visit (INDEPENDENT_AMBULATORY_CARE_PROVIDER_SITE_OTHER): Payer: Self-pay | Admitting: Internal Medicine

## 2019-12-05 MED ORDER — PANTOPRAZOLE SODIUM 40 MG PO TBEC
40.00 mg | DELAYED_RELEASE_TABLET | Freq: Every day | ORAL | 1 refills | Status: DC
Start: 2019-12-05 — End: 2020-06-07

## 2019-12-08 ENCOUNTER — Emergency Department: Payer: Medicare Other

## 2019-12-08 ENCOUNTER — Emergency Department
Admission: EM | Admit: 2019-12-08 | Discharge: 2019-12-08 | Disposition: A | Discharge status: Home / Self Care | Payer: Medicare Other | Attending: Emergency Medicine | Admitting: Emergency Medicine

## 2019-12-08 DIAGNOSIS — Z8249 Family history of ischemic heart disease and other diseases of the circulatory system: Secondary | ICD-10-CM | POA: Insufficient documentation

## 2019-12-08 DIAGNOSIS — Z20822 Contact with and (suspected) exposure to covid-19: Secondary | ICD-10-CM | POA: Insufficient documentation

## 2019-12-08 DIAGNOSIS — Z9049 Acquired absence of other specified parts of digestive tract: Secondary | ICD-10-CM | POA: Insufficient documentation

## 2019-12-08 DIAGNOSIS — E785 Hyperlipidemia, unspecified: Secondary | ICD-10-CM | POA: Insufficient documentation

## 2019-12-08 DIAGNOSIS — I10 Essential (primary) hypertension: Secondary | ICD-10-CM | POA: Insufficient documentation

## 2019-12-08 DIAGNOSIS — Z8673 Personal history of transient ischemic attack (TIA), and cerebral infarction without residual deficits: Secondary | ICD-10-CM | POA: Insufficient documentation

## 2019-12-08 DIAGNOSIS — Z7982 Long term (current) use of aspirin: Secondary | ICD-10-CM | POA: Insufficient documentation

## 2019-12-08 DIAGNOSIS — R1013 Epigastric pain: Secondary | ICD-10-CM | POA: Insufficient documentation

## 2019-12-08 DIAGNOSIS — D259 Leiomyoma of uterus, unspecified: Secondary | ICD-10-CM | POA: Insufficient documentation

## 2019-12-08 DIAGNOSIS — Z9884 Bariatric surgery status: Secondary | ICD-10-CM | POA: Insufficient documentation

## 2019-12-08 DIAGNOSIS — Z23 Encounter for immunization: Secondary | ICD-10-CM | POA: Insufficient documentation

## 2019-12-08 DIAGNOSIS — N281 Cyst of kidney, acquired: Secondary | ICD-10-CM | POA: Insufficient documentation

## 2019-12-08 LAB — COMPREHENSIVE METABOLIC PANEL
Anion Gap: 12 (ref 5.0–15.0)
BUN: 9 mg/dL (ref 7.0–19.0)
CO2: 24 mEq/L (ref 22–29)
Calcium: 8.9 mg/dL (ref 8.5–10.5)
Chloride: 106 mEq/L (ref 100–111)
Creatinine: 0.7 mg/dL (ref 0.6–1.0)
Glucose: 86 mg/dL (ref 70–100)
Potassium: 3.9 mEq/L (ref 3.5–5.1)
Sodium: 142 mEq/L (ref 136–145)

## 2019-12-08 LAB — CBC AND DIFFERENTIAL
Absolute NRBC: 0 10*3/uL (ref 0.00–0.00)
Basophils Absolute Automated: 0.03 10*3/uL (ref 0.00–0.08)
Basophils Automated: 0.6 %
Eosinophils Absolute Automated: 0.05 10*3/uL (ref 0.00–0.44)
Eosinophils Automated: 1.1 %
Hematocrit: 43.3 % (ref 34.7–43.7)
Hgb: 14.1 g/dL (ref 11.4–14.8)
Immature Granulocytes Absolute: 0.01 10*3/uL (ref 0.00–0.07)
Immature Granulocytes: 0.2 %
Lymphocytes Absolute Automated: 1.4 10*3/uL (ref 0.42–3.22)
Lymphocytes Automated: 29.8 %
MCH: 29.7 pg (ref 25.1–33.5)
MCHC: 32.6 g/dL (ref 31.5–35.8)
MCV: 91.2 fL (ref 78.0–96.0)
MPV: 9.6 fL (ref 8.9–12.5)
Monocytes Absolute Automated: 0.44 10*3/uL (ref 0.21–0.85)
Monocytes: 9.4 %
Neutrophils Absolute: 2.77 10*3/uL (ref 1.10–6.33)
Neutrophils: 58.9 %
Nucleated RBC: 0 /100 WBC (ref 0.0–0.0)
Platelets: 230 10*3/uL (ref 142–346)
RBC: 4.75 10*6/uL (ref 3.90–5.10)
RDW: 14 % (ref 11–15)
WBC: 4.7 10*3/uL (ref 3.10–9.50)

## 2019-12-08 LAB — COVID-19 (SARS-COV-2): SARS-CoV-2 Overall Result: NEGATIVE

## 2019-12-08 LAB — GFR: EGFR: >60

## 2019-12-08 LAB — HEPATIC FUNCTION PANEL
ALT: 18 U/L (ref 0–55)
AST (SGOT): 18 U/L (ref 5–34)
Albumin/Globulin Ratio: 1.8 (ref 0.9–2.2)
Albumin: 4.2 g/dL (ref 3.5–5.0)
Alkaline Phosphatase: 75 U/L (ref 37–106)
Bilirubin Direct: 0.3 mg/dL (ref 0.0–0.5)
Bilirubin Indirect: 0.2 mg/dL (ref 0.2–1.0)
Bilirubin, Total: 0.5 mg/dL (ref 0.2–1.2)
Globulin: 2.3 g/dL (ref 2.0–3.6)
Protein, Total: 6.5 g/dL (ref 6.0–8.3)

## 2019-12-08 LAB — URINALYSIS REFLEX TO MICROSCOPIC EXAM - REFLEX TO CULTURE
Bilirubin, UA: NEGATIVE
Blood, UA: NEGATIVE
Glucose, UA: NEGATIVE
Ketones UA: NEGATIVE
Leukocyte Esterase, UA: NEGATIVE
Nitrite, UA: NEGATIVE
Protein, UR: NEGATIVE
Specific Gravity UA: <=1.005 (ref 1.001–1.035)
Urine pH: 7 (ref 5.0–8.0)
Urobilinogen, UA: 0.2 mg/dL (ref 0.2–2.0)
WBC, UA: NONE SEEN /hpf (ref 0–5)

## 2019-12-08 LAB — LIPASE: Lipase: 14 U/L (ref 8–78)

## 2019-12-08 LAB — HCG, SERUM, QUALITATIVE: Hcg Qualitative: NEGATIVE

## 2019-12-08 MED ORDER — ONDANSETRON HCL 4 MG/2ML IJ SOLN
4.00 mg | Freq: Once | INTRAMUSCULAR | Status: AC
Start: 2019-12-08 — End: 2019-12-08
  Administered 2019-12-08: 13:00:00 4 mg via INTRAVENOUS
  Filled 2019-12-08: qty 2

## 2019-12-08 MED ORDER — SODIUM CHLORIDE 0.9 % IV BOLUS
1000.00 mL | Freq: Once | INTRAVENOUS | Status: AC
Start: 2019-12-08 — End: 2019-12-08
  Administered 2019-12-08: 13:00:00 1000 mL via INTRAVENOUS

## 2019-12-08 MED ORDER — INFLUENZA VAC A&B SA ADJ QUAD 0.5 ML IM PRSY
PREFILLED_SYRINGE | Freq: Once | INTRAMUSCULAR | Status: AC
Start: 2019-12-08 — End: 2019-12-08
  Administered 2019-12-08: 13:00:00 0.5 mL via INTRAMUSCULAR
  Filled 2019-12-08: qty 0.5

## 2019-12-08 MED ORDER — MORPHINE SULFATE 4 MG/ML IJ/IV SOLN (WRAP)
4.0000 mg | Freq: Once | Status: AC
Start: 2019-12-08 — End: 2019-12-08
  Administered 2019-12-08: 14:00:00 4 mg via INTRAVENOUS
  Filled 2019-12-08: qty 1

## 2019-12-08 MED ORDER — SUCRALFATE 1 G PO TABS
1.0000 g | ORAL_TABLET | Freq: Four times a day (QID) | ORAL | 0 refills | Status: AC
Start: 2019-12-08 — End: 2019-12-13

## 2019-12-08 MED ORDER — ONDANSETRON 4 MG PO TBDP
4.00 mg | ORAL_TABLET | Freq: Four times a day (QID) | ORAL | 0 refills | Status: DC | PRN
Start: 2019-12-08 — End: 2021-04-28

## 2019-12-08 MED ORDER — NALOXONE HCL 4 MG/0.1ML NA LIQD
NASAL | 0 refills | Status: DC
Start: 2019-12-08 — End: 2019-12-19

## 2019-12-08 MED ORDER — SUCRALFATE 1 G PO TABS
1.0000 g | ORAL_TABLET | Freq: Once | ORAL | Status: AC
Start: 2019-12-08 — End: 2019-12-08
  Administered 2019-12-08: 15:00:00 1 g via ORAL
  Filled 2019-12-08: qty 1

## 2019-12-08 MED ORDER — IOHEXOL 350 MG/ML IV SOLN
100.00 mL | Freq: Once | INTRAVENOUS | Status: AC | PRN
Start: 2019-12-08 — End: 2019-12-08
  Administered 2019-12-08: 14:00:00 100 mL via INTRAVENOUS

## 2019-12-08 MED ORDER — HYDROCODONE-ACETAMINOPHEN 5-325 MG PO TABS
1.0000 | ORAL_TABLET | Freq: Four times a day (QID) | ORAL | 0 refills | Status: AC | PRN
Start: 2019-12-08 — End: 2019-12-15

## 2019-12-08 NOTE — ED Provider Notes (Signed)
EMERGENCY DEPARTMENT NOTE     ED PHYSICIAN ASSIGNED     Date/Time Event User Comments    12/08/19 1257 Physician Assigned Amber Home, MD assigned as Attending         ED MIDLEVEL (APP) ASSIGNED     None           HISTORY OF PRESENT ILLNESS   Historian: patient  Translator Used: no    69 y.o. female with hx of gastric bypass, gerd, hiatal hernia, presents with epigastric abdominal pain    1. Location of symptoms: epigastric abdominal pain  2. Onset of symptoms: over 1 wk  3. What was patient doing when symptoms started (Context): GI, Amber [name] told her to take gaviscon, had appt 2 days ago  4. Severity: 9/10  5. Timing: gradual onset, constant  6. Activities that worsen symptoms: none  7. Activities that improve symptoms: none  8. Quality: epigastric abdominal pain. Feels like someone is standing on abdomen, burning also.   9. Radiation of symptoms: radiates from back into upper abdomen  10. Associated signs and Symptoms: nausea, no vomiting, eating today, ate oatmeal this morning, had a loose bowel movement  11. Are symptoms worsening? yes          MEDICAL HISTORY     Past Medical History:  Past Medical History:   Diagnosis Date   . Asthma    . Bronchitis 01/02/2019   . Chest pain 2016     Chest pain after eating x6  months--being followed by GI for GERD   . Claustrophobia     MRIs   . Constipation    . Genital herpes     no current outbreak (10/03/15)   . GERD (gastroesophageal reflux disease)    . Hiatal hernia     h/o surgery   . Hyperlipidemia    . Hypertensive disorder     Well controlled on meds per pt. Elevated with pain.   . Low back pain    . Nausea without vomiting    . OSA on CPAP 2015    CPAP nightly x 1 yr, used nightly.   . Osteoarthritis of knees, bilateral     and back   . SBO (small bowel obstruction) 09/2018   . TIA (transient ischemic attack) 2014    TIA 2014-no residual. Patient evaulated for possible TIA 07/12/2015  @ [facility] (dischage summary in epic)-per summary CT  of head was normal and patient was d/c'd       Past Surgical History:  Past Surgical History:   Procedure Laterality Date   . APPENDECTOMY  >25 yrs   . CHOLECYSTECTOMY     . EGD  01/2015   . EGD N/A 01/23/2019    Procedure: EGD;  Surgeon: Amber Mungo, MD;  Location: [facility];  Service: General;  Laterality: N/A;  ENDOSCOPY  MD REQ=30MINS; Q1=UNK   . EGD, BIOPSY N/A 09/15/2017    Procedure: EGD, BIOPSY;  Surgeon: Amber Proud, MD;  Location: [facility];  Service: Gastroenterology;  Laterality: N/A;   . fallopian tube surgery  >25 yrs   . INSERTION, PAIN PUMP (MEDICAL) N/A 10/22/2015    Procedure: INSERTION, PAIN PUMP (MEDICAL);  Surgeon: Amber Police, DO;  Location: [facility] [place] [facility];  Service: General;  Laterality: N/A;   . LAPAROSCOPIC, CHOLECYSTECTOMY, CHOLANGIOGRAM  08/13/2014   . LAPAROSCOPIC, GASTRIC BYPASS N/A 10/22/2015    Procedure: LAPAROSCOPIC, GASTRIC BYPASS;  Surgeon: Amber Half R, DO;  Location: [place] [facility];  Service: General;  Laterality: N/A;   . LAPAROSCOPIC, HERNIORRHAPHY, HIATAL N/A 10/22/2015    Procedure: LAPAROSCOPIC, HERNIORRHAPHY, HIATAL;  Surgeon: Amber Police, DO;  Location: [place] [facility];  Service: General;  Laterality: N/A;   . LAPAROSCOPIC, LYSIS, ADHESIONS N/A 09/01/2018    Procedure: LAPAROSCOPIC, LYSIS, ADHESIONS RELEASE OF  SMALL BOWEL OBSTRUCTION;  Surgeon: Amber Mungo, MD;  Location: [place] [facility];  Service: General;  Laterality: N/A;   . LAPAROSCOPY, DIAGNOSTIC N/A 09/01/2018    Procedure: LAPAROSCOPY, DIAGNOSTIC;  Surgeon: Amber Mungo, MD;  Location: [place] [facility];  Service: General;  Laterality: N/A;   . OVARY SURGERY Right >25 yrs   . SPINE SURGERY  11/2013    cervical HNP x 2, Amber [name]       Social History:  Social History     Socioeconomic History   . Marital status: Single     Spouse name: Not on file   . Number of children: 2   . Years of education: Not on file   . Highest education level: Not on file   Occupational History    . Occupation: Acupuncturist: [employer]    Social Needs   . Financial resource strain: Not on file   . Food insecurity     Worry: Not on file     Inability: Not on file   . Transportation needs     Medical: Not on file     Non-medical: Not on file   Tobacco Use   . Smoking status: Never Smoker   . Smokeless tobacco: Never Used   Substance and Sexual Activity   . Alcohol use: Never     Frequency: Never   . Drug use: Never   . Sexual activity: Never     Birth control/protection: Post-menopausal   Lifestyle   . Physical activity     Days per week: Not on file     Minutes per session: Not on file   . Stress: Not on file   Relationships   . Social Wellsite geologist on phone: Not on file     Gets together: Not on file     Attends religious service: Not on file     Active member of club or organization: Not on file     Attends meetings of clubs or organizations: Not on file     Relationship status: Not on file   . Intimate partner violence     Fear of current or ex partner: Not on file     Emotionally abused: Not on file     Physically abused: Not on file     Forced sexual activity: Not on file   Other Topics Concern   . Dietary supplements / vitamins Yes   . Anesthesia problems No   . Blood thinners No   . Pregnant Not Asked   . Future Children Not Asked   . Number of Pregnancies? Not Asked   . Number of children Not Asked   . Miscarriages / Abortions? Not Asked   . Eats large amounts No   . Excessive Sweets No   . Skips meals No   . Eats excessive starches Yes   . Snacks or grazes No   . Emotional eater Yes   . Eats fried food Yes   . Eats fast food Yes   . Diet Center No   .  HMR No   . [name] No   . [place] Weight Loss No   . Nutri-System No   . Opti-Fast / Medi-Fast No   . Overeaters Anonymous No   . Physicians Weight Loss Center No   . TOPS No   . Weight Watchers No   . Atkins No   . Binging / Purging No   . Body for Life No   . Cabbage Soup No   . Calorie Counting No   .  Fasting No   . [name] No   . Health Spa No   . Herbal Life No   . High Protein No   . Low Carb No   . Low Fat No   . Mayo [facility] Diet No   . [place] Diet No   . Margie Billet Diet No   . [place] Diet No   . Slim Fast No   . [place] No   . Sugar Busters No   . Vomiting No   . Zone Diet No   . Stationary cycle or treadmill No   . Gym/fitness Classes No   . [name] exercise/video No   . Swimming No   . Team sports No   . Weight training No   . Walking or running No   . Hospitalization No   . Hypnosis No   . Physical therapy No   . Psychological therapy No   . Residential program No   . Acutrim No   . Amphetamines No   . Anorex No   . Byetta No   . Dexatrim No   . Didrex No   . Fastin No   . Fen - Phen No   . Ionamin / Adipex No   . Mazanor No   . Meridia No   . Obalan No   . Phendiet No   . Phentrol No   . Phentermine Yes   . Plegine No   . Pondimin No   . Qsymia No   . Prozac No   . Redux No   . Sanorex No   . Tenuate No   . Tepanole No   . Wechless No   . Wellbutrin No   . Xenical (Orlistat, Alli) No   . Other Med No   . No impairment Yes     Comment: Chronic Bilat Knee Pain   . Walks with cane/crutch Yes   . Requires a wheelchair No   . Bedridden No   . Are you currently being treated for depression? No   . Do you snore? Yes   . Are you receiving any medical or psychological services? No   . Do you ever wake up at night gasping for breath? Not Asked   . Do you have or have you been treated for an eating disorder? No   . Anyone ever told you that you stop breathing while asleep? Not Asked   . Do you exercise regularly? No   . Have you or family member ever have trouble with anesthesia? No   Social History Narrative   . Not on file       Family History:  Family History   Problem Relation Age of Onset   . Cancer Mother 57        breast cancer   . Breast cancer Mother    . Heart disease Father    . Malignant hyperthermia Neg Hx    . Pseudochol deficiency Neg Hx  Outpatient Medication:  Previous  Medications    ALENDRONATE (FOSAMAX) 70 MG TABLET    TAKE 1 TABLET BY MOUTH EVERY WEEK AS DIRECTED    ASPIRIN EC 81 MG EC TABLET    Take 1 tablet (81 mg total) by mouth daily.    ATENOLOL (TENORMIN) 50 MG TABLET    Take 1 tablet (50 mg total) by mouth daily    CLOTRIMAZOLE-BETAMETHASONE (LOTRISONE) CREAM    Apply to affected area 2 times daily    CYANOCOBALAMIN (B-12 IJ)    Inject as directed once a week    DIAZEPAM (VALIUM) 2 MG TABLET    Take 1 tablet (2 mg total) by mouth every 8 (eight) hours as needed for Anxiety    FLUTICASONE (FLONASE) 50 MCG/ACT NASAL SPRAY    2 sprays by Nasal route daily.    MULTIPLE VITAMIN (MULTIVITAMIN) CAPSULE    Take 1 capsule by mouth daily    PANTOPRAZOLE (PROTONIX) 40 MG TABLET    Take 1 tablet (40 mg total) by mouth daily    SERTRALINE (ZOLOFT) 50 MG TABLET    Take 1/2 tab po daily x 1 wk, then 1 tab daily    VALACYCLOVIR HCL (VALTREX) 500 MG TABLET    TAKE 1 TABLET(500 MG) BY MOUTH DAILY    VITAMIN D, ERGOCALCIFEROL, (DRISDOL) 50000 UNIT CAP    Take 1 capsule (50,000 Units total) by mouth once a week       Allergies:  Allergies   Allergen Reactions   . Acyclovir Nausea And Vomiting     Other reaction(s): gi distress   . Amlodipine      nausea   . Amoxicillin-Pot Clavulanate      Other reaction(s): gi distress     . Aspirin Nausea And Vomiting     Patient states "it upsets my stomach"  Other reaction(s): gi distress  Upsets stomach  Able to tolerate enteric coated aspirin   . Atorvastatin        Palpitation   . Carvedilol      Extreme fatigue   . Lisinopril      nausea   . Lovastatin Nausea And Vomiting     Other reaction(s): gi distress   . Metronidazole Nausea And Vomiting     Other reaction(s): gi distress   . Moxifloxacin Nausea And Vomiting         GI symptoms   . Rosuvastatin      Palpitation, chest pain, abd pain    . Statins      Chest pain, palpitations.   . Sulfa Antibiotics Nausea And Vomiting     Other reaction(s): gi distress       REVIEW OF SYSTEMS   Review of  Systems   Constitutional: Negative for fever.   Gastrointestinal: Positive for abdominal pain and nausea.        Retching but able to eat oatmeal today   All other systems reviewed and are negative.        PHYSICAL EXAM     Vitals:    12/08/19 1254   BP: 197/89   Pulse: 64   Resp: 18   Temp: 98.3 F (36.8 C)   SpO2: 98%       Nursing note and vitals reviewed.    Constitutional: non-toxic  Head: Atraumatic.  Eyes: No scleral icterus.  ENT: masked  Neck: Supple.   Pulmonary/Chest: No evidence of respiratory distress.   GI: epigastric tenderness  Extremities: No edema. No deformity.  Skin:  No rash.   Neurological: Awake, alert and oriented x 3. CN II-XII intact.   Psychiatric: Appropriate affect. Appropriate mood. Appropriate behavior.    MEDICAL DECISION MAKING   Epigastric pain. Tender upper abdomen. Diff- pancreatitis, hepatitis, choledocholithiasis (no gallbladder, s/p chole), gastritis, pud, gerd, bowel obstruction. Labs obtained and reviewed. Wnl. Ct abdomen pelv to rule out bowel obstruction, perforated ulcer. Ct wnl. Discussed management of her symptoms. Referring to her gi. Prescribed zofran, carafate, norco, narcan ([place] pmp shows benzos, denies taking these). Discussed safe use of opioids.    DISCUSSION      Vital Signs: Reviewed the patient's vital signs.   Nursing Notes: Reviewed and utilized available nursing notes.  Medical Records Reviewed: Reviewed available past medical records.  Counseling: The emergency provider has spoken with the patient and discussed today's findings, in addition to providing specific details for the plan of care.  Questions are answered and there is agreement with the plan.    IMAGING STUDIES    The following imaging studies were reviewed by the Emergency Medicine Physician.  For full imaging study results please see chart.    CT Abd/ Pelvis with IV Contrast   Final Result      1. Gastric bypass surgery. No bowel obstruction. Appendix is not   visualized but there is no right  lower quadrant inflammation.   2. Stable liver and renal cysts.   3. Stable fibroids.              Kinnie Feil, MD    12/08/2019 2:20 PM          CARDIAC STUDIES     The following cardiac studies were independently interpreted by the Emergency Medicine Physician. For full cardiac study results please see chart     PULSE OXIMETRY        EMERGENCY DEPT. MEDICATIONS      ED Medication Orders (From admission, onward)    Start Ordered     Status Ordering Provider    12/08/19 1455 12/08/19 1454  sucralfate (CARAFATE) tablet 1 g  Once     Route: Oral  Ordered Dose: 1 g     Last MAR action: Given Fadumo Heng W    12/08/19 1408 12/08/19 1408  iohexol (OMNIPAQUE) 350 MG/ML injection 100 mL  IMG once as needed     Route: Intravenous  Ordered Dose: 100 mL     Last MAR action: Imaging Agent Given Georgana Curio    12/08/19 1318 12/08/19 1317  morphine injection 4 mg  Once     Route: Intravenous  Ordered Dose: 4 mg     Last MAR action: Given Georgana Curio    12/08/19 1303 12/08/19 1302  influenza adjuvanted quadrivalent vaccine (PF) (FLUAD) IM injection (age 79 & greater)  Once     Route: Intramuscular     Last MAR action: Given Georgana Curio    12/08/19 1303 12/08/19 1302  sodium chloride 0.9 % bolus 1,000 mL  Once     Route: Intravenous  Ordered Dose: 1,000 mL     Last MAR action: New Bag Audrielle Vankuren W    12/08/19 1303 12/08/19 1302  ondansetron (ZOFRAN) injection 4 mg  Once     Route: Intravenous  Ordered Dose: 4 mg     Last MAR action: Given Nyjah Denio W          LABORATORY RESULTS    Ordered and independently interpreted AVAILABLE laboratory tests. Please see results section in chart  for full details.  Results for orders placed or performed during the hospital encounter of 12/08/19   COVID-19 (SARS-COV-2) (Rapid test)    Specimen: Nasopharynx; Nasopharyngeal Swab   Result Value Ref Range    Purpose of COVID testing Diagnostic -PUI     SARS-CoV-2 Specimen Source Nasopharyngeal     SARS CoV 2  Overall Result Negative    CBC and differential   Result Value Ref Range    WBC 4.70 3.10 - 9.50 x10 3/uL    Hgb 14.1 11.4 - 14.8 g/dL    Hematocrit 16.1 09.6 - 43.7 %    Platelets 230 142 - 346 x10 3/uL    RBC 4.75 3.90 - 5.10 x10 6/uL    MCV 91.2 78.0 - 96.0 fL    MCH 29.7 25.1 - 33.5 pg    MCHC 32.6 31.5 - 35.8 g/dL    RDW 14 11 - 15 %    MPV 9.6 8.9 - 12.5 fL    Neutrophils 58.9 None %    Lymphocytes Automated 29.8 None %    Monocytes 9.4 None %    Eosinophils Automated 1.1 None %    Basophils Automated 0.6 None %    Immature Granulocytes 0.2 None %    Nucleated RBC 0.0 0.0 - 0.0 /100 WBC    Neutrophils Absolute 2.77 1.10 - 6.33 x10 3/uL    Lymphocytes Absolute Automated 1.40 0.42 - 3.22 x10 3/uL    Monocytes Absolute Automated 0.44 0.21 - 0.85 x10 3/uL    Eosinophils Absolute Automated 0.05 0.00 - 0.44 x10 3/uL    Basophils Absolute Automated 0.03 0.00 - 0.08 x10 3/uL    Immature Granulocytes Absolute 0.01 0.00 - 0.07 x10 3/uL    Absolute NRBC 0.00 0.00 - 0.00 x10 3/uL   Lipase   Result Value Ref Range    Lipase 14 8 - 78 U/L   Hepatic function panel (LFT)   Result Value Ref Range    Bilirubin, Total 0.5 0.2 - 1.2 mg/dL    Bilirubin Direct 0.3 0.0 - 0.5 mg/dL    Bilirubin Indirect 0.2 0.2 - 1.0 mg/dL    AST (SGOT) 18 5 - 34 U/L    ALT 18 0 - 55 U/L    Alkaline Phosphatase 75 37 - 106 U/L    Protein, Total 6.5 6.0 - 8.3 g/dL    Albumin 4.2 3.5 - 5.0 g/dL    Globulin 2.3 2.0 - 3.6 g/dL    Albumin/Globulin Ratio 1.8 0.9 - 2.2   Beta HCG, Qual, Serum   Result Value Ref Range    Hcg Qualitative Negative Negative   GFR   Result Value Ref Range    EGFR >60.0    Comprehensive metabolic panel   Result Value Ref Range    Glucose 86 70 - 100 mg/dL    BUN 9.0 7.0 - 04.5 mg/dL    Creatinine 0.7 0.6 - 1.0 mg/dL    Calcium 8.9 8.5 - 40.9 mg/dL    Sodium 811 914 - 782 mEq/L    Potassium 3.9 3.5 - 5.1 mEq/L    Chloride 106 100 - 111 mEq/L    CO2 24 22 - 29 mEq/L    Anion Gap 12.0 5.0 - 15.0   Urinalysis Reflex to Microscopic  Exam- Reflex to Culture   Result Value Ref Range    Urine Type Urine, Clean Ca     Color, UA YELLOW Colorless - Yellow    Clarity, UA CLEAR  Clear - Hazy    Specific Gravity UA <=1.005 1.001 - 1.035    Urine pH 7.0 5.0 - 8.0    Leukocyte Esterase, UA NEGATIVE Negative    Nitrite, UA NEGATIVE Negative    Protein, UR NEGATIVE Negative    Glucose, UA NEGATIVE Negative    Ketones UA NEGATIVE Negative    Urobilinogen, UA 0.2 0.2 - 2.0 mg/dL    Bilirubin, UA NEGATIVE Negative    Blood, UA NEGATIVE Negative    WBC, UA None Seen 0 - 5 /hpf       ATTESTATIONS      Physician Attestation: Darlyn Read MD, have been the primary provider for Amber [name] during this Emergency Dept visit and have reviewed the chart for accuracy and agree with its content.     DIAGNOSIS      Diagnosis:  Final diagnoses:   Epigastric abdominal pain       Disposition:  ED Disposition     ED Disposition Condition Date/Time Comment    Discharge  Fri Dec 08, 2019  2:48 PM Amber [name] discharge to [name]/self care.    Condition at disposition: Stable          Prescriptions:  Patient's Medications   New Prescriptions    HYDROCODONE-ACETAMINOPHEN (NORCO) 5-325 MG PER TABLET    Take 1 tablet by mouth every 6 (six) hours as needed for Pain    NALOXONE (NARCAN) 4 MG/0.1ML NASAL SPRAY    1 spray intranasally. If pt does not respond or relapses into respiratory depression call 911. Give additional doses every 2-3 min.    ONDANSETRON (ZOFRAN-ODT) 4 MG DISINTEGRATING TABLET    Take 1 tablet (4 mg total) by mouth every 6 (six) hours as needed for Nausea    SUCRALFATE (CARAFATE) 1 G TABLET    Take 1 tablet (1 g total) by mouth 4 (four) times daily for 5 days   Previous Medications    ALENDRONATE (FOSAMAX) 70 MG TABLET    TAKE 1 TABLET BY MOUTH EVERY WEEK AS DIRECTED    ASPIRIN EC 81 MG EC TABLET    Take 1 tablet (81 mg total) by mouth daily.    ATENOLOL (TENORMIN) 50 MG TABLET    Take 1 tablet (50 mg total) by mouth daily    CLOTRIMAZOLE-BETAMETHASONE  (LOTRISONE) CREAM    Apply to affected area 2 times daily    CYANOCOBALAMIN (B-12 IJ)    Inject as directed once a week    DIAZEPAM (VALIUM) 2 MG TABLET    Take 1 tablet (2 mg total) by mouth every 8 (eight) hours as needed for Anxiety    FLUTICASONE (FLONASE) 50 MCG/ACT NASAL SPRAY    2 sprays by Nasal route daily.    MULTIPLE VITAMIN (MULTIVITAMIN) CAPSULE    Take 1 capsule by mouth daily    PANTOPRAZOLE (PROTONIX) 40 MG TABLET    Take 1 tablet (40 mg total) by mouth daily    SERTRALINE (ZOLOFT) 50 MG TABLET    Take 1/2 tab po daily x 1 wk, then 1 tab daily    VALACYCLOVIR HCL (VALTREX) 500 MG TABLET    TAKE 1 TABLET(500 MG) BY MOUTH DAILY    VITAMIN D, ERGOCALCIFEROL, (DRISDOL) 50000 UNIT CAP    Take 1 capsule (50,000 Units total) by mouth once a week   Modified Medications    No medications on file   Discontinued Medications    ONDANSETRON (ZOFRAN-ODT) 4 MG DISINTEGRATING TABLET  Take 1 tablet (4 mg total) by mouth every 8 (eight) hours as needed for Nausea              Georgana Curio, MD  12/08/19 1504

## 2019-12-08 NOTE — EDIE (Signed)
COLLECTIVE?NOTIFICATION?12/08/2019 12:48?Amber [name], Amber [name] F?MRN: [id number]    Criteria Met      5 ED Visits in 12 Months    Security and Safety  No recent Security Events currently on file    ED Care Guidelines  There are currently no ED Care Guidelines for this patient. Please check your facility's medical records system.    Flags      Negative COVID-19 Lab Result - [facility] - A specimen collected from this patient was negative for COVID-19 / Attributed By: [place] Department of Health / Attributed On: 12/02/2019           E.D. Visit Count (12 mo.)  Facility Visits   [facility] 5   [place] [facility]: [facility] 3   Total 8   Note: Visits indicate total known visits.     Recent Emergency Department Visit Summary  Date Facility [facility] Type Diagnoses or Chief Complaint   Dec 08, 2019 [place] [facility]: [facility] [place] [facility]      Nausea/Abdominal Pain      Nov 23, 2019 [facility] [name] [facility]      RS CHEST PAIN      CHEST PAIN ADULT      Epigastric pain      Personal history of other mental and behavioral disorders      2. Headache, unspecified      4. Chest pain, unspecified      5. Tension-type headache, unspecified, not intractable      7. Shortness of breath      8. Tachycardia, unspecified      9. Nausea      Sep 07, 2019 [facility] [place] [name] [facility]      NUMBNESS N LEGS, CHEST TIGHTNESS, NAUSEA      NUMBNESS      4. Anesthesia of skin      5. Diarrhea, unspecified      6. Dizziness and giddiness      7. Other chest pain      8. Nausea      9. Unspecified abdominal pain      10. Bariatric surgery status      11. Long term (current) use of aspirin      Jul 21, 2019 [place] [facility] [place] [name] [facility]      RS/CHEST PAIN      CHEST PAIN ADULT      Chest pain, unspecified      Jul 08, 2019 [facility] [place] [name] [facility]      RS PALPITAION      Chest pain, unspecified       CHEST PAIN ADULT ABDOMINAL PAIN NAUSEA ANXIETY      CHEST PAIN ADULT ABDOMINAL PAIN NAUSE      Apr 07, 2019 [place] [facility]: [facility] [place] [facility]      TRIAGE - Pain on Right Knee      Knee Pain      Pain in right knee      Pain in right lower leg      Apr 03, 2019 [facility] [place] [name] [facility]      RS/      CHEST PAIN ADULT      Chest pain, unspecified      Jan 02, 2019 [place] [facility]: [facility]  Emergency  Cough weakness      Flu like symptoms      Bronchitis, not specified as acute or chronic      Gastro-esophageal reflux disease without esophagitis          Recent Inpatient Visit Summary  Date Facility [facility] Type Diagnoses or Chief Complaint   Jul 22, 2019 [place] [facility] [place] [name] [facility]      CHEST PAIN, UNSPECIFIED TYPE      Chest pain, unspecified      Hypokalemia      CHEST PAIN ADULT      Jul 08, 2019 [facility] [place] [name] In[facility]      CHEST PAIN, UNSPECIFIED TYPE      Chest pain, unspecified      CHEST PAIN ADULT ABDOMINAL PAIN NAUSEA ANXIETY      1. Other chest pain      3. Essential (primary) hypertension      4. Contact with and (suspected) exposure to other viral communicable diseases      5. Gastro-esophageal reflux disease without esophagitis      6. Diaphragmatic hernia without obstruction or gangrene      7. Hyperlipidemia, unspecified      8. Old myocardial infarction          Care Team  There is not a care team on record at this time.   Collective Portal  This patient has registered at the [facility] Emergency Room: [facility] Emergency Department   For more information visit: [email or web address]     PLEASE NOTE:     1.   Any care recommendations and other clinical information are provided as guidelines or for historical purposes only, and providers should exercise their own clinical judgment when providing  care.    2.   You may only use this information for purposes of treatment, payment or health care operations activities, and subject to the limitations of applicable Collective Policies.    3.   You should consult directly with the organization that provided a care guideline or other clinical history with any questions about additional information or accuracy or completeness of information provided.    ? 2021 [place], [employer] - [email or web address]

## 2019-12-11 ENCOUNTER — Encounter (INDEPENDENT_AMBULATORY_CARE_PROVIDER_SITE_OTHER): Payer: Self-pay

## 2019-12-12 ENCOUNTER — Encounter (INDEPENDENT_AMBULATORY_CARE_PROVIDER_SITE_OTHER): Payer: Self-pay

## 2019-12-12 ENCOUNTER — Telehealth (INDEPENDENT_AMBULATORY_CARE_PROVIDER_SITE_OTHER): Payer: Self-pay | Admitting: Internal Medicine

## 2019-12-12 NOTE — Telephone Encounter (Signed)
Pt called requesting a callback in regards to PA for pantoprazole (PROTONIX) 40 MG tablet CB# [phone]

## 2019-12-13 ENCOUNTER — Encounter (INDEPENDENT_AMBULATORY_CARE_PROVIDER_SITE_OTHER): Payer: Self-pay | Admitting: Internal Medicine

## 2019-12-15 ENCOUNTER — Other Ambulatory Visit (INDEPENDENT_AMBULATORY_CARE_PROVIDER_SITE_OTHER): Payer: Self-pay

## 2019-12-15 NOTE — Progress Notes (Signed)
Care Plan Update    Goals:   See a GI for abdominal pain  Get BH therapy for anxiety    Nurse Navigator reached out to the pt to follow up on her progress with regards to the care plan. We talked about the following,    * GI  Pt said she went to her appointment on 2/2 with GI Dr.Razavi. She, however, had to go to the ER again on 2/5 d/t worsening epigastric abdominal pain. CT was negative of acute process and pt was sent home with prescription for Norco. Pt said she saw GI again yesterday and he suspects hernia so she was advised to see follow up with bariatric surgeon. Pt had bariatric surgery in 2016. Pt said she will schedule this follow up on Monday with Dr. Val Eagle, as clinic is already closed at this time. Pt stated that pain is still there almost all of the time though she is able to manage and do her regular activities. Pt said Norco has been helping alleviate pain but she tries to avoid taking it as much as possible. NN stressed the importance of scheduling appointment as soon as possible and pt verbalzied understanding. Reminded to go to urgent care or ER as appropriate.     * BH therapy  Pt previously said that she has been having anxiety attacks stemming from the sudden death from heart attack of the older of his two sons in August 2020. Pt was prescribed Zoloft by PCP but pt said she stopped taking d/t side effects. She does not want to try another psychotropic med at this time. She said she is coping well with anxiety and grief through prayers, talking to her other son, and keeping busy with school. We talked last time about BH therapy which pt said she can get for free through her school. Pt said today that she has signed up for therapy but has not scheduled an appointment yet. She plans to do this once schoolwork schedule gets better.     * BP    Pt said BP at home continues to run in the 110s-130s/70s-80s. She logs her results and brings it to her PCP and cardiology appointments. Positive reinforcement  provided by NN and encouraged pt to continue. NN has provided education on proper techniques in BP monitoring.     Pt stated that she continues to try to eat healthy. NN reminded pt to also start exercising regularly. Pt said she will try.     Requested for pt to call NN for any care management questions or needs in between NN's outreaches. Pt verbalized understanding and appreciation for the support provided. Will continue to follow.       ZOXWR-UEA Vivien Presto, RN BSN CCM  Patient Care Navigator  [facility]   Enterprise Care Management  [facility] [place], [place]  [address]  [place], [address]  [place], [place], [address]  [phone]

## 2019-12-19 ENCOUNTER — Encounter (INDEPENDENT_AMBULATORY_CARE_PROVIDER_SITE_OTHER): Payer: Self-pay

## 2019-12-19 ENCOUNTER — Encounter (INDEPENDENT_AMBULATORY_CARE_PROVIDER_SITE_OTHER): Payer: Self-pay | Admitting: Surgery

## 2019-12-19 ENCOUNTER — Ambulatory Visit (INDEPENDENT_AMBULATORY_CARE_PROVIDER_SITE_OTHER): Payer: Medicare Other | Admitting: Surgery

## 2019-12-19 VITALS — BP 167/103 | HR 55 | Temp 97.8°F | Ht 61.0 in | Wt 140.0 lb

## 2019-12-19 DIAGNOSIS — Z9884 Bariatric surgery status: Secondary | ICD-10-CM

## 2019-12-19 DIAGNOSIS — R10816 Epigastric abdominal tenderness: Secondary | ICD-10-CM

## 2019-12-22 NOTE — Progress Notes (Signed)
Drs. Fermin Schwab, [place], and [place]   [address]   [place], [place] [address]   [phone]   [phone]     [address]  [place], [place] [address]   [phone]   [phone]  Ms. Blust is s/p gastric bypass and then required laparoscopy with LOA due to adhesive band causing a pSBO at the [place].  She has been experiencing intermittent epigastric pain, RUQ pain, diffuse abdominal pain, etc.  Multiple CT's have been negative for any internal hernia or obstruction.  I reviewed her Op Note for laparoscopy and both mesenteric defects were well closed.  EGD last yr was essentially negative but a small hiatal hernia was seen incidentally.  However, Ms Sieverding continues to have symptoms.  Recent labs and CT were also negative.    CURRENT PROBLEM LIST:   Patient Active Problem List   Diagnosis   . Essential hypertension   . Anxiety state, unspecified   . Abnormal electrocardiogram   . Cerebral infarction   . Gastroesophageal reflux disease   . Hypernatremia   . Paresthesia   . Slow transit constipation   . Vitamin D deficiency   . Overactive bladder   . S/P laparoscopic cholecystectomy   . Transient cerebral ischemia, unspecified type   . Palpitations   . Pituitary microadenoma   . Dyslipidemia   . Other long term (current) drug therapy   . Headache   . History of spinal surgery   . Obstructive sleep apnea syndrome   . Hiatal hernia   . Osteoarthritis of knees, bilateral   . Bariatric surgery status   . Nutritional deficiency   . Anxiety   . Epigastric abdominal tenderness without rebound tenderness   . Encounter for vitamin deficiency screening   . Nausea   . Other dysphagia   . Small bowel obstruction due to adhesions   . Allergic rhinitis   . Chronic frontal sinusitis   . Chronic sphenoidal sinusitis   . Sinusitis   . Primary osteoarthritis of both knees   . Labile hypertension   . Postsurgical dumping syndrome     PAST MEDICAL HISTORY:   Past Medical History:    Diagnosis Date   . Asthma    . Bronchitis 01/02/2019   . Chest pain 2016     Chest pain after eating x6  months--being followed by GI for GERD   . Claustrophobia     MRIs   . Constipation    . Genital herpes     no current outbreak (10/03/15)   . GERD (gastroesophageal reflux disease)    . Hiatal hernia     h/o surgery   . Hyperlipidemia    . Hypertensive disorder     Well controlled on meds per pt. Elevated with pain.   . Low back pain    . Nausea without vomiting    . OSA on CPAP 2015    CPAP nightly x 1 yr, used nightly.   . Osteoarthritis of knees, bilateral     and back   . SBO (small bowel obstruction) 09/2018   . TIA (transient ischemic attack) 2014    TIA 2014-no residual. Patient evaulated for possible TIA 07/12/2015  @ [facility] (dischage summary in epic)-per summary CT of head was normal and patient was d/c'd     PAST SURGICAL HISTORY:   Past Surgical History:   Procedure Laterality Date   . APPENDECTOMY  >25 yrs   .  CHOLECYSTECTOMY     . EGD  01/2015   . EGD N/A 01/23/2019    Procedure: EGD;  Surgeon: Champ Mungo, MD;  Location: [facility];  Service: General;  Laterality: N/A;  ENDOSCOPY  MD REQ=30MINS; Q1=UNK   . EGD, BIOPSY N/A 09/15/2017    Procedure: EGD, BIOPSY;  Surgeon: Pershing Proud, MD;  Location: [facility];  Service: Gastroenterology;  Laterality: N/A;   . fallopian tube surgery  >25 yrs   . INSERTION, PAIN PUMP (MEDICAL) N/A 10/22/2015    Procedure: INSERTION, PAIN PUMP (MEDICAL);  Surgeon: Nicola Police, DO;  Location: [facility];  Service: General;  Laterality: N/A;   . LAPAROSCOPIC, CHOLECYSTECTOMY, CHOLANGIOGRAM  08/13/2014   . LAPAROSCOPIC, GASTRIC BYPASS N/A 10/22/2015    Procedure: LAPAROSCOPIC, GASTRIC BYPASS;  Surgeon: Jeanell Sparrow, Hamid R, DO;  Location: [facility];  Service: General;  Laterality: N/A;   . LAPAROSCOPIC, HERNIORRHAPHY, HIATAL N/A 10/22/2015    Procedure: LAPAROSCOPIC, HERNIORRHAPHY, HIATAL;  Surgeon: Nicola Police, DO;  Location: [facility];  Service: General;  Laterality: N/A;   . LAPAROSCOPIC, LYSIS, ADHESIONS N/A 09/01/2018    Procedure: LAPAROSCOPIC, LYSIS, ADHESIONS RELEASE OF  SMALL BOWEL OBSTRUCTION;  Surgeon: Champ Mungo, MD;  Location: [facility];  Service: General;  Laterality: N/A;   . LAPAROSCOPY, DIAGNOSTIC N/A 09/01/2018    Procedure: LAPAROSCOPY, DIAGNOSTIC;  Surgeon: Champ Mungo, MD;  Location: [facility];  Service: General;  Laterality: N/A;   . OVARY SURGERY Right >25 yrs   . SPINE SURGERY  11/2013    cervical HNP x 2, Dr. Bufford [facility]     CURRENT OUTPATIENT MEDICATIONS:   Outpatient Medications Marked as Taking for the 12/19/19 encounter (Office Visit) with Tommye Standard, MD   Medication Sig Dispense Refill   . alendronate (FOSAMAX) 70 MG tablet TAKE 1 TABLET BY MOUTH EVERY WEEK AS DIRECTED 12 tablet 1   . aspirin EC 81 MG EC tablet Take 1 tablet (81 mg total) by mouth daily. 30 tablet 1   . atenolol (Tenormin) 50 MG tablet Take 1 tablet (50 mg total) by mouth daily 90 tablet 3   . clotrimazole-betamethasone (LOTRISONE) cream Apply to affected area 2 times daily 45 g 1   . Cyanocobalamin (B-12 IJ) Inject as directed once a week     . diazePAM (VALIUM) 2 MG tablet Take 1 tablet (2 mg total) by mouth every 8 (eight) hours as needed for Anxiety 30 tablet 2   . fluticasone (FLONASE) 50 MCG/ACT nasal spray 2 sprays by Nasal route daily. (Patient taking differently: 2 sprays by Nasal route as needed.   ) 16 g 2   . Multiple Vitamin (MULTIVITAMIN) capsule Take 1 capsule by mouth daily     . ondansetron (ZOFRAN-ODT) 4 MG disintegrating tablet Take 1 tablet (4 mg total) by mouth every 6 (six) hours as needed for Nausea 8 tablet 0   . pantoprazole (PROTONIX) 40 MG tablet Take 1 tablet (40 mg total) by mouth daily 90 tablet 1   . valACYclovir HCL (VALTREX) 500 MG tablet TAKE 1 TABLET(500 MG) BY MOUTH DAILY (Patient taking differently: Take 500 mg by mouth as needed TAKE 1 TABLET(500 MG) BY MOUTH DAILY  ) 90 tablet 1   .  vitamin D, ergocalciferol, (DRISDOL) 50000 UNIT Cap Take 1 capsule (50,000 Units total) by mouth once a week 12 capsule 1     ALLERGIES:   Allergies   Allergen Reactions   .  Acyclovir Nausea And Vomiting     Other reaction(s): gi distress   . Amlodipine      nausea   . Amoxicillin-Pot Clavulanate      Other reaction(s): gi distress     . Aspirin Nausea And Vomiting     Patient states "it upsets my stomach"  Other reaction(s): gi distress  Upsets stomach  Able to tolerate enteric coated aspirin   . Atorvastatin        Palpitation   . Carvedilol      Extreme fatigue   . Lisinopril      nausea   . Lovastatin Nausea And Vomiting     Other reaction(s): gi distress   . Metronidazole Nausea And Vomiting     Other reaction(s): gi distress   . Moxifloxacin Nausea And Vomiting         GI symptoms   . Rosuvastatin      Palpitation, chest pain, abd pain    . Statins      Chest pain, palpitations.   . Sulfa Antibiotics Nausea And Vomiting     Other reaction(s): gi distress      Social History     Tobacco Use   . Smoking status: Never Smoker   . Smokeless tobacco: Never Used   Substance Use Topics   . Alcohol use: Never     Frequency: Never      Family History   Problem Relation Age of Onset   . Cancer Mother 50        breast cancer   . Breast cancer Mother    . Heart disease Father    . Malignant hyperthermia Neg Hx    . Pseudochol deficiency Neg Hx         Physical Exam:W/ female chaperone present during entire examination:  BP (!) 167/103 Comment: right arm: 162/100- pt reports normal readings at home  Pulse (!) 55   Temp 97.8 F (36.6 C) (Temporal)   Ht 5\' 1"    Wt 140 lb   BMI 26.45 kg/m     General appearance: alert, appears stated age and cooperative  Head: Normocephalic, without obvious abnormality, atraumatic  Eyes: negative findings: conjunctivae and sclerae normal  Abdomen: soft, non-tender; bowel sounds normal; no masses,  no organomegaly      Assessment:  Morbid obesity  Bariatric Status  Abdominal  pain    Plan:  S/p LaparoscopicRNY-Gastric Bypass. --We spoke in detail about food log. I recommend she f/u with the Dietician and our NP to further detail her food intake.  I am concerned her symptoms are more related to dumping than a mechanical issue.  I doubt the hiatal hernia is contributing much to her symptoms.      Thank you for allowing me to participate in their care.      Tommye Standard, MD, FACS

## 2019-12-25 ENCOUNTER — Other Ambulatory Visit (INDEPENDENT_AMBULATORY_CARE_PROVIDER_SITE_OTHER): Payer: Self-pay

## 2019-12-25 NOTE — Progress Notes (Signed)
12/25/19 1305   Pt Outreach/Care Plan Metrics   Outreach Status for Metrics listing Left Voicemail 1       Nurse Navigator called pt today to follow up on pt's progress with regards to care plan. Unable to reach pt at this time. VM left with this NN's contact information. Will continue to follow.    ZOXWR-UEA Vivien Presto, RN BSN CCM  Patient Care Navigator  [facility]   Enterprise Care Management  [facility] [place], [place]  [address]  [place], [address]  [place], [place], [address]  [phone]

## 2019-12-27 ENCOUNTER — Encounter (INDEPENDENT_AMBULATORY_CARE_PROVIDER_SITE_OTHER): Payer: Self-pay | Admitting: Internal Medicine

## 2019-12-29 ENCOUNTER — Telehealth (INDEPENDENT_AMBULATORY_CARE_PROVIDER_SITE_OTHER): Payer: Medicare Other

## 2019-12-29 DIAGNOSIS — Z6826 Body mass index (BMI) 26.0-26.9, adult: Secondary | ICD-10-CM

## 2019-12-29 DIAGNOSIS — Z09 Encounter for follow-up examination after completed treatment for conditions other than malignant neoplasm: Secondary | ICD-10-CM

## 2019-12-29 DIAGNOSIS — Z9884 Bariatric surgery status: Secondary | ICD-10-CM

## 2019-12-29 DIAGNOSIS — E663 Overweight: Secondary | ICD-10-CM

## 2019-12-29 DIAGNOSIS — Z713 Dietary counseling and surveillance: Secondary | ICD-10-CM

## 2019-12-29 NOTE — Progress Notes (Signed)
Verbal consent has been obtained from the patient to conduct a video visit encounter to minimize exposure to COVID-19: YES    S:  Amber [name] presents for nutrition evaluation post RNY (2016) up for concerns re: was in ED on 12/08/19 for abd pain and dry heaving. Was told to see GI. GI told her no acute findings, and to see Dr. Val Eagle. Always in pain, having a lot of stomach problems, has pain and nausea after a bowel movement. States she does dump at times, at other times, constipated. Not eating anything with more than 10g of sugar, following 30/30 rule. Does not always measure her portions, but stops before she is overly full.       Pt taking MVI, vitamin D 50,000IU once a week, calcium chew made her nauseated (BA brand, supposed to take twice daily), Getting b12 injections once a month, needed three iron infusions last year.    Pt consuming 0 grams protein daily from a supplemental source.  She is not sure how much clear fluid she drinks daily.      Diet Recall:   Breakfast: 8am one packet of plain oatmeal, possibly a couple slices of banana.   Lunch: 12pm [place] sandwich or tuna sandwich, or leftover dinner. Possibly side of fruit or cheese. Sometimes a cup of healthy choice soup, low sodium.    Dinner: 4pm [place] sandwich and fruit. Baked chicken, baked fish, chicken and rice, homemade chicken soup are also in the rotation.   Snacks: hour after breakfast may have some apple or orange slices.   Beverages: just water. Decaf tea or coffee.   Alcohol: none.    Finds if she doesn't eat on schedule, she gets stomach cramps, gas, feels sick. If she eats too late, she wakes up sick in the morning. Tried activia shots for probiotics, but seemed to upset her stomach.    For physical activity, pt reports participating in:  Not reviewed this visit.      O:  Today's Wt:   140lbs    Previous Visit Wt:    Wt Readings from Last 10 Encounters:   12/19/19 140 lb   12/08/19 142 lb   11/15/19 143 lb   10/04/19 143 lb   09/21/19  142 lb 12.8 oz   09/07/19 143 lb   08/24/19 142 lb   07/27/19 142 lb 12.8 oz   07/24/19 145 lb 9.6 oz   05/22/19 135 lb     BMI:  There is no height or weight on file to calculate BMI.    Surgery Date:  10/22/15  Pertinent Lab Values:  n/a    Past Medical History:   Diagnosis Date   . Asthma    . Bronchitis 01/02/2019   . Chest pain 2016     Chest pain after eating x6  months--being followed by GI for GERD   . Claustrophobia     MRIs   . Constipation    . Genital herpes     no current outbreak (10/03/15)   . GERD (gastroesophageal reflux disease)    . Hiatal hernia     h/o surgery   . Hyperlipidemia    . Hypertensive disorder     Well controlled on meds per pt. Elevated with pain.   . Low back pain    . Nausea without vomiting    . OSA on CPAP 2015    CPAP nightly x 1 yr, used nightly.   . Osteoarthritis of knees, bilateral  and back   . SBO (small bowel obstruction) 09/2018   . TIA (transient ischemic attack) 2014    TIA 2014-no residual. Patient evaulated for possible TIA 07/12/2015  @ [facility] (dischage summary in epic)-per summary CT of head was normal and patient was d/c'd     Past Surgical History:   Procedure Laterality Date   . APPENDECTOMY  >25 yrs   . CHOLECYSTECTOMY     . EGD  01/2015   . EGD N/A 01/23/2019    Procedure: EGD;  Surgeon: Champ Mungo, MD;  Location: [facility];  Service: General;  Laterality: N/A;  ENDOSCOPY  MD REQ=30MINS; Q1=UNK   . EGD, BIOPSY N/A 09/15/2017    Procedure: EGD, BIOPSY;  Surgeon: Pershing Proud, MD;  Location: [facility];  Service: Gastroenterology;  Laterality: N/A;   . fallopian tube surgery  >25 yrs   . INSERTION, PAIN PUMP (MEDICAL) N/A 10/22/2015    Procedure: INSERTION, PAIN PUMP (MEDICAL);  Surgeon: Nicola Police, DO;  Location: [facility];  Service: General;  Laterality: N/A;   . LAPAROSCOPIC, CHOLECYSTECTOMY, CHOLANGIOGRAM  08/13/2014   . LAPAROSCOPIC, GASTRIC BYPASS N/A 10/22/2015    Procedure: LAPAROSCOPIC, GASTRIC BYPASS;  Surgeon:  Jeanell Sparrow, Hamid R, DO;  Location: [facility];  Service: General;  Laterality: N/A;   . LAPAROSCOPIC, HERNIORRHAPHY, HIATAL N/A 10/22/2015    Procedure: LAPAROSCOPIC, HERNIORRHAPHY, HIATAL;  Surgeon: Nicola Police, DO;  Location: [facility];  Service: General;  Laterality: N/A;   . LAPAROSCOPIC, LYSIS, ADHESIONS N/A 09/01/2018    Procedure: LAPAROSCOPIC, LYSIS, ADHESIONS RELEASE OF  SMALL BOWEL OBSTRUCTION;  Surgeon: Champ Mungo, MD;  Location: [facility];  Service: General;  Laterality: N/A;   . LAPAROSCOPY, DIAGNOSTIC N/A 09/01/2018    Procedure: LAPAROSCOPY, DIAGNOSTIC;  Surgeon: Champ Mungo, MD;  Location: [facility];  Service: General;  Laterality: N/A;   . OVARY SURGERY Right >25 yrs   . SPINE SURGERY  11/2013    cervical HNP x 2, Dr. Bufford Buttner       Allergies:    Allergies   Allergen Reactions   . Acyclovir Nausea And Vomiting     Other reaction(s): gi distress   . Amlodipine      nausea   . Amoxicillin-Pot Clavulanate      Other reaction(s): gi distress     . Aspirin Nausea And Vomiting     Patient states "it upsets my stomach"  Other reaction(s): gi distress  Upsets stomach  Able to tolerate enteric coated aspirin   . Atorvastatin        Palpitation   . Carvedilol      Extreme fatigue   . Lisinopril      nausea   . Lovastatin Nausea And Vomiting     Other reaction(s): gi distress   . Metronidazole Nausea And Vomiting     Other reaction(s): gi distress   . Moxifloxacin Nausea And Vomiting         GI symptoms   . Rosuvastatin      Palpitation, chest pain, abd pain    . Statins      Chest pain, palpitations.   . Sulfa Antibiotics Nausea And Vomiting     Other reaction(s): gi distress       A:  Per diet recall pt is not consuming many foods that are high in added sugars (although activia shots are higher in sugar, and were not well-tolerated).   Food choices  are fair, could benefit from increased protein.  We reviewed suggestions to address dietary fiber and protein. She is  following 30/30 rule (although does eat soup), and limits her portions. Her weight has remained stable s/p WLS. Food symptom diary may help identify if her GI issues are dietary in nature.     Pt is motivated to continue exercising regularly and following recommended dietary guidelines to continue and then maintain weight loss.    P:  1.  Pt to trial low sugar Greek yogurts for added probiotics and protein.  2. Keep two week food/symptom log for review at next visit.   3. Follow up in two weeks.    Spent a total of 30 minutes educating pt in a individual one-on-one setting.  Plan reviewed with surgeon.

## 2020-01-01 ENCOUNTER — Other Ambulatory Visit (INDEPENDENT_AMBULATORY_CARE_PROVIDER_SITE_OTHER): Payer: Self-pay

## 2020-01-01 NOTE — Progress Notes (Signed)
01/01/20 1405   Pt Outreach/Care Plan Metrics   Outreach Status for Metrics listing Left Voicemail 2       Nurse Navigator called pt today to follow up on pt's progress with regards to care plan. Unable to reach pt at this time. VM left with this NN's contact information. Will continue to follow.    ZOXWR-UEA Vivien Presto, RN BSN CCM  Patient Care Navigator  [facility]   Enterprise Care Management  [facility] [place], [place]  [address]  [place], [address]  [place], [place], [address]  [phone]

## 2020-01-08 ENCOUNTER — Encounter (INDEPENDENT_AMBULATORY_CARE_PROVIDER_SITE_OTHER): Payer: Self-pay

## 2020-01-09 ENCOUNTER — Encounter (INDEPENDENT_AMBULATORY_CARE_PROVIDER_SITE_OTHER): Payer: Self-pay

## 2020-01-12 ENCOUNTER — Encounter (INDEPENDENT_AMBULATORY_CARE_PROVIDER_SITE_OTHER): Payer: Self-pay

## 2020-01-12 ENCOUNTER — Other Ambulatory Visit (INDEPENDENT_AMBULATORY_CARE_PROVIDER_SITE_OTHER): Payer: Self-pay | Admitting: Internal Medicine

## 2020-01-12 ENCOUNTER — Ambulatory Visit (INDEPENDENT_AMBULATORY_CARE_PROVIDER_SITE_OTHER): Payer: Medicare Other | Admitting: Internal Medicine

## 2020-01-15 ENCOUNTER — Other Ambulatory Visit (HOSPITAL_BASED_OUTPATIENT_CLINIC_OR_DEPARTMENT_OTHER): Payer: Self-pay

## 2020-01-24 ENCOUNTER — Other Ambulatory Visit (INDEPENDENT_AMBULATORY_CARE_PROVIDER_SITE_OTHER): Payer: Self-pay

## 2020-01-24 NOTE — Progress Notes (Signed)
01/24/20 1041   Pt Outreach/Care Plan Metrics   Outreach Status for Metrics listing Left Voicemail 3       Nurse Navigator called pt today to follow up on pt's progress with regards to care plan. Unable to reach pt at this time. VM left with this NN's contact information. Will continue to follow.    NWGNF-AOZ Vivien Presto, RN BSN CCM  Patient Care Navigator  [facility]   Enterprise Care Management  [facility] [place], [place]  [address]  [place], [address]  [place], [place], [address]  [phone]

## 2020-02-07 ENCOUNTER — Other Ambulatory Visit (INDEPENDENT_AMBULATORY_CARE_PROVIDER_SITE_OTHER): Payer: Self-pay

## 2020-02-07 NOTE — Progress Notes (Signed)
02/07/20 1028   Pt Outreach/Care Plan Metrics   Outreach Status for Metrics listing Left Voicemail 4;Unable to Reach  (unenrolled)       Nurse Navigator called pt today to follow up on pt's progress with regards to care plan and to check on how she is doing. Unable to reach pt after 4 outreach attempts. Pt unenrolled from care management d/t not being able to be reached. VM left with this NN's contact information and encouragement to call for any care management questions or needs.        Maryclare Labrador   [phone]

## 2020-02-08 ENCOUNTER — Encounter (INDEPENDENT_AMBULATORY_CARE_PROVIDER_SITE_OTHER): Payer: Self-pay

## 2020-02-09 ENCOUNTER — Encounter (INDEPENDENT_AMBULATORY_CARE_PROVIDER_SITE_OTHER): Payer: Self-pay

## 2020-03-01 ENCOUNTER — Encounter (INDEPENDENT_AMBULATORY_CARE_PROVIDER_SITE_OTHER): Payer: Self-pay | Admitting: Internal Medicine

## 2020-03-01 ENCOUNTER — Other Ambulatory Visit: Payer: Self-pay | Admitting: Hematology & Oncology

## 2020-03-01 ENCOUNTER — Other Ambulatory Visit (INDEPENDENT_AMBULATORY_CARE_PROVIDER_SITE_OTHER): Payer: Self-pay | Admitting: Internal Medicine

## 2020-03-01 DIAGNOSIS — A6000 Herpesviral infection of urogenital system, unspecified: Secondary | ICD-10-CM

## 2020-03-08 MED ORDER — VALACYCLOVIR HCL 500 MG PO TABS
ORAL_TABLET | ORAL | 1 refills | Status: DC
Start: 2020-03-08 — End: 2021-08-27

## 2020-03-09 ENCOUNTER — Encounter (INDEPENDENT_AMBULATORY_CARE_PROVIDER_SITE_OTHER): Payer: Self-pay

## 2020-03-10 ENCOUNTER — Encounter (INDEPENDENT_AMBULATORY_CARE_PROVIDER_SITE_OTHER): Payer: Self-pay

## 2020-03-19 ENCOUNTER — Encounter (INDEPENDENT_AMBULATORY_CARE_PROVIDER_SITE_OTHER): Payer: Self-pay | Admitting: Family Medicine

## 2020-03-19 ENCOUNTER — Ambulatory Visit (INDEPENDENT_AMBULATORY_CARE_PROVIDER_SITE_OTHER): Payer: Medicare Other | Admitting: Family Medicine

## 2020-03-19 VITALS — BP 119/78 | HR 62 | Temp 98.0°F | Ht 61.0 in | Wt 136.0 lb

## 2020-03-19 DIAGNOSIS — I10 Essential (primary) hypertension: Secondary | ICD-10-CM

## 2020-03-19 DIAGNOSIS — K529 Noninfective gastroenteritis and colitis, unspecified: Secondary | ICD-10-CM

## 2020-03-19 LAB — CBC AND DIFFERENTIAL
Absolute NRBC: 0 10*3/uL (ref 0.00–0.00)
Basophils Absolute Automated: 0.02 10*3/uL (ref 0.00–0.08)
Basophils Automated: 0.3 %
Eosinophils Absolute Automated: 0.19 10*3/uL (ref 0.00–0.44)
Eosinophils Automated: 3 %
Hematocrit: 45.2 % — ABNORMAL HIGH (ref 34.7–43.7)
Hgb: 13.9 g/dL (ref 11.4–14.8)
Immature Granulocytes Absolute: 0.01 10*3/uL (ref 0.00–0.07)
Immature Granulocytes: 0.2 %
Lymphocytes Absolute Automated: 0.87 10*3/uL (ref 0.42–3.22)
Lymphocytes Automated: 13.8 %
MCH: 29 pg (ref 25.1–33.5)
MCHC: 30.8 g/dL — ABNORMAL LOW (ref 31.5–35.8)
MCV: 94.4 fL (ref 78.0–96.0)
MPV: 10.1 fL (ref 8.9–12.5)
Monocytes Absolute Automated: 0.63 10*3/uL (ref 0.21–0.85)
Monocytes: 10 %
Neutrophils Absolute: 4.57 10*3/uL (ref 1.10–6.33)
Neutrophils: 72.7 %
Nucleated RBC: 0 /100 WBC (ref 0.0–0.0)
Platelets: 278 10*3/uL (ref 142–346)
RBC: 4.79 10*6/uL (ref 3.90–5.10)
RDW: 14 % (ref 11–15)
WBC: 6.29 10*3/uL (ref 3.10–9.50)

## 2020-03-19 LAB — COMPREHENSIVE METABOLIC PANEL
ALT: 14 U/L (ref 0–55)
AST (SGOT): 18 U/L (ref 5–34)
Albumin/Globulin Ratio: 1.4 (ref 0.9–2.2)
Albumin: 4 g/dL (ref 3.5–5.0)
Alkaline Phosphatase: 87 U/L (ref 37–106)
Anion Gap: 9 (ref 5.0–15.0)
BUN: 11 mg/dL (ref 7.0–19.0)
Bilirubin, Total: 0.3 mg/dL (ref 0.2–1.2)
CO2: 27 mEq/L (ref 21–29)
Calcium: 9.4 mg/dL (ref 8.5–10.5)
Chloride: 105 mEq/L (ref 100–111)
Creatinine: 0.7 mg/dL (ref 0.4–1.5)
Globulin: 2.8 g/dL (ref 2.0–3.7)
Glucose: 96 mg/dL (ref 70–100)
Potassium: 4 mEq/L (ref 3.5–5.1)
Protein, Total: 6.8 g/dL (ref 6.0–8.3)
Sodium: 141 mEq/L (ref 136–145)

## 2020-03-19 LAB — HEMOLYSIS INDEX: Hemolysis Index: 11 (ref 0–18)

## 2020-03-19 LAB — GFR: EGFR: >60

## 2020-03-19 MED ORDER — HYDROCHLOROTHIAZIDE 12.5 MG PO CAPS
12.50 mg | ORAL_CAPSULE | Freq: Every day | ORAL | 0 refills | Status: DC
Start: 2020-03-19 — End: 2020-05-09

## 2020-03-19 NOTE — Patient Instructions (Signed)
Noninfectious Gastroenteritis (Adult)    Gastroenteritis can cause nausea, vomiting, diarrhea, and cramping in the belly. This may occur from food sensitivity, inflammation of your gastrointestinal tract, medicines, stress, or other causes not related to infection. Your symptoms will usually last from 1 to 3 days, but can last longer. Antibiotics are not effective, but simple home treatment will be helpful.  Home care  Medicine  · You may use acetaminophen or NSAID medicines like ibuprofen or naproxen to control fever, unless another medicine is prescribed. (Note: If you have chronic liver or kidney disease, or ever had a stomach ulcer or gastrointestinaI bleeding, talk with your healthcare provider before using these medicines.) Aspirin should never be used in anyone under 18 years of age who is ill with a fever. It may cause severe liver damage. Don't increase your NSAID medicines if you are already taking these medicines for another condition (like arthritis). Don't use NSAIDS if you are on aspirin (such as for heart disease, or after a stroke).  · If medicines for diarrhea or vomiting are prescribed, take only as directed.  General care and preventing spread of the illness  · If symptoms are severe, rest at home for the next 24 hours or until you feel better.  · Hand washing with soap and water is the best way to prevent the spread of infection. Wash your hands after touching anyone who is sick.  · Wash your hands after using the toilet and before meals. Clean the toilet after each use.  · Caffeine, tobacco, and alcohol can make your diarrhea, cramping, and pain worse.  Diet  · Water and clear liquids are important so you do not get dehydrated. Drink a small amount at a time.  · Don't force yourself to eat, especially if you have cramps, vomiting, or diarrhea. When you finally decide to start eating, do not eat large amounts at a time, even if you are hungry.  · If you eat, avoid fatty, greasy, spicy, or fried  foods.  · Don't eat dairy products if you have diarrhea; they can make the diarrhea worse.  During the first 24 hours (the first full day), follow the diet below:  · Beverages: Water, clear liquids, soft drinks without caffeine, like ginger ale; mineral water (plain or flavored); decaffeinated tea and coffee.  · Soups: Clear broth, consommé, and bouillon sports drinks aren't a good choice because they have too much sugar and not enough electrolytes. In this case, commercially available products called oral rehydration solutions are best.  · Desserts: Plain gelatin, ice pops, and fruit juice bars  During the next 24 hours (the second day), you may add the following to the above if you have improved. If not, continue what you did the first day:  · Hot cereal, plain toast, bread, rolls, crackers  · Plain noodles, rice, mashed potatoes, chicken noodle or rice soup  · Unsweetened canned fruit and bananas (don't eat pineapple or citrus)  · Limit caffeine and chocolate. No spices or seasonings except salt.  During the next 24 hours  · Gradually resume a normal diet, as you feel better and your symptoms improve.  · If at any time your symptoms start getting worse, go back to clear liquids until you feel better.    Food preparation  · If you have diarrhea, you should not prepare food for others. When you  prepare food for yourself, wash your hands before and after.  · Wash your hands after using cutting boards, countertops, and knives that have been in contact with raw food.  ·   Keep uncooked meats away from cooked and ready-to-eat foods.    Follow-up care  Follow up with your healthcare provider if you are not improving over the next 2 to 3 days, or as advised. If a stool (diarrhea) sample was taken, call for the results as directed.  Call 911  Call 911 if any of these occur:  · Trouble breathing  · Chest pain  · Confusion  · Severe drowsiness or trouble awakening  · Seizure  · Stiff neck  When to seek medical advice  Call  your healthcare provider right away if any of these occur:   · Increasing belly pain or constant lower right belly pain  · Continued vomiting (unable to keep liquids down)  · Frequent diarrhea (more than 5 times a day)  · Blood in vomit or stool (black or red color)  · Inability to tolerate solid food after a few days.  · Dark urine, reduced urine output  · Weakness, dizziness  · Drowsiness  · Fever of 100.4ºF (38.0ºC) or higher, or as directed by your healthcare provider  · New rash  [employer] last reviewed this educational content on 12/31/2016  © 2000-2020 [employer] [address], [place] [address]. All rights reserved. This information is not intended as a substitute for professional medical care. Always follow your healthcare professional's instructions.

## 2020-03-19 NOTE — Progress Notes (Signed)
Subjective:    Date: 03/19/2020 10:58 AM   Patient ID: Amber [name] is a 69 y.o. female.    Chief Complaint   Patient presents with   . Diarrhea     4 days   . Dizziness   . Abdominal Pain     RUQ     HPI   Pt c/o diarrhea since Saturday 3 days ago as well as feeling weak and nauseous  +Abdominal pain right upper quadrant and epigastric.     Diarrhea  Patient complains of diarrhea. Onset of diarrhea was 4 days ago. Diarrhea is occurring approximately 6 times per day. Patient describes diarrhea as watery. Diarrhea has been associated with abdominal pain described as aching. Patient denies blood in stool, fever, illness in household contacts, recent antibiotic use, recent travel, significant abdominal pain. Previous visits for diarrhea: none. Evaluation to date: none. Treatment to date: none    Pt denies prior similar episodes.     Pt denies etoh consumption    Of note, pt was seen at [facility] ED on 03/11/2020 for chest pain and elevated blood pressure.  ED records reviewed.  Pt had CT of Chest/A/P    Result Impression      1. No evidence of pulmonary embolism.  2. Mild atelectasis at the lung bases.  3. No acute process in the abdomen and pelvis.  4. Remaining chronic findings as described above.        Reading location: [place], [place].    Signed By: Johna Roles, MD on 03/11/2020 10:15 PM     hctz was started for elevated blood pressure. Pt reports her BP has improved and she is tolerating this new medication without adverse effects    No problems updated.    Patient Active Problem List   Diagnosis   . Essential hypertension   . Anxiety state, unspecified   . Abnormal electrocardiogram   . Cerebral infarction   . Gastroesophageal reflux disease   . Hypernatremia   . Paresthesia   . Slow transit constipation   . Vitamin D deficiency   . Overactive bladder   . S/P laparoscopic cholecystectomy   . Transient cerebral ischemia, unspecified type   . Palpitations   . Pituitary microadenoma   . Dyslipidemia    . Other long term (current) drug therapy   . Headache   . History of spinal surgery   . Obstructive sleep apnea syndrome   . Hiatal hernia   . Osteoarthritis of knees, bilateral   . Bariatric surgery status   . Nutritional deficiency   . Anxiety   . Epigastric abdominal tenderness without rebound tenderness   . Encounter for vitamin deficiency screening   . Nausea   . Other dysphagia   . Small bowel obstruction due to adhesions   . Allergic rhinitis   . Chronic frontal sinusitis   . Chronic sphenoidal sinusitis   . Sinusitis   . Primary osteoarthritis of both knees   . Labile hypertension   . Postsurgical dumping syndrome     Patient Care Team:  Oneita Jolly, MD as PCP - General (Internal Medicine)  Darnelle Maffucci, MD as Consulting Physician (Cardiology)  Brett Canales, MD as Consulting Physician (Gastroenterology)  Renard Hamper, MD as Consulting Physician (Clinical Cardiac Electrophysiology)  Vedia Coffer, MD as Consulting Physician (Critical Care Medicine)  Nicola Police, DO as Consulting Physician (Surgery)  Press, Tyler Deis, MD as Consulting Physician (Orthopaedic Surgery)  Champ Mungo I,  MD as Consulting Physician (Surgery)    Immunization History   Administered Date(s) Administered   . COVID-19 mRNA Vaccine Preservative Free 0.3 mL (PFIZER) 02/06/2020, 02/27/2020   . INFLUENZA QUADRIVALENT ADJUVANTED PF 65 YRS & OLDER (FLUAD) 12/08/2019   . Influenza Vacc QUAD Recombinant PF 66yrs & up 11/28/2018   . Influenza quadrivalent (IM) PF 3 Yrs & greater 11/03/2016   . Pneumococcal Conjugate 13-Valent 11/28/2018   . Td 02/26/1999     Current Outpatient Medications   Medication Sig Dispense Refill   . alendronate (FOSAMAX) 70 MG tablet TAKE 1 TABLET BY MOUTH EVERY WEEK AS DIRECTED 12 tablet 1   . aspirin EC 81 MG EC tablet Take 1 tablet (81 mg total) by mouth daily. 30 tablet 1   . atenolol (Tenormin) 50 MG tablet Take 1 tablet (50 mg total) by mouth daily 90 tablet 3   .  clotrimazole-betamethasone (LOTRISONE) cream Apply to affected area 2 times daily 45 g 1   . Cyanocobalamin (B-12 IJ) Inject as directed once a week     . diazePAM (VALIUM) 2 MG tablet Take 1 tablet (2 mg total) by mouth every 8 (eight) hours as needed for Anxiety 30 tablet 2   . fluticasone (FLONASE) 50 MCG/ACT nasal spray 2 sprays by Nasal route daily. (Patient taking differently: 2 sprays by Nasal route as needed.   ) 16 g 2   . hydroCHLOROthiazide (MICROZIDE) 12.5 MG capsule Take 12.5 mg by mouth daily     . Multiple Vitamin (MULTIVITAMIN) capsule Take 1 capsule by mouth daily     . ondansetron (ZOFRAN-ODT) 4 MG disintegrating tablet Take 1 tablet (4 mg total) by mouth every 6 (six) hours as needed for Nausea 8 tablet 0   . pantoprazole (PROTONIX) 40 MG tablet Take 1 tablet (40 mg total) by mouth daily 90 tablet 1   . valACYclovir HCL (VALTREX) 500 MG tablet TAKE 1 TABLET(500 MG) BY MOUTH DAILY 90 tablet 1   . vitamin D, ergocalciferol, (DRISDOL) 50000 UNIT Cap Take 1 capsule (50,000 Units total) by mouth once a week 12 capsule 1     No current facility-administered medications for this visit.     Allergies   Allergen Reactions   . Acyclovir Nausea And Vomiting     Other reaction(s): gi distress   . Amlodipine      nausea   . Amoxicillin-Pot Clavulanate      Other reaction(s): gi distress     . Aspirin Nausea And Vomiting     Patient states "it upsets my stomach"  Other reaction(s): gi distress  Upsets stomach  Able to tolerate enteric coated aspirin   . Atorvastatin        Palpitation   . Carvedilol      Extreme fatigue   . Lisinopril      nausea   . Lovastatin Nausea And Vomiting     Other reaction(s): gi distress   . Metronidazole Nausea And Vomiting     Other reaction(s): gi distress   . Moxifloxacin Nausea And Vomiting         GI symptoms   . Rosuvastatin      Palpitation, chest pain, abd pain    . Statins      Chest pain, palpitations.   . Sulfa Antibiotics Nausea And Vomiting     Other reaction(s): gi  distress       Past Medical History:   Diagnosis Date   . Asthma    .  Bronchitis 01/02/2019   . Chest pain 2016     Chest pain after eating x6  months--being followed by GI for GERD   . Claustrophobia     MRIs   . Constipation    . Genital herpes     no current outbreak (10/03/15)   . GERD (gastroesophageal reflux disease)    . Hiatal hernia     h/o surgery   . Hyperlipidemia    . Hypertensive disorder     Well controlled on meds per pt. Elevated with pain.   . Low back pain    . Nausea without vomiting    . OSA on CPAP 2015    CPAP nightly x 1 yr, used nightly.   . Osteoarthritis of knees, bilateral     and back   . SBO (small bowel obstruction) 09/2018   . TIA (transient ischemic attack) 2014    TIA 2014-no residual. Patient evaulated for possible TIA 07/12/2015  @ [facility] (dischage summary in epic)-per summary CT of head was normal and patient was d/c'd     Past Surgical History:   Procedure Laterality Date   . APPENDECTOMY  >25 yrs   . CHOLECYSTECTOMY     . EGD  01/2015   . EGD N/A 01/23/2019    Procedure: EGD;  Surgeon: Champ Mungo, MD;  Location: [facility];  Service: General;  Laterality: N/A;  ENDOSCOPY  MD REQ=30MINS; Q1=UNK   . EGD, BIOPSY N/A 09/15/2017    Procedure: EGD, BIOPSY;  Surgeon: Pershing Proud, MD;  Location: [facility];  Service: Gastroenterology;  Laterality: N/A;   . fallopian tube surgery  >25 yrs   . INSERTION, PAIN PUMP (MEDICAL) N/A 10/22/2015    Procedure: INSERTION, PAIN PUMP (MEDICAL);  Surgeon: Nicola Police, DO;  Location: [facility];  Service: General;  Laterality: N/A;   . LAPAROSCOPIC, CHOLECYSTECTOMY, CHOLANGIOGRAM  08/13/2014   . LAPAROSCOPIC, GASTRIC BYPASS N/A 10/22/2015    Procedure: LAPAROSCOPIC, GASTRIC BYPASS;  Surgeon: Jeanell Sparrow, Hamid R, DO;  Location: [facility];  Service: General;  Laterality: N/A;   . LAPAROSCOPIC, HERNIORRHAPHY, HIATAL N/A 10/22/2015    Procedure: LAPAROSCOPIC, HERNIORRHAPHY, HIATAL;  Surgeon: Nicola Police, DO;   Location: [facility];  Service: General;  Laterality: N/A;   . LAPAROSCOPIC, LYSIS, ADHESIONS N/A 09/01/2018    Procedure: LAPAROSCOPIC, LYSIS, ADHESIONS RELEASE OF  SMALL BOWEL OBSTRUCTION;  Surgeon: Champ Mungo, MD;  Location: [facility];  Service: General;  Laterality: N/A;   . LAPAROSCOPY, DIAGNOSTIC N/A 09/01/2018    Procedure: LAPAROSCOPY, DIAGNOSTIC;  Surgeon: Champ Mungo, MD;  Location: [facility];  Service: General;  Laterality: N/A;   . OVARY SURGERY Right >25 yrs   . SPINE SURGERY  11/2013    cervical HNP x 2, Dr. Bufford Buttner       Family History   Problem Relation Age of Onset   . Cancer Mother 9        breast cancer   . Breast cancer Mother    . Heart disease Father    . Malignant hyperthermia Neg Hx    . Pseudochol deficiency Neg Hx        Social History     Socioeconomic History   . Marital status: Single     Spouse name: Not on file   . Number of children: 2   . Years of education: Not on file   . Highest education level: Not on file  Occupational History   . Occupation: Acupuncturist: [employer]    Tobacco Use   . Smoking status: Never Smoker   . Smokeless tobacco: Never Used   Substance and Sexual Activity   . Alcohol use: Never   . Drug use: Never   . Sexual activity: Never     Birth control/protection: Post-menopausal   Other Topics Concern   . Dietary supplements / vitamins Yes   . Anesthesia problems No   . Blood thinners No   . Pregnant Not Asked   . Future Children Not Asked   . Number of Pregnancies? Not Asked   . Number of children Not Asked   . Miscarriages / Abortions? Not Asked   . Eats large amounts No   . Excessive Sweets No   . Skips meals No   . Eats excessive starches Yes   . Snacks or grazes No   . Emotional eater Yes   . Eats fried food Yes   . Eats fast food Yes   . Diet Center No   . HMR No   . [name] No   . [place] Weight Loss No   . Nutri-System No   . Opti-Fast / Medi-Fast No   . Overeaters Anonymous No   . Physicians Weight  Loss Center No   . TOPS No   . Weight Watchers No   . Atkins No   . Binging / Purging No   . Body for Life No   . Cabbage Soup No   . Calorie Counting No   . Fasting No   . [name] No   . Health Spa No   . Herbal Life No   . High Protein No   . Low Carb No   . Low Fat No   . Mayo [facility] Diet No   . [place] Diet No   . Margie Billet Diet No   . [place] Diet No   . Slim Fast No   . [place] No   . Sugar Busters No   . Vomiting No   . Zone Diet No   . Stationary cycle or treadmill No   . Gym/fitness Classes No   . Home exercise/video No   . Swimming No   . Team sports No   . Weight training No   . Walking or running No   . Hospitalization No   . Hypnosis No   . Physical therapy No   . Psychological therapy No   . Residential program No   . Acutrim No   . Amphetamines No   . Anorex No   . Byetta No   . Dexatrim No   . Didrex No   . Fastin No   . Fen - Phen No   . Ionamin / Adipex No   . Mazanor No   . Meridia No   . Obalan No   . Phendiet No   . Phentrol No   . Phentermine Yes   . Plegine No   . Pondimin No   . Qsymia No   . Prozac No   . Redux No   . Sanorex No   . Tenuate No   . Tepanole No   . Wechless No   . Wellbutrin No   . Xenical (Orlistat, Alli) No   . Other Med No   . No impairment Yes     Comment: Chronic Bilat Knee Pain   .  Walks with cane/crutch Yes   . Requires a wheelchair No   . Bedridden No   . Are you currently being treated for depression? No   . Do you snore? Yes   . Are you receiving any medical or psychological services? No   . Do you ever wake up at night gasping for breath? Not Asked   . Do you have or have you been treated for an eating disorder? No   . Anyone ever told you that you stop breathing while asleep? Not Asked   . Do you exercise regularly? No   . Have you or family member ever have trouble with anesthesia? No   Social History Narrative   . Not on file     Social Determinants of Health     Financial Resource Strain:    . Difficulty of Paying Living Expenses:    Food  Insecurity:    . Worried About Programme researcher, broadcasting/film/video in the Last Year:    . Barista in the Last Year:    Transportation Needs:    . Freight forwarder (Medical):    Marland Kitchen Lack of Transportation (Non-Medical):    Physical Activity:    . Days of Exercise per Week:    . Minutes of Exercise per Session:    Stress:    . Feeling of Stress :    Social Connections:    . Frequency of Communication with Friends and Family:    . Frequency of Social Gatherings with Friends and Family:    . Attends Religious Services:    . Active Member of Clubs or Organizations:    . Attends Banker Meetings:    Marland Kitchen Marital Status:    Intimate Partner Violence:    . Fear of Current or Ex-Partner:    . Emotionally Abused:    Marland Kitchen Physically Abused:    . Sexually Abused:      The following portions of the patient's history were reviewed and updated as appropriate: allergies, current medications, past family history, past medical history, past social history, past surgical history and problem list.    Review of Systems   Constitutional: Negative for chills and fever.   HENT: Negative.    Respiratory: Negative.    Gastrointestinal: Positive for abdominal pain, diarrhea and nausea. Negative for blood in stool, constipation and vomiting.        Objective:   BP 119/78 (BP Site: Left arm, Patient Position: Sitting, Cuff Size: Small)   Pulse 62   Temp 98 F (36.7 C) (Temporal)   Ht 1.549 m (5\' 1" )   Wt 61.7 kg (136 lb)   BMI 25.70 kg/m   Wt Readings from Last 3 Encounters:   03/19/20 61.7 kg (136 lb)   12/19/19 63.5 kg (140 lb)   12/08/19 64.4 kg (142 lb)     Physical Exam  Vitals and nursing note reviewed.   Constitutional:       General: She is not in acute distress.     Appearance: She is not ill-appearing.   Neck:      Thyroid: No thyroid mass or thyromegaly.      Vascular: No carotid bruit.   Cardiovascular:      Rate and Rhythm: Normal rate and regular rhythm.      Heart sounds: S1 normal and S2 normal. No murmur.       Comments:     Pulmonary:      Effort: Pulmonary effort  is normal.      Breath sounds: Normal breath sounds. No decreased breath sounds, wheezing, rhonchi or rales.   Abdominal:      General: Bowel sounds are normal. There is no distension.      Palpations: Abdomen is soft. There is no mass.      Tenderness: There is generalized abdominal tenderness. There is no guarding or rebound.      Hernia: No hernia is present.   Musculoskeletal:      Cervical back: Neck supple.      Right lower leg: No edema.      Left lower leg: No edema.   Lymphadenopathy:      Cervical: No cervical adenopathy.      Upper Body:      Right upper body: No supraclavicular adenopathy.      Left upper body: No supraclavicular adenopathy.   Neurological:      Mental Status: She is alert.            Assessment/Plan:       1. Gastroenteritis, acute  Diarrhea x 3 days  Vitals are stable  Symptomatic management at this time  Follow BLAND diet; maintain hydration with electrolyte solution  Hx of gastric bypass 10/2015  Hx of cholecystectomy and appendectomy  CT scan performed at [facility] on 03/11/2020 showed No acute process in the abdomen and pelvis.  - Comprehensive metabolic panel  - CBC and differential  Signs and symptoms that warrant return/urgent evaluation reviewed  Patient advised to call or return to clinic if these symptoms worsen or fail to improve as anticipated.  Advised recommend consult her gastroenterologist if no improvement    2. Essential hypertension  Improved  Continue current medications  Check Electrolytes - recently start hctz 12.5 mg daily      Return in about 4 weeks (around 04/16/2020) for Multiple conditions, Return sooner as needed.    Jules Husbands, DO

## 2020-03-19 NOTE — Progress Notes (Signed)
Have you seen any specialists/other providers since your last visit with us?    No    Arm preference verified?   Yes    The patient is due for  multiple

## 2020-04-04 ENCOUNTER — Encounter (INDEPENDENT_AMBULATORY_CARE_PROVIDER_SITE_OTHER): Payer: Self-pay | Admitting: Cardiology

## 2020-04-04 ENCOUNTER — Ambulatory Visit (INDEPENDENT_AMBULATORY_CARE_PROVIDER_SITE_OTHER): Payer: Medicare Other | Admitting: Cardiology

## 2020-04-04 ENCOUNTER — Ambulatory Visit (INDEPENDENT_AMBULATORY_CARE_PROVIDER_SITE_OTHER): Payer: Medicare Other

## 2020-04-04 VITALS — BP 151/86 | HR 62 | Temp 97.3°F | Wt 141.0 lb

## 2020-04-04 DIAGNOSIS — I10 Essential (primary) hypertension: Secondary | ICD-10-CM

## 2020-04-04 DIAGNOSIS — G459 Transient cerebral ischemic attack, unspecified: Secondary | ICD-10-CM

## 2020-04-04 NOTE — Progress Notes (Signed)
Amber [name] 69 y.o. with a history of HTN presents for cardiac follow up    Since her gastric surgery, she has been intolerant to different medications due to GI distress.     Hypertension: Difficulty tolerating Cardizem and Toprol.  She was placed on atenolol 50 mg daily with out GI distress.  Recent ER visit for hypertension.  Hydrochlorothiazide 12.5 mg daily was added to her regimen.  Blood pressure has been better controlled, but she does note palpitations after taking hydrochlorothiazide.    Palpitations:She has had intermittent palpitations. Palpitations have decreased with the addition of beta-blocker to her regimen. No significant events on a recent 24-hour Holter monitor. Last year she did wear a 30-day event monitor that noted occasional PVCs.    Cerebrovascular disease: History of a TIA. She has had no recurrence. This TIA occurred when she was not on aspirin. Currently compliant with aspirin.    Cardiac work up has included  - stress test (11/06/17 - normal myocardial perfusion)  - cardiac cath ( 01/08/14 - normal )  - echocardiogram ( EF 55%, mild TR)  - 24 hour holter - no significant events          Current Outpatient Medications   Medication Sig Dispense Refill   . aspirin EC 81 MG EC tablet Take 1 tablet (81 mg total) by mouth daily. 30 tablet 1   . atenolol (Tenormin) 50 MG tablet Take 1 tablet (50 mg total) by mouth daily 90 tablet 3   . clotrimazole-betamethasone (LOTRISONE) cream Apply to affected area 2 times daily 45 g 1   . Cyanocobalamin (B-12 IJ) Inject as directed once a week     . diazePAM (VALIUM) 2 MG tablet Take 1 tablet (2 mg total) by mouth every 8 (eight) hours as needed for Anxiety 30 tablet 2   . fluticasone (FLONASE) 50 MCG/ACT nasal spray 2 sprays by Nasal route daily. (Patient taking differently: 2 sprays by Nasal route as needed.   ) 16 g 2   . hydroCHLOROthiazide (MICROZIDE) 12.5 MG capsule Take 1 capsule  (12.5 mg total) by mouth daily 90 capsule 0   . Multiple Vitamin (MULTIVITAMIN) capsule Take 1 capsule by mouth daily     . ondansetron (ZOFRAN-ODT) 4 MG disintegrating tablet Take 1 tablet (4 mg total) by mouth every 6 (six) hours as needed for Nausea 8 tablet 0   . pantoprazole (PROTONIX) 40 MG tablet Take 1 tablet (40 mg total) by mouth daily 90 tablet 1   . valACYclovir HCL (VALTREX) 500 MG tablet TAKE 1 TABLET(500 MG) BY MOUTH DAILY 90 tablet 1   . vitamin D, ergocalciferol, (DRISDOL) 50000 UNIT Cap Take 1 capsule (50,000 Units total) by mouth once a week 12 capsule 1   . alendronate (FOSAMAX) 70 MG tablet TAKE 1 TABLET BY MOUTH EVERY WEEK AS DIRECTED 12 tablet 1     No current facility-administered medications for this visit.            PE:    Vitals:    04/04/20 0802   BP: 151/86   Pulse: 62   Temp: 97.3 F (36.3 C)     Body mass index is 26.64 kg/m.    Physical Examination: General appearance - alert, well appearing, and in no distress  Mental status - alert, oriented to person, place, and time  Chest - clear to auscultation, no wheezes, rales or rhonchi, symmetric air entry  Heart - normal rate and regular rhythm, S1 and S2  normal  Abdomen - soft, nontender, nondistended, no masses or organomegaly  Extremities - no pedal edema noted    EKG:    Normal sinus rhythm, LVH    Labs:  Lipid Panel   Cholesterol   Date/Time Value Ref Range Status   10/04/2019 09:56 AM 196 0 - 199 mg/dL Final     Triglycerides   Date/Time Value Ref Range Status   10/04/2019 09:56 AM 40 34 - 149 mg/dL Final     HDL   Date/Time Value Ref Range Status   10/04/2019 09:56 AM 73 40 - 9,999 mg/dL Final     Comment:     An HDL cholesterol <40 mg/dL is low and constitutes a  coronary heart disease risk factor, and HDL-C>59 mg/dL is  a negative risk factor for CHD.  Ref: American Heart Association; Circulation 2004         CMP:   Sodium   Date/Time Value Ref Range Status   03/19/2020 11:24 AM 141 136 - 145 mEq/L Final     Potassium    Date/Time Value Ref Range Status   03/19/2020 11:24 AM 4.0 3.5 - 5.1 mEq/L Final     Chloride   Date/Time Value Ref Range Status   03/19/2020 11:24 AM 105 100 - 111 mEq/L Final   08/16/2018 07:34 AM 107 98 - 110 mmol/L Final     CO2   Date/Time Value Ref Range Status   03/19/2020 11:24 AM 27 21 - 29 mEq/L Final     Glucose   Date/Time Value Ref Range Status   03/19/2020 11:24 AM 96 70 - 100 mg/dL Final     Comment:     ADA guidelines for diabetes mellitus:  Fasting:  Equal to or greater than 126 mg/dL  Random:   Equal to or greater than 200 mg/dL       BUN   Date/Time Value Ref Range Status   03/19/2020 11:24 AM 11.0 7.0 - 19.0 mg/dL Final     Protein, Total   Date/Time Value Ref Range Status   03/19/2020 11:24 AM 6.8 6.0 - 8.3 g/dL Final   16/08/9603 54:09 AM 6.5 6.1 - 8.1 g/dL Final     Alkaline Phosphatase   Date/Time Value Ref Range Status   03/19/2020 11:24 AM 87 37 - 106 U/L Final     AST (SGOT)   Date/Time Value Ref Range Status   03/19/2020 11:24 AM 18 5 - 34 U/L Final     ALT   Date/Time Value Ref Range Status   03/19/2020 11:24 AM 14 0 - 55 U/L Final     Anion Gap   Date/Time Value Ref Range Status   03/19/2020 11:24 AM 9.0 5.0 - 15.0 Final     Comment:     Calculated AGAP = Na - (CL + CO2)  Interpret with caution; calculated AGAP may not reflect patient's  true clinical status.         CBC:   WBC   Date/Time Value Ref Range Status   03/19/2020 11:24 AM 6.29 3.10 - 9.50 x10 3/uL Final   06/30/2009 04:03 PM 9.12 3.50 - 10.80 /CUMM Final     RBC   Date/Time Value Ref Range Status   03/19/2020 11:24 AM 4.79 3.90 - 5.10 x10 6/uL Final     Hemoglobin   Date/Time Value Ref Range Status   08/16/2018 07:34 AM 12.3 11.7 - 15.5 g/dL Final     Hgb   Date/Time Value Ref Range  Status   03/19/2020 11:24 AM 13.9 11.4 - 14.8 g/dL Final     Hematocrit   Date/Time Value Ref Range Status   03/19/2020 11:24 AM 45.2 (H) 34.7 - 43.7 % Final     MCV   Date/Time Value Ref Range Status   03/19/2020 11:24 AM 94.4 78.0 - 96.0 fL  Final     MCHC   Date/Time Value Ref Range Status   03/19/2020 11:24 AM 30.8 (L) 31.5 - 35.8 g/dL Final     RDW   Date/Time Value Ref Range Status   03/19/2020 11:24 AM 14 11 - 15 % Final     Platelets   Date/Time Value Ref Range Status   03/19/2020 11:24 AM 278 142 - 346 x10 3/uL Final   08/16/2018 07:34 AM 252 140 - 400 Thousand/uL Final           Impression / plan    Hypertension: Variable control.  Recommend decreasing sodium intake.  Recommend increasing activity level.  Continue current dose of atenolol 50 mg daily and hydrochlorothiazide 12.5 mg daily.  We will get a transthoracic echocardiogram to rule out hypertensive heart disease.  She will begin to record home blood pressure recordings and she will follow-up in the office in 2 to 4 weeks for further titration of her hypertensive medical regimen.    Follow-up in 2 to 4 weeks      Wylene Weissman Dan Humphreys  04/04/2020

## 2020-04-06 LAB — ECHOCARDIOGRAM ADULT COMPLETE W CLR/ DOPP WAVEFORM
AV Area (Cont Eq VTI): 2.494
AV Area (Cont Eq VTI): 2.495
AV Mean Gradient: 2.269
AV Peak Velocity: 104.807
Ao Root Diameter (2D): 2.622
IVS Diastolic Thickness (2D): 1.085
LA Dimension (2D): 3.758
LA Volume Index (BP A-L): 0.03
LVID diastole (2D): 4.137
LVID systole (2D): 2.628
MV Area (PHT): 3.195
MV E/A: 1.201
MV E/A: 1.201
MV E/e' (Average): 9.681
Mitral Valve Findings: NORMAL
Prox Ascending Aorta Diameter: 3.089
RV Basal Diastolic Dimension: 3.764
RV Systolic Pressure: 31.325
TAPSE: 0.024
TAPSE: 2.4

## 2020-04-09 ENCOUNTER — Encounter (INDEPENDENT_AMBULATORY_CARE_PROVIDER_SITE_OTHER): Payer: Self-pay

## 2020-04-10 ENCOUNTER — Encounter (INDEPENDENT_AMBULATORY_CARE_PROVIDER_SITE_OTHER): Payer: Self-pay

## 2020-04-12 ENCOUNTER — Encounter (INDEPENDENT_AMBULATORY_CARE_PROVIDER_SITE_OTHER): Payer: Self-pay | Admitting: Internal Medicine

## 2020-05-02 ENCOUNTER — Telehealth (HOSPITAL_BASED_OUTPATIENT_CLINIC_OR_DEPARTMENT_OTHER): Payer: Self-pay | Admitting: Gastroenterology

## 2020-05-02 DIAGNOSIS — K862 Cyst of pancreas: Secondary | ICD-10-CM

## 2020-05-02 NOTE — Telephone Encounter (Signed)
RETURN CALL: Voicemail - Detailed Message      SUBJECT:  Appointment Request     REASON FOR VISIT: Yearly follow up, no concerns.  PREFERRED DATE/TIME: Next available. Patient has Monday off, but will take anything.  ADDITIONAL INFORMATION: Patient is at work, but states OK to schedule & let her know the date & time. Thank you!

## 2020-05-07 ENCOUNTER — Encounter (INDEPENDENT_AMBULATORY_CARE_PROVIDER_SITE_OTHER): Payer: Self-pay | Admitting: Internal Medicine

## 2020-05-09 ENCOUNTER — Ambulatory Visit (INDEPENDENT_AMBULATORY_CARE_PROVIDER_SITE_OTHER): Payer: Medicare Other | Admitting: Cardiology

## 2020-05-09 ENCOUNTER — Encounter (INDEPENDENT_AMBULATORY_CARE_PROVIDER_SITE_OTHER): Payer: Self-pay | Admitting: Cardiology

## 2020-05-09 ENCOUNTER — Encounter (INDEPENDENT_AMBULATORY_CARE_PROVIDER_SITE_OTHER): Payer: Self-pay

## 2020-05-09 DIAGNOSIS — I10 Essential (primary) hypertension: Secondary | ICD-10-CM

## 2020-05-09 MED ORDER — ATENOLOL 100 MG PO TABS
100.0000 mg | ORAL_TABLET | Freq: Every day | ORAL | 3 refills | Status: DC
Start: 2020-05-09 — End: 2020-08-15

## 2020-05-09 NOTE — Progress Notes (Signed)
Amber [name] [phone].o. with a history of HTN presents for cardiac follow up    Since her gastric surgery, she has been intolerant to different medications due to GI distress.     Hypertension:Difficulty tolerating Cardizem and Toprol. She was placed on atenolol 50 mg daily with out GI distress.  Recent ER visit for hypertension.  Hydrochlorothiazide 12.5 mg daily was added to her regimen.  She now has developed headaches and palpitations which she attributes to hydrochlorothiazide Blood pressure has been better controlled    Palpitations:She has had intermittent palpitations. Palpitations have decreased with the addition of beta-blocker to her regimen. No significant events on a recent 24-hour Holter monitor. Last year she did wear a 30-day event monitor that noted occasional PVCs.    Cerebrovascular disease: History of a TIA. She has had no recurrence. This TIA occurred when she was not on aspirin. Currently compliant with aspirin.    Cardiac work up has included    Echocardiogram (04/04/2020): Normal LV/RV size and systolic function.  Ejection fraction 60%.  Mild left ventricular hypertrophy.  No significant valvular heart disease.  No pericardial effusion.      - stress test (11/06/17 - normal myocardial perfusion)  - cardiac cath ( 01/08/14 - normal )  - echocardiogram ( EF 55%, mild TR)  - 24 hour holter - no significant events          Current Outpatient Medications   Medication Sig Dispense Refill   . aspirin EC 81 MG EC tablet Take 1 tablet (81 mg total) by mouth daily. 30 tablet 1   . atenolol (Tenormin) 100 MG tablet Take 1 tablet (100 mg total) by mouth daily 90 tablet 3   . clotrimazole-betamethasone (LOTRISONE) cream Apply to affected area 2 times daily 45 g 1   . Cyanocobalamin (B-12 IJ) Inject as directed once a week     . diazePAM (VALIUM) 2 MG tablet Take 1 tablet (2 mg total) by mouth every 8 (eight) hours as needed for Anxiety 30  tablet 2   . fluticasone (FLONASE) 50 MCG/ACT nasal spray 2 sprays by Nasal route daily. (Patient taking differently: 2 sprays by Nasal route as needed.   ) 16 g 2   . Multiple Vitamin (MULTIVITAMIN) capsule Take 1 capsule by mouth daily     . ondansetron (ZOFRAN-ODT) 4 MG disintegrating tablet Take 1 tablet (4 mg total) by mouth every 6 (six) hours as needed for Nausea 8 tablet 0   . pantoprazole (PROTONIX) 40 MG tablet Take 1 tablet (40 mg total) by mouth daily 90 tablet 1   . valACYclovir HCL (VALTREX) 500 MG tablet TAKE 1 TABLET(500 MG) BY MOUTH DAILY 90 tablet 1   . vitamin D, ergocalciferol, (DRISDOL) 50000 UNIT Cap Take 1 capsule (50,000 Units total) by mouth once a week 12 capsule 1     No current facility-administered medications for this visit.          PE:    Vitals:    05/09/20 0932   BP: 153/89   Pulse: (!) 52   Temp: 97.3 F (36.3 C)     Body mass index is 27.21 kg/m.    Physical Examination: General appearance - alert, well appearing, and in no distress  Mental status - alert, oriented to person, place, and time  Chest - clear to auscultation, no wheezes, rales or rhonchi, symmetric air entry  Heart - normal rate and regular rhythm, S1 and S2 normal  Abdomen - soft, nontender,  Extremities - no pedal edema noted      Labs:  Lipid Panel   Cholesterol   Date/Time Value Ref Range Status   10/04/2019 09:56 AM 196 0 - 199 mg/dL Final     Triglycerides   Date/Time Value Ref Range Status   10/04/2019 09:56 AM 40 34 - 149 mg/dL Final     HDL   Date/Time Value Ref Range Status   10/04/2019 09:56 AM 73 40 - 9,999 mg/dL Final     Comment:     An HDL cholesterol <40 mg/dL is low and constitutes a  coronary heart disease risk factor, and HDL-C>59 mg/dL is  a negative risk factor for CHD.  Ref: American Heart Association; Circulation 2004         CMP:   Sodium   Date/Time Value Ref Range Status   03/19/2020 11:24 AM 141 136 - 145 mEq/L Final     Potassium   Date/Time Value Ref Range Status   03/19/2020 11:24 AM  4.0 3.5 - 5.1 mEq/L Final     Chloride   Date/Time Value Ref Range Status   03/19/2020 11:24 AM 105 100 - 111 mEq/L Final   08/16/2018 07:34 AM 107 98 - 110 mmol/L Final     CO2   Date/Time Value Ref Range Status   03/19/2020 11:24 AM 27 21 - 29 mEq/L Final     Glucose   Date/Time Value Ref Range Status   03/19/2020 11:24 AM 96 70 - 100 mg/dL Final     Comment:     ADA guidelines for diabetes mellitus:  Fasting:  Equal to or greater than 126 mg/dL  Random:   Equal to or greater than 200 mg/dL       BUN   Date/Time Value Ref Range Status   03/19/2020 11:24 AM 11.0 7 - 19 mg/dL Final     Protein, Total   Date/Time Value Ref Range Status   03/19/2020 11:24 AM 6.8 6.0 - 8.3 g/dL Final   16/08/9603 54:09 AM 6.5 6.1 - 8.1 g/dL Final     Alkaline Phosphatase   Date/Time Value Ref Range Status   03/19/2020 11:24 AM 87 37 - 106 U/L Final     AST (SGOT)   Date/Time Value Ref Range Status   03/19/2020 11:24 AM 18 5 - 34 U/L Final     ALT   Date/Time Value Ref Range Status   03/19/2020 11:24 AM 14 0 - 55 U/L Final     Anion Gap   Date/Time Value Ref Range Status   03/19/2020 11:24 AM 9.0 5 - 15 Final     Comment:     Calculated AGAP = Na - (CL + CO2)  Interpret with caution; calculated AGAP may not reflect patient's  true clinical status.         CBC:   WBC   Date/Time Value Ref Range Status   03/19/2020 11:24 AM 6.29 3.1 - 9.5 x10 3/uL Final   06/30/2009 04:03 PM 9.12 3.50 - 10.80 /CUMM Final     RBC   Date/Time Value Ref Range Status   03/19/2020 11:24 AM 4.79 3.90 - 5.10 x10 6/uL Final     Hemoglobin   Date/Time Value Ref Range Status   08/16/2018 07:34 AM 12.3 11.7 - 15.5 g/dL Final     Hgb   Date/Time Value Ref Range Status   03/19/2020 11:24 AM 13.9 11.4 - 14.8 g/dL Final     Hematocrit   Date/Time Value  Ref Range Status   03/19/2020 11:24 AM 45.2 (H) 34.7 - 43.7 % Final     MCV   Date/Time Value Ref Range Status   03/19/2020 11:24 AM 94.4 78.0 - 96.0 fL Final     MCHC   Date/Time Value Ref Range Status   03/19/2020 11:24  AM 30.8 (L) 31 - 35 g/dL Final     RDW   Date/Time Value Ref Range Status   03/19/2020 11:24 AM 14 11.0 - 15.0 % Final     Platelets   Date/Time Value Ref Range Status   03/19/2020 11:24 AM 278 142 - 346 x10 3/uL Final   08/16/2018 07:34 AM 252 140 - 400 Thousand/uL Final           Impression / plan    Hypertension: Blood pressure with improved control on atenolol 50 mg daily and hydrochlorothiazide 12.5 mg daily.  Patient is developed headache and palpitations while on hydrochlorothiazide.  Will discontinue hydrochlorothiazide and increase atenolol 100 mg daily.    Amber [name] Dan Humphreys  05/09/2020

## 2020-05-10 ENCOUNTER — Encounter (INDEPENDENT_AMBULATORY_CARE_PROVIDER_SITE_OTHER): Payer: Self-pay

## 2020-05-13 NOTE — Telephone Encounter (Signed)
Pending Dr. Tye Savoy MRI Abd w/w/o dye and creatinine order. Last creatinine was drawn in 2014

## 2020-05-13 NOTE — Telephone Encounter (Signed)
Pt has recall to repeat MRI in 1 year. Please assist with having order placed.

## 2020-06-03 ENCOUNTER — Telehealth (HOSPITAL_BASED_OUTPATIENT_CLINIC_OR_DEPARTMENT_OTHER): Payer: Self-pay | Admitting: Gastroenterology

## 2020-06-03 DIAGNOSIS — K862 Cyst of pancreas: Secondary | ICD-10-CM

## 2020-06-03 NOTE — Telephone Encounter (Signed)
Denial letter received from RN on 7/27:    From: Bernadene Bell  Sent: 05/28/2020  11:38 AM PDT  To: [place] Desk Pool  Subject:  Procedure/Exam Requested is Denied: Peer to*    [name],     Re: Jasmine Hunt  [id number]  Unit:   DOS: 08.10.21  Provider: Murvin Donning, MD  Diagnosis:  [phone] (ICD-10-CM) - 577.2 (ICD-9-CM) - Pancreatic cyst  CPT(s):  MABDWW  Insurance: [employer]              Reference Number [id number]    [facility], has denied services due to Medical criteria not met - Specify:       " The need and frequency for a follow up MRI are determined by the size of the mass. Your doctor did not tell us how big your mass is. Based on the "information we have, this test is not medically necessary.       A Denial letter available in Environmental health practitioner.    A peer to peer is required in order to attempt to get the denial overturned. Please see below for the insurance contact information.    Window of time: Within 15 days from denial date (07.26.21)    Please contact:   [employer] Physician Reviewer @ [phone]; ref # [id number]    Please let us know of the determination after Peer to Peer has been completed. If approved, please provide [place] with the authorization number, approved CPT code(s) and effective date range.    I called Peer to Peer to see if we could provide requested information however was informed that the case was closed on 7/26. They suggested re ordering the CT with the necessary information.    Pending Dr. [name] new MRI of Abdomen order.

## 2020-06-07 ENCOUNTER — Other Ambulatory Visit (INDEPENDENT_AMBULATORY_CARE_PROVIDER_SITE_OTHER): Payer: Self-pay | Admitting: Internal Medicine

## 2020-06-09 ENCOUNTER — Encounter (INDEPENDENT_AMBULATORY_CARE_PROVIDER_SITE_OTHER): Payer: Self-pay

## 2020-06-10 ENCOUNTER — Encounter (INDEPENDENT_AMBULATORY_CARE_PROVIDER_SITE_OTHER): Payer: Self-pay

## 2020-06-11 ENCOUNTER — Ambulatory Visit
Admission: RE | Admit: 2020-06-11 | Discharge: 2020-06-11 | Disposition: A | Discharge status: Home / Self Care | Payer: No Typology Code available for payment source | Attending: Diagnostic Radiology | Admitting: Diagnostic Radiology

## 2020-06-11 ENCOUNTER — Other Ambulatory Visit (INDEPENDENT_AMBULATORY_CARE_PROVIDER_SITE_OTHER): Payer: Self-pay | Admitting: Internal Medicine

## 2020-06-11 DIAGNOSIS — K862 Cyst of pancreas: Secondary | ICD-10-CM | POA: Insufficient documentation

## 2020-06-11 MED ORDER — GADOTERIDOL 279.3 MG/ML IV SOLN
14.0000 mL | Freq: Once | INTRAVENOUS | Status: AC
Start: 2020-06-11 — End: 2020-06-11
  Administered 2020-06-11: 7 mmol via INTRAVENOUS

## 2020-06-12 ENCOUNTER — Encounter (HOSPITAL_BASED_OUTPATIENT_CLINIC_OR_DEPARTMENT_OTHER): Payer: Self-pay | Admitting: Gastroenterology

## 2020-06-17 ENCOUNTER — Telehealth (HOSPITAL_BASED_OUTPATIENT_CLINIC_OR_DEPARTMENT_OTHER): Payer: Self-pay | Admitting: Gastroenterology

## 2020-06-17 NOTE — Telephone Encounter (Signed)
I will call her.

## 2020-06-17 NOTE — Telephone Encounter (Signed)
Name of Caller: Janautica  Relation to Patient: Self    Reason for call:   Pt is calling and would like to speak to Dr. Tye Savoy about their MRI results that was done on 06/11/20 (results are under Chart Review). Would you like this pt to be scheduled as a telemedicine or can you give pt a call? Please advise. Thanks      Action:   Routing to Provider    Call back requested? YES  Call Back at: Mobile

## 2020-07-10 ENCOUNTER — Encounter (INDEPENDENT_AMBULATORY_CARE_PROVIDER_SITE_OTHER): Payer: Self-pay

## 2020-07-11 ENCOUNTER — Encounter (INDEPENDENT_AMBULATORY_CARE_PROVIDER_SITE_OTHER): Payer: Self-pay

## 2020-07-14 ENCOUNTER — Other Ambulatory Visit (INDEPENDENT_AMBULATORY_CARE_PROVIDER_SITE_OTHER): Payer: Self-pay | Admitting: Internal Medicine

## 2020-07-16 ENCOUNTER — Encounter (INDEPENDENT_AMBULATORY_CARE_PROVIDER_SITE_OTHER): Payer: Self-pay | Admitting: Internal Medicine

## 2020-07-16 ENCOUNTER — Ambulatory Visit (INDEPENDENT_AMBULATORY_CARE_PROVIDER_SITE_OTHER): Payer: Medicare Other | Admitting: Internal Medicine

## 2020-07-16 ENCOUNTER — Other Ambulatory Visit (INDEPENDENT_AMBULATORY_CARE_PROVIDER_SITE_OTHER): Payer: Self-pay

## 2020-07-16 VITALS — BP 192/84 | HR 53 | Temp 97.6°F | Resp 14 | Ht 61.0 in | Wt 155.6 lb

## 2020-07-16 DIAGNOSIS — K219 Gastro-esophageal reflux disease without esophagitis: Secondary | ICD-10-CM

## 2020-07-16 DIAGNOSIS — I10 Essential (primary) hypertension: Secondary | ICD-10-CM

## 2020-07-16 DIAGNOSIS — Z8709 Personal history of other diseases of the respiratory system: Secondary | ICD-10-CM | POA: Insufficient documentation

## 2020-07-16 DIAGNOSIS — R110 Nausea: Secondary | ICD-10-CM

## 2020-07-16 DIAGNOSIS — R0989 Other specified symptoms and signs involving the circulatory and respiratory systems: Secondary | ICD-10-CM

## 2020-07-16 MED ORDER — LOSARTAN POTASSIUM 50 MG PO TABS
50.0000 mg | ORAL_TABLET | Freq: Every day | ORAL | 1 refills | Status: DC
Start: 2020-07-16 — End: 2020-08-15

## 2020-07-16 MED ORDER — PANTOPRAZOLE SODIUM 40 MG PO TBEC
40.0000 mg | DELAYED_RELEASE_TABLET | Freq: Every day | ORAL | 1 refills | Status: DC
Start: 2020-07-16 — End: 2021-01-07

## 2020-07-16 MED ORDER — FAMOTIDINE 20 MG PO TABS
20.0000 mg | ORAL_TABLET | Freq: Every day | ORAL | 1 refills | Status: DC
Start: 2020-07-16 — End: 2023-04-12

## 2020-07-16 NOTE — Progress Notes (Signed)
07/16/20 1523   Pt Outreach/Care Plan Metrics   [employer] Grouping for Outreach MSSP   Outreach Status for Metrics listing Left Voicemail 1     Patient was on the [facility] Medical Report List. Nurse Navigator placed a call to patient to introduce and offer care management services. Patient did not answer. Left voicemail message with contact information for questions or concerns.       Arva Chafe., RN, BSN, CDCES, ACM-RN   Patient Care Nurse Navigator  Ambulatory Care Management   T [phone]

## 2020-07-16 NOTE — Progress Notes (Signed)
Subjective:       Patient ID: Amber [name] is a 69 y.o. female.    HPI  Pt is here for follow up     HTN, home BP 160/90's, no cp, no sob,     Chronic nausea, sees GI, takes zofran  +reflux sx, compliant with med, some dysphagia,   Pt reported previous EGD, UGI ok,     +right ear fullness, dizziness, no hearing loss,   Has not been using flonase,         The following portions of the patient's history were reviewed and updated as appropriate: allergies, current medications, past family history, past medical history, past social history, past surgical history and problem list.    Review of Systems   Constitutional: Negative for chills, fever and unexpected weight change.   Eyes: Negative for visual disturbance.   Respiratory: Negative for shortness of breath.    Cardiovascular: Negative for chest pain and palpitations.   Gastrointestinal: Positive for nausea. Negative for abdominal pain and vomiting.   Genitourinary: Negative for dysuria.   Musculoskeletal: Negative for myalgias.   Neurological: Positive for dizziness. Negative for syncope, weakness, light-headedness, numbness and headaches.     BP 192/84 (BP Site: Left arm, Patient Position: Sitting, Cuff Size: Medium)   Pulse (!) 53   Temp 97.6 F (36.4 C) (Temporal)   Resp 14   Ht 1.549 m (5\' 1" )   Wt 70.6 kg (155 lb 9.6 oz)   BMI 29.40 kg/m        Objective:    Physical Exam  Constitutional:       General: She is not in acute distress.     Appearance: She is well-developed.   HENT:      Mouth/Throat:      Pharynx: No oropharyngeal exudate.   Eyes:      General: No scleral icterus.     Conjunctiva/sclera: Conjunctivae normal.   Neck:      Thyroid: No thyromegaly.      Vascular: No carotid bruit or JVD.   Cardiovascular:      Rate and Rhythm: Normal rate and regular rhythm.      Heart sounds: Normal heart sounds. No murmur heard.   No friction rub. No gallop.    Pulmonary:      Effort: Pulmonary effort is normal. No respiratory distress.      Breath  sounds: Normal breath sounds. No rales.   Abdominal:      General: Bowel sounds are normal. There is no distension.      Palpations: Abdomen is soft.      Tenderness: There is no abdominal tenderness. There is no guarding or rebound.   Musculoskeletal:      Cervical back: Neck supple.      Right lower leg: No edema.      Left lower leg: No edema.   Neurological:      General: No focal deficit present.      Mental Status: She is alert.             Assessment:       1. Essential hypertension  losartan (COZAAR) 50 MG tablet   2. Gastroesophageal reflux disease, unspecified whether esophagitis present  pantoprazole (PROTONIX) 40 MG tablet    famotidine (PEPCID) 20 MG tablet   3. Labile hypertension     4. Chronic nausea            Plan:      Procedures  No  orders of the defined types were placed in this encounter.      rx add losartan 50mg  daily  rx add famoditine  rx refills.   Monitor home BP  Resume flonase  F/u with GI  Weight reduction  F/u 4 wks. Or sooner prn

## 2020-07-17 ENCOUNTER — Other Ambulatory Visit (INDEPENDENT_AMBULATORY_CARE_PROVIDER_SITE_OTHER): Payer: Self-pay

## 2020-07-17 NOTE — Progress Notes (Signed)
07/17/20 1111   Pt Outreach/Care Plan Metrics   [employer] Grouping for Outreach MSSP   Outreach Status for Metrics listing Left Voicemail 2  (Patient did not answer)       Patient was on the Collective Medical Report List. Nurse Navigator placed a call to patient to introduce and offer care management services. Patient did not answer. Left voicemail message with contact information for questions or concerns.       Laurence Compton RN, BSN, CDCES, ACM-RN   Patient Care Nurse Navigator  Ambulatory Care Management   T [phone]

## 2020-07-22 ENCOUNTER — Other Ambulatory Visit (INDEPENDENT_AMBULATORY_CARE_PROVIDER_SITE_OTHER): Payer: Self-pay

## 2020-07-22 NOTE — Progress Notes (Addendum)
07/22/20 1442   Pt Outreach/Care Plan Metrics   Pay[employer] Grouping for Outreach MSSP   Outreach Status for Metrics listing Left Voicemail 3  (Patient did not answer)       Patient was on the Collective Medical Report List. Nurse Navigator placed a call to patient to introduce and offer care management services. Patient did not answer. Unable to reach. Left voicemail message with contact information for questions or concerns.       Arva Chafe., RN, BSN, CDCES, ACM-RN   Patient Care Nurse Navigator  Ambulatory Care Management   T [phone]

## 2020-07-23 ENCOUNTER — Encounter (INDEPENDENT_AMBULATORY_CARE_PROVIDER_SITE_OTHER): Payer: Self-pay | Admitting: Internal Medicine

## 2020-07-29 ENCOUNTER — Other Ambulatory Visit (HOSPITAL_BASED_OUTPATIENT_CLINIC_OR_DEPARTMENT_OTHER): Payer: Self-pay

## 2020-08-06 ENCOUNTER — Encounter (INDEPENDENT_AMBULATORY_CARE_PROVIDER_SITE_OTHER): Payer: Self-pay | Admitting: Internal Medicine

## 2020-08-09 ENCOUNTER — Encounter (INDEPENDENT_AMBULATORY_CARE_PROVIDER_SITE_OTHER): Payer: Self-pay

## 2020-08-10 ENCOUNTER — Encounter (INDEPENDENT_AMBULATORY_CARE_PROVIDER_SITE_OTHER): Payer: Self-pay

## 2020-08-15 ENCOUNTER — Ambulatory Visit (INDEPENDENT_AMBULATORY_CARE_PROVIDER_SITE_OTHER): Payer: Medicare Other | Admitting: Internal Medicine

## 2020-08-15 ENCOUNTER — Encounter (INDEPENDENT_AMBULATORY_CARE_PROVIDER_SITE_OTHER): Payer: Self-pay | Admitting: Internal Medicine

## 2020-08-15 VITALS — BP 179/91 | HR 55 | Temp 97.8°F | Ht 61.0 in | Wt 157.4 lb

## 2020-08-15 DIAGNOSIS — F419 Anxiety disorder, unspecified: Secondary | ICD-10-CM

## 2020-08-15 DIAGNOSIS — I10 Essential (primary) hypertension: Secondary | ICD-10-CM

## 2020-08-15 MED ORDER — DIAZEPAM 5 MG PO TABS
5.0000 mg | ORAL_TABLET | Freq: Three times a day (TID) | ORAL | 2 refills | Status: DC | PRN
Start: 2020-08-15 — End: 2021-08-27

## 2020-08-15 NOTE — Progress Notes (Signed)
Have you seen any specialists/other providers since your last visit with us?    No    Arm preference verified?   Yes    The patient is due for mammogram, shingles vaccine, pneumonia vaccine and advance directive

## 2020-08-15 NOTE — Progress Notes (Signed)
Subjective:       Patient ID: Amber [name] is a 69 y.o. female.    HPI  Pt is here for follow up     Recent hosp adm for uncontrolled HTN,   Seen by card,   Losartan increased to 100mg  daily    Home BP 120-140's/70-80's,   Hx of intolerance to multiple BP meds.     Denies signif anxiety,   Takes valium prn  Unable to tolerated SSRI's in the past    The following portions of the patient's history were reviewed and updated as appropriate: allergies, current medications, past family history, past medical history, past social history, past surgical history and problem list.    Review of Systems   Constitutional: Negative for appetite change and unexpected weight change.   Respiratory: Negative for shortness of breath.    Cardiovascular: Negative for chest pain and palpitations.   Gastrointestinal: Negative for abdominal pain, nausea and vomiting.   Genitourinary: Negative for dysuria.   Musculoskeletal: Negative for myalgias.   Neurological: Negative for syncope, light-headedness and headaches.     BP (!) 179/91 (BP Site: Left arm, Patient Position: Sitting, Cuff Size: Large)   Pulse (!) 55   Temp 97.8 F (36.6 C) (Temporal)   Ht 1.549 m (5\' 1" )   Wt 71.4 kg (157 lb 6.4 oz)   BMI 29.74 kg/m        Objective:    Physical Exam  Constitutional:       General: She is not in acute distress.     Appearance: She is well-developed.   HENT:      Mouth/Throat:      Pharynx: No oropharyngeal exudate.   Eyes:      General: No scleral icterus.     Conjunctiva/sclera: Conjunctivae normal.   Neck:      Thyroid: No thyromegaly.      Vascular: No carotid bruit or JVD.   Cardiovascular:      Rate and Rhythm: Normal rate and regular rhythm.      Heart sounds: Normal heart sounds. No murmur heard.   No friction rub. No gallop.    Pulmonary:      Effort: Pulmonary effort is normal. No respiratory distress.      Breath sounds: Normal breath sounds. No rales.   Abdominal:      General: Bowel sounds are normal. There is no  distension.      Palpations: Abdomen is soft.      Tenderness: There is no abdominal tenderness. There is no guarding or rebound.   Musculoskeletal:      Cervical back: Neck supple.      Right lower leg: No edema.      Left lower leg: No edema.   Neurological:      Mental Status: She is alert.             Assessment:       1. White coat syndrome with diagnosis of hypertension     2. Anxiety  diazePAM (VALIUM) 5 MG tablet          Plan:      Procedures  No orders of the defined types were placed in this encounter.      Continue meds  rx valium , increase dose to 5mg  prn  Monitor home BP, bring readings log and home BP monitor to next ov.   Keep f/u with cardio  F/u 3 months.

## 2020-08-26 ENCOUNTER — Telehealth (INDEPENDENT_AMBULATORY_CARE_PROVIDER_SITE_OTHER): Payer: Self-pay | Admitting: Internal Medicine

## 2020-08-26 ENCOUNTER — Telehealth (INDEPENDENT_AMBULATORY_CARE_PROVIDER_SITE_OTHER): Payer: Self-pay | Admitting: Cardiology

## 2020-08-26 ENCOUNTER — Encounter (INDEPENDENT_AMBULATORY_CARE_PROVIDER_SITE_OTHER): Payer: Self-pay | Admitting: Internal Medicine

## 2020-08-26 NOTE — Telephone Encounter (Signed)
Called pt regarding Mychart message of her stating her was having symptoms possibly from her Losartan medication dose increase of 50mg  to 100mg . Pt has had the same symptoms when on the 50mg  dose but has worsened. Main sx: headache, stomach pains, knees achy and nausea. No chest pain  Pt takes Atenol at 6am, losartan at 9am, atenolol at 12pm daily. Pt states BP still elevated, but can not recall exact numbers. Informed pt to check it 2x/day at 6am and before taking second dose of Atenolol.

## 2020-08-26 NOTE — Telephone Encounter (Signed)
Contact pt and informed her of MD recommendations to maintain on medication, option to make an earlier appt to f/u with provider to discuss further or cardiologist, whom increased her BP medication dose. Pt has some sensitivities with medications in the past and states she made an appt with cardiologist.

## 2020-08-26 NOTE — Telephone Encounter (Signed)
Pt called in requesting to speak with some one in regards to her medications. She stated her blood pressure is not under control and the medications are making her sick. Please advise. Pt can best be reached at [phone]. Thanks!

## 2020-09-02 ENCOUNTER — Other Ambulatory Visit (INDEPENDENT_AMBULATORY_CARE_PROVIDER_SITE_OTHER): Payer: Self-pay | Admitting: Internal Medicine

## 2020-09-02 MED ORDER — ATENOLOL 50 MG PO TABS
50.0000 mg | ORAL_TABLET | Freq: Two times a day (BID) | ORAL | 1 refills | Status: DC
Start: 2020-09-02 — End: 2021-03-10

## 2020-09-02 MED ORDER — LOSARTAN POTASSIUM 100 MG PO TABS
100.0000 mg | ORAL_TABLET | Freq: Every day | ORAL | 1 refills | Status: DC
Start: 2020-09-02 — End: 2020-10-01

## 2020-09-09 ENCOUNTER — Encounter (INDEPENDENT_AMBULATORY_CARE_PROVIDER_SITE_OTHER): Payer: Self-pay

## 2020-09-10 ENCOUNTER — Encounter (INDEPENDENT_AMBULATORY_CARE_PROVIDER_SITE_OTHER): Payer: Self-pay

## 2020-10-01 ENCOUNTER — Encounter (INDEPENDENT_AMBULATORY_CARE_PROVIDER_SITE_OTHER): Payer: Self-pay | Admitting: Cardiology

## 2020-10-01 ENCOUNTER — Ambulatory Visit (INDEPENDENT_AMBULATORY_CARE_PROVIDER_SITE_OTHER): Payer: Medicare Other | Admitting: Cardiology

## 2020-10-01 VITALS — BP 164/97 | HR 52 | Resp 18 | Ht 61.0 in | Wt 155.0 lb

## 2020-10-01 DIAGNOSIS — I10 Essential (primary) hypertension: Secondary | ICD-10-CM

## 2020-10-01 MED ORDER — LOSARTAN POTASSIUM 100 MG PO TABS
50.0000 mg | ORAL_TABLET | Freq: Every day | ORAL | 3 refills | Status: DC
Start: 2020-10-01 — End: 2020-10-15

## 2020-10-01 NOTE — Progress Notes (Signed)
Amber [name] [phone]. with a history of HTN presents for cardiac follow up of hypertension.    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     Hypertension:Multiple medication intolerances since bariatric surgery.  Currently she is on losartan 100 mg daily and atenolol 100 mg daily.  She was intolerant to the recently prescribed hydrochlorothiazide.  She notes blood pressure not well controlled at home on current regimen.  She also complains of significant nausea when taking her losartan.    Palpitations:She has had intermittent palpitations. Palpitations have decreased with the addition of beta-blocker to her regimen. No significant events on a recent 24-hour Holter monitor. Last year she did wear a 30-day event monitor that noted occasional PVCs.    Cerebrovascular disease: History of a TIA. She has had no recurrence. This TIA occurred when she was not on aspirin. Currently compliant with aspirin.    Cardiac work up has included    Echocardiogram (04/04/2020): Normal LV/RV size and systolic function.  Ejection fraction 60%.  Mild left ventricular hypertrophy.  No significant valvular heart disease.  No pericardial effusion.      - stress test (11/06/17 - normal myocardial perfusion)  - cardiac cath ( 01/08/14 - normal )  - echocardiogram ( EF 55%, mild TR)  - 24 hour holter - no significant events          Current Outpatient Medications   Medication Sig Dispense Refill   . aspirin EC 81 MG EC tablet Take 1 tablet (81 mg total) by mouth daily. 30 tablet 1   . atenolol (TENORMIN) 50 MG tablet Take 1 tablet (50 mg total) by mouth 2 (two) times daily Take 50 mg by mouth 2 (two) times daily 180 tablet 1   . Cyanocobalamin (B-12 IJ) Inject as directed once a week     . diazePAM (VALIUM) 5 MG tablet Take 1 tablet (5 mg total) by mouth every 8 (eight) hours as needed for Anxiety 30 tablet 2   .  famotidine (PEPCID) 20 MG tablet Take 1 tablet (20 mg total) by mouth daily 90 tablet 1   . fluticasone (FLONASE) 50 MCG/ACT nasal spray 2 sprays by Nasal route daily. (Patient taking differently: 2 sprays by Nasal route as needed.   ) 16 g 2   . losartan (COZAAR) 100 MG tablet Take 0.5 tablets (50 mg total) by mouth daily 90 tablet 3   . Multiple Vitamin (MULTIVITAMIN) capsule Take 1 capsule by mouth daily     . ondansetron (ZOFRAN-ODT) 4 MG disintegrating tablet Take 1 tablet (4 mg total) by mouth every 6 (six) hours as needed for Nausea 8 tablet 0   . pantoprazole (PROTONIX) 40 MG tablet Take 1 tablet (40 mg total) by mouth daily 90 tablet 1   . valACYclovir HCL (VALTREX) 500 MG tablet TAKE 1 TABLET(500 MG) BY MOUTH DAILY 90 tablet 1   . vitamin D, ergocalciferol, (DRISDOL) 50000 UNIT Cap Take 1 capsule (50,000 Units total) by mouth once a week 12 capsule 1     No current facility-administered medications for this visit.          PE:    Vitals:    10/01/20 1041   BP: (!) 164/97   Pulse: (!) 52   Resp:    SpO2:      Body mass index is 29.29 kg/m.    Physical Examination: General appearance - alert, well appearing, and in no distress  Mental status -  alert, oriented to person, place, and time  Chest - clear to auscultation, no wheezes, rales or rhonchi, symmetric air entry  Heart - normal rate and regular rhythm, S1 and S2 normal  Abdomen - soft, nontender, nondistended, no masses or organomegaly  Extremities - no pedal edema noted    Labs:  Lipid Panel   Cholesterol   Date/Time Value Ref Range Status   10/04/2019 09:56 AM 196 0 - 199 mg/dL Final     Triglycerides   Date/Time Value Ref Range Status   10/04/2019 09:56 AM 40 34 - 149 mg/dL Final     HDL   Date/Time Value Ref Range Status   10/04/2019 09:56 AM 73 40 - 9,999 mg/dL Final     Comment:     An HDL cholesterol <40 mg/dL is low and constitutes a  coronary heart disease risk factor, and HDL-C>59 mg/dL is  a negative risk factor for CHD.  Ref: American Heart  Association; Circulation 2004         CMP:   Sodium   Date/Time Value Ref Range Status   03/19/2020 11:24 AM 141 136 - 145 mEq/L Final     Potassium   Date/Time Value Ref Range Status   03/19/2020 11:24 AM 4.0 3.5 - 5.1 mEq/L Final     Chloride   Date/Time Value Ref Range Status   03/19/2020 11:24 AM 105 100 - 111 mEq/L Final   08/16/2018 07:34 AM 107 98 - 110 mmol/L Final     CO2   Date/Time Value Ref Range Status   03/19/2020 11:24 AM 27 21 - 29 mEq/L Final     Glucose   Date/Time Value Ref Range Status   03/19/2020 11:24 AM 96 70 - 100 mg/dL Final     Comment:     ADA guidelines for diabetes mellitus:  Fasting:  Equal to or greater than 126 mg/dL  Random:   Equal to or greater than 200 mg/dL       BUN   Date/Time Value Ref Range Status   03/19/2020 11:24 AM 11.0 7.0 - 19.0 mg/dL Final     Protein, Total   Date/Time Value Ref Range Status   03/19/2020 11:24 AM 6.8 6.0 - 8.3 g/dL Final   09/81/1914 78:29 AM 6.5 6.1 - 8.1 g/dL Final     Alkaline Phosphatase   Date/Time Value Ref Range Status   03/19/2020 11:24 AM 87 37 - 106 U/L Final     AST (SGOT)   Date/Time Value Ref Range Status   03/19/2020 11:24 AM 18 5 - 34 U/L Final     ALT   Date/Time Value Ref Range Status   03/19/2020 11:24 AM 14 0 - 55 U/L Final     Anion Gap   Date/Time Value Ref Range Status   03/19/2020 11:24 AM 9.0 5.0 - 15.0 Final     Comment:     Calculated AGAP = Na - (CL + CO2)  Interpret with caution; calculated AGAP may not reflect patient's  true clinical status.         CBC:   WBC   Date/Time Value Ref Range Status   03/19/2020 11:24 AM 6.29 3.10 - 9.50 x10 3/uL Final   06/30/2009 04:03 PM 9.12 3.50 - 10.80 /CUMM Final     RBC   Date/Time Value Ref Range Status   03/19/2020 11:24 AM 4.79 3.90 - 5.10 x10 6/uL Final     Hemoglobin   Date/Time Value Ref Range Status  08/16/2018 07:34 AM 12.3 11.7 - 15.5 g/dL Final     Hgb   Date/Time Value Ref Range Status   03/19/2020 11:24 AM 13.9 11.4 - 14.8 g/dL Final     Hematocrit   Date/Time Value Ref  Range Status   03/19/2020 11:24 AM 45.2 (H) 34.7 - 43.7 % Final     MCV   Date/Time Value Ref Range Status   03/19/2020 11:24 AM 94.4 78.0 - 96.0 fL Final     MCHC   Date/Time Value Ref Range Status   03/19/2020 11:24 AM 30.8 (L) 31.5 - 35.8 g/dL Final     RDW   Date/Time Value Ref Range Status   03/19/2020 11:24 AM 14 11 - 15 % Final     Platelets   Date/Time Value Ref Range Status   03/19/2020 11:24 AM 278 142 - 346 x10 3/uL Final   08/16/2018 07:34 AM 252 140 - 400 Thousand/uL Final           Impression / plan    Hypertension: Variable control.  Multiple intolerances since bariatric surgery.  We will continue atenolol 100 mg daily.  Will decrease losartan 50 mg daily.  If she still gets nauseous with decreased dose of losartan, will decrease to 25 mg.    Follow-up with cardiology 4 to 6 weeks    Nuvia Hileman Dan Humphreys, MD  10/01/2020

## 2020-10-06 ENCOUNTER — Emergency Department: Payer: Medicare Other

## 2020-10-06 ENCOUNTER — Emergency Department
Admission: EM | Admit: 2020-10-06 | Discharge: 2020-10-06 | Disposition: A | Discharge status: Home / Self Care | Payer: Medicare Other | Attending: Emergency Medicine | Admitting: Emergency Medicine

## 2020-10-06 DIAGNOSIS — M549 Dorsalgia, unspecified: Secondary | ICD-10-CM | POA: Insufficient documentation

## 2020-10-06 DIAGNOSIS — R0602 Shortness of breath: Secondary | ICD-10-CM

## 2020-10-06 DIAGNOSIS — R059 Cough, unspecified: Secondary | ICD-10-CM

## 2020-10-06 DIAGNOSIS — Z8679 Personal history of other diseases of the circulatory system: Secondary | ICD-10-CM

## 2020-10-06 DIAGNOSIS — G4733 Obstructive sleep apnea (adult) (pediatric): Secondary | ICD-10-CM | POA: Insufficient documentation

## 2020-10-06 DIAGNOSIS — Z8709 Personal history of other diseases of the respiratory system: Secondary | ICD-10-CM

## 2020-10-06 DIAGNOSIS — I10 Essential (primary) hypertension: Secondary | ICD-10-CM | POA: Insufficient documentation

## 2020-10-06 DIAGNOSIS — J45901 Unspecified asthma with (acute) exacerbation: Secondary | ICD-10-CM | POA: Insufficient documentation

## 2020-10-06 DIAGNOSIS — Z8669 Personal history of other diseases of the nervous system and sense organs: Secondary | ICD-10-CM

## 2020-10-06 LAB — TROPONIN I: Troponin I: <0.01 ng/mL (ref 0.00–0.05)

## 2020-10-06 LAB — CBC AND DIFFERENTIAL
Absolute NRBC: 0 10*3/uL (ref 0.00–0.00)
Basophils Absolute Automated: 0.03 10*3/uL (ref 0.00–0.08)
Basophils Automated: 0.6 %
Eosinophils Absolute Automated: 0.06 10*3/uL (ref 0.00–0.44)
Eosinophils Automated: 1.2 %
Hematocrit: 40.4 % (ref 34.7–43.7)
Hgb: 13.1 g/dL (ref 11.4–14.8)
Immature Granulocytes Absolute: 0.01 10*3/uL (ref 0.00–0.07)
Immature Granulocytes: 0.2 %
Lymphocytes Absolute Automated: 1.01 10*3/uL (ref 0.42–3.22)
Lymphocytes Automated: 19.8 %
MCH: 29.3 pg (ref 25.1–33.5)
MCHC: 32.4 g/dL (ref 31.5–35.8)
MCV: 90.4 fL (ref 78.0–96.0)
MPV: 9.5 fL (ref 8.9–12.5)
Monocytes Absolute Automated: 0.46 10*3/uL (ref 0.21–0.85)
Monocytes: 9 %
Neutrophils Absolute: 3.52 10*3/uL (ref 1.10–6.33)
Neutrophils: 69.2 %
Nucleated RBC: 0 /100 WBC (ref 0.0–0.0)
Platelets: 232 10*3/uL (ref 142–346)
RBC: 4.47 10*6/uL (ref 3.90–5.10)
RDW: 14 % (ref 11–15)
WBC: 5.09 10*3/uL (ref 3.10–9.50)

## 2020-10-06 LAB — COVID-19 (SARS-COV-2): SARS-CoV-2 Overall Result: NEGATIVE

## 2020-10-06 LAB — BASIC METABOLIC PANEL
Anion Gap: 9 (ref 5.0–15.0)
BUN: 8 mg/dL (ref 7.0–19.0)
CO2: 26 mEq/L (ref 22–29)
Calcium: 9.1 mg/dL (ref 8.5–10.5)
Chloride: 108 mEq/L (ref 100–111)
Creatinine: 0.7 mg/dL (ref 0.6–1.0)
Glucose: 89 mg/dL (ref 70–100)
Potassium: 3.7 mEq/L (ref 3.5–5.1)
Sodium: 143 mEq/L (ref 136–145)

## 2020-10-06 LAB — GFR: EGFR: >60

## 2020-10-06 MED ORDER — BENZONATATE 100 MG PO CAPS
100.0000 mg | ORAL_CAPSULE | Freq: Two times a day (BID) | ORAL | 0 refills | Status: AC | PRN
Start: 2020-10-06 — End: 2020-10-13

## 2020-10-06 MED ORDER — ALBUTEROL SULFATE (2.5 MG/3ML) 0.083% IN NEBU
2.5000 mg | INHALATION_SOLUTION | Freq: Once | RESPIRATORY_TRACT | Status: AC
Start: 2020-10-06 — End: 2020-10-06
  Administered 2020-10-06: 11:00:00 2.5 mg via RESPIRATORY_TRACT

## 2020-10-06 MED ORDER — BENZONATATE 100 MG PO CAPS
100.0000 mg | ORAL_CAPSULE | Freq: Once | ORAL | Status: AC
Start: 2020-10-06 — End: 2020-10-06
  Administered 2020-10-06: 11:00:00 100 mg via ORAL

## 2020-10-06 MED ORDER — ALBUTEROL SULFATE HFA 108 (90 BASE) MCG/ACT IN AERS
2.0000 | INHALATION_SPRAY | RESPIRATORY_TRACT | 0 refills | Status: AC | PRN
Start: 2020-10-06 — End: 2020-11-05

## 2020-10-06 MED ORDER — ALBUTEROL-IPRATROPIUM 2.5-0.5 (3) MG/3ML IN SOLN
3.0000 mL | Freq: Four times a day (QID) | RESPIRATORY_TRACT | Status: DC
Start: 2020-10-06 — End: 2020-10-06

## 2020-10-06 MED ORDER — IPRATROPIUM BROMIDE 0.02 % IN SOLN
0.5000 mg | Freq: Once | RESPIRATORY_TRACT | Status: AC
Start: 2020-10-06 — End: 2020-10-06
  Administered 2020-10-06: 11:00:00 0.5 mg via RESPIRATORY_TRACT

## 2020-10-06 MED ORDER — DEXAMETHASONE 4 MG PO TABS
10.0000 mg | ORAL_TABLET | Freq: Once | ORAL | Status: AC
Start: 2020-10-06 — End: 2020-10-06
  Administered 2020-10-06: 12:00:00 10 mg via ORAL

## 2020-10-06 NOTE — EDIE (Signed)
COLLECTIVE?NOTIFICATION?10/06/2020 10:02?Amber [name], Amber [name]?MRN: [id number]    Criteria Met      5 ED Visits in 12 Months    Security and Safety  No recent Security Events currently on file    ED Care Guidelines  There are currently no ED Care Guidelines for this patient. Please check your facility's medical records system.            E.D. Visit Count (12 mo.)  Facility Visits   [facility] 3   [facility] 1   [place] [facility] 2   Total 6   Note: Visits indicate total known visits.     Recent Emergency Department Visit Summary  Date Facility [facility] Type Diagnoses or Chief Complaint   Oct 06, 2020 [place] [facility] [place] [facility]      SOB, Cough, Sore Throat      Aug 03, 2020 [facility] [place] [name] [facility]      HIGH BLOOD PRESSURE      Chest pain, unspecified      Mar 11, 2020 [facility] [place] [name] [facility]      RS/CHEST PAIN      Essential (primary) hypertension      1. Chest pain, unspecified      2. Epigastric pain      4. Nausea      5. Diarrhea, unspecified      6. Shortness of breath      7. Sleep apnea, unspecified      8. Gastro-esophageal reflux disease without esophagitis      9. Long term (current) use of aspirin      Mar 08, 2020 [facility] [place] [facility] [place] [facility]      LF CALF/THIGH PAIN. SENT BY DR Janice Norrie.      Pain in left leg      1. Pain in left lower leg      2. Pain in left knee      2. Bilateral primary osteoarthritis of knee      3. Other specified postprocedural states      3. Personal history of transient ischemic attack (TIA), and cerebral infarction without residual deficits      4. Essential (primary) hypertension      5. Sleep apnea, unspecified      6. Gastro-esophageal reflux disease without esophagitis      Dec 08, 2019 [place] [facility]  Emergency      Nausea/Abdominal Pain      Abdominal Pain      Epigastric pain       Nov 23, 2019 [facility] [place] [name]  [facility]gency      RS CHEST PAIN      CHEST PAIN ADULT      Epigastric pain      Personal history of other mental and behavioral disorders      2. Headache, unspecified      4. Chest pain, unspecified      5. Tension-type headache, unspecified, not intractable      7. Shortness of breath      8. Tachycardia, unspecified      9. Nausea          Recent Inpatient Visit Summary  No recorded inpatient visits.     Care Team  There is not a care team on record at this time.  Collective Portal  This patient has registered at the [facility]: [facility]   For more information visit: [email or web address]     PLEASE NOTE:     1.   Any care recommendations and other clinical information are provided as guidelines or for historical purposes only, and providers should exercise their own clinical judgment when providing care.    2.   You may only use this information for purposes of treatment, payment or health care operations activities, and subject to the limitations of applicable Collective Policies.    3.   You should consult directly with the organization that provided a care guideline or other clinical history with any questions about additional information or accuracy or completeness of information provided.    ? 2021 [place], [employer] - [email or web address]

## 2020-10-06 NOTE — ED Provider Notes (Signed)
Chief Complaint   Patient presents with   . Shortness of Breath   . Cough       HPI  Amber [name] is a 69 y.o. female h/o HTN, HLD, TIA, asthma, sleep apnea who presents to the ED c/o SOB, cough, sore throat, body aches, back pain.    History obtained from patient.    Pt reports gradual onset of SOB a/w cough 2 days ago that has worsened since then. Also notes associated sore throat, and body aches most notably in bilateral shoulders across back. No anterior chest pain. She is vaccinated for COVID. Does not have an albuterol inhaler at home.     No history of CAD. Patient reports history of normal stress test. Denies history of DVT/PE or aortic problems.     No associated fever, syncope, abd pain, n/v/d, urinary symptoms, leg pain/swelling, or any other complaints.     Reviewed nursing records: yes    Reviewed patients previous MEDICAL RECORD NUMBERyes.  Reviewed office visit from 10/01/2020. Patient seen for cardiac follow-up of hypertension.  She has had multiple medication intolerances since bariatric surgery, currently on losartan and atenolol.  Was previously intolerant to hydrochlorothiazide.  During this visit, continued atenolol, decreased losartan dose.    Past Medical History   Past Medical History:   Diagnosis Date   . Asthma    . Bronchitis 01/02/2019   . Chest pain 2016     Chest pain after eating x6  months--being followed by GI for GERD   . Claustrophobia     MRIs   . Constipation    . Genital herpes     no current outbreak (10/03/15)   . GERD (gastroesophageal reflux disease)    . Hiatal hernia     h/o surgery   . Hyperlipidemia    . Hypertensive disorder     Well controlled on meds per pt. Elevated with pain.   . Low back pain    . Nausea without vomiting    . OSA on CPAP 2015    CPAP nightly x 1 yr, used nightly.   . Osteoarthritis of knees, bilateral     and back   . SBO (small bowel obstruction) 09/2018   . TIA (transient ischemic attack) 2014    TIA 2014-no residual. Patient evaulated for possible  TIA 07/12/2015  @ [facility] (dischage summary in epic)-per summary CT of head was normal and patient was d/c'd       Surgical History  Past Surgical History:   Procedure Laterality Date   . APPENDECTOMY  >25 yrs   . CHOLECYSTECTOMY     . EGD  01/2015   . EGD N/A 01/23/2019    Procedure: EGD;  Surgeon: Champ Mungo, MD;  Location: [facility];  Service: General;  Laterality: N/A;  ENDOSCOPY  MD REQ=30MINS; Q1=UNK   . EGD, BIOPSY N/A 09/15/2017    Procedure: EGD, BIOPSY;  Surgeon: Pershing Proud, MD;  Location: [facility];  Service: Gastroenterology;  Laterality: N/A;   . fallopian tube surgery  >25 yrs   . INSERTION, PAIN PUMP (MEDICAL) N/A 10/22/2015    Procedure: INSERTION, PAIN PUMP (MEDICAL);  Surgeon: Nicola Police, DO;  Location: [facility];  Service: General;  Laterality: N/A;   . LAPAROSCOPIC, CHOLECYSTECTOMY, CHOLANGIOGRAM  08/13/2014   . LAPAROSCOPIC, GASTRIC BYPASS N/A 10/22/2015    Procedure: LAPAROSCOPIC, GASTRIC BYPASS;  Surgeon: Nicola Police, DO;  Location:  MAIN OR;  Service: General;  Laterality: N/A;   . LAPAROSCOPIC, HERNIORRHAPHY, HIATAL N/A 10/22/2015    Procedure: LAPAROSCOPIC, HERNIORRHAPHY, HIATAL;  Surgeon: Nicola Police, DO;  Location: [facility];  Service: General;  Laterality: N/A;   . LAPAROSCOPIC, LYSIS, ADHESIONS N/A 09/01/2018    Procedure: LAPAROSCOPIC, LYSIS, ADHESIONS RELEASE OF  SMALL BOWEL OBSTRUCTION;  Surgeon: Champ Mungo, MD;  Location: [facility];  Service: General;  Laterality: N/A;   . LAPAROSCOPY, DIAGNOSTIC N/A 09/01/2018    Procedure: LAPAROSCOPY, DIAGNOSTIC;  Surgeon: Champ Mungo, MD;  Location: [facility];  Service: General;  Laterality: N/A;   . OVARY SURGERY Right >25 yrs   . SPINE SURGERY  11/2013    cervical HNP x 2, Dr. Bufford Buttner       Outpatient Medications  Current Outpatient Medications   Medication Instructions   . albuterol sulfate HFA (PROVENTIL) 108 (90 Base) MCG/ACT inhaler 2 puffs, Inhalation, Every 4  hours PRN, Dispense with spacer   . aspirin EC 81 mg, Oral, Daily   . atenolol (TENORMIN) 50 mg, Oral, 2 times daily, Take 50 mg by mouth 2 (two) times daily   . benzonatate (TESSALON) 100 mg, Oral, 2 times daily PRN   . Cyanocobalamin (B-12 IJ) Injection, weekly   . diazePAM (VALIUM) 5 mg, Oral, Every 8 hours PRN   . famotidine (PEPCID) 20 mg, Oral, Daily   . fluticasone (FLONASE) 50 MCG/ACT nasal spray 2 sprays, Nasal, Daily   . losartan (COZAAR) 50 mg, Oral, Daily   . Multiple Vitamin (MULTIVITAMIN) capsule 1 capsule, Oral, Daily   . ondansetron (ZOFRAN-ODT) 4 mg, Oral, Every 6 hours PRN   . pantoprazole (PROTONIX) 40 mg, Oral, Daily   . valACYclovir HCL (VALTREX) 500 MG tablet TAKE 1 TABLET(500 MG) BY MOUTH DAILY   . vitamin D (ergocalciferol) (DRISDOL) 50,000 Units, Oral, weekly        Allergies  Allergies   Allergen Reactions   . Acyclovir Nausea And Vomiting     Other reaction(s): gi distress   . Amlodipine      nausea   . Amoxicillin-Pot Clavulanate      Other reaction(s): gi distress     . Aspirin Nausea And Vomiting     Patient states "it upsets my stomach"  Other reaction(s): gi distress  Upsets stomach  Able to tolerate enteric coated aspirin   . Atorvastatin        Palpitation   . Carvedilol      Extreme fatigue   . Lisinopril      nausea   . Lovastatin Nausea And Vomiting     Other reaction(s): gi distress   . Metronidazole Nausea And Vomiting     Other reaction(s): gi distress   . Moxifloxacin Nausea And Vomiting         GI symptoms   . Rosuvastatin      Palpitation, chest pain, abd pain    . Statins      Chest pain, palpitations.   . Sulfa Antibiotics Nausea And Vomiting     Other reaction(s): gi distress       Social History  Social History     Tobacco Use   . Smoking status: Never Smoker   . Smokeless tobacco: Never Used   Vaping Use   . Vaping Use: Never used   Substance Use Topics   . Alcohol use: Never   . Drug use: Never       Family History  Family History   Problem  Relation Age of Onset   .  Cancer Mother 4        breast cancer   . Breast cancer Mother    . Heart disease Father    . Malignant hyperthermia Neg Hx    . Pseudochol deficiency Neg Hx        ROS   All other organ systems were reviewed and are negative except as stated in the HPI or noted below.    Physical Exam  When I was within 6 feet of this patient I donned the following PPE:  Surgical Mask: YES, Gloves: YES, Gown: NO; Goggles: YES; Face Shield: YES, 69M 6000 Respirator: NO; N95: YES.  The patient was wearing a mask during my evaluation: YES.    Vitals:    10/06/20 1014 10/06/20 1100 10/06/20 1157 10/06/20 1204   BP: 186/86 174/80  172/77   Pulse: (!) 58 (!) 56 (!) 58 (!) 53   Resp: 16      Temp: 98.2 F (36.8 C)      TempSrc: Oral      SpO2: 99% 100% 99% 100%   Weight: 70.1 kg         GENERAL: awake, alert, in no acute distress.   HEAD: normocephalic, atraumatic.   EYES: conjunctiva clear, EOMI, no eye drainage   ENT: external ear exam normal, nares patent with no drainage, mucous membranes moist.   NECK: neck is supple, with full ROM.   CARDIOVASCULAR: normal rate, regular rhythm, no murmurs, extremities appear warm and well-perfused.   RESPIRATORY: no increased work of breathing, good air movement, breath sounds clear to auscultation bilaterally, no wheezes, rhonchi, or crackles noted.   GI/ABDOMEN: non-distended, normal bowel sounds, soft, non-tender, no rebound, no guarding   MUSCULOSKELETAL/EXTREMITIES: no LE edema noted, no deformities or cyanosis noted.   SKIN: warm and dry, no rashes.   NEURO: no focal neurologic deficits, moving all four extremities.   PSYCH: normal affect, answers questions appropriately.     Diagnostic Studies  Results     Procedure Component Value Units Date/Time    Troponin I [[id number]] Collected: 10/06/20 1110    Specimen: Blood Updated: 10/06/20 1142     Troponin I <0.01 ng/mL     GFR [[id number]] Collected: 10/06/20 1110     Updated: 10/06/20 1139     EGFR >60.0    Basic Metabolic Panel [[id number]]  Collected: 10/06/20 1110    Specimen: Blood Updated: 10/06/20 1139     Glucose 89 mg/dL      BUN 8.0 mg/dL      Creatinine 0.7 mg/dL      Calcium 9.1 mg/dL      Sodium 086 mEq/L      Potassium 3.7 mEq/L      Chloride 108 mEq/L      CO2 26 mEq/L      Anion Gap 9.0    CBC and differential [[id number]] Collected: 10/06/20 1110    Specimen: Blood Updated: 10/06/20 1115     WBC 5.09 x10 3/uL      Hgb 13.1 g/dL      Hematocrit 52.8 %      Platelets 232 x10 3/uL      RBC 4.47 x10 6/uL      MCV 90.4 fL      MCH 29.3 pg      MCHC 32.4 g/dL      RDW 14 %      MPV 9.5 fL      Neutrophils 69.2 %  Lymphocytes Automated 19.8 %      Monocytes 9.0 %      Eosinophils Automated 1.2 %      Basophils Automated 0.6 %      Immature Granulocytes 0.2 %      Nucleated RBC 0.0 /100 WBC      Neutrophils Absolute 3.52 x10 3/uL      Lymphocytes Absolute Automated 1.01 x10 3/uL      Monocytes Absolute Automated 0.46 x10 3/uL      Eosinophils Absolute Automated 0.06 x10 3/uL      Basophils Absolute Automated 0.03 x10 3/uL      Immature Granulocytes Absolute 0.01 x10 3/uL      Absolute NRBC 0.00 x10 3/uL     Rapid influenza A/B antigens [[id number]] Collected: 10/06/20 1026    Specimen: Nasopharyngeal Swab from Nasal Aspirate Updated: 10/06/20 1059    Narrative:      ORDER#: [id number]                                    ORDERED BY: Lenoria Farrier  SOURCE: Nasal Aspirate                               COLLECTED:  10/06/20 10:26  ANTIBIOTICS AT COLL.:                                RECEIVED :  10/06/20 10:46  Influenza Rapid Antigen A&B                FINAL       10/06/20 10:59  10/06/20   Negative for Influenza A and B             Reference Range: Negative      COVID-19 (SARS-CoV-2) ID Now [[id number]] Collected: 10/06/20 1026    Specimen: Nasopharyngeal Swab from Nasopharynx Updated: 10/06/20 1058     Purpose of COVID testing Diagnostic -[facility]I     SARS-CoV-2 Specimen Source Nasopharyngeal     SARS CoV 2 Overall Result Negative    Narrative:      o  Collect and clearly label specimen type:  o Upper respiratory specimen: One Nasopharyngeal Dry Swab NO  Transport Media.  o Hand deliver to laboratory ASAP  Diagnostic -[facility]I           Chest AP Portable   Final Result      No evidence of acute cardiopulmonary disease.      Sharyn Blitz, MD    10/06/2020 11:08 AM          Medications administered in the ED  ED Medication Orders (From admission, onward)    Start Ordered     Status Ordering Provider    10/06/20 1157 10/06/20 1156  dexAMETHasone (DECADRON) tablet 10 mg  Once        Route: Oral  Ordered Dose: 10 mg     Last MAR action: Given Lenoria Farrier D    10/06/20 1037 10/06/20 1036  albuterol (PROVENTIL) (2.5 MG/3ML) 0.083% nebulizer solution 2.5 mg  RT - Once        Route: Nebulization  Ordered Dose: 2.5 mg     Last MAR action: Given Lenoria Farrier D    10/06/20 1037 10/06/20 1036  ipratropium (ATROVENT) 0.02 % nebulizer solution 0.5 mg  RT - Once        Route: Nebulization  Ordered Dose: 0.5 mg     Last MAR action: Given Lenoria Farrier D    10/06/20 1036 10/06/20 1035    RT - Every 6 hours scheduled        Route: Nebulization  Ordered Dose: 3 mL     Discontinued Lenoria Farrier D    10/06/20 1036 10/06/20 1035  benzonatate (TESSALON) capsule 100 mg  Once        Route: Oral  Ordered Dose: 100 mg     Last MAR action: Given Tiffani Kadow D          Assessment and Plan  MDM:  69 y.o. female h/o HTN, HLD, TIA, asthma, sleep apnea seen in the ED for cough, SOB, body aches, back pain.  Upon arrival to the ED, the pt was in no acute distress, VS were notable for BP 186/86, HR 58, o/w wnl including afebrile with O2 sat 99% on RA. Physical exam was notable as above.     Broad DDX initially considered on arrival including viral syndrome, COVID-19 infection, influenza, reactive airway disease with asthma exacerbation, pneumonia, pneumothorax, pleural effusion, ACS, electrolyte disturbance, anemia. Low suspicion for aortic dissection given no murmurs, no focal neuro  deficits, pulses equal.    ED Course:  I have independently visualized and interpreted the EKG which showed sinus bradycardia, rate of 57, normal axis and intervals, nonspecific ST changes, T wave inversion noted in lead III, no acute ischemic changes, baseline artifact noted, overall appears similar when compared to prior EKG from 01/02/2019.    I have independently visualized and interpreted the labs which were overall  Unremarkable including Covid negative, influenza negative, troponin negative, WBC within normal limits, H&H within normal limits, BMP within normal limits.    I have independently visualized and interpreted the imaging which revealed chest x-ray with no acute abnormalities including no evidence of pneumonia, pleural effusion, or pulmonary edema.    Patient given albuterol and ipratropium nebulizers in the ER, as well as Decadron and Tessalon for presumed asthma exacerbation with cough.  Likely viral illness. Upon re-eval, feeling improved. BP improved without intervention.     Discharged home with prescriptions for albuterol inhaler and Tessalon Perles.  Instructed to follow-up with her PCP this week.  Strict return precautions given, all questions and concerns were addressed, the patient was discharged home in stable condition.    Diagnosis  Final diagnoses:   Exacerbation of asthma, unspecified asthma severity, unspecified whether persistent   History of asthma   History of obstructive sleep apnea   History of hypertension   Shortness of breath   Cough   Back pain, unspecified back location, unspecified back pain laterality, unspecified chronicity       Bonney Aid, MD      This note was generated by the Epic EMR system/ Dragon speech recognition and may contain inherent errors or omissions not intended by the user. Grammatical errors, random word insertions, deletions and pronoun errors  are occasional consequences of this technology due to software limitations. Not all errors are caught or  corrected. If there are questions or concerns about the content of this note or information contained within the body of this dictation they should be addressed directly with the author for clarification.

## 2020-10-06 NOTE — Discharge Instructions (Addendum)
You were seen in the ER for coughing.  Your EKG, lab work, chest x-ray were reassuring.  Your Covid and flu tests were negative.  You were given a prescription for an albuterol inhaler and Tessalon Perles for cough.  Please follow up with your primary care doctor this week.  Please return to the ER for any new or worsening symptoms.

## 2020-10-08 ENCOUNTER — Telehealth (INDEPENDENT_AMBULATORY_CARE_PROVIDER_SITE_OTHER): Payer: Self-pay | Admitting: Internal Medicine

## 2020-10-08 NOTE — Telephone Encounter (Signed)
Attempted to call patient for ED follow up visit. LMTCB.

## 2020-10-09 ENCOUNTER — Encounter (INDEPENDENT_AMBULATORY_CARE_PROVIDER_SITE_OTHER): Payer: Self-pay

## 2020-10-10 ENCOUNTER — Encounter (INDEPENDENT_AMBULATORY_CARE_PROVIDER_SITE_OTHER): Payer: Self-pay

## 2020-10-15 ENCOUNTER — Ambulatory Visit (INDEPENDENT_AMBULATORY_CARE_PROVIDER_SITE_OTHER): Payer: Medicare Other | Admitting: Internal Medicine

## 2020-10-15 ENCOUNTER — Encounter (INDEPENDENT_AMBULATORY_CARE_PROVIDER_SITE_OTHER): Payer: Self-pay | Admitting: Internal Medicine

## 2020-10-15 VITALS — BP 173/90 | HR 60 | Temp 98.4°F | Resp 14 | Wt 154.4 lb

## 2020-10-15 DIAGNOSIS — J4520 Mild intermittent asthma, uncomplicated: Secondary | ICD-10-CM

## 2020-10-15 DIAGNOSIS — Z23 Encounter for immunization: Secondary | ICD-10-CM

## 2020-10-15 DIAGNOSIS — R0989 Other specified symptoms and signs involving the circulatory and respiratory systems: Secondary | ICD-10-CM

## 2020-10-15 DIAGNOSIS — I10 Essential (primary) hypertension: Secondary | ICD-10-CM

## 2020-10-15 MED ORDER — HYDRALAZINE HCL 25 MG PO TABS
25.0000 mg | ORAL_TABLET | Freq: Three times a day (TID) | ORAL | 5 refills | Status: DC
Start: 2020-10-15 — End: 2020-11-15

## 2020-10-15 NOTE — Progress Notes (Signed)
Have you seen any specialists/other providers since your last visit with [place]?    No    Arm preference verified?   Yes    The patient is due for covid -19 vaccine, influenza vaccine, MAWV, Pneumonia vaccine

## 2020-10-15 NOTE — Progress Notes (Signed)
Subjective:       Patient ID: Amber [name] is a 69 y.o. female.    HPI  Pt is here for follow up     Recent ED visit for cough, URI, exacerbation of asthma,   CXR neg, rx'd albuterol inh  Sx better,     HTN, unable to take losartan 100mg , card reduced dose to 50mg    Feels med makes her fatigue , headache,     Home BP 130-170's/90's,       The following portions of the patient's history were reviewed and updated as appropriate: allergies, current medications, past family history, past medical history, past social history, past surgical history and problem list.    Review of Systems   Constitutional: Negative for appetite change, chills, fever and unexpected weight change.   Respiratory: Negative for shortness of breath and wheezing.    Cardiovascular: Negative for chest pain and palpitations.   Gastrointestinal: Negative for abdominal pain, nausea and vomiting.   Genitourinary: Negative for dysuria.   Musculoskeletal: Negative for myalgias.   Neurological: Negative for syncope, light-headedness and headaches.     BP 173/90 (BP Site: Right arm, Patient Position: Sitting, Cuff Size: Medium)   Pulse 60   Temp 98.4 F (36.9 C) (Temporal)   Resp 14   Wt 70 kg (154 lb 6.4 oz)   LMP  (LMP Unknown)   SpO2 98%   BMI 29.17 kg/m        Objective:    Physical Exam  Constitutional:       General: She is not in acute distress.     Appearance: She is well-developed.   HENT:      Mouth/Throat:      Pharynx: No oropharyngeal exudate.   Eyes:      General: No scleral icterus.     Conjunctiva/sclera: Conjunctivae normal.   Neck:      Thyroid: No thyromegaly.      Vascular: No carotid bruit or JVD.   Cardiovascular:      Rate and Rhythm: Normal rate and regular rhythm.      Heart sounds: Normal heart sounds. No murmur heard.  No friction rub. No gallop.    Pulmonary:      Effort: Pulmonary effort is normal. No respiratory distress.      Breath sounds: Normal breath sounds. No rales.   Abdominal:      General: Bowel sounds  are normal. There is no distension.      Palpations: Abdomen is soft.      Tenderness: There is no abdominal tenderness. There is no guarding or rebound.   Musculoskeletal:      Cervical back: Neck supple.      Right lower leg: No edema.      Left lower leg: No edema.   Neurological:      Mental Status: She is alert.             Assessment:       1. White coat syndrome with diagnosis of hypertension  hydrALAZINE (APRESOLINE) 25 MG tablet   2. Labile hypertension  hydrALAZINE (APRESOLINE) 25 MG tablet   3. Flu vaccine need  Flu vaccine HIGH-DOSE QUAD (PF) 65 yrs and older   4. Mild intermittent asthma without complication            Plan:      Procedures  Orders Placed This Encounter   Procedures   . Flu vaccine HIGH-DOSE QUAD (PF) 65 yrs and older  Medications Ordered This Encounter       Disp Refills Start End    hydrALAZINE (APRESOLINE) 25 MG tablet 90 tablet 5 10/15/2020     Take 1 tablet (25 mg total) by mouth 3 (three) times daily - Oral        Flu vaccine given  D/c losartan  rx hydralazine 25mg  tid  Monitor home BP  Weight reduction  F/u 4 wks.

## 2020-10-21 ENCOUNTER — Encounter (INDEPENDENT_AMBULATORY_CARE_PROVIDER_SITE_OTHER): Payer: Self-pay | Admitting: Internal Medicine

## 2020-10-21 ENCOUNTER — Telehealth (INDEPENDENT_AMBULATORY_CARE_PROVIDER_SITE_OTHER): Payer: Self-pay | Admitting: Cardiology

## 2020-10-21 ENCOUNTER — Telehealth (INDEPENDENT_AMBULATORY_CARE_PROVIDER_SITE_OTHER): Payer: Self-pay

## 2020-10-21 NOTE — Telephone Encounter (Addendum)
PATIENT MADE APPT WITH ME AFTER NOT HEARING BACK FROM OFFICE YET. IF YOU WANT, YOU CAN CALL HER AND GET HER BP READINGS, SYMPTOMS AND WHICH MEDS HAVE NOT WORKED AND I CAN GIVE RECOMMENDATIONS SO THAT SHE DOES NOT HAVE TO COME INTO THE OFFICE.        Patient called today in regards to taking Hydralazine.  Per patient, Dr. Daivd Council prescribed it to her.  She was taking it three times a day.  Patient experienced side effects such as headaches.  Per patient, Dr. Benedict Needy office instructed patient to call her cardiologist and get advice from Dr. Katrinka Blazing.  Patient can be reached on [phone].        [facility]  [facility]

## 2020-10-21 NOTE — Telephone Encounter (Addendum)
Left pt VM to reach out to cardiologist to discuss BP medication and tolerance due to sensitivity with medications. Pt should hold taking hydralazine if side effects continue to worsen. Will try to reach pt later today to inform her.

## 2020-10-21 NOTE — Telephone Encounter (Signed)
Contacted pt regarding Mychart message stating she is experiencing sx from BP medication. Pt was recent changed to Hydralazine PO TID from Losartan due to side effects with Losartan. Pt has been experiencing intense headaches, some nausea, and some palpitations. She has noticed BP has decreased SBP 110-130s.     Advised pt to hold evening doses of hydralazine as she took her 8am dose and atenolol today. Asked pt to monitor BP later today and tomorrow prior to taking BP medication, take tylenol for headache. Will contact pt tomorrow morning to assess how she is feeling.     Sent to MD to review.

## 2020-10-22 ENCOUNTER — Ambulatory Visit (INDEPENDENT_AMBULATORY_CARE_PROVIDER_SITE_OTHER): Payer: Medicare Other | Admitting: Family Nurse Practitioner

## 2020-10-22 ENCOUNTER — Encounter (INDEPENDENT_AMBULATORY_CARE_PROVIDER_SITE_OTHER): Payer: Self-pay | Admitting: Family Nurse Practitioner

## 2020-10-22 VITALS — BP 171/96 | HR 66 | Ht 61.0 in | Wt 156.0 lb

## 2020-10-22 DIAGNOSIS — I10 Essential (primary) hypertension: Secondary | ICD-10-CM

## 2020-10-22 DIAGNOSIS — E7800 Pure hypercholesterolemia, unspecified: Secondary | ICD-10-CM

## 2020-10-22 DIAGNOSIS — G4733 Obstructive sleep apnea (adult) (pediatric): Secondary | ICD-10-CM

## 2020-10-22 NOTE — Progress Notes (Signed)
[place] CARDIOLOGY OFFICE VISIT    I had the pleasure of seeing Amber [name] today in cardiac follow up office visit for hypertension    [name] is a 69 y.o. female with a past medical history significant for OSA w/ CPAP utilization, HTN, TIA and HLD.  The patient is status post gastric bypass surgery and has developed an intolerance to several classes of HTN medications.     At today's visit Amber [name] is doing well.  She denies chest pain, shortness of breath, edema, palpitations, presyncope, or syncope. She complains of feeling "foggy and in a cloud."    She continues to follow-up with Dr.Smith for general cardiovascular care, last seen 11/21 with future appointment to be scheduled.     Cardiographics:  TTE(06/21):  EF 60-65% mild LVH, mild AS      ECG: SR w/ hr of 60 bpm    PMH:   Patient Active Problem List    Diagnosis Date Noted   . Mild intermittent asthma without complication 10/15/2020   . History of chronic sinusitis 07/16/2020   . 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 Labile hypertension 07/24/2019   . Allergic rhinitis 11/28/2018   . Chronic frontal sinusitis 11/28/2018   . 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   . Sinusitis 11/28/2018   . Primary osteoarthritis of both knees 11/28/2018   . Small bowel obstruction due to adhesions 09/01/2018   . Other dysphagia 08/26/2018   . Encounter for vitamin deficiency screening 08/10/2018   . Nausea 08/10/2018   . Epigastric abdominal tenderness without rebound tenderness 09/08/2016   . Anxiety 06/25/2016   . Nutritional deficiency 04/17/2016   . Bariatric surgery status 10/23/2015   . Hiatal hernia 09/20/2015   . Osteoarthritis of knees, bilateral 09/20/2015   . Headache 07/12/2015   . Dyslipidemia 03/21/2015   . Palpitations 02/12/2015   . Transient cerebral ischemia, unspecified type 11/07/2014   . S/P laparoscopic cholecystectomy 08/27/2014   . Other long term (current) drug therapy 08/12/2014   . Slow transit constipation  07/27/2014   . Obstructive sleep apnea syndrome 05/01/2014   . Anxiety state, unspecified 03/01/2014   . Vitamin D deficiency 02/19/2014   . Overactive bladder 02/06/2014   . Abnormal electrocardiogram 01/08/2014   . Essential hypertension 11/21/2013   . History of spinal surgery 11/21/2013   . Hypernatremia 11/11/2013   . Paresthesia 11/11/2013   . Cerebral infarction 04/15/2013   . Gastroesophageal reflux disease 04/02/2013   . Pituitary microadenoma 11/02/2004   . Hypercholesterolemia 11/02/2004        MEDICATIONS:     Current Outpatient Medications   Medication Sig Dispense Refill   . albuterol sulfate HFA (PROVENTIL) 108 (90 Base) MCG/ACT inhaler Inhale 2 puffs into the lungs every 4 (four) hours as needed for Wheezing or Shortness of Breath (coughing) Dispense with spacer 6.7 g 0   . aspirin EC 81 MG EC tablet Take 1 tablet (81 mg total) by mouth daily. 30 tablet 1   . atenolol (TENORMIN) 50 MG tablet Take 1 tablet (50 mg total) by mouth 2 (two) times daily Take 50 mg by mouth 2 (two) times daily 180 tablet 1   . Cyanocobalamin (B-12 IJ) Inject as directed every 30 (thirty) days        . diazePAM (VALIUM) 5 MG tablet Take 1 tablet (5 mg total) by mouth every 8 (eight) hours as needed for Anxiety 30 tablet 2   . famotidine (PEPCID) 20  MG tablet Take 1 tablet (20 mg total) by mouth daily 90 tablet 1   . fluticasone (FLONASE) 50 MCG/ACT nasal spray 2 sprays by Nasal route daily. (Patient taking differently: 2 sprays by Nasal route as needed.   ) 16 g 2   . hydrALAZINE (APRESOLINE) 25 MG tablet Take 1 tablet (25 mg total) by mouth 3 (three) times daily 90 tablet 5   . Multiple Vitamin (MULTIVITAMIN) capsule Take 1 capsule by mouth daily     . ondansetron (ZOFRAN-ODT) 4 MG disintegrating tablet Take 1 tablet (4 mg total) by mouth every 6 (six) hours as needed for Nausea 8 tablet 0   . pantoprazole (PROTONIX) 40 MG tablet Take 1 tablet (40 mg total) by mouth daily 90 tablet 1   . valACYclovir HCL (VALTREX) 500 MG  tablet TAKE 1 TABLET(500 MG) BY MOUTH DAILY 90 tablet 1   . vitamin D, ergocalciferol, (DRISDOL) 50000 UNIT Cap Take 1 capsule (50,000 Units total) by mouth once a week 12 capsule 1     No current facility-administered medications for this visit.        SH:   Social History     Tobacco Use   . Smoking status: Never Smoker   . Smokeless tobacco: Never Used   Vaping Use   . Vaping Use: Never used   Substance Use Topics   . Alcohol use: Never   . Drug use: Never       REVIEW OF SYSTEMS: All other systems reviewed and negative except as above.    PHYSICAL EXAMINATION  General Appearance: A well-appearing female in no acute distress.   Vital Signs: BP (!) 171/96   Pulse 66   Ht 1.549 m (5\' 1" )   Wt 70.8 kg (156 lb)   LMP  (LMP Unknown)   SpO2 99%   BMI 29.48 kg/m    HEENT: Sclera anicteric, conjunctiva without pallor, moist mucous membranes, normal dentition.   Neck: Supple without jugular venous distention. Thyroid nonpalpable. Normal carotid upstrokes without bruits.  Chest: Clear to auscultation bilaterally with good air movement and respiratory effort and no wheezes, rales, or rhonchi  Cardiovascular: Normal S1 and physiologically split S2 without murmurs, gallops or rub. PMI of normal size and nondisplaced.   Abdomen: Soft, nontender. No organomegaly.  No pulsatile masses or bruits.    Extremities: Warm without edema. All peripheral pulses are full and equal.  Skin: No rash, xanthoma or xanthelasma.   Neuro: Alert and oriented x3. Grossly intact. Strength is symmetrical. Normal mood and affect.       IMPRESSION/RECOMMENDATIONS:   1. Essential hypertension     2. Hypercholesterolemia     3. Obstructive sleep apnea syndrome        1- HTN  . Advised the patient to take atenolol 50 mg BID and add losartan 25 mg at night for BP control.  . Advised to monitor BP regularly, give time for medication to control BP and report any problems by way of mychart message  . Will cc Dr. Katrinka Blazing note, follow up with him already  scheduled  2- HLD  . Continue asa   3- OSA  . Patient uses CPAP consistently     ----------------------------  Maryjo Rochester, APRN  [facility]  Ph [phone]      Incident to service performed with physician present in the office in accordance with this patient's established plan of care.

## 2020-11-09 ENCOUNTER — Encounter (INDEPENDENT_AMBULATORY_CARE_PROVIDER_SITE_OTHER): Payer: Self-pay

## 2020-11-10 ENCOUNTER — Encounter (INDEPENDENT_AMBULATORY_CARE_PROVIDER_SITE_OTHER): Payer: Self-pay

## 2020-11-15 ENCOUNTER — Encounter (INDEPENDENT_AMBULATORY_CARE_PROVIDER_SITE_OTHER): Payer: Self-pay | Admitting: Internal Medicine

## 2020-11-15 ENCOUNTER — Ambulatory Visit (INDEPENDENT_AMBULATORY_CARE_PROVIDER_SITE_OTHER): Payer: Medicare Other | Admitting: Internal Medicine

## 2020-11-15 VITALS — BP 145/87 | HR 60 | Temp 98.0°F | Resp 14 | Wt 152.0 lb

## 2020-11-15 DIAGNOSIS — I10 Essential (primary) hypertension: Secondary | ICD-10-CM

## 2020-11-15 DIAGNOSIS — Z1231 Encounter for screening mammogram for malignant neoplasm of breast: Secondary | ICD-10-CM

## 2020-11-15 DIAGNOSIS — A6000 Herpesviral infection of urogenital system, unspecified: Secondary | ICD-10-CM

## 2020-11-15 DIAGNOSIS — K219 Gastro-esophageal reflux disease without esophagitis: Secondary | ICD-10-CM

## 2020-11-15 MED ORDER — LIDOCAINE HCL 2 % EX GEL
CUTANEOUS | 1 refills | Status: AC | PRN
Start: 2020-11-15 — End: 2021-11-15

## 2020-11-15 MED ORDER — LOSARTAN POTASSIUM 25 MG PO TABS
25.0000 mg | ORAL_TABLET | Freq: Every evening | ORAL | 1 refills | Status: DC
Start: 2020-11-15 — End: 2021-01-22

## 2020-11-15 NOTE — Progress Notes (Signed)
Subjective:       Patient ID: Amber [name] is a 70 y.o. female.    HPI  Pt is here for follow up     White coat HTN, home BP 120-150's/80-90's  Sees card, losartan added in PM  Hydralazine d/c'd due to headache    GERD , stable with med,     Hx genital herpes, uses lidocaine gel top prn,   Also takes valtrex prn    The following portions of the patient's history were reviewed and updated as appropriate: allergies, current medications, past family history, past medical history, past social history, past surgical history and problem list.    Review of Systems   Constitutional: Negative for appetite change, chills, fever and unexpected weight change.   Respiratory: Negative for shortness of breath.    Cardiovascular: Negative for chest pain and palpitations.   Gastrointestinal: Negative for abdominal pain, nausea and vomiting.   Genitourinary: Negative for dysuria.   Musculoskeletal: Negative for myalgias.   Neurological: Negative for syncope, light-headedness and headaches.     BP 145/87 (BP Site: Right arm, Patient Position: Sitting, Cuff Size: Large)   Pulse 60   Temp 98 F (36.7 C) (Temporal)   Resp 14   Wt 68.9 kg (152 lb)   LMP  (LMP Unknown)   BMI 28.72 kg/m        Objective:    Physical Exam  Constitutional:       General: She is not in acute distress.     Appearance: She is well-developed.   HENT:      Mouth/Throat:      Pharynx: No oropharyngeal exudate.   Eyes:      General: No scleral icterus.     Conjunctiva/sclera: Conjunctivae normal.   Neck:      Thyroid: No thyromegaly.      Vascular: No carotid bruit or JVD.   Cardiovascular:      Rate and Rhythm: Normal rate and regular rhythm.      Heart sounds: Normal heart sounds. No murmur heard.  No friction rub. No gallop.    Pulmonary:      Effort: Pulmonary effort is normal. No respiratory distress.      Breath sounds: Normal breath sounds. No rales.   Abdominal:      General: Bowel sounds are normal. There is no distension.      Palpations:  Abdomen is soft.      Tenderness: There is no abdominal tenderness. There is no guarding or rebound.   Musculoskeletal:      Cervical back: Neck supple.      Right lower leg: No edema.      Left lower leg: No edema.   Neurological:      Mental Status: She is alert.             Assessment:       1. White coat syndrome with diagnosis of hypertension  losartan (COZAAR) 25 MG tablet   2. Recurrent genital herpes  lidocaine (XYLOCAINE) 2 % jelly   3. Encounter for screening mammogram for malignant neoplasm of breast  Mammo Digital Screening Bilateral W Cad   4. Gastroesophageal reflux disease, unspecified whether esophagitis present            Plan:      Procedures  Orders Placed This Encounter   Procedures   . Mammo Digital Screening Bilateral W Cad     Standing Status:   Future     Standing  Expiration Date:   11/15/2021     Order Specific Question:   Reason for Exam:     Answer:   screening     Order Specific Question:   Release to patient     Answer:   Immediate     Medications Ordered This Encounter       Disp Refills Start End    lidocaine (XYLOCAINE) 2 % jelly 30 mL 1 11/15/2020 11/15/2021    Apply topically as needed (sore) - Topical    losartan (COZAAR) 25 MG tablet 90 tablet 1 11/15/2020     Take 1 tablet (25 mg total) by mouth every evening - Oral        Continue meds  Monitor home BP  rx refills.   Mammogram ordered  Keep f/u with specialists as directed

## 2020-11-15 NOTE — Progress Notes (Signed)
Have you seen any specialists/other providers since your last visit with [place]?    Yes, cardiologist     Arm preference verified?   Yes    The patient is due for mammogram, pneumonia vaccine and covid -19 booster, MAWV

## 2020-11-19 ENCOUNTER — Telehealth (INDEPENDENT_AMBULATORY_CARE_PROVIDER_SITE_OTHER): Payer: Medicare Other | Admitting: Cardiology

## 2020-11-19 ENCOUNTER — Encounter (INDEPENDENT_AMBULATORY_CARE_PROVIDER_SITE_OTHER): Payer: Self-pay | Admitting: Cardiology

## 2020-11-19 VITALS — BP 143/89 | HR 62 | Ht 61.0 in | Wt 152.0 lb

## 2020-11-19 DIAGNOSIS — I10 Essential (primary) hypertension: Secondary | ICD-10-CM

## 2020-11-19 DIAGNOSIS — E7800 Pure hypercholesterolemia, unspecified: Secondary | ICD-10-CM

## 2020-11-19 NOTE — Progress Notes (Signed)
VIDEO VISIT - [place] CARDIOLOGY FOLLOW UP     CONSENT: Verbal consent has been obtained from the patient to conduct a video visit encounter.  The time spent in medical discussion during this visit was 21 minutes.      Amber [name] [phone].o. with a history of HTN presents for cardiac follow up of hypertension.    Preop evaluation: She is tentatively scheduled for left knee surgery on 12/11/2020.  She is without chest pain or exertional dyspnea.  Her functional capacity is greater than 4 METS.  Most recent stress test (11/06/2017) was normal.  Most recent cardiac catheterization (2015) was normal.  She has preserved LV systolic function on most recent echocardiogram (04/04/2020).    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     Hypertension:Multiple medication intolerances since bariatric surgery.  Currently she is on losartan 25 mg daily and atenolol 50 mg bid.  She was intolerant to the recently prescribed hydrochlorothiazide and hydralazine .  She notes blood pressure is elevated in the morning but controlled better throughout the day.    Palpitations:She has had intermittent palpitations. Palpitations have decreased with the addition of beta-blocker to her regimen. No significant events on a recent 24-hour Holter monitor. Last year she did wear a 30-day event monitor that noted occasional PVCs.    Cerebrovascular disease: History of a TIA. She has had no recurrence. This TIA occurred when she was not on aspirin. Currently compliant with aspirin.    Cardiac work up has included    Echocardiogram (04/04/2020): Normal LV/RV size and systolic function. Ejection fraction 60%. Mild left ventricular hypertrophy. No significant valvular heart disease. No pericardial effusion.      - stress test (11/06/17 - normal myocardial perfusion)  - cardiac cath ( 01/08/14 - normal )  - echocardiogram ( EF 55%, mild  TR)  - 24 hour holter - no significant events      MEDICATIONS:   .  aspirin EC 81 MG EC tablet, Take 1 tablet (81 mg total) by mouth daily.  Marland Kitchen  atenolol (TENORMIN) 50 MG tablet, Take 1 tablet (50 mg total) by mouth 2 (two) times daily Take 50 mg by mouth 2 (two) times daily  .  Cyanocobalamin (B-12 IJ), Inject as directed every 30 (thirty) days    .  diazePAM (VALIUM) 5 MG tablet, Take 1 tablet (5 mg total) by mouth every 8 (eight) hours as needed for Anxiety  .  famotidine (PEPCID) 20 MG tablet, Take 1 tablet (20 mg total) by mouth daily  .  fluticasone (FLONASE) 50 MCG/ACT nasal spray, 2 sprays by Nasal route daily. (Patient taking differently: 2 sprays by Nasal route as needed.  )  .  lidocaine (XYLOCAINE) 2 % jelly, Apply topically as needed (sore)  .  losartan (COZAAR) 25 MG tablet, Take 1 tablet (25 mg total) by mouth every evening  .  Multiple Vitamin (MULTIVITAMIN) capsule, Take 1 capsule by mouth daily  .  ondansetron (ZOFRAN-ODT) 4 MG disintegrating tablet, Take 1 tablet (4 mg total) by mouth every 6 (six) hours as needed for Nausea  .  pantoprazole (PROTONIX) 40 MG tablet, Take 1 tablet (40 mg total) by mouth daily  .  valACYclovir HCL (VALTREX) 500 MG tablet, TAKE 1 TABLET(500 MG) BY MOUTH DAILY  .  vitamin D, ergocalciferol, (DRISDOL) 50000 UNIT Cap, Take 1 capsule (50,000 Units total) by mouth once a week    PHYSICAL EXAMINATION  Vital Signs: BP  143/89   Pulse 62   Ht 1.549 m (5\' 1" )   Wt 68.9 kg (152 lb)   LMP  (LMP Unknown)   BMI 28.72 kg/m    The following limited physical exam components were observed via video:  General Appearance:  Alert, no distress   Lungs:   Breathing comfortably without accessory muscle use.   Extremities: No apparent edema     LABS:   Lab Results   Component Value Date    WBC 5.09 10/06/2020    HGB 13.1 10/06/2020    HCT 40.4 10/06/2020    PLT 232 10/06/2020    NA 143 10/06/2020    K 3.7 10/06/2020    MG 2.0 06/16/2018    BUN 8.0 10/06/2020    CREAT 0.7  10/06/2020    GLU 89 10/06/2020    AST 18 03/19/2020    ALT 14 03/19/2020    HGBA1C 5.0 01/25/2018    TSH 1.09 10/04/2019    TROPI <0.01 10/06/2020    DDIMER 0.28 01/24/2018     Recent Labs     10/04/19  0956 08/16/18  0734 01/25/18  0506 11/10/17  0740 04/30/17  0719   Cholesterol 196 187 151 175 187   Triglycerides 40 61 26* 53 55   HDL 73 80 53 72 90   LDL Calculated 115* 92 93 89 83        IMPRESSION/RECOMMENDATIONS: Amber [name] is a 70 y.o. female presents for cardiac follow-up of hypertension.  We will continue atenolol 50 mg twice a day.  Currently she is taking atenolol at 8 AM and at noon.  Recommend taking atenolol at 8 AM and take her p.m. dose with dinner.  That she did improve her early morning elevated blood pressure recordings.  Continue losartan 25 mg daily.    Preop evaluation: There are no cardiac contraindications to proceeding with surgery.  She has a good functional capacity, without cardiac symptoms.  Here left ventricular systolic function is normal.  Her most recent ischemic work-up have been negative.  Okay to proceed with surgery,/anesthesia from a cardiac perspective with acceptable risk.  No further cardiac work-up warranted prior to surgery.    Follow-up with cardiology in 3 months

## 2020-11-25 ENCOUNTER — Encounter (INDEPENDENT_AMBULATORY_CARE_PROVIDER_SITE_OTHER): Payer: Self-pay | Admitting: Internal Medicine

## 2020-11-25 ENCOUNTER — Ambulatory Visit (INDEPENDENT_AMBULATORY_CARE_PROVIDER_SITE_OTHER): Payer: Medicare Other | Admitting: Internal Medicine

## 2020-11-25 VITALS — BP 173/88 | HR 58 | Temp 97.2°F | Resp 14 | Ht 61.0 in | Wt 153.2 lb

## 2020-11-25 DIAGNOSIS — Z01818 Encounter for other preprocedural examination: Secondary | ICD-10-CM

## 2020-11-25 DIAGNOSIS — F419 Anxiety disorder, unspecified: Secondary | ICD-10-CM

## 2020-11-25 DIAGNOSIS — I10 Essential (primary) hypertension: Secondary | ICD-10-CM

## 2020-11-25 DIAGNOSIS — M1712 Unilateral primary osteoarthritis, left knee: Secondary | ICD-10-CM

## 2020-11-25 LAB — URINALYSIS WITH MICROSCOPIC
Bilirubin, UA: NEGATIVE
Blood, UA: NEGATIVE
Glucose, UA: NEGATIVE
Ketones UA: NEGATIVE
Leukocyte Esterase, UA: NEGATIVE
Nitrite, UA: NEGATIVE
Protein, UR: NEGATIVE
Specific Gravity UA: 1.02 (ref 1.001–1.035)
Urine Bacteria: NONE SEEN /hpf
Urine pH: 6 (ref 5.0–8.0)
Urobilinogen, UA: 1 (ref 0.2–2.0)

## 2020-11-25 LAB — CBC AND DIFFERENTIAL
Absolute NRBC: 0 10*3/uL (ref 0.00–0.00)
Basophils Absolute Automated: 0.03 10*3/uL (ref 0.00–0.08)
Basophils Automated: 0.6 %
Eosinophils Absolute Automated: 0.07 10*3/uL (ref 0.00–0.44)
Eosinophils Automated: 1.3 %
Hematocrit: 42.9 % (ref 34.7–43.7)
Hgb: 13.7 g/dL (ref 11.4–14.8)
Immature Granulocytes Absolute: 0.01 10*3/uL (ref 0.00–0.07)
Immature Granulocytes: 0.2 %
Lymphocytes Absolute Automated: 1.28 10*3/uL (ref 0.42–3.22)
Lymphocytes Automated: 24.2 %
MCH: 29.1 pg (ref 25.1–33.5)
MCHC: 31.9 g/dL (ref 31.5–35.8)
MCV: 91.3 fL (ref 78.0–96.0)
MPV: 10.4 fL (ref 8.9–12.5)
Monocytes Absolute Automated: 0.49 10*3/uL (ref 0.21–0.85)
Monocytes: 9.3 %
Neutrophils Absolute: 3.4 10*3/uL (ref 1.10–6.33)
Neutrophils: 64.4 %
Nucleated RBC: 0 /100 WBC (ref 0.0–0.0)
Platelets: 271 10*3/uL (ref 142–346)
RBC: 4.7 10*6/uL (ref 3.90–5.10)
RDW: 13 % (ref 11–15)
WBC: 5.28 10*3/uL (ref 3.10–9.50)

## 2020-11-25 LAB — COMPREHENSIVE METABOLIC PANEL
ALT: 12 U/L (ref 0–55)
AST (SGOT): 16 U/L (ref 5–34)
Albumin/Globulin Ratio: 1.4 (ref 0.9–2.2)
Albumin: 3.8 g/dL (ref 3.5–5.0)
Alkaline Phosphatase: 93 U/L (ref 37–117)
Anion Gap: 6 (ref 5.0–15.0)
BUN: 13 mg/dL (ref 7.0–19.0)
Bilirubin, Total: 0.3 mg/dL (ref 0.2–1.2)
CO2: 28 mEq/L (ref 21–29)
Calcium: 9 mg/dL (ref 8.5–10.5)
Chloride: 107 mEq/L (ref 100–111)
Creatinine: 0.8 mg/dL (ref 0.4–1.5)
Globulin: 2.8 g/dL (ref 2.0–3.7)
Glucose: 90 mg/dL (ref 70–100)
Potassium: 4.2 mEq/L (ref 3.5–5.1)
Protein, Total: 6.6 g/dL (ref 6.0–8.3)
Sodium: 141 mEq/L (ref 136–145)

## 2020-11-25 LAB — GFR: EGFR: >60

## 2020-11-25 LAB — HEMOLYSIS INDEX: Hemolysis Index: 7 (ref 0–24)

## 2020-11-25 LAB — HEMOGLOBIN A1C
Average Estimated Glucose: 91.1 mg/dL
Hemoglobin A1C: 4.8 % (ref 4.6–5.9)

## 2020-11-25 NOTE — Progress Notes (Signed)
Have you seen any specialists/other providers since your last visit with [place]?    Yes, ortho   Arm preference verified?   Yes    The patient is due for pneumonia vaccine and covid-19 booster, MAWV

## 2020-11-25 NOTE — Progress Notes (Signed)
Subjective:      Patient ID: Amber [name] is a 70 y.o. female.    Chief Complaint:  Chief Complaint   Patient presents with   . Pre-op Exam     left knee surgery 12/11/2020       HPI:  HPI  Pt is here for a pre-op medical clearance.     Scheduled for left knee replacement surgery 12/11/20, with Dr. Loraine Grip coat HTN, labile, managed by card, home BP 120's-150's/80's, +intolerant to several HTN meds   Cleared by card for surgery    Asthma, stable, rare need for albuterol   OSA, stable on CPAP    Problem List:  Patient Active Problem List   Diagnosis   . Essential hypertension   . Anxiety state, unspecified   . Abnormal electrocardiogram   . Cerebral infarction   . Gastroesophageal reflux disease   . Hypernatremia   . Paresthesia   . Slow transit constipation   . Vitamin D deficiency   . Overactive bladder   . S/P laparoscopic cholecystectomy   . Transient cerebral ischemia, unspecified type   . Palpitations   . Pituitary microadenoma   . Dyslipidemia   . Other long term (current) drug therapy   . Headache   . History of spinal surgery   . Obstructive sleep apnea syndrome   . Hiatal hernia   . Osteoarthritis of knees, bilateral   . Bariatric surgery status   . Nutritional deficiency   . Anxiety   . Epigastric abdominal tenderness without rebound tenderness   . Encounter for vitamin deficiency screening   . Nausea   . Other dysphagia   . Small bowel obstruction due to adhesions   . Allergic rhinitis   . Chronic frontal sinusitis   . Chronic sphenoidal sinusitis   . Sinusitis   . Primary osteoarthritis of both knees   . Labile hypertension   . Postsurgical dumping syndrome   . Hypercholesterolemia   . History of chronic sinusitis   . Mild intermittent asthma without complication       Current Medications:  Current Outpatient Medications   Medication Sig Dispense Refill   . aspirin EC 81 MG EC tablet Take 1 tablet (81 mg total) by mouth daily. 30 tablet 1   . atenolol (TENORMIN) 50 MG tablet Take 1 tablet (50 mg  total) by mouth 2 (two) times daily Take 50 mg by mouth 2 (two) times daily 180 tablet 1   . Cyanocobalamin (B-12 IJ) Inject as directed every 30 (thirty) days        . diazePAM (VALIUM) 5 MG tablet Take 1 tablet (5 mg total) by mouth every 8 (eight) hours as needed for Anxiety 30 tablet 2   . famotidine (PEPCID) 20 MG tablet Take 1 tablet (20 mg total) by mouth daily 90 tablet 1   . fluticasone (FLONASE) 50 MCG/ACT nasal spray 2 sprays by Nasal route daily. (Patient taking differently: 2 sprays by Nasal route as needed.   ) 16 g 2   . lidocaine (XYLOCAINE) 2 % jelly Apply topically as needed (sore) 30 mL 1   . losartan (COZAAR) 25 MG tablet Take 1 tablet (25 mg total) by mouth every evening 90 tablet 1   . Multiple Vitamin (MULTIVITAMIN) capsule Take 1 capsule by mouth daily     . ondansetron (ZOFRAN-ODT) 4 MG disintegrating tablet Take 1 tablet (4 mg total) by mouth every 6 (six) hours as needed for Nausea 8  tablet 0   . pantoprazole (PROTONIX) 40 MG tablet Take 1 tablet (40 mg total) by mouth daily 90 tablet 1   . valACYclovir HCL (VALTREX) 500 MG tablet TAKE 1 TABLET(500 MG) BY MOUTH DAILY 90 tablet 1   . vitamin D, ergocalciferol, (DRISDOL) 50000 UNIT Cap Take 1 capsule (50,000 Units total) by mouth once a week 12 capsule 1     No current facility-administered medications for this visit.       Allergies:  Allergies   Allergen Reactions   . Acyclovir Nausea And Vomiting     Other reaction(s): gi distress   . Amlodipine      nausea   . Amoxicillin-Pot Clavulanate      Other reaction(s): gi distress     . Atorvastatin        Palpitation   . Carvedilol      Extreme fatigue   . Hydralazine      Headache     . Lisinopril      nausea   . Lovastatin Nausea And Vomiting     Other reaction(s): gi distress   . Metronidazole Nausea And Vomiting     Other reaction(s): gi distress   . Moxifloxacin Nausea And Vomiting         GI symptoms   . Rosuvastatin      Palpitation, chest pain, abd pain    . Statins      Chest pain,  palpitations.   . Sulfa Antibiotics Nausea And Vomiting     Other reaction(s): gi distress       Past Medical History:  Past Medical History:   Diagnosis Date   . Asthma    . Bronchitis 01/02/2019   . Chest pain 2016     Chest pain after eating x6  months--being followed by GI for GERD   . Claustrophobia     MRIs   . Constipation    . Genital herpes     no current outbreak (10/03/15)   . GERD (gastroesophageal reflux disease)    . Hiatal hernia     h/o surgery   . Hyperlipidemia    . Hypertensive disorder     Well controlled on meds per pt. Elevated with pain.   . Low back pain    . Nausea without vomiting    . OSA on CPAP 2015    CPAP nightly x 1 yr, used nightly.   . Osteoarthritis of knees, bilateral     and back   . SBO (small bowel obstruction) 09/2018   . TIA (transient ischemic attack) 2014    TIA 2014-no residual. Patient evaulated for possible TIA 07/12/2015  @ [facility] (dischage summary in epic)-per summary CT of head was normal and patient was d/c'd       Past Surgical History:  Past Surgical History:   Procedure Laterality Date   . APPENDECTOMY (OPEN)  >25 yrs   . CHOLECYSTECTOMY     . EGD  01/2015   . EGD N/A 01/23/2019    Procedure: EGD;  Surgeon: Champ Mungo, MD;  Location: [facility];  Service: General;  Laterality: N/A;  ENDOSCOPY  MD REQ=30MINS; Q1=UNK   . EGD, BIOPSY N/A 09/15/2017    Procedure: EGD, BIOPSY;  Surgeon: Pershing Proud, MD;  Location: [facility];  Service: Gastroenterology;  Laterality: N/A;   . fallopian tube surgery  >25 yrs   . INSERTION, PAIN PUMP (MEDICAL) N/A 10/22/2015    Procedure: INSERTION, PAIN PUMP (MEDICAL);  Surgeon: Nicola Police, DO;  Location: [facility];  Service: General;  Laterality: N/A;   . LAPAROSCOPIC, CHOLECYSTECTOMY, CHOLANGIOGRAM  08/13/2014   . LAPAROSCOPIC, GASTRIC BYPASS N/A 10/22/2015    Procedure: LAPAROSCOPIC, GASTRIC BYPASS;  Surgeon: Jeanell Sparrow, Hamid R, DO;  Location: [facility];  Service: General;  Laterality: N/A;   .  LAPAROSCOPIC, HERNIORRHAPHY, HIATAL N/A 10/22/2015    Procedure: LAPAROSCOPIC, HERNIORRHAPHY, HIATAL;  Surgeon: Nicola Police, DO;  Location: [facility];  Service: General;  Laterality: N/A;   . LAPAROSCOPIC, LYSIS, ADHESIONS N/A 09/01/2018    Procedure: LAPAROSCOPIC, LYSIS, ADHESIONS RELEASE OF  SMALL BOWEL OBSTRUCTION;  Surgeon: Champ Mungo, MD;  Location: [facility];  Service: General;  Laterality: N/A;   . LAPAROSCOPY, DIAGNOSTIC N/A 09/01/2018    Procedure: LAPAROSCOPY, DIAGNOSTIC;  Surgeon: Champ Mungo, MD;  Location:  MAIN OR;  Service: General;  Laterality: N/A;   . OVARY SURGERY Right >25 yrs   . SPINE SURGERY  11/2013    cervical HNP x 2, Dr. Bufford Buttner       Family History:  Family History   Problem Relation Age of Onset   . Cancer Mother 42        breast cancer   . Breast cancer Mother    . Heart disease Father    . Malignant hyperthermia Neg Hx    . Pseudochol deficiency Neg Hx        Social History:  Social History     Socioeconomic History   . Marital status: Single   . Number of children: 2   Occupational History   . Occupation: Acupuncturist: [employer]    Tobacco Use   . Smoking status: Never Smoker   . Smokeless tobacco: Never Used   Vaping Use   . Vaping Use: Never used   Substance and Sexual Activity   . Alcohol use: Never   . Drug use: Never   . Sexual activity: Never     Birth control/protection: Post-menopausal   Other Topics Concern   . Dietary supplements / vitamins Yes   . Anesthesia problems No   . Blood thinners No   . Eats large amounts No   . Excessive Sweets No   . Skips meals No   . Eats excessive starches Yes   . Snacks or grazes No   . Emotional eater Yes   . Eats fried food Yes   . Eats fast food Yes   . Diet Center No   . HMR No   . [name] No   . [place] Weight Loss No   . Nutri-System No   . Opti-Fast / Medi-Fast No   . Overeaters Anonymous No   . [facility] Weight Loss Center No   . TOPS No   . Weight Watchers No   .  Atkins No   . Binging / Purging No   . Body for Life No   . Cabbage Soup No   . Calorie Counting No   . Fasting No   . [name] No   . Health Spa No   . Herbal Life No   . High Protein No   . Low Carb No   . Low Fat No   . [facility] Diet No   . [place] Diet No   . Margie Billet Diet No   . [place] Diet No   . Slim Fast  No   . [place] No   . Sugar Busters No   . Vomiting No   . Zone Diet No   . Stationary cycle or treadmill No   . Gym/fitness Classes No   . Home exercise/video No   . Swimming No   . Team sports No   . Weight training No   . Walking or running No   . Hospitalization No   . Hypnosis No   . Physical therapy No   . Psychological therapy No   . Residential program No   . Acutrim No   . Amphetamines No   . Anorex No   . Byetta No   . Dexatrim No   . Didrex No   . Fastin No   . Fen - Phen No   . Ionamin / Adipex No   . Mazanor No   . Meridia No   . Obalan No   . Phendiet No   . Phentrol No   . Phentermine Yes   . Plegine No   . Pondimin No   . Qsymia No   . Prozac No   . Redux No   . Sanorex No   . Tenuate No   . Tepanole No   . Wechless No   . Wellbutrin No   . Xenical (Orlistat, Alli) No   . Other Med No   . No impairment Yes     Comment: Chronic Bilat Knee Pain   . Walks with cane/crutch Yes   . Requires a wheelchair No   . Bedridden No   . Are you currently being treated for depression? No   . Do you snore? Yes   . Are you receiving any medical or psychological services? No   . Do you have or have you been treated for an eating disorder? No   . Do you exercise regularly? No   . Have you or family member ever have trouble with anesthesia? No        The following sections were reviewed this encounter by the provider:   Tobacco  Meds  Problems  Surg Hx  Fam Hx  Soc Hx        ROS:  Review of Systems   Constitutional: Negative for appetite change, chills, fever and unexpected weight change.   Respiratory: Negative for shortness of breath and wheezing.    Cardiovascular: Negative for  chest pain and palpitations.   Gastrointestinal: Negative for abdominal pain, constipation, diarrhea, nausea and vomiting.   Genitourinary: Negative for dysuria, frequency and hematuria.   Musculoskeletal: Positive for arthralgias.   Neurological: Negative for syncope and light-headedness.   Hematological: Does not bruise/bleed easily.       Vitals:  BP 173/88 (BP Site: Right arm, Patient Position: Sitting, Cuff Size: Medium)   Pulse (!) 58   Temp 97.2 F (36.2 C) (Temporal)   Resp 14   Ht 1.549 m (5\' 1" )   Wt 69.5 kg (153 lb 3.2 oz)   LMP  (LMP Unknown)   BMI 28.95 kg/m      Objective:     Physical Exam:  Physical Exam  Constitutional:       General: She is not in acute distress.     Appearance: She is well-developed.   HENT:      Mouth/Throat:      Pharynx: No oropharyngeal exudate.   Eyes:      General: No scleral icterus.     Conjunctiva/sclera: Conjunctivae normal.  Neck:      Thyroid: No thyromegaly.      Vascular: No carotid bruit or JVD.   Cardiovascular:      Rate and Rhythm: Normal rate and regular rhythm.      Heart sounds: Normal heart sounds. No murmur heard.  No friction rub. No gallop.    Pulmonary:      Effort: Pulmonary effort is normal. No respiratory distress.      Breath sounds: Normal breath sounds. No rales.   Abdominal:      General: Bowel sounds are normal. There is no distension.      Palpations: Abdomen is soft.      Tenderness: There is no abdominal tenderness. There is no guarding or rebound.   Musculoskeletal:         General: Tenderness present.      Cervical back: Neck supple.      Right lower leg: No edema.      Left lower leg: No edema.      Comments: +crepitus and tenderness bilat knees     EKG NSR, +LVH, no acute ischemic changes     Assessment:     1. Pre-op examination  - CBC and differential  - Comprehensive metabolic panel  - Hemoglobin A1C  - Urinalysis with microscopic  - PT/APTT  - MRSA culture  - XR Chest 2 Views; Future  - PR ECG ROUTINE ECG W/LEAST 12 LDS  W/I&R    2. Primary osteoarthritis of left knee    3. White coat syndrome with diagnosis of hypertension    4. Essential hypertension    5. Anxiety      Plan:     Pre op labs ordered  Cleared for surgery pending test results.   Continue meds  Monitor home BP  Keep f/u with card, specialists as directed  Keep routine f/u ov    Sybol Morre Karie Kirks, MD

## 2020-11-26 ENCOUNTER — Ambulatory Visit
Admission: RE | Admit: 2020-11-26 | Discharge: 2020-11-26 | Disposition: A | Discharge status: Home / Self Care | Payer: Medicare Other | Source: Ambulatory Visit | Attending: Internal Medicine | Admitting: Internal Medicine

## 2020-11-26 ENCOUNTER — Encounter (INDEPENDENT_AMBULATORY_CARE_PROVIDER_SITE_OTHER): Payer: Self-pay | Admitting: Cardiology

## 2020-11-26 DIAGNOSIS — Z01818 Encounter for other preprocedural examination: Secondary | ICD-10-CM | POA: Insufficient documentation

## 2020-11-26 LAB — PT AND APTT
PT INR: 1 (ref 0.9–1.1)
PT: 12 s (ref 10.1–12.9)
PTT: 34 s (ref 27–39)

## 2020-11-26 LAB — MRSA CULTURE: Culture MRSA Surveillance: NEGATIVE

## 2020-11-26 NOTE — Progress Notes (Signed)
Chest xray is normal

## 2020-11-27 NOTE — Progress Notes (Signed)
Blood tests are normal    Cleared for surgery

## 2020-11-28 ENCOUNTER — Encounter (INDEPENDENT_AMBULATORY_CARE_PROVIDER_SITE_OTHER): Payer: Self-pay

## 2020-12-05 ENCOUNTER — Encounter (INDEPENDENT_AMBULATORY_CARE_PROVIDER_SITE_OTHER): Payer: Self-pay | Admitting: Internal Medicine

## 2020-12-10 ENCOUNTER — Other Ambulatory Visit (INDEPENDENT_AMBULATORY_CARE_PROVIDER_SITE_OTHER): Payer: Self-pay

## 2020-12-10 ENCOUNTER — Encounter (INDEPENDENT_AMBULATORY_CARE_PROVIDER_SITE_OTHER): Payer: Self-pay

## 2020-12-10 NOTE — Progress Notes (Signed)
12/10/20 1540   Pt Outreach/Care Plan Metrics   [employer] for Outreach MSSP   Outreach Status for Metrics listing Left Voicemail 1  (No patient answer)     Nurse Navigator placed a call to patient to introduce care management program explaining services and support. Patient did not answer. Left voicemail message with contact information for questions or concerns.     Arva Chafe., RN, BSN, CDCES, ACM-RN   Patient Care Nurse Navigator  Ambulatory Care Management   T [phone]

## 2020-12-11 ENCOUNTER — Encounter (INDEPENDENT_AMBULATORY_CARE_PROVIDER_SITE_OTHER): Payer: Self-pay

## 2020-12-13 ENCOUNTER — Other Ambulatory Visit (INDEPENDENT_AMBULATORY_CARE_PROVIDER_SITE_OTHER): Payer: Self-pay

## 2020-12-13 NOTE — Progress Notes (Signed)
12/13/20 1613   Pt Outreach/Care Plan Metrics   Payor Grouping for Outreach MSSP   Outreach Status for Metrics listing Left Voicemail 2  (No patient answer)     Nurse Navigator placed a call to patient to introduce care management program explaining services and support. Patient did not answer. Left voicemail message with contact information for questions or concerns.     Arva Chafe., RN, BSN, CDCES, ACM-RN   Patient Care Nurse Navigator  Ambulatory Care Management   T [phone]

## 2020-12-17 ENCOUNTER — Other Ambulatory Visit (INDEPENDENT_AMBULATORY_CARE_PROVIDER_SITE_OTHER): Payer: Self-pay

## 2020-12-17 NOTE — Progress Notes (Signed)
12/17/20 1354   Pt Outreach/Care Plan Metrics   Payor Grouping for Outreach MSSP   Outreach Status for [employer] Spoke with Member/Care Giver;Left Voicemail 3;Unable to Reach  (No patient answer)     Nurse Navigator placed a call to patient to introduce care management program explaining services and support. Patient did not answer after third call. Unable to reach. Left voicemail message with contact information for questions or concerns.     Arva Chafe., RN, BSN, CDCES, ACM-RN   Patient Care Nurse Navigator  Ambulatory Care Management   T [phone]

## 2020-12-19 ENCOUNTER — Other Ambulatory Visit (INDEPENDENT_AMBULATORY_CARE_PROVIDER_SITE_OTHER): Payer: Self-pay

## 2020-12-19 NOTE — Progress Notes (Signed)
12/19/20 1611   General Assessment   Assessment completed with patient   Cognitive Issues No   Patient lives with children  (Pt. lives with adult son)   Capacity/Guardianship Issue No   Pain Yes  (Pt. had left knee replacement on 12/11/2020)   Severity of pain Moderate  (Pt. stated that it's severe when she hasn't had pain medicine)   Housing single family/private residence   Support system children;family;friends   Current home care services No   DME Used Environmental consultant   Limited Mobility Yes  (Left knee replacement)   Limited Mobility List Walking   Transportation issues No   Medication  No   Financial  No   Abuse or neglect No   Substance Abuse No   Housing concerns No   Health Literacy Assessed No   No consistent medical care No   Visual Impairment Yes   Visual Impairment List Glasses  (Wears glasses for reading or driving)   Hearing Impaired No   Religious or cultural barriers to wellness No   Coping Skills No   Coping Skills List Faith Based   Diet - Type Regular   Exercise No  (But works with PT 3 times per week)   Exercise Barriers Pain  (Left knee replacement)       Arva Chafe., RN, BSN, CDCES, ACM-RN   Patient Care Nurse Navigator  Ambulatory Care Management   T [phone]

## 2020-12-19 NOTE — Progress Notes (Addendum)
Care Plan      Name: Amber [name]     MRN: [id number]       DOB:   January 07, 1951     PCP: Oneita Jolly, MD      Advance Directives:  N     Communication Preferences: Telephone preference     Diagnosis:  Patient Active Problem List   Diagnosis   . Essential hypertension   . Anxiety state, unspecified   . Abnormal electrocardiogram   . Cerebral infarction   . Gastroesophageal reflux disease   . Hypernatremia   . Paresthesia   . Slow transit constipation   . Vitamin D deficiency   . Overactive bladder   . S/P laparoscopic cholecystectomy   . Transient cerebral ischemia, unspecified type   . Palpitations   . Pituitary microadenoma   . Dyslipidemia   . Other long term (current) drug therapy   . Headache   . History of spinal surgery   . Obstructive sleep apnea syndrome   . Hiatal hernia   . Osteoarthritis of knees, bilateral   . Bariatric surgery status   . Nutritional deficiency   . Anxiety   . Epigastric abdominal tenderness without rebound tenderness   . Encounter for vitamin deficiency screening   . Nausea   . Other dysphagia   . Small bowel obstruction due to adhesions   . Allergic rhinitis   . Chronic frontal sinusitis   . Chronic sphenoidal sinusitis   . Sinusitis   . Primary osteoarthritis of both knees   . Labile hypertension   . Postsurgical dumping syndrome   . Hypercholesterolemia   . History of chronic sinusitis   . Mild intermittent asthma without complication       Patient Care Team:  Oneita Jolly, MD as PCP - General (Internal Medicine)  Darnelle Maffucci, MD as Consulting Physician (Cardiology)  Brett Canales, MD as Consulting Physician (Gastroenterology)  Renard Hamper, MD as Consulting Physician (Clinical Cardiac Electrophysiology)  Vedia Coffer, MD as Consulting Physician (Critical Care Medicine)  Nicola Police, DO as Consulting Physician (Surgery)  Press, Tyler Deis, MD as Consulting Physician (Orthopaedic Surgery)  Tommye Standard, MD as Consulting Physician (Surgery)  Candace Cruise, FNP as Nurse Practitioner (Family Nurse Practitioner)  Si Raider, RN as Case Manager    Health Maintenance   Topic Date Due   . Pneumonia Vaccine Age 6+ (1 of 2 - PPSV23) 01/23/2019   . MAMMOGRAM  01/02/2020   . Medicare Annual Wellness Visit  03/28/2020   . COVID-19 Vaccine (3 - Booster for Pfizer series) 08/28/2020   . DEPRESSION SCREENING  07/16/2021   . FALLS RISK ANNUAL  08/15/2021   . Colorectal Cancer Screening  03/27/2026   . HEPATITIS C SCREENING  Completed   . Advance Directive on File  Completed   . INFLUENZA VACCINE  Completed   . DXA Scan  Completed   . PAP SMEAR  Discontinued   . Shingrix Vaccine 50+  Discontinued         Immunization History   Administered Date(s) Administered   . COVID-19 mRNA vaccine 12 years and above [employer] Cap) 30 mcg/0.3 mL 02/06/2020, 02/27/2020   . INFLUENZA HIGH DOSE 65 YRS+ Quad 0.7 mL 10/15/2020   . INFLUENZA QUADRIVALENT ADJUVANTED PF 65 YRS & OLDER (FLUAD) 12/08/2019   . Influenza Vacc QUAD Recombinant PF 29yrs & up 11/28/2018   . Influenza quadrivalent (IM) PF 3 Yrs & greater  11/03/2016   . Pneumococcal Conjugate 13-Valent 11/28/2018   . Td 02/26/1999            Allergies   Allergen Reactions   . Acyclovir Nausea And Vomiting     Other reaction(s): gi distress   . Amlodipine      nausea   . Amoxicillin-Pot Clavulanate      Other reaction(s): gi distress     . Atorvastatin        Palpitation   . Carvedilol      Extreme fatigue   . Hydralazine      Headache     . Lisinopril      nausea   . Lovastatin Nausea And Vomiting     Other reaction(s): gi distress   . Metronidazole Nausea And Vomiting     Other reaction(s): gi distress   . Moxifloxacin Nausea And Vomiting         GI symptoms   . Rosuvastatin      Palpitation, chest pain, abd pain    . Statins      Chest pain, palpitations.   . Sulfa Antibiotics Nausea And Vomiting     Other reaction(s): gi distress          Current Outpatient Medications    Medication Sig Note   . aspirin EC 81 MG EC tablet Take 1 tablet (81 mg total) by mouth daily. 12/19/2020: Patient states she is taking Aspirin 81mg  twice a day for 6 weeks due to having had Left knee replacement on 12/11/2020.    Marland Kitchen atenolol (TENORMIN) 50 MG tablet Take 1 tablet (50 mg total) by mouth 2 (two) times daily Take 50 mg by mouth 2 (two) times daily    . Cyanocobalamin (B-12 IJ) Inject as directed every 30 (thirty) days       . diazePAM (VALIUM) 5 MG tablet Take 1 tablet (5 mg total) by mouth every 8 (eight) hours as needed for Anxiety    . famotidine (PEPCID) 20 MG tablet Take 1 tablet (20 mg total) by mouth daily 12/19/2020: Patient stated she takes as needed.    . fluticasone (FLONASE) 50 MCG/ACT nasal spray 2 sprays by Nasal route daily. (Patient taking differently: 2 sprays by Nasal route as needed.   )    . lidocaine (XYLOCAINE) 2 % jelly Apply topically as needed (sore)    . losartan (COZAAR) 25 MG tablet Take 1 tablet (25 mg total) by mouth every evening 12/19/2020: Patient takes this at noon daily.    . Multiple Vitamin (MULTIVITAMIN) capsule Take 1 capsule by mouth daily    . ondansetron (ZOFRAN-ODT) 4 MG disintegrating tablet Take 1 tablet (4 mg total) by mouth every 6 (six) hours as needed for Nausea    . pantoprazole (PROTONIX) 40 MG tablet Take 1 tablet (40 mg total) by mouth daily    . valACYclovir HCL (VALTREX) 500 MG tablet TAKE 1 TABLET(500 MG) BY MOUTH DAILY 12/19/2020: Patient stated as needed.    . vitamin D, ergocalciferol, (DRISDOL) 50000 UNIT Cap Take 1 capsule (50,000 Units total) by mouth once a week      Patient Care Plan  Nurse Navigator received a referral from the patient's Primary Care Provider for care management support. Nurse Navigator called patient to introduce care management program and discuss services. Patient answered. Explained role of Nurse Navigator and care management support.  Patient confirms Primary Care Provider (PCP) is Dr. Flonnie Overman Bui and insurance is  Medicare A and B.  Patient agrees to enroll in care management program and receive outreach calls from Nurse Navigator.       Patient is a 70 year old female with past medical history of HTN, GERD, OSA, Vitamin D deficiency, Dyslipidemia, Bariatric Surgery, Anxiety, Osteoarthritis, Asthma, Hypercholesterolemia.      Current Status:  Patient lives with her adult son but has cousins who live nearby. She is retired. She reported having a left knee replacement on 12/11/2020. She is recovering from that surgery. She stated that she had her first physical therapy session this week and will continue 3 times per week going forward. She currently uses a walker to ambulate due to her knee surgery.      Concerns:  Patient expressed concern about her pain from her left knee. She stated that as long as she takes her pain medicine, her pain is under control.     Goals:   1. Patient would like to walk properly up and down the stairs  2. Patient would like to walk about 5 minutes every day without pain.     Action Items:   1. Prevent flare ups and stay ahead of the pain  2. Utilize pain medicine when pain is more than usual amount and before major activities that can lead to triggers  3. Follow exercise instructions from physical therapy when at home  4. Utilize deep breathing techniques, distraction, visualization, and meditation to help with decreasing pain    Education provided to meet goals:   1. NN reviewed the importance of managing pain and preventing triggers and flare ups  2. NN reviewed the importance of a healthy diet contributing to a healthy recovery. It matters what food choices are eaten to help build up the immune system   3. NN reviewed the importance of having a healthy mindset such as:   *making healthy food and drink choices   *limiting alcohol and caffeine   *going to bed at about the same time every night and getting a restful night's sleep   *spending time with others, like family and friends to have a good  support network     Follow-Up:   12/27/2020@1 :30pm        Next follow up call with NN    Progress note and assessment routed to PCP who was made aware that patient enrolled in care management program. Patient gave permission to work with Nurse Navigator for care management support.  Patient is aware she can contact Nurse Navigator for any questions or concerns. Contact information provided.     Arva Chafe., RN, BSN, CDCES, ACM-RN   Patient Care Nurse Navigator  Ambulatory Care Management   T [phone]

## 2020-12-19 NOTE — Progress Notes (Signed)
12/19/20 1159   Pt Outreach/Care Plan Metrics   Payor Grouping for Outreach MSSP   Care Plan  Multi Comorbid   Outreach Status for Metrics listing Spoke with Member/Care Giver  (Spoke with patient)       Patient called this Nurse Navigator and requested assistance. She reported that she had knee replacement surgery last week on 12/11/2020. She has been taking Oxycodone/Acetaminophen (Percocet) 1 tab every 6 hours for pain. However, she stated that she's had an outbreak of herpes this week and wants to take Valcyclovir. She stated that she didn't know if she could take both medications at the same time. She wanted to relay question to Dr. Daivd Council. NN informed her that this message will be forwarded to Dr. Daivd Council for his review. NN will call patient once an answer is received from PCP. Patient agreed.     Arva Chafe., RN, BSN, CDCES, ACM-RN   Patient Care Nurse Navigator  Ambulatory Care Management   T [phone]

## 2020-12-27 ENCOUNTER — Other Ambulatory Visit (INDEPENDENT_AMBULATORY_CARE_PROVIDER_SITE_OTHER): Payer: Self-pay

## 2020-12-27 NOTE — Progress Notes (Signed)
12/27/20 1338   Pt Outreach/Care Plan Metrics   Payor Grouping for Outreach MSSP   Outreach Status for Metrics listing Left Voicemail 1  (No patient answer)     Nurse Navigator placed a call to patient as per scheduled phone appointment today at 1:30pm to discuss care management progress.  Patient did not answer. Left voicemail message with contact information for questions or concerns.     Arva Chafe., RN, BSN, CDCES, ACM-RN   Patient Care Nurse Navigator  Ambulatory Care Management   T [phone]

## 2020-12-31 ENCOUNTER — Encounter (INDEPENDENT_AMBULATORY_CARE_PROVIDER_SITE_OTHER): Payer: Self-pay | Admitting: Internal Medicine

## 2021-01-01 ENCOUNTER — Other Ambulatory Visit (INDEPENDENT_AMBULATORY_CARE_PROVIDER_SITE_OTHER): Payer: Self-pay

## 2021-01-01 NOTE — Progress Notes (Signed)
01/01/21 1048   Pt Outreach/Care Plan Metrics   Payor Grouping for Outreach MSSP   Outreach Status for Metrics listing Left Voicemail   (No patient answer)     Nurse Navigator placed a call to patient today to follow up on patient care progress. Patient did not answer. Left voicemail message with contact information for any questions or concerns.     Arva Chafe., RN, BSN, CDCES, ACM-RN   Patient Care Nurse Navigator  Ambulatory Care Management   T [phone]

## 2021-01-07 ENCOUNTER — Other Ambulatory Visit (INDEPENDENT_AMBULATORY_CARE_PROVIDER_SITE_OTHER): Payer: Self-pay | Admitting: Internal Medicine

## 2021-01-07 DIAGNOSIS — K219 Gastro-esophageal reflux disease without esophagitis: Secondary | ICD-10-CM

## 2021-01-08 ENCOUNTER — Encounter (INDEPENDENT_AMBULATORY_CARE_PROVIDER_SITE_OTHER): Payer: Self-pay

## 2021-01-14 ENCOUNTER — Other Ambulatory Visit (INDEPENDENT_AMBULATORY_CARE_PROVIDER_SITE_OTHER): Payer: Self-pay

## 2021-01-14 NOTE — Progress Notes (Signed)
01/14/21 1505   Pt Outreach/Care Plan Metrics   Payor Grouping for Outreach MSSP   Outreach Status for Metrics listing Left Voicemail   (No patient answer)     Nurse Navigator placed a call to patient today to follow up on patient care progress. Patient did not answer. Left voicemail message with contact information for any questions or concerns.       Arva Chafe., RN, BSN, CDCES, ACM-RN   Patient Care Nurse Navigator  Ambulatory Care Management   T [phone]

## 2021-01-20 ENCOUNTER — Emergency Department
Admission: EM | Admit: 2021-01-20 | Discharge: 2021-01-20 | Disposition: A | Discharge status: Home / Self Care | Payer: Medicare Other | Attending: Emergency Medicine | Admitting: Emergency Medicine

## 2021-01-20 ENCOUNTER — Telehealth (INDEPENDENT_AMBULATORY_CARE_PROVIDER_SITE_OTHER): Payer: Self-pay

## 2021-01-20 DIAGNOSIS — R110 Nausea: Secondary | ICD-10-CM | POA: Insufficient documentation

## 2021-01-20 DIAGNOSIS — R079 Chest pain, unspecified: Secondary | ICD-10-CM

## 2021-01-20 DIAGNOSIS — I10 Essential (primary) hypertension: Secondary | ICD-10-CM | POA: Insufficient documentation

## 2021-01-20 DIAGNOSIS — Z96652 Presence of left artificial knee joint: Secondary | ICD-10-CM | POA: Insufficient documentation

## 2021-01-20 DIAGNOSIS — K5900 Constipation, unspecified: Secondary | ICD-10-CM | POA: Insufficient documentation

## 2021-01-20 DIAGNOSIS — G8918 Other acute postprocedural pain: Secondary | ICD-10-CM | POA: Insufficient documentation

## 2021-01-20 LAB — COMPREHENSIVE METABOLIC PANEL
ALT: 13 U/L (ref 0–55)
AST (SGOT): 14 U/L (ref 5–34)
Albumin/Globulin Ratio: 1.4 (ref 0.9–2.2)
Albumin: 3.9 g/dL (ref 3.5–5.0)
Alkaline Phosphatase: 89 U/L (ref 37–106)
Anion Gap: 8 (ref 5.0–15.0)
BUN: 7 mg/dL (ref 7.0–19.0)
Bilirubin, Total: 0.5 mg/dL (ref 0.2–1.2)
CO2: 27 mEq/L (ref 22–29)
Calcium: 9 mg/dL (ref 8.5–10.5)
Chloride: 108 mEq/L (ref 100–111)
Creatinine: 0.7 mg/dL (ref 0.6–1.0)
Globulin: 2.7 g/dL (ref 2.0–3.6)
Glucose: 85 mg/dL (ref 70–100)
Potassium: 3.9 mEq/L (ref 3.5–5.1)
Protein, Total: 6.6 g/dL (ref 6.0–8.3)
Sodium: 143 mEq/L (ref 136–145)

## 2021-01-20 LAB — HEPATIC FUNCTION PANEL
Bilirubin Direct: 0.2 mg/dL (ref 0.0–0.5)
Bilirubin Indirect: 0.3 mg/dL (ref 0.2–1.0)

## 2021-01-20 LAB — CBC AND DIFFERENTIAL
Absolute NRBC: 0 10*3/uL (ref 0.00–0.00)
Basophils Absolute Automated: 0.04 10*3/uL (ref 0.00–0.08)
Basophils Automated: 0.8 %
Eosinophils Absolute Automated: 0.13 10*3/uL (ref 0.00–0.44)
Eosinophils Automated: 2.6 %
Hematocrit: 40.8 % (ref 34.7–43.7)
Hgb: 13 g/dL (ref 11.4–14.8)
Immature Granulocytes Absolute: 0.01 10*3/uL (ref 0.00–0.07)
Immature Granulocytes: 0.2 %
Lymphocytes Absolute Automated: 0.96 10*3/uL (ref 0.42–3.22)
Lymphocytes Automated: 19.2 %
MCH: 28 pg (ref 25.1–33.5)
MCHC: 31.9 g/dL (ref 31.5–35.8)
MCV: 87.9 fL (ref 78.0–96.0)
MPV: 10.4 fL (ref 8.9–12.5)
Monocytes Absolute Automated: 0.41 10*3/uL (ref 0.21–0.85)
Monocytes: 8.2 %
Neutrophils Absolute: 3.45 10*3/uL (ref 1.10–6.33)
Neutrophils: 69 %
Nucleated RBC: 0 /100 WBC (ref 0.0–0.0)
Platelets: 283 10*3/uL (ref 142–346)
RBC: 4.64 10*6/uL (ref 3.90–5.10)
RDW: 14 % (ref 11–15)
WBC: 5 10*3/uL (ref 3.10–9.50)

## 2021-01-20 LAB — LIPASE: Lipase: 6 U/L — ABNORMAL LOW (ref 8–78)

## 2021-01-20 LAB — GFR: EGFR: >60

## 2021-01-20 MED ORDER — ACETAMINOPHEN 500 MG PO TABS
1000.0000 mg | ORAL_TABLET | Freq: Once | ORAL | Status: AC
Start: 2021-01-20 — End: 2021-01-20
  Administered 2021-01-20: 14:00:00 1000 mg via ORAL
  Filled 2021-01-20: qty 2

## 2021-01-20 NOTE — Telephone Encounter (Signed)
I spoke with pt who c/o high BP x 1 month post knee surgery.     She gave me readings for the past few days -   171/107 85  169/101 85   180/97 65  154/118 60  149/91 69    She has dizziness, stomach discomfort, nausea.    Compliant in taking -   Losartan 25mg   Atenolol 50mg  BID    I asked pt to go to urgent care to be evaluated.

## 2021-01-20 NOTE — ED Provider Notes (Addendum)
EMERGENCY DEPARTMENT NOTE     Patient initially seen and examined at   ED PHYSICIAN ASSIGNED     Date/Time Event User Comments    01/20/21 1053 Physician Assigned PATITUCCI, LAURA C. Patitucci, Assunta Gambles, DO assigned as Attending         ED MIDLEVEL (APP) ASSIGNED     Date/Time Event User Comments    01/20/21 1209 PA/NP Provider Assigned Janese Banks M. Edythe Lynn, FNP assigned as Nurse Practitioner          HISTORY OF PRESENT ILLNESS   Historian:Patient  Translator Used: no    Chief Complaint: Hypertension       HPI:       70 y.o. female presents for evaluation of hypertension. She reports having home readings of SBP 160-180 over past week. She has additional c/o nausea and abdominal cramps over past day. Called cardiologist today to check in regarding her BP readings and was told cardiologist is not in the office today and she should go to ED for assessment.   Denies fever, NVD, constipation, last BM yesterday, has been having ongoing issues with constipation, states it feels like gas pains  PSH of L knee replacement on 12/11/20. Has been using Percocet which was changed to tramadol for pain control; states it has been ineffective lately.     1. Location of symptoms: abdominal cramps  2. Onset of symptoms: yesterday  3. What was patient doing when symptoms started (Context): see above  4. Severity: moderate  5. Timing: intermittent   6. Activities that worsen symptoms: none identified  7. Activities that improve symptoms: none identified  8. Quality: cramps  9. Radiation of symptoms: no  10. Associated signs and Symptoms: see above  11. Are symptoms worsening? Not improving  MEDICAL HISTORY     Past Medical History:  Past Medical History:   Diagnosis Date   . Asthma    . Bronchitis 01/02/2019   . Chest pain 2016     Chest pain after eating x6  months--being followed by GI for GERD   . Claustrophobia     MRIs   . Constipation    . Genital herpes     no current outbreak (10/03/15)   . GERD (gastroesophageal  reflux disease)    . Hiatal hernia     h/o surgery   . Hyperlipidemia    . Hypertensive disorder     Well controlled on meds per pt. Elevated with pain.   . Low back pain    . Nausea without vomiting    . OSA on CPAP 2015    CPAP nightly x 1 yr, used nightly.   . Osteoarthritis of knees, bilateral     and back   . SBO (small bowel obstruction) 09/2018   . TIA (transient ischemic attack) 2014    TIA 2014-no residual. Patient evaulated for possible TIA 07/12/2015  @ [facility] (dischage summary in epic)-per summary CT of head was normal and patient was d/c'd       Past Surgical History:  Past Surgical History:   Procedure Laterality Date   . APPENDECTOMY (OPEN)  >25 yrs   . CHOLECYSTECTOMY     . EGD  01/2015   . EGD N/A 01/23/2019    Procedure: EGD;  Surgeon: Champ Mungo, MD;  Location: [facility];  Service: General;  Laterality: N/A;  ENDOSCOPY  MD REQ=30MINS; Q1=UNK   . EGD, BIOPSY N/A 09/15/2017    Procedure: EGD, BIOPSY;  Surgeon:  Pershing Proud, MD;  [facility];  Service: Gastroenterology;  Laterality: N/A;   . fallopian tube surgery  >25 yrs   . INSERTION, PAIN PUMP (MEDICAL) N/A 10/22/2015    Procedure: INSERTION, PAIN PUMP (MEDICAL);  Surgeon: Nicola Police, DO;  Location: [facility];  Service: General;  Laterality: N/A;   . JOINT REPLACEMENT     . LAPAROSCOPIC, CHOLECYSTECTOMY, CHOLANGIOGRAM  08/13/2014   . LAPAROSCOPIC, GASTRIC BYPASS N/A 10/22/2015    Procedure: LAPAROSCOPIC, GASTRIC BYPASS;  Surgeon: Jeanell Sparrow, Hamid R, DO;  Location: [facility];  Service: General;  Laterality: N/A;   . LAPAROSCOPIC, HERNIORRHAPHY, HIATAL N/A 10/22/2015    Procedure: LAPAROSCOPIC, HERNIORRHAPHY, HIATAL;  Surgeon: Nicola Police, DO;  Location: [name];  Service: General;  Laterality: N/A;   . LAPAROSCOPIC, LYSIS, ADHESIONS N/A 09/01/2018    Procedure: LAPAROSCOPIC, LYSIS, ADHESIONS RELEASE OF  SMALL BOWEL OBSTRUCTION;  Surgeon: Champ Mungo, MD;  Location: [facility];  Service: General;  Laterality: N/A;   . LAPAROSCOPY, DIAGNOSTIC N/A 09/01/2018    Procedure: LAPAROSCOPY, DIAGNOSTIC;  Surgeon: Champ Mungo, MD;  Location: [facility];  Service: General;  Laterality: N/A;   . OVARY SURGERY Right >25 yrs   . SPINE SURGERY  11/2013    cervical HNP x 2, Dr. Bufford Buttner       Social History:  Social History     Socioeconomic History   . Marital status: Single   . Number of children: 2   Occupational History   . Occupation: Acupuncturist: [employer]    Tobacco Use   . Smoking status: Never Smoker   . Smokeless tobacco: Never Used   Vaping Use   . Vaping Use: Never used   Substance and Sexual Activity   . Alcohol use: Never   . Drug use: Never   . Sexual activity: Never     Birth control/protection: Post-menopausal   Other Topics Concern   . Dietary supplements / vitamins Yes   . Anesthesia problems No   . Blood thinners No   . Eats large amounts No   . Excessive Sweets No   . Skips meals No   . Eats excessive starches Yes   . Snacks or grazes No   . Emotional eater Yes   . Eats fried food Yes   . Eats fast food Yes   . Diet Center No   . HMR No   . [name] No   . [place] Weight Loss No   . Nutri-System No   . Opti-Fast / Medi-Fast No   . Overeaters Anonymous No   . [facility] Weight Loss Center No   . TOPS No   . Weight Watchers No   . Atkins No   . Binging / Purging No   . Body for Life No   . Cabbage Soup No   . Calorie Counting No   . Fasting No   . [name] No   . Health Spa No   . Herbal Life No   . High Protein No   . Low Carb No   . Low Fat No   . Mayo [facility] Diet No   . [place] Diet No   . Margie Billet Diet No   . [place] Diet No   . Slim Fast No   . [place] No   . Sugar Busters No   .  Vomiting No   . Zone Diet No   . Stationary cycle or treadmill No   . Gym/fitness Classes No   . Home exercise/video No   . Swimming No   . Team sports No   . Weight training No   . Walking or running No   . Hospitalization No   . Hypnosis No    . Physical therapy No   . Psychological therapy No   . Residential program No   . Acutrim No   . Amphetamines No   . Anorex No   . Byetta No   . Dexatrim No   . Didrex No   . Fastin No   . Fen - Phen No   . Ionamin / Adipex No   . Mazanor No   . Meridia No   . Obalan No   . Phendiet No   . Phentrol No   . Phentermine Yes   . Plegine No   . Pondimin No   . Qsymia No   . Prozac No   . Redux No   . Sanorex No   . Tenuate No   . Tepanole No   . Wechless No   . Wellbutrin No   . Xenical (Orlistat, Alli) No   . Other Med No   . No impairment Yes     Comment: Chronic Bilat Knee Pain   . Walks with cane/crutch Yes   . Requires a wheelchair No   . Bedridden No   . Are you currently being treated for depression? No   . Do you snore? Yes   . Are you receiving any medical or psychological services? No   . Do you have or have you been treated for an eating disorder? No   . Do you exercise regularly? No   . Have you or family member ever have trouble with anesthesia? No     Social Determinants of Health     Financial Resource Strain: Low Risk    . Difficulty of Paying Living Expenses: Not hard at all   Food Insecurity: No Food Insecurity   . Worried About Programme researcher, broadcasting/film/video in the Last Year: Never true   . Ran Out of Food in the Last Year: Never true   Transportation Needs: No Transportation Needs   . Lack of Transportation (Medical): No   . Lack of Transportation (Non-Medical): No   Physical Activity: Inactive   . Days of Exercise per Week: 0 days   . Minutes of Exercise per Session: 0 min   Stress: No Stress Concern Present   . Feeling of Stress : Not at all   Social Connections: Moderately Integrated   . Frequency of Communication with Friends and Family: More than three times a week   . Frequency of Social Gatherings with Friends and Family: More than three times a week   . Attends Religious Services: More than 4 times per year   . Active Member of Clubs or Organizations: Yes   . Attends Banker Meetings: More  than 4 times per year   . Marital Status: Never married   Housing Stability: Unknown   . Unable to Pay for Housing in the Last Year: No   . Unstable Housing in the Last Year: No       Family History:  Family History   Problem Relation Age of Onset   . Cancer Mother 18        breast cancer   .  Breast cancer Mother    . Heart disease Father    . Malignant hyperthermia Neg Hx    . Pseudochol deficiency Neg Hx        Outpatient Medication:  Discharge Medication List as of 01/20/2021  3:15 PM      CONTINUE these medications which have NOT CHANGED    Details   aspirin EC 81 MG EC tablet Take 1 tablet (81 mg total) by mouth daily., Starting Tue 01/25/2018, Normal      atenolol (TENORMIN) 50 MG tablet Take 1 tablet (50 mg total) by mouth 2 (two) times daily Take 50 mg by mouth 2 (two) times daily, Starting Mon 09/02/2020, E-Rx      losartan (COZAAR) 25 MG tablet Take 1 tablet (25 mg total) by mouth every evening, Starting Fri 11/15/2020, E-Rx      traMADol (ULTRAM) 50 MG tablet 50 mg, Starting Wed 01/08/2021, Historical Med      Cyanocobalamin (B-12 IJ) Inject as directed every 30 (thirty) days   , Historical Med      diazePAM (VALIUM) 5 MG tablet Take 1 tablet (5 mg total) by mouth every 8 (eight) hours as needed for Anxiety, Starting Thu 08/15/2020, E-Rx      famotidine (PEPCID) 20 MG tablet Take 1 tablet (20 mg total) by mouth daily, Starting Tue 07/16/2020, E-Rx      fluticasone (FLONASE) 50 MCG/ACT nasal spray 2 sprays by Nasal route daily., Starting 01/02/2014, Until Discontinued, Normal      lidocaine (XYLOCAINE) 2 % jelly Apply topically as needed (sore), Starting Fri 11/15/2020, Until Sat 11/15/2021 at 2359, Normal      Multiple Vitamin (MULTIVITAMIN) capsule Take 1 capsule by mouth daily, Historical Med      ondansetron (ZOFRAN-ODT) 4 MG disintegrating tablet Take 1 tablet (4 mg total) by mouth every 6 (six) hours as needed for Nausea, Starting Fri 12/08/2019, E-Rx      pantoprazole (PROTONIX) 40 MG tablet TAKE 1 TABLET(40 MG)  BY MOUTH DAILY, E-Rx      valACYclovir HCL (VALTREX) 500 MG tablet TAKE 1 TABLET(500 MG) BY MOUTH DAILY, Normal      vitamin D, ergocalciferol, (DRISDOL) 50000 UNIT Cap Take 1 capsule (50,000 Units total) by mouth once a week, Starting Wed 11/15/2019, Normal               REVIEW OF SYSTEMS   Review of Systems   Constitutional: Negative for activity change, appetite change, chills, fatigue and fever.   HENT: Negative.    Eyes: Negative.    Respiratory: Negative.    Cardiovascular: Negative.  Negative for chest pain, palpitations and leg swelling.   Gastrointestinal: Positive for abdominal pain.        Cramps   Endocrine: Negative.    Genitourinary: Negative.    Musculoskeletal: Negative.    Skin: Negative.    Allergic/Immunologic: Negative.    Neurological: Negative.    Hematological: Negative.    Psychiatric/Behavioral: Negative.          PHYSICAL EXAM     ED Triage Vitals [01/20/21 0955]   Enc Vitals Group      BP 185/90      Heart Rate 72      Resp Rate 18      Temp 98 F (36.7 C)      Temp Source Oral      SpO2 100 %      Weight 63.5 kg      Height 1.549 m  Head Circumference       Peak Flow       Pain Score       Pain Loc       Pain Edu?       Excl. in GC?      Physical Exam  Vitals and nursing note reviewed.   Constitutional:       General: She is not in acute distress.     Appearance: Normal appearance. She is normal weight. She is not ill-appearing, toxic-appearing or diaphoretic.   HENT:      Head: Normocephalic and atraumatic.      Nose: Nose normal. No congestion or rhinorrhea.      Mouth/Throat:      Mouth: Mucous membranes are moist.      Pharynx: Oropharynx is clear. No oropharyngeal exudate or posterior oropharyngeal erythema.   Eyes:      Conjunctiva/sclera: Conjunctivae normal.      Pupils: Pupils are equal, round, and reactive to light.   Cardiovascular:      Rate and Rhythm: Normal rate and regular rhythm.      Heart sounds: Normal heart sounds.   Pulmonary:      Effort: Pulmonary effort is  normal. No respiratory distress.      Breath sounds: Normal breath sounds. No wheezing or rales.   Chest:      Chest wall: No tenderness.   Abdominal:      General: Bowel sounds are normal. There is no distension.      Palpations: Abdomen is soft.      Tenderness: There is no abdominal tenderness. There is no right CVA tenderness, left CVA tenderness or guarding.   Musculoskeletal:         General: Tenderness present.      Cervical back: Normal range of motion.      Comments: L knee replacement, baseline tenderness, improving ROM with PT x3/week.    Skin:     General: Skin is warm and dry.      Capillary Refill: Capillary refill takes less than 2 seconds.   Neurological:      General: No focal deficit present.      Mental Status: She is alert and oriented to person, place, and time.   Psychiatric:         Mood and Affect: Mood normal.         Behavior: Behavior normal.           MEDICAL DECISION MAKING     70 year old female with PSH of L knee replacement 12/11/2020. Receives PT x3/week. Has been having ongoing difficulty controlling post surgical pain. Percocet has been changed to tramadol which she states is not working well. Will treat with Tylenol and reassess. Unable to take NSAIDS d/t PMH gastric bypass  Today states she called cardiologist to report her BP readings had been elevated SBPs of 160-180. She questions if this could be pain response, was told to go the ED b/c cardiologist is out of office today and she cannot be seen.  On exam she is well appearing, uses walker for ambulation assist. Bps have been 160/70's in ED. ECG and labs are unremarkable. She has no CP, SOB, diaphoresis; low concern for ACS.   Tylenol has not been effective in treating post surgical pain. She is encouraged to continue gentle ROM exercises and PT, ice, tylenol and tramadol as needed. Abdominal "gas-like" pain is most representative of constipation d/t decreased mobility and narcotic pain use.  Abdominal exam is benign. UA is  normal. She is encouraged to increase fiber and fluid intake; miralax as needed.  She is reassured of findings and is safe for discharge. BP management is best done by cardiologist vs episodic management in ED setting. Pt states she understands and will call cardiology tomorrow to arrange appointment for f/u.       DISCUSSION             All labs and vital signs from the current visit have been reviewed and any abnormality that is present is not due to severe sepsis or septic shock.    Vital Signs: Reviewed the patient's vital signs.   Nursing Notes: Reviewed and utilized available nursing notes.  Medical Records Reviewed: Reviewed available past medical records.  Counseling: The emergency provider has spoken with the patient and discussed today's findings, in addition to providing specific details for the plan of care.  Questions are answered and there is agreement with the plan.          CARDIAC STUDIES    The following cardiac studies were independently interpreted by the Emergency Medicine Physician.  For full cardiac study results please see chart.    Monitor Strip  Interpreted by ED Physician  Rate: 68  Rhythm: NSR   ST Changes: none    EKG Interpretation:  Signed and interpreted by ED Provider   Time Interpreted:1230  Comparison: 01/02/2019  Rate: 69  Rhythm: NSR  Axis: normal  Intervals: normal  Blocks: none  ST segments: none  Interpretation: Nonspecific  EKG      RADIOLOGY IMAGING STUDIES      No orders to display         PULSE OXIMETRY    Oxygen Saturation by Pulse Oximetry: 99%  Interventions: none  Interpretation:  Normal      EMERGENCY DEPT. MEDICATIONS      ED Medication Orders (From admission, onward)    Start Ordered     Status Ordering Provider    01/20/21 1314 01/20/21 1313  acetaminophen (TYLENOL) tablet 1,000 mg  Once        Route: Oral  Ordered Dose: 1,000 mg     Last MAR action: Given Hallie Ertl M          LABORATORY RESULTS    Ordered and independently interpreted AVAILABLE laboratory  tests. Please see results section in chart for full details.  Results for orders placed or performed during the hospital encounter of 01/20/21   CBC and differential   Result Value Ref Range    WBC 5.00 3.10 - 9.50 x10 3/uL    Hgb 13.0 11.4 - 14.8 g/dL    Hematocrit 84.6 96.2 - 43.7 %    Platelets 283 142 - 346 x10 3/uL    RBC 4.64 3.90 - 5.10 x10 6/uL    MCV 87.9 78.0 - 96.0 fL    MCH 28.0 25.1 - 33.5 pg    MCHC 31.9 31.5 - 35.8 g/dL    RDW 14 11 - 15 %    MPV 10.4 8.9 - 12.5 fL    Neutrophils 69.0 None %    Lymphocytes Automated 19.2 None %    Monocytes 8.2 None %    Eosinophils Automated 2.6 None %    Basophils Automated 0.8 None %    Immature Granulocytes 0.2 None %    Nucleated RBC 0.0 0.0 - 0.0 /100 WBC    Neutrophils Absolute 3.45 1.10 - 6.33 x10 3/uL  Lymphocytes Absolute Automated 0.96 0.42 - 3.22 x10 3/uL    Monocytes Absolute Automated 0.41 0.21 - 0.85 x10 3/uL    Eosinophils Absolute Automated 0.13 0.00 - 0.44 x10 3/uL    Basophils Absolute Automated 0.04 0.00 - 0.08 x10 3/uL    Immature Granulocytes Absolute 0.01 0.00 - 0.07 x10 3/uL    Absolute NRBC 0.00 0.00 - 0.00 x10 3/uL   Comprehensive metabolic panel   Result Value Ref Range    Glucose 85 70 - 100 mg/dL    BUN 7.0 7.0 - 16.1 mg/dL    Creatinine 0.7 0.6 - 1.0 mg/dL    Sodium 096 045 - 409 mEq/L    Potassium 3.9 3.5 - 5.1 mEq/L    Chloride 108 100 - 111 mEq/L    CO2 27 22 - 29 mEq/L    Calcium 9.0 8.5 - 10.5 mg/dL    Protein, Total 6.6 6.0 - 8.3 g/dL    Albumin 3.9 3.5 - 5.0 g/dL    AST (SGOT) 14 5 - 34 U/L    ALT 13 0 - 55 U/L    Alkaline Phosphatase 89 37 - 106 U/L    Bilirubin, Total 0.5 0.2 - 1.2 mg/dL    Globulin 2.7 2.0 - 3.6 g/dL    Albumin/Globulin Ratio 1.4 0.9 - 2.2    Anion Gap 8.0 5.0 - 15.0   Lipase   Result Value Ref Range    Lipase 6 (L) 8 - 78 U/L   Hepatic function panel (LFT)   Result Value Ref Range    Bilirubin Direct 0.2 0.0 - 0.5 mg/dL    Bilirubin Indirect 0.3 0.2 - 1.0 mg/dL   GFR   Result Value Ref Range    EGFR >60.0     ECG 12 lead   Result Value Ref Range    Ventricular Rate 69 BPM    Atrial Rate 69 BPM    P-R Interval 164 ms    QRS Duration 80 ms    Q-T Interval 394 ms    QTC Calculation (Bezet) 422 ms    P Axis 50 degrees    R Axis -12 degrees    T Axis 11 degrees       CRITICAL CARE/PROCEDURES    Procedures    DIAGNOSIS      Diagnosis:  Final diagnoses:   Pain following surgery or procedure   Constipation, unspecified constipation type   Hypertension, unspecified type       Disposition:  ED Disposition     ED Disposition   Discharge    Condition   --    Date/Time   Mon Jan 20, 2021  3:15 PM    Comment   Amber [name] discharge to home/self care.    Condition at disposition: Stable               Prescriptions:  Discharge Medication List as of 01/20/2021  3:15 PM      CONTINUE these medications which have NOT CHANGED    Details   aspirin EC 81 MG EC tablet Take 1 tablet (81 mg total) by mouth daily., Starting Tue 01/25/2018, Normal      atenolol (TENORMIN) 50 MG tablet Take 1 tablet (50 mg total) by mouth 2 (two) times daily Take 50 mg by mouth 2 (two) times daily, Starting Mon 09/02/2020, E-Rx      losartan (COZAAR) 25 MG tablet Take 1 tablet (25 mg total) by mouth every evening, Starting Fri 11/15/2020,  E-Rx      traMADol (ULTRAM) 50 MG tablet 50 mg, Starting Wed 01/08/2021, Historical Med      Cyanocobalamin (B-12 IJ) Inject as directed every 30 (thirty) days   , Historical Med      diazePAM (VALIUM) 5 MG tablet Take 1 tablet (5 mg total) by mouth every 8 (eight) hours as needed for Anxiety, Starting Thu 08/15/2020, E-Rx      famotidine (PEPCID) 20 MG tablet Take 1 tablet (20 mg total) by mouth daily, Starting Tue 07/16/2020, E-Rx      fluticasone (FLONASE) 50 MCG/ACT nasal spray 2 sprays by Nasal route daily., Starting 01/02/2014, Until Discontinued, Normal      lidocaine (XYLOCAINE) 2 % jelly Apply topically as needed (sore), Starting Fri 11/15/2020, Until Sat 11/15/2021 at 2359, Normal      Multiple Vitamin (MULTIVITAMIN) capsule  Take 1 capsule by mouth daily, Historical Med      ondansetron (ZOFRAN-ODT) 4 MG disintegrating tablet Take 1 tablet (4 mg total) by mouth every 6 (six) hours as needed for Nausea, Starting Fri 12/08/2019, E-Rx      pantoprazole (PROTONIX) 40 MG tablet TAKE 1 TABLET(40 MG) BY MOUTH DAILY, E-Rx      valACYclovir HCL (VALTREX) 500 MG tablet TAKE 1 TABLET(500 MG) BY MOUTH DAILY, Normal      vitamin D, ergocalciferol, (DRISDOL) 50000 UNIT Cap Take 1 capsule (50,000 Units total) by mouth once a week, Starting Wed 11/15/2019, Normal             This note was generated by the Epic EMR system/ Dragon speech recognition and may contain inherent errors or omissions not intended by the user. Grammatical errors, random word insertions, deletions and pronoun errors  are occasional consequences of this technology due to software limitations. Not all errors are caught or corrected. If there are questions or concerns about the content of this note or information contained within the body of this dictation they should be addressed directly with the author for clarification.       Edythe Lynn, FNP  01/20/21 1518       Edythe Lynn, FNP  01/21/21 1515

## 2021-01-20 NOTE — ED Triage Notes (Signed)
PT arrives with complaints of elevated BP. PT states that she has noted a steady increase in her BP on 3/18, and it has continued to increase. Denies chest pain, but does endorse mild abdominal pain.     PT reports LEFT knee replacement approx 1 month ago.

## 2021-01-20 NOTE — EDIE (Signed)
COLLECTIVE?NOTIFICATION?01/20/2021 09:49?Amber [name], Amber [name]?MRN: [id number]    Criteria Met      5 ED Visits in 12 Months    Security and Safety  No recent Security Events currently on file    ED Care Guidelines  There are currently no ED Care Guidelines for this patient. Please check your facility's medical records system.            E.D. Visit Count (12 mo.)  Facility Visits   [facility] 2   [facility] 1   [place] [facility]: [facility] 2   Total 5   Note: Visits indicate total known visits.     Recent Emergency Department Visit Summary  Date Facility [facility] Type Diagnoses or Chief Complaint   Jan 20, 2021 [facility] Emergency Room: [facility] [place] [facility]      hign BP      Oct 06, 2020  Emergency Room: [facility] [place] [place] [facility]      SOB, Cough, Sore Throat      Cough      Shortness of Breath      Unspecified asthma with (acute) exacerbation      Personal history of other diseases of the respiratory system      Shortness of breath      Cough, unspecified      Personal history of other diseases of the nervous system and sense organs      Dorsalgia, unspecified      Personal history of other diseases of the circulatory system      Aug 03, 2020 [facility] [place] [name] [facility]      HIGH BLOOD PRESSURE      Chest pain, unspecified      Mar 11, 2020 [facility] [place] [name] [facility]      RS/CHEST PAIN      Essential (primary) hypertension      1. Chest pain, unspecified      2. Epigastric pain      4. Nausea      5. Diarrhea, unspecified      6. Shortness of breath      7. Sleep apnea, unspecified      8. Gastro-esophageal reflux disease without esophagitis      9. Long term (current) use of aspirin      Mar 08, 2020 [facility] [place] [facility]  Emergency      LF CALF/THIGH PAIN. SENT BY DR Janice Norrie.      Pain in left leg      1. Pain in left lower leg      2. Pain in left knee      2. Bilateral  primary osteoarthritis of knee      3. Other specified postprocedural states      3. Personal history of transient ischemic attack (TIA), and cerebral infarction without residual deficits      4. Essential (primary) hypertension      5. Sleep apnea, unspecified      6. Gastro-esophageal reflux disease without esophagitis          Recent Inpatient Visit Summary  No recorded inpatient visits.     Care Team  There is not a care team on record at this time.   Collective Portal  This patient has registered at the [facility] Emergency Room: [facility] Emergency Department   For more information visit: [email or web address]  PLEASE NOTE:     1.   Any care recommendations and other clinical information are provided as guidelines or for historical purposes only, and providers should exercise their own clinical judgment when providing care.    2.   You may only use this information for purposes of treatment, payment or health care operations activities, and subject to the limitations of applicable [employer] Policies.    3.   You should consult directly with the organization that provided a care guideline or other clinical history with any questions about additional information or accuracy or completeness of information provided.    ? 2022 [employer] - www.collectivemedical.com

## 2021-01-21 ENCOUNTER — Telehealth (INDEPENDENT_AMBULATORY_CARE_PROVIDER_SITE_OTHER): Payer: Self-pay | Admitting: Cardiology

## 2021-01-21 ENCOUNTER — Other Ambulatory Visit (INDEPENDENT_AMBULATORY_CARE_PROVIDER_SITE_OTHER): Payer: Self-pay | Admitting: Internal Medicine

## 2021-01-21 DIAGNOSIS — K219 Gastro-esophageal reflux disease without esophagitis: Secondary | ICD-10-CM

## 2021-01-21 LAB — ECG 12-LEAD
Atrial Rate: 69 {beats}/min
P Axis: 50 degrees
P-R Interval: 164 ms
Q-T Interval: 394 ms
QRS Duration: 80 ms
QTC Calculation (Bezet): 422 ms
R Axis: -12 degrees
T Axis: 11 degrees
Ventricular Rate: 69 {beats}/min

## 2021-01-21 NOTE — Telephone Encounter (Signed)
Per Dr. Katrinka Blazing -  increase losartan 50 mg daily    I called pt and left a voicemail with Dr. Michaelle Copas instruction, requested for a call back to confirm.

## 2021-01-21 NOTE — Telephone Encounter (Signed)
Please call patient.  She is concerned about her BP.  Per pt, she needs a refill on her medication.  Patient is scheduled for a follow up on 03/27/21.          [facility]  [facility]

## 2021-01-21 NOTE — Telephone Encounter (Signed)
This pt was sent to ED yesterday. Will send message to Dr. Katrinka Blazing for further instructions.

## 2021-01-21 NOTE — Telephone Encounter (Signed)
Pt called back and confirmed she received instructions regarding Losartan.

## 2021-01-22 ENCOUNTER — Ambulatory Visit (INDEPENDENT_AMBULATORY_CARE_PROVIDER_SITE_OTHER): Payer: Medicare Other | Admitting: Internal Medicine

## 2021-01-22 ENCOUNTER — Encounter (INDEPENDENT_AMBULATORY_CARE_PROVIDER_SITE_OTHER): Payer: Self-pay | Admitting: Internal Medicine

## 2021-01-22 VITALS — BP 144/98 | HR 68 | Temp 98.1°F | Resp 14 | Wt 141.0 lb

## 2021-01-22 DIAGNOSIS — I10 Essential (primary) hypertension: Secondary | ICD-10-CM

## 2021-01-22 DIAGNOSIS — R0989 Other specified symptoms and signs involving the circulatory and respiratory systems: Secondary | ICD-10-CM

## 2021-01-22 DIAGNOSIS — K219 Gastro-esophageal reflux disease without esophagitis: Secondary | ICD-10-CM

## 2021-01-22 MED ORDER — LOSARTAN POTASSIUM 50 MG PO TABS
50.0000 mg | ORAL_TABLET | Freq: Every day | ORAL | 1 refills | Status: DC
Start: 2021-01-22 — End: 2021-02-11

## 2021-01-22 MED ORDER — PANTOPRAZOLE SODIUM 40 MG PO TBEC
40.0000 mg | DELAYED_RELEASE_TABLET | Freq: Every day | ORAL | 1 refills | Status: DC
Start: 2021-01-22 — End: 2022-01-14

## 2021-01-22 NOTE — Progress Notes (Signed)
Have you seen any specialists/other providers since your last visit with [place]?    Yes, orthopedic surgeon     Arm preference verified?   Yes    The patient is due for pneumonia vaccine and statin use, MAWV, covid-19 booster ;

## 2021-01-22 NOTE — Progress Notes (Signed)
Subjective:       Patient ID: ARIAM MOL is a 70 y.o. female.    HPI  Pt is here for follow up     HTN, went to ED 2d ago for elevated BP, labs, EKG, ok    Card increased losartan to 50mg  daily yesterday    S/p left knee replacement, doing PT    GERD, stable with med, no dysphagia    The following portions of the patient's history were reviewed and updated as appropriate: allergies, current medications, past family history, past medical history, past social history, past surgical history and problem list.    Review of Systems   Constitutional: Negative for appetite change, chills and unexpected weight change.   Respiratory: Negative for shortness of breath.    Cardiovascular: Negative for chest pain and palpitations.   Gastrointestinal: Negative for abdominal pain, nausea and vomiting.   Genitourinary: Negative for dysuria.   Musculoskeletal: Positive for joint swelling. Negative for myalgias.   Neurological: Negative for syncope, light-headedness and headaches.     BP (!) 144/98 (BP Site: Right arm, Patient Position: Sitting, Cuff Size: Medium)   Pulse 68   Temp 98.1 F (36.7 C) (Temporal)   Resp 14   Wt 64 kg (141 lb)   LMP  (LMP Unknown)   BMI 26.64 kg/m        Objective:    Physical Exam  Constitutional:       General: She is not in acute distress.     Appearance: She is well-developed.   HENT:      Mouth/Throat:      Pharynx: No oropharyngeal exudate.   Eyes:      General: No scleral icterus.     Conjunctiva/sclera: Conjunctivae normal.   Neck:      Thyroid: No thyromegaly.      Vascular: No carotid bruit or JVD.   Cardiovascular:      Rate and Rhythm: Normal rate and regular rhythm.      Heart sounds: Normal heart sounds. No murmur heard.    No friction rub. No gallop.   Pulmonary:      Effort: Pulmonary effort is normal. No respiratory distress.      Breath sounds: Normal breath sounds. No rales.   Abdominal:      General: Bowel sounds are normal. There is no distension.      Palpations: Abdomen  is soft.      Tenderness: There is no abdominal tenderness. There is no guarding or rebound.   Musculoskeletal:         General: Tenderness present.      Cervical back: Neck supple.      Right lower leg: No edema.      Left lower leg: No edema.      Comments: +tenderness left knee, +swelling, warmth, reduced ROM   Neurological:      Mental Status: She is alert.             Assessment:       1. Essential hypertension     2. Gastroesophageal reflux disease, unspecified whether esophagitis present  pantoprazole (PROTONIX) 40 MG tablet   3. White coat syndrome with diagnosis of hypertension  losartan (COZAAR) 50 MG tablet   4. Labile hypertension            Plan:      Procedures  No orders of the defined types were placed in this encounter.      rx refills. Continue  meds  Monitor home BP  Keep f/u with card  Continue PT for knee  F/u 4 wks.

## 2021-01-23 ENCOUNTER — Telehealth (INDEPENDENT_AMBULATORY_CARE_PROVIDER_SITE_OTHER): Payer: Self-pay | Admitting: Internal Medicine

## 2021-01-23 NOTE — Telephone Encounter (Signed)
Patient following up on prior authorization for     pantoprazole (PROTONIX) 40 MG tablet      Please advise

## 2021-01-24 NOTE — Telephone Encounter (Signed)
Contacted the pharmacy to verify if PA is needed for protonix. Initially told by pharmacy a PA was needed, informed pharmacy no paperwork was received for RX. During the process of sending Fax , pharmacy stated a PA WAS NOT NEEDED. IT is REFILL TOO SOON.       Contacted patient,left message on VM informing patient the PA for Pantoprazole was not needed. As per pharmacy, the refill was too soon.

## 2021-01-27 ENCOUNTER — Encounter (INDEPENDENT_AMBULATORY_CARE_PROVIDER_SITE_OTHER): Payer: Self-pay | Admitting: Internal Medicine

## 2021-01-29 ENCOUNTER — Telehealth (INDEPENDENT_AMBULATORY_CARE_PROVIDER_SITE_OTHER): Payer: Self-pay

## 2021-01-29 NOTE — Telephone Encounter (Signed)
Pa initiated for Pantoprazole Sodium 40 MG DR tablets.  (Received paper for PA per patient request please do PA. Do not want medication in 30 day increment)     Key: [id number] - PA Case ID: [id number] - Rx #: [id number]      Approved  today  Your PA request has been approved    Pharmacy called and informed of PA status/update. Informed by pharmacy they will call patient regarding prescription.

## 2021-01-31 ENCOUNTER — Other Ambulatory Visit: Payer: Self-pay

## 2021-01-31 NOTE — Progress Notes (Addendum)
01/31/21 1512   Pt Outreach/Care Plan Metrics   Payor Grouping for Outreach MSSP   Outreach Status for Metrics listing Left Voicemail   (No patient answer)     Nurse Navigator placed a call to patient to follow up and discuss care management progress.  Patient did not answer. Left voicemail message with contact information for questions or concerns.     Arva Chafe., RN, BSN, CDCES, ACM-RN   Patient Care Nurse Navigator  Ambulatory Care Management   T [phone]

## 2021-02-06 ENCOUNTER — Other Ambulatory Visit: Payer: Self-pay

## 2021-02-06 NOTE — Progress Notes (Addendum)
02/06/21 1331   Pt Outreach/Care Plan Metrics   Payor Grouping for Outreach MSSP   Outreach Status for Metrics listing Left Voicemail   (No patient answer)     Nurse Navigator placed a call to patient to follow up and discuss care management progress.  Patient did not answer. Left voicemail message with contact information for questions or concerns.     Arva Chafe., RN, BSN, CDCES, ACM-RN   Patient Care Nurse Navigator  Ambulatory Care Management   T [phone]

## 2021-02-07 ENCOUNTER — Encounter (INDEPENDENT_AMBULATORY_CARE_PROVIDER_SITE_OTHER): Payer: Self-pay

## 2021-02-08 ENCOUNTER — Encounter (INDEPENDENT_AMBULATORY_CARE_PROVIDER_SITE_OTHER): Payer: Self-pay

## 2021-02-11 ENCOUNTER — Ambulatory Visit (INDEPENDENT_AMBULATORY_CARE_PROVIDER_SITE_OTHER): Payer: Medicare Other | Admitting: Cardiology

## 2021-02-11 ENCOUNTER — Encounter (INDEPENDENT_AMBULATORY_CARE_PROVIDER_SITE_OTHER): Payer: Self-pay | Admitting: Cardiology

## 2021-02-11 VITALS — BP 168/101 | HR 67 | Temp 97.3°F | Wt 140.0 lb

## 2021-02-11 DIAGNOSIS — I10 Essential (primary) hypertension: Secondary | ICD-10-CM

## 2021-02-11 DIAGNOSIS — E7800 Pure hypercholesterolemia, unspecified: Secondary | ICD-10-CM

## 2021-02-11 MED ORDER — LOSARTAN POTASSIUM 50 MG PO TABS
25.0000 mg | ORAL_TABLET | Freq: Two times a day (BID) | ORAL | 1 refills | Status: DC
Start: 2021-02-11 — End: 2021-02-11

## 2021-02-11 MED ORDER — LOSARTAN POTASSIUM 25 MG PO TABS
25.0000 mg | ORAL_TABLET | Freq: Two times a day (BID) | ORAL | 3 refills | Status: DC
Start: 2021-02-11 — End: 2021-06-12

## 2021-02-11 NOTE — Progress Notes (Signed)
Amber [name] [phone].o. with a history of HTN presents for cardiac follow up of hypertension.    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     Hypertension:Multiple medication intolerances since bariatric surgery.  Currently she is on losartan 50 mg daily and atenolol 50 mg bid.  She was intolerant to the recently prescribed hydrochlorothiazide.  Blood pressure control has improved. She also complains of significant nausea when taking her losartan.    Palpitations:She has had intermittent palpitations. Palpitations have decreased with the addition of beta-blocker to her regimen. No significant events on a recent 24-hour Holter monitor. Last year she did wear a 30-day event monitor that noted occasional PVCs.     Cerebrovascular disease: History of a TIA. She has had no recurrence. This TIA occurred when she was not on aspirin. Currently compliant with aspirin.    Cardiac work up has included    Echocardiogram (04/04/2020): Normal LV/RV size and systolic function. Ejection fraction 60%. Mild left ventricular hypertrophy. No significant valvular heart disease. No pericardial effusion.      - stress test (11/06/17 - normal myocardial perfusion)  - cardiac cath ( 01/08/14 - normal )  - echocardiogram ( EF 55%, mild TR)        Current Outpatient Medications   Medication Sig Dispense Refill   . aspirin EC 81 MG EC tablet Take 1 tablet (81 mg total) by mouth daily. 30 tablet 1   . atenolol (TENORMIN) 50 MG tablet Take 1 tablet (50 mg total) by mouth 2 (two) times daily Take 50 mg by mouth 2 (two) times daily 180 tablet 1   . Cyanocobalamin (B-12 IJ) Inject as directed every 30 (thirty) days        . diazePAM (VALIUM) 5 MG tablet Take 1 tablet (5 mg total) by mouth every 8 (eight) hours as needed for Anxiety 30 tablet 2   . famotidine (PEPCID) 20 MG tablet Take 1 tablet (20 mg total) by mouth daily 90 tablet 1   .  fluticasone (FLONASE) 50 MCG/ACT nasal spray 2 sprays by Nasal route daily. (Patient taking differently: 2 sprays by Nasal route as needed) 16 g 2   . lidocaine (XYLOCAINE) 2 % jelly Apply topically as needed (sore) 30 mL 1   . Multiple Vitamin (MULTIVITAMIN) capsule Take 1 capsule by mouth daily     . ondansetron (ZOFRAN-ODT) 4 MG disintegrating tablet Take 1 tablet (4 mg total) by mouth every 6 (six) hours as needed for Nausea 8 tablet 0   . pantoprazole (PROTONIX) 40 MG tablet Take 1 tablet (40 mg total) by mouth daily 90 tablet 1   . traMADol (ULTRAM) 50 MG tablet 50 mg     . valACYclovir HCL (VALTREX) 500 MG tablet TAKE 1 TABLET(500 MG) BY MOUTH DAILY 90 tablet 1   . vitamin D, ergocalciferol, (DRISDOL) 50000 UNIT Cap Take 1 capsule (50,000 Units total) by mouth once a week 12 capsule 1   . losartan (COZAAR) 25 MG tablet Take 1 tablet (25 mg total) by mouth 2 (two) times daily Dose increased to 50mg  daily 3/22 180 tablet 3     No current facility-administered medications for this visit.            PE:    Vitals:    02/11/21 1421   BP: (!) 168/101   Pulse: 67   Temp: 97.3 F (36.3 C)     Body mass index is 26.45 kg/m.  Physical Examination: General appearance - alert, well appearing, and in no distress  Mental status - alert, oriented to person, place, and time  Chest - clear to auscultation, no wheezes, rales or rhonchi, symmetric air entry  Heart - normal rate and regular rhythm, S1 and S2 normal  Abdomen - soft, nontender, nondistended, no masses or organomegaly  Extremities - no pedal edema noted        Labs:  Lipid Panel   Cholesterol   Date/Time Value Ref Range Status   10/04/2019 09:56 AM 196 0 - 199 mg/dL Final     Triglycerides   Date/Time Value Ref Range Status   10/04/2019 09:56 AM 40 34 - 149 mg/dL Final     HDL   Date/Time Value Ref Range Status   10/04/2019 09:56 AM 73 40 - 9,999 mg/dL Final     Comment:     An HDL cholesterol <40 mg/dL is low and constitutes a  coronary heart disease risk  factor, and HDL-C>59 mg/dL is  a negative risk factor for CHD.  Ref: American Heart Association; Circulation 2004         CMP:   Sodium   Date/Time Value Ref Range Status   01/20/2021 01:25 PM 143 136 - 145 mEq/L Final     Potassium   Date/Time Value Ref Range Status   01/20/2021 01:25 PM 3.9 3.5 - 5.1 mEq/L Final     Chloride   Date/Time Value Ref Range Status   01/20/2021 01:25 PM 108 100 - 111 mEq/L Final   08/16/2018 07:34 AM 107 98 - 110 mmol/L Final     CO2   Date/Time Value Ref Range Status   01/20/2021 01:25 PM 27 22 - 29 mEq/L Final     Glucose   Date/Time Value Ref Range Status   01/20/2021 01:25 PM 85 70 - 100 mg/dL Final     Comment:     ADA guidelines for diabetes mellitus:  Fasting:  Equal to or greater than 126 mg/dL  Random:   Equal to or greater than 200 mg/dL       BUN   Date/Time Value Ref Range Status   01/20/2021 01:25 PM 7.0 7.0 - 19.0 mg/dL Final     Protein, Total   Date/Time Value Ref Range Status   01/20/2021 01:25 PM 6.6 6.0 - 8.3 g/dL Final   16/08/9603 54:09 AM 6.5 6.1 - 8.1 g/dL Final     Alkaline Phosphatase   Date/Time Value Ref Range Status   01/20/2021 01:25 PM 89 37 - 106 U/L Final     AST (SGOT)   Date/Time Value Ref Range Status   01/20/2021 01:25 PM 14 5 - 34 U/L Final     ALT   Date/Time Value Ref Range Status   01/20/2021 01:25 PM 13 0 - 55 U/L Final     Anion Gap   Date/Time Value Ref Range Status   01/20/2021 01:25 PM 8.0 5.0 - 15.0 Final     Comment:     Calculated AGAP = Na - (CL + CO2)  Interpret with caution; calculated AGAP may not reflect patient's  true clinical status.         CBC:   WBC   Date/Time Value Ref Range Status   01/20/2021 01:25 PM 5.00 3.10 - 9.50 x10 3/uL Final   06/30/2009 04:03 PM 9.12 3.50 - 10.80 /CUMM Final     RBC   Date/Time Value Ref Range Status   01/20/2021 01:25 PM  4.64 3.90 - 5.10 x10 6/uL Final     Hemoglobin   Date/Time Value Ref Range Status   08/16/2018 07:34 AM 12.3 11.7 - 15.5 g/dL Final     Hgb   Date/Time Value Ref Range Status    01/20/2021 01:25 PM 13.0 11.4 - 14.8 g/dL Final     Hematocrit   Date/Time Value Ref Range Status   01/20/2021 01:25 PM 40.8 34.7 - 43.7 % Final     MCV   Date/Time Value Ref Range Status   01/20/2021 01:25 PM 87.9 78.0 - 96.0 fL Final     MCHC   Date/Time Value Ref Range Status   01/20/2021 01:25 PM 31.9 31.5 - 35.8 g/dL Final     RDW   Date/Time Value Ref Range Status   01/20/2021 01:25 PM 14 11 - 15 % Final     Platelets   Date/Time Value Ref Range Status   01/20/2021 01:25 PM 283 142 - 346 x10 3/uL Final   08/16/2018 07:34 AM 252 140 - 400 Thousand/uL Final           Impression / plan    Hypertension: Intolerant to many medications since gastric bypass surgery.  Continue atenolol 50 mg twice a day.  She notes significant nausea and lethargy with losartan 50 mg daily.  Will change losartan to 25 mg twice a day.    Follow-up with cardiology in 3 months      Amber Droz Dan Humphreys, MD  02/11/2021

## 2021-02-20 ENCOUNTER — Other Ambulatory Visit: Payer: Self-pay

## 2021-02-20 NOTE — Progress Notes (Signed)
02/20/21 1607   Pt Outreach/Care Plan Metrics   Payor Grouping for Outreach MSSP   Outreach Status for Metrics listing Left Voicemail 3;Unable to Reach  (No patient answer)     Nurse Navigator placed a call to patient to follow up and discuss care management progress. Patient has not answered after multiple attempts. Unable to reach.  Left voicemail message with contact information for any questions or concerns.     Arva Chafe., RN, BSN, CDCES, ACM-RN   Patient Care Nurse Navigator  Ambulatory Care Management   T [phone]

## 2021-02-21 ENCOUNTER — Ambulatory Visit (INDEPENDENT_AMBULATORY_CARE_PROVIDER_SITE_OTHER): Payer: Medicare Other | Admitting: Internal Medicine

## 2021-02-21 ENCOUNTER — Encounter (INDEPENDENT_AMBULATORY_CARE_PROVIDER_SITE_OTHER): Payer: Self-pay | Admitting: Internal Medicine

## 2021-03-09 ENCOUNTER — Encounter (INDEPENDENT_AMBULATORY_CARE_PROVIDER_SITE_OTHER): Payer: Self-pay

## 2021-03-10 ENCOUNTER — Encounter (INDEPENDENT_AMBULATORY_CARE_PROVIDER_SITE_OTHER): Payer: Self-pay

## 2021-03-10 ENCOUNTER — Other Ambulatory Visit (INDEPENDENT_AMBULATORY_CARE_PROVIDER_SITE_OTHER): Payer: Self-pay | Admitting: Internal Medicine

## 2021-03-24 ENCOUNTER — Encounter (INDEPENDENT_AMBULATORY_CARE_PROVIDER_SITE_OTHER): Payer: Self-pay | Admitting: Internal Medicine

## 2021-03-24 ENCOUNTER — Encounter (INDEPENDENT_AMBULATORY_CARE_PROVIDER_SITE_OTHER): Payer: Medicare Other | Admitting: Internal Medicine

## 2021-03-25 ENCOUNTER — Ambulatory Visit (INDEPENDENT_AMBULATORY_CARE_PROVIDER_SITE_OTHER): Payer: Medicare Other | Admitting: Cardiology

## 2021-03-27 ENCOUNTER — Ambulatory Visit (INDEPENDENT_AMBULATORY_CARE_PROVIDER_SITE_OTHER): Payer: Medicare Other | Admitting: Cardiology

## 2021-04-09 ENCOUNTER — Encounter (INDEPENDENT_AMBULATORY_CARE_PROVIDER_SITE_OTHER): Payer: Self-pay

## 2021-04-10 ENCOUNTER — Encounter (INDEPENDENT_AMBULATORY_CARE_PROVIDER_SITE_OTHER): Payer: Self-pay

## 2021-04-28 ENCOUNTER — Telehealth (INDEPENDENT_AMBULATORY_CARE_PROVIDER_SITE_OTHER): Payer: Medicare Other | Admitting: Internal Medicine

## 2021-04-28 ENCOUNTER — Encounter (INDEPENDENT_AMBULATORY_CARE_PROVIDER_SITE_OTHER): Payer: Self-pay | Admitting: Internal Medicine

## 2021-04-28 VITALS — Temp 97.5°F | Wt 133.0 lb

## 2021-04-28 DIAGNOSIS — R110 Nausea: Secondary | ICD-10-CM

## 2021-04-28 DIAGNOSIS — K219 Gastro-esophageal reflux disease without esophagitis: Secondary | ICD-10-CM

## 2021-04-28 DIAGNOSIS — U071 COVID-19: Secondary | ICD-10-CM

## 2021-04-28 MED ORDER — ONDANSETRON 4 MG PO TBDP
4.0000 mg | ORAL_TABLET | Freq: Three times a day (TID) | ORAL | 2 refills | Status: DC | PRN
Start: 2021-04-28 — End: 2024-08-16

## 2021-04-28 MED ORDER — BENZONATATE 100 MG PO CAPS
100.0000 mg | ORAL_CAPSULE | Freq: Three times a day (TID) | ORAL | 1 refills | Status: DC | PRN
Start: 2021-04-28 — End: 2022-03-12

## 2021-04-28 NOTE — Progress Notes (Signed)
Have you seen any specialists/other providers since your last visit with [place]?    Yes    Arm preference verified?   Yes    The patient is due for pneumonia vaccine and statin use, tetanus ten year, MAWV, covid-19 vaccine booster 3

## 2021-04-28 NOTE — Progress Notes (Signed)
Subjective:       Patient ID: Amber [name] is a 70 y.o. female.    HPI  Patient consents to having a telemedicine video visit to reduce risk of COVID-19 exposure.   This visit is carried out remotely with patient at home, and I am in the office.     Pt c/o  Tested positive covid 04/25/21  Sx started 04/24/21  Cough, low grade, slightly sob,  Wheezing, better,   Diarrhea x once, +nausea, no vomiting  Some abd pain,  Mild taste lost.   +GERD, taking protonix.   Otc tylenol        The following portions of the patient's history were reviewed and updated as appropriate: allergies, current medications, past family history, past medical history, past social history, past surgical history, and problem list.    Review of Systems   Constitutional:  Negative for appetite change and chills.   Respiratory:  Positive for cough. Negative for wheezing.    Cardiovascular:  Negative for chest pain and palpitations.   Gastrointestinal:  Positive for abdominal pain and nausea. Negative for blood in stool and vomiting.   Genitourinary:  Negative for dysuria.   Neurological:  Negative for headaches.         Objective:    Physical Exam  Constitutional:       General: She is not in acute distress.     Appearance: She is not ill-appearing.   Pulmonary:      Effort: Pulmonary effort is normal. No respiratory distress.   Neurological:      Mental Status: She is alert.           Assessment:       1. COVID-19 virus infection  benzonatate (TESSALON) 100 MG capsule    ondansetron (ZOFRAN-ODT) 4 MG disintegrating tablet      2. Gastroesophageal reflux disease, unspecified whether esophagitis present        3. Nausea  ondansetron (ZOFRAN-ODT) 4 MG disintegrating tablet             Plan:      Procedures  No orders of the defined types were placed in this encounter.      Oral hydration, rest,   Rx tessalon perles, zofran  Resume pepcid,   D/w pt Paxlovid, pt declined.   Continue albuterol inh prn, but if sx worse, to go to ED for treatment.   Pt  to keep quarantine as per CDC guidelines.   F/u if sx persistent/worse.

## 2021-04-28 NOTE — Telephone Encounter (Addendum)
PCC-Recall MRI in 06/2022 per letter on 06/12/2020.

## 2021-04-29 NOTE — Telephone Encounter (Signed)
Recall created

## 2021-05-09 ENCOUNTER — Encounter (INDEPENDENT_AMBULATORY_CARE_PROVIDER_SITE_OTHER): Payer: Self-pay

## 2021-05-10 ENCOUNTER — Encounter (INDEPENDENT_AMBULATORY_CARE_PROVIDER_SITE_OTHER): Payer: Self-pay

## 2021-06-04 ENCOUNTER — Encounter (INDEPENDENT_AMBULATORY_CARE_PROVIDER_SITE_OTHER): Payer: Self-pay | Admitting: Internal Medicine

## 2021-06-05 ENCOUNTER — Encounter (INDEPENDENT_AMBULATORY_CARE_PROVIDER_SITE_OTHER): Payer: Self-pay | Admitting: Internal Medicine

## 2021-06-09 ENCOUNTER — Encounter (INDEPENDENT_AMBULATORY_CARE_PROVIDER_SITE_OTHER): Payer: Self-pay

## 2021-06-09 ENCOUNTER — Encounter (INDEPENDENT_AMBULATORY_CARE_PROVIDER_SITE_OTHER): Payer: Self-pay | Admitting: Internal Medicine

## 2021-06-10 ENCOUNTER — Encounter (INDEPENDENT_AMBULATORY_CARE_PROVIDER_SITE_OTHER): Payer: Self-pay

## 2021-06-12 ENCOUNTER — Encounter (INDEPENDENT_AMBULATORY_CARE_PROVIDER_SITE_OTHER): Payer: Self-pay | Admitting: Internal Medicine

## 2021-06-12 ENCOUNTER — Ambulatory Visit (INDEPENDENT_AMBULATORY_CARE_PROVIDER_SITE_OTHER): Payer: Medicare Other | Admitting: Internal Medicine

## 2021-06-12 VITALS — BP 160/82 | HR 59 | Temp 97.9°F | Resp 16 | Wt 132.0 lb

## 2021-06-12 DIAGNOSIS — M25562 Pain in left knee: Secondary | ICD-10-CM

## 2021-06-12 DIAGNOSIS — R0989 Other specified symptoms and signs involving the circulatory and respiratory systems: Secondary | ICD-10-CM

## 2021-06-12 DIAGNOSIS — G8929 Other chronic pain: Secondary | ICD-10-CM

## 2021-06-12 DIAGNOSIS — I10 Essential (primary) hypertension: Secondary | ICD-10-CM

## 2021-06-12 DIAGNOSIS — Z9889 Other specified postprocedural states: Secondary | ICD-10-CM

## 2021-06-12 MED ORDER — LOSARTAN POTASSIUM 100 MG PO TABS
100.0000 mg | ORAL_TABLET | Freq: Every day | ORAL | 1 refills | Status: DC
Start: 2021-06-12 — End: 2021-09-18

## 2021-06-12 MED ORDER — OXYCODONE-ACETAMINOPHEN 5-325 MG PO TABS
ORAL_TABLET | ORAL | 0 refills | Status: DC
Start: 2021-06-12 — End: 2021-08-29

## 2021-06-12 NOTE — Progress Notes (Signed)
Subjective:       Patient ID: Amber [name] is a 70 y.o. female.    HPI  Pt is here for follow up     Recent adm at [facility] hosp x 4d , fluctuating BP  Losartan increased to 100mg  daily   Home BP 130-160's/70-80's,   HCTZ added at hosp, but pt is unable to take it due to h/a, n/v, and dizziness  Had nuclear stress test neg.     +pain left knee , s/p replacement 2/22,   Reported persistent pain, scarring.   Sees ortho, Dr. Janice Norrie, scheduled for arthroscopy later this month,   Taking tramadol   Once daily, but not better          The following portions of the patient's history were reviewed and updated as appropriate: allergies, current medications, past family history, past medical history, past social history, past surgical history, and problem list.    Review of Systems   Constitutional:  Negative for appetite change, chills and unexpected weight change.   Respiratory:  Negative for shortness of breath.    Cardiovascular:  Negative for chest pain and palpitations.   Gastrointestinal:  Negative for abdominal pain, nausea and vomiting.   Genitourinary:  Negative for dysuria.   Musculoskeletal:  Positive for arthralgias and joint swelling. Negative for myalgias.   Neurological:  Negative for syncope, light-headedness and headaches.     BP 160/82 (BP Site: Right arm, Patient Position: Sitting, Cuff Size: Medium)   Pulse (!) 59   Temp 97.9 F (36.6 C) (Temporal)   Resp 16   Wt 59.9 kg (132 lb)   LMP  (LMP Unknown)   BMI 24.94 kg/m        Objective:    Physical Exam  Vitals reviewed.   Constitutional:       General: She is not in acute distress.     Appearance: She is well-developed.   Eyes:      General: No scleral icterus.     Conjunctiva/sclera: Conjunctivae normal.   Neck:      Thyroid: No thyromegaly.      Vascular: No carotid bruit or JVD.   Cardiovascular:      Rate and Rhythm: Normal rate and regular rhythm.      Heart sounds: Normal heart sounds. No murmur heard.    No friction rub. No gallop.    Pulmonary:      Effort: Pulmonary effort is normal. No respiratory distress.      Breath sounds: Normal breath sounds. No rales.   Abdominal:      General: Bowel sounds are normal. There is no distension.      Palpations: Abdomen is soft.      Tenderness: There is no abdominal tenderness. There is no guarding or rebound.   Musculoskeletal:         General: Swelling and tenderness present.      Cervical back: Neck supple.      Right lower leg: No edema.      Left lower leg: No edema.      Comments: Left knee warm, swollen, tenderness with ROM     Neurological:      Mental Status: She is alert.           Assessment:       1. Labile hypertension        2. Essential hypertension  losartan (COZAAR) 100 MG tablet      3. S/P knee surgery  oxyCODONE-acetaminophen (PERCOCET)  5-325 MG per tablet      4. Chronic pain of left knee  oxyCODONE-acetaminophen (PERCOCET) 5-325 MG per tablet         ?pain contributing to BP fluctuations.       Plan:      Procedures  No orders of the defined types were placed in this encounter.      Rx refills. Continue meds  Rx percocet prn  Monitor home bp  Keep f/u with card, ortho  F/u 4 wks

## 2021-06-12 NOTE — Progress Notes (Signed)
Have you seen any specialists/other providers since your last visit with [place]?    Yes, Orthopedic     Arm preference verified?   No    The patient is due for influenza vaccine, pneumonia vaccine, and statin use, Tetanus ten-year, MAWV, covid-19 vaccine 3

## 2021-06-18 ENCOUNTER — Telehealth (HOSPITAL_BASED_OUTPATIENT_CLINIC_OR_DEPARTMENT_OTHER): Payer: Self-pay

## 2021-06-18 ENCOUNTER — Ambulatory Visit (INDEPENDENT_AMBULATORY_CARE_PROVIDER_SITE_OTHER): Payer: Medicare Other | Admitting: Internal Medicine

## 2021-06-18 ENCOUNTER — Encounter (INDEPENDENT_AMBULATORY_CARE_PROVIDER_SITE_OTHER): Payer: Self-pay | Admitting: Internal Medicine

## 2021-06-18 VITALS — BP 161/84 | HR 65 | Temp 98.1°F | Resp 14 | Ht 60.98 in | Wt 139.4 lb

## 2021-06-18 DIAGNOSIS — M1712 Unilateral primary osteoarthritis, left knee: Secondary | ICD-10-CM

## 2021-06-18 DIAGNOSIS — Z9889 Other specified postprocedural states: Secondary | ICD-10-CM

## 2021-06-18 DIAGNOSIS — G8929 Other chronic pain: Secondary | ICD-10-CM

## 2021-06-18 DIAGNOSIS — Z01818 Encounter for other preprocedural examination: Secondary | ICD-10-CM

## 2021-06-18 DIAGNOSIS — M25562 Pain in left knee: Secondary | ICD-10-CM

## 2021-06-18 DIAGNOSIS — R0989 Other specified symptoms and signs involving the circulatory and respiratory systems: Secondary | ICD-10-CM

## 2021-06-18 NOTE — Progress Notes (Signed)
Subjective:      Patient ID: Amber [name] is a 70 y.o. female.    Chief Complaint:  Chief Complaint   Patient presents with    Pre-op Exam     Knee surgery       HPI:  HPI  Pt is here for a pre-op medical clearance.      Scheduled for left knee arthroscopy 06/25/21 with Dr. Janice Norrie  Dx, s/p knee replacement , chronic pain,     HTN, home BP 140-150/80's, no cp, no sob.       Problem List:  Patient Active Problem List   Diagnosis    Essential hypertension    Anxiety state, unspecified    Abnormal electrocardiogram    Cerebral infarction    Gastroesophageal reflux disease    Hypernatremia    Paresthesia    Slow transit constipation    Vitamin D deficiency    Overactive bladder    S/P laparoscopic cholecystectomy    Transient cerebral ischemia, unspecified type    Palpitations    Pituitary microadenoma    Dyslipidemia    Other long term (current) drug therapy    Headache    History of spinal surgery    Obstructive sleep apnea syndrome    Hiatal hernia    Osteoarthritis of knees, bilateral    Bariatric surgery status    Nutritional deficiency    Anxiety    Epigastric abdominal tenderness without rebound tenderness    Encounter for vitamin deficiency screening    Nausea    Other dysphagia    Small bowel obstruction due to adhesions    Allergic rhinitis    Chronic frontal sinusitis    Chronic sphenoidal sinusitis    Sinusitis    Primary osteoarthritis of both knees    Labile hypertension    Postsurgical dumping syndrome    Hypercholesterolemia    History of chronic sinusitis    Mild intermittent asthma without complication       Current Medications:  Current Outpatient Medications   Medication Sig Dispense Refill    aspirin EC 81 MG EC tablet Take 1 tablet (81 mg total) by mouth daily. 30 tablet 1    atenolol (TENORMIN) 50 MG tablet TAKE 1 TABLET(50 MG) BY MOUTH TWICE DAILY 180 tablet 1    benzonatate (TESSALON) 100 MG capsule Take 1 capsule (100 mg total) by mouth 3 (three) times daily as needed for Cough 30 capsule 1     Cyanocobalamin (B-12 IJ) Inject as directed every 30 (thirty) days         diazePAM (VALIUM) 5 MG tablet Take 1 tablet (5 mg total) by mouth every 8 (eight) hours as needed for Anxiety 30 tablet 2    famotidine (PEPCID) 20 MG tablet Take 1 tablet (20 mg total) by mouth daily 90 tablet 1    fluticasone (FLONASE) 50 MCG/ACT nasal spray 2 sprays by Nasal route daily. (Patient taking differently: 2 sprays by Nasal route as needed) 16 g 2    lidocaine (XYLOCAINE) 2 % jelly Apply topically as needed (sore) 30 mL 1    losartan (COZAAR) 100 MG tablet Take 1 tablet (100 mg total) by mouth daily 90 tablet 1    Multiple Vitamin (MULTIVITAMIN) capsule Take 1 capsule by mouth daily      ondansetron (ZOFRAN-ODT) 4 MG disintegrating tablet Take 1 tablet (4 mg total) by mouth every 8 (eight) hours as needed for Nausea 30 tablet 2    oxyCODONE-acetaminophen (PERCOCET) 5-325 MG  per tablet Take one to two tablets po bid as needed for pain 60 tablet 0    pantoprazole (PROTONIX) 40 MG tablet Take 1 tablet (40 mg total) by mouth daily 90 tablet 1    traMADol (ULTRAM) 50 MG tablet 50 mg      valACYclovir HCL (VALTREX) 500 MG tablet TAKE 1 TABLET(500 MG) BY MOUTH DAILY 90 tablet 1    vitamin D, ergocalciferol, (DRISDOL) 50000 UNIT Cap Take 1 capsule (50,000 Units total) by mouth once a week (Patient not taking: Reported on 06/18/2021) 12 capsule 1     No current facility-administered medications for this visit.       Allergies:  Allergies   Allergen Reactions    Acyclovir Nausea And Vomiting     Other reaction(s): gi distress    Amlodipine      nausea    Amoxicillin-Pot Clavulanate      Other reaction(s): gi distress      Atorvastatin        Palpitation    Carvedilol      Extreme fatigue    Hydralazine      Headache      Lisinopril      nausea    Lovastatin Nausea And Vomiting     Other reaction(s): gi distress    Metronidazole Nausea And Vomiting     Other reaction(s): gi distress    Moxifloxacin Nausea And Vomiting         GI symptoms     Rosuvastatin      Palpitation, chest pain, abd pain     Statins      Chest pain, palpitations.    Sulfa Antibiotics Nausea And Vomiting     Other reaction(s): gi distress       Past Medical History:  Past Medical History:   Diagnosis Date    Asthma     Bronchitis 01/02/2019    Chest pain 2016     Chest pain after eating x6  months--being followed by GI for GERD    Claustrophobia     MRIs    Constipation     Genital herpes     no current outbreak (10/03/15)    GERD (gastroesophageal reflux disease)     Hiatal hernia     h/o surgery    Hyperlipidemia     Hypertensive disorder     Well controlled on meds per pt. Elevated with pain.    Low back pain     Nausea without vomiting     OSA on CPAP 2015    CPAP nightly x 1 yr, used nightly.    Osteoarthritis of knees, bilateral     and back    SBO (small bowel obstruction) 09/2018    TIA (transient ischemic attack) 2014    TIA 2014-no residual. Patient evaulated for possible TIA 07/12/2015  @ [facility] (dischage summary in epic)-per summary CT of head was normal and patient was d/c'd       Past Surgical History:  Past Surgical History:   Procedure Laterality Date    APPENDECTOMY (OPEN)  >25 yrs    CHOLECYSTECTOMY      EGD  01/2015    EGD N/A 01/23/2019    Procedure: EGD;  Surgeon: Champ Mungo, MD;  Location: [facility];  Service: General;  Laterality: N/A;  ENDOSCOPY  MD REQ=30MINS; Q1=UNK    EGD, BIOPSY N/A 09/15/2017    Procedure: EGD, BIOPSY;  Surgeon: Pershing Proud, MD;  Location: [facility];  Service: Gastroenterology;  Laterality: N/A;    fallopian tube surgery  >25 yrs    INSERTION, PAIN PUMP (MEDICAL) N/A 10/22/2015    Procedure: INSERTION, PAIN PUMP (MEDICAL);  Surgeon: Nicola Police, DO;  Location: [facility];  Service: General;  Laterality: N/A;    JOINT REPLACEMENT      LAPAROSCOPIC, CHOLECYSTECTOMY, CHOLANGIOGRAM  08/13/2014    LAPAROSCOPIC, GASTRIC BYPASS N/A 10/22/2015    Procedure: LAPAROSCOPIC, GASTRIC BYPASS;  Surgeon: Nicola Police, DO;  Location: [facility];  Service: General;  Laterality: N/A;    LAPAROSCOPIC, HERNIORRHAPHY, HIATAL N/A 10/22/2015    Procedure: LAPAROSCOPIC, HERNIORRHAPHY, HIATAL;  Surgeon: Nicola Police, DO;  Location: [facility];  Service: General;  Laterality: N/A;    LAPAROSCOPIC, LYSIS, ADHESIONS N/A 09/01/2018    Procedure: LAPAROSCOPIC, LYSIS, ADHESIONS RELEASE OF  SMALL BOWEL OBSTRUCTION;  Surgeon: Champ Mungo, MD;  Location: [facility];  Service: General;  Laterality: N/A;    LAPAROSCOPY, DIAGNOSTIC N/A 09/01/2018    Procedure: LAPAROSCOPY, DIAGNOSTIC;  Surgeon: Champ Mungo, MD;  Location: [facility];  Service: General;  Laterality: N/A;    OVARY SURGERY Right >25 yrs    SPINE SURGERY  11/2013    cervical HNP x 2, Dr. Bufford Buttner    TOOTH EXTRACTION N/A        Family History:  Family History   Problem Relation Age of Onset    Cancer Mother 59        breast cancer    Breast cancer Mother     Heart disease Father     Malignant hyperthermia Neg Hx     Pseudochol deficiency Neg Hx        Social History:  Social History     Socioeconomic History    Marital status: Single    Number of children: 2   Occupational History    Occupation: Acupuncturist: [employer]    Tobacco Use    Smoking status: Never    Smokeless tobacco: Never   Vaping Use    Vaping Use: Never used   Substance and Sexual Activity    Alcohol use: Not Currently    Drug use: Never    Sexual activity: Not Currently     Partners: Male     Birth control/protection: Post-menopausal   Other Topics Concern    Dietary supplements / vitamins Yes    Anesthesia problems No    Blood thinners No    Eats large amounts No    Excessive Sweets No    Skips meals No    Eats excessive starches Yes    Snacks or grazes No    Emotional eater Yes    Eats fried food Yes    Eats fast food Yes    Diet Center No    HMR No    [name] No    LA Weight Loss No    Nutri-System No    Opti-Fast / Medi-Fast No     Overeaters Anonymous No    [facility] Weight Loss Center No    TOPS No    Weight Watchers No    Atkins No    Binging / Purging No    Body for Life No    Cabbage Soup No    Calorie Counting No    Fasting No    [name] No    Health Spa No  Herbal Life No    High Protein No    Low Carb No    Low Fat No    [facility] Diet No    Pritkin Diet No    Richard Simmons Diet No    [place] Diet No    Slim Fast No    [place] No    Sugar Busters No    Vomiting No    Zone Diet No    Stationary cycle or treadmill No    Gym/fitness Classes No    Home exercise/video No    Swimming No    Team sports No    Weight training No    Walking or running No    Hospitalization No    Hypnosis No    Physical therapy No    Psychological therapy No    Residential program No    Acutrim No    Amphetamines No    Anorex No    Byetta No    Dexatrim No    Didrex No    Fastin No    Fen - Phen No    Ionamin / Adipex No    Mazanor No    Meridia No    Obalan No    Phendiet No    Phentrol No    Phentermine Yes    Plegine No    Pondimin No    Qsymia No    Prozac No    Redux No    Sanorex No    Tenuate No    Tepanole No    Wechless No    Wellbutrin No    Xenical (Orlistat, Alli) No    Other Med No    No impairment Yes     Comment: Chronic Bilat Knee Pain    Walks with cane/crutch Yes    Requires a wheelchair No    Bedridden No    Are you currently being treated for depression? No    Do you snore? Yes    Are you receiving any medical or psychological services? No    Do you have or have you been treated for an eating disorder? No    Do you exercise regularly? No    Have you or family member ever have trouble with anesthesia? No     Social Determinants of Psychologist, prison and probation services Strain: Low Risk     Difficulty of Paying Living Expenses: Not hard at all   Food Insecurity: No Food Insecurity    Worried About Programme researcher, broadcasting/film/video in the Last Year: Never true    Ran Out of Food in the Last Year: Never true   Transportation Needs: No Transportation  Needs    Lack of Transportation (Medical): No    Lack of Transportation (Non-Medical): No   Physical Activity: Inactive    Days of Exercise per Week: 0 days    Minutes of Exercise per Session: 0 min   Stress: No Stress Concern Present    Feeling of Stress : Not at all   Social Connections: Moderately Integrated    Frequency of Communication with Friends and Family: More than three times a week    Frequency of Social Gatherings with Friends and Family: More than three times a week    Attends Religious Services: More than 4 times per year    Active Member of [employer] or Organizations: Yes    Attends Banker Meetings: More than 4 times per year  Marital Status: Never married   Housing Stability: Unknown    Unable to Pay for Housing in the Last Year: No    Unstable Housing in the Last Year: No        The following sections were reviewed this encounter by the provider:   Allergies  Meds  Problems  Med Hx  Surg Hx         ROS:  Review of Systems   Constitutional:  Negative for appetite change, chills, fever and unexpected weight change.   Respiratory:  Negative for shortness of breath.    Cardiovascular:  Negative for chest pain and palpitations.   Gastrointestinal:  Positive for diarrhea. Negative for abdominal pain, constipation, nausea and vomiting.   Genitourinary:  Negative for dysuria and frequency.   Musculoskeletal:  Positive for arthralgias.   Neurological:  Negative for syncope and light-headedness.   Hematological:  Does not bruise/bleed easily.     No prior problem with anesthesia  Vitals:  BP 161/84 (BP Site: Right arm, Patient Position: Sitting, Cuff Size: Medium)   Pulse 65   Temp 98.1 F (36.7 C) (Temporal)   Resp 14   Ht 1.549 m (5' 0.98")   Wt 63.2 kg (139 lb 6.4 oz)   LMP  (LMP Unknown)   BMI 26.35 kg/m      Objective:     Physical Exam:  Physical Exam  Vitals reviewed.   Constitutional:       General: She is not in acute distress.     Appearance: She is well-developed.   Eyes:       General: No scleral icterus.     Conjunctiva/sclera: Conjunctivae normal.   Neck:      Thyroid: No thyromegaly.      Vascular: No carotid bruit or JVD.   Cardiovascular:      Rate and Rhythm: Normal rate and regular rhythm.      Heart sounds: Normal heart sounds. No murmur heard.    No friction rub. No gallop.   Pulmonary:      Effort: Pulmonary effort is normal. No respiratory distress.      Breath sounds: Normal breath sounds. No rales.   Abdominal:      General: Bowel sounds are normal. There is no distension.      Palpations: Abdomen is soft.      Tenderness: There is no abdominal tenderness. There is no guarding or rebound.   Musculoskeletal:         General: Swelling and tenderness present.      Cervical back: Neck supple.      Right lower leg: No edema.      Left lower leg: No edema.   Neurological:      Mental Status: She is alert.     EKG, NSR, no acute ischemic changes  CBC, CMP Labs done recently at hosp normal, stable     Assessment:     1. Pre-op examination  - PR ECG ROUTINE ECG W/LEAST 12 LDS W/I&R    2. Labile hypertension  - PR ECG ROUTINE ECG W/LEAST 12 LDS W/I&R    3. Chronic pain of left knee    4. Primary osteoarthritis of left knee    Plan:     Cleared for surgery  Hold aspirin 1 wk prior to surgery  Continue meds  Monitor home BP  Keep routine f/u ov    Anays Detore Karie Kirks, MD

## 2021-06-18 NOTE — Progress Notes (Signed)
Have you seen any specialists/other providers since your last visit with [place]?    No    Arm preference verified?   Yes    The patient is due for tetanus, pneumonia vaccine, medicare wellness

## 2021-06-18 NOTE — Telephone Encounter (Signed)
Spoke with Jasmine Hunt via phone to confirm her upcoming new patient appointment on Monday, August 22nd. Also confirmed her first intake call with the nurse on Friday. She also mentioned she has filled out a good portion of the new patient questionnaire online.

## 2021-06-20 ENCOUNTER — Ambulatory Visit: Payer: No Typology Code available for payment source

## 2021-06-20 ENCOUNTER — Other Ambulatory Visit (HOSPITAL_BASED_OUTPATIENT_CLINIC_OR_DEPARTMENT_OTHER): Payer: Self-pay

## 2021-06-20 ENCOUNTER — Encounter (INDEPENDENT_AMBULATORY_CARE_PROVIDER_SITE_OTHER): Payer: Self-pay | Admitting: Internal Medicine

## 2021-06-23 ENCOUNTER — Other Ambulatory Visit (HOSPITAL_BASED_OUTPATIENT_CLINIC_OR_DEPARTMENT_OTHER): Payer: Self-pay | Admitting: Gastroenterology

## 2021-06-23 ENCOUNTER — Ambulatory Visit (HOSPITAL_BASED_OUTPATIENT_CLINIC_OR_DEPARTMENT_OTHER): Payer: No Typology Code available for payment source | Admitting: Hematology & Oncology

## 2021-06-23 ENCOUNTER — Ambulatory Visit: Payer: No Typology Code available for payment source | Attending: Hematology & Oncology

## 2021-06-23 VITALS — BP 150/83 | HR 98 | Temp 98.2°F | Resp 19 | Ht 62.0 in | Wt 165.4 lb

## 2021-06-23 DIAGNOSIS — Z803 Family history of malignant neoplasm of breast: Secondary | ICD-10-CM | POA: Insufficient documentation

## 2021-06-23 DIAGNOSIS — Z1501 Genetic susceptibility to malignant neoplasm of breast: Secondary | ICD-10-CM | POA: Insufficient documentation

## 2021-06-23 DIAGNOSIS — C50911 Malignant neoplasm of unspecified site of right female breast: Secondary | ICD-10-CM | POA: Insufficient documentation

## 2021-06-23 DIAGNOSIS — Z8042 Family history of malignant neoplasm of prostate: Secondary | ICD-10-CM | POA: Insufficient documentation

## 2021-06-23 DIAGNOSIS — C50912 Malignant neoplasm of unspecified site of left female breast: Secondary | ICD-10-CM | POA: Insufficient documentation

## 2021-06-23 DIAGNOSIS — Z1509 Genetic susceptibility to other malignant neoplasm: Secondary | ICD-10-CM

## 2021-06-23 DIAGNOSIS — Z800 Family history of malignant neoplasm of digestive organs: Secondary | ICD-10-CM | POA: Insufficient documentation

## 2021-06-23 DIAGNOSIS — Z08 Encounter for follow-up examination after completed treatment for malignant neoplasm: Secondary | ICD-10-CM | POA: Insufficient documentation

## 2021-06-23 DIAGNOSIS — Z90722 Acquired absence of ovaries, bilateral: Secondary | ICD-10-CM | POA: Insufficient documentation

## 2021-06-23 DIAGNOSIS — Z853 Personal history of malignant neoplasm of breast: Secondary | ICD-10-CM | POA: Insufficient documentation

## 2021-06-23 DIAGNOSIS — Z923 Personal history of irradiation: Secondary | ICD-10-CM | POA: Insufficient documentation

## 2021-06-23 DIAGNOSIS — Z9221 Personal history of antineoplastic chemotherapy: Secondary | ICD-10-CM | POA: Insufficient documentation

## 2021-06-23 LAB — COMPREHENSIVE METABOLIC PANEL
ALT (GPT): 25 U/L (ref 7–33)
AST (GOT): 28 U/L (ref 9–38)
Albumin: 4.5 g/dL (ref 3.5–5.2)
Alkaline Phosphatase (Total): 104 U/L (ref 38–172)
Anion Gap: 9 (ref 4–12)
Bilirubin (Total): 0.6 mg/dL (ref 0.2–1.3)
Calcium: 9.7 mg/dL (ref 8.9–10.2)
Carbon Dioxide, Total: 25 meq/L (ref 22–32)
Chloride: 106 meq/L (ref 98–108)
Creatinine: 0.8 mg/dL (ref 0.38–1.02)
Glucose: 91 mg/dL (ref 62–125)
Potassium: 3.7 meq/L (ref 3.6–5.2)
Protein (Total): 8 g/dL (ref 6.0–8.2)
Sodium: 140 meq/L (ref 135–145)
Urea Nitrogen: 17 mg/dL (ref 8–21)
eGFR by CKD-EPI 2021: >60 mL/min/{1.73_m2} (ref >59)

## 2021-06-23 LAB — CBC, DIFF
% Basophils: 1 %
% Eosinophils: 2 %
% Immature Granulocytes: 0 %
% Lymphocytes: 25 %
% Monocytes: 9 %
% Neutrophils: 64 %
Absolute Eosinophil Count: 0.17 10*3/uL (ref 0.00–0.50)
Absolute Lymphocyte Count: 1.88 10*3/uL (ref 1.00–4.80)
Basophils: 0.04 10*3/uL (ref 0.00–0.20)
Hematocrit: 42 % (ref 36.0–45.0)
Hemoglobin: 14 g/dL (ref 11.5–15.5)
Immature Granulocytes: 0.03 10*3/uL (ref 0.00–0.05)
MCH: 28.4 pg (ref 27.3–33.6)
MCHC: 33.2 g/dL (ref 32.2–36.5)
MCV: 86 fL (ref 81–98)
Monocytes: 0.65 10*3/uL (ref 0.00–0.80)
Neutrophils: 4.85 10*3/uL (ref 1.80–7.00)
Platelet Count: 327 10*3/uL (ref 150–400)
RBC: 4.93 10*6/uL (ref 3.80–5.00)
RDW-CV: 15.1 % — ABNORMAL HIGH (ref 11.6–14.4)
WBC: 7.62 10*3/uL (ref 4.3–10.0)

## 2021-06-23 LAB — CA 19-9: Ca 19-9: 21 U/mL (ref 0–54)

## 2021-06-23 NOTE — Progress Notes (Signed)
[facility]    IDENTIFICATION  Jasmine Hunt is a 70 year old female who is here for initial consultation with history of nonsynchronous bilateral breast carcinoma.    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  Initially she was tested negative but then it turns out that she has a BRCA1 mutation and she underwent bilateral salpingo-oophorectomy and she has been followed by GI with endoscopies and pancreatic MRI.  Overall patient is doing quite well and she also has bilateral mammograms alternating with bilateral MRIs every 6 months.  Her last test was in March and it was a normal bilateral mammogram.  Today I had a long conversation with the patient and she asked about possibility of bilateral mastectomy and although I told her that should reduce her risk of a new breast cancer it does not completely eliminate the chance secondary to embryologic tissue.  Also she has been doing well for the last 16 years.  She says that she is getting tired of having images every 6 months and I told her that we may consider doing breast imaging on yearly basis if her next bilateral MRI which I ordered today turns out to be negative.  Today I also want to do blood work including breast cancer and pancreatic cancer markers.  We are going to check a vitamin D level.  We plan to see patient back for follow-up in 6 months or earlier if necessary.        Moacyr Ribeiro de Oliveira, MD  Medical Oncology, [facility]        Time Statement: I spent a total of 75 minutes for the patient's care on the  date of the service.        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  She also received radiation therapy.  -2006: She developed stage I left breast carcinoma also triple negative and she underwent lumpectomy followed by chemotherapy with CMF followed by radiation therapy.  -Patient initially had tested negative for genetic testing but she was referred to [facility] and had more in-depth testing and it turns out that she was BRCA positive and she underwent bilateral salpingo-oophorectomy.  For the last few years patient is also following with GI at [facility] and she has had EGDs down to the pancreas and last year she had MRI of the pancreas that was negative and the plan is now to alternate EGD and MRI.  -It turns out she has strong family history of cancer with her father had prostate cancer in his 60s, mother diagnosed with pancreatic cancer at age 80.  1 sister had breast cancer x2 and another sister had breast cancer and also pancreatic cancer.  -2016 and 2020 patient had breast biopsies that were negative.    CURRENT TREATMENT  Surveillance    INTERVAL HISTORY    Last seen: Initial consultation.   The patient is a delightful 70-year-old white female who is   here for initial consultation and continuation of care regarding bilateral breast carcinoma.  She has been followed by Dr. Lissa Merlin at [facility] but she just retired.  Oncology history as above but briefly in 2001 patient had right breast carcinoma, node positive and she underwent lumpectomy followed by chemotherapy with AC plus T followed by radiation therapy and almost 5 years later she developed stage I left breast carcinoma also triple negative and she underwent chemotherapy with CMF followed by  radiation therapy.  Unfortunately I do not have all the details about her cancers including grade.  Looks like initially patient was tested negative for the genetic mutation but then she was referred to [facility] and looks like she turned out to be BRCA1 positive.  She has extensive history of cancer in her family as above including father with prostate, mother with pancreatic, sister with pancreatic and breast and another sister with breast cancer x2.  Looks like her brother who does not have cancer just test positive.  Patient also developed left breast carcinoma in 2006 and it was a stage I and she underwent 6 cycles of CMF chemotherapy followed by radiation therapy.  Also in 2006 she underwent bilateral salpingo-oophorectomy and for the last few years she has been following with GI in [place] and having EGD down to the pancreas.  She also had MRI of the pancreas in September 2021 that was unremarkable.  Overall patient is doing well and she denies any problems specifically she denies any issues with her breasts, she has no abdominal pain or any symptoms.  Past medical history is remarkable for hypertension and hyperlipidemia.  Patient is separated and she resides by herself in [place].  She has 2 grown sons and 1 resides close to her and the other 1 lives in [place].  She is a lifelong non-smoker and she does not drink alcoholic beverages.  She works for the [employer].  She reports that her weight stable and that she is exercise frequently especially if walks.     Currently denies fevers, chills, sweats, weight loss, URI symptoms, cough, chest pain or pressure, syncope, n/v/d, dysuria, rash, LE edema, focal neuro deficits.    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        FAMILY HISTORY          SOCIAL HISTORY    Social History     Social History Narrative    Not on file           ALLERGIES    Review of patient's allergies indicates:  Allergies   Allergen Reactions    Dust Mite Mixed Allergen Ext [Mite (D. Farinae)] Unknown     Allergy test came back positive for dust, reaction is unknown.         MEDICATIONS      Current Outpatient Medications:     amLODIPine 5 MG tablet, Take 5 mg by mouth daily., Disp: , Rfl:     atorvastatin 20 MG tablet, Take 20 mg by mouth at bedtime., Disp: , Rfl:  cholecalciferol 50 mcg (2,000 unit) capsule, Take 2,000 units by mouth., Disp: , Rfl:     Cyanocobalamin (VITAMIN B-12 OR), Take 1 tablet by mouth as needed., Disp: , Rfl:     mometasone 50 MCG/ACT nasal spray, Spray 2 sprays in the nostril., Disp: , Rfl:     Multiple Vitamin (Multi-Vitamin) tablet, Take 1 tablet by mouth., Disp: , Rfl:     Omega-3 Fatty Acids (OMEGA 3 OR), Take 1 tablet by mouth as needed., Disp: , Rfl:     OMEPRAZOLE OR, Take 1 tablet by mouth as needed., Disp: , Rfl:     Other Meds (See Sig/Instructions), Take 1 tablet by mouth as needed for other. Co-Q 10, Disp: , Rfl:       REVIEW OF SYSTEMS  A full ROS was obtained as part of the intake form, which has been reviewed and signed.   As per Interval History above, a complete ROS is otherwise negative.      PHYSICAL EXAM    BP (!) 150/83    Pulse 98    Temp 36.8 C (Oral)    Resp 19    Ht 5' 2" (1.575 m)    Wt 75 kg (165 lb 6.4 oz)    SpO2 97%    BMI 30.25 kg/m     ECOG: 0  General: Pleasant, well-appearing. No acute distress.  HEENT: Sclerae anicteric. Oral mucosa without lesion.   Cardiac: Regular rate and rhythm.  Lungs: Clear to auscultation bilaterally. Breathing unlabored.  Breast: Well-healed the scars over both breasts.  No palpable masses.  No nipple changes and no palpable bilateral  axillary nodes.  Abdomen: Bowel sounds present. Soft, nontender, nondistended.   Neuro: Alert and oriented x4. No focal deficits.  Skin: Without rash.  Extremities: No LE edema.        LABORATORY RESULTS    Results for orders placed or performed in visit on 04/24/19   COVID-19 Coronavirus Qualitative PCR   Result Value Ref Range    COVID-19 Coronavirus Qual PCR Specimen Type Nasopharyngeal Swab     COVID-19 Coronavirus Qual PCR Result None detected NDET    COVID-19 Coronavirus Qual PCR Interpretation       This is a negative result. Laboratory testing alone cannot rule out infection, particularly in the presence of clinical risk factors such as symptoms or exposure history.           DIAGNOSTIC STUDIES    No orders to display

## 2021-06-24 ENCOUNTER — Encounter (HOSPITAL_COMMUNITY): Payer: Self-pay

## 2021-06-24 ENCOUNTER — Telehealth (HOSPITAL_COMMUNITY): Payer: Self-pay

## 2021-06-24 DIAGNOSIS — Z800 Family history of malignant neoplasm of digestive organs: Secondary | ICD-10-CM

## 2021-06-24 LAB — CA 15.3: CA 15.3: 10 U/mL (ref 0–20)

## 2021-06-24 NOTE — Telephone Encounter (Addendum)
GI Health Assessment Questions    Estimated body mass index is 30.25 kg/m as calculated from the following:    Height as of 06/23/21: '5\' 2"'$  (1.575 m).    Weight as of 06/23/21: 75 kg (165 lb 6.4 oz).    HT: 5'2"   WT::165 LBS  BMI: 30.25    Patient Assessment      [place] [facility] ANESTHESIA CASES   Have you tested POSITIVE for Covid-19 in the last 8 weeks? No  What was the date of your positive result?   If yes:  send a TE to El[facility] Kirschner and [place] for further review -- DO NOT SCHEDULE   If yes, but order is URGENT/EMERGENT -- ok to continue with scheduling. No C19 Test needed for patient.     For [facility] Anesthesia     Have you had a previous Endoscopy? Yes  If yes:  Where? [facility]  When? 2020  Procedure Type? EUS    Are you taking any Anti-coagulant or Anti-Platelet Medications? No  If yes, which medication are you taking:    Prescribing provider:    Inform patient to call the doctor who prescribed these medicines and ask for special instructions for your blood-thinning medicines     Are you on dialysis? No  If yes to any kind of Dialysis, OK to schedule. Advised patient to come dry/drained.   [facility] - DO NOT SCHEDULE  [facility] - route TE to Charge Nurse Pool for further review  [facility] - note in appointment line    Do you have a Pacemaker or Defibrillator? No  If yes, what kind of device do you have (Ex. [address], [place], [employer])?    If yes, name of Cardiologist?     Internal [place] Cardiologist, fax device form to patient's provider, route TE to "p [facility] Charge Nurse Pool" for further review   External Cardiologist, fax device form to patient's provider and route TE to Charge Nurse Pool for further review  If yes to pacemaker/defibrillator, DO NOT SCHEDULE AT [facility]     Are you Diabetic?  No  Inform patient to call the doctor who prescribed these medicines and ask for special instructions for your diabetes medications     Are you a difficult IV start? No  Do you require an ultrasound machine to assist with IV  start? No  If yes to ultrasound start, arranged for 90 min early arrival time?    If yes to diff. IV/a few pokes, arranged for 60 min early arrival time?    If yes to ultrasound, DO NOT SCHEDULE AT [facility] & DO NOT SCHEDULE FIRST CASE OF THE DAY    Are you wheelchair bound? No  If yes, DO NOT SCHEDULE AT [facility]     Is the patient scheduled for an ERCP? No  If yes, do you have an allergy to contrast, iodine or shellfish? If yes, what are your symptoms? No  If yes, ok to schedule. Routed TE to Interventional Pool for further evaluation of contrast allergies? No    Is the patient scheduled for an ALS PEG or PEG? No  If yes, do you have a CPAP/BIPAP? No  If yes, are you able to remove your CPAP or BiPAP mask on your own? No  If no, arranged for an ICU bed post procedure? No    Is the patient scheduled for a Therapeutic procedure? Yes  If yes, please obtain IMAGING.    PROCEDURE  Procedure MD: Dr. Sandford Craze  Procedure Type: EUS  Procedure Date:  08/01/21        Time:     10:00  Procedure Check-In Time:  9:00    Patient Teaching    Was the topic of blood thinners taught?  Yes     Reminder that family member or friend must escort patient? Yes     What is the name of their driver? Brother    How were the procedure preparation instruction materials delivered?  Verbal: No  On Paper: No  eCare message: No  Email: Yes  E-mail address: On file     [employer] Appointment Confirmation Sent: Yes     Does this procedure require bowel prep? No  If yes, were instructions for the ordered laxative taught? No  If yes, which RX was prescribed? N/A  Pharmacy?       General Notes:

## 2021-06-24 NOTE — Telephone Encounter (Signed)
C19 schd @ [facility] [place]  on 07/29/21 @ 3 p.m..    EUS  shcd @  [facility] on 08/01/21 @ 10 a.m. with Dr. Tye Savoy    C19 order pended to MD.

## 2021-06-25 LAB — VITAMIN D (25 HYDROXY)
Vit D (25_Hydroxy) Interp: HIGH
Vit D (25_Hydroxy) Total: 55.1 ng/mL — ABNORMAL HIGH (ref 20.1–50.0)
Vitamin D2 (25_Hydroxy): <1 ng/mL
Vitamin D3 (25_Hydroxy): 55.1 ng/mL

## 2021-06-30 ENCOUNTER — Telehealth (HOSPITAL_BASED_OUTPATIENT_CLINIC_OR_DEPARTMENT_OTHER): Payer: Self-pay

## 2021-06-30 NOTE — Telephone Encounter (Signed)
Patient stopped by the front desk today to ask about her breast MRI and if it was sent to [place]. Can you please follow up with the patient?

## 2021-07-01 NOTE — Telephone Encounter (Signed)
Faxed to [facility] 8/29.Notified patient.     Gary Fleet, RN

## 2021-07-06 ENCOUNTER — Encounter (HOSPITAL_BASED_OUTPATIENT_CLINIC_OR_DEPARTMENT_OTHER): Payer: Self-pay | Admitting: Emergency Medicine

## 2021-07-06 ENCOUNTER — Emergency Department (HOSPITAL_BASED_OUTPATIENT_CLINIC_OR_DEPARTMENT_OTHER)
Admission: EM | Admit: 2021-07-06 | Discharge: 2021-07-06 | Disposition: A | Discharge status: Home / Self Care | Payer: Medicare Other | Attending: Emergency Medicine | Admitting: Emergency Medicine

## 2021-07-06 ENCOUNTER — Other Ambulatory Visit: Payer: Self-pay

## 2021-07-06 ENCOUNTER — Emergency Department (HOSPITAL_BASED_OUTPATIENT_CLINIC_OR_DEPARTMENT_OTHER): Payer: Medicare Other

## 2021-07-06 DIAGNOSIS — S0990XA Unspecified injury of head, initial encounter: Secondary | ICD-10-CM | POA: Diagnosis present

## 2021-07-06 DIAGNOSIS — S060X0A Concussion without loss of consciousness, initial encounter: Secondary | ICD-10-CM | POA: Diagnosis not present

## 2021-07-06 DIAGNOSIS — Y9353 Activity, golf: Secondary | ICD-10-CM | POA: Diagnosis not present

## 2021-07-06 DIAGNOSIS — W01198A Fall on same level from slipping, tripping and stumbling with subsequent striking against other object, initial encounter: Secondary | ICD-10-CM | POA: Insufficient documentation

## 2021-07-06 NOTE — Discharge Instructions (Addendum)
Return if any problems.

## 2021-07-06 NOTE — ED Notes (Signed)
Patient transported to CT 

## 2021-07-06 NOTE — ED Triage Notes (Signed)
Pt reports fell off golf cart yesterday, hitting head on concrete pavers; no LOC, no blood thinners; c/o frontal HA and intermittent dizziness

## 2021-07-07 NOTE — ED Provider Notes (Signed)
[facility] EMERGENCY DEPARTMENT Provider Note   CSN: [id number] Arrival date & time: 07/06/21  1310     History Chief Complaint  Patient presents with   Head Injury    Rachel Wilkinson is a 70 y.o. female.  The history is provided by the patient. No language interpreter was used.  Head Injury Location:  Occipital Time since incident:  1 day Mechanism of injury: fall   Fall:    Fall occurred: off a golf cart.   Point of impact:  Head   Entrapped after fall: no   Pain details:    Quality:  Aching   Severity:  Moderate   Duration:  1 day   Timing:  Constant   Progression:  Worsening Chronicity:  New Worsened by:  Nothing Ineffective treatments:  None tried Associated symptoms: headache   Associated symptoms comment:  Vertigo Risk factors: no alcohol use       History reviewed. No pertinent past medical history.  There are no problems to display for this patient.   History reviewed. No pertinent surgical history.   OB History   No obstetric history on file.     No family history on file.  Social History   Tobacco Use   Smoking status: Never   Smokeless tobacco: Never  Substance Use Topics   Alcohol use: Not Currently   Drug use: Never    Home Medications Prior to Admission medications   Not on File    Allergies    Patient has no known allergies.  Review of Systems   Review of Systems  Neurological:  Positive for headaches.  All other systems reviewed and are negative.  Physical Exam Updated Vital Signs BP (!) 150/83 (BP Location: Right Arm)   Pulse 67   Temp 98.2 F (36.8 C) (Oral)   Resp 16   Ht 5' 3.25" (1.607 m)   Wt 78.9 kg   SpO2 100%   BMI 30.58 kg/m   Physical Exam Vitals reviewed.  Constitutional:      Appearance: Normal appearance.  HENT:     Head: Normocephalic.     Comments: Tender occipital scalp.      Right Ear: Tympanic membrane normal.     Left Ear: Tympanic membrane normal.     Nose: Nose normal.      Mouth/Throat:     Mouth: Mucous membranes are moist.  Eyes:     Extraocular Movements: Extraocular movements intact.     Pupils: Pupils are equal, round, and reactive to light.  Cardiovascular:     Rate and Rhythm: Normal rate.  Pulmonary:     Effort: Pulmonary effort is normal.  Abdominal:     General: Abdomen is flat.  Musculoskeletal:        General: Normal range of motion.     Cervical back: Normal range of motion.  Skin:    General: Skin is warm.  Neurological:     General: No focal deficit present.     Mental Status: She is alert.  Psychiatric:        Mood and Affect: Mood normal.    ED Results / Procedures / Treatments   Labs (all labs ordered are listed, but only abnormal results are displayed) Labs Reviewed - No data to display  EKG None  Radiology CT HEAD WO CONTRAST ( )  Result Date: 07/06/2021 CLINICAL DATA:  Blunt trauma post fall EXAM: CT HEAD WITHOUT CONTRAST TECHNIQUE: Contiguous axial images were obtained from the base of the  skull through the vertex without intravenous contrast. COMPARISON:  None. FINDINGS: Brain: No evidence of acute infarction, hemorrhage, hydrocephalus, extra-axial collection or mass lesion/mass effect. Vascular: No hyperdense vessel or unexpected calcification. Skull: Normal. Negative for fracture or focal lesion. Sinuses/Orbits: No acute finding.  Hypoplastic frontal sinuses. Other: None IMPRESSION: Negative Electronically Signed   By: Corlis Leak M.D.   On: 07/06/2021 15:23    Procedures Procedures   Medications Ordered in ED Medications - No data to display  ED Course  I have reviewed the triage vital signs and the nursing notes.  Pertinent labs & imaging results that were available during my care of the patient were reviewed by me and considered in my medical decision making (see chart for details).    MDM Rules/Calculators/A&P                           MDM:  Pt may have  mild concussion.  Pt counseled on symptoms, care  and follow up Final Clinical Impression(s) / ED Diagnoses Final diagnoses:  Concussion without loss of consciousness, initial encounter    Rx / DC Orders ED Discharge Orders     None     An After Visit Summary was printed and given to the patient.    Elson Areas, [place] 07/07/21 1637    Vanetta Mulders, MD 07/09/21 [phone]

## 2021-07-07 NOTE — ED Provider Notes (Deleted)
[facility] EMERGENCY DEPARTMENT Provider Note   CSN: [id number] Arrival date & time: 07/06/21  1310     History Chief Complaint  Patient presents with   Head Injury    Rachel Wilkinson is a 70 y.o. female.  The history is provided by the patient. No language interpreter was used.  Head Injury Location:  Occipital Time since incident:  1 day Mechanism of injury: direct blow   Pain details:    Quality:  Aching   Severity:  Moderate   Duration:  1 day   Timing:  Constant   Progression:  Worsening Relieved by:  Nothing Worsened by:  Nothing Ineffective treatments:  None tried Associated symptoms comment:  Vertigo like symptoms  Risk factors: no obesity       History reviewed. No pertinent past medical history.  There are no problems to display for this patient.   History reviewed. No pertinent surgical history.   OB History   No obstetric history on file.     No family history on file.  Social History   Tobacco Use   Smoking status: Never   Smokeless tobacco: Never  Substance Use Topics   Alcohol use: Not Currently   Drug use: Never    Home Medications Prior to Admission medications   Not on File    Allergies    Patient has no known allergies.  Review of Systems   Review of Systems  All other systems reviewed and are negative.  Physical Exam Updated Vital Signs BP (!) 150/83 (BP Location: Right Arm)   Pulse 67   Temp 98.2 F (36.8 C) (Oral)   Resp 16   Ht 5' 3.25" (1.607 m)   Wt 78.9 kg   SpO2 100%   BMI 30.58 kg/m   Physical Exam Vitals and nursing note reviewed.  Constitutional:      Appearance: She is well-developed.  HENT:     Head: Normocephalic.     Comments: Tender occipital scalp,      Nose: Nose normal.     Mouth/Throat:     Mouth: Mucous membranes are moist.  Eyes:     Extraocular Movements: Extraocular movements intact.     Pupils: Pupils are equal, round, and reactive to light.  Cardiovascular:     Rate  and Rhythm: Normal rate.  Pulmonary:     Effort: Pulmonary effort is normal.  Abdominal:     General: There is no distension.  Musculoskeletal:        General: Normal range of motion.     Cervical back: Normal range of motion.  Neurological:     General: No focal deficit present.     Mental Status: She is alert and oriented to person, place, and time.  Psychiatric:        Mood and Affect: Mood normal.    ED Results / Procedures / Treatments   Labs (all labs ordered are listed, but only abnormal results are displayed) Labs Reviewed - No data to display  EKG None  Radiology CT HEAD WO CONTRAST ( )  Result Date: 07/06/2021 CLINICAL DATA:  Blunt trauma post fall EXAM: CT HEAD WITHOUT CONTRAST TECHNIQUE: Contiguous axial images were obtained from the base of the skull through the vertex without intravenous contrast. COMPARISON:  None. FINDINGS: Brain: No evidence of acute infarction, hemorrhage, hydrocephalus, extra-axial collection or mass lesion/mass effect. Vascular: No hyperdense vessel or unexpected calcification. Skull: Normal. Negative for fracture or focal lesion. Sinuses/Orbits: No acute finding.  Hypoplastic frontal  sinuses. Other: None IMPRESSION: Negative Electronically Signed   By: Corlis Leak M.D.   On: 07/06/2021 15:23    Procedures Procedures   Medications Ordered in ED Medications - No data to display  ED Course  I have reviewed the triage vital signs and the nursing notes.  Pertinent labs & imaging results that were available during my care of the patient were reviewed by me and considered in my medical decision making (see chart for details).    MDM Rules/Calculators/A&P                           MDM:  ct obtained  Pt counseled on results.  Pt may have a mild concussion.  Pt counseled on symptom management and follow up  Final Clinical Impression(s) / ED Diagnoses Final diagnoses:  Concussion without loss of consciousness, initial encounter    Rx / DC  Orders ED Discharge Orders     None     An After Visit Summary was printed and given to the patient.    Elson Areas, [place] 07/07/21 [phone]

## 2021-07-10 ENCOUNTER — Encounter (INDEPENDENT_AMBULATORY_CARE_PROVIDER_SITE_OTHER): Payer: Self-pay

## 2021-07-11 ENCOUNTER — Encounter (INDEPENDENT_AMBULATORY_CARE_PROVIDER_SITE_OTHER): Payer: Self-pay

## 2021-07-16 ENCOUNTER — Inpatient Hospital Stay
Admission: AD | Admit: 2021-07-16 | Discharge: 2021-07-17 | DRG: 346 | Disposition: A | Discharge status: Home / Self Care | Payer: Medicare Other | Source: Ambulatory Visit | Attending: Surgery | Admitting: Surgery

## 2021-07-16 ENCOUNTER — Emergency Department: Payer: Medicare Other

## 2021-07-16 ENCOUNTER — Inpatient Hospital Stay: Payer: Medicare Other | Admitting: Certified Registered"

## 2021-07-16 ENCOUNTER — Encounter
Admission: AD | Disposition: A | Discharge status: Home / Self Care | Payer: Self-pay | Source: Ambulatory Visit | Attending: Surgery

## 2021-07-16 ENCOUNTER — Emergency Department
Admission: EM | Admit: 2021-07-16 | Discharge: 2021-07-16 | Disposition: A | Discharge status: Other Acute Inpatient Hospital | Payer: Medicare Other | Attending: Emergency Medicine | Admitting: Emergency Medicine

## 2021-07-16 DIAGNOSIS — K219 Gastro-esophageal reflux disease without esophagitis: Secondary | ICD-10-CM | POA: Diagnosis present

## 2021-07-16 DIAGNOSIS — Z9884 Bariatric surgery status: Secondary | ICD-10-CM | POA: Insufficient documentation

## 2021-07-16 DIAGNOSIS — K56609 Unspecified intestinal obstruction, unspecified as to partial versus complete obstruction: Principal | ICD-10-CM | POA: Diagnosis present

## 2021-07-16 DIAGNOSIS — Z20822 Contact with and (suspected) exposure to covid-19: Secondary | ICD-10-CM | POA: Insufficient documentation

## 2021-07-16 DIAGNOSIS — Z7982 Long term (current) use of aspirin: Secondary | ICD-10-CM

## 2021-07-16 DIAGNOSIS — Z9049 Acquired absence of other specified parts of digestive tract: Secondary | ICD-10-CM

## 2021-07-16 DIAGNOSIS — Z79899 Other long term (current) drug therapy: Secondary | ICD-10-CM

## 2021-07-16 DIAGNOSIS — K565 Intestinal adhesions [bands], unspecified as to partial versus complete obstruction: Secondary | ICD-10-CM | POA: Insufficient documentation

## 2021-07-16 DIAGNOSIS — I10 Essential (primary) hypertension: Secondary | ICD-10-CM | POA: Diagnosis present

## 2021-07-16 DIAGNOSIS — R110 Nausea: Secondary | ICD-10-CM | POA: Insufficient documentation

## 2021-07-16 DIAGNOSIS — G8929 Other chronic pain: Secondary | ICD-10-CM | POA: Diagnosis present

## 2021-07-16 HISTORY — PX: LAPAROSCOPY, DIAGNOSTIC: SHX4584

## 2021-07-16 LAB — HEPATIC FUNCTION PANEL
ALT: 10 U/L (ref 0–55)
AST (SGOT): 16 U/L (ref 5–41)
Albumin/Globulin Ratio: 1.3 (ref 0.9–2.2)
Albumin: 4 g/dL (ref 3.5–5.0)
Alkaline Phosphatase: 108 U/L (ref 37–117)
Bilirubin Direct: 0.2 mg/dL (ref 0.0–0.5)
Bilirubin Indirect: 0.3 mg/dL (ref 0.2–1.0)
Bilirubin, Total: 0.5 mg/dL (ref 0.2–1.2)
Globulin: 3.2 g/dL (ref 2.0–3.6)
Protein, Total: 7.2 g/dL (ref 6.0–8.3)

## 2021-07-16 LAB — URINALYSIS, REFLEX TO MICROSCOPIC EXAM IF INDICATED
Bilirubin, UA: NEGATIVE
Glucose, UA: NEGATIVE
Leukocyte Esterase, UA: NEGATIVE
Nitrite, UA: NEGATIVE
Protein, UR: NEGATIVE
RBC, UA: 0 /hpf (ref 0–5)
Specific Gravity UA: 1.01 (ref 1.001–1.035)
Urine pH: 6 (ref 5.0–8.0)
Urobilinogen, UA: 0.2 mg/dL (ref 0.2–2.0)
WBC, UA: NONE SEEN /hpf (ref 0–5)

## 2021-07-16 LAB — BASIC METABOLIC PANEL
Anion Gap: 12 (ref 5.0–15.0)
BUN: 9 mg/dL (ref 7.0–21.0)
CO2: 25 mEq/L (ref 17–29)
Calcium: 9.4 mg/dL (ref 8.5–10.5)
Chloride: 106 mEq/L (ref 99–111)
Creatinine: 0.7 mg/dL (ref 0.4–1.0)
Glucose: 110 mg/dL — ABNORMAL HIGH (ref 70–100)
Potassium: 4 mEq/L (ref 3.5–5.3)
Sodium: 143 mEq/L (ref 135–145)

## 2021-07-16 LAB — CBC AND DIFFERENTIAL
Absolute NRBC: 0 10*3/uL (ref 0.00–0.00)
Basophils Absolute Automated: 0.03 10*3/uL (ref 0.00–0.08)
Basophils Automated: 0.5 %
Eosinophils Absolute Automated: 0.07 10*3/uL (ref 0.00–0.44)
Eosinophils Automated: 1.1 %
Hematocrit: 42.8 % (ref 34.7–43.7)
Hgb: 13.6 g/dL (ref 11.4–14.8)
Immature Granulocytes Absolute: 0.01 10*3/uL (ref 0.00–0.07)
Immature Granulocytes: 0.2 %
Lymphocytes Absolute Automated: 1.29 10*3/uL (ref 0.42–3.22)
Lymphocytes Automated: 20.1 %
MCH: 27.6 pg (ref 25.1–33.5)
MCHC: 31.8 g/dL (ref 31.5–35.8)
MCV: 86.8 fL (ref 78.0–96.0)
MPV: 9.4 fL (ref 8.9–12.5)
Monocytes Absolute Automated: 0.37 10*3/uL (ref 0.21–0.85)
Monocytes: 5.8 %
Neutrophils Absolute: 4.66 10*3/uL (ref 1.10–6.33)
Neutrophils: 72.3 %
Nucleated RBC: 0 /100 WBC (ref 0.0–0.0)
Platelets: 294 10*3/uL (ref 142–346)
RBC: 4.93 10*6/uL (ref 3.90–5.10)
RDW: 15 % (ref 11–15)
WBC: 6.43 10*3/uL (ref 3.10–9.50)

## 2021-07-16 LAB — COVID-19 (SARS-COV-2): SARS CoV-2: NEGATIVE

## 2021-07-16 LAB — LIPASE: Lipase: 8 U/L (ref 8–78)

## 2021-07-16 LAB — GFR: EGFR: >60

## 2021-07-16 SURGERY — LAPAROSCOPY, DIAGNOSTIC
Anesthesia: Anesthesia General | Site: Abdomen | Wound class: Clean

## 2021-07-16 MED ORDER — LACTATED RINGERS IV SOLN
INTRAVENOUS | Status: DC | PRN
Start: 2021-07-16 — End: 2021-07-16

## 2021-07-16 MED ORDER — DEXAMETHASONE SODIUM PHOSPHATE 4 MG/ML IJ SOLN (WRAP)
INTRAMUSCULAR | Status: DC | PRN
Start: 2021-07-16 — End: 2021-07-16
  Administered 2021-07-16: 4 mg via INTRAVENOUS

## 2021-07-16 MED ORDER — BUPIVACAINE LIPOSOME 1.3 % IJ SUSP
INTRAMUSCULAR | Status: DC | PRN
Start: 2021-07-16 — End: 2021-07-16
  Administered 2021-07-16: 10 mL

## 2021-07-16 MED ORDER — ONDANSETRON HCL 4 MG/2ML IJ SOLN
INTRAMUSCULAR | Status: AC
Start: 2021-07-16 — End: ?
  Filled 2021-07-16: qty 2

## 2021-07-16 MED ORDER — ROCURONIUM BROMIDE 50 MG/5ML IV SOLN
INTRAVENOUS | Status: AC
Start: 2021-07-16 — End: ?
  Filled 2021-07-16: qty 5

## 2021-07-16 MED ORDER — ROCURONIUM BROMIDE 10 MG/ML IV SOLN (WRAP)
INTRAVENOUS | Status: DC | PRN
Start: 2021-07-16 — End: 2021-07-16
  Administered 2021-07-16: 5 mg via INTRAVENOUS
  Administered 2021-07-16: 15 mg via INTRAVENOUS

## 2021-07-16 MED ORDER — CLINDAMYCIN PHOSPHATE IN D5W 600 MG/50ML IV SOLN
600.0000 mg | INTRAVENOUS | Status: DC
Start: 2021-07-16 — End: 2021-07-17
  Filled 2021-07-16: qty 50

## 2021-07-16 MED ORDER — MIDAZOLAM HCL 1 MG/ML IJ SOLN (WRAP)
INTRAMUSCULAR | Status: DC | PRN
Start: 2021-07-16 — End: 2021-07-16
  Administered 2021-07-16: 2 mg via INTRAVENOUS

## 2021-07-16 MED ORDER — MORPHINE SULFATE 4 MG/ML IJ/IV SOLN (WRAP)
4.0000 mg | Freq: Once | Status: AC
Start: 2021-07-16 — End: 2021-07-16
  Administered 2021-07-16: 01:00:00 4 mg via INTRAVENOUS
  Filled 2021-07-16: qty 1

## 2021-07-16 MED ORDER — PROMETHAZINE HCL 25 MG/ML IJ SOLN
6.2500 mg | Freq: Once | INTRAMUSCULAR | Status: DC | PRN
Start: 2021-07-16 — End: 2021-07-16

## 2021-07-16 MED ORDER — DEXAMETHASONE SODIUM PHOSPHATE 4 MG/ML IJ SOLN
INTRAMUSCULAR | Status: AC
Start: 2021-07-16 — End: ?
  Filled 2021-07-16: qty 1

## 2021-07-16 MED ORDER — DEXTROSE 10 % IV BOLUS
25.0000 g | INTRAVENOUS | Status: DC | PRN
Start: 2021-07-16 — End: 2021-07-17

## 2021-07-16 MED ORDER — PANTOPRAZOLE SODIUM 40 MG IV SOLR
40.0000 mg | Freq: Every day | INTRAVENOUS | Status: DC
Start: 2021-07-17 — End: 2021-07-17
  Administered 2021-07-17: 10:00:00 40 mg via INTRAVENOUS
  Filled 2021-07-16: qty 40

## 2021-07-16 MED ORDER — PROPOFOL 10 MG/ML IV EMUL (WRAP)
INTRAVENOUS | Status: AC
Start: 2021-07-16 — End: ?
  Filled 2021-07-16: qty 20

## 2021-07-16 MED ORDER — ENOXAPARIN SODIUM 40 MG/0.4ML IJ SOSY
40.0000 mg | PREFILLED_SYRINGE | Freq: Every day | INTRAMUSCULAR | Status: DC
Start: 2021-07-16 — End: 2021-07-16

## 2021-07-16 MED ORDER — MELATONIN 3 MG PO TABS
3.0000 mg | ORAL_TABLET | Freq: Every evening | ORAL | Status: DC | PRN
Start: 2021-07-16 — End: 2021-07-17

## 2021-07-16 MED ORDER — SODIUM CHLORIDE 0.9% BAG (IRRIGATION USE)
INTRAVENOUS | Status: DC | PRN
Start: 2021-07-16 — End: 2021-07-16
  Administered 2021-07-16: 1000 mL

## 2021-07-16 MED ORDER — NALOXONE HCL 0.4 MG/ML IJ SOLN (WRAP)
0.2000 mg | INTRAMUSCULAR | Status: DC | PRN
Start: 2021-07-16 — End: 2021-07-16

## 2021-07-16 MED ORDER — GLYCOPYRROLATE 0.2 MG/ML IJ SOLN (WRAP)
INTRAMUSCULAR | Status: DC | PRN
Start: 2021-07-16 — End: 2021-07-16
  Administered 2021-07-16: .4 mg via INTRAVENOUS

## 2021-07-16 MED ORDER — AMMONIA AROMATIC IN INHA
1.0000 | Freq: Once | RESPIRATORY_TRACT | Status: DC | PRN
Start: 2021-07-16 — End: 2021-07-16

## 2021-07-16 MED ORDER — HYDROMORPHONE HCL 0.5 MG/0.5 ML IJ SOLN
0.5000 mg | INTRAMUSCULAR | Status: DC | PRN
Start: 2021-07-16 — End: 2021-07-16

## 2021-07-16 MED ORDER — MORPHINE SULFATE 4 MG/ML IJ/IV SOLN (WRAP)
4.0000 mg | Freq: Once | Status: AC
Start: 2021-07-16 — End: 2021-07-16
  Administered 2021-07-16: 09:00:00 4 mg via INTRAVENOUS
  Filled 2021-07-16: qty 1

## 2021-07-16 MED ORDER — ENOXAPARIN SODIUM 30 MG/0.3ML IJ SOSY
30.0000 mg | PREFILLED_SYRINGE | INTRAMUSCULAR | Status: DC
Start: 2021-07-17 — End: 2021-07-17
  Administered 2021-07-17: 10:00:00 30 mg via SUBCUTANEOUS
  Filled 2021-07-16: qty 0.3

## 2021-07-16 MED ORDER — HYDROMORPHONE HCL 1 MG/ML IJ SOLN
INTRAMUSCULAR | Status: DC | PRN
Start: 2021-07-16 — End: 2021-07-16
  Administered 2021-07-16 (×2): .5 mg via INTRAVENOUS

## 2021-07-16 MED ORDER — NEOSTIGMINE METHYLSULFATE 1 MG/ML IJ/IV SOLN (WRAP)
Status: DC | PRN
Start: 2021-07-16 — End: 2021-07-16
  Administered 2021-07-16: 3 mg via INTRAVENOUS

## 2021-07-16 MED ORDER — PROPOFOL 10 MG/ML IV EMUL (WRAP)
INTRAVENOUS | Status: DC | PRN
Start: 2021-07-16 — End: 2021-07-16
  Administered 2021-07-16: 200 mg via INTRAVENOUS

## 2021-07-16 MED ORDER — ACETAMINOPHEN 10 MG/ML IV SOLN
1000.0000 mg | Freq: Four times a day (QID) | INTRAVENOUS | Status: AC
Start: 2021-07-16 — End: 2021-07-17
  Administered 2021-07-16 – 2021-07-17 (×2): 1000 mg via INTRAVENOUS
  Filled 2021-07-16 (×2): qty 100

## 2021-07-16 MED ORDER — METOPROLOL TARTRATE 5 MG/5ML IV SOLN
5.0000 mg | Freq: Four times a day (QID) | INTRAVENOUS | Status: DC
Start: 2021-07-16 — End: 2021-07-17
  Administered 2021-07-16 – 2021-07-17 (×3): 5 mg via INTRAVENOUS
  Filled 2021-07-16 (×4): qty 5

## 2021-07-16 MED ORDER — NALOXONE HCL 0.4 MG/ML IJ SOLN (WRAP)
0.2000 mg | INTRAMUSCULAR | Status: DC | PRN
Start: 2021-07-16 — End: 2021-07-17

## 2021-07-16 MED ORDER — KCL IN DEXTROSE-NACL 20-5-0.45 MEQ/L-%-% IV SOLN
INTRAVENOUS | Status: DC
Start: 2021-07-16 — End: 2021-07-17

## 2021-07-16 MED ORDER — MORPHINE SULFATE 4 MG/ML IJ/IV SOLN (WRAP)
4.0000 mg | Status: DC | PRN
Start: 2021-07-16 — End: 2021-07-17
  Administered 2021-07-16 – 2021-07-17 (×3): 4 mg via INTRAVENOUS
  Filled 2021-07-16 (×3): qty 1

## 2021-07-16 MED ORDER — BUPIVACAINE LIPOSOME 1.3 % IJ SUSP
INTRAMUSCULAR | Status: AC
Start: 2021-07-16 — End: ?
  Filled 2021-07-16: qty 10

## 2021-07-16 MED ORDER — SODIUM CHLORIDE 0.9 % IV BOLUS
1000.0000 mL | Freq: Once | INTRAVENOUS | Status: AC
Start: 2021-07-16 — End: 2021-07-16
  Administered 2021-07-16: 01:00:00 1000 mL via INTRAVENOUS

## 2021-07-16 MED ORDER — HYDROMORPHONE HCL 1 MG/ML IJ SOLN
INTRAMUSCULAR | Status: AC
Start: 2021-07-16 — End: ?
  Filled 2021-07-16: qty 1

## 2021-07-16 MED ORDER — KETOROLAC TROMETHAMINE 30 MG/ML IJ SOLN
15.0000 mg | Freq: Once | INTRAMUSCULAR | Status: AC
Start: 2021-07-16 — End: 2021-07-16
  Administered 2021-07-16: 01:00:00 15 mg via INTRAVENOUS
  Filled 2021-07-16: qty 1

## 2021-07-16 MED ORDER — MIDAZOLAM HCL 1 MG/ML IJ SOLN (WRAP)
INTRAMUSCULAR | Status: AC
Start: 2021-07-16 — End: ?
  Filled 2021-07-16: qty 2

## 2021-07-16 MED ORDER — GLUCAGON 1 MG IJ SOLR (WRAP)
1.0000 mg | INTRAMUSCULAR | Status: DC | PRN
Start: 2021-07-16 — End: 2021-07-17

## 2021-07-16 MED ORDER — ONDANSETRON HCL 4 MG/2ML IJ SOLN
4.0000 mg | Freq: Once | INTRAMUSCULAR | Status: AC
Start: 2021-07-16 — End: 2021-07-16
  Administered 2021-07-16: 01:00:00 4 mg via INTRAVENOUS
  Filled 2021-07-16: qty 2

## 2021-07-16 MED ORDER — ONDANSETRON HCL 4 MG/2ML IJ SOLN
INTRAMUSCULAR | Status: DC | PRN
Start: 2021-07-16 — End: 2021-07-16
  Administered 2021-07-16: 4 mg via INTRAVENOUS

## 2021-07-16 MED ORDER — OXYCODONE-ACETAMINOPHEN 5-325 MG PO TABS
1.0000 | ORAL_TABLET | Freq: Once | ORAL | Status: DC | PRN
Start: 2021-07-16 — End: 2021-07-16

## 2021-07-16 MED ORDER — MORPHINE SULFATE 4 MG/ML IJ/IV SOLN (WRAP)
4.0000 mg | Freq: Once | Status: AC
Start: 2021-07-16 — End: 2021-07-16
  Administered 2021-07-16: 03:00:00 4 mg via INTRAVENOUS
  Filled 2021-07-16: qty 1

## 2021-07-16 MED ORDER — SUCCINYLCHOLINE CHLORIDE 20 MG/ML IJ SOLN
INTRAMUSCULAR | Status: DC | PRN
Start: 2021-07-16 — End: 2021-07-16
  Administered 2021-07-16: 140 mg via INTRAVENOUS

## 2021-07-16 MED ORDER — LIDOCAINE HCL 2 % IJ SOLN
INTRAMUSCULAR | Status: DC | PRN
Start: 2021-07-16 — End: 2021-07-16
  Administered 2021-07-16: 60 mg

## 2021-07-16 MED ORDER — LIDOCAINE VISCOUS HCL 2 % MT SOLN
10.0000 mL | Freq: Once | OROMUCOSAL | Status: AC
Start: 2021-07-16 — End: 2021-07-16
  Administered 2021-07-16: 04:00:00 10 mL via OROMUCOSAL
  Filled 2021-07-16: qty 15

## 2021-07-16 MED ORDER — BUPIVACAINE HCL (PF) 0.5 % IJ SOLN
INTRAMUSCULAR | Status: AC
Start: 2021-07-16 — End: ?
  Filled 2021-07-16: qty 30

## 2021-07-16 MED ORDER — BUPIVACAINE HCL 0.5 % IJ SOLN
INTRAMUSCULAR | Status: DC | PRN
Start: 2021-07-16 — End: 2021-07-16
  Administered 2021-07-16: 10 mL

## 2021-07-16 MED ORDER — ONDANSETRON HCL 4 MG/2ML IJ SOLN
4.0000 mg | Freq: Four times a day (QID) | INTRAMUSCULAR | Status: DC | PRN
Start: 2021-07-16 — End: 2021-07-17

## 2021-07-16 MED ORDER — DEXTROSE 5% IV BOLUS
250.0000 mL | INTRAVENOUS | Status: DC | PRN
Start: 2021-07-16 — End: 2021-07-17

## 2021-07-16 MED ORDER — DEXTROSE 50 % IV SOLN
25.0000 g | INTRAVENOUS | Status: DC | PRN
Start: 2021-07-16 — End: 2021-07-17

## 2021-07-16 MED ORDER — EPHEDRINE SULFATE 50 MG/ML IJ/IV SOLN (WRAP)
Status: AC
Start: 2021-07-16 — End: ?
  Filled 2021-07-16: qty 1

## 2021-07-16 MED ORDER — ONDANSETRON HCL 4 MG/2ML IJ SOLN
4.0000 mg | Freq: Once | INTRAMUSCULAR | Status: DC | PRN
Start: 2021-07-16 — End: 2021-07-16

## 2021-07-16 MED ORDER — FENTANYL CITRATE (PF) 50 MCG/ML IJ SOLN (WRAP)
INTRAMUSCULAR | Status: DC | PRN
Start: 2021-07-16 — End: 2021-07-16
  Administered 2021-07-16: 50 ug via INTRAVENOUS

## 2021-07-16 MED ORDER — SUCCINYLCHOLINE CHLORIDE 20 MG/ML IJ SOLN
INTRAMUSCULAR | Status: AC
Start: 2021-07-16 — End: ?
  Filled 2021-07-16: qty 10

## 2021-07-16 MED ORDER — FENTANYL CITRATE (PF) 50 MCG/ML IJ SOLN (WRAP)
50.0000 ug | INTRAMUSCULAR | Status: DC | PRN
Start: 2021-07-16 — End: 2021-07-16
  Administered 2021-07-16: 50 ug via INTRAVENOUS
  Filled 2021-07-16: qty 2

## 2021-07-16 MED ORDER — FENTANYL CITRATE (PF) 50 MCG/ML IJ SOLN (WRAP)
INTRAMUSCULAR | Status: AC
Start: 2021-07-16 — End: ?
  Filled 2021-07-16: qty 2

## 2021-07-16 SURGICAL SUPPLY — 102 items
ADHESIVE SKIN CLOSURE DERMABOND ADVANCED (Skin Closure) ×1 IMPLANT
ADHESIVE SKIN CLOSURE DERMABOND ADVANCED .7 ML LIQUID APPLICATOR (Skin Closure) ×1 IMPLANT
ADHESIVE SKNCLS 2 OCTYL CYNCRLT .7ML (Skin Closure) ×1
BLADE SRGCLPR LF STRL PVT ADJ HD DISP (Blade)
BLADE SURGICAL CLIPPER PIVOT ADJUSTABLE (Blade) IMPLANT
BLADE SURGICAL CLIPPER PIVOT ADJUSTABLE HEAD 9661 PURPLE (Blade) IMPLANT
CLIP SUT PDS LPR TY VCL LF STRL ABS (Suture)
CLIP SUTURE ABSORBABLE LAPRA-TY VICRYL (Suture) IMPLANT
CLIP SUTURE ABSORBABLE LAPRA-TY VICRYL POLYDIOXANONE (Suture) IMPLANT
CLIPPER SURGICAL PREP VACUUM SUCTION (Tubing) IMPLANT
CLIPPER SURGICAL PREP VACUUM SUCTION PORT ADJUSTABLE STRAP CLIPVAC (Tubing) IMPLANT
COVER FLEXIBLE LIGHT HANDLE PLASTIC GREEN (Procedure Accessories) ×1 IMPLANT
COVER FLEXIBLE MEDLINE LIGHT HANDLE (Procedure Accessories) ×1 IMPLANT
COVER LGHT HNDL PLS LF STRL FLXB DISP (Procedure Accessories) ×1
DEVICE SUT E STCH 10MM 15IN LF STRL (Laparoscopy Supplies)
DEVICE SUTURING L15 IN PUSH BUTTON GRIP (Laparoscopy Supplies) IMPLANT
DEVICE SUTURING L15 IN PUSH BUTTON GRP HNDL 10MM ENDO STTCH ENDOSCOPIC (Laparoscopy Supplies) IMPLANT
DISSECTOR LAPAROSCOPIC L39 CM CURVE JAW (Procedure Accessories) IMPLANT
DISSECTOR LAPAROSCOPIC L39 CM CURVE JAW ULTRASONIC CORDLESS SONICISION (Procedure Accessories) IMPLANT
DISSECTOR LAPSCP SONICISION 39CM CRV JAW (Procedure Accessories)
ELECTRODE ELECTROSURGICAL L HOOK L36 CM (Procedure Accessories) IMPLANT
ELECTRODE ELECTROSURGICAL L HOOK L36 CM CLEANCOAT PTFE COATED (Procedure Accessories) IMPLANT
ELECTRODE ESURG PTFE LHK CLEANCOAT 36CM (Procedure Accessories)
GLOVE SRG 7.5 BGL SRG LTX STRL PF BEAD (Glove) ×1
GLOVE SRG NTR RBR 8 BGL SRG LTX STRL PF (Glove) ×1
GLOVE SRG PLISPRN 8 BGL PI INDCTR (Glove) ×1
GLOVE SURGICAL 7 1/2 BIOGEL SURGEONS (Glove) ×1 IMPLANT
GLOVE SURGICAL 7 1/2 BIOGEL SURGEONS POWDER FREE BEAD CUFF TEXTURE (Glove) ×1 IMPLANT
GLOVE SURGICAL 8 BIOGEL PI INDICATOR (Glove) ×1 IMPLANT
GLOVE SURGICAL 8 BIOGEL PI INDICATOR UNDERGLOVE POWDER FREE SMOOTH (Glove) ×1 IMPLANT
GLOVE SURGICAL 8 BIOGEL SURGEONS POWDER (Glove) ×1 IMPLANT
GLOVE SURGICAL 8 BIOGEL SURGEONS POWDER FREE BEAD CUFF TEXTURE SURFACE (Glove) ×1 IMPLANT
GOWN SRG XL SMARTGOWN LF STRL LVL 4 (Gown)
GOWN SURGICAL XL SMARTGOWN LEVEL 4 (Gown) IMPLANT
GOWN SURGICAL XL SMARTGOWN LEVEL 4 BREATHABLE (Gown) IMPLANT
HANDLE LGHT ASPN LF STRL SNPON (Procedure Accessories) ×1
HANDLE LIGHT SNAP ON ASPEN (Procedure Accessories) ×1 IMPLANT
HOLDER ENDOSCOPIC INSTRUMENT TUBE SET (Endoscopic Supplies) IMPLANT
HOLDER ENDOSCOPIC INSTRUMENT TUBE SET ACTIVATION BUTTON WINGMAN (Endoscopic Supplies) IMPLANT
IRRIGATOR SUCTION ERGONOMIC HAND PIECE STRYKEFLOW II (Suction) IMPLANT
IRRIGATOR SUCTION STRYKEFLOW 2 (Suction) IMPLANT
KIT INFECTION CONTROL CUSTOM (Kits) ×2 IMPLANT
KIT INFECTION CONTROL CUSTOM IFOH03 (Kits) ×1 IMPLANT
MAT PATIENT L81 IN X W39 IN M2 (Positioning Supplies) ×1 IMPLANT
MAT PATIENT L81 IN X W39 IN M2 MICROCLIMATE BODY PAD PREVALON MOBILE (Positioning Supplies) ×1 IMPLANT
MAT PT PREVALON 81X39IN M2 MICROCLIMATE (Positioning Supplies) ×1
NEEDLE HPO PP RW PRCSNGL 20GA 1.5IN LF (Needles) IMPLANT
PASSER SUT WECK EFX CLS (Procedure Accessories) ×1
RELOAD SUT AS LACTOMER 0 ES-9 E STCH (Laparoscopy Supplies)
RELOAD SUTURE ASSISTANT L48 IN 0 ES-9 (Laparoscopy Supplies) IMPLANT
RELOAD SUTURE ASSISTANT L48 IN 0 ES-9 ENDO STTCH SRGDC LACTOMER GRN (Laparoscopy Supplies) IMPLANT
SEALER/DIVIDER LAPAROSCOPIC L44 CM 350 D (Laparoscopy Supplies) IMPLANT
SEALER/DIVIDER LAPAROSCOPIC L44 CM 350 D L18.5 MM MARYLAND CURVE JAW (Laparoscopy Supplies) IMPLANT
SEALER/DIVIDER LAPSCP 350D 18.5MM LGSR (Laparoscopy Supplies)
SET HIGH FLOW SMOKE EVACUATION (Tubing) ×1 IMPLANT
SET HIGH FLOW SMOKE EVACUATION PNEUMOCLEAR TUBING (Tubing) ×1 IMPLANT
SET TUBE W/BTN F/SCOPE HLDR (Endoscopic Supplies)
SET TUBING PNEUMOCLEAR HFLO SMK EVAC (Tubing) ×1
SLEEVE LAPSCP UNV EPTH XCL 5MM 100MM (Procedure Accessories) ×2 IMPLANT
SLEEVE LAPSCP UNV EPTH XCL 5MM 100MM LF (Procedure Accessories) ×2
SLEEVE TROCAR L100 MM UNIVERSAL STABILITY OD5 MM ENDOPATH XCEL (Procedure Accessories) ×2 IMPLANT
SOLUTION IV LACTATED RINGERS 1000 ML (IV Solutions) IMPLANT
SOLUTION IV LACTATED RINGERS 1000 ML PLASTIC CONTAINER (IV Solutions) IMPLANT
SOLUTION IV LR 1000ML VFLX LF PLS CNTNR (IV Solutions)
STAPLER EXTRA THICK TISSUE L26 CM X W4 (Procedure Accessories) IMPLANT
STAPLER EXTRA THICK TISSUE L26 CM X W4 MM OD12 MM ENDOSCOPIC (Procedure Accessories) IMPLANT
STAPLER INTNL TI XL CIRC EEA 25MMX3.5MM (Staplers)
STAPLER INTNL TI XL DST EEA ORVIL 25MM (Staplers)
STAPLER INTNL TI XL UNV EGIA ULT 12MM 26 (Procedure Accessories)
STAPLER L25 MM X H3.5 MM ENDOSCOPIC (Staplers) IMPLANT
STAPLER L25 MM X H3.5 MM ENDOSCOPIC LINEAR CUTTER SQUEEZE HANDLE LARGE (Staplers) IMPLANT
STAPLER OD25 MM ENDOSCOPIC LINEAR CUTTER (Staplers) IMPLANT
STAPLER OD25 MM ENDOSCOPIC LINEAR CUTTER SQUEEZE HANDLE LARGE (Staplers) IMPLANT
STAPLER POWER POWER CONTROL SHELL SIGNIA (Staplers) IMPLANT
STAPLER PWR SIGNIA PWR CNTRL SHL (Staplers)
SUTURE ABS 0 VCL 54IN BRD TIE COAT VIOL (Suture)
SUTURE ABS 2-0 SH VCL 27IN BRD COAT VIOL (Suture)
SUTURE ABS 4-0 PS2 MNCRL MTPS 18IN MFL (Suture) ×1
SUTURE COATED VICRYL 0 L54 IN BRAID TIES (Suture) IMPLANT
SUTURE COATED VICRYL 2-0 SH L27 IN BRAID (Suture) IMPLANT
SUTURE COATED VICRYL 2-0 SH L27 IN BRAID COATED VIOLET ABSORBABLE (Suture) IMPLANT
SUTURE MONOCRYL 4-0 PS-2 L18 IN (Suture) ×1 IMPLANT
SUTURE MONOCRYL 4-0 PS-2 L18 IN MONOFILAMENT UNDYED ABSORBABLE (Suture) ×1 IMPLANT
SUTURE PASSER WECK EFX CLASSIC (Procedure Accessories) ×1 IMPLANT
SYRINGE 30 ML CONCENTRIC TIP GRADUATE (Syringes, Needles) IMPLANT
SYRINGE 30 ML CONCENTRIC TIP GRADUATE NONPYROGENIC DEHP FREE LOK (Syringes, Needles) IMPLANT
SYRINGE MED 30ML LL LF STRL CONC TIP (Syringes, Needles)
SYSTEM IMAGING 8X6IN CLEARIFY MICROFIBER WARM HUB TRCR WIPE DSPSBL (Kits) ×1 IMPLANT
SYSTEM IMG MRFBR CLEARIFY 8X6IN WRM HUB (Kits) ×2 IMPLANT
TIP SCISSORS ENDOCUT L19.3 MM (Instrument) IMPLANT
TIP SCSR ENDCT 19.3MM DISP (Instrument)
TROCAR LAPAROSCOPIC BALLOON BLUNT TIP (Laparoscopy Supplies) IMPLANT
TROCAR LAPAROSCOPIC BALLOON BLUNT TIP L100 MM OD12 MM KII ABDOMINAL (Laparoscopy Supplies) IMPLANT
TROCAR LAPAROSCOPIC BLADELESS STABILITY SLEEVE L100 MM OD12 MM (Laparoscopy Supplies) IMPLANT
TROCAR LAPAROSCOPIC STABILITY SLEEVE (Laparoscopy Supplies)
TROCAR LAPAROSCOPIC STABILITY SLVE (Laparoscopy Supplies) ×1
TROCAR LAPSCP EPTH XCL 12MM 100MM LF (Laparoscopy Supplies) IMPLANT
TROCAR LAPSCP EPTH XCL 5MM 100MM LF STRL (Laparoscopy Supplies) ×2 IMPLANT
TROCAR LAPSCP KII 12MM 100MM LF STRL BLN (Laparoscopy Supplies)
TROCAR OD5 MM L100 MM BLADELESS STABLE SLEEVE ENDOPATH XCEL ENDOSCOPIC (Laparoscopy Supplies) ×1 IMPLANT
TUBING CLIPVAC FILTER DISP (Tubing)
TUBING SCT IRR (Suction)

## 2021-07-16 NOTE — Plan of Care (Signed)
Shift Note:      Orientation:A/O x 4  Rhythm on tele: NSR  Oxygen: RA  Ambulation: Walked in room, standby  Pain: Morphine  Lines/Drips: D5, 1/2 NS, K20 @ 100  GI/GU: C x 2  Fall Score: High    Critical Labs/Imaging/Procedures:     Comments:    - see doc flow for complete assessment and vitals.     Significant Shift Events and Questions for Attending:    Plan:   - Vitals Q4H   - Diagnostic surgery today  - NG tube, intermittent low suction  - Sm intestine gastrografin study tomorrow  - Pain control  - BP control/telemetry

## 2021-07-16 NOTE — H&P (Signed)
[facility]      Patient: Amber [name]  Date: 07/16/2021   DOB: Sep 29, 1951  Admission Date: 07/16/2021   MRN: [id number]  Attending: Randall An MD            History Gathered From:  self     HISTORY AND PHYSICAL     Amber [name] is a 70 y.o. 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 Patient transferred here for further management. She reports sudden onset of symptoms yesterday. No fevers, chills, CP, and SOB. She reports she has been taking her home medications and recently needing Oxycodone due to L knee pain.         Past Medical History:   Diagnosis Date    Asthma     Bronchitis 01/02/2019    Chest pain 2016     Chest pain after eating x6  months--being followed by GI for GERD    Claustrophobia     MRIs    Constipation     Genital herpes     no current outbreak (10/03/15)    GERD (gastroesophageal reflux disease)     Hiatal hernia     h/o surgery    Hyperlipidemia     Hypertensive disorder     Well controlled on meds per pt. Elevated with pain.    Low back pain     Nausea without vomiting     OSA on CPAP 2015    CPAP nightly x 1 yr, used nightly.    Osteoarthritis of knees, bilateral     and back    SBO (small bowel obstruction) 09/2018    TIA (transient ischemic attack) 2014    TIA 2014-no residual. Patient evaulated for possible TIA 07/12/2015  @ [facility] (dischage summary in epic)-per summary CT of head was normal and patient was d/c'd       Past Surgical History:   Procedure Laterality Date    APPENDECTOMY (OPEN)  >25 yrs    CHOLECYSTECTOMY      EGD  01/2015    EGD N/A 01/23/2019    Procedure: EGD;  Surgeon: Champ Mungo, MD;  Location: [facility];  Service: General;  Laterality: N/A;  ENDOSCOPY  MD REQ=30MINS; Q1=UNK    EGD, BIOPSY N/A 09/15/2017    Procedure: EGD, BIOPSY;  Surgeon: Pershing Proud, MD;  Location: [facility];  Service: Gastroenterology;  Laterality: N/A;    fallopian tube surgery  >25 yrs    INSERTION, PAIN PUMP (MEDICAL) N/A 10/22/2015    Procedure: INSERTION, PAIN PUMP (MEDICAL);  Surgeon: Nicola Police, DO;  Location: [facility];  Service: General;  Laterality: N/A;    JOINT REPLACEMENT      LAPAROSCOPIC, CHOLECYSTECTOMY, CHOLANGIOGRAM  08/13/2014    LAPAROSCOPIC, GASTRIC BYPASS N/A 10/22/2015    Procedure: LAPAROSCOPIC, GASTRIC BYPASS;  Surgeon: Jeanell Sparrow, Hamid R, DO;  Location: [facility];  Service: General;  Laterality: N/A;    LAPAROSCOPIC, HERNIORRHAPHY, HIATAL N/A 10/22/2015    Procedure: LAPAROSCOPIC, HERNIORRHAPHY, HIATAL;  Surgeon: Nicola Police, DO;  Location: [facility];  Service: General;  Laterality: N/A;    LAPAROSCOPIC, LYSIS, ADHESIONS N/A 09/01/2018    Procedure: LAPAROSCOPIC, LYSIS, ADHESIONS RELEASE OF  SMALL BOWEL OBSTRUCTION;  Surgeon: Champ Mungo, MD;  Location:  [facility];  Service: General;  Laterality: N/A;    LAPAROSCOPY, DIAGNOSTIC N/A 09/01/2018    Procedure: LAPAROSCOPY, DIAGNOSTIC;  Surgeon: Champ Mungo, MD;  Location:  MAIN OR;  Service: General;  Laterality: N/A;    OVARY SURGERY Right >25 yrs    SPINE SURGERY  11/2013    cervical HNP x 2, Dr. Bufford Buttner    TOOTH EXTRACTION N/A        Prior to Admission medications    Medication Sig Start Date End Date Taking? Authorizing Provider   aspirin EC 81 MG EC tablet Take 1 tablet (81 mg total) by mouth daily. 01/25/18  Yes Carmelina Paddock, MD   atenolol (TENORMIN) 50 MG tablet TAKE 1 TABLET(50 MG) BY MOUTH TWICE DAILY 03/10/21  Yes Bui, Flonnie Overman, MD   losartan (COZAAR) 100 MG tablet Take 1 tablet (100 mg total) by mouth daily 06/12/21  Yes Bui, Flonnie Overman, MD   ondansetron (ZOFRAN-ODT) 4 MG disintegrating tablet Take 1 tablet (4 mg total) by mouth every 8 (eight) hours as needed for Nausea 04/28/21  Yes Bui, Flonnie Overman, MD   oxyCODONE-acetaminophen (PERCOCET) 5-325 MG  per tablet Take one to two tablets po bid as needed for pain 06/12/21  Yes Bui, Flonnie Overman, MD   pantoprazole (PROTONIX) 40 MG tablet Take 1 tablet (40 mg total) by mouth daily 01/22/21  Yes Bui, Flonnie Overman, MD   benzonatate (TESSALON) 100 MG capsule Take 1 capsule (100 mg total) by mouth 3 (three) times daily as needed for Cough 04/28/21   Bui, Flonnie Overman, MD   Cyanocobalamin (B-12 IJ) Inject as directed every 30 (thirty) days       [provider]   diazePAM (VALIUM) 5 MG tablet Take 1 tablet (5 mg total) by mouth every 8 (eight) hours as needed for Anxiety 08/15/20   Bui, Flonnie Overman, MD   famotidine (PEPCID) 20 MG tablet Take 1 tablet (20 mg total) by mouth daily 07/16/20   Bui, Flonnie Overman, MD   fluticasone (FLONASE) 50 MCG/ACT nasal spray 2 sprays by Nasal route daily.  Patient taking differently: 2 sprays by Nasal route as needed 01/02/14   Oneita Jolly, MD   lidocaine (XYLOCAINE) 2 % jelly Apply topically as needed (sore) 11/15/20 11/15/21  Oneita Jolly, MD   Multiple Vitamin (MULTIVITAMIN) capsule Take 1 capsule by mouth daily    [provider]   traMADol (ULTRAM) 50 MG tablet 50 mg 01/08/21   [provider]   valACYclovir HCL (VALTREX) 500 MG tablet TAKE 1 TABLET(500 MG) BY MOUTH DAILY 03/08/20   Bui, Flonnie Overman, MD   vitamin D, ergocalciferol, (DRISDOL) 50000 UNIT Cap Take 1 capsule (50,000 Units total) by mouth once a week  Patient not taking: No sig reported 11/15/19   Oneita Jolly, MD       Allergies   Allergen Reactions    Acyclovir Nausea And Vomiting     Other reaction(s): gi distress    Amlodipine      nausea    Amoxicillin-Pot Clavulanate      Other reaction(s): gi distress      Atorvastatin        Palpitation    Carvedilol      Extreme fatigue    Hydralazine      Headache      Lisinopril      nausea    Lovastatin Nausea And Vomiting     Other reaction(s): gi distress  Metronidazole Nausea And Vomiting     Other reaction(s): gi distress    Moxifloxacin Nausea And Vomiting         GI  symptoms    Rosuvastatin      Palpitation, chest pain, abd pain     Statins      Chest pain, palpitations.    Sulfa Antibiotics Nausea And Vomiting     Other reaction(s): gi distress       CODE STATUS: Full Code    PRIMARY CARE MD: Oneita Jolly, MD    Family History   Problem Relation Age of Onset    Cancer Mother 62        breast cancer    Breast cancer Mother     Heart disease Father     Malignant hyperthermia Neg Hx     Pseudochol deficiency Neg Hx        Social History     Tobacco Use    Smoking status: Never    Smokeless tobacco: Never   Vaping Use    Vaping Use: Never used   Substance Use Topics    Alcohol use: Not Currently    Drug use: Never       REVIEW OF SYSTEMS     Ten point review of systems negative or as per HPI and below endorsements.    PHYSICAL EXAM     Vital Signs (most recent): BP 178/83    Pulse 78    Temp 99 F (37.2 C) (Oral)    Resp 14    Ht 1.549 m (5\' 1" )    Wt 63 kg (139 lb)    LMP  (LMP Unknown)    SpO2 100%    BMI 26.26 kg/m   Constitutional:   Patient speaks freely in full sentences. NG tube in place  HEENT: NC/AT, PERRL, no scleral icterus or conjunctival pallor, no nasal discharge, MMM, oropharynx without erythema or exudate  Neck: trachea midline, supple, no cervical or supraclavicular lymphadenopathy or masses  Cardiovascular: RRR, normal S1 S2, no murmurs, gallops, palpable thrills, no JVD, Non-displaced PMI.  Respiratory: Normal rate. No retractions or increased work of breathing. Clear to auscultation and percussion bilaterally.  Gastrointestinal: +BS, soft, no bowel sounds, no ttp  Genitourinary: no suprapubic or costovertebral angle tenderness  Musculoskeletal: ROM and motor strength grossly normal. No clubbing, edema, or cyanosis. DP and radial pulses 2+ and symmetric.  Skin: no rashes, jaundice or other lesions  Neurologic: EOMI, CN 2-12 grossly intact. no gross motor or sensory deficits, patellar and bicep DTR 2+ and symmetric, downward plantar reflexes  Psychiatric:  AAOx3, affect and mood appropriate. The patient is alert, interactive, appropriate.    LABS & IMAGING     Recent Results (from the past 24 hour(s))   Basic Metabolic Panel    Collection Time: 07/16/21 12:58 AM   Result Value Ref Range    Glucose 110 (H) 70 - 100 mg/dL    BUN 9.0 7.0 - 08.6 mg/dL    Creatinine 0.7 0.4 - 1.0 mg/dL    Calcium 9.4 8.5 - 57.8 mg/dL    Sodium 469 629 - 528 mEq/L    Potassium 4.0 3.5 - 5.3 mEq/L    Chloride 106 99 - 111 mEq/L    CO2 25 17 - 29 mEq/L    Anion Gap 12.0 5.0 - 15.0   CBC and differential    Collection Time: 07/16/21 12:58 AM   Result Value Ref Range    WBC  6.43 3.10 - 9.50 x10 3/uL    Hgb 13.6 11.4 - 14.8 g/dL    Hematocrit 16.1 09.6 - 43.7 %    Platelets 294 142 - 346 x10 3/uL    RBC 4.93 3.90 - 5.10 x10 6/uL    MCV 86.8 78.0 - 96.0 fL    MCH 27.6 25.1 - 33.5 pg    MCHC 31.8 31.5 - 35.8 g/dL    RDW 15 11 - 15 %    MPV 9.4 8.9 - 12.5 fL    Neutrophils 72.3 None %    Lymphocytes Automated 20.1 None %    Monocytes 5.8 None %    Eosinophils Automated 1.1 None %    Basophils Automated 0.5 None %    Immature Granulocytes 0.2 None %    Nucleated RBC 0.0 0.0 - 0.0 /100 WBC    Neutrophils Absolute 4.66 1.10 - 6.33 x10 3/uL    Lymphocytes Absolute Automated 1.29 0.42 - 3.22 x10 3/uL    Monocytes Absolute Automated 0.37 0.21 - 0.85 x10 3/uL    Eosinophils Absolute Automated 0.07 0.00 - 0.44 x10 3/uL    Basophils Absolute Automated 0.03 0.00 - 0.08 x10 3/uL    Immature Granulocytes Absolute 0.01 0.00 - 0.07 x10 3/uL    Absolute NRBC 0.00 0.00 - 0.00 x10 3/uL   Lipase    Collection Time: 07/16/21 12:58 AM   Result Value Ref Range    Lipase 8 8 - 78 U/L   Hepatic function panel (LFT)    Collection Time: 07/16/21 12:58 AM   Result Value Ref Range    Bilirubin, Total 0.5 0.2 - 1.2 mg/dL    Bilirubin Direct 0.2 0.0 - 0.5 mg/dL    Bilirubin Indirect 0.3 0.2 - 1.0 mg/dL    AST (SGOT) 16 5 - 41 U/L    ALT 10 0 - 55 U/L    Alkaline Phosphatase 108 37 - 117 U/L    Protein, Total 7.2 6.0 - 8.3 g/dL     Albumin 4.0 3.5 - 5.0 g/dL    Globulin 3.2 2.0 - 3.6 g/dL    Albumin/Globulin Ratio 1.3 0.9 - 2.2   GFR    Collection Time: 07/16/21 12:58 AM   Result Value Ref Range    EGFR >60.0    COVID-19 (SARS-CoV-2) (ID Now)    Collection Time: 07/16/21  2:59 AM    Specimen: Nasopharynx; Nasopharyngeal Swab   Result Value Ref Range    Purpose of COVID testing Diagnostic -PUI     SARS-CoV-2 Specimen Source Nasopharyngeal     SARS CoV-2 Negative    Urinalysis Reflex to Microscopic Exam    Collection Time: 07/16/21  3:25 AM   Result Value Ref Range    Urine Type Clean Catch     Color, UA OTHER Colorless - Yellow    Clarity, UA CLEAR Clear - Hazy    Specific Gravity UA 1.010 1.001 - 1.035    Urine pH 6.0 5.0 - 8.0    Leukocyte Esterase, UA NEGATIVE Negative    Nitrite, UA NEGATIVE Negative    Protein, UR NEGATIVE Negative    Glucose, UA NEGATIVE Negative    Ketones UA TRACE (A) Negative    Urobilinogen, UA 0.2 0.2 - 2.0 mg/dL    Bilirubin, UA NEGATIVE Negative    Blood, UA TRACE-INTACT (A) Negative    RBC, UA 0 -2 0 - 5 /hpf    WBC, UA None Seen 0 - 5 /hpf  Squamous Epithelial Cells, Urine 0 - 5 0 - 25 /hpf         IMAGING (images personally reviewed and concur with radiologist unless otherwise stated below):  FL Sml Intest Sgl Contrast Study    (Results Pending)             EMERGENCY DEPARTMENT COURSE:  Orders Placed This Encounter   Procedures    FL Sml Intest Sgl Contrast Study    Basic Metabolic Panel    CBC and differential    Basic Metabolic Panel    GFR    Diet NPO time specified    Vital signs    Pulse Oximetry    Progressive Mobility Protocol    Notify physician    NSG Communication: Glucose POCT order (PRN hypoglycemia)    I/O    Height    Weight    Skin assessment    Nursing communication: Adult Hypoglycemia Treatment Algorithm    Place sequential compression device    Maintain sequential compression device    Education: Activity    Education: Disease Process & Condition    Education: Pain Management    Education:  Falls Risk    Education: Smoking Cessation    Nasogastric/oralgastric tube maintenance    Telemetry Monitoring Continuous    Nasal Cannula Low-Flow (5 lpm or Less)    Vital signs with pulse oximetry    Vital signs q1 x 4h with pulse ox then per unit protocol    Progressive Mobility Protocol - select form below as needed    Ambulate patient with assistance (Q8H - minimum 3 times per day)    Notify physician    Incentive spirometry nursing (Q1HWA)    Strict intake and output    Tube maintenance (NG/OG low continous Suction)    Education: Cough/Deep-Breathe Exercises    Education: Incentive Spirometry    Education: Wound Care    Full Code    Saline lock IV    ADMIT TO INPATIENT       ASSESSMENT & PLAN     RAAHI KORBER is a 70 y.o. female admitted under  INPATIENT with SBO. I expect an inpatient to remain in the hospital for more than 2 midnights due to need for surgery  I started evaluating the patient at 10 am on 07/16/2021    1. SBO:  NPO, NG tube to lower intermittent suction, Plan for surgery with bariatric team later today, IVF, Morphine IV PRN    2. HTN  Hold orals  Place on telemetry, IV Lopressor q6HR  May allergies to antihypertensives  Consider clonidine patch if BP Not controlled    3. Hx Chronic L Knee Pain  Oxycodone held, on Morphine IV PRN     4. GERD:   IV PPI daily       Patient Lines/Drains/Airways Status       Active PICC Line / CVC Line / PIV Line / Drain / Airway / Intraosseous Line / Epidural Line / ART Line / Line / Wound / Pressure Ulcer / NG/OG Tube       Name Placement date Placement time Site Days    Peripheral IV 07/16/21 Right Antecubital 07/16/21  0000  Antecubital  less than 1    Wound 09/01/18 Surgical Incision Abdomen Other (Comment) 09/01/18  1430  Abdomen  1049    Wound 07/16/21 Surgical Incision Abdomen Other (Comment) Dermabond 07/16/21  1457  Abdomen  less than 1  Sherian Maroon MD  07/16/2021 9:56 PM

## 2021-07-16 NOTE — EDIE (Signed)
COLLECTIVE?NOTIFICATION?07/16/2021 00:15?Amber [name], Amber [name]?MRN: [id number]    Criteria Met      5 ED Visits in 12 Months    Security and Safety  No Security Events were found.  ED Care Guidelines  There are currently no ED Care Guidelines for this patient. Please check your facility's medical records system.            E.D. Visit Count (12 mo.)  Facility Visits   [facility] 3   [facility] [place] 1   [place] [facility]: [facility] 3   Total 7   Note: Visits indicate total known visits.     Recent Emergency Department Visit Summary  Date Facility [facility] Type Diagnoses or Chief Complaint    Jul 16, 2021  [place] [facility]: [facility].  [name]  Emergency      vomiting, flank pain      May 27, 2021  [facility] [place].  [name]  [name]  Emergency      RS/ABDOMINAL PAIN AND DIZZINESS      Syncope and collapse      Jan 23, 2021  [facility] [place].  [name]  Emergency      STAT ULTRASOUND      Presence of left artificial knee joint      Other specified soft tissue disorders      1. Pain in left leg      1. Pain in left lower leg      4. Essential (primary) hypertension      5. Personal history of transient ischemic attack (TIA), and cerebral infarction without residual deficits      6. Allergy status to analgesic agent      7. Sleep apnea, unspecified      8. Bariatric surgery status      Jan 23, 2021  [facility] [name] .  [name]  Emergency      PERSISTENT LEFT LEG PAIN      1. Procedure and treatment not carried out due to patient leaving prior to being seen by health care provider      Jan 20, 2021  [place] [facility]: [facility].  [name]  Emergency      hign BP      Hypertension      Essential (primary) hypertension      Constipation, unspecified      Other acute postprocedural pain      Oct 06, 2020  [place] [facility]: [facility].  [name]  Emergency      SOB, Cough, Sore Throat      Cough       Shortness of Breath      Unspecified asthma with (acute) exacerbation      Personal history of other diseases of the respiratory system      Shortness of breath      Cough, unspecified      Personal history of other diseases of the nervous system and sense organs      Dorsalgia, unspecified      Personal history of other diseases of the circulatory system      Aug 03, 2020  [facility] [place].  [name]    Emergency      HIGH BLOOD PRESSURE      Chest pain, unspecified        Recent Inpatient Visit Summary  Date Facility [facility] Type Diagnoses or Chief Complaint    May 27, 2021  [facility].  [name]  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 Internal Medicine [phone] Current      Collective Portal  This patient has registered at the [facility] Emergency Room: [facility] Emergency Department   For more information visit: [email or web address]     PLEASE NOTE:     1.   Any care recommendations and other clinical information are provided as guidelines or for historical purposes only, and providers should exercise their own clinical judgment when providing care.    2.   You may only use this information for purposes of treatment, payment or health care operations activities, and subject to the limitations of applicable Collective Policies.    3.   You should consult directly with the organization that provided a care guideline or other clinical history with any questions about additional information or accuracy or completeness of information provided.    ? 2022 [place] - [email or web address]

## 2021-07-16 NOTE — ED Notes (Signed)
Pt updated on bed status.  Transfer request sent.  Pt resting comfortably.

## 2021-07-16 NOTE — Discharge Instr - AVS First Page (Addendum)
Drs. Fermin Schwab, Luana Shu, and [place]  [address], [address]   [place], [place] [address]   [phone]   [phone]    [address], [address]   [place], [place] [address]   [phone]   [phone]       PATIENT DISCHARGE INFORMATION: BARIATRIC SURGERY    We are concerned about your health and have developed a few guidelines to help you during your hospitalization and at home. Please call your physician if you have any questions about your care when you get home.    DIET    You may have a Full Liquid diet for about 2 weeks then resume your usual diet    ACTIVITY    Avoid heavy lifting (more than 10 pounds) or strenuous exercise for at least 4 weeks.    Walk daily, gradually increasing speed with a goal of 2 miles.  Ok to go up and down stairs.    MEDICATION    You should refrain from drinking alcohol, you should refrain from driving if you are taking a narcotic medication for pain.    If the pain is unrelieved by the prescribed medication, please call you doctor.      - Take the Tylenol (650 mg or 20 mL) every four hours for pain control for the next 3-5 days and then as needed.  Suggested times are 7:00am, 11:00am, 3:00pm, and 7:00pm.  You can add another dose at 11:00pm if needed before bedtime . Do not exceed 4 grams (4000 mg) of tylenol total in 24 hours. The current dosing will give you 2600 mg- 3250 mg which is below the daily maximum.     - Take the oxycodone (or Dilaudid, whichever you were prescribed) as needed for severe pain.  This is a narcotic and has additive properties if used and used for a prolonged period of time.  You cannot operate heavy machinery (a vehicle) while on narcotics.  Contact your insurance company for official recommendations for when you can drive after discontinuing narcotic medications.     - Use Zofran (ondansetron) as needed for nausea.    - IF you experience constipation:   Drink at least 80 oz fluids daily  Walk regularly    Dulcolax suppository daily x3 days to get things moving   Enemas, as needed, to soften hard stools   If at least 5 days out from surgery, may try Miralax  Once you start having softer stools, add in a fiber supplement daily (ie Metamucil, Benefiber, Citrucel-SF etc)  If you are unable to have a bowel movement and feeling abdominal pain and/or nausea/vomiting, please notify the office    INCISION    Keep your incision clean and dry. You may shower daily and pat your incision dry. No bathing or swimming for 4 weeks    Check you incision daily and notify your doctor if you notice any redness or drainage.    It is not necessary to take your temperature routinely, but if you feel like you have a fever and it is 101 degrees Farenheit or more, please call your doctor.    OTHER CARE MEASURES    Continue to use incentive spirometer 10 times per hour while awake until your next doctor visit.  Make sure you are drinking enough and urinating at least 4-6 times per day.    Notify physician with:  -fever >101  -one sided calf pain  -vomiting  -increasing abdominal pain  -  sudden onset shoulder pain    MAKE SURE YOU REMAIN HYDRATED.  You can keep water with you at all times and should sip to maintain hydration.

## 2021-07-16 NOTE — Consults (Addendum)
Bariatric consultation    Date Time: 07/16/2021 11:43 AM  Patient Name: Amber, [name], 70 y.o., female  Attending Physician: Willow Ora. Eugean Arnott, DO, FACS      Assessment:   History of Roux-en-Y gastric bypass 2016  Small bowel obstruction    Plan:   N.p.o, IV fluids, NG tube to low her sensation is intact  CT results and images were reviewed patient reevaluated as she likely will need patient has a small bowel obstruction and will need to go to complete emergently on the posterior to the operating room to relieve an obstruction.  All risks of the procedure were explained to the patient including bleeding, infection, bowel injury, surrounding organ injury, possibly conversion open procedure, DVT, PE, pneumonia, prolonged intubation possibility of stroke, myocardial infarction, and etc.  Patient opted proceed with surgery after all her questions were answered.  Informed consent will be signed by the patient.                story of Present Illness:   Amber [name] is a 70 y.o. femaleBody mass index is 26.45 kg/m. who presents to the hospital with with abdominal pain.  Patient was transferred from [facility] emergency room to [facility] with findings of bowel obstruction on CT scan.  Patient has history of gastric bypass in 2016.  2 years ago patient had diagnostic laparoscopy with lysis of adhesions for bowel obstruction.  Patient states the pain started yesterday with nausea and vomiting.  Currently she complains of right-sided and right flank pain.  She has been taking narcotics due to knee pain.  Patient had a knee replacement in January and had to have a arthroscopic surgery for scar tissue.  She denies any chest pain or chest.  Patient has been having regular bowel movements and tolerating diet without any difficulty until yesterday.    Past Medical History:     Past Medical History:   Diagnosis Date    Asthma     Bronchitis 01/02/2019    Chest pain 2016     Chest pain after eating x6  months--being  followed by GI for GERD    Claustrophobia     MRIs    Constipation     Genital herpes     no current outbreak (10/03/15)    GERD (gastroesophageal reflux disease)     Hiatal hernia     h/o surgery    Hyperlipidemia     Hypertensive disorder     Well controlled on meds per pt. Elevated with pain.    Low back pain     Nausea without vomiting     OSA on CPAP 2015    CPAP nightly x 1 yr, used nightly.    Osteoarthritis of knees, bilateral     and back    SBO (small bowel obstruction) 09/2018    TIA (transient ischemic attack) 2014    TIA 2014-no residual. Patient evaulated for possible TIA 07/12/2015  @ [facility] (dischage summary in epic)-per summary CT of head was normal and patient was d/c'd       Past Surgical History:     Past Surgical History:   Procedure Laterality Date    APPENDECTOMY (OPEN)  >25 yrs    CHOLECYSTECTOMY      EGD  01/2015    EGD N/A 01/23/2019    Procedure: EGD;  Surgeon: Champ Mungo, MD;  Location: [facility];  Service: General;  Laterality: N/A;  ENDOSCOPY  MD REQ=30MINS; Q1=UNK  EGD, BIOPSY N/A 09/15/2017    Procedure: EGD, BIOPSY;  Surgeon: Pershing Proud, MD;  Location: [facility];  Service: Gastroenterology;  Laterality: N/A;    fallopian tube surgery  >25 yrs    INSERTION, PAIN PUMP (MEDICAL) N/A 10/22/2015    Procedure: INSERTION, PAIN PUMP (MEDICAL);  Surgeon: Nicola Police, DO;  Location: [facility];  Service: General;  Laterality: N/A;    JOINT REPLACEMENT      LAPAROSCOPIC, CHOLECYSTECTOMY, CHOLANGIOGRAM  08/13/2014    LAPAROSCOPIC, GASTRIC BYPASS N/A 10/22/2015    Procedure: LAPAROSCOPIC, GASTRIC BYPASS;  Surgeon: Nicola Police, DO;  Location: [facility];  Service: General;  Laterality: N/A;    LAPAROSCOPIC, HERNIORRHAPHY, HIATAL N/A 10/22/2015    Procedure: LAPAROSCOPIC, HERNIORRHAPHY, HIATAL;  Surgeon: Nicola Police, DO;  Location: [facility];  Service: General;  Laterality: N/A;    LAPAROSCOPIC, LYSIS, ADHESIONS N/A 09/01/2018     Procedure: LAPAROSCOPIC, LYSIS, ADHESIONS RELEASE OF  SMALL BOWEL OBSTRUCTION;  Surgeon: Champ Mungo, MD;  Location: [facility];  Service: General;  Laterality: N/A;    LAPAROSCOPY, DIAGNOSTIC N/A 09/01/2018    Procedure: LAPAROSCOPY, DIAGNOSTIC;  Surgeon: Champ Mungo, MD;  Location: [facility];  Service: General;  Laterality: N/A;    OVARY SURGERY Right >25 yrs    SPINE SURGERY  11/2013    cervical HNP x 2, Dr. Bufford Buttner    TOOTH EXTRACTION N/A        Family History:     Family History   Problem Relation Age of Onset    Cancer Mother 37        breast cancer    Breast cancer Mother     Heart disease Father     Malignant hyperthermia Neg Hx     Pseudochol deficiency Neg Hx        Social History:     Social History     Tobacco Use    Smoking status: Never    Smokeless tobacco: Never   Vaping Use    Vaping Use: Never used   Substance Use Topics    Alcohol use: Not Currently    Drug use: Never       Allergies:     Allergies   Allergen Reactions    Acyclovir Nausea And Vomiting     Other reaction(s): gi distress    Amlodipine      nausea    Amoxicillin-Pot Clavulanate      Other reaction(s): gi distress      Atorvastatin        Palpitation    Carvedilol      Extreme fatigue    Hydralazine      Headache      Lisinopril      nausea    Lovastatin Nausea And Vomiting     Other reaction(s): gi distress    Metronidazole Nausea And Vomiting     Other reaction(s): gi distress    Moxifloxacin Nausea And Vomiting         GI symptoms    Rosuvastatin      Palpitation, chest pain, abd pain     Statins      Chest pain, palpitations.    Sulfa Antibiotics Nausea And Vomiting     Other reaction(s): gi distress       Medications:     Medications Prior to Admission   Medication Sig    aspirin EC 81 MG EC tablet Take 1  tablet (81 mg total) by mouth daily.    atenolol (TENORMIN) 50 MG tablet TAKE 1 TABLET(50 MG) BY MOUTH TWICE DAILY    benzonatate (TESSALON) 100 MG capsule Take 1 capsule (100 mg total) by mouth 3 (three) times  daily as needed for Cough    Cyanocobalamin (B-12 IJ) Inject as directed every 30 (thirty) days       diazePAM (VALIUM) 5 MG tablet Take 1 tablet (5 mg total) by mouth every 8 (eight) hours as needed for Anxiety    famotidine (PEPCID) 20 MG tablet Take 1 tablet (20 mg total) by mouth daily    fluticasone (FLONASE) 50 MCG/ACT nasal spray 2 sprays by Nasal route daily. (Patient taking differently: 2 sprays by Nasal route as needed)    lidocaine (XYLOCAINE) 2 % jelly Apply topically as needed (sore)    losartan (COZAAR) 100 MG tablet Take 1 tablet (100 mg total) by mouth daily    Multiple Vitamin (MULTIVITAMIN) capsule Take 1 capsule by mouth daily    ondansetron (ZOFRAN-ODT) 4 MG disintegrating tablet Take 1 tablet (4 mg total) by mouth every 8 (eight) hours as needed for Nausea    oxyCODONE-acetaminophen (PERCOCET) 5-325 MG per tablet Take one to two tablets po bid as needed for pain    pantoprazole (PROTONIX) 40 MG tablet Take 1 tablet (40 mg total) by mouth daily    traMADol (ULTRAM) 50 MG tablet 50 mg    valACYclovir HCL (VALTREX) 500 MG tablet TAKE 1 TABLET(500 MG) BY MOUTH DAILY    vitamin D, ergocalciferol, (DRISDOL) 50000 UNIT Cap Take 1 capsule (50,000 Units total) by mouth once a week (Patient not taking: Reported on 06/18/2021)     Prior to Admission medications    Medication Sig Start Date End Date Taking? Authorizing Provider   aspirin EC 81 MG EC tablet Take 1 tablet (81 mg total) by mouth daily. 01/25/18   Carmelina Paddock, MD   atenolol (TENORMIN) 50 MG tablet TAKE 1 TABLET(50 MG) BY MOUTH TWICE DAILY 03/10/21   Bui, Flonnie Overman, MD   benzonatate (TESSALON) 100 MG capsule Take 1 capsule (100 mg total) by mouth 3 (three) times daily as needed for Cough 04/28/21   Bui, Flonnie Overman, MD   Cyanocobalamin (B-12 IJ) Inject as directed every 30 (thirty) days       [provider]   diazePAM (VALIUM) 5 MG tablet Take 1 tablet (5 mg total) by mouth every 8 (eight) hours as needed for Anxiety 08/15/20   Bui, Flonnie Overman, MD   famotidine (PEPCID) 20 MG tablet Take 1 tablet (20 mg total) by mouth daily 07/16/20   Bui, Flonnie Overman, MD   fluticasone (FLONASE) 50 MCG/ACT nasal spray 2 sprays by Nasal route daily.  Patient taking differently: 2 sprays by Nasal route as needed 01/02/14   Oneita Jolly, MD   lidocaine (XYLOCAINE) 2 % jelly Apply topically as needed (sore) 11/15/20 11/15/21  Oneita Jolly, MD   losartan (COZAAR) 100 MG tablet Take 1 tablet (100 mg total) by mouth daily 06/12/21   Bui, Flonnie Overman, MD   Multiple Vitamin (MULTIVITAMIN) capsule Take 1 capsule by mouth daily    [provider]   ondansetron (ZOFRAN-ODT) 4 MG disintegrating tablet Take 1 tablet (4 mg total) by mouth every 8 (eight) hours as needed for Nausea 04/28/21   Oneita Jolly, MD   oxyCODONE-acetaminophen (PERCOCET) 5-325 MG per tablet Take one to two tablets po bid as needed for pain  06/12/21   Oneita Jolly, MD   pantoprazole (PROTONIX) 40 MG tablet Take 1 tablet (40 mg total) by mouth daily 01/22/21   Oneita Jolly, MD   traMADol (ULTRAM) 50 MG tablet 50 mg 01/08/21   [provider]   valACYclovir HCL (VALTREX) 500 MG tablet TAKE 1 TABLET(500 MG) BY MOUTH DAILY 03/08/20   Bui, Flonnie Overman, MD   vitamin D, ergocalciferol, (DRISDOL) 50000 UNIT Cap Take 1 capsule (50,000 Units total) by mouth once a week  Patient not taking: Reported on 06/18/2021 11/15/19   Oneita Jolly, MD       Review of Systems:   Constitutional: negative for fevers, night sweats  Respiratory: negative for SOB, cough  Cardiovascular: negative for chest pain, palpitations  Gastrointestinal: Please refer to history present illness Genitourinary:negative for hematuria, dysuria  Musculoskeletal:negative for bone pain, myalgias and stiff joints  Neurological: negative for dizziness, gait problems, headaches and memory problems  Behavioral/Psych: negative for fatigue, loss of interest in favorite activities, separation anxiety and sleep disturbance  Endocrine: negative for temperature  intolerance  Integumentary: No rashes, no skin infections      Physical Exam:     Vitals:    07/16/21 1000   BP: (!) 183/92   Pulse: (!) 57   Resp: 16   Temp: 97.7 F (36.5 C)   SpO2: 97%         General Appearance:    Alert, cooperative, no distress, appears stated age   Head:    Normocephalic, without obvious abnormality, atraumatic   Eyes:    No scleral icterus            Throat:   Lips, mucosa, and tongue normal; teeth and gums normal   Neck:   Supple, symmetrical, trachea midline, no adenopathy;        thyroid:  No enlargement/tenderness/nodules; no carotid    bruit or JVD       Lungs:     Clear to auscultation bilaterally, respirations unlabored       Heart:    Regular rate and rhythm, S1 and S2 normal, no murmur, rub   or gallop   Abdomen:     Soft, right upper quadrant tenderness and right CVA tenderness, bowel sounds active,     no masses, no organomegaly   Extremities:   Extremities normal, atraumatic, no cyanosis or edema, no calf pain   Pulses:   2+ and symmetric all extremities   Skin:   Skin color, texture, turgor normal, no rashes or lesions   Lymph nodes:   Cervical, supraclavicular, and axillary nodes normal   Neurologic:   Grossly intact       Labs:     @LASTCHEM @  Lab Results   Component Value Date    ALT 10 07/16/2021    AST 16 07/16/2021    ALKPHOS 108 07/16/2021    BILITOTAL 0.5 07/16/2021     Lab Results   Component Value Date    PTT 34 11/25/2020     Lab Results   Component Value Date    VITD 23 (L) 10/04/2019    VITD 33 04/30/2017    VITD 28 04/30/2017    VITD 5 04/30/2017     Lab Results   Component Value Date    CHOL 196 10/04/2019    CHOL 187 08/16/2018    CHOL 151 01/25/2018     Lab Results   Component Value Date    HDL 73 10/04/2019  HDL 80 08/16/2018    HDL 53 01/25/2018     Lab Results   Component Value Date    LDL 115 (H) 10/04/2019    LDL 92 08/16/2018    LDL 93 01/25/2018     Lab Results   Component Value Date    TRIG 40 10/04/2019    TRIG 61 08/16/2018    TRIG 26 (L) 01/25/2018      Lab Results   Component Value Date    TSH 1.09 10/04/2019    TSH 2.28 08/16/2018     Lab Results   Component Value Date    WBC 6.43 07/16/2021    HGB 13.6 07/16/2021    HCT 42.8 07/16/2021    PLT 294 07/16/2021    CHOL 196 10/04/2019    TRIG 40 10/04/2019    HDL 73 10/04/2019    LDL 115 (H) 10/04/2019    ALT 10 07/16/2021    AST 16 07/16/2021    NA 143 07/16/2021    K 4.0 07/16/2021    CL 106 07/16/2021    CREAT 0.7 07/16/2021    BUN 9.0 07/16/2021    CO2 25 07/16/2021    TSH 1.09 10/04/2019    INR 1.0 11/25/2020    GLU 110 (H) 07/16/2021    HGBA1C 4.8 11/25/2020         Radiology Results (24 Hour)       ** No results found for the last 24 hours. **          Rads:   Radiological Procedure reviewed.  CT Abd/ Pelvis without Contrast    Result Date: 07/16/2021  CT ABDOMEN PELVIS WO IV/ WO PO CONT HISTORY: b/l flank pain R>L n/v. COMPARISON: 12/08/2019. TECHNIQUE: Multiple axial CT images were obtained through the abdomen and pelvis without contrast. Coronal and sagittal reconstructions were performed. One of the following dose optimization techniques was utilized in the performance of this exam: Automated exposure control; adjustment of the mA and/or kV according to the patient's size; or use of an iterative  reconstruction technique.  Specific details can be referenced in the facility's radiology CT exam operational policy. FINDINGS: Lung bases: Clear. Hepatobiliary: Liver cysts are stable. The gallbladder has been removed. The common bile duct is normal in size. Spleen: Normal. Pancreas: Normal. Adrenals/Kidneys: The adrenal glands are normal. Right renal cyst is unchanged. The left kidney is normal. No stones or hydronephrosis. Bowel/Mesentery/peritoneum: The appendix is not seen. The patient is status post gastric bypass surgery. 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 Transition point appears to be within the central mesentery proximal to the distal  anastomosis. No free air. Lymph nodes: No enlarged lymph nodes. Vessels: Mild vascular calcifications are seen. Pelvic GU: The bladder is normal. The uterus is unremarkable. Other: Negative. Musculoskeletal: There are no suspicious bony lesions.     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 Transition point appears to be within the central mesentery proximal to the jejunojejunal anastomosis but is difficult to visualize. Jorene Guest, MD  07/16/2021 2:21 AM    Chest AP Portable    Result Date: 07/16/2021  HISTORY:after ngt placement. COMPARISON: 11/26/2020 TECHNIQUE: XR CHEST AP PORTABLE FINDINGS: Lines and tubes: Nasogastric tube is present with the tip in the region of the stomach. Exact location is difficult to determine given the patient's surgical history. Lungs and hila: The lungs are clear. There is no pleural effusion.  There is no pneumothorax. Heart and Mediastinum: The cardiac silhouette is normal in size. Aortic calcifications are present. Bones and soft tissues: No acute abnormality. Upper abdomen: Normal     No acute process. Jorene Guest, MD  07/16/2021 4:51 AM          Signed by: Nicola Police, DO, DO, FACS, FASMBS, FACOS

## 2021-07-16 NOTE — Op Note (Signed)
FULL OPERATIVE NOTE    Date Time: 07/16/21 3:38 PM  Patient Name: Amber [name]  Attending Physician: [name], MD      Date of Operation:   07/16/2021    Providers Performing:   Josefa Half , DO  Assistant:  Duffy Rhody, MD--who was present throughout the whole case and assisted with opening, closing, patient positioning, patient transfer, holding camera, and retraction.    No qualified surgical resident was available to assist in this case    Circulator: Grandville Silos, RN  Scrub Person: Laurelyn Sickle, Sheran Lawless  Second Circulator: Lunette Stands, RN    Operative Procedure:   Procedure(s):  LAPAROSCOPY, DIAGNOSTIC    Preoperative Diagnosis:   Pre-Op Diagnosis Codes:     * SBO (small bowel obstruction) [K56.609]    Postoperative Diagnosis:   Post-Op Diagnosis Codes:     * SBO (small bowel obstruction) [K56.609]    Indications:   This is a 70 year old female who presented with acute onset of abdominal pain with nausea and vomiting.  Patient had history of Roux-en-Y gastric bypass.  On his CT scan showed patient have dilated loops of bowel with fecalization of material in the small bowel.  Patient was complaining of right sided pain.  Surgical nonsurgical options were discussed with the patient.  I recommended emergent diagnostic laparoscopy to rule out small bowel obstruction.  All risks of the procedure were explained to the patient including bleeding, infection, bowel injury, surrounding organ injury, persistent symptoms, DVT, PE, pneumonia, prolonged intubation, stroke, myocardial infarction, and the possibility of conversion to an open procedure.  Patient will pursue surgery after all her questions were answered.  Informed consent was signed by the patient.    Operative Notes:   Patient was transferred from the [facility] care unit to the OR was placed in supine vision or table.  After general endotracheal ovation abdomen was prepped and draped in sterile fashion with ChloraPrep.  Using a 15 blade  knife small incision was made in the infraumbilical area.  Subcutaneous tissue was dissected all the way down to the midline down fascia midline down fascia was elevated to Coker Lam incised using a 15 blade knife.  Peritoneum was then elevated and incised using Metzenbaums.  Abdominal cavity was entered a balloon trocar was inserted into the abdomen.  The abdomen was then insufflated to 15 mmHg with CO2.  A laparoscopic tap block was given at bilateral subcostal and flank areas using a mixture of 0.5 Marcaine plain, saline, and Exparel.  A total of 40 cc was given.  2 5 mm Ethicon XL trochars were then inserted 1 at the the subcostal mid axillary line 1 at the right lower quadrant.  The small bowel was then run from the terminal ileum all the way to the JJ.  There was no dilation noted.  A dilation of the Roux limb was noted upon JJ.  There seem to be something intraluminal at the JJ which was soft and was able to be pushed through the JJ further down.  Upon pushing this down we could see the proximal JJ decompressing.  The JJ mesenteric defect was completely closed.  The false Petersons space was also completely closed.  The biliopancreatic limb was run retrograde all the way to the ligament of Treitz without any abnormality.  The NG tube was confirmed to be in the pouch.  The rest of the abdomen was checked there was no gross abnormalities there was minimal amount of ascites.  The JJ  was examined again there was no kinks or other obstructions noted.  At this point no other cause of obstruction was seen.  The 12 m trocar was then taken out and the fascial defect was closed with a Frances Furbish and a 0 Vicryl suture in a figure-of-eight fashion laparoscopically.  The rest the trochars were then taken under direct vision there was no evidence of any bleeding the abdomen was deflated His bursa was irrigated with saline and the skin His bursa was present using 4 Monocryl in subcu fashion Dermabond was  placed over the incision sites patient taught the procedure well and was transferred to recovery in stable all counts were correct at the end of procedure.    Estimated Blood Loss:   * No values recorded between 07/16/2021  2:29 PM and 07/16/2021  3:38 PM *    Implants:   * No implants in log *    Drains:   Drains: no    Specimens:   * No specimens in log *      Complications:   None    Findings:  Dilated Roux limb up to the JJ with what seems to be a food bezoar that was pushed through the JJ.    Signed by: Nicola Police, DO

## 2021-07-16 NOTE — ED Notes (Signed)
Pt signed out to me by Dr. Lowell Guitar.  MMT in room for transfer and patient requested more pain medication.  On exam tender right UQ area.  She is in no significant distress with stable VS so morphine re-ordered.     Bradly Chris, MD  07/16/21 [phone]

## 2021-07-16 NOTE — ED Notes (Signed)
Pt is extremely anxious r/t NG Tube placement.  Supportive care given.  Pt shaking her head.  When asked what I can do to help?  Pt stated "I had it done about two years ago.  I don't understand why this is happening again to me.  I just know I vomited all over the first time.  I vomited to my knee's, my underwear got wet. I'm just not ready."  Supportive care given.  Will give pt time to prepare herself.

## 2021-07-16 NOTE — ED Notes (Signed)
Pt's friend brought back to bedside for support.  Pt given time for morphine to work.  Supplies at bedside for NGT insertion.  Will completed when pt is done.

## 2021-07-16 NOTE — Anesthesia Preprocedure Evaluation (Addendum)
Anesthesia Evaluation    AIRWAY    Mallampati: II    TM distance: >3 FB  Neck ROM: full  Mouth Opening:full  Planned to use difficult airway equipment: No CARDIOVASCULAR    cardiovascular exam normal, regular and normal       DENTAL    no notable dental hx     PULMONARY    pulmonary exam normal and clear to auscultation     OTHER FINDINGS                  Relevant Problems   ANESTHESIA   (+) Obstructive sleep apnea syndrome      PULMONARY   (+) Obstructive sleep apnea syndrome      NEURO/PSYCH   (+) Headache   (+) History of chronic sinusitis      CARDIO   (+) Essential hypertension   (+) Labile hypertension   (+) Transient cerebral ischemia, unspecified type      GI   (+) Gastroesophageal reflux disease   (+) Hiatal hernia               Anesthesia Plan    ASA 3     general               ( Pt seen ,chart reviewed.   Discussed r/b/a.  All questions answered.)      intravenous induction   Detailed anesthesia plan: general endotracheal        Post op pain management: per surgeon    informed consent obtained    Plan discussed with CRNA.    ECG reviewed  pertinent labs reviewed             Signed by: Daine Gip, MD 07/16/21 1:56 PM

## 2021-07-16 NOTE — ED Notes (Signed)
Pt resting comfortably.  No distress noted.  Pt remains on low intermittent suction.  Report given to nurse Trish.  Relinquished care of pt.

## 2021-07-16 NOTE — ED Notes (Signed)
Walked pt through the steps I take to place and NGT.  Pt remains anxious.  Acknowledged pt's religous beliefs.  Pt stated "He has carried me trough everything."  Held pt's hand.  Discussed what to expect.  Pt agreed to NGT.

## 2021-07-16 NOTE — ED Provider Notes (Signed)
EMERGENCY DEPARTMENT NOTE    Event Date/Time    Physician/Midlevel provider first contact with patient: 07/16/21 0024         HISTORY OF PRESENT ILLNESS   Historian: Patient  Translator Used: None     70 y.o. female presents with b/l flank pain radiating to the abdomen with associated n/v. R side pain>L. Denies urinary symptoms, f/c    Location of symptoms: flank  Onset of symptoms: today  What was patient doing when symptoms started (Context): at rest  Severity: severe  Timing: constant  Activities that worsen symptoms: none  Activities that improve symptoms: none  Quality: spasm/ache   Radiation of symptoms: abdomen  Associated signs and Symptoms: n/v   Are symptoms worsening? y          MEDICAL HISTORY     Past Medical History:  Past Medical History:   Diagnosis Date    Asthma     Bronchitis 01/02/2019    Chest pain 2016     Chest pain after eating x6  months--being followed by GI for GERD    Claustrophobia     MRIs    Constipation     Genital herpes     no current outbreak (10/03/15)    GERD (gastroesophageal reflux disease)     Hiatal hernia     h/o surgery    Hyperlipidemia     Hypertensive disorder     Well controlled on meds per pt. Elevated with pain.    Low back pain     Nausea without vomiting     OSA on CPAP 2015    CPAP nightly x 1 yr, used nightly.    Osteoarthritis of knees, bilateral     and back    SBO (small bowel obstruction) 09/2018    TIA (transient ischemic attack) 2014    TIA 2014-no residual. Patient evaulated for possible TIA 07/12/2015  @ [facility] (dischage summary in epic)-per summary CT of head was normal and patient was d/c'd       Past Surgical History:  Past Surgical History:   Procedure Laterality Date    APPENDECTOMY (OPEN)  >25 yrs    CHOLECYSTECTOMY      EGD  01/2015    EGD N/A 01/23/2019    Procedure: EGD;  Surgeon: Champ Mungo, MD;  Location: [facility];  Service: General;  Laterality: N/A;  ENDOSCOPY  MD REQ=30MINS; Q1=UNK    EGD, BIOPSY N/A 09/15/2017    Procedure: EGD,  BIOPSY;  Surgeon: Pershing Proud, MD;  Location: [facility];  Service: Gastroenterology;  Laterality: N/A;    fallopian tube surgery  >25 yrs    INSERTION, PAIN PUMP (MEDICAL) N/A 10/22/2015    Procedure: INSERTION, PAIN PUMP (MEDICAL);  Surgeon: Nicola Police, DO;  Location: [facility];  Service: General;  Laterality: N/A;    JOINT REPLACEMENT      LAPAROSCOPIC, CHOLECYSTECTOMY, CHOLANGIOGRAM  08/13/2014    LAPAROSCOPIC, GASTRIC BYPASS N/A 10/22/2015    Procedure: LAPAROSCOPIC, GASTRIC BYPASS;  Surgeon: Jeanell Sparrow, Hamid R, DO;  Location: [facility];  Service: General;  Laterality: N/A;    LAPAROSCOPIC, HERNIORRHAPHY, HIATAL N/A 10/22/2015    Procedure: LAPAROSCOPIC, HERNIORRHAPHY, HIATAL;  Surgeon: Nicola Police, DO;  Location: [facility];  Service: General;  Laterality: N/A;    LAPAROSCOPIC, LYSIS, ADHESIONS N/A 09/01/2018    Procedure: LAPAROSCOPIC, LYSIS, ADHESIONS RELEASE OF  SMALL BOWEL OBSTRUCTION;  Surgeon: Champ Mungo, MD;  Location: [facility];  Service: General;  Laterality: N/A;    LAPAROSCOPY, DIAGNOSTIC N/A 09/01/2018    Procedure: LAPAROSCOPY, DIAGNOSTIC;  Surgeon: Champ Mungo, MD;  Location: [facility] [facility];  Service: General;  Laterality: N/A;    OVARY SURGERY Right >25 yrs    SPINE SURGERY  11/2013    cervical HNP x 2, Dr. Bufford Buttner    TOOTH EXTRACTION N/A        Social History:  Social History     Socioeconomic History    Marital status: Single    Number of children: 2   Occupational History    Occupation: Acupuncturist: [employer]    Tobacco Use    Smoking status: Never    Smokeless tobacco: Never   Vaping Use    Vaping Use: Never used   Substance and Sexual Activity    Alcohol use: Not Currently    Drug use: Never    Sexual activity: Not Currently     Partners: Male     Birth control/protection: Post-menopausal   Other Topics Concern    Dietary supplements / vitamins Yes    Anesthesia problems No    Blood thinners  No    Eats large amounts No    Excessive Sweets No    Skips meals No    Eats excessive starches Yes    Snacks or grazes No    Emotional eater Yes    Eats fried food Yes    Eats fast food Yes    Diet Center No    HMR No    [name] No    LA Weight Loss No    Nutri-System No    Opti-Fast / Medi-Fast No    Overeaters Anonymous No    [facility] No    TOPS No    Weight Watchers No    Atkins No    Binging / Purging No    Body for Life No    Cabbage Soup No    Calorie Counting No    Fasting No    [name] No    Health Spa No    Herbal Life No    High Protein No    Low Carb No    Low Fat No    [facility] Diet No    P[place] Diet No    Richard Simmons Diet No    [place] Diet No    Slim Fast No    [place] No    Sugar Busters No    Vomiting No    Zone Diet No    Stationary cycle or treadmill No    Gym/fitness Classes No    Home exercise/video No    Swimming No    Team sports No    Weight training No    Walking or running No    Hospitalization No    Hypnosis No    Physical therapy No    Psychological therapy No    Residential program No    Acutrim No    Amphetamines No    Anorex No    Byetta No    Dexatrim No    Didrex No    Fastin No    Fen - Phen No    Ionamin / Adipex No    Mazanor No    Meridia No    Obalan No    Phendiet No    Phentrol No  Phentermine Yes    Plegine No    Pondimin No    Qsymia No    Prozac No    Redux No    Sanorex No    Tenuate No    Tepanole No    Wechless No    Wellbutrin No    Xenical (Orlistat, Alli) No    Other Med No    No impairment Yes     Comment: Chronic Bilat Knee Pain    Walks with cane/crutch Yes    Requires a wheelchair No    Bedridden No    Are you currently being treated for depression? No    Do you snore? Yes    Are you receiving any medical or psychological services? No    Do you have or have you been treated for an eating disorder? No    Do you exercise regularly? No    Have you or family member ever have trouble with anesthesia? No     Social  Determinants of Psychologist, prison and probation services Strain: Low Risk     Difficulty of Paying Living Expenses: Not hard at all   Food Insecurity: No Food Insecurity    Worried About Programme researcher, broadcasting/film/video in the Last Year: Never true    Ran Out of Food in the Last Year: Never true   Transportation Needs: No Transportation Needs    Lack of Transportation (Medical): No    Lack of Transportation (Non-Medical): No   Physical Activity: Inactive    Days of Exercise per Week: 0 days    Minutes of Exercise per Session: 0 min   Stress: No Stress Concern Present    Feeling of Stress : Not at all   Social Connections: Moderately Integrated    Frequency of Communication with Friends and Family: More than three times a week    Frequency of Social Gatherings with Friends and Family: More than three times a week    Attends Religious Services: More than 4 times per year    Active Member of Clubs or Organizations: Yes    Attends Engineer, structural: More than 4 times per year    Marital Status: Never married   Housing Stability: Unknown    Unable to Pay for Housing in the Last Year: No    Unstable Housing in the Last Year: No       Family History:  Family History   Problem Relation Age of Onset    Cancer Mother 36        breast cancer    Breast cancer Mother     Heart disease Father     Malignant hyperthermia Neg Hx     Pseudochol deficiency Neg Hx        Outpatient Medication:  Previous Medications    ASPIRIN EC 81 MG EC TABLET    Take 1 tablet (81 mg total) by mouth daily.    ATENOLOL (TENORMIN) 50 MG TABLET    TAKE 1 TABLET(50 MG) BY MOUTH TWICE DAILY    BENZONATATE (TESSALON) 100 MG CAPSULE    Take 1 capsule (100 mg total) by mouth 3 (three) times daily as needed for Cough    CYANOCOBALAMIN (B-12 IJ)    Inject as directed every 30 (thirty) days       DIAZEPAM (VALIUM) 5 MG TABLET    Take 1 tablet (5 mg total) by mouth every 8 (eight) hours as needed for Anxiety    FAMOTIDINE (PEPCID)  20 MG TABLET    Take 1 tablet (20 mg total)  by mouth daily    FLUTICASONE (FLONASE) 50 MCG/ACT NASAL SPRAY    2 sprays by Nasal route daily.    LIDOCAINE (XYLOCAINE) 2 % JELLY    Apply topically as needed (sore)    LOSARTAN (COZAAR) 100 MG TABLET    Take 1 tablet (100 mg total) by mouth daily    MULTIPLE VITAMIN (MULTIVITAMIN) CAPSULE    Take 1 capsule by mouth daily    ONDANSETRON (ZOFRAN-ODT) 4 MG DISINTEGRATING TABLET    Take 1 tablet (4 mg total) by mouth every 8 (eight) hours as needed for Nausea    OXYCODONE-ACETAMINOPHEN (PERCOCET) 5-325 MG PER TABLET    Take one to two tablets po bid as needed for pain    PANTOPRAZOLE (PROTONIX) 40 MG TABLET    Take 1 tablet (40 mg total) by mouth daily    TRAMADOL (ULTRAM) 50 MG TABLET    50 mg    VALACYCLOVIR HCL (VALTREX) 500 MG TABLET    TAKE 1 TABLET(500 MG) BY MOUTH DAILY    VITAMIN D, ERGOCALCIFEROL, (DRISDOL) 50000 UNIT CAP    Take 1 capsule (50,000 Units total) by mouth once a week       Allergies:  Allergies   Allergen Reactions    Acyclovir Nausea And Vomiting     Other reaction(s): gi distress    Amlodipine      nausea    Amoxicillin-Pot Clavulanate      Other reaction(s): gi distress      Atorvastatin        Palpitation    Carvedilol      Extreme fatigue    Hydralazine      Headache      Lisinopril      nausea    Lovastatin Nausea And Vomiting     Other reaction(s): gi distress    Metronidazole Nausea And Vomiting     Other reaction(s): gi distress    Moxifloxacin Nausea And Vomiting         GI symptoms    Rosuvastatin      Palpitation, chest pain, abd pain     Statins      Chest pain, palpitations.    Sulfa Antibiotics Nausea And Vomiting     Other reaction(s): gi distress         REVIEW OF SYSTEMS   Review of Systems   Gastrointestinal:  Positive for abdominal pain, nausea and vomiting.   Genitourinary:  Positive for flank pain.   All other systems reviewed and are negative.      PHYSICAL EXAM     Vitals:    07/16/21 0024 07/16/21 0100 07/16/21 0130   BP: (!) 212/95 (!) 202/87 197/90   Pulse: 68 89 62    Resp: 20 20 20    Temp: 98.1 F (36.7 C)     TempSrc: Oral     SpO2: 99% 96% 100%   Weight: 63 kg     Height: 5\' 1"  (1.549 m)           Nursing note and vitals reviewed.  Constitutional:  Well developed, well nourished. Awake & Oriented x3. Painful distress  Head:  Atraumatic. Normocephalic.    Eyes:  EOMI. Conjunctivae are not pale.  ENT:  Mucous membranes are moist and intact. Oropharynx is clear and symmetric.  Patent airway.  Neck:  Supple. Full ROM.    Cardiovascular:  Regular rate. Regular rhythm. No murmurs,  rubs, or gallops.  Pulmonary/Chest:  No evidence of respiratory distress. Clear to auscultation bilaterally.  No wheezing, rales or rhonchi.   Abdominal:  Soft and non-distended. There is R sided abd tenderness. No rebound, guarding, or rigidity.  Back:  Full ROM. TTP R CVA  Extremities:  No edema. No cyanosis. No clubbing. Full range of motion in all extremities.  Skin:  Skin is warm and dry.  No diaphoresis. No rash.   Neurological:  Alert, awake, and appropriate. Normal speech. Motor normal.  Psychiatric:  Good eye contact. Normal interaction, affect, and behavior.          MEDICAL DECISION MAKING       Ua r/o pyelonephritis. Ctap noncontrast ordered r/o stones. Given ivf toradol and morphine for pain. Zofran given for nausea. No active vomiting  CT with sbo and patient has a history of prior gastric bypass. 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   Nursing Notes: Reviewed and utilized available nursing notes.  Medical Records Reviewed: Reviewed available past medical records.  Counseling: The emergency provider has spoken with the patient and discussed today's findings, in addition to providing specific details for the plan of care.  Questions are answered and there is agreement with the  plan.    IMAGING STUDIES    The following imaging studies were independently interpreted by the Emergency Medicine Physician.  For full imaging study results please see chart.    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 Transition point appears to be within the   central mesentery proximal to the jejunojejunal anastomosis but is   difficult to visualize.     Jorene Guest, MD    07/16/2021 2:21 AM            Narrative:    CT ABDOMEN PELVIS WO IV/ WO PO CONT       HISTORY: b/l flank pain R>L n/v.     COMPARISON: 12/08/2019.     TECHNIQUE: Multiple axial CT images were obtained through the abdomen   and pelvis without contrast. Coronal and sagittal reconstructions were   performed. One of the following dose optimization techniques was   utilized in the performance of this exam: Automated exposure control;   adjustment of the mA and/or kV according to the patient's size; or use   of an iterative  reconstruction technique.  Specific details can be   referenced in the facility's radiology CT exam operational policy.     FINDINGS:     Lung bases: Clear.     Hepatobiliary: Liver cysts are stable. The gallbladder has been removed.   The common bile duct is normal in size.     Spleen: Normal.     Pancreas: Normal.     Adrenals/Kidneys: The adrenal glands are normal. Right renal cyst is   unchanged. The left kidney is normal. No stones or hydronephrosis.     Bowel/Mesentery/peritoneum: The appendix is not seen. The patient is  status post gastric bypass surgery. 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 Transition   point appears to be within the central mesentery proximal to the distal   anastomosis. No free air.     Lymph nodes: No enlarged lymph nodes.      Vessels: Mild vascular calcifications are seen.     Pelvic GU: The bladder is normal. The uterus is unremarkable.     Other: Negative.     Musculoskeletal: There are no suspicious bony lesions.                       CARDIAC STUDIES     The following cardiac studies were independently interpreted by the Emergency Medicine Physician. For full cardiac study results please see chart         PULSE OXIMETRY    Oxygen Saturation by Pulse Oximetry: 100%  Interventions:   Interpretation: normal    EMERGENCY DEPT. MEDICATIONS      ED Medication Orders (From admission, onward)      Start Ordered     Status Ordering Provider    07/16/21 0056 07/16/21 0055  ketorolac (TORADOL) injection 15 mg  Once        Route: Intravenous  Ordered Dose: 15 mg     Last MAR action: Given Evonnie Dawes    07/16/21 0056 07/16/21 0055  morphine injection 4 mg  Once        Route: Intravenous  Ordered Dose: 4 mg     Last MAR action: Given Evonnie Dawes    07/16/21 0056 07/16/21 0055  ondansetron (ZOFRAN) injection 4 mg  Once        Route: Intravenous  Ordered Dose: 4 mg     Last MAR action: Given Evonnie Dawes    07/16/21 0026 07/16/21 0025  sodium chloride 0.9 % bolus 1,000 mL  Once        Route: Intravenous  Ordered Dose: 1,000 mL     Last MAR action: New Bag Charlee Whitebread Y            LABORATORY RESULTS    Ordered and independently interpreted AVAILABLE laboratory tests. Please see results section in chart for full details.  Results for orders placed or performed during the hospital encounter of 07/16/21   Basic Metabolic Panel   Result Value Ref Range    Glucose 110 (H) 70 - 100 mg/dL    BUN 9.0 7.0 - 16.1 mg/dL    Creatinine 0.7 0.4 - 1.0 mg/dL    Calcium 9.4 8.5 - 09.6 mg/dL    Sodium 045 409 - 811 mEq/L    Potassium 4.0 3.5 - 5.3 mEq/L    Chloride 106 99 - 111 mEq/L    CO2 25 17 - 29 mEq/L    Anion Gap 12.0 5.0 - 15.0   CBC and differential   Result Value Ref Range    WBC 6.43 3.10 - 9.50 x10 3/uL    Hgb 13.6 11.4 - 14.8 g/dL     Hematocrit 91.4 78.2 - 43.7 %    Platelets 294 142 - 346 x10 3/uL    RBC 4.93 3.90 - 5.10 x10 6/uL    MCV 86.8 78.0 - 96.0 fL    MCH 27.6 25.1 - 33.5 pg    MCHC 31.8 31.5 - 35.8 g/dL    RDW 15 11 - 15 %    MPV 9.4  8.9 - 12.5 fL    Neutrophils 72.3 None %    Lymphocytes Automated 20.1 None %    Monocytes 5.8 None %    Eosinophils Automated 1.1 None %    Basophils Automated 0.5 None %    Immature Granulocytes 0.2 None %    Nucleated RBC 0.0 0.0 - 0.0 /100 WBC    Neutrophils Absolute 4.66 1.10 - 6.33 x10 3/uL    Lymphocytes Absolute Automated 1.29 0.42 - 3.22 x10 3/uL    Monocytes Absolute Automated 0.37 0.21 - 0.85 x10 3/uL    Eosinophils Absolute Automated 0.07 0.00 - 0.44 x10 3/uL    Basophils Absolute Automated 0.03 0.00 - 0.08 x10 3/uL    Immature Granulocytes Absolute 0.01 0.00 - 0.07 x10 3/uL    Absolute NRBC 0.00 0.00 - 0.00 x10 3/uL   Lipase   Result Value Ref Range    Lipase 8 8 - 78 U/L   Hepatic function panel (LFT)   Result Value Ref Range    Bilirubin, Total 0.5 0.2 - 1.2 mg/dL    Bilirubin Direct 0.2 0.0 - 0.5 mg/dL    Bilirubin Indirect 0.3 0.2 - 1.0 mg/dL    AST (SGOT) 16 5 - 41 U/L    ALT 10 0 - 55 U/L    Alkaline Phosphatase 108 37 - 117 U/L    Protein, Total 7.2 6.0 - 8.3 g/dL    Albumin 4.0 3.5 - 5.0 g/dL    Globulin 3.2 2.0 - 3.6 g/dL    Albumin/Globulin Ratio 1.3 0.9 - 2.2   GFR   Result Value Ref Range    EGFR >60.0        CONSULTATIONS        CRITICAL CARE        ATTESTATIONS        Physician Attestation: I, Dr Marnee Spring DO, have been the primary provider for [name] during this Emergency Dept visit and have reviewed the chart for accuracy and agree with its content.       This note was generated by the Epic EMR system/ Dragon speech recognition and may contain inherent errors or omissions not intended by the user. Grammatical errors, random word insertions, deletions and pronoun errors  are occasional consequences of this technology due to software limitations. Not all errors  are caught or corrected. If there are questions or concerns about the content of this note or information contained within the body of this dictation they should be addressed directly with the author for clarification.      DIAGNOSIS      Diagnosis:  Final diagnoses:   None       Disposition:  ED Disposition       None            Prescriptions:  Patient's Medications   New Prescriptions    No medications on file   Previous Medications    ASPIRIN EC 81 MG EC TABLET    Take 1 tablet (81 mg total) by mouth daily.    ATENOLOL (TENORMIN) 50 MG TABLET    TAKE 1 TABLET(50 MG) BY MOUTH TWICE DAILY    BENZONATATE (TESSALON) 100 MG CAPSULE    Take 1 capsule (100 mg total) by mouth 3 (three) times daily as needed for Cough    CYANOCOBALAMIN (B-12 IJ)    Inject as directed every 30 (thirty) days       DIAZEPAM (VALIUM) 5 MG TABLET    Take  1 tablet (5 mg total) by mouth every 8 (eight) hours as needed for Anxiety    FAMOTIDINE (PEPCID) 20 MG TABLET    Take 1 tablet (20 mg total) by mouth daily    FLUTICASONE (FLONASE) 50 MCG/ACT NASAL SPRAY    2 sprays by Nasal route daily.    LIDOCAINE (XYLOCAINE) 2 % JELLY    Apply topically as needed (sore)    LOSARTAN (COZAAR) 100 MG TABLET    Take 1 tablet (100 mg total) by mouth daily    MULTIPLE VITAMIN (MULTIVITAMIN) CAPSULE    Take 1 capsule by mouth daily    ONDANSETRON (ZOFRAN-ODT) 4 MG DISINTEGRATING TABLET    Take 1 tablet (4 mg total) by mouth every 8 (eight) hours as needed for Nausea    OXYCODONE-ACETAMINOPHEN (PERCOCET) 5-325 MG PER TABLET    Take one to two tablets po bid as needed for pain    PANTOPRAZOLE (PROTONIX) 40 MG TABLET    Take 1 tablet (40 mg total) by mouth daily    TRAMADOL (ULTRAM) 50 MG TABLET    50 mg    VALACYCLOVIR HCL (VALTREX) 500 MG TABLET    TAKE 1 TABLET(500 MG) BY MOUTH DAILY    VITAMIN D, ERGOCALCIFEROL, (DRISDOL) 50000 UNIT CAP    Take 1 capsule (50,000 Units total) by mouth once a week   Modified Medications    No medications on file   Discontinued  Medications    No medications on file

## 2021-07-16 NOTE — ED Triage Notes (Signed)
Pt arrived ambulatory, assisted to W/C with c/o right flank pain 10/10.  Onset of pain 2000.  Pt took one percocet at 2030 with minimal relifef.  Pt denies urinary symptoms.  Pt c/o nausea and vomiting x 3 s/p onset of pain.  Pt denies fever.  Pt c/o pain radiating toward the RUQ.  Pt moaning and groaning, unable to get comfortable.  Pt denies hx of kidney stones.  Pt stated "I thought it was just gas."

## 2021-07-16 NOTE — Transfer of Care (Signed)
Anesthesia Transfer of Care Note    Patient: Amber [name]    Procedures performed: Procedure(s):  LAPAROSCOPY, DIAGNOSTIC    Anesthesia type: General ETT    Patient location:Phase I PACU    Last vitals:   Vitals:    07/16/21 1605   BP: 178/81   Pulse: 77   Resp: 17   Temp: 36.2 C (97.2 F)   SpO2: 100%       Post pain: Patient not complaining of pain, continue current therapy      Mental Status:awake and alert     Respiratory Function: tolerating face mask    Cardiovascular: stable    Nausea/Vomiting: patient not complaining of nausea or vomiting    Hydration Status: adequate    Post assessment: no apparent anesthetic complications, no reportable events and no evidence of recall    Signed by: Ulanda Edison, CRNA  07/16/21 4:06 PM

## 2021-07-17 ENCOUNTER — Encounter: Payer: Self-pay | Admitting: Surgery

## 2021-07-17 ENCOUNTER — Inpatient Hospital Stay: Payer: Medicare Other

## 2021-07-17 LAB — CBC AND DIFFERENTIAL
Absolute NRBC: 0 10*3/uL (ref 0.00–0.00)
Basophils Absolute Automated: 0.01 10*3/uL (ref 0.00–0.08)
Basophils Automated: 0.2 %
Eosinophils Absolute Automated: 0.01 10*3/uL (ref 0.00–0.44)
Eosinophils Automated: 0.2 %
Hematocrit: 35.4 % (ref 34.7–43.7)
Hgb: 11.9 g/dL (ref 11.4–14.8)
Immature Granulocytes Absolute: 0.01 10*3/uL (ref 0.00–0.07)
Immature Granulocytes: 0.2 %
Lymphocytes Absolute Automated: 0.57 10*3/uL (ref 0.42–3.22)
Lymphocytes Automated: 13.3 %
MCH: 29 pg (ref 25.1–33.5)
MCHC: 33.6 g/dL (ref 31.5–35.8)
MCV: 86.3 fL (ref 78.0–96.0)
MPV: 9 fL (ref 8.9–12.5)
Monocytes Absolute Automated: 0.49 10*3/uL (ref 0.21–0.85)
Monocytes: 11.4 %
Neutrophils Absolute: 3.21 10*3/uL (ref 1.10–6.33)
Neutrophils: 74.7 %
Nucleated RBC: 0 /100 WBC (ref 0.0–0.0)
Platelets: 194 10*3/uL (ref 142–346)
RBC: 4.1 10*6/uL (ref 3.90–5.10)
RDW: 15 % (ref 11–15)
WBC: 4.3 10*3/uL (ref 3.10–9.50)

## 2021-07-17 LAB — BASIC METABOLIC PANEL
Anion Gap: 10 (ref 5.0–15.0)
BUN: 7 mg/dL (ref 7.0–21.0)
CO2: 23 mEq/L (ref 17–29)
Calcium: 8.6 mg/dL (ref 8.5–10.5)
Chloride: 106 mEq/L (ref 99–111)
Creatinine: 0.6 mg/dL (ref 0.4–1.0)
Glucose: 113 mg/dL — ABNORMAL HIGH (ref 70–100)
Potassium: 4.1 mEq/L (ref 3.5–5.3)
Sodium: 139 mEq/L (ref 135–145)

## 2021-07-17 LAB — GFR: EGFR: >60

## 2021-07-17 MED ORDER — DOCUSATE SODIUM 100 MG PO CAPS
100.0000 mg | ORAL_CAPSULE | Freq: Two times a day (BID) | ORAL | 0 refills | Status: AC | PRN
Start: 2021-07-17 — End: 2021-07-27

## 2021-07-17 MED ORDER — GABAPENTIN 300 MG PO CAPS
300.0000 mg | ORAL_CAPSULE | Freq: Three times a day (TID) | ORAL | Status: DC
Start: 2021-07-17 — End: 2021-07-17
  Administered 2021-07-17: 14:00:00 300 mg via ORAL
  Filled 2021-07-17: qty 1

## 2021-07-17 MED ORDER — LOSARTAN POTASSIUM 100 MG PO TABS
100.0000 mg | ORAL_TABLET | Freq: Every day | ORAL | Status: DC
Start: 2021-07-17 — End: 2021-07-17
  Administered 2021-07-17: 12:00:00 100 mg via ORAL
  Filled 2021-07-17: qty 1

## 2021-07-17 MED ORDER — FAMOTIDINE 20 MG PO TABS
20.0000 mg | ORAL_TABLET | Freq: Every day | ORAL | Status: DC
Start: 2021-07-17 — End: 2021-07-17
  Filled 2021-07-17: qty 1

## 2021-07-17 MED ORDER — DIATRIZOATE MEGLUMINE & SODIUM 66-10 % PO SOLN
237.0000 mL | Freq: Once | ORAL | Status: AC | PRN
Start: 2021-07-17 — End: 2021-07-17
  Administered 2021-07-17: 08:00:00 237 mL via ORAL
  Filled 2021-07-17: qty 237

## 2021-07-17 MED ORDER — OXYCODONE HCL 5 MG/5ML PO SOLN
5.0000 mg | ORAL | 0 refills | Status: AC | PRN
Start: 2021-07-17 — End: 2021-07-24

## 2021-07-17 MED ORDER — NALOXONE HCL 4 MG/0.1ML NA LIQD
NASAL | 0 refills | Status: DC
Start: 2021-07-17 — End: 2022-03-12

## 2021-07-17 MED ORDER — ATENOLOL 50 MG PO TABS
50.0000 mg | ORAL_TABLET | Freq: Two times a day (BID) | ORAL | Status: DC
Start: 2021-07-17 — End: 2021-07-17
  Administered 2021-07-17: 12:00:00 50 mg via ORAL
  Filled 2021-07-17: qty 1

## 2021-07-17 MED ORDER — ACETAMINOPHEN 160 MG/5ML PO SOLN
650.0000 mg | Freq: Four times a day (QID) | ORAL | Status: DC
Start: 2021-07-17 — End: 2021-07-17
  Administered 2021-07-17 (×2): 650 mg via ORAL
  Filled 2021-07-17 (×2): qty 20.3

## 2021-07-17 MED ORDER — OXYCODONE HCL 5 MG PO TABS
5.0000 mg | ORAL_TABLET | ORAL | Status: DC | PRN
Start: 2021-07-17 — End: 2021-07-17

## 2021-07-17 NOTE — Plan of Care (Signed)
Pt alert and oriented x 4. Pain managed with prescribed pain regimen. Voiding/ambulating. NG tube removed this morning as ordered. Pt denied nausea/vomiting, on full liquid diet. Abd surgical site CDI. Safety precautions in place, call bell within reach.     Problem: Safety  Goal: Patient will be free from injury during hospitalization  Outcome: Progressing  Flowsheets (Taken 07/17/2021 1100)  Patient will be free from injury during hospitalization:   Assess patient's risk for falls and implement fall prevention plan of care per policy   Provide and maintain safe environment   Use appropriate transfer methods   Hourly rounding   Ensure appropriate safety devices are available at the bedside  Goal: Patient will be free from infection during hospitalization  Outcome: Progressing  Flowsheets (Taken 07/17/2021 1100)  Free from Infection during hospitalization:   Assess and monitor for signs and symptoms of infection   Monitor lab/diagnostic results     Problem: Pain  Goal: Pain at adequate level as identified by patient  Outcome: Progressing  Flowsheets (Taken 07/17/2021 1100)  Pain at adequate level as identified by patient:   Identify patient comfort function goal   Assess for risk of opioid induced respiratory depression, including snoring/sleep apnea. Alert healthcare team of risk factors identified.   Assess pain on admission, during daily assessment and/or before any "as needed" intervention(s)   Reassess pain within 30-60 minutes of any procedure/intervention, per Pain Assessment, Intervention, Reassessment (AIR) Cycle   Evaluate if patient comfort function goal is met     Problem: Side Effects from Pain Analgesia  Goal: Patient will experience minimal side effects of analgesic therapy  Outcome: Progressing  Flowsheets (Taken 07/17/2021 1100)  Patient will experience minimal side effects of analgesic therapy:   Monitor/assess patient's respiratory status (RR depth, effort, breath sounds)   Assess for changes in  cognitive function   Prevent/manage side effects per LIP orders (i.e. nausea, vomiting, pruritus, constipation, urinary retention, etc.)

## 2021-07-17 NOTE — Progress Notes (Signed)
PROGRESS NOTE    Date Time: 07/17/2021  Patient Name: Amber [name]    Subjective:   Feeling well. Pain is under control on current management. No vomiting but does endorse intermittent nausea. Has not passed flatus yet. Ambulating and voiding well.  Main complaint is throat discomfort from NGT, and feeling of mucous in back of throat.  NGT with output.    Medications:     Current Facility-Administered Medications   Medication Dose Route Frequency Last Rate Last Admin    glucagon (rDNA) (GLUCAGEN) injection 1 mg  1 mg Intramuscular PRN        And    dextrose 5 % bolus 250 mL  250 mL Intravenous PRN        And    dextrose 50 % bolus 25 g  25 g Intravenous PRN        And    dextrose (D10W) 10% bolus 250 mL  25 g Intravenous PRN        dextrose 5 % and 0.45 % NaCl with KCl 20 mEq/L infusion   Intravenous Continuous 100 mL/hr at 07/17/21 0353 New Bag at 07/17/21 0353    enoxaparin (LOVENOX) syringe 30 mg  30 mg Subcutaneous Q24H SCH        melatonin tablet 3 mg  3 mg Oral QHS PRN        metoprolol tartrate (LOPRESSOR) injection 5 mg  5 mg Intravenous 4 times per day   5 mg at 07/17/21 1610    morphine injection 4 mg  4 mg Intravenous Q4H PRN   4 mg at 07/17/21 0613    naloxone (NARCAN) injection 0.2 mg  0.2 mg Intravenous PRN        ondansetron (ZOFRAN) injection 4 mg  4 mg Intravenous Q6H PRN        pantoprazole (PROTONIX) injection 40 mg  40 mg Intravenous Daily           Physical Exam:     Visit Vitals  BP 161/83   Pulse 73   Temp 98.4 F (36.9 C) (Oral)   Resp 18   Ht 1.549 m (5\' 1" )   Wt 63 kg (139 lb)   LMP  (LMP Unknown)   SpO2 99%   BMI 26.26 kg/m         Intake and Output Summary (Last 24 hours) at Date Time    Intake/Output Summary (Last 24 hours) at 12/23/17 0643  Last data filed at 12/23/17 0118      Intake/Output Summary (Last 24 hours) at 07/17/2021 9604  Last data filed at 07/17/2021 0349  Gross per 24 hour   Intake 900 ml   Output 700 ml   Net 200 ml       General appearance - alert, well  appearing, and in no distress  HEENT: NGT with dark yellow output  Chest - nonlabored respirations  Heart - normal rate, regular rhythm  Abdomen - soft, nondistended, Incisions intact and appropriately tender  Extremities - no pedal edema    Labs:     Results       Procedure Component Value Units Date/Time    CBC and differential [[id number]] Collected: 07/17/21 0443    Specimen: Blood Updated: 07/17/21 0507     WBC 4.30 x10 3/uL      Hgb 11.9 g/dL      Hematocrit 47.8 %      Platelets 194 x10 3/uL      RBC 4.10 x10  6/uL      MCV 86.3 fL      MCH 29.0 pg      MCHC 33.6 g/dL      RDW 15 %      MPV 9.0 fL      Neutrophils 74.7 %      Lymphocytes Automated 13.3 %      Monocytes 11.4 %      Eosinophils Automated 0.2 %      Basophils Automated 0.2 %      Immature Granulocytes 0.2 %      Nucleated RBC 0.0 /100 WBC      Neutrophils Absolute 3.21 x10 3/uL      Lymphocytes Absolute Automated 0.57 x10 3/uL      Monocytes Absolute Automated 0.49 x10 3/uL      Eosinophils Absolute Automated 0.01 x10 3/uL      Basophils Absolute Automated 0.01 x10 3/uL      Immature Granulocytes Absolute 0.01 x10 3/uL      Absolute NRBC 0.00 x10 3/uL     Basic Metabolic Panel [[id number]] Collected: 07/17/21 0443    Specimen: Blood Updated: 07/17/21 0459    Basic Metabolic Panel [[id number]] Collected: 07/17/21 0443    Specimen: Blood Updated: 07/17/21 0444              Assessment:   30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 Currently recovering well.    Plan:   Plan for SBFT today.  Diet: NPO until results of SBFT.  Encourage ambulation/incentive spirometer and fluids  Continue home medications. Will transition to PO once able to tolerate.  DVT prophylaxis  Discharge pending SBFT and progression  of clinical course.    Florina Ou, MD  General Surgery Resident

## 2021-07-17 NOTE — Plan of Care (Addendum)
Pt is A&Ox4, on RA, VSS, surgical site on abdomen CDI. Pt voiding adequately and ambulating in room. NG tube in place at 50 cm at LIS with 100 output this shift. Bed is in lowest position, table and call light within reach and bed alarm on.      Problem: Safety  Goal: Patient will be free from injury during hospitalization  Outcome: Progressing  Flowsheets (Taken 07/17/2021 0321)  Patient will be free from injury during hospitalization:   Provide and maintain safe environment   Use appropriate transfer methods   Ensure appropriate safety devices are available at the bedside   Include patient/ family/ care giver in decisions related to safety   Assess patient's risk for falls and implement fall prevention plan of care per policy  Goal: Patient will be free from infection during hospitalization  Outcome: Progressing  Flowsheets (Taken 07/17/2021 0321)  Free from Infection during hospitalization:   Assess and monitor for signs and symptoms of infection   Monitor lab/diagnostic results   Monitor all insertion sites (i.e. indwelling lines, tubes, urinary catheters, and drains)   Encourage patient and family to use good hand hygiene technique     Problem: Pain  Goal: Pain at adequate level as identified by patient  Outcome: Progressing  Flowsheets (Taken 07/17/2021 0321)  Pain at adequate level as identified by patient:   Identify patient comfort function goal   Assess for risk of opioid induced respiratory depression, including snoring/sleep apnea. Alert healthcare team of risk factors identified.   Assess pain on admission, during daily assessment and/or before any "as needed" intervention(s)   Reassess pain within 30-60 minutes of any procedure/intervention, per Pain Assessment, Intervention, Reassessment (AIR) Cycle   Evaluate if patient comfort function goal is met   Evaluate patient's satisfaction with pain management progress   Offer non-pharmacological pain management interventions     Problem: Side Effects from Pain  Analgesia  Goal: Patient will experience minimal side effects of analgesic therapy  Outcome: Progressing  Flowsheets (Taken 07/17/2021 0321)  Patient will experience minimal side effects of analgesic therapy:   Monitor/assess patient's respiratory status (RR depth, effort, breath sounds)   Assess for changes in cognitive function   Prevent/manage side effects per LIP orders (i.e. nausea, vomiting, pruritus, constipation, urinary retention, etc.)     Problem: Discharge Barriers  Goal: Patient will be discharged home or other facility with appropriate resources  Outcome: Progressing  Flowsheets (Taken 07/17/2021 0321)  Discharge to home or other facility with appropriate resources: Provide appropriate patient education     Problem: Psychosocial and Spiritual Needs  Goal: Demonstrates ability to cope with hospitalization/illness  Outcome: Progressing  Flowsheets (Taken 07/17/2021 0321)  Demonstrates ability to cope with hospitalizations/illness: Provide quiet environment     Problem: Altered GI Function  Goal: Fluid and electrolyte balance are achieved/maintained  Outcome: Progressing  Flowsheets (Taken 07/17/2021 0321)  Fluid and electrolyte balance are achieved/maintained: Monitor intake and output every shift  Goal: Elimination patterns are normal or improving  Outcome: Progressing  Goal: Nutritional intake is adequate  Outcome: Progressing  Flowsheets (Taken 07/17/2021 0321)  Nutritional intake is adequate: Allow adequate time for meals  Goal: Mobility/Activity is maintained at optimal level for patient  Outcome: Progressing  Flowsheets (Taken 07/17/2021 0321)  Mobility/activity is maintained at optimal level for patient: Encourage independent activity per ability  Goal: No bleeding  Outcome: Progressing  Flowsheets (Taken 07/17/2021 0321)  No bleeding:   Monitor and assess vitals and hemodynamic parameters   Monitor/assess  lab values and report abnormal values     Problem: Moderate/High Fall Risk Score >5  Goal: Patient  will remain free of falls  Outcome: Progressing  Flowsheets (Taken 07/17/2021 0321)  High (Greater than 13): HIGH-Bed alarm on at all times while patient in bed

## 2021-07-17 NOTE — Progress Notes (Addendum)
Pt discharged to home. Discharge instructions given to pt at bedside. Prescribed meds delivered by pharmacy. Pt knows when and how to take all medications as prescribed. All questions were answered and pt knows where to call with any further concerns. IV removed with catheter intact.   Discharge orders complete. Pt discharged with all personal belongings. Pt transported out of unit via wheelchair transport in stable condition.

## 2021-07-17 NOTE — Progress Notes (Signed)
[facility]  HOSPITALIST  PROGRESS NOTE      Patient: Amber [name]  Date: 07/17/2021   LOS: 1 Days  Admission Date: 07/16/2021   MRN: [id number]  Attending: Ronney Lion MD     ASSESSMENT/PLAN     Amber [name] is a 70 y.o. female admitted with       1. SBO  Underwent laparoscopy 9/14. There was something (food) intraluminal at the JJ level which was soft and was able to be pushed through the JJ further down.  Upon pushing this down the proximal JJ decompressed   SBFT today showed minimally distended small bowel with minimally delayed passage, but no obstruction. Contrast reaches the colon by approximately 120 minutes.  NGT removed. started on clear liquid diet which she tolerated so advanced to FLD.  IVF  Pain meds    2. HTN  IV lopressor d/c   Restarted home BP meds losartan and atenolol     3. Hx Chronic L Knee Pain  Oxycodone prn      4. GERD:   On pepcid     Analgesia: oxycodone prn     Nutrition: FLD     Safety Checklist  DVT prophylaxis: Chemical   Foley: Not present   IVs:  Peripheral IV   PT/OT: Not needed   Daily CBC & or Chem ordered: Yes, due to clinical and lab instability       Patient Lines/Drains/Airways Status       Active PICC Line / CVC Line / PIV Line / Drain / Airway / Intraosseous Line / Epidural Line / ART Line / Line / Wound / Pressure Ulcer / NG/OG Tube       Name Placement date Placement time Site Days    Peripheral IV 07/16/21 Right Antecubital 07/16/21  0000  Antecubital  1    Wound 09/01/18 Surgical Incision Abdomen Other (Comment) 09/01/18  1430  Abdomen  1050    Wound 07/16/21 Surgical Incision Abdomen Other (Comment) Dermabond 07/16/21  1457  Abdomen  1    NG/OG Tube 16 Fr. Right nostril 07/16/21  1900  Right nostril  less than 1                    MD/RN rounds: yes        Code Status: FULL CODE     DISPO: continue inpatient care. If tolerating diet, possible d/c tomorrow    Family Contact: per chart     Care Plan discussed with nursing, consultants, case manager.       SUBJECTIVE      Amber [name] doing well. No pain. Had NGT removed this morning and started on clear liquid diet.    MEDICATIONS     Current Facility-Administered Medications   Medication Dose Route Frequency    acetaminophen  650 mg Oral Q6H    atenolol  50 mg Oral Q12H SCH    enoxaparin  30 mg Subcutaneous Q24H SCH    famotidine  20 mg Oral Daily    gabapentin  300 mg Oral Q8H SCH    losartan  100 mg Oral Daily       ROS     Remainder of 10 point ROS as above or otherwise negative    PHYSICAL EXAM     Vitals:    07/17/21 1142   BP: 145/86   Pulse: 89   Resp:    Temp: 97.9 F (36.6 C)   SpO2: 97%  Temperature: Temp  Min: 97.9 F (36.6 C)  Max: 99 F (37.2 C)  Pulse: Pulse  Min: 71  Max: 89  Respiratory: Resp  Min: 14  Max: 21  Non-Invasive BP: BP  Min: 124/74  Max: 182/96  Pulse Oximetry SpO2  Min: 95 %  Max: 100 %    Intake and Output Summary (Last 24 hours) at Date Time    Intake/Output Summary (Last 24 hours) at 07/17/2021 1613  Last data filed at 07/17/2021 1142  Gross per 24 hour   Intake 1391.67 ml   Output 800 ml   Net 591.67 ml       GEN APPEARANCE: Normal;  A&OX3  HEENT: PERLA; EOMI; Conjunctiva Clear  NECK: Supple; No bruits  CVS: RRR, S1, S2; No M/G/R  LUNGS: CTAB; No Wheezes; No Rhonchi: No rales  ABD: soft, +BS, ND   EXT: No edema; Pulses 2+ and intact, normal capillary refill  SKIN: No rash or Lesions  NEURO: CN 2-12 intact; No Focal neurological deficits      LABS     Recent Labs   Lab 07/17/21  0443 07/16/21  0058   WBC 4.30 6.43   RBC 4.10 4.93   Hgb 11.9 13.6   Hematocrit 35.4 42.8   MCV 86.3 86.8   Platelets 194 294       Recent Labs   Lab 07/17/21  0443 07/16/21  0058   Sodium 139 143   Potassium 4.1 4.0   Chloride 106 106   CO2 23 25   BUN 7.0 9.0   Creatinine 0.6 0.7   Glucose 113* 110*   Calcium 8.6 9.4       Recent Labs   Lab 07/16/21  0058   ALT 10   AST (SGOT) 16   Bilirubin, Total 0.5   Bilirubin Direct 0.2   Albumin 4.0   Alkaline Phosphatase 108                   Microbiology Results (last  15 days)       Procedure Component Value Units Date/Time    COVID-19 (SARS-CoV-2) (ID Now) [[id number]] Collected: 07/16/21 0259    Order Status: Completed Specimen: Nasopharyngeal Swab from Nasopharynx Updated: 07/16/21 0322     Purpose of COVID testing Diagnostic -PUI     SARS-CoV-2 Specimen Source Nasopharyngeal     SARS CoV-2 Negative     Comment: Test performed using the Abbott ID NOW EUA assay.  Please see Fact Sheets for patients and providers located at:  http://www.rice.biz/    This test is for the qualitative detection of SARS-CoV-2  (COVID19) nucleic acid. Viral nucleic acids may persist in vivo,  independent of viability. Detection of viral nucleic acid does  not imply the presence of infectious virus, or that virus  nucleic acid is the cause of clinical symptoms. Negative  results should be treated as presumptive and, if inconsistent  with clinical signs and symptoms or necessary for patient  management, should be tested with an alternative molecular  assay. Negative results do not preclude SARS-CoV-2 infection  and should not be used as the sole basis for patient  management decisions. Invalid results may be due to inhibiting  substances in the specimen and recollection should occur.         Narrative:      o Collect and clearly label specimen type:  o Upper respiratory specimen: One Nasopharyngeal Dry Swab NO  Transport Media.  o Hand deliver to laboratory  ASAP  Diagnostic -PUI             RADIOLOGY     Radiological Procedure xray personally reviewed and concur with radiologist reports unless stated otherwise.    FL Sml Intest Sgl Contrast Study   Final Result      Minimally distended small bowel with minimally delayed passage, but no   obstruction. Contrast reaches the colon by approximately 120 minutes.      Wilmon Pali, MD    07/17/2021 12:12 PM          Signed,  Ronney Lion, MD   4:13 PM 07/17/2021

## 2021-07-17 NOTE — UM Notes (Signed)
** This review is compiled from documentation provided by the treatment team within the patient's medical record. Krystal Eaton, RN, BSN, ACM  Clinical Case Manager - Utilization Review    [facility]   [address]  [address]  [place], [place], [address]    NPI: [id number]  Tax ID: [id number]  Fax Number: [phone]  Insurance Line [place]: [phone]  Confidential VM: [phone]    Please use fax number or insurance line to provide authorization for hospital services or to request additional information.         9/14-9/15      9/14 Direct Admit    70 y.o. 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 Patient transferred here for further management      70 y.o. female admitted under  INPATIENT with SBO. I expect an inpatient to remain in the hospital for more than 2 midnights due to need for surgery  I started evaluating the patient at 10 am on 07/16/2021     1. SBO:  NPO, NG tube to lower intermittent suction, Plan for surgery with bariatric team later today, IVF, Morphine IV PRN     2. HTN  Hold orals  Place on telemetry, IV Lopressor q6HR  May allergies to antihypertensives  Consider clonidine patch if BP Not controlled     3. Hx Chronic L Knee Pain  Oxycodone held, on Morphine IV PRN      4. GERD:   IV PPI daily         Surgery Consult:    Assessment:   History of Roux-en-Y gastric bypass 2016  Small bowel obstruction     Plan:   N.p.o, IV fluids, NG tube to low her sensation is intact  CT results and images were reviewed patient reevaluated as she likely will need patient has a small bowel obstruction and will need to go to complete emergently on the posterior to the operating room to relieve an obstruction.  All risks of the procedure were explained to the patient including bleeding, infection,  bowel injury, surrounding organ injury, possibly conversion open procedure, DVT, PE, pneumonia, prolonged intubation possibility of stroke, myocardial infarction, and etc.  Patient opted proceed with surgery after all her questions were answered.  Informed consent will be signed by the patient.          To OR:    07/16/2021  2:29 PM LAPAROSCOPY, DIAGNOSTIC    Pourshojae, Hamid R, DO   Celene Skeen, MD   Gaylene Brooks, MD   Champ Mungo I, MD          Complications:   None     Findings:  Dilated Roux limb up to the JJ with what seems to be a food bezoar that was pushed through the JJ.        To surgery floor post procedure        dextrose 5 % and 0.45 % NaCl with KCl 20 mEq/L infusion  Rate: 100 mL/hr Freq: Continuous Route: IV  Start: 07/16/21 1745      morphine injection 4 mg  Dose: 4 mg  Freq: Every 4 hours PRN Route: IV  PRN Reason: severe pain  Start: 07/16/21 1148  Given x2            --------  Clinical Update 9/15:          Per Sugery:    Subjective:   Feeling well. Pain is under control on current management. No vomiting but does endorse intermittent nausea. Has not passed flatus yet. Ambulating and voiding well.  Main complaint is throat discomfort from NGT, and feeling of mucous in back of throat.  NGT with output.        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 Currently recovering well.     Plan:   Plan for SBFT today.  Diet: NPO until results of SBFT.  Encourage ambulation/incentive spirometer and fluids  Continue home medications. Will transition to PO once able to tolerate.  DVT prophylaxis  Discharge pending SBFT and progression of clinical course.      Vitals:    07/16/21 1939 07/17/21 0014 07/17/21 0349 07/17/21 0608    BP: 178/83 124/74 133/77 161/83   Pulse: 78 82 71 73   Resp:  18 18    Temp: 99 F (37.2 C) 98.4 F (36.9 C) 98.4 F (36.9 C)    TempSrc: Oral Oral Oral    SpO2: 100% 97% 99%    Weight:       Height:             Scheduled Meds:  Current Facility-Administered Medications   Medication Dose Route Frequency    enoxaparin  30 mg Subcutaneous Q24H SCH    metoprolol tartrate  5 mg Intravenous 4 times per day    pantoprazole  40 mg Intravenous Daily     Continuous Infusions:   dextrose 5 % and 0.45 % NaCl with KCl 20 mEq 100 mL/hr at 07/17/21 0353     PRN Meds:  morphine injection 4 mg  Dose: 4 mg  Freq: Every 4 hours PRN Route: IV  PRN Reason: severe pain  Start: 07/16/21 1148  Given x1

## 2021-07-17 NOTE — Progress Notes (Signed)
Nutrition Assessment    JAMELYN BOVARD 70 y.o. female   MRN: [id number]      Reason for Assessment: MST +2    Nutrition Recommendation:   1)  Monitor GI function and tolerance of diet   2)  Add ensure max BID- OK to give despite being on clear liquid diet per MD.   3) Follow for diet to be advanced beyond clear liquids as medically feasible per surgery   4)  Follow wt trends   5)  Recommends daily MVI (consider chewable or bariatric specific MVI- defer to MD discretion)      ______________________________________________________________________      Assessment Data:  Summary:   70 yo female, admitted with n/v, abdominal pain- SBO on CT per MD note.  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 Noted NGT removed and diet now advanced to clear liquids.  Reporting prior to Tuesday was taking regular po intakes with normal appetite (has to eat small meals every 4 hours given gastric bypass hx).  +N/v prior to admit.  +Diarrhea today.  Does report intermittent issues with dumping syndrome depending on what she eats/drinks (must limit carb/sugar intakes and continues to separate liquids from meal times).  UBW reported between 135-140#.  This correlates with wt hx on file  Encouraged follow up with outpatient, bariatric surgery clinic RD.         Adm dx:  Small bowel obstruction due to adhesions   Patient Active Problem List   Diagnosis    Essential hypertension    Anxiety state, unspecified    Abnormal electrocardiogram    Cerebral infarction    Gastroesophageal reflux disease    Hypernatremia    Paresthesia    Slow transit constipation    Vitamin D deficiency    Overactive bladder    S/P laparoscopic cholecystectomy    Transient cerebral ischemia, unspecified type    Palpitations    Pituitary microadenoma    Dyslipidemia    Other  long term (current) drug therapy    Headache    History of spinal surgery    Obstructive sleep apnea syndrome    Hiatal hernia    Osteoarthritis of knees, bilateral    Bariatric surgery status    Nutritional deficiency    Anxiety    Epigastric abdominal tenderness without rebound tenderness    Encounter for vitamin deficiency screening    Nausea    Other dysphagia    Small bowel obstruction due to adhesions    Allergic rhinitis    Chronic frontal sinusitis    Chronic sphenoidal sinusitis    Sinusitis    Primary osteoarthritis of both knees    Labile hypertension    Postsurgical dumping syndrome    Hypercholesterolemia    History of chronic sinusitis    Mild intermittent asthma without complication    Small bowel obstruction       PMH:  has a past medical history of Asthma, Bronchitis (01/02/2019), Chest pain (2016), Claustrophobia, Constipation, Genital herpes, GERD (gastroesophageal reflux disease), Hiatal hernia, Hyperlipidemia, Hypertensive disorder, Low back pain, Nausea without vomiting, OSA on CPAP (2015), Osteoarthritis of knees, bilateral, SBO (small bowel obstruction) (09/2018), and TIA (transient ischemic attack) (2014).  Gastric bypass in 2016 per patient    Recent Labs   Lab 07/17/21  8295 07/16/21  0058   Sodium 139 143   Potassium 4.1 4.0   Chloride 106 106   CO2 23 25   BUN 7.0 9.0   Creatinine 0.6 0.7   Glucose 113* 110*   Calcium 8.6 9.4   EGFR >60.0 >60.0   WBC 4.30 6.43   Hematocrit 35.4 42.8   Hgb 11.9 13.6   Lipase  --  8   Bilirubin, Total  --  0.5   Bilirubin Direct  --  0.2   Bilirubin Indirect  --  0.3   AST (SGOT)  --  16   ALT  --  10   Alkaline Phosphatase  --  108             Current Facility-Administered Medications   Medication Dose Route Frequency    acetaminophen  650 mg Oral Q6H    atenolol  50 mg Oral Q12H SCH    enoxaparin  30 mg Subcutaneous Q24H SCH    famotidine  20 mg Oral Daily    gabapentin  300 mg Oral Q8H SCH    losartan  100 mg Oral Daily      dextrose 5 % and 0.45 % NaCl  with KCl 20 mEq 50 mL/hr at 07/17/21 1052     PRN meds given in the past 48 hours: zofran    Intake History: net +1.5L    Orders Placed This Encounter      Diet clear liquid    Orders Placed This Encounter   Procedures    Diet clear liquid       Food intake: will monitor %Po intakes as diet advanced    Allergies   Allergen Reactions    Acyclovir Nausea And Vomiting     Other reaction(s): gi distress    Amlodipine      nausea    Amoxicillin-Pot Clavulanate      Other reaction(s): gi distress      Atorvastatin        Palpitation    Carvedilol      Extreme fatigue    Hydralazine      Headache      Lisinopril      nausea    Lovastatin Nausea And Vomiting     Other reaction(s): gi distress    Metronidazole Nausea And Vomiting     Other reaction(s): gi distress    Moxifloxacin Nausea And Vomiting         GI symptoms    Rosuvastatin      Palpitation, chest pain, abd pain     Statins      Chest pain, palpitations.    Sulfa Antibiotics Nausea And Vomiting     Other reaction(s): gi distress       Nutrition Focused Physical Exam:  Head: temple region: slight depression with decrease in muscle tone/resistance (mild muscle loss - temporalis), orbital region: slightly bulged fat pads, ample bounce back, fluid retention may mask loss, dehydration may falsely appear as loss (no wasting observed), and buccal region: full, round, filled-out cheeks, ample bounce back of fat pads (no wasting observed)  Upper Body - no overt s/s subcutaneous muscle or fat loss  Lower Body - no s/s subcutaneous fat or muscle loss  Edema - none noted on NFPE  Skin - surgical incision  GI function - +BM (diarrhea)    Learning Needs: Encouraged follow up with bariatric surgery clinic RD given ongoing issues with dumping.    Anthropometrics  Height: 155 cm (  5' 1.02")  Weight: 63 kg (139 lb)  Weight Change: -0.71  BMI (calculated): 26.3    Weight Monitoring 01/20/2021 01/22/2021 02/11/2021 04/28/2021 06/12/2021 06/18/2021 07/16/2021 07/16/2021 07/16/2021   Weight  63.504 kg 63.957 kg 63.504 kg 60.328 kg 59.875 kg 63.231 kg 63.05 kg 63.5 kg 63.05 kg   Weight Method Stated      Stated Standing Scale    Wt appears stable for the last 6 months.    Total Daily Energy Needs: 1260 to 1575 kcal  Method for Calculating Energy Needs: 20 kcal - 25 kcal per kg  at 63 kg (Actual body weight)  Rationale: overwt, non-critical    Total Daily Protein Needs: 81.9 to 94.5 g  Method for Calculating Protein Needs: 1.3 g - 1.5 g per kg at 63 kg (Actual body weight)  Rationale: overwt, non-critical    Total Daily Fluid Needs: 1260 to 1575 ml  Method for Calculating Fluid Needs: 1 ml per kcal energy = 1260 to 1575 kcal  Rationale: Defer additional hydration per MD discretion (above is standard hydration for BMI/age)    Nutrition Diagnosis:   Inadequate Protein-energy intake related to altered GI tract as evidenced by clear liquid diet, reports of diarrhea and dumping syndrome      Intervention:  Goal: Meet >/=75%   Plan:   1)  Monitor GI function and tolerance of diet   2)  Add ensure max BID- OK to give despite being on clear liquid diet per MD.   3) Follow for diet to be advanced beyond clear liquids   4)  Follow wt trends   5)  Recommends daily MVI (consider chewable or bariatric specific MVI)    Monitoring/Evaluation:   PO intake  Weights  GI symptoms      Novella Olive, RD  PRN Clinical Dietitian

## 2021-07-17 NOTE — Anesthesia Postprocedure Evaluation (Signed)
Anesthesia Post Evaluation    Patient: Amber [name]    Procedure(s):  LAPAROSCOPY, DIAGNOSTIC    Anesthesia type: general    Last Vitals:   Vitals Value Taken Time   BP 177/80 07/16/21 1640   Temp 36.2 C (97.2 F) 07/16/21 1605   Pulse 78 07/16/21 1650   Resp 14 07/16/21 1650   SpO2 95 % 07/16/21 1650                 Anesthesia Post Evaluation:     Patient Evaluated: PACU  Patient Participation: complete - patient participated  Level of Consciousness: awake  Pain Score: 1  Pain Management: adequate  Multimodal analgesia pain management approach    Airway Patency: patent    Anesthetic complications: No      PONV Status: none    Cardiovascular status: acceptable  Respiratory status: acceptable  Hydration status: acceptable        Signed by: Daine Gip, MD, 07/17/2021 1:59 PM

## 2021-07-17 NOTE — Discharge Summary (Shared)
Discharge Summary    Admit Date: 07/16/2021   Discharge Date: 9/15***/2022    Attending: Nicola Police, DO   Service: SURGERY    Consults:  None     Procedures:  9/14: Diagnostic laparoscopy    Admission Diagnoses:   SBO (small bowel obstruction) [K56.609]  Small bowel obstruction [K56.609]    Hospital Diagnoses/Problems:   Patient Active Problem List    Diagnosis Date Noted    Small bowel obstruction 07/16/2021    Mild intermittent asthma without complication 10/15/2020    History of chronic sinusitis 07/16/2020    Postsurgical dumping syndrome 09/07/2019    Labile hypertension 07/24/2019    Allergic rhinitis 11/28/2018    Chronic frontal sinusitis 11/28/2018    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    Sinusitis 11/28/2018    Primary osteoarthritis of both knees 11/28/2018    Small bowel obstruction due to adhesions 09/01/2018    Other dysphagia 08/26/2018    Encounter for vitamin deficiency screening 08/10/2018    Nausea 08/10/2018    Epigastric abdominal tenderness without rebound tenderness 09/08/2016    Anxiety 06/25/2016    Nutritional deficiency 04/17/2016    Bariatric surgery status 10/23/2015    Hiatal hernia 09/20/2015    Osteoarthritis of knees, bilateral 09/20/2015    Headache 07/12/2015    Dyslipidemia 03/21/2015    Palpitations 02/12/2015    Transient cerebral ischemia, unspecified type 11/07/2014    S/P laparoscopic cholecystectomy 08/27/2014    Other long term (current) drug therapy 08/12/2014    Slow transit constipation 07/27/2014    Obstructive sleep apnea syndrome 05/01/2014    Anxiety state, unspecified 03/01/2014    Vitamin D deficiency 02/19/2014    Overactive bladder 02/06/2014    Abnormal electrocardiogram 01/08/2014    Essential hypertension 11/21/2013    History of spinal surgery 11/21/2013    Hypernatremia 11/11/2013    Paresthesia 11/11/2013    Cerebral infarction 04/15/2013    Gastroesophageal reflux disease 04/02/2013    Pituitary microadenoma 11/02/2004     Overview:    Prolactinoma, pituitary microadenoma.   Corpus callosum lipoma  See 08/25/11 hospital admit, CT, MRI, neurology and neurosurgery consult.  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 The carpus callosum is  only mildly hypoplastic along with superior border. Pericallosal  lipomas are rare and typically discovered incidentally on  examination. No specific treatment is typically required. This  was discussed with Dr. Kaylyn Lim, [facility] neurologist, who  suggested that the patient should be seen by neurosurgeon. A  neurosurgery evaluation was requested and they suggested that  there is no need for any intervention at this time"      Hypercholesterolemia 11/02/2004     Formatting of this note might be different from the original.    Mevacor (Lovastatin)  GI Symptoms      Zocor (Simvastatin)  GI Symptoms    Will offer Pravachol  LDL goal <160       Indication for Admission/HPI:  Concerns for small bowel obstruction in setting of history of gastric bypass.  Amber [name] is a 79F with a history of gastric bypass in 2016 who presented to an OSH with abdominal pain with nausea and vomiting. Of note, in 2020 she had an SBO which required diagnostic laparoscopy with lysis of adhesions. A CT scan at the OSH demonstrated severe distension of the roux limb with associated mesenteric edema. She was transferred to [facility] for further care.    Hospital Course:  Patient was evaluated in the ER and her CT imaging was reviewed. She was taken emergently to the OR for diagnostic laparoscopy. In the OR, dilation of the roux limb was noted near her JJ with a soft intraluminal mass, presumed to be a possible food bezoar, which was able to be pushed through the JJ with decompression of the bowel subsequently. The remainder of the bowel was without any abnormality, and both the false Peterson's space and JJ mesenteric defect were noted to be completely closed. Her NGT was confirmed to be in good  position intraoperatively.  Post operatively the patient was transferred from the PACU to the surgical floor. She underwent a SBFT on POD#1 which demonstrated no bowel obstruction. She tolerated Full liquid diet and patient was discharged home on post operative day 1.    At the time of discharge the patient was afebrile and vital signs were within normal limits. Ambulatory and able to void spontaneously without any difficulty. Tolerating a diet and pain was well-controlled with oral medication. Stable and ready for discharge.    Significant Diagnostic Studies:  Results       Procedure Component Value Units Date/Time    CBC and differential [[id number]] Collected: 07/17/21 0443    Specimen: Blood Updated: 07/17/21 0507     WBC 4.30 x10 3/uL      Hgb 11.9 g/dL      Hematocrit 40.9 %      Platelets 194 x10 3/uL      RBC 4.10 x10 6/uL      MCV 86.3 fL      MCH 29.0 pg      MCHC 33.6 g/dL      RDW 15 %      MPV 9.0 fL      Neutrophils 74.7 %      Lymphocytes Automated 13.3 %      Monocytes 11.4 %      Eosinophils Automated 0.2 %      Basophils Automated 0.2 %      Immature Granulocytes 0.2 %      Nucleated RBC 0.0 /100 WBC      Neutrophils Absolute 3.21 x10 3/uL      Lymphocytes Absolute Automated 0.57 x10 3/uL      Monocytes Absolute Automated 0.49 x10 3/uL      Eosinophils Absolute Automated 0.01 x10 3/uL      Basophils Absolute Automated 0.01 x10 3/uL      Immature Granulocytes Absolute 0.01 x10 3/uL      Absolute NRBC 0.00 x10 3/uL     Basic Metabolic Panel [[id number]] Collected: 07/17/21 0443    Specimen: Blood Updated: 07/17/21 0459    Basic Metabolic Panel [[id number]] Collected: 07/17/21 0443    Specimen: Blood Updated: 07/17/21 0444          No results found.    FL Sml Intest Sgl Contrast Study    Result Date: 07/17/2021  Minimally distended small bowel with minimally delayed passage, but no obstruction. Contrast reaches the colon by approximately 120 minutes. Wilmon Pali, MD  07/17/2021 12:12 PM      Treatments:   IV hydration, analgesia: multimodal, anticoagulation: LMW heparin and surgery    Discharge Exam:  General appearance: Well developed, well nourished, appears stated age and in NAD  Eyes: Sclera anicteric, pink conjunctivae, no ptosis  ENMT: mucous membranes moist, nose and ears appear normal.  Oropharynx clear.  Chest: Non labored respirations, no audible wheezing, no clubbing or cyanosis  CV:  Regular  rate and rhythm, no JVD, no LE edema  Abdomen: soft, appropriately tender, non-distended, incisions cdi w/ surgical glue  Skin: Normal color and turgor, no rashes, no suspicious skin lesions noted  Neuro: No gross movement disorders noted.  Mental status: Appropriate affect, alert and oriented x 3    Disposition: Admitted as an Inpatient going home    Discharge Medications:     Medication List        START taking these medications      docusate sodium 100 MG capsule  Commonly known as: COLACE  Take 1 capsule (100 mg total) by mouth 2 (two) times daily as needed for Constipation     naloxone 4 MG/0.1ML nasal spray  Commonly known as: NARCAN  1 spray intranasally. If pt does not respond or relapses into respiratory depression call 911. Give additional doses every 2-3 min.     oxyCODONE 5 MG/5ML solution  Commonly known as: ROXICODONE  Take 5 mLs (5 mg total) by mouth every 4 (four) hours as needed for Pain            CONTINUE taking these medications      aspirin EC 81 MG EC tablet  Take 1 tablet (81 mg total) by mouth daily.     atenolol 50 MG tablet  Commonly known as: TENORMIN  TAKE 1 TABLET(50 MG) BY MOUTH TWICE DAILY     B-12 IJ     benzonatate 100 MG capsule  Commonly known as: TESSALON  Take 1 capsule (100 mg total) by mouth 3 (three) times daily as needed for Cough     diazePAM 5 MG tablet  Commonly known as: VALIUM  Take 1 tablet (5 mg total) by mouth every 8 (eight) hours as needed for Anxiety     famotidine 20 MG tablet  Commonly known as: PEPCID  Take 1 tablet (20 mg total) by mouth daily     lidocaine 2 %  jelly  Commonly known as: XYLOCAINE  Apply topically as needed (sore)     losartan 100 MG tablet  Commonly known as: COZAAR  Take 1 tablet (100 mg total) by mouth daily     multivitamin capsule     ondansetron 4 MG disintegrating tablet  Commonly known as: ZOFRAN-ODT  Take 1 tablet (4 mg total) by mouth every 8 (eight) hours as needed for Nausea     oxyCODONE-acetaminophen 5-325 MG per tablet  Commonly known as: PERCOCET  Take one to two tablets po bid as needed for pain     pantoprazole 40 MG tablet  Commonly known as: PROTONIX  Take 1 tablet (40 mg total) by mouth daily     traMADol 50 MG tablet  Commonly known as: ULTRAM     valACYclovir HCL 500 MG tablet  Commonly known as: VALTREX  TAKE 1 TABLET(500 MG) BY MOUTH DAILY            ASK your doctor about these medications      fluticasone 50 MCG/ACT nasal spray  Commonly known as: Flonase  2 sprays by Nasal route daily.     vitamin D (ergocalciferol) 50000 UNIT Caps  Commonly known as: DRISDOL  Take 1 capsule (50,000 Units total) by mouth once a week               Where to Get Your Medications        These medications were sent to [facility]  [address], [place] [address]  Hours: Monday - Friday 9AM to 6PM, Saturday 9AM to 3PM, closed Sunday Phone: [phone]   docusate sodium 100 MG capsule  naloxone 4 MG/0.1ML nasal spray  oxyCODONE 5 MG/5ML solution         Patient Instructions:   Activity: no driving on opioid medications, no heavy lifting (more than 15lb) for 6 weeks  Diet: bariatric diet  Wound Care: keep incisions clean and dry    Follow-up with Bariatric surgery in 2 weeks.

## 2021-07-17 NOTE — Progress Notes (Signed)
Pt interviewed and denies any anesthetic complications  except for sore throat & pt informed that this may last a few days.  Voiding without difficulty.

## 2021-07-21 ENCOUNTER — Telehealth (INDEPENDENT_AMBULATORY_CARE_PROVIDER_SITE_OTHER): Payer: Self-pay

## 2021-07-21 NOTE — Telephone Encounter (Signed)
Pt called stating nausea for past 2 days and needs to take b/p medicine 3x/day and c/o nausea.  Messaged Dr. Jeanell Sparrow and he said she could advance to mushy diet.  Sent patient mushy diet information to her email.

## 2021-07-28 ENCOUNTER — Telehealth (HOSPITAL_COMMUNITY): Payer: Self-pay

## 2021-07-28 NOTE — Telephone Encounter (Signed)
Updated med list per phone interview with patient

## 2021-07-29 ENCOUNTER — Ambulatory Visit: Payer: No Typology Code available for payment source | Attending: Gastroenterology

## 2021-07-29 ENCOUNTER — Ambulatory Visit (INDEPENDENT_AMBULATORY_CARE_PROVIDER_SITE_OTHER): Payer: Medicare Other | Admitting: Surgery

## 2021-07-29 DIAGNOSIS — Z800 Family history of malignant neoplasm of digestive organs: Secondary | ICD-10-CM | POA: Insufficient documentation

## 2021-07-29 DIAGNOSIS — Z20822 Contact with and (suspected) exposure to covid-19: Secondary | ICD-10-CM | POA: Insufficient documentation

## 2021-07-29 LAB — COVID-19 CORONAVIRUS QUALITATIVE PCR: COVID-19 Coronavirus Qual PCR Result: NOT DETECTED

## 2021-07-29 NOTE — Progress Notes (Signed)
Patient was seen on 07/29/2021 at the [facility] where a sample was taken from anterior nares.  Test results will be available in Patient chart within 48 hours.    The specimen was collected by: Adelfa Koh, LPN

## 2021-07-30 ENCOUNTER — Encounter (HOSPITAL_COMMUNITY): Payer: Self-pay | Admitting: Gastroenterology

## 2021-07-30 ENCOUNTER — Encounter (HOSPITAL_COMMUNITY): Payer: Medicare Other | Admitting: Gastroenterology

## 2021-07-30 NOTE — Anesthesia Preprocedure Evaluation (Addendum)
Patient: Jasmine Hunt    Procedure Information     Date/Time: 08/01/21 1000    Scheduled providers: Murvin Donning, MD    Procedure: ENDOSCOPIC ULTRASOUND (UPPER)    Location: [facility]        HPI: Patient is a 70 year old female with history of breast cancer presenting for an upper endoscopic [place] due to BRCA2 gene mutation positivity and family history of pancreatic cancer.     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 ([place])      Genetics   (+) Family history of malignant neoplasm of gastrointestinal tract      Other   (+) BRCA1 gene mutation positive   (+) HTN    Relevant surgical history:   Past Surgical History:   Procedure Laterality Date    BREAST LUMPECTOMY      2001 & 2006    HYSTERECTOMY  2008    SHOULDER SURGERY  2015         Medications:     Outpatient:   Current Outpatient Medications   Medication Instructions    amLODIPine (NORVASC) 5 mg, Oral, Daily    atorvastatin (LIPITOR) 20 mg, Oral, Nightly    cholecalciferol (VITAMIN D-3) 2,000 units, Oral, Daily    Coenzyme Q10 (COQ-10 OR) 1 tablet, Oral, As needed    Cyanocobalamin (VITAMIN B-12 OR) 1 tablet, Oral, As needed    mometasone 50 MCG/ACT nasal spray 2 sprays, Nasal, As needed    Multiple Vitamin (Multi-Vitamin) tablet 1 tablet, Oral, Daily    Omega-3 Fatty Acids (OMEGA 3 OR) 1 tablet, Oral, As needed    OMEPRAZOLE OR 1 tablet, Oral, As needed                Review of patient's allergies indicates:  Allergies   Allergen Reactions    Dust Mite Mixed Allergen Ext [Mite (D. Farinae)] Unknown     Allergy test came back positive for dust, reaction is unknown.       Social History:   Social History     Tobacco Use    Smoking status: Never Smoker    Smokeless tobacco: Never Used   Substance Use Topics    Alcohol use: Not Currently    Drug use: Not Currently       Medical History and Review of Systems  Documentation reviewed: patient health questionnaire and electronic  medical record.    Source of information: Chart review.  Previous anesthesia: Yes   History of anesthetic complications  (-) History of anesthetic complications.  (-) family history of anesthetic complications.      Functional Status   Able to walk 1 city block (200 yards), able to climb 2 flights of stairs or more without stopping, exercise regularly and able to lay flat and still for 30 minutes.   Functional status comments: Exercises 7x/week -walking or stationary bike    Neuro/Psych Neuropathy in bilateral feet    Cardiovascular   (+) hypertension (Last clinic BP 06/23/2021 150/83)    Musculoskeletal   (+) osteoarthritis    GI/Hepatic/Renal   (+) GERD  Oncology   (+) cancer (breast 2001 & 2007)        Vitals 06/23/2021  BP 150/83 Important    Pulse 98  Temp 36.8 C (Oral)  Resp 19  Ht 5' 2"  (1.575 m)  Wt 75 kg (165 lb 6.4 oz)  SpO2 97%  BMI 30.25 kg/m  Physical Exam  Airway  Mallampati:  II  Upper Lip Bite Test: I  TM distance:  >6 cm  Neck ROM:  Full  Mouth Opening:  Normal    Dental  normal      Cardiovascular  normal      Pulmonary  normal             Labs: (last year)    BMP  CBC/Coags   Na 140 06/23/2021  Hb 14.0 06/23/2021   K 3.7 06/23/2021  HCT 42 06/23/2021   Cl 106 06/23/2021  WBC 7.62 06/23/2021   HCO3 25 06/23/2021  PLT 327 06/23/2021   BUN 17 06/23/2021  INR - -   Cr 0.80 06/23/2021  PT - -   Glu 91 06/23/2021  PTT - -       Misc   eGFR >60 06/23/2021  MCV 86 06/23/2021   A1C - -  BNP - -       LFTs   AST 28 06/23/2021  Albumin 4.5 06/23/2021   ALT 25 06/23/2021  Protein 8.0 06/23/2021   Alk Phos 104 06/23/2021  T Bili 0.6 06/23/2021         Relevant procedures / diagnostic studies:     PAT CLINIC DISCUSSION    ANESTHESIA PLAN   Preoperative Discussion:     Discussed Code Status for perioperative care?: No    Informed Consent:     Anesthesia Plan discussed with:        Patient    ASA Score:     ASA: 2  Planned Anesthetic Type:      general      Risk Calculators / Scores:       PONV:2    Nonsmoker  Female

## 2021-07-31 ENCOUNTER — Ambulatory Visit (HOSPITAL_BASED_OUTPATIENT_CLINIC_OR_DEPARTMENT_OTHER): Payer: Self-pay

## 2021-07-31 ENCOUNTER — Encounter (INDEPENDENT_AMBULATORY_CARE_PROVIDER_SITE_OTHER): Payer: Self-pay | Admitting: Surgery

## 2021-07-31 ENCOUNTER — Ambulatory Visit (INDEPENDENT_AMBULATORY_CARE_PROVIDER_SITE_OTHER): Payer: Medicare Other | Admitting: Surgery

## 2021-07-31 ENCOUNTER — Other Ambulatory Visit: Payer: Self-pay

## 2021-07-31 VITALS — BP 142/81 | HR 63 | Temp 97.9°F | Ht 61.0 in | Wt 135.0 lb

## 2021-07-31 DIAGNOSIS — Z9884 Bariatric surgery status: Secondary | ICD-10-CM

## 2021-07-31 DIAGNOSIS — Z9889 Other specified postprocedural states: Secondary | ICD-10-CM | POA: Insufficient documentation

## 2021-07-31 NOTE — Progress Notes (Signed)
07/31/21 1042   Pt Outreach/Care Plan Metrics   Payor Grouping for Outreach MSSP   Care Plan Progress Refused   Outreach Status for Metrics listing Spoke with Member/Care Giver  (Spoke with patient)     Nurse Navigator spoke with patient today. Introduced the care management program explaining services and support. Patient kindly declined at this time. However, patient was provided with Nurse Navigator contact information and asked to call for any future care management needs and support.     Arva Chafe., RN, BSN, CDCES, ACM-RN   Patient Care Nurse Navigator  Ambulatory Care Management   [phone]

## 2021-07-31 NOTE — Progress Notes (Signed)
ASSESSMENT:  Status post diagnostic laparoscopy  Gastric bypass status      PLAN:  Discussed with patient to chew her food really well and eat small bites.  Patient will follow-up with me in 6 months with labs.      SUBJECTIVE:  The patient is a 70 y.o.  female who presents status post diagnostic laparoscopy with partial bowel obstruction caused by food bezoar.  Patient is tolerating diet and having regular bowel movements.  She denies any nausea or vomiting.        The following portions of the patient's history were reviewed and updated as appropriate: allergies, current medications, past family history, past medical history, past social history, past surgical history, and problem list.    CURRENT PROBLEM LIST:   Patient Active Problem List   Diagnosis    Essential hypertension    Anxiety state, unspecified    Abnormal electrocardiogram    Cerebral infarction    Gastroesophageal reflux disease    Hypernatremia    Paresthesia    Slow transit constipation    Vitamin D deficiency    Overactive bladder    S/P laparoscopic cholecystectomy    Transient cerebral ischemia, unspecified type    Palpitations    Pituitary microadenoma    Dyslipidemia    Other long term (current) drug therapy    Headache    History of spinal surgery    Obstructive sleep apnea syndrome    Hiatal hernia    Osteoarthritis of knees, bilateral    S/P gastric bypass    Nutritional deficiency    Anxiety    Epigastric abdominal tenderness without rebound tenderness    Encounter for vitamin deficiency screening    Nausea    Other dysphagia    Small bowel obstruction due to adhesions    Allergic rhinitis    Chronic frontal sinusitis    Chronic sphenoidal sinusitis    Sinusitis    Primary osteoarthritis of both knees    Labile hypertension    Postsurgical dumping syndrome    Hypercholesterolemia    History of chronic sinusitis    Mild intermittent asthma without complication    S/P laparoscopy     PAST MEDICAL HISTORY:   Past Medical History:   Diagnosis  Date    Asthma     Bronchitis 01/02/2019    Chest pain 2016     Chest pain after eating x6  months--being followed by GI for GERD    Claustrophobia     MRIs    Constipation     Genital herpes     no current outbreak (10/03/15)    GERD (gastroesophageal reflux disease)     Hiatal hernia     h/o surgery    Hyperlipidemia     Hypertensive disorder     Well controlled on meds per pt. Elevated with pain.    Low back pain     Nausea without vomiting     OSA on CPAP 2015    CPAP nightly x 1 yr, used nightly.    Osteoarthritis of knees, bilateral     and back    SBO (small bowel obstruction) 09/2018    TIA (transient ischemic attack) 2014    TIA 2014-no residual. Patient evaulated for possible TIA 07/12/2015  @ [facility] (dischage summary in epic)-per summary CT of head was normal and patient was d/c'd     PAST SURGICAL HISTORY:   Past Surgical History:   Procedure Laterality Date    APPENDECTOMY (  OPEN)  >25 yrs    CHOLECYSTECTOMY      EGD  01/2015    EGD N/A 01/23/2019    Procedure: EGD;  Surgeon: Champ Mungo, MD;  Location: [facility];  Service: General;  Laterality: N/A;  ENDOSCOPY  MD REQ=30MINS; Q1=UNK    EGD, BIOPSY N/A 09/15/2017    Procedure: EGD, BIOPSY;  Surgeon: Pershing Proud, MD;  Location: [facility];  Service: Gastroenterology;  Laterality: N/A;    fallopian tube surgery  >25 yrs    INSERTION, PAIN PUMP (MEDICAL) N/A 10/22/2015    Procedure: INSERTION, PAIN PUMP (MEDICAL);  Surgeon: Nicola Police, DO;  Location: [facility];  Service: General;  Laterality: N/A;    JOINT REPLACEMENT      LAPAROSCOPIC, CHOLECYSTECTOMY, CHOLANGIOGRAM  08/13/2014    LAPAROSCOPIC, GASTRIC BYPASS N/A 10/22/2015    Procedure: LAPAROSCOPIC, GASTRIC BYPASS;  Surgeon: Josefa Half R, DO;  Location: [facility];  Service: General;  Laterality: N/A;    LAPAROSCOPIC, HERNIORRHAPHY, HIATAL N/A 10/22/2015    Procedure: LAPAROSCOPIC, HERNIORRHAPHY, HIATAL;  Surgeon: Nicola Police, DO;  Location: [facility];  Service: General;  Laterality: N/A;    LAPAROSCOPIC, LYSIS, ADHESIONS N/A 09/01/2018    Procedure: LAPAROSCOPIC, LYSIS, ADHESIONS RELEASE OF  SMALL BOWEL OBSTRUCTION;  Surgeon: Champ Mungo, MD;  Location: [facility];  Service: General;  Laterality: N/A;    LAPAROSCOPY, DIAGNOSTIC N/A 09/01/2018    Procedure: LAPAROSCOPY, DIAGNOSTIC;  Surgeon: Champ Mungo, MD;  Location: [facility];  Service: General;  Laterality: N/A;    LAPAROSCOPY, DIAGNOSTIC N/A 07/16/2021    Procedure: LAPAROSCOPY, DIAGNOSTIC;  Surgeon: Nicola Police, DO;  Location: [facility];  Service: General;  Laterality: N/A;    OVARY SURGERY Right >25 yrs    REPLACEMENT TOTAL KNEE Left     SPINE SURGERY  11/2013    cervical HNP x 2, Dr. Bufford Buttner    TOOTH EXTRACTION N/A      TOBACCO HISTORY:   Social History     Tobacco Use   Smoking Status Never   Smokeless Tobacco Never     ALCOHOL HISTORY:   Social History     Substance and Sexual Activity   Alcohol Use Not Currently     DRUG HISTORY:   Social History     Substance and Sexual Activity   Drug Use Never     CURRENT OUTPATIENT MEDICATIONS:   Outpatient Medications Marked as Taking for the 07/31/21 encounter (Office Visit) with Jeanise Durfey R, DO   Medication Sig Dispense Refill    aspirin EC 81 MG EC tablet Take 1 tablet (81 mg total) by mouth daily. 30 tablet 1    atenolol (TENORMIN) 50 MG tablet TAKE 1 TABLET(50 MG) BY MOUTH TWICE DAILY 180 tablet 1    benzonatate (TESSALON) 100 MG capsule Take 1 capsule (100 mg total) by mouth 3 (three) times daily as needed for Cough 30 capsule 1    Cyanocobalamin (B-12 IJ) Inject as directed every 30 (thirty) days         diazePAM (VALIUM) 5 MG tablet Take 1 tablet (5 mg total) by mouth every 8 (eight) hours as needed for Anxiety 30 tablet 2    famotidine (PEPCID) 20 MG tablet Take 1 tablet (20 mg total) by mouth daily 90 tablet 1    fluticasone (FLONASE) 50 MCG/ACT nasal spray 2 sprays by Nasal route daily. (Patient taking  differently: 2 sprays by  Nasal route as needed) 16 g 2    lidocaine (XYLOCAINE) 2 % jelly Apply topically as needed (sore) 30 mL 1    losartan (COZAAR) 100 MG tablet Take 1 tablet (100 mg total) by mouth daily 90 tablet 1    Multiple Vitamin (MULTIVITAMIN) capsule Take 1 capsule by mouth daily      ondansetron (ZOFRAN-ODT) 4 MG disintegrating tablet Take 1 tablet (4 mg total) by mouth every 8 (eight) hours as needed for Nausea 30 tablet 2    oxyCODONE-acetaminophen (PERCOCET) 5-325 MG per tablet Take one to two tablets po bid as needed for pain 60 tablet 0    pantoprazole (PROTONIX) 40 MG tablet Take 1 tablet (40 mg total) by mouth daily 90 tablet 1    traMADol (ULTRAM) 50 MG tablet 50 mg      valACYclovir HCL (VALTREX) 500 MG tablet TAKE 1 TABLET(500 MG) BY MOUTH DAILY 90 tablet 1    vitamin D, ergocalciferol, (DRISDOL) 50000 UNIT Cap Take 1 capsule (50,000 Units total) by mouth once a week 12 capsule 1     ALLERGIES:   Allergies   Allergen Reactions    Acyclovir Nausea And Vomiting     Other reaction(s): gi distress    Amlodipine      nausea    Amoxicillin-Pot Clavulanate      Other reaction(s): gi distress      Atorvastatin        Palpitation    Carvedilol      Extreme fatigue    Hydralazine      Headache      Lisinopril      nausea    Lovastatin Nausea And Vomiting     Other reaction(s): gi distress    Metronidazole Nausea And Vomiting     Other reaction(s): gi distress    Moxifloxacin Nausea And Vomiting         GI symptoms    Rosuvastatin      Palpitation, chest pain, abd pain     Statins      Chest pain, palpitations.    Sulfa Antibiotics Nausea And Vomiting     Other reaction(s): gi distress       OBJECTIVE:  BP 142/81    Pulse 63    Temp 97.9 F (36.6 C) (Oral)    Ht 5\' 1"     Wt 135 lb    LMP  (LMP Unknown)    BMI 25.51 kg/m     General appearance: alert, appears stated age, and cooperative  Head: Normocephalic, without obvious abnormality, atraumatic  Abdomen: soft, non-tender; bowel sounds normal; no  masses,  no organomegaly and incisions are clean, dry, and intact      CT Abd/ Pelvis without Contrast    Result Date: 07/16/2021  CT ABDOMEN PELVIS WO IV/ WO PO CONT HISTORY: b/l flank pain R>L n/v. COMPARISON: 12/08/2019. TECHNIQUE: Multiple axial CT images were obtained through the abdomen and pelvis without contrast. Coronal and sagittal reconstructions were performed. One of the following dose optimization techniques was utilized in the performance of this exam: Automated exposure control; adjustment of the mA and/or kV according to the patient's size; or use of an iterative  reconstruction technique.  Specific details can be referenced in the facility's radiology CT exam operational policy. FINDINGS: Lung bases: Clear. Hepatobiliary: Liver cysts are stable. The gallbladder has been removed. The common bile duct is normal in size. Spleen: Normal. Pancreas: Normal. Adrenals/Kidneys: The adrenal glands are normal. Right renal cyst is  unchanged. The left kidney is normal. No stones or hydronephrosis. Bowel/Mesentery/peritoneum: The appendix is not seen. The patient is status post gastric bypass surgery. 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 Transition point appears to be within the central mesentery proximal to the distal anastomosis. No free air. Lymph nodes: No enlarged lymph nodes. Vessels: Mild vascular calcifications are seen. Pelvic GU: The bladder is normal. The uterus is unremarkable. Other: Negative. Musculoskeletal: There are no suspicious bony lesions.     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 Transition point appears to be within the central mesentery proximal to the jejunojejunal anastomosis but is difficult to visualize. Jorene Guest, MD  07/16/2021 2:21 AM    FL Sml Intest Sgl Contrast Study    Result Date: 07/17/2021  HISTORY: Previous gastric bypass. Pain. CT scan demonstrating small  bowel obstruction. COMPARISON: CT scan 07/16/2021. TECHNIQUE: Patient identity was confirmed with name and birth date. The nature of the procedure including risks, alternatives, and indications were discussed. All questions were answered. A scout film was obtained. Sequential images of the abdomen were obtained after 237 cc oral Gastrografin. Fluoroscopy Time: 0 secs Images: 6 FINDINGS: Postsurgical changes are seen from previous gastric bypass. NG tube is seen with tip in the gastric pouch. Mild air distention is seen throughout small bowel. There is scattered stool in the colon. Subsequently, the patient ingested oral Gastrografin without difficulty. The small bowel promptly opacifies. It is minimally distended. There is minimally delayed oral contrast passage, reaching the colon by approximately 120 minutes. There is no discrete transition point from obstruction.     Minimally distended small bowel with minimally delayed passage, but no obstruction. Contrast reaches the colon by approximately 120 minutes. Wilmon Pali, MD  07/17/2021 12:12 PM    Chest AP Portable    Result Date: 07/16/2021  HISTORY:after ngt placement. COMPARISON: 11/26/2020 TECHNIQUE: XR CHEST AP PORTABLE FINDINGS: Lines and tubes: Nasogastric tube is present with the tip in the region of the stomach. Exact location is difficult to determine given the patient's surgical history. Lungs and hila: The lungs are clear. There is no pleural effusion. There is no pneumothorax. Heart and Mediastinum: The cardiac silhouette is normal in size. Aortic calcifications are present. Bones and soft tissues: No acute abnormality. Upper abdomen: Normal     No acute process. Jorene Guest, MD  07/16/2021 4:51 AM      This note was generated by the [facility] EMR system/Dragon speech recognition and may contain inherent errors or omissions not intended by the user. Grammatical errors, random word insertions, deletions, pronoun errors and incomplete sentences are occasional  consequences of this technology due to software limitations. Not all errors are caught or corrected. If there are questions or concerns about the content of this note or information contained within the body of this dictation they should be addressed directly with the author for clarification.      Jeanice Dempsey R. Jullie Arps, DO, FACS, FASMBS, FACOS

## 2021-07-31 NOTE — Telephone Encounter (Signed)
Call Type:  Triage Call    [place] Name:  [place] Name:  [facility] [phone]    Presenting Problem:  Has an endoscopy in the a.m. and never got prep instructions. Did not get email or call with instructions.    Assessment:  01. Symptoms: What are your symptoms? Declines symptoms, declines triage. Information request regarding endoscopy preperation.  02. Onset: When did your symptoms/concern begin? (minutes, hours, days, or months ago?) UTO  03. Action: What actions or treatments have you taken to treat your symptoms/concern? UTO  04. Response: What was the response to your actions taken? UTO  05. Severity: How severe are your symptoms? (ex. interference with normal activities, pain scale, size or appearance of injury) UTO    Triage Note:  Reference used: [email or web address]    Guideline Title:  Information Only Call - No Triage (Adult)    Guideline Question:  [1] Question about upcoming scheduled test, no triage required and triager able to answer question? Yes    Recommended Disposition:  Information or Advice Only Call    Original Inclination:  Addressed problem at home (research, call family)    Intended Action:  Information or Advice Only Call    Care Advice:  Care Advice given per Information Only Call - No Triage (Adult) guideline.  Home Care - Information or Advice Only Call.  Call Back If:  * You have more questions

## 2021-08-01 ENCOUNTER — Encounter (HOSPITAL_COMMUNITY): Payer: Self-pay | Admitting: Gastroenterology

## 2021-08-01 ENCOUNTER — Telehealth (HOSPITAL_BASED_OUTPATIENT_CLINIC_OR_DEPARTMENT_OTHER): Payer: Self-pay | Admitting: Gastroenterology

## 2021-08-01 ENCOUNTER — Ambulatory Visit
Admission: RE | Admit: 2021-08-01 | Discharge: 2021-08-01 | Disposition: A | Discharge status: Home / Self Care | Payer: No Typology Code available for payment source | Attending: Gastroenterology | Admitting: Gastroenterology

## 2021-08-01 ENCOUNTER — Ambulatory Visit (HOSPITAL_BASED_OUTPATIENT_CLINIC_OR_DEPARTMENT_OTHER): Payer: No Typology Code available for payment source

## 2021-08-01 ENCOUNTER — Encounter (HOSPITAL_BASED_OUTPATIENT_CLINIC_OR_DEPARTMENT_OTHER): Payer: Self-pay | Admitting: Gastroenterology

## 2021-08-01 ENCOUNTER — Ambulatory Visit (HOSPITAL_COMMUNITY): Payer: No Typology Code available for payment source

## 2021-08-01 DIAGNOSIS — Z800 Family history of malignant neoplasm of digestive organs: Secondary | ICD-10-CM | POA: Insufficient documentation

## 2021-08-01 DIAGNOSIS — Z1501 Genetic susceptibility to malignant neoplasm of breast: Secondary | ICD-10-CM | POA: Insufficient documentation

## 2021-08-01 DIAGNOSIS — Z1509 Genetic susceptibility to other malignant neoplasm: Secondary | ICD-10-CM | POA: Insufficient documentation

## 2021-08-01 DIAGNOSIS — K862 Cyst of pancreas: Secondary | ICD-10-CM | POA: Insufficient documentation

## 2021-08-01 MED ORDER — DIPHENHYDRAMINE HCL 50 MG/ML IJ SOLN
25.0000 mg | Freq: Once | INTRAMUSCULAR | Status: DC | PRN
Start: 2021-08-01 — End: 2021-08-03

## 2021-08-01 MED ORDER — LACTATED RINGERS IV SOLN
INTRAVENOUS | Status: DC | PRN
Start: 2021-08-01 — End: 2021-08-01

## 2021-08-01 MED ORDER — GLUCAGON HCL RDNA (DIAGNOSTIC) 1 MG IJ SOLR
0.5000 mg | INTRAMUSCULAR | Status: DC | PRN
Start: 2021-08-01 — End: 2021-08-03

## 2021-08-01 MED ORDER — HEPARIN SOD (PORK) LOCK FLUSH 100 UNIT/ML IV SOLN
5.0000 mL | Freq: Once | INTRAVENOUS | Status: DC | PRN
Start: 2021-08-01 — End: 2021-08-03

## 2021-08-01 MED ORDER — ATROPINE SULFATE 1 MG/10ML IJ SOSY
0.5000 mg | PREFILLED_SYRINGE | INTRAMUSCULAR | Status: AC | PRN
Start: 2021-08-01 — End: 2021-08-01

## 2021-08-01 MED ORDER — PROPOFOL 10 MG/ML IV EMUL WRAPPER (OSM ONLY)
INTRAVENOUS | Status: DC | PRN
Start: 2021-08-01 — End: 2021-08-01
  Administered 2021-08-01: 90 ug/kg/min via INTRAVENOUS

## 2021-08-01 MED ORDER — PROPOFOL 10 MG/ML IV EMUL WRAPPER (OSM ONLY)
INTRAVENOUS | Status: DC | PRN
Start: 2021-08-01 — End: 2021-08-01

## 2021-08-01 MED ORDER — PROPOFOL 10 MG/ML IV EMUL WRAPPER (OSM ONLY)
INTRAVENOUS | Status: DC | PRN
Start: 2021-08-01 — End: 2021-08-01
  Administered 2021-08-01: 30 mg via INTRAVENOUS
  Administered 2021-08-01: 40 mg via INTRAVENOUS
  Administered 2021-08-01: 30 mg via INTRAVENOUS

## 2021-08-01 MED ORDER — GLUCAGON HCL RDNA (DIAGNOSTIC) 1 MG IJ SOLR
0.2500 mg | INTRAMUSCULAR | Status: DC | PRN
Start: 2021-08-01 — End: 2021-08-03

## 2021-08-01 MED ORDER — DEXTROSE 10% IV SOLUTION BOLUS
125.0000 mL | Status: DC | PRN
Start: 2021-08-01 — End: 2021-08-03

## 2021-08-01 MED ORDER — ONDANSETRON HCL 4 MG/2ML IJ SOLN
4.0000 mg | Freq: Three times a day (TID) | INTRAMUSCULAR | Status: AC | PRN
Start: 2021-08-01 — End: 2021-08-01

## 2021-08-01 MED ORDER — EPINEPHRINE 1 MG/10ML IJ SOSY
1.0000 mg | PREFILLED_SYRINGE | INTRAVENOUS | Status: DC | PRN
Start: 2021-08-01 — End: 2021-08-03

## 2021-08-01 MED ORDER — NALOXONE HCL 0.4 MG/ML IJ SOLN
0.0400 mg | INTRAMUSCULAR | Status: DC | PRN
Start: 2021-08-01 — End: 2021-08-03

## 2021-08-01 MED ORDER — LIDOCAINE HCL 2 % IJ SOLN
INTRAMUSCULAR | Status: DC | PRN
Start: 2021-08-01 — End: 2021-08-01
  Administered 2021-08-01: 80 mg via INTRAVENOUS

## 2021-08-01 MED ORDER — INSULIN LISPRO 100 UNIT/ML IJ SOLN
2.0000 [IU] | INTRAMUSCULAR | Status: DC | PRN
Start: 2021-08-01 — End: 2021-08-03

## 2021-08-01 MED ORDER — GLUCAGON HCL RDNA (DIAGNOSTIC) 1 MG IJ SOLR
1.0000 mg | INTRAMUSCULAR | Status: DC | PRN
Start: 2021-08-01 — End: 2021-08-03

## 2021-08-01 MED ORDER — FLUMAZENIL 0.5 MG/5ML IV SOLN
0.2000 mg | INTRAVENOUS | Status: DC | PRN
Start: 2021-08-01 — End: 2021-08-03

## 2021-08-01 MED ORDER — SODIUM CHLORIDE 0.9 % IV SOLN
125.0000 mL/h | INTRAVENOUS | Status: AC
Start: 2021-08-01 — End: 2021-08-01

## 2021-08-01 MED ORDER — METOCLOPRAMIDE HCL 5 MG/ML IJ SOLN
10.0000 mg | Freq: Once | INTRAMUSCULAR | Status: DC | PRN
Start: 2021-08-01 — End: 2021-08-03

## 2021-08-01 MED ORDER — FENTANYL CITRATE (PF) 100 MCG/2ML IJ SOLN
25.0000 ug | INTRAMUSCULAR | Status: AC | PRN
Start: 2021-08-01 — End: 2021-08-01

## 2021-08-01 MED ORDER — MIDAZOLAM HCL (PF) 2 MG/2ML IJ SOLN
1.0000 mg | INTRAMUSCULAR | Status: AC | PRN
Start: 2021-08-01 — End: 2021-08-01

## 2021-08-01 MED ORDER — LIDOCAINE HCL URETHRAL/MUCOSAL 2 % EX GEL
Freq: Once | CUTANEOUS | Status: DC | PRN
Start: 2021-08-01 — End: 2021-08-03

## 2021-08-01 NOTE — Telephone Encounter (Signed)
Nurse triage message received stating that Jasmine Hunt never received instructions for her EUS today. I called her at home, there was no answer. I also called her mobile number and reached her. She states that her questions have been answered. I reminded her that she should be NPO now.

## 2021-08-01 NOTE — Procedure Nursing Note (Signed)
MAC anesth - propofol only - EUS - tolerated well

## 2021-08-01 NOTE — Anesthesia Postprocedure Evaluation (Signed)
Patient: Jasmine Hunt    Procedure Summary     Date: 08/01/21 Room / Location: [facility]    Anesthesia Start: 2003 Anesthesia Stop: 7944    Procedure: ENDOSCOPIC ULTRASOUND (UPPER) Diagnosis:       BRCA2 gene mutation positive      Family history of pancreatic cancer      (For evaluation of pancreatic cystic neoplasm)    Scheduled Providers: Murvin Donning, MD; Mertha Baars, MD; Kayvan Hoefling, [place] C, CRNA Responsible Provider: Mertha Baars, MD    Anesthesia Type: general ASA Status: 2        Final Anesthesia Type: general    Vitals Value Taken Time   BP 119/82 08/01/21 1015   Temp  08/01/21 1019   Pulse 93 08/01/21 1016   SpO2 95 % 08/01/21 1016   Vitals shown include unvalidated device data.    Place of evaluation: PACU    Patient participation: patient participated    Level of consciousness: fully conscious    Patient pain control satisfaction: patient is satisfied with level of pain control    Airway patency: patent    Cardiovascular status during assessment: stable    Respiratory status during assessment: breathing comfortably    Anesthetic complications: no    Intravascular volume status assessment: euvolemic    Nausea / vomiting: patient is not experiencing nausea      Planned post-operative disposition at time of assessment: hospital discharge

## 2021-08-04 ENCOUNTER — Telehealth (HOSPITAL_BASED_OUTPATIENT_CLINIC_OR_DEPARTMENT_OTHER): Payer: Self-pay | Admitting: Gastroenterology

## 2021-08-04 NOTE — Telephone Encounter (Signed)
Per Dr Tye Savoy procedure note on 9/30,place recall for the following:      Perform magnetic resonance imaging (MRI) with                        gadolinium in 1 year.                       - Repeat the upper endoscopic ultrasound in 2 years for                        surveillance.

## 2021-08-09 ENCOUNTER — Encounter (INDEPENDENT_AMBULATORY_CARE_PROVIDER_SITE_OTHER): Payer: Self-pay

## 2021-08-10 ENCOUNTER — Encounter (INDEPENDENT_AMBULATORY_CARE_PROVIDER_SITE_OTHER): Payer: Self-pay

## 2021-08-11 ENCOUNTER — Other Ambulatory Visit (HOSPITAL_BASED_OUTPATIENT_CLINIC_OR_DEPARTMENT_OTHER): Payer: Self-pay

## 2021-08-12 ENCOUNTER — Encounter (INDEPENDENT_AMBULATORY_CARE_PROVIDER_SITE_OTHER): Payer: Self-pay | Admitting: Internal Medicine

## 2021-08-19 ENCOUNTER — Other Ambulatory Visit (HOSPITAL_BASED_OUTPATIENT_CLINIC_OR_DEPARTMENT_OTHER): Payer: Self-pay

## 2021-08-19 DIAGNOSIS — C50911 Malignant neoplasm of unspecified site of right female breast: Secondary | ICD-10-CM

## 2021-08-19 DIAGNOSIS — C50912 Malignant neoplasm of unspecified site of left female breast: Secondary | ICD-10-CM

## 2021-08-20 ENCOUNTER — Other Ambulatory Visit (HOSPITAL_BASED_OUTPATIENT_CLINIC_OR_DEPARTMENT_OTHER): Payer: Self-pay | Admitting: Hematology & Oncology

## 2021-08-20 DIAGNOSIS — Z1509 Genetic susceptibility to other malignant neoplasm: Secondary | ICD-10-CM

## 2021-08-20 DIAGNOSIS — C50911 Malignant neoplasm of unspecified site of right female breast: Secondary | ICD-10-CM

## 2021-08-20 DIAGNOSIS — Z1501 Genetic susceptibility to malignant neoplasm of breast: Secondary | ICD-10-CM

## 2021-08-21 ENCOUNTER — Telehealth (HOSPITAL_BASED_OUTPATIENT_CLINIC_OR_DEPARTMENT_OTHER): Payer: Self-pay

## 2021-08-21 NOTE — Telephone Encounter (Signed)
[facility] called because while they received the US guided breast bx referral, they also need an order for just the ultra sound right breast as well.

## 2021-08-27 ENCOUNTER — Other Ambulatory Visit (INDEPENDENT_AMBULATORY_CARE_PROVIDER_SITE_OTHER): Payer: Self-pay | Admitting: Internal Medicine

## 2021-08-27 DIAGNOSIS — A6000 Herpesviral infection of urogenital system, unspecified: Secondary | ICD-10-CM

## 2021-08-27 DIAGNOSIS — F419 Anxiety disorder, unspecified: Secondary | ICD-10-CM

## 2021-08-27 MED ORDER — DIAZEPAM 5 MG PO TABS
5.0000 mg | ORAL_TABLET | Freq: Three times a day (TID) | ORAL | 2 refills | Status: DC | PRN
Start: 2021-08-27 — End: 2023-02-17

## 2021-08-27 MED ORDER — VALACYCLOVIR HCL 500 MG PO TABS
ORAL_TABLET | ORAL | 1 refills | Status: DC
Start: 2021-08-27 — End: 2023-01-21

## 2021-08-29 ENCOUNTER — Other Ambulatory Visit (INDEPENDENT_AMBULATORY_CARE_PROVIDER_SITE_OTHER): Payer: Self-pay | Admitting: Internal Medicine

## 2021-08-29 DIAGNOSIS — M25562 Pain in left knee: Secondary | ICD-10-CM

## 2021-08-29 DIAGNOSIS — Z9889 Other specified postprocedural states: Secondary | ICD-10-CM

## 2021-08-29 DIAGNOSIS — G8929 Other chronic pain: Secondary | ICD-10-CM

## 2021-08-29 MED ORDER — OXYCODONE-ACETAMINOPHEN 5-325 MG PO TABS
ORAL_TABLET | ORAL | 0 refills | Status: DC
Start: 2021-08-29 — End: 2022-03-12

## 2021-09-03 ENCOUNTER — Telehealth (INDEPENDENT_AMBULATORY_CARE_PROVIDER_SITE_OTHER): Payer: Self-pay

## 2021-09-03 NOTE — Telephone Encounter (Signed)
Per Dr. Katrinka Blazing, give it a few more days since she has multiple sensitivities to BP meds.     Called pt and LVM relaying above and to report back to [place] on Friday.

## 2021-09-03 NOTE — Telephone Encounter (Signed)
I spoke with pt who reports morning BPs pre medication were elevated since Monday -    178/93  177/103  200/104 headache and dizziness   175/96   154/93  12:30 am 138/77    She said SBPs usually run between 140-150 mmHg.    Compliant in taking -  Atenolol 50mg  BID  Losartan 100mg  daily    Denies increased salt intake or dietary changes. Admits to feeling stressed lately and  knee pain - does not take any pain meds.      Vitals while on the phone BP 170/104 71, recheck after 5 minutes 173/99 70 with some lightheadedness.     No syncope or presyncope, vision changes, headache, chest pain.    Will send secure chat to Dr. Katrinka Blazing. Go to ED for new or worsening symptoms.

## 2021-09-03 NOTE — Telephone Encounter (Signed)
Message sent to Dr. Katrinka Blazing following up advise

## 2021-09-09 ENCOUNTER — Encounter (INDEPENDENT_AMBULATORY_CARE_PROVIDER_SITE_OTHER): Payer: Self-pay

## 2021-09-10 ENCOUNTER — Encounter (INDEPENDENT_AMBULATORY_CARE_PROVIDER_SITE_OTHER): Payer: Self-pay

## 2021-09-11 ENCOUNTER — Encounter (HOSPITAL_BASED_OUTPATIENT_CLINIC_OR_DEPARTMENT_OTHER): Payer: No Typology Code available for payment source

## 2021-09-11 ENCOUNTER — Other Ambulatory Visit (HOSPITAL_BASED_OUTPATIENT_CLINIC_OR_DEPARTMENT_OTHER): Payer: Self-pay

## 2021-09-15 ENCOUNTER — Telehealth (HOSPITAL_BASED_OUTPATIENT_CLINIC_OR_DEPARTMENT_OTHER): Payer: Self-pay

## 2021-09-15 ENCOUNTER — Other Ambulatory Visit (HOSPITAL_BASED_OUTPATIENT_CLINIC_OR_DEPARTMENT_OTHER): Payer: Self-pay

## 2021-09-15 NOTE — Telephone Encounter (Signed)
Patient called requesting for Biopsy results. Stated on Mychart it looks like there is a line through and unable to read.

## 2021-09-16 ENCOUNTER — Telehealth (HOSPITAL_BASED_OUTPATIENT_CLINIC_OR_DEPARTMENT_OTHER): Payer: Self-pay

## 2021-09-16 NOTE — Telephone Encounter (Signed)
See telephone encounter.

## 2021-09-16 NOTE — Telephone Encounter (Signed)
Spoke to [place] about biopsy results. Per [facility], right axillary is benign, demonstrating reactive follicular hyperplasia. She was appreciative of the call.

## 2021-09-17 ENCOUNTER — Telehealth (HOSPITAL_BASED_OUTPATIENT_CLINIC_OR_DEPARTMENT_OTHER): Payer: Self-pay

## 2021-09-17 NOTE — Telephone Encounter (Signed)
Followed up with Jasmine Hunt yesterday about biopsy pathology. Per Dr. Talmage Coin, patient does not have any change, lymph node is benign, findings on scans not concerning, and she does not need a short term follow up breast MRI as [facility] report recommended, but she needs to continue alternating with MRI and mamograms as scheduled. She was appreciative of the call.

## 2021-09-18 ENCOUNTER — Other Ambulatory Visit (INDEPENDENT_AMBULATORY_CARE_PROVIDER_SITE_OTHER): Payer: Self-pay | Admitting: Internal Medicine

## 2021-09-18 DIAGNOSIS — I10 Essential (primary) hypertension: Secondary | ICD-10-CM

## 2021-10-02 ENCOUNTER — Encounter (INDEPENDENT_AMBULATORY_CARE_PROVIDER_SITE_OTHER): Payer: Self-pay | Admitting: Registered Nurse

## 2021-10-02 ENCOUNTER — Ambulatory Visit (INDEPENDENT_AMBULATORY_CARE_PROVIDER_SITE_OTHER): Payer: Medicare Other | Admitting: Registered Nurse

## 2021-10-02 ENCOUNTER — Other Ambulatory Visit (INDEPENDENT_AMBULATORY_CARE_PROVIDER_SITE_OTHER): Payer: Self-pay | Admitting: Registered Nurse

## 2021-10-02 VITALS — BP 162/85 | HR 60 | Temp 97.1°F | Ht 61.0 in | Wt 138.0 lb

## 2021-10-02 DIAGNOSIS — G4733 Obstructive sleep apnea (adult) (pediatric): Secondary | ICD-10-CM

## 2021-10-02 DIAGNOSIS — I10 Essential (primary) hypertension: Secondary | ICD-10-CM

## 2021-10-02 DIAGNOSIS — R002 Palpitations: Secondary | ICD-10-CM

## 2021-10-02 DIAGNOSIS — R600 Localized edema: Secondary | ICD-10-CM

## 2021-10-02 MED ORDER — NIFEDIPINE ER OSMOTIC RELEASE 30 MG PO TB24
30.0000 mg | ORAL_TABLET | Freq: Every day | ORAL | 0 refills | Status: DC
Start: 2021-10-02 — End: 2021-12-23

## 2021-10-02 NOTE — Progress Notes (Signed)
[facility] OFFICE VISIT      ASSESSMENT & PLAN:   1. Essential hypertension  ECG 12 lead    NIFEdipine XL (PROCARDIA XL) 30 MG 24 hr tablet      2. Localized edema  [place] Lower Extremity Venous Doppler for DVT      3. Obstructive sleep apnea syndrome        4. Palpitations             *Lower right leg edema and pain after car trip, ultrasound of LE to r/o DVT    *Hypertension: Multiple medication intolerances since bariatric surgery (details in HPI below).  Currently she is on losartan 100 mg daily and atenolol 50 mg bid.  She also complains of nausea when taking her losartan but is willing to take it for better BP control. Home BP readings: range from 140/80-180/91. She is willing to try nifedipine 30 mg (24-hour) and have recommended at night prior to bed with a small amount of food. Counseled and provided printed material on lifestyle changes.     *Palpitations: She has had intermittent palpitations.  Palpitations have decreased with the addition of beta-blocker to her regimen.  No significant events on a recent 24-hour Holter monitor.  Last year she did wear a 30-day event monitor that noted occasional PVCs. Continue to monitor.     *Cerebrovascular disease: History of a TIA.  She has had no recurrence.  This TIA occurred when she was not on aspirin.  Currently compliant with aspirin.    Keep Nov 06, 2021 appointment with Dr. Katrinka Blazing.  Patient Instructions   Try nifedipine 30 mg at night before you go to bed. Take some small food when you take the nifedipine.     Your target blood pressure is 110-130/50-80.  Please check your blood pressure twice daily: 1) in the morning and 2) at random times in the day.  Do this for 2 weeks. Bring your results to your follow up appointment.      Have ultrasound of leg today or tomorrow.       HISTORY OF PRESENT ILLNESS:  I had the pleasure of seeing Amber [name] today in a cardiac follow up office visit for hypertension and palpitations.     Amber [name] is a 70 y.o.  female with a past medical history significant for hypertension and palpitations.  She reports the following medication intolerances: amlodipine: nausea and dizziness  HCTZ: headaches and palpitations, Hydralazine: headaches, diltiazem: nausea, carvedilol: extreme fatigue but is tolerating atenolol, lisinopril: nausea but is tolerating ARB.     At today's visit Amber [name] is doing well.  She denies chest pain, shortness of breath,  presyncope, or syncope.   She reports palpitations are unchanged from previous visits.  She reports that she was having palpitations during the EKG today but no arrhythmia.     Cardiographics:  Echocardiogram (04/04/2020): Normal LV/RV size and systolic function.  Ejection fraction 60%.  Mild left ventricular hypertrophy.  No significant valvular heart disease.  No pericardial effusion.     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    Sinusitis 11/28/2018    Primary osteoarthritis of both knees 11/28/2018    Small bowel obstruction due to adhesions 09/01/2018    Other dysphagia 08/26/2018    Encounter for vitamin deficiency screening 08/10/2018    Nausea 08/10/2018    Epigastric abdominal tenderness without rebound tenderness 09/08/2016    Anxiety 06/25/2016    Nutritional deficiency 04/17/2016    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    Hiatal hernia 09/20/2015    Osteoarthritis of knees, bilateral 09/20/2015    Headache 07/12/2015    Dyslipidemia 03/21/2015    Palpitations 02/12/2015    Transient cerebral ischemia,  unspecified type 11/07/2014    S/P laparoscopic cholecystectomy 08/27/2014    Other long term (current) drug therapy 08/12/2014    Slow transit constipation 07/27/2014    Obstructive sleep apnea syndrome 05/01/2014    Anxiety state, unspecified 03/01/2014    Vitamin D deficiency 02/19/2014    Overactive bladder 02/06/2014    Abnormal electrocardiogram 01/08/2014    Essential hypertension 11/21/2013    History of spinal surgery 11/21/2013    Hypernatremia 11/11/2013    Paresthesia 11/11/2013    Cerebral infarction 04/15/2013    Gastroesophageal reflux disease 04/02/2013    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 30 tablet 1    benzonatate (TESSALON) 100 MG capsule Take 1 capsule (100 mg total) by mouth 3 (three) times daily as needed for Cough 30 capsule 1    Cyanocobalamin (B-12 IJ) Inject as directed every 30 (thirty) days         diazePAM (VALIUM) 5 MG tablet Take 1 tablet (5 mg total) by mouth every 8 (eight) hours as needed for Anxiety 30 tablet 2    famotidine (PEPCID) 20 MG tablet Take 1 tablet (20 mg total) by mouth daily 90 tablet 1    fluticasone (FLONASE) 50 MCG/ACT nasal spray 2 sprays by Nasal route daily. (Patient taking differently: 2 sprays by Nasal route as needed) 16 g 2    lidocaine (XYLOCAINE) 2 % jelly Apply topically as needed (sore) 30 mL 1    Multiple Vitamin (MULTIVITAMIN) capsule Take 1 capsule by mouth daily      naloxone (NARCAN) 4 MG/0.1ML nasal spray 1 spray intranasally. If pt does not respond or relapses into respiratory depression call 911. Give additional doses every 2-3 min. (Patient not taking: Reported on 07/31/2021) 2  each 0    NIFEdipine XL (PROCARDIA XL) 30 MG 24 hr tablet Take 1 tablet (30 mg) by mouth daily 30 tablet 0    ondansetron (ZOFRAN-ODT) 4 MG disintegrating tablet Take 1 tablet (4 mg total) by mouth every 8 (eight) hours as needed for Nausea 30 tablet 2    oxyCODONE-acetaminophen (PERCOCET) 5-325 MG per tablet Take one to two tablets po bid as needed for pain 60 tablet 0    pantoprazole (PROTONIX) 40 MG tablet Take 1 tablet (40 mg total) by mouth  daily 90 tablet 1    traMADol (ULTRAM) 50 MG tablet 50 mg      valACYclovir HCL (VALTREX) 500 MG tablet TAKE 1 TABLET(500 MG) BY MOUTH DAILY 90 tablet 1    vitamin D, ergocalciferol, (DRISDOL) 50000 UNIT Cap Take 1 capsule (50,000 Units total) by mouth once a week 12 capsule 1     No current facility-administered medications for this visit.        SH:   Social History     Tobacco Use    Smoking status: Never    Smokeless tobacco: Never   Vaping Use    Vaping Use: Never used   Substance Use Topics    Alcohol use: Not Currently    Drug use: Never       REVIEW OF SYSTEMS: All other systems reviewed and negative except as above.    PHYSICAL EXAMINATION   General Appearance: A well-appearing female in no acute distress.   Vital Signs: BP 162/85 (BP Site: Left arm, Patient Position: Sitting, Cuff Size: Medium)    Pulse 60    Temp 97.1 F (36.2 C) (Temporal)    Ht 1.549 m (5\' 1" )    Wt 62.6 kg (138 lb)    LMP  (LMP Unknown)    SpO2 100%    BMI 26.07 kg/m    HEENT: Sclera anicteric, conjunctiva without pallor, moist mucous membranes, normal dentition.   Neck: Supple without jugular venous distention. Normal carotid upstrokes without bruits.  Chest: Clear to auscultation bilaterally with good air movement and respiratory effort and no wheezes, rales, or rhonchi  Cardiovascular: Normal S1 and physiologically split S2 without murmurs, gallops or rub. PMI of normal size and nondisplaced.   Abdomen: Soft, nontender. No organomegaly.  No pulsatile masses or bruits.    Extremities: Warm  without edema. All peripheral pulses are full and equal.  Skin: No rash, xanthoma or xanthelasma.   Neuro: Alert and oriented x3. Grossly intact. Strength is symmetrical. Normal mood and affect.       Basic Metabolic Profile   Lab Results   Component Value Date    NA 139 07/17/2021    K 4.1 07/17/2021    BUN 7.0 07/17/2021    CREAT 0.6 07/17/2021    MG 2.0 06/16/2018    CA 8.6 07/17/2021    GLU 113 (H) 07/17/2021         Cardiac Biomarkers   No results found for: TROPONIN, BNP, BNP     CBC with Diff   Lab Results   Component Value Date    WBC 4.30 07/17/2021    HGB 11.9 07/17/2021    HCT 35.4 07/17/2021    PLT 194 07/17/2021         Cholesterol Panel   Lab Results   Component Value Date    CHOL 196 10/04/2019    HDL 73 10/04/2019    LDL 115 (H) 10/04/2019    TRIG 40 10/04/2019         Endocrine   Lab Results   Component Value Date    HGBA1C 4.8 11/25/2020    HGBA1C 5.0 01/25/2018    HGBA1C 5.0 04/30/2017    TSH 1.09 10/04/2019         Coagulation Studies   Lab Results   Component Value Date    INR 1.0 11/25/2020    DDIMER 0.28 01/24/2018         ----------------------------  Amber [name], Amber [name]  [facility] [address]  [place]   Tel.: [phone], Fax: [phone]  [address], [place], [place] [address]  [address], [place], [place] [address]  _____________________________      Incident to service performed with physician present in the office. The physician's plan of care was implemented.

## 2021-10-02 NOTE — Patient Instructions (Addendum)
Try nifedipine 30 mg at night before you go to bed. Take some small food when you take the nifedipine.     Your target blood pressure is 110-130/50-80.  Please check your blood pressure twice daily: 1) in the morning and 2) at random times in the day.  Do this for 2 weeks. Bring your results to your follow up appointment.      Have ultrasound of leg today or tomorrow.

## 2021-10-09 ENCOUNTER — Encounter (INDEPENDENT_AMBULATORY_CARE_PROVIDER_SITE_OTHER): Payer: Self-pay

## 2021-10-09 LAB — ECG 12-LEAD
Atrial Rate: 58 {beats}/min
P Axis: 67 degrees
P-R Interval: 176 ms
Q-T Interval: 416 ms
QRS Duration: 78 ms
QTC Calculation (Bezet): 408 ms
R Axis: -2 degrees
T Axis: 25 degrees
Ventricular Rate: 58 {beats}/min

## 2021-10-10 ENCOUNTER — Encounter (INDEPENDENT_AMBULATORY_CARE_PROVIDER_SITE_OTHER): Payer: Self-pay | Admitting: Internal Medicine

## 2021-10-10 ENCOUNTER — Encounter (INDEPENDENT_AMBULATORY_CARE_PROVIDER_SITE_OTHER): Payer: Self-pay

## 2021-10-15 ENCOUNTER — Ambulatory Visit (INDEPENDENT_AMBULATORY_CARE_PROVIDER_SITE_OTHER): Payer: Medicare Other

## 2021-10-15 DIAGNOSIS — R600 Localized edema: Secondary | ICD-10-CM

## 2021-10-24 ENCOUNTER — Encounter (INDEPENDENT_AMBULATORY_CARE_PROVIDER_SITE_OTHER): Payer: Self-pay | Admitting: Internal Medicine

## 2021-10-29 ENCOUNTER — Ambulatory Visit (INDEPENDENT_AMBULATORY_CARE_PROVIDER_SITE_OTHER): Payer: Medicare Other | Admitting: Cardiology

## 2021-10-30 ENCOUNTER — Other Ambulatory Visit (INDEPENDENT_AMBULATORY_CARE_PROVIDER_SITE_OTHER): Payer: Self-pay | Admitting: Internal Medicine

## 2021-11-06 ENCOUNTER — Ambulatory Visit (INDEPENDENT_AMBULATORY_CARE_PROVIDER_SITE_OTHER): Payer: Medicare Other | Admitting: Cardiology

## 2021-11-09 ENCOUNTER — Encounter (INDEPENDENT_AMBULATORY_CARE_PROVIDER_SITE_OTHER): Payer: Self-pay

## 2021-11-10 ENCOUNTER — Encounter (INDEPENDENT_AMBULATORY_CARE_PROVIDER_SITE_OTHER): Payer: Self-pay

## 2021-11-22 ENCOUNTER — Emergency Department: Payer: Self-pay

## 2021-12-04 ENCOUNTER — Encounter (HOSPITAL_BASED_OUTPATIENT_CLINIC_OR_DEPARTMENT_OTHER): Payer: Self-pay

## 2021-12-10 ENCOUNTER — Encounter (INDEPENDENT_AMBULATORY_CARE_PROVIDER_SITE_OTHER): Payer: Self-pay

## 2021-12-11 ENCOUNTER — Encounter (INDEPENDENT_AMBULATORY_CARE_PROVIDER_SITE_OTHER): Payer: Self-pay

## 2021-12-22 ENCOUNTER — Encounter (INDEPENDENT_AMBULATORY_CARE_PROVIDER_SITE_OTHER): Payer: Self-pay | Admitting: Internal Medicine

## 2021-12-23 ENCOUNTER — Ambulatory Visit (INDEPENDENT_AMBULATORY_CARE_PROVIDER_SITE_OTHER): Payer: Medicare Other | Admitting: Cardiology

## 2021-12-23 ENCOUNTER — Encounter (INDEPENDENT_AMBULATORY_CARE_PROVIDER_SITE_OTHER): Payer: Self-pay | Admitting: Cardiology

## 2021-12-23 VITALS — BP 151/88 | HR 64 | Resp 15 | Ht 61.0 in | Wt 141.2 lb

## 2021-12-23 DIAGNOSIS — E7800 Pure hypercholesterolemia, unspecified: Secondary | ICD-10-CM

## 2021-12-23 DIAGNOSIS — I10 Essential (primary) hypertension: Secondary | ICD-10-CM

## 2021-12-23 LAB — ECG 12-LEAD
Atrial Rate: 62 {beats}/min
IHS MUSE NARRATIVE AND IMPRESSION: NORMAL
P Axis: 47 degrees
P-R Interval: 174 ms
Q-T Interval: 390 ms
QRS Duration: 92 ms
QTC Calculation (Bezet): 395 ms
R Axis: -4 degrees
T Axis: 22 degrees
Ventricular Rate: 62 {beats}/min

## 2021-12-23 MED ORDER — CLONIDINE 0.1 MG/24HR TD PTWK
1.0000 | MEDICATED_PATCH | TRANSDERMAL | 0 refills | Status: DC
Start: 2021-12-23 — End: 2022-03-12

## 2021-12-23 NOTE — Progress Notes (Signed)
Amber [name] 71 y.o. with a history of HTN presents for cardiac follow up of hypertension.     Since her gastric surgery, she has been intolerant to different medications due to GI distress.       Hypertension: Multiple medication intolerances since bariatric surgery.  Currently she is on losartan 100 mg daily and atenolol 50 mg bid.  She was intolerant to the recently prescribed hydrochlorothiazide, nifedipine ( coughing).   Blood pressure control has still mildly uncontrolled. She also complains of significant nausea when taking her losartan.     Cerebrovascular disease: History of a TIA.  She has had no recurrence.  This TIA occurred when she was not on aspirin.  Currently compliant with aspirin.      Cardiac work up has included     Echocardiogram (04/04/2020): Normal LV/RV size and systolic function.  Ejection fraction 60%.  Mild left ventricular hypertrophy.  No significant valvular heart disease.  No pericardial effusion.                    - stress test (11/06/17 - normal myocardial perfusion)               - cardiac cath ( 01/08/14 - normal )              - echocardiogram ( EF 55%, mild TR)        Current Outpatient Medications   Medication Sig Dispense Refill    aspirin EC 81 MG EC tablet Take 1 tablet (81 mg total) by mouth daily. 30 tablet 1    atenolol (TENORMIN) 50 MG tablet TAKE 1 TABLET(50 MG) BY MOUTH TWICE DAILY 180 tablet 0    benzonatate (TESSALON) 100 MG capsule Take 1 capsule (100 mg total) by mouth 3 (three) times daily as needed for Cough 30 capsule 1    Cyanocobalamin (B-12 IJ) Inject as directed every 30 (thirty) days         diazePAM (VALIUM) 5 MG tablet Take 1 tablet (5 mg total) by mouth every 8 (eight) hours as needed for Anxiety 30 tablet 2    famotidine (PEPCID) 20 MG tablet Take 1 tablet (20 mg total) by mouth daily 90 tablet 1    fluticasone (FLONASE) 50 MCG/ACT nasal spray 2 sprays by Nasal route daily. (Patient taking differently: 2 sprays by Nasal route as needed) 16 g 2     losartan (COZAAR) 100 MG tablet TAKE 1 TABLET(100 MG) BY MOUTH DAILY 90 tablet 0    Multiple Vitamin (MULTIVITAMIN) capsule Take 1 capsule by mouth daily      naloxone (NARCAN) 4 MG/0.1ML nasal spray 1 spray intranasally. If pt does not respond or relapses into respiratory depression call 911. Give additional doses every 2-3 min. 2 each 0    ondansetron (ZOFRAN-ODT) 4 MG disintegrating tablet Take 1 tablet (4 mg total) by mouth every 8 (eight) hours as needed for Nausea 30 tablet 2    oxyCODONE-acetaminophen (PERCOCET) 5-325 MG per tablet Take one to two tablets po bid as needed for pain 60 tablet 0    pantoprazole (PROTONIX) 40 MG tablet Take 1 tablet (40 mg total) by mouth daily 90 tablet 1    traMADol (ULTRAM) 50 MG tablet 50 mg      valACYclovir HCL (VALTREX) 500 MG tablet TAKE 1 TABLET(500 MG) BY MOUTH DAILY 90 tablet 1    vitamin D, ergocalciferol, (DRISDOL) 50000 UNIT Cap Take 1 capsule (50,000 Units total) by mouth once a  week 12 capsule 1    cloNIDine (CATAPRES-TTS-1) 0.1 MG/24HR Place 1 patch onto the skin once a week 12 patch 0     No current facility-administered medications for this visit.            PE:    Vitals:    12/23/21 0814   BP: 151/88   Pulse: 64   Resp: 15   SpO2: 98%     Body mass index is 26.68 kg/m.    Physical Examination: General appearance - alert, well appearing, and in no distress  Mental status - alert, oriented to person, place, and time  Chest - clear to auscultation, no wheezes, rales or rhonchi, symmetric air entry  Heart - normal rate and regular rhythm, S1 and S2 normal  Abdomen - soft, nontender, nondistended, no masses or organomegaly  Extremities - no pedal edema noted        Labs:  Lipid Panel   Cholesterol   Date/Time Value Ref Range Status   10/04/2019 09:56 AM 196 0 - 199 mg/dL Final     Triglycerides   Date/Time Value Ref Range Status   10/04/2019 09:56 AM 40 34 - 149 mg/dL Final     HDL   Date/Time Value Ref Range Status   10/04/2019 09:56 AM 73 40 - 9,999 mg/dL Final      Comment:     An HDL cholesterol <40 mg/dL is low and constitutes a  coronary heart disease risk factor, and HDL-C>59 mg/dL is  a negative risk factor for CHD.  Ref: American Heart Association; Circulation 2004         CMP:   Sodium   Date/Time Value Ref Range Status   07/17/2021 04:43 AM 139 135 - 145 mEq/L Final     Potassium   Date/Time Value Ref Range Status   07/17/2021 04:43 AM 4.1 3.5 - 5.3 mEq/L Final     Chloride   Date/Time Value Ref Range Status   07/17/2021 04:43 AM 106 99 - 111 mEq/L Final   08/16/2018 07:34 AM 107 98 - 110 mmol/L Final     CO2   Date/Time Value Ref Range Status   07/17/2021 04:43 AM 23 17 - 29 mEq/L Final     Glucose   Date/Time Value Ref Range Status   07/17/2021 04:43 AM 113 (H) 70 - 100 mg/dL Final     Comment:     ADA guidelines for diabetes mellitus:  Fasting:  Equal to or greater than 126 mg/dL  Random:   Equal to or greater than 200 mg/dL       BUN   Date/Time Value Ref Range Status   07/17/2021 04:43 AM 7.0 7.0 - 21.0 mg/dL Final     Protein, Total   Date/Time Value Ref Range Status   07/16/2021 12:58 AM 7.2 6.0 - 8.3 g/dL Final   16/10/960410/15/2019 [id number] AM 6.5 6.1 - 8.1 g/dL Final     Alkaline Phosphatase   Date/Time Value Ref Range Status   07/16/2021 12:58 AM 108 37 - 117 U/L Final     AST (SGOT)   Date/Time Value Ref Range Status   07/16/2021 12:58 AM 16 5 - 41 U/L Final     ALT   Date/Time Value Ref Range Status   07/16/2021 12:58 AM 10 0 - 55 U/L Final     Anion Gap   Date/Time Value Ref Range Status   07/17/2021 04:43 AM 10.0 5.0 - 15.0 Final     Comment:  Calculated AGAP = Na - (CL + CO2)  Interpret with caution; calculated AGAP may not reflect patient's  true clinical status.         CBC:   WBC   Date/Time Value Ref Range Status   07/17/2021 04:43 AM 4.30 3.10 - 9.50 x10 3/uL Final   06/30/2009 04:03 PM 9.12 3.50 - 10.80 /CUMM Final     RBC   Date/Time Value Ref Range Status   07/17/2021 04:43 AM 4.10 3.90 - 5.10 x10 6/uL Final     Hemoglobin   Date/Time Value Ref Range  Status   08/16/2018 07:34 AM 12.3 11.7 - 15.5 g/dL Final     Hgb   Date/Time Value Ref Range Status   07/17/2021 04:43 AM 11.9 11.4 - 14.8 g/dL Final     Hematocrit   Date/Time Value Ref Range Status   07/17/2021 04:43 AM 35.4 34.7 - 43.7 % Final     MCV   Date/Time Value Ref Range Status   07/17/2021 04:43 AM 86.3 78.0 - 96.0 fL Final     MCHC   Date/Time Value Ref Range Status   07/17/2021 04:43 AM 33.6 31.5 - 35.8 g/dL Final     RDW   Date/Time Value Ref Range Status   07/17/2021 04:43 AM 15 11 - 15 % Final     Platelets   Date/Time Value Ref Range Status   07/17/2021 04:43 AM 194 142 - 346 x10 3/uL Final   08/16/2018 07:34 AM 252 140 - 400 Thousand/uL Final           Impression / plan    Hypertension: Intolerant to many medications since gastric bypass surgery.  Continue atenolol 50 mg twice a day and losartan 100 mg daily.  We will add clonidine patch 0.1 mg.    Follow-up with cardiology in 3 months      Nelda Bucks, MD  12/23/2021

## 2022-01-02 ENCOUNTER — Encounter (INDEPENDENT_AMBULATORY_CARE_PROVIDER_SITE_OTHER): Payer: Self-pay | Admitting: Internal Medicine

## 2022-01-06 ENCOUNTER — Other Ambulatory Visit (INDEPENDENT_AMBULATORY_CARE_PROVIDER_SITE_OTHER): Payer: Self-pay | Admitting: Internal Medicine

## 2022-01-07 ENCOUNTER — Encounter (INDEPENDENT_AMBULATORY_CARE_PROVIDER_SITE_OTHER): Payer: Self-pay

## 2022-01-07 NOTE — Telephone Encounter (Signed)
Patient scheduled for 03/12/22    Thank you

## 2022-01-08 ENCOUNTER — Other Ambulatory Visit (INDEPENDENT_AMBULATORY_CARE_PROVIDER_SITE_OTHER): Payer: Self-pay | Admitting: Internal Medicine

## 2022-01-14 ENCOUNTER — Other Ambulatory Visit (INDEPENDENT_AMBULATORY_CARE_PROVIDER_SITE_OTHER): Payer: Self-pay | Admitting: Internal Medicine

## 2022-01-14 DIAGNOSIS — K219 Gastro-esophageal reflux disease without esophagitis: Secondary | ICD-10-CM

## 2022-01-14 NOTE — Telephone Encounter (Signed)
Last filled January 22, 2021.   Last o/v June 18, 2021.   Patient has an upcoming appointment on Mar 12 2022.  Queued up 90 with 0 refills.

## 2022-01-15 ENCOUNTER — Encounter (INDEPENDENT_AMBULATORY_CARE_PROVIDER_SITE_OTHER): Payer: Self-pay

## 2022-01-20 ENCOUNTER — Encounter (INDEPENDENT_AMBULATORY_CARE_PROVIDER_SITE_OTHER): Payer: Self-pay | Admitting: Internal Medicine

## 2022-01-23 ENCOUNTER — Telehealth (HOSPITAL_BASED_OUTPATIENT_CLINIC_OR_DEPARTMENT_OTHER): Payer: Self-pay

## 2022-01-23 ENCOUNTER — Other Ambulatory Visit: Payer: Medicare Other

## 2022-01-23 ENCOUNTER — Ambulatory Visit
Admission: RE | Admit: 2022-01-23 | Discharge: 2022-01-23 | Disposition: A | Discharge status: Home / Self Care | Payer: Medicare Other | Source: Ambulatory Visit | Attending: Surgery | Admitting: Surgery

## 2022-01-23 DIAGNOSIS — Z9884 Bariatric surgery status: Secondary | ICD-10-CM | POA: Insufficient documentation

## 2022-01-23 DIAGNOSIS — Z9889 Other specified postprocedural states: Secondary | ICD-10-CM | POA: Insufficient documentation

## 2022-01-23 DIAGNOSIS — Z5189 Encounter for other specified aftercare: Secondary | ICD-10-CM | POA: Insufficient documentation

## 2022-01-23 LAB — CBC AND DIFFERENTIAL
Absolute NRBC: 0 10*3/uL (ref 0.00–0.00)
Basophils Absolute Automated: 0.03 10*3/uL (ref 0.00–0.08)
Basophils Automated: 0.7 %
Eosinophils Absolute Automated: 0.07 10*3/uL (ref 0.00–0.44)
Eosinophils Automated: 1.7 %
Hematocrit: 46.4 % — ABNORMAL HIGH (ref 34.7–43.7)
Hgb: 14.6 g/dL (ref 11.4–14.8)
Immature Granulocytes Absolute: 0.01 10*3/uL (ref 0.00–0.07)
Immature Granulocytes: 0.2 %
Instrument Absolute Neutrophil Count: 2.58 10*3/uL (ref 1.10–6.33)
Lymphocytes Absolute Automated: 1.03 10*3/uL (ref 0.42–3.22)
Lymphocytes Automated: 25.6 %
MCH: 28.6 pg (ref 25.1–33.5)
MCHC: 31.5 g/dL (ref 31.5–35.8)
MCV: 91 fL (ref 78.0–96.0)
MPV: 10.5 fL (ref 8.9–12.5)
Monocytes Absolute Automated: 0.31 10*3/uL (ref 0.21–0.85)
Monocytes: 7.7 %
Neutrophils Absolute: 2.58 10*3/uL (ref 1.10–6.33)
Neutrophils: 64.1 %
Nucleated RBC: 0 /100 WBC (ref 0.0–0.0)
Platelets: 307 10*3/uL (ref 142–346)
RBC: 5.1 10*6/uL (ref 3.90–5.10)
RDW: 14 % (ref 11–15)
WBC: 4.03 10*3/uL (ref 3.10–9.50)

## 2022-01-23 LAB — HEMOLYSIS INDEX: Hemolysis Index: 6 Index (ref 0–24)

## 2022-01-23 LAB — COMPREHENSIVE METABOLIC PANEL
ALT: 14 U/L (ref 0–55)
AST (SGOT): 18 U/L (ref 5–41)
Albumin/Globulin Ratio: 1.3 (ref 0.9–2.2)
Albumin: 3.9 g/dL (ref 3.5–5.0)
Alkaline Phosphatase: 109 U/L (ref 37–117)
Anion Gap: 4 — ABNORMAL LOW (ref 5.0–15.0)
BUN: 11 mg/dL (ref 7.0–21.0)
Bilirubin, Total: 0.3 mg/dL (ref 0.2–1.2)
CO2: 31 mEq/L — ABNORMAL HIGH (ref 17–29)
Calcium: 9.2 mg/dL (ref 8.5–10.5)
Chloride: 107 mEq/L (ref 99–111)
Creatinine: 0.7 mg/dL (ref 0.4–1.0)
Globulin: 2.9 g/dL (ref 2.0–3.6)
Glucose: 87 mg/dL (ref 70–100)
Potassium: 5.3 mEq/L (ref 3.5–5.3)
Protein, Total: 6.8 g/dL (ref 6.0–8.3)
Sodium: 142 mEq/L (ref 135–145)

## 2022-01-23 LAB — IRON PROFILE
Iron Saturation: 23 % (ref 15–50)
Iron: 72 ug/dL (ref 40–145)
TIBC: 313 ug/dL (ref 265–497)
UIBC: 241 ug/dL (ref 126–382)

## 2022-01-23 LAB — VITAMIN D,25 OH,TOTAL: Vitamin D, 25 OH, Total: 7 ng/mL — ABNORMAL LOW (ref 30–100)

## 2022-01-23 LAB — LIPID PANEL
Cholesterol / HDL Ratio: 3 Index
Cholesterol: 185 mg/dL (ref 0–199)
HDL: 62 mg/dL (ref 40–9999)
LDL Calculated: 115 mg/dL — ABNORMAL HIGH (ref 0–99)
Triglycerides: 39 mg/dL (ref 34–149)
VLDL Calculated: 8 mg/dL — ABNORMAL LOW (ref 10–40)

## 2022-01-23 LAB — FOLATE: Folate: 8.7 ng/mL

## 2022-01-23 LAB — PTH, INTACT: PTH Intact: 139 pg/mL — ABNORMAL HIGH (ref 17.7–84.5)

## 2022-01-23 LAB — VITAMIN B12: Vitamin B-12: 879 pg/mL (ref 211–911)

## 2022-01-23 LAB — GFR: EGFR: >60

## 2022-01-23 NOTE — Telephone Encounter (Signed)
Patient calling about her 6 month follow up with labs and Mammo.  Can someone follow up with patient and let the schedulers know what to schedule (follow up with Jasmine Hunt?).  Thank you.

## 2022-01-25 ENCOUNTER — Emergency Department: Payer: Self-pay

## 2022-01-25 LAB — COPPER, SERUM: Copper: 114 ug/dL (ref 70–175)

## 2022-01-26 LAB — ZINC: Zinc: 59 ug/dL — ABNORMAL LOW (ref 60–130)

## 2022-01-28 LAB — WHOLE BLOOD VITAMIN B1 (THIAMINE): Vitamin B1, Whole Blood: 107 nmol/L (ref 78–185)

## 2022-01-28 LAB — VITAMIN A: Vitamin A: 39 ug/dL (ref 38–98)

## 2022-01-29 ENCOUNTER — Other Ambulatory Visit (HOSPITAL_BASED_OUTPATIENT_CLINIC_OR_DEPARTMENT_OTHER): Payer: Self-pay | Admitting: Medical Oncology

## 2022-01-29 ENCOUNTER — Ambulatory Visit (INDEPENDENT_AMBULATORY_CARE_PROVIDER_SITE_OTHER): Payer: Medicare Other | Admitting: Surgery

## 2022-01-29 ENCOUNTER — Encounter (INDEPENDENT_AMBULATORY_CARE_PROVIDER_SITE_OTHER): Payer: Self-pay | Admitting: Surgery

## 2022-01-29 VITALS — BP 144/85 | HR 65 | Ht 61.0 in | Wt 139.0 lb

## 2022-01-29 DIAGNOSIS — Z1501 Genetic susceptibility to malignant neoplasm of breast: Secondary | ICD-10-CM

## 2022-01-29 DIAGNOSIS — C50911 Malignant neoplasm of unspecified site of right female breast: Secondary | ICD-10-CM

## 2022-01-29 DIAGNOSIS — E559 Vitamin D deficiency, unspecified: Secondary | ICD-10-CM

## 2022-01-29 DIAGNOSIS — Z9884 Bariatric surgery status: Secondary | ICD-10-CM

## 2022-01-29 MED ORDER — VITAMIN D (ERGOCALCIFEROL) 1.25 MG (50000 UT) PO CAPS
50000.0000 [IU] | ORAL_CAPSULE | ORAL | 1 refills | Status: DC
Start: 2022-01-29 — End: 2023-04-12

## 2022-01-29 NOTE — Progress Notes (Signed)
ASSESSMENT:  Status post Roux-en-Y gastric bypass  Vitamin D deficiency  Elevated PTH level  Low zinc      PLAN:  Patient will be prescribed vitamin D 50,000 international units p.o. q. weekly  Patient is to take her vitamins and a zinc supplement.  Patient is to increase protein intake.  Patient is to follow-up with me in 6 months with labs      SUBJECTIVE:  The patient is a 71 y.o.  female who presents status post Roux-en-Y gastric bypass.  She had recent labs done.  She has not been taking her vitamins.  Patient has been getting physical therapy for her left knee which she has had chronic problems since her knee replacement.  Patient does get occasionally dumping syndrome.  She has problems with constipation.        The following portions of the patient's history were reviewed and updated as appropriate: allergies, current medications, past family history, past medical history, past social history, past surgical history, and problem list.    CURRENT PROBLEM LIST:   Patient Active Problem List   Diagnosis    Essential hypertension    Anxiety state, unspecified    Abnormal electrocardiogram    Cerebral infarction    Gastroesophageal reflux disease    Hypernatremia    Paresthesia    Slow transit constipation    Vitamin D deficiency    Overactive bladder    S/P laparoscopic cholecystectomy    Transient cerebral ischemia, unspecified type    Palpitations    Pituitary microadenoma    Dyslipidemia    Other long term (current) drug therapy    Headache    History of spinal surgery    Obstructive sleep apnea syndrome    Hiatal hernia    Osteoarthritis of knees, bilateral    S/P gastric bypass    Nutritional deficiency    Anxiety    Epigastric abdominal tenderness without rebound tenderness    Encounter for vitamin deficiency screening    Nausea    Other dysphagia    Small bowel obstruction due to adhesions    Allergic rhinitis    Chronic frontal sinusitis    Chronic sphenoidal sinusitis    Sinusitis    Primary  osteoarthritis of both knees    Labile hypertension    Postsurgical dumping syndrome    Hypercholesterolemia    History of chronic sinusitis    Mild intermittent asthma without complication    S/P laparoscopy     PAST MEDICAL HISTORY:   Past Medical History:   Diagnosis Date    Asthma     Bronchitis 01/02/2019    Chest pain 2016     Chest pain after eating x6  months--being followed by GI for GERD    Claustrophobia     MRIs    Constipation     Genital herpes     no current outbreak (10/03/15)    GERD (gastroesophageal reflux disease)     Hiatal hernia     h/o surgery    Hyperlipidemia     Hypertensive disorder     Well controlled on meds per pt. Elevated with pain.    Low back pain     Nausea without vomiting     OSA on CPAP 2015    CPAP nightly x 1 yr, used nightly.    Osteoarthritis of knees, bilateral     and back    SBO (small bowel obstruction) 09/2018    TIA (transient ischemic attack) 2014  TIA 2014-no residual. Patient evaulated for possible TIA 07/12/2015  @ [facility] (dischage summary in epic)-per summary CT of head was normal and patient was d/c'd     PAST SURGICAL HISTORY:   Past Surgical History:   Procedure Laterality Date    APPENDECTOMY (OPEN)  >25 yrs    CHOLECYSTECTOMY      EGD  01/2015    EGD N/A 01/23/2019    Procedure: EGD;  Surgeon: Champ Mungo, MD;  Location: [facility];  Service: General;  Laterality: N/A;  ENDOSCOPY  MD REQ=30MINS; Q1=UNK    EGD, BIOPSY N/A 09/15/2017    Procedure: EGD, BIOPSY;  Surgeon: Pershing Proud, MD;  Location: [facility];  Service: Gastroenterology;  Laterality: N/A;    fallopian tube surgery  >25 yrs    INSERTION, PAIN PUMP (MEDICAL) N/A 10/22/2015    Procedure: INSERTION, PAIN PUMP (MEDICAL);  Surgeon: Nicola Police, DO;  Location: [facility];  Service: General;  Laterality: N/A;    JOINT REPLACEMENT      LAPAROSCOPIC, CHOLECYSTECTOMY, CHOLANGIOGRAM  08/13/2014    LAPAROSCOPIC, GASTRIC BYPASS N/A 10/22/2015    Procedure: LAPAROSCOPIC, GASTRIC  BYPASS;  Surgeon: Josefa Half R, DO;  Location: [facility];  Service: General;  Laterality: N/A;    LAPAROSCOPIC, HERNIORRHAPHY, HIATAL N/A 10/22/2015    Procedure: LAPAROSCOPIC, HERNIORRHAPHY, HIATAL;  Surgeon: Nicola Police, DO;  Location: [facility];  Service: General;  Laterality: N/A;    LAPAROSCOPIC, LYSIS, ADHESIONS N/A 09/01/2018    Procedure: LAPAROSCOPIC, LYSIS, ADHESIONS RELEASE OF  SMALL BOWEL OBSTRUCTION;  Surgeon: Champ Mungo, MD;  Location: [facility];  Service: General;  Laterality: N/A;    LAPAROSCOPY, DIAGNOSTIC N/A 09/01/2018    Procedure: LAPAROSCOPY, DIAGNOSTIC;  Surgeon: Champ Mungo, MD;  Location: [facility];  Service: General;  Laterality: N/A;    LAPAROSCOPY, DIAGNOSTIC N/A 07/16/2021    Procedure: LAPAROSCOPY, DIAGNOSTIC;  Surgeon: Nicola Police, DO;  Location:  MAIN OR;  Service: General;  Laterality: N/A;    OVARY SURGERY Right >25 yrs    REPLACEMENT TOTAL KNEE Left     SPINE SURGERY  11/2013    cervical HNP x 2, Dr. Bufford Buttner    TOOTH EXTRACTION N/A      TOBACCO HISTORY:   Social History     Tobacco Use   Smoking Status Never   Smokeless Tobacco Never   Vaping Use   Vaping Status Never Used     ALCOHOL HISTORY:   Social History     Substance and Sexual Activity   Alcohol Use Not Currently     DRUG HISTORY:   Social History     Substance and Sexual Activity   Drug Use Never     CURRENT OUTPATIENT MEDICATIONS:   Outpatient Medications Marked as Taking for the 01/29/22 encounter (Office Visit) with Ardel Jagger R, DO   Medication Sig Dispense Refill    aspirin EC 81 MG EC tablet Take 1 tablet (81 mg total) by mouth daily. 30 tablet 1    atenolol (TENORMIN) 50 MG tablet Take 1 tablet (50 mg) by mouth 2 (two) times daily NEED FOLLOW UP APPOINTMENT 60 tablet 0    benzonatate (TESSALON) 100 MG capsule Take 1 capsule (100 mg total) by mouth 3 (three) times daily as needed for Cough 30 capsule 1    Cyanocobalamin (B-12 IJ) Inject as directed  every 30 (thirty) days         diazePAM (  VALIUM) 5 MG tablet Take 1 tablet (5 mg total) by mouth every 8 (eight) hours as needed for Anxiety 30 tablet 2    famotidine (PEPCID) 20 MG tablet Take 1 tablet (20 mg total) by mouth daily 90 tablet 1    fluticasone (FLONASE) 50 MCG/ACT nasal spray 2 sprays by Nasal route daily. (Patient taking differently: 2 sprays by Nasal route as needed) 16 g 2    losartan (COZAAR) 100 MG tablet TAKE 1 TABLET(100 MG) BY MOUTH DAILY 90 tablet 0    ondansetron (ZOFRAN-ODT) 4 MG disintegrating tablet Take 1 tablet (4 mg total) by mouth every 8 (eight) hours as needed for Nausea 30 tablet 2    oxyCODONE-acetaminophen (PERCOCET) 5-325 MG per tablet Take one to two tablets po bid as needed for pain 60 tablet 0    pantoprazole (PROTONIX) 40 MG tablet TAKE 1 TABLET(40 MG) BY MOUTH DAILY 90 tablet 0    traMADol (ULTRAM) 50 MG tablet 50 mg      valACYclovir HCL (VALTREX) 500 MG tablet TAKE 1 TABLET(500 MG) BY MOUTH DAILY 90 tablet 1     ALLERGIES:   Allergies   Allergen Reactions    Acyclovir Nausea And Vomiting     Other reaction(s): gi distress    Amlodipine      nausea    Amoxicillin-Pot Clavulanate      Other reaction(s): gi distress      Atorvastatin        Palpitation    Carvedilol      Extreme fatigue    Hydralazine      Headache      Lisinopril      nausea    Lovastatin Nausea And Vomiting     Other reaction(s): gi distress    Metronidazole Nausea And Vomiting     Other reaction(s): gi distress    Moxifloxacin Nausea And Vomiting         GI symptoms    Rosuvastatin      Palpitation, chest pain, abd pain     Statins      Chest pain, palpitations.    Sulfa Antibiotics Nausea And Vomiting     Other reaction(s): gi distress       OBJECTIVE:  BP 144/85   Pulse 65   Ht 5\' 1"    Wt 139 lb   LMP  (LMP Unknown)   BMI 26.26 kg/m     General appearance: alert, appears stated age, and cooperative  Head: Normocephalic, without obvious abnormality, atraumatic      No results found.    This note  was generated by the [facility] EMR system/Dragon speech recognition and may contain inherent errors or omissions not intended by the user. Grammatical errors, random word insertions, deletions, pronoun errors and incomplete sentences are occasional consequences of this technology due to software limitations. Not all errors are caught or corrected. If there are questions or concerns about the content of this note or information contained within the body of this dictation they should be addressed directly with the author for clarification.      Cahterine Heinzel R. Lenyx Boody, DO, FACS, FASMBS, FACOS

## 2022-01-30 ENCOUNTER — Telehealth (HOSPITAL_COMMUNITY): Payer: Self-pay

## 2022-01-30 NOTE — Telephone Encounter (Signed)
RETURN CALL: Voicemail - Detailed Message      SUBJECT:  Appointment Request     REASON FOR VISIT: Colonscopy appointment with Dr. Joesph July DATE/TIME: Mondays   ADDITIONAL INFORMATION: N/A

## 2022-01-31 ENCOUNTER — Encounter (INDEPENDENT_AMBULATORY_CARE_PROVIDER_SITE_OTHER): Payer: Self-pay | Admitting: Surgery

## 2022-01-31 DIAGNOSIS — Z9889 Other specified postprocedural states: Secondary | ICD-10-CM

## 2022-01-31 DIAGNOSIS — Z9884 Bariatric surgery status: Secondary | ICD-10-CM

## 2022-02-02 ENCOUNTER — Other Ambulatory Visit (HOSPITAL_BASED_OUTPATIENT_CLINIC_OR_DEPARTMENT_OTHER): Payer: Self-pay | Admitting: Gastroenterology

## 2022-02-02 ENCOUNTER — Other Ambulatory Visit (HOSPITAL_BASED_OUTPATIENT_CLINIC_OR_DEPARTMENT_OTHER): Payer: Self-pay | Admitting: Medical Oncology

## 2022-02-02 ENCOUNTER — Encounter (HOSPITAL_BASED_OUTPATIENT_CLINIC_OR_DEPARTMENT_OTHER): Payer: Self-pay

## 2022-02-02 ENCOUNTER — Other Ambulatory Visit: Payer: Medicare Other

## 2022-02-02 DIAGNOSIS — C50911 Malignant neoplasm of unspecified site of right female breast: Secondary | ICD-10-CM

## 2022-02-02 DIAGNOSIS — Z1211 Encounter for screening for malignant neoplasm of colon: Secondary | ICD-10-CM

## 2022-02-02 DIAGNOSIS — Z1501 Genetic susceptibility to malignant neoplasm of breast: Secondary | ICD-10-CM

## 2022-02-05 ENCOUNTER — Telehealth (HOSPITAL_COMMUNITY): Payer: Self-pay

## 2022-02-05 DIAGNOSIS — Z800 Family history of malignant neoplasm of digestive organs: Secondary | ICD-10-CM

## 2022-02-05 NOTE — Telephone Encounter (Signed)
GI Health Assessment Questions    Estimated body mass index is 30.25 kg/m as calculated from the following:    Height as of 06/23/21: '5\' 2"'$  (1.575 m).    Weight as of 06/23/21: 75 kg (165 lb 6.4 oz).      Patient Assessment      Jasmine Hunt   Have you tested POSITIVE for Covid-19 in the last 4 weeks? No  What was the date of your positive result?  Were you hospitalized due to QIONG-29 complications?   If yes to hospitalization: DO NOT SCHEDULE - send TE to Elmon Kirschner   If no to hospitalization:   Are you immunocompromised?  -  if no, ok to schedule 11 days after pt's positive date. If yes to being immunocompromised, ok to schedule 21 days after pt's positive date.     If patient is URGENT: follow urgent scheduling workflow from [facility]        Have you had a previous Endoscopy? Yes  If yes:  Where? [place] [facility]  When? 2022  Procedure Type? EUS    Are you taking any Anti-coagulant or Anti-Platelet Medications? No  If yes, which medication are you taking:   Prescribing provider:   Inform patient to call the doctor who prescribed these medicines and ask for special instructions for your blood-thinning medicines     Are you on dialysis? No  If yes to any kind of Dialysis, OK to schedule. Advised patient to come dry/drained.   [facility] - DO NOT SCHEDULE  [facility] - Inform pt to go to the lab before their procedure for a potassium blood draw, route TE to [place] for further review, add note in appointment line  [facility] - note in appointment line    Do you have a Pacemaker or Defibrillator? No  If yes, what kind of device do you have (Ex. [address], [place], [employer])?   If yes, name of Cardiologist?   . Internal [place] Cardiologist, update appt note line to "Internal Device - device type", if patient is scheduled with sedation route TE to "p [facility] Charge Nurse Pool" for further review  . External Cardiologist, update appt note line to "External Device - device type", fax device form to patient's provider, and  route TE to Charge Nurse Pool for further review  If yes to pacemaker/defibrillator, DO NOT SCHEDULE AT [facility]     Are you Diabetic?  No  Inform patient to call the doctor who prescribed these medicines and ask for special instructions for your diabetes medications     Are you a difficult IV start? No  Do you require an ultrasound machine to assist with IV start?   If yes to ultrasound start, arranged for 90 min early arrival time?   If yes to diff. IV/a few pokes, arranged for 60 min early arrival time?   If yes to ultrasound, DO NOT SCHEDULE AT [facility] & DO NOT SCHEDULE FIRST CASE OF THE DAY    Are you able to transfer to a stretcher independently? Yes  If no, DO NOT SCHEDULE AT [facility]     Is the patient scheduled for an ERCP? No  If yes, do you have an allergy to contrast, iodine or shellfish? If yes, what are your symptoms?   If yes, ok to schedule. Routed TE to Interventional Pool for further evaluation of contrast allergies?     Is the patient scheduled for an ALS PEG or PEG?  If yes, do you have a CPAP/BIPAP?   If yes, are you able to remove your CPAP or BiPAP mask on your own?   If no, arranged for an ICU bed post procedure?     Is the patient scheduled for a Therapeutic procedure?   If yes, please obtain IMAGING.    PROCEDURE        Procedure MD: Tye Savoy  Procedure Type: Colonoscopy  Procedure Date:  TBD       Time: TBD    Procedure Check-In Time: TBD    Patient Teaching    Was the topic of blood thinners taught?  Yes     Reminder that family member or friend must escort patient? Yes     What is the name of their driver? brother    How were the procedure preparation instruction materials delivered?  Verbal: Yes  On Paper: No  eCare message: No  Email: Yes  E-mail address: [email or web address]     ZipWhip Appointment Confirmation Sent: No     Does this procedure require bowel prep? Yes  If yes, were instructions for the ordered laxative taught? Yes  If yes, which RX was prescribed? Golytely  Pharmacy? [facility] in  [place] Notes:

## 2022-02-07 ENCOUNTER — Encounter (INDEPENDENT_AMBULATORY_CARE_PROVIDER_SITE_OTHER): Payer: Self-pay

## 2022-02-08 ENCOUNTER — Encounter (INDEPENDENT_AMBULATORY_CARE_PROVIDER_SITE_OTHER): Payer: Self-pay

## 2022-02-17 MED ORDER — GOLYTELY 236 G OR SOLR
ORAL | 0 refills | Status: AC
Start: 2022-02-17 — End: ?

## 2022-02-17 NOTE — Telephone Encounter (Signed)
Procedure scheduled on 7/17

## 2022-02-23 ENCOUNTER — Encounter (HOSPITAL_BASED_OUTPATIENT_CLINIC_OR_DEPARTMENT_OTHER): Payer: No Typology Code available for payment source

## 2022-02-23 ENCOUNTER — Ambulatory Visit: Payer: No Typology Code available for payment source | Attending: Medical Oncology

## 2022-02-23 DIAGNOSIS — Z1509 Genetic susceptibility to other malignant neoplasm: Secondary | ICD-10-CM | POA: Insufficient documentation

## 2022-02-23 DIAGNOSIS — C50911 Malignant neoplasm of unspecified site of right female breast: Secondary | ICD-10-CM | POA: Insufficient documentation

## 2022-02-23 DIAGNOSIS — C50912 Malignant neoplasm of unspecified site of left female breast: Secondary | ICD-10-CM | POA: Insufficient documentation

## 2022-02-23 DIAGNOSIS — Z1501 Genetic susceptibility to malignant neoplasm of breast: Secondary | ICD-10-CM | POA: Insufficient documentation

## 2022-02-23 LAB — CBC, DIFF
% Basophils: 1 %
% Eosinophils: 3 %
% Immature Granulocytes: 0 %
% Lymphocytes: 42 %
% Monocytes: 12 %
% Neutrophils: 42 %
% Nucleated RBC: 0 %
Absolute Eosinophil Count: 0.21 10*3/uL (ref 0.00–0.50)
Absolute Lymphocyte Count: 2.74 10*3/uL (ref 1.00–4.80)
Basophils: 0.05 10*3/uL (ref 0.00–0.20)
Hematocrit: 39 % (ref 36.0–45.0)
Hemoglobin: 12.3 g/dL (ref 11.5–15.5)
Immature Granulocytes: 0.01 10*3/uL (ref 0.00–0.05)
MCH: 26.7 pg — ABNORMAL LOW (ref 27.3–33.6)
MCHC: 31.9 g/dL — ABNORMAL LOW (ref 32.2–36.5)
MCV: 84 fL (ref 81–98)
Monocytes: 0.82 10*3/uL — ABNORMAL HIGH (ref 0.00–0.80)
Neutrophils: 2.76 10*3/uL (ref 1.80–7.00)
Nucleated RBC: 0 10*3/uL
Platelet Count: 393 10*3/uL (ref 150–400)
RBC: 4.6 10*6/uL (ref 3.80–5.00)
RDW-CV: 14.5 % (ref 11.0–14.5)
WBC: 6.59 10*3/uL (ref 4.3–10.0)

## 2022-02-23 LAB — COMPREHENSIVE METABOLIC PANEL
ALT (GPT): 18 U/L (ref 7–33)
AST (GOT): 23 U/L (ref 9–38)
Albumin: 4.4 g/dL (ref 3.5–5.2)
Alkaline Phosphatase (Total): 106 U/L (ref 38–172)
Anion Gap: 6 (ref 4–12)
Bilirubin (Total): 0.5 mg/dL (ref 0.2–1.3)
Calcium: 9.4 mg/dL (ref 8.9–10.2)
Carbon Dioxide, Total: 26 meq/L (ref 22–32)
Chloride: 104 meq/L (ref 98–108)
Creatinine: 0.76 mg/dL (ref 0.38–1.02)
Glucose: 92 mg/dL (ref 62–125)
Potassium: 3.9 meq/L (ref 3.6–5.2)
Protein (Total): 7.7 g/dL (ref 6.0–8.2)
Sodium: 136 meq/L (ref 135–145)
Urea Nitrogen: 17 mg/dL (ref 8–21)
eGFR by CKD-EPI 2021: >60 mL/min/{1.73_m2} (ref >59)

## 2022-02-24 ENCOUNTER — Telehealth (INDEPENDENT_AMBULATORY_CARE_PROVIDER_SITE_OTHER): Payer: Self-pay | Admitting: Internal Medicine

## 2022-02-24 NOTE — Telephone Encounter (Signed)
PA submitted via CoverMyMeds. PA was approved by insurance.         (Key: [id number])  This request has been approved.

## 2022-02-24 NOTE — Telephone Encounter (Signed)
Per pt pantoprazole (PROTONIX) 40 MG tablet requires pre-auth from insurance.

## 2022-02-26 ENCOUNTER — Ambulatory Visit
Payer: No Typology Code available for payment source | Attending: Hematology & Oncology | Admitting: Hematology & Oncology

## 2022-02-26 VITALS — BP 136/85 | HR 105 | Temp 98.2°F | Resp 16 | Wt 168.1 lb

## 2022-02-26 DIAGNOSIS — Z8042 Family history of malignant neoplasm of prostate: Secondary | ICD-10-CM | POA: Insufficient documentation

## 2022-02-26 DIAGNOSIS — Z1509 Genetic susceptibility to other malignant neoplasm: Secondary | ICD-10-CM | POA: Insufficient documentation

## 2022-02-26 DIAGNOSIS — Z800 Family history of malignant neoplasm of digestive organs: Secondary | ICD-10-CM | POA: Insufficient documentation

## 2022-02-26 DIAGNOSIS — C50911 Malignant neoplasm of unspecified site of right female breast: Secondary | ICD-10-CM

## 2022-02-26 DIAGNOSIS — C50912 Malignant neoplasm of unspecified site of left female breast: Secondary | ICD-10-CM

## 2022-02-26 DIAGNOSIS — Z1501 Genetic susceptibility to malignant neoplasm of breast: Secondary | ICD-10-CM | POA: Insufficient documentation

## 2022-02-26 DIAGNOSIS — Z08 Encounter for follow-up examination after completed treatment for malignant neoplasm: Secondary | ICD-10-CM | POA: Insufficient documentation

## 2022-02-26 DIAGNOSIS — Z853 Personal history of malignant neoplasm of breast: Secondary | ICD-10-CM | POA: Insufficient documentation

## 2022-02-26 DIAGNOSIS — Z803 Family history of malignant neoplasm of breast: Secondary | ICD-10-CM | POA: Insufficient documentation

## 2022-02-26 NOTE — Progress Notes (Signed)
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  Initially she was tested -BRCA1 mutation and she underwent bilateral salpingo-oophorectomy and she has been followed by GI with endoscopies and pancreatic MRI.      -No new changes.  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  She also received radiation therapy.  -2006: She developed stage I left breast carcinoma also triple negative and she underwent lumpectomy followed by chemotherapy with CMF followed by radiation therapy.  -Patient initially had tested negative for genetic testing but she was referred to [facility] and had more in-depth testing and it turns out that she was BRCA positive and she underwent bilateral salpingo-oophorectomy.  For the last few years patient is also following with GI at [facility] and she has had EGDs down to the pancreas and last year she had MRI of the pancreas that was negative and the plan is now to alternate EGD and MRI.  -It turns out she has strong family history of cancer with her father had prostate cancer in his 60s, mother diagnosed with pancreatic cancer at age 61.  1 sister  had breast cancer x2 and another sister had breast cancer and also pancreatic cancer.  -2016 and 2020 patient had breast biopsies that were negative.    CURRENT TREATMENT  Surveillance    INTERVAL HISTORY    Last seen 06/23/2021:   Doing quite well.  She did have an episode of possible low GI bleeding with hematochezia.  She will be seeing Dr. Tye Savoy at [facility] for colonoscopy in June 2023.  Otherwise, still working full-time for the [employer].  Doing well.  Exercising daily    PAST MEDICAL HISTORY    Patient Active Problem List   Diagnosis   . Abdominal pain, generalized   . Cyst and pseudocyst of pancreas   . Family history of malignant neoplasm of gastrointestinal tract   . Infiltrating ductal carcinoma of bilateral female breasts ([facility])   . BRCA1 gene mutation positive           PAST SURGICAL HISTORY    Past Surgical History:   Procedure Laterality Date   . BREAST LUMPECTOMY      2001 & 2006   . HYSTERECTOMY  2008   . SHOULDER SURGERY  2015           FAMILY HISTORY    Strong family history with numerous family members with BRCA 1 positivity    SOCIAL HISTORY    Social History     Social History Narrative   . Not on file           ALLERGIES    Review  of patient's allergies indicates:  Allergies   Allergen Reactions   . Dust Mite Mixed Allergen Ext [Mite (D. Farinae)] Unknown     Allergy test came back positive for dust, reaction is unknown.         MEDICATIONS      Current Outpatient Medications:   .  amLODIPine 5 MG tablet, Take 5 mg by mouth daily., Disp: , Rfl:   .  atorvastatin 20 MG tablet, Take 20 mg by mouth at bedtime., Disp: , Rfl:   .  cholecalciferol 50 mcg (2,000 unit) capsule, Take 2,000 units by mouth daily., Disp: , Rfl:   .  Coenzyme Q10 (COQ-10 OR), Take 1 tablet by mouth as needed., Disp: , Rfl:   .  Cyanocobalamin (VITAMIN B-12 OR), Take 1 tablet by mouth as needed., Disp: , Rfl:   .  mometasone 50 MCG/ACT nasal spray, Spray 2 sprays in the nostril as needed., Disp: , Rfl:   .  Multiple Vitamin  (Multi-Vitamin) tablet, Take 1 tablet by mouth daily., Disp: , Rfl:   .  Omega-3 Fatty Acids (OMEGA 3 OR), Take 1 tablet by mouth as needed., Disp: , Rfl:   .  OMEPRAZOLE OR, Take 1 tablet by mouth as needed., Disp: , Rfl:   .  polyethylene glycol 3350 with electrolytes (Golytely) 236 g solution, Take per instructions already provided to patient for bowel prep., Disp: 4000 mL, Rfl: 0      REVIEW OF SYSTEMS  A full ROS was obtained as part of the intake form, which has been reviewed and signed.   As per Interval History above, a complete ROS is otherwise negative.      PHYSICAL EXAM    BP 136/85   Pulse (!) 105   Temp 36.8 C   Resp 16   Wt 76.2 kg (168 lb 1.6 oz)   SpO2 96%   BMI 30.75 kg/m     ECOG: 0  General: Pleasant, well-appearing. No acute distress.  HEENT: Sclerae anicteric. Oral mucosa without lesion.   Cardiac: Regular rate and rhythm.  Lungs: Clear to auscultation bilaterally. Breathing unlabored.  Breast: Well-healed the scars over both breasts.  No palpable masses.  No nipple changes and no palpable bilateral axillary nodes.  Abdomen: Bowel sounds present. Soft, nontender, nondistended.   Neuro: Alert and oriented x4. No focal deficits.  Skin: Without rash.  Extremities: No LE edema.        LABORATORY RESULTS    Results for orders placed or performed in visit on 02/23/22   CBC with Differential   Result Value Ref Range    WBC 6.59 4.3 - 10.0 10*3/uL    RBC 4.60 3.80 - 5.00 10*6/uL    Hemoglobin 12.3 11.5 - 15.5 g/dL    Hematocrit 39 36.0 - 45.0 %    MCV 84 81 - 98 fL    MCH 26.7 (L) 27.3 - 33.6 pg    MCHC 31.9 (L) 32.2 - 36.5 g/dL    Platelet Count 393 150 - 400 10*3/uL    RDW-CV 14.5 11.0 - 14.5 %    % Neutrophils 42 %    % Lymphocytes 42 %    % Monocytes 12 %    % Eosinophils 3 %    % Basophils 1 %    % Immature Granulocytes 0 %    Neutrophils 2.76 1.80 - 7.00 10*3/uL    Absolute Lymphocyte Count 2.74 1.00 - 4.80 10*3/uL    Monocytes 0.82 (H) 0.00 -  0.80 10*3/uL    Absolute Eosinophil Count  0.21 0.00 - 0.50 10*3/uL    Basophils 0.05 0.00 - 0.20 10*3/uL    Immature Granulocytes 0.01 0.00 - 0.05 10*3/uL    Nucleated RBC 0.00 0.00 10*3/uL    % Nucleated RBC 0 %   Comprehensive Metabolic Panel   Result Value Ref Range    Sodium 136 135 - 145 meq/L    Potassium 3.9 3.6 - 5.2 meq/L    Chloride 104 98 - 108 meq/L    Carbon Dioxide, Total 26 22 - 32 meq/L    Anion Gap 6 4 - 12    Glucose 92 62 - 125 mg/dL    Urea Nitrogen 17 8 - 21 mg/dL    Creatinine 0.76 0.38 - 1.02 mg/dL    Protein (Total) 7.7 6.0 - 8.2 g/dL    Albumin 4.4 3.5 - 5.2 g/dL    Bilirubin (Total) 0.5 0.2 - 1.3 mg/dL    Calcium 9.4 8.9 - 10.2 mg/dL    AST (GOT) 23 9 - 38 U/L    Alkaline Phosphatase (Total) 106 38 - 172 U/L    ALT (GPT) 18 7 - 33 U/L    eGFR by CKD-EPI 2021 >60 >59 mL/min/1.73_m2           DIAGNOSTIC STUDIES    No orders to display       Bilateral screening MRI performed at [facility] on 02/23/2022 revealed no abnormalities.  BI-RADS 2.  Stable.    I spent a total of 30 minutes for the patient's care on the date of the service.    Ammie Ferrier, MD  Gastrointestinal Medical Oncology       This note was completed at least in part with voice recognition software.

## 2022-03-03 ENCOUNTER — Encounter (INDEPENDENT_AMBULATORY_CARE_PROVIDER_SITE_OTHER): Payer: Self-pay | Admitting: Internal Medicine

## 2022-03-09 ENCOUNTER — Encounter (INDEPENDENT_AMBULATORY_CARE_PROVIDER_SITE_OTHER): Payer: Self-pay

## 2022-03-10 ENCOUNTER — Encounter (INDEPENDENT_AMBULATORY_CARE_PROVIDER_SITE_OTHER): Payer: Self-pay

## 2022-03-12 ENCOUNTER — Ambulatory Visit (INDEPENDENT_AMBULATORY_CARE_PROVIDER_SITE_OTHER): Payer: Medicare Other | Admitting: Internal Medicine

## 2022-03-12 ENCOUNTER — Encounter (INDEPENDENT_AMBULATORY_CARE_PROVIDER_SITE_OTHER): Payer: Self-pay | Admitting: Internal Medicine

## 2022-03-12 VITALS — BP 162/96 | HR 52 | Temp 98.2°F | Wt 141.0 lb

## 2022-03-12 DIAGNOSIS — Z1231 Encounter for screening mammogram for malignant neoplasm of breast: Secondary | ICD-10-CM

## 2022-03-12 DIAGNOSIS — Z9889 Other specified postprocedural states: Secondary | ICD-10-CM

## 2022-03-12 DIAGNOSIS — K219 Gastro-esophageal reflux disease without esophagitis: Secondary | ICD-10-CM

## 2022-03-12 DIAGNOSIS — I10 Essential (primary) hypertension: Secondary | ICD-10-CM

## 2022-03-12 NOTE — Progress Notes (Signed)
Have you seen any specialists/other providers since your last visit with [place]?    No    Health Maintenance Due   Topic Date Due    Tetanus Ten-Year  02/25/2009    Pneumonia Vaccine Age 70+ (2 - PPSV23 if available, else PCV20) 01/23/2019    MAMMOGRAM  01/02/2020    Medicare Annual Wellness Visit  03/28/2020    COVID-19 Vaccine (3 - Booster for Pfizer series) 04/23/2020    DEPRESSION SCREENING  07/16/2021

## 2022-03-12 NOTE — Progress Notes (Signed)
Subjective:       Patient ID: Amber [name] is a 71 y.o. female.    HPI  Pt is here for follow up     HTN, card rx'd clonidine patch, but pt was unable to take due to SE's,   Home BP 160's/90's, pt attributed to her knee pain, declined more BP med    DJD, sees pain management, rx'd gabapentin prn,     GERD, stable, takes pepcid prn, no dysphagia, takes protonix daily    Hyperlipidemia, unable to take statins  Lab Results   Component Value Date    CHOL 185 01/23/2022    CHOL 196 10/04/2019    CHOL 187 08/16/2018     Lab Results   Component Value Date    HDL 62 01/23/2022    HDL 73 10/04/2019    HDL 80 08/16/2018     Lab Results   Component Value Date    LDL 115 (H) 01/23/2022    LDL 115 (H) 10/04/2019    LDL 92 08/16/2018     Lab Results   Component Value Date    TRIG 39 01/23/2022    TRIG 40 10/04/2019    TRIG 61 08/16/2018           The following portions of the patient's history were reviewed and updated as appropriate: allergies, current medications, past family history, past medical history, past social history, past surgical history, and problem list.    Review of Systems   Constitutional:  Negative for appetite change and unexpected weight change.   Respiratory:  Negative for shortness of breath.    Cardiovascular:  Negative for chest pain and palpitations.   Gastrointestinal:  Negative for abdominal pain, nausea and vomiting.   Genitourinary:  Negative for dysuria.   Musculoskeletal:  Positive for arthralgias. Negative for myalgias.   Neurological:  Negative for syncope, light-headedness and headaches.     BP (!) 162/96 (BP Site: Right arm, Patient Position: Sitting)   Pulse (!) 52   Temp 98.2 F (36.8 C) (Temporal)   Wt 64 kg (141 lb)   LMP  (LMP Unknown)   SpO2 98%   BMI 26.64 kg/m        Objective:    Physical Exam  Vitals reviewed.   Constitutional:       General: She is not in acute distress.     Appearance: She is well-developed.   Eyes:      General: No scleral icterus.      Conjunctiva/sclera: Conjunctivae normal.   Neck:      Thyroid: No thyromegaly.      Vascular: No carotid bruit or JVD.   Cardiovascular:      Rate and Rhythm: Normal rate and regular rhythm.      Heart sounds: Normal heart sounds. No murmur heard.     No friction rub. No gallop.   Pulmonary:      Effort: Pulmonary effort is normal. No respiratory distress.      Breath sounds: Normal breath sounds. No rales.   Abdominal:      General: Bowel sounds are normal. There is no distension.      Palpations: Abdomen is soft.      Tenderness: There is no abdominal tenderness. There is no guarding or rebound.   Musculoskeletal:         General: Tenderness present.      Cervical back: Neck supple.      Right lower leg: No edema.  Left lower leg: No edema.   Neurological:      Mental Status: She is alert.             Assessment:       1. Essential hypertension        2. Gastroesophageal reflux disease, unspecified whether esophagitis present        3. S/P knee surgery        4. White coat syndrome with diagnosis of hypertension        5. Encounter for screening mammogram for malignant neoplasm of breast  Mammo Digital 2D Screening Bilateral W CAD            Plan:      Procedures  No orders of the defined types were placed in this encounter.      Continue meds  Low carb, low chol, low calorie diet, weight reduction   Monitor home BP  Rx refills.   Further recommendations pending test results.  F/u 6 months or sooner prn

## 2022-03-19 ENCOUNTER — Other Ambulatory Visit (INDEPENDENT_AMBULATORY_CARE_PROVIDER_SITE_OTHER): Payer: Self-pay | Admitting: Internal Medicine

## 2022-03-24 ENCOUNTER — Ambulatory Visit (INDEPENDENT_AMBULATORY_CARE_PROVIDER_SITE_OTHER): Payer: Medicare Other | Admitting: Cardiology

## 2022-03-25 ENCOUNTER — Encounter (INDEPENDENT_AMBULATORY_CARE_PROVIDER_SITE_OTHER): Payer: Self-pay | Admitting: Cardiology

## 2022-04-09 ENCOUNTER — Encounter (INDEPENDENT_AMBULATORY_CARE_PROVIDER_SITE_OTHER): Payer: Self-pay

## 2022-04-10 ENCOUNTER — Encounter (INDEPENDENT_AMBULATORY_CARE_PROVIDER_SITE_OTHER): Payer: Self-pay

## 2022-04-15 ENCOUNTER — Ambulatory Visit (INDEPENDENT_AMBULATORY_CARE_PROVIDER_SITE_OTHER): Payer: Medicare Other | Admitting: Internal Medicine

## 2022-04-15 ENCOUNTER — Encounter (INDEPENDENT_AMBULATORY_CARE_PROVIDER_SITE_OTHER): Payer: Self-pay | Admitting: Internal Medicine

## 2022-04-15 VITALS — BP 150/70 | HR 54 | Temp 97.3°F | Ht 61.0 in | Wt 146.0 lb

## 2022-04-15 DIAGNOSIS — I10 Essential (primary) hypertension: Secondary | ICD-10-CM

## 2022-04-15 LAB — ECG 12-LEAD
Atrial Rate: 56 {beats}/min
P Axis: 11 degrees
P-R Interval: 136 ms
Q-T Interval: 412 ms
QRS Duration: 80 ms
QTC Calculation (Bezet): 397 ms
R Axis: -16 degrees
T Axis: 16 degrees
Ventricular Rate: 56 {beats}/min

## 2022-04-15 MED ORDER — CHLORTHALIDONE 25 MG PO TABS
12.5000 mg | ORAL_TABLET | Freq: Every day | ORAL | 0 refills | Status: DC
Start: 2022-04-15 — End: 2022-06-16

## 2022-04-15 NOTE — Progress Notes (Signed)
[facility] OFFICE CONSULTATION    I had the pleasure of seeing [name] today for cardiovascular evaluation. She is a pleasant 71 y.o. female with a history of hypertension, history of TIA, obesity status post gastric bypass surgery 2015, OSA who presents for hospital follow-up.  She was seen in [facility] ED 03/31/2022 with elevated blood pressure readings.  BP at her orthopedic doctor's office was 200/100.  She had been having dizziness.  She was given a dose of hydrochlorothiazide in the emergency room, blood pressure improved.  She has no complaints today.  She has been struggling with significant knee pain post knee surgery for over 1 year.  She takes gabapentin as needed.  She has not been taking NSAIDs.  She follows a low-sodium diet.  She is compliant with losartan and atenolol at home.  Clonidine was ordered by Dr. Katrinka Blazing at her previous cardiology office visit.  She reports dizziness with clonidine patch and discontinued the patch after 4 days of use.  Blood pressure remains elevated.  She is unable to exercise regularly due to knee pain.  She tries to walk.          PAST MEDICAL HISTORY: She has a past medical history of Asthma, Bronchitis (01/02/2019), Chest pain (2016), Claustrophobia, Constipation, Genital herpes, GERD (gastroesophageal reflux disease), Hiatal hernia, Hyperlipidemia, Hypertensive disorder, Low back pain, Nausea without vomiting, OSA on CPAP (2015), Osteoarthritis of knees, bilateral, SBO (small bowel obstruction) (09/2018), and TIA (transient ischemic attack) (2014). She has a past surgical history that includes Ovary surgery (Right, >25 yrs); fallopian tube surgery (>25 yrs); LAPAROSCOPIC, CHOLECYSTECTOMY, CHOLANGIOGRAM (08/13/2014); APPENDECTOMY (OPEN) (>25 yrs); Spine surgery (11/2013); EGD (01/2015); LAPAROSCOPIC, GASTRIC BYPASS (N/A, 10/22/2015); INSERTION, PAIN PUMP (MEDICAL) (N/A, 10/22/2015); LAPAROSCOPIC, HERNIORRHAPHY, HIATAL (N/A, 10/22/2015);  Cholecystectomy; EGD, BIOPSY (N/A, 09/15/2017); LAPAROSCOPY, DIAGNOSTIC (N/A, 09/01/2018); LAPAROSCOPIC, LYSIS, ADHESIONS (N/A, 09/01/2018); EGD (N/A, 01/23/2019); Joint replacement; Tooth extraction (N/A); LAPAROSCOPY, DIAGNOSTIC (N/A, 07/16/2021); and Replacement total knee (Left).        MEDICATIONS:   Current Outpatient Medications   Medication Sig Dispense Refill    aspirin EC 81 MG EC tablet Take 1 tablet (81 mg total) by mouth daily. 30 tablet 1    atenolol (TENORMIN) 50 MG tablet Take 1 tablet (50 mg) by mouth 2 (two) times daily 180 tablet 1    Cyanocobalamin (B-12 IJ) Inject as directed every 30 (thirty) days         diazePAM (VALIUM) 5 MG tablet Take 1 tablet (5 mg total) by mouth every 8 (eight) hours as needed for Anxiety 30 tablet 2    famotidine (PEPCID) 20 MG tablet Take 1 tablet (20 mg total) by mouth daily (Patient taking differently: Take 1 tablet (20 mg) by mouth as needed) 90 tablet 1    fluticasone (FLONASE) 50 MCG/ACT nasal spray 2 sprays by Nasal route daily. (Patient taking differently: 2 sprays by Nasal route as needed) 16 g 2    gabapentin (NEURONTIN) 300 MG capsule daily as needed      losartan (COZAAR) 100 MG tablet TAKE 1 TABLET(100 MG) BY MOUTH DAILY 90 tablet 0    Multiple Vitamin (MULTIVITAMIN) capsule Take 1 capsule by mouth daily      ondansetron (ZOFRAN-ODT) 4 MG disintegrating tablet Take 1 tablet (4 mg total) by mouth every 8 (eight) hours as needed for Nausea 30 tablet 2    pantoprazole (PROTONIX) 40 MG tablet TAKE 1 TABLET(40 MG) BY MOUTH DAILY 90 tablet 0    valACYclovir HCL (VALTREX)  500 MG tablet TAKE 1 TABLET(500 MG) BY MOUTH DAILY 90 tablet 1    vitamin D, ergocalciferol, (DRISDOL) 50000 UNIT Cap Take 1 capsule (50,000 Units) by mouth once a week 12 capsule 1     No current facility-administered medications for this visit.           ALLERGIES:   Allergies   Allergen Reactions    Acyclovir Nausea And Vomiting     Other reaction(s): gi distress    Amlodipine      nausea     Amoxicillin-Pot Clavulanate      Other reaction(s): gi distress      Atorvastatin        Palpitation    Carvedilol      Extreme fatigue    Hydralazine      Headache      Lisinopril      nausea    Lovastatin Nausea And Vomiting     Other reaction(s): gi distress    Metronidazole Nausea And Vomiting     Other reaction(s): gi distress    Moxifloxacin Nausea And Vomiting         GI symptoms    Rosuvastatin      Palpitation, chest pain, abd pain     Statins      Chest pain, palpitations.    Sulfa Antibiotics Nausea And Vomiting     Other reaction(s): gi distress         FAMILY HISTORY: Her family history includes Breast cancer in her mother; Cancer (age of onset: 29) in her mother; Heart disease in her father.      SOCIAL HISTORY: She reports that she has never smoked. She has never used smokeless tobacco. She reports that she does not currently use alcohol. She reports that she does not use drugs.      REVIEW OF SYSTEMS: All other systems reviewed and negative except as above.         PHYSICAL EXAMINATION  General Appearance:  A well-appearing female in no acute distress.    Vital Signs: BP 150/70(BP Site: Left arm, Patient Position: Sitting, Cuff Size: Medium)   Pulse (!) 54   Temp 97.3 F (36.3 C)   Ht 1.549 m (5\' 1" )   Wt 66.2 kg (146 lb)   LMP  (LMP Unknown)   SpO2 99%   BMI 27.59 kg/m    HEENT: Sclera anicteric, conjunctiva without pallor, moist mucous membranes, normal dentition. No arcus.   Neck:  Supple without jugular venous distention. Thyroid nonpalpable. Normal carotid upstrokes without bruits.   Chest: Clear to auscultation bilaterally with good air movement and respiratory effort and no wheezes, rales, or rhonchi   Cardiovascular: Normal S1 and physiologically split S2 without murmurs. No gallops or rub. PMI of normal size and nondisplaced.   Abdomen: Soft, nontender, nondistended, with normoactive bowel sounds. No organomegaly.  No pulsatile masses, or bruits.   Extremities: Warm without  edema  Skin: No rash, xanthoma or xanthelasma.   Neuro: Alert and oriented x3. Grossly intact. Strength is symmetrical. Normal mood and affect.           ECG: 12/23/2021, I have independently reviewed the tracing, sinus rhythm, LVH with nonspecific ST changes.    ECG today, I have independently reviewed the tracing, sinus bradycardia 56 bpm, LVH.        LABS:   Lab Results   Component Value Date    WBC 4.03 01/23/2022    HGB 14.6 01/23/2022  HCT 46.4 (H) 01/23/2022    PLT 307 01/23/2022    NA 142 01/23/2022    K 5.3 01/23/2022    MG 2.0 06/16/2018    BUN 11.0 01/23/2022    CREAT 0.7 01/23/2022    GLU 87 01/23/2022    AST 18 01/23/2022    ALT 14 01/23/2022    HGBA1C 4.8 11/25/2020    TSH 1.09 10/04/2019    TROPI <0.01 10/06/2020    DDIMER 0.28 01/24/2018     Recent Labs     01/23/22  0945 10/04/19  0956 08/16/18  0734 01/25/18  0506 11/10/17  0740   Cholesterol 185 196 187 151 175   Triglycerides 39 40 61 26* 53   HDL 62 73 80 53 72   LDL Calculated 115* 115* 92 93 89          ECHOCARDIOGRAM: 04/04/2020    Summary    * LVEF is 60-65%..    * Mild concentric LVH, LV wall thickness 1.1 cm..    * Normal RV size and systolic function.    * Mild aortic sclerosis.    * No pericardial effusion.    * Compared to the prior study dated 11/04/2017, there has been no significant  change.        Nuclear stress test 05/30/2021 in care everywhere    1) Normal myocardial perfusion study   2) normal ejection fraction      Cardiac catheterization 01/10/2014    DESCRIPTION OF PROCEDURE:   After informed consent was obtained, the right radial site was utilized.  A 5-French sheath was placed, intra-arterial nitroglycerin was given.  A  TIG catheter was used to engage the left and right coronary artery.  A pigtail catheter was advanced across the aortic valve using10 mL, total volume of 20 mL.  A pullback was recorded across the aortic valve.  Final pressure recorded.  The TR Band was removed.  Nitroglycerin was given, in the sheath TR band was  applied without difficulty.  The patient tolerated the procedure well without any complications.  A 60 mL of contrast was utilized.     Left coronary angiography demonstrates left main bifurcates the LAD.  The LAD was widely patent without any significant epicardial disease noted, bifurcates into a small diagonal branch, septal perforators.  The circ marginal branch is a large vessel, left lateral wall, no other high-grade lesions are seen.     The right coronary artery is a tortuous large vessel, bifurcates PDA and posterolateral branch.  No other high-grade lesions are seen.     LV angiography demonstrates normal LV systolic function, EF of 60%.     CONCLUSION:   Normal LV systolic function, normal left and right coronary angiography.  The patient is to follow up with the primary care physician.  From a cardiac standpoint, no further cardiac intervention is anticipated at this time.      IMPRESSION/RECOMMENDATIONS:       Hypertensive urgency/hypertension -poorly controlled.  Patient has a longstanding history of multiple side effects to multiple antihypertensives.  Some appear to be secondary to GI issues post gastric bypass surgery.  She is tolerating losartan and atenolol at this time.  She does complain of sleepiness after taking losartan.  She was seen in the [facility] emergency room 03/31/2022 with hypertensive urgency.  She was given hydrochlorothiazide with improvement in blood pressure reading.  She was not discharged on hydrochlorothiazide.  Records from [facility] ED reviewed.  ECG without acute changes,  no acute process on chest x-ray.  High-sensitivity troponin was negative, less than 6.  Sodium 143 potassium 3.9 creatinine 0.4, BUN 10.  CBC was normal with hemoglobin 14.0 platelets 242.  Normal TSH 1.9 08 May 2021.  Echocardiogram in 2021 showed preserved ejection fraction 60 to 65%, mild left ventricular wall thickness, 1.1 cm.  Counseled on low-sodium diet, exercise as tolerated.  Continue to avoid  NSAIDs.  Continue with management of underlying OSA.  Blood pressure goal of 120/70 discussed.  We will continue with losartan and atenolol.  No side effects with hydrochlorothiazide given in the emergency room.  She reports history of dehydration with hydrochlorothiazide in the past.  Will start low-dose chlorthalidone 12.5 mg p.o. daily.  Patient advised to keep well-hydrated.  Will repeat BMP in 4 weeks.  Importance of good blood pressure control has been discussed in detail with patient.    History of TIA maintained on aspirin 81 mg p.o. daily    History of obesity status post gastric bypass surgery over 8 years ago    OSA compliant with CPAP    Hyperlipidemia -LDL 115 March 2023.  Recommend low-cholesterol diet.  LDL goal 70-100 in the setting of TIA.Marland Kitchen  She reports intolerance to statins.  May need to consider addition of Zetia.    Abnormal ECG -nonspecific ST changes on ECG  12/2021 -not present on ECG today.  Likely repolarization changes in the setting of LVH.  Patient has no chest pain or shortness of breath.  Normal coronaries by cardiac catheterization 2015.  Normal nuclear stress test 05/2021.  Continue with risk factor modification.  Continue with blood pressure control as above.      More than 40 minutes spent, greater than 50% of time spent face-to-face in discussion of hypertension and management.  Previous cardiology records, PCP records and ED records reviewed.  Prior cardiac testing in epic and care everywhere at [facility] have been reviewed.    Keep follow-up appointment with Dr. Katrinka Blazing in August.      Rich Number, MD  04/15/2022, 9:55 AM  [facility]  [phone]

## 2022-04-15 NOTE — Patient Instructions (Addendum)
Please get blood work in one month.  Keep appointment with Dr Katrinka Blazing in August.

## 2022-04-16 ENCOUNTER — Ambulatory Visit (INDEPENDENT_AMBULATORY_CARE_PROVIDER_SITE_OTHER): Payer: Medicare Other | Admitting: Internal Medicine

## 2022-04-19 ENCOUNTER — Other Ambulatory Visit (INDEPENDENT_AMBULATORY_CARE_PROVIDER_SITE_OTHER): Payer: Self-pay | Admitting: Internal Medicine

## 2022-04-19 DIAGNOSIS — K219 Gastro-esophageal reflux disease without esophagitis: Secondary | ICD-10-CM

## 2022-04-21 ENCOUNTER — Ambulatory Visit (INDEPENDENT_AMBULATORY_CARE_PROVIDER_SITE_OTHER): Payer: Medicare Other | Admitting: Registered Nurse

## 2022-05-04 ENCOUNTER — Encounter (HOSPITAL_BASED_OUTPATIENT_CLINIC_OR_DEPARTMENT_OTHER): Payer: Self-pay

## 2022-05-09 ENCOUNTER — Encounter (INDEPENDENT_AMBULATORY_CARE_PROVIDER_SITE_OTHER): Payer: Self-pay

## 2022-05-10 ENCOUNTER — Encounter (INDEPENDENT_AMBULATORY_CARE_PROVIDER_SITE_OTHER): Payer: Self-pay

## 2022-05-18 ENCOUNTER — Ambulatory Visit (HOSPITAL_COMMUNITY): Payer: No Typology Code available for payment source | Admitting: Gastroenterology

## 2022-05-18 ENCOUNTER — Encounter (HOSPITAL_BASED_OUTPATIENT_CLINIC_OR_DEPARTMENT_OTHER): Payer: Self-pay | Admitting: Gastroenterology

## 2022-05-18 ENCOUNTER — Encounter (HOSPITAL_COMMUNITY): Payer: Self-pay | Admitting: Student in an Organized Health Care Education/Training Program

## 2022-06-09 ENCOUNTER — Encounter (INDEPENDENT_AMBULATORY_CARE_PROVIDER_SITE_OTHER): Payer: Self-pay

## 2022-06-10 ENCOUNTER — Encounter (INDEPENDENT_AMBULATORY_CARE_PROVIDER_SITE_OTHER): Payer: Self-pay

## 2022-06-10 ENCOUNTER — Encounter (INDEPENDENT_AMBULATORY_CARE_PROVIDER_SITE_OTHER): Payer: Self-pay | Admitting: Internal Medicine

## 2022-06-16 ENCOUNTER — Encounter (INDEPENDENT_AMBULATORY_CARE_PROVIDER_SITE_OTHER): Payer: Self-pay | Admitting: Cardiology

## 2022-06-16 ENCOUNTER — Ambulatory Visit (INDEPENDENT_AMBULATORY_CARE_PROVIDER_SITE_OTHER): Payer: Medicare Other | Admitting: Cardiology

## 2022-06-16 VITALS — BP 174/94 | HR 96 | Ht 61.0 in | Wt 154.0 lb

## 2022-06-16 DIAGNOSIS — I10 Essential (primary) hypertension: Secondary | ICD-10-CM

## 2022-06-16 DIAGNOSIS — I517 Cardiomegaly: Secondary | ICD-10-CM

## 2022-06-16 LAB — ECG 12-LEAD
Atrial Rate: 63 {beats}/min
IHS MUSE NARRATIVE AND IMPRESSION: NORMAL
P Axis: 18 degrees
P-R Interval: 144 ms
Q-T Interval: 412 ms
QRS Duration: 84 ms
QTC Calculation (Bezet): 421 ms
R Axis: 0 degrees
T Axis: 20 degrees
Ventricular Rate: 63 {beats}/min

## 2022-06-16 MED ORDER — HYDROCHLOROTHIAZIDE 12.5 MG PO TABS
12.5000 mg | ORAL_TABLET | Freq: Every day | ORAL | 3 refills | Status: DC
Start: 2022-06-16 — End: 2022-09-10

## 2022-06-16 MED ORDER — LOSARTAN POTASSIUM 100 MG PO TABS
100.0000 mg | ORAL_TABLET | Freq: Every day | ORAL | 3 refills | Status: DC
Start: 2022-06-16 — End: 2022-09-10

## 2022-06-16 MED ORDER — ATENOLOL 50 MG PO TABS
50.0000 mg | ORAL_TABLET | Freq: Two times a day (BID) | ORAL | 3 refills | Status: DC
Start: 2022-06-16 — End: 2022-09-10

## 2022-06-16 NOTE — Progress Notes (Signed)
Amber [name] 71 y.o. with a history of HTN presents for cardiac follow up of hypertension.     Since her gastric surgery, she has been intolerant to different medications due to GI distress.       Hypertension: Multiple medication intolerances since bariatric surgery.  Currently she is on losartan 100 mg daily and atenolol 50 mg bid.  She was intolerant to the recently prescribed hydrochlorothiazide, nifedipine ( coughing).   Blood pressure control has still mildly uncontrolled. She also complains of significant nausea when taking her losartan.     Cerebrovascular disease: History of a TIA.  She has had no recurrence.  This TIA occurred when she was not on aspirin.  Currently compliant with aspirin.      Cardiac work up has included     Echocardiogram (04/04/2020): Normal LV/RV size and systolic function.  Ejection fraction 60%.  Mild left ventricular hypertrophy.  No significant valvular heart disease.  No pericardial effusion.                    - stress test (11/06/17 - normal myocardial perfusion)               - cardiac cath ( 01/08/14 - normal )              - echocardiogram ( EF 55%, mild TR)        Current Outpatient Medications   Medication Sig Dispense Refill    aspirin EC 81 MG EC tablet Take 1 tablet (81 mg total) by mouth daily. 30 tablet 1    Cyanocobalamin (B-12 IJ) Inject as directed every 30 (thirty) days         diazePAM (VALIUM) 5 MG tablet Take 1 tablet (5 mg total) by mouth every 8 (eight) hours as needed for Anxiety 30 tablet 2    famotidine (PEPCID) 20 MG tablet Take 1 tablet (20 mg total) by mouth daily (Patient taking differently: Take 1 tablet (20 mg) by mouth as needed) 90 tablet 1    fluticasone (FLONASE) 50 MCG/ACT nasal spray 2 sprays by Nasal route daily. (Patient taking differently: 2 sprays by Nasal route as needed) 16 g 2    Multiple Vitamin (MULTIVITAMIN) capsule Take 1 capsule by mouth daily      ondansetron (ZOFRAN-ODT) 4 MG disintegrating tablet Take 1 tablet (4 mg total) by  mouth every 8 (eight) hours as needed for Nausea 30 tablet 2    pantoprazole (PROTONIX) 40 MG tablet TAKE 1 TABLET(40 MG) BY MOUTH DAILY 90 tablet 0    valACYclovir HCL (VALTREX) 500 MG tablet TAKE 1 TABLET(500 MG) BY MOUTH DAILY 90 tablet 1    vitamin D, ergocalciferol, (DRISDOL) 50000 UNIT Cap Take 1 capsule (50,000 Units) by mouth once a week 12 capsule 1    atenolol (TENORMIN) 50 MG tablet Take 1 tablet (50 mg) by mouth 2 (two) times daily 180 tablet 3    gabapentin (NEURONTIN) 300 MG capsule daily as needed (Patient not taking: Reported on 06/16/2022)      hydroCHLOROthiazide (HYDRODIURIL) 12.5 MG tablet Take 1 tablet (12.5 mg) by mouth daily 90 tablet 3    losartan (COZAAR) 100 MG tablet Take 1 tablet (100 mg) by mouth daily 90 tablet 3     No current facility-administered medications for this visit.            PE:    Vitals:    06/16/22 0850   BP: (!) 174/94   Pulse:  96   SpO2:      Body mass index is 29.1 kg/m.    Physical Examination: General appearance - alert, well appearing, and in no distress  Mental status - alert, oriented to person, place, and time  Chest - clear to auscultation, no wheezes, rales or rhonchi, symmetric air entry  Heart - normal rate and regular rhythm, S1 and S2 normal  Abdomen - soft, nontender, nondistended, no masses or organomegaly  Extremities - no pedal edema noted        Labs:  Lipid Panel   Cholesterol   Date/Time Value Ref Range Status   01/23/2022 09:45 AM 185 0 - 199 mg/dL Final     Triglycerides   Date/Time Value Ref Range Status   01/23/2022 09:45 AM 39 34 - 149 mg/dL Final     HDL   Date/Time Value Ref Range Status   01/23/2022 09:45 AM 62 40 - 9,999 mg/dL Final     Comment:     An HDL cholesterol <40 mg/dL is low and constitutes a  coronary heart disease risk factor, and HDL-C>59 mg/dL is  a negative risk factor for CHD.  Ref: American Heart Association; Circulation 2004         CMP:   Sodium   Date/Time Value Ref Range Status   01/23/2022 09:45 AM 142 135 - 145 mEq/L  Final     Potassium   Date/Time Value Ref Range Status   01/23/2022 09:45 AM 5.3 3.5 - 5.3 mEq/L Final     Chloride   Date/Time Value Ref Range Status   01/23/2022 09:45 AM 107 99 - 111 mEq/L Final   08/16/2018 07:34 AM 107 98 - 110 mmol/L Final     CO2   Date/Time Value Ref Range Status   01/23/2022 09:45 AM 31 (H) 17 - 29 mEq/L Final     Glucose   Date/Time Value Ref Range Status   01/23/2022 09:45 AM 87 70 - 100 mg/dL Final     Comment:     ADA guidelines for diabetes mellitus:  Fasting:  Equal to or greater than 126 mg/dL  Random:   Equal to or greater than 200 mg/dL       BUN   Date/Time Value Ref Range Status   01/23/2022 09:45 AM 11.0 7.0 - 21.0 mg/dL Final     Protein, Total   Date/Time Value Ref Range Status   01/23/2022 09:45 AM 6.8 6.0 - 8.3 g/dL Final   16/08/9603 54:09 AM 6.5 6.1 - 8.1 g/dL Final     Alkaline Phosphatase   Date/Time Value Ref Range Status   01/23/2022 09:45 AM 109 37 - 117 U/L Final     AST (SGOT)   Date/Time Value Ref Range Status   01/23/2022 09:45 AM 18 5 - 41 U/L Final     ALT   Date/Time Value Ref Range Status   01/23/2022 09:45 AM 14 0 - 55 U/L Final     Anion Gap   Date/Time Value Ref Range Status   01/23/2022 09:45 AM 4.0 (L) 5.0 - 15.0 Final     Comment:     Calculated AGAP = Na - (CL + CO2)  Interpret with caution; calculated AGAP may not  reflect patient's true clinical status.  This is a calculated value and platform-dependent.  A value >12.0 has been recommended for the management of  Hyperglycemic Crises: Diabetic Ketoacidosis and Hyperglycemic  Hyperosmolar State. Med Clin [place]. 2017;101(3):587-606.  doi:10.1016/j.mcna.2016.12.011  CBC:   WBC   Date/Time Value Ref Range Status   01/23/2022 09:45 AM 4.03 3.10 - 9.50 x10 3/uL Final   06/30/2009 04:03 PM 9.12 3.50 - 10.80 /CUMM Final     RBC   Date/Time Value Ref Range Status   01/23/2022 09:45 AM 5.10 3.90 - 5.10 x10 6/uL Final     Hemoglobin   Date/Time Value Ref Range Status   08/16/2018 07:34 AM 12.3 11.7 -  15.5 g/dL Final     Hgb   Date/Time Value Ref Range Status   01/23/2022 09:45 AM 14.6 11.4 - 14.8 g/dL Final     Hematocrit   Date/Time Value Ref Range Status   01/23/2022 09:45 AM 46.4 (H) 34.7 - 43.7 % Final     MCV   Date/Time Value Ref Range Status   01/23/2022 09:45 AM 91.0 78.0 - 96.0 fL Final     MCHC   Date/Time Value Ref Range Status   01/23/2022 09:45 AM 31.5 31.5 - 35.8 g/dL Final     RDW   Date/Time Value Ref Range Status   01/23/2022 09:45 AM 14 11 - 15 % Final     Platelets   Date/Time Value Ref Range Status   01/23/2022 09:45 AM 307 142 - 346 x10 3/uL Final   08/16/2018 07:34 AM 252 140 - 400 Thousand/uL Final           Impression / plan    Hypertension: Intolerant to many medications since gastric bypass surgery.  Continue atenolol 50 mg twice a day and losartan 100 mg daily.  She did not like the chlorthalidone that was recently prescribed.  We will start on low-dose hydrochlorothiazide 12.5 mg daily.    Follow-up with cardiology in 3 months      Nelda Bucks, MD  06/16/2022

## 2022-06-17 ENCOUNTER — Telehealth (HOSPITAL_COMMUNITY): Payer: Self-pay | Admitting: Gastroenterology

## 2022-06-17 DIAGNOSIS — K863 Pseudocyst of pancreas: Secondary | ICD-10-CM

## 2022-06-17 NOTE — Telephone Encounter (Signed)
RETURN CALL: Voicemail - Detailed Message      SUBJECT:  Appointment Request     REASON FOR VISIT: Yearly check   PREFERRED DATE/TIME: Next available   REASON UNABLE TO APPOINT: N/A

## 2022-07-10 ENCOUNTER — Encounter (INDEPENDENT_AMBULATORY_CARE_PROVIDER_SITE_OTHER): Payer: Self-pay

## 2022-07-11 ENCOUNTER — Encounter (INDEPENDENT_AMBULATORY_CARE_PROVIDER_SITE_OTHER): Payer: Self-pay

## 2022-07-17 ENCOUNTER — Encounter (HOSPITAL_BASED_OUTPATIENT_CLINIC_OR_DEPARTMENT_OTHER): Payer: Self-pay | Admitting: Registered"

## 2022-07-17 LAB — COMPREHENSIVE METABOLIC PANEL
ALT: 12 U/L (ref 6–29)
AST (SGOT): 14 U/L (ref 10–35)
Albumin/Globulin Ratio: 1.5 (calc) (ref 1.0–2.5)
Albumin: 3.9 g/dL (ref 3.6–5.1)
Alkaline Phosphatase: 89 U/L (ref 37–153)
BUN: 11 mg/dL (ref 7–25)
Bilirubin, Total: 0.4 mg/dL (ref 0.2–1.2)
CO2: 30 mmol/L (ref 20–32)
Calcium: 8.9 mg/dL (ref 8.6–10.4)
Chloride: 104 mmol/L (ref 98–110)
Creatinine: 0.55 mg/dL (ref 0.50–1.05)
Globulin: 2.6 g/dL (ref 1.9–3.7)
Glucose: 90 mg/dL (ref 65–99)
Potassium: 4.1 mmol/L (ref 3.5–5.3)
Protein, Total: 6.5 g/dL (ref 6.1–8.1)
Sodium: 142 mmol/L (ref 135–146)
eGFR: 99 mL/min/{1.73_m2} (ref >=60)

## 2022-07-17 LAB — CBC
Hematocrit: 42.5 % (ref 35.0–45.0)
Hemoglobin: 13.6 g/dL (ref 11.7–15.5)
MCH: 30.4 pg (ref 27.0–33.0)
MCHC: 32 g/dL (ref 32.0–36.0)
MCV: 94.9 fL (ref 80.0–100.0)
MPV: 10 fL (ref 7.5–12.5)
Platelets: 217 10*3/uL (ref 140–400)
RBC: 4.48 10*6/uL (ref 3.80–5.10)
RDW: 13.6 % (ref 11.0–15.0)
WBC: 4.1 10*3/uL (ref 3.8–10.8)

## 2022-07-17 LAB — VITAMIN A: Vitamin A: 48 ug/dL (ref 38–98)

## 2022-07-17 LAB — LIPID PANEL
Cholesterol / HDL Ratio: 2.4 (calc) (ref <5.0)
Cholesterol: 209 mg/dL — ABNORMAL HIGH (ref <200)
HDL: 88 mg/dL (ref >=50)
LDL Calculated: 105 mg/dL — ABNORMAL HIGH
NON HDL CHOLESTEROL: 121 mg/dL (ref <130)
Triglycerides: 74 mg/dL (ref <150)

## 2022-07-17 LAB — PTH, INTACT: PARATHYROID HORMONE INTACT: 99 pg/mL — ABNORMAL HIGH (ref 16–77)

## 2022-07-17 LAB — VITAMIN B12: Vitamin B-12: 865 pg/mL (ref 200–1100)

## 2022-07-17 LAB — IRON PROFILE
Iron Saturation: 35 % (ref 16–45)
Iron: 103 ug/dL (ref 45–160)
TIBC: 298 ug/dL (ref 250–450)

## 2022-07-17 LAB — ZINC: Zinc: 59 ug/dL — ABNORMAL LOW (ref 60–130)

## 2022-07-17 LAB — COPPER: Copper: 106 ug/dL (ref 70–175)

## 2022-07-17 LAB — WHOLE BLOOD VITAMIN B1 (THIAMINE): Thiamin (Vitamin B1), WB: 112 nmol/L (ref 78–185)

## 2022-07-17 LAB — FOLATE: Folate: 16 ng/mL

## 2022-07-17 LAB — VITAMIN D, 25 OH, TOTAL: Vitamin D, 25 OH, Total: 17 ng/mL — ABNORMAL LOW (ref 30–100)

## 2022-07-19 ENCOUNTER — Ambulatory Visit
Admission: RE | Admit: 2022-07-19 | Discharge: 2022-07-19 | Disposition: A | Discharge status: Home / Self Care | Payer: No Typology Code available for payment source | Attending: Diagnostic Radiology | Admitting: Diagnostic Radiology

## 2022-07-19 DIAGNOSIS — K863 Pseudocyst of pancreas: Secondary | ICD-10-CM | POA: Insufficient documentation

## 2022-07-19 DIAGNOSIS — K862 Cyst of pancreas: Secondary | ICD-10-CM | POA: Insufficient documentation

## 2022-07-19 MED ORDER — GADOTERIDOL 279.3 MG/ML IV SOLN
15.0000 mL | Freq: Once | INTRAVENOUS | Status: AC | PRN
Start: 2022-07-19 — End: 2022-07-19
  Administered 2022-07-19: 7.5 mmol via INTRAVENOUS

## 2022-07-23 ENCOUNTER — Encounter (HOSPITAL_BASED_OUTPATIENT_CLINIC_OR_DEPARTMENT_OTHER): Payer: Self-pay | Admitting: Gastroenterology

## 2022-07-23 ENCOUNTER — Encounter (INDEPENDENT_AMBULATORY_CARE_PROVIDER_SITE_OTHER): Payer: Self-pay | Admitting: Surgery

## 2022-07-23 ENCOUNTER — Ambulatory Visit (INDEPENDENT_AMBULATORY_CARE_PROVIDER_SITE_OTHER): Payer: Medicare Other | Admitting: Surgery

## 2022-07-23 VITALS — BP 161/72 | HR 55 | Ht 61.0 in | Wt 154.0 lb

## 2022-07-23 DIAGNOSIS — E559 Vitamin D deficiency, unspecified: Secondary | ICD-10-CM

## 2022-07-23 DIAGNOSIS — E60 Dietary zinc deficiency: Secondary | ICD-10-CM

## 2022-07-23 DIAGNOSIS — Z9884 Bariatric surgery status: Secondary | ICD-10-CM

## 2022-07-23 DIAGNOSIS — K9089 Other intestinal malabsorption: Secondary | ICD-10-CM

## 2022-07-23 NOTE — Progress Notes (Signed)
Assessment:  Status Post Robotic/laparoscopic RNY-Gastric Bypass  Vitamin D deficiency  Zinc deficiency  Elevated PTH secondary to vitamin D deficiency      Plan:  1.  Patient is to continue with portion control  2.  Patient is to increase exercise.  3.  Patient is to continue with vitamin D, vitamins, and zinc supplement  4.  Patient is to follow up with PCP as needed.  5.  Patient is to follow up in 6 month(s) with labs          Subjective:  [name] returns for follow up 7 year(s) post robotic/laparoscopic RNY-Gastric Bypass. She is doing well with 64 lbs. total weight loss. She is tolerating a regular diet and denies any nausea, vomiting, reflux or abdominal pain.  She is not compliant vitamin supplementation. Her energy level is good. She is not exercising regularly due to complications of her knee replacement.  Objective:  Blood pressure 161/72, pulse (!) 55, height 5\' 1" , weight 154 lb.     The following portions of the patient's history were reviewed and updated as appropriate: allergies, current medications, past family history, past medical history, past social history, past surgical history, and problem list.    General appearance: alert, appears stated age, and cooperative      Office Visit on 06/16/2022   Component Date Value Ref Range Status    Ventricular Rate 06/16/2022 63  BPM Final    Atrial Rate 06/16/2022 63  BPM Final    P-R Interval 06/16/2022 144  ms Final    QRS Duration 06/16/2022 84  ms Final    Q-T Interval 06/16/2022 412  ms Final    QTC Calculation (Bezet) 06/16/2022 421  ms Final    P Axis 06/16/2022 18  degrees Final    R Axis 06/16/2022 0  degrees Final    T Axis 06/16/2022 20  degrees Final    IHS MUSE NARRATIVE AND IMPRESSION 06/16/2022    Final                    Value:NORMAL SINUS RHYTHM  LEFT VENTRICULAR HYPERTROPHY    Confirmed by Katrinka Blazing MD, Beckey Rutter ([id number]) on 06/16/2022 1:02:29 PM         No results found..         No results found.      This note was  generated by the [facility] EMR system/Dragon speech recognition and may contain inherent errors or omissions not intended by the user. Grammatical errors, random word insertions, deletions, pronoun errors and incomplete sentences are occasional consequences of this technology due to software limitations. Not all errors are caught or corrected. If there are questions or concerns about the content of this note or information contained within the body of this dictation they should be addressed directly with the author for clarification.    Amber [name] R. Lavada Langsam, DO, FACS, FASMBS, FACOS

## 2022-07-30 ENCOUNTER — Other Ambulatory Visit (INDEPENDENT_AMBULATORY_CARE_PROVIDER_SITE_OTHER): Payer: Self-pay | Admitting: Internal Medicine

## 2022-07-30 DIAGNOSIS — Z1231 Encounter for screening mammogram for malignant neoplasm of breast: Secondary | ICD-10-CM

## 2022-07-30 NOTE — Progress Notes (Signed)
Mammogram is normal    Rec:  1. Continue routine annual screening

## 2022-08-02 ENCOUNTER — Encounter (INDEPENDENT_AMBULATORY_CARE_PROVIDER_SITE_OTHER): Payer: Self-pay | Admitting: Surgery

## 2022-08-02 DIAGNOSIS — Z9884 Bariatric surgery status: Secondary | ICD-10-CM

## 2022-08-02 DIAGNOSIS — E559 Vitamin D deficiency, unspecified: Secondary | ICD-10-CM

## 2022-08-07 ENCOUNTER — Encounter (HOSPITAL_BASED_OUTPATIENT_CLINIC_OR_DEPARTMENT_OTHER): Payer: Self-pay

## 2022-08-09 ENCOUNTER — Encounter (INDEPENDENT_AMBULATORY_CARE_PROVIDER_SITE_OTHER): Payer: Self-pay

## 2022-08-10 ENCOUNTER — Encounter (INDEPENDENT_AMBULATORY_CARE_PROVIDER_SITE_OTHER): Payer: Self-pay

## 2022-08-19 ENCOUNTER — Other Ambulatory Visit (INDEPENDENT_AMBULATORY_CARE_PROVIDER_SITE_OTHER): Payer: Self-pay | Admitting: Internal Medicine

## 2022-08-19 DIAGNOSIS — K219 Gastro-esophageal reflux disease without esophagitis: Secondary | ICD-10-CM

## 2022-08-21 ENCOUNTER — Other Ambulatory Visit (INDEPENDENT_AMBULATORY_CARE_PROVIDER_SITE_OTHER): Payer: Self-pay | Admitting: Internal Medicine

## 2022-08-21 DIAGNOSIS — K219 Gastro-esophageal reflux disease without esophagitis: Secondary | ICD-10-CM

## 2022-08-28 ENCOUNTER — Ambulatory Visit
Admission: RE | Admit: 2022-08-28 | Discharge: 2022-08-28 | Disposition: A | Discharge status: Home / Self Care | Payer: No Typology Code available for payment source | Attending: Body Imaging | Admitting: Body Imaging

## 2022-08-28 DIAGNOSIS — Z1509 Genetic susceptibility to other malignant neoplasm: Secondary | ICD-10-CM | POA: Insufficient documentation

## 2022-08-28 DIAGNOSIS — C50912 Malignant neoplasm of unspecified site of left female breast: Secondary | ICD-10-CM | POA: Insufficient documentation

## 2022-08-28 DIAGNOSIS — Z1501 Genetic susceptibility to malignant neoplasm of breast: Secondary | ICD-10-CM | POA: Insufficient documentation

## 2022-08-28 DIAGNOSIS — C50911 Malignant neoplasm of unspecified site of right female breast: Secondary | ICD-10-CM | POA: Insufficient documentation

## 2022-08-28 MED ORDER — GADOTERIDOL 279.3 MG/ML IV SOLN
20.0000 mL | Freq: Once | INTRAVENOUS | Status: AC | PRN
Start: 2022-08-28 — End: 2022-08-28
  Administered 2022-08-28: 10 mmol via INTRAVENOUS

## 2022-09-03 NOTE — Progress Notes (Signed)
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 Patient received 6 cycles of CMF chemotherapy followed by radiation therapy.      2. BRCA1 mutation positive. S/p bilateral salpingo-oophorectomy. She has been followed by GI with endoscopies and pancreatic MRI.    Patient clinically is doing well.   She is also on high risk breast cancer surveillance with yearly screening mammogram alternating with yearly bilateral screening MRI both at [facility]    Last breast MRI on 08/28/2022: Benign finding   Mammogram in April 2023 was stable and benign.     Continue with high risk screening with breast MRI and mammogram annually alternating. Due for a repeat mammogram in March 2023 (orders)    Pancreatic cancer surveillance screening. family history. Brca1 +.  - Patient follows GI  - 07/19/2022 MRI: Pancreatic head cystic lesion, 4 mm, essentially unchanged from 2014. No suspicious features.     RTC in 6 months, sooner if needed     ONCOLOGIC HISTORY   I have reviewed pertinent oncologic records, available images and pathology.  Summary of oncologic history is below.     Patient has been followed by Dr. Lissa Merlin at [facility] but she just retired. Oncology history as above but briefly in 2001 patient had right breast carcinoma, node positive and she underwent lumpectomy followed by chemotherapy with AC plus T followed by radiation therapy and almost 5 years later  she developed stage I left breast carcinoma also triple negative and she underwent chemotherapy with CMF followed by radiation therapy. Unfortunately I do not have all the details about her cancers including grade. Looks like initially patient was tested negative for the genetic mutation but then she was referred to [place] and looks like she turned out to be BRCA1 positive. She has extensive history of cancer in her family as above including father with prostate, mother with pancreatic, sister with pancreatic and breast and another sister with breast cancer x2. Looks like her brother who does not have cancer just test positive. Patient also developed left breast carcinoma in 2006 and it was a stage I and she underwent 6 cycles of CMF chemotherapy followed by radiation therapy. Also in 2006 she underwent bilateral salpingo-oophorectomy and for the last few years she has been following with GI in [place] and having EGD down to the pancreas. She also had MRI of the pancreas in September 2021 that was unremarkable. Overall patient is doing well and she denies any problems specifically she denies any issues with her breasts, she has no abdominal pain or any symptoms.    CURRENT TREATMENT  Surveillance    INTERVAL HISTORY      Doing quite well.  She reports no new symptoms or concerns.   MRI Breast Oct 2023: benign  MRI Pancreas in September 2023: stable simple cyst.  She will be seeing Dr. Tye Savoy at [facility] for pancreatic surveillance, alternates endoscopies and MRIs. Had colonoscopy in June 2023.  ? No  records.   works full-time for the [employer].  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 Farinae)] Unknown     Allergy test came back positive for dust, reaction is unknown.         MEDICATIONS      Current Outpatient Medications:     amLODIPine 5 MG tablet, Take 5 mg by mouth daily., Disp: , Rfl:     atorvastatin 20 MG tablet, Take 20 mg by mouth at bedtime., Disp: , Rfl:     cholecalciferol 50 mcg (2,000 unit) capsule, Take 2,000 units by mouth daily., Disp: , Rfl:     Coenzyme Q10 (COQ-10 OR), Take 1 tablet by mouth as needed., Disp: , Rfl:     Cyanocobalamin (VITAMIN B-12 OR), Take 1 tablet by mouth as needed., Disp: , Rfl:     mometasone 50 MCG/ACT nasal spray, Spray 2 sprays in the nostril as needed., Disp: , Rfl:     Multiple Vitamin (Multi-Vitamin) tablet, Take 1 tablet by mouth daily., Disp: , Rfl:     Omega-3 Fatty Acids (OMEGA 3 OR), Take 1 tablet by mouth as needed., Disp: , Rfl:     OMEPRAZOLE OR, Take 1 tablet by mouth as needed., Disp: , Rfl:     polyethylene glycol 3350 with electrolytes (Golytely) 236 g solution, Take per instructions already provided to patient for bowel prep., Disp: 4000 mL, Rfl: 0      REVIEW OF SYSTEMS  A full ROS was obtained as part of the intake form, which has been reviewed and signed.   As per Interval History above, a complete ROS is otherwise negative.      PHYSICAL EXAM    BP (!) 153/90   Pulse (!) 103   Temp 36.8 C (Oral)   Resp 15   Wt 78.1 kg (172 lb 3.2 oz)   SpO2 96%   BMI 31.50 kg/m     ECOG: 0  General: Pleasant, well-appearing. No acute distress.  HEENT: Sclerae anicteric.   Lungs: Breathing  unlabored.  Breast: Well-healed the scars over both breasts.  No palpable masses.  No nipple changes and no palpable bilateral axillary nodes.  Abdomen: Bowel sounds present. Soft, nontender, nondistended.   Neuro: Alert and oriented x4. No focal deficits.  Skin: Without rash.    LABORATORY RESULTS    Results for orders placed or performed in visit on 02/23/22  CBC with Differential   Result Value Ref Range    WBC 6.59 4.3 - 10.0 10*3/uL    RBC 4.60 3.80 - 5.00 10*6/uL    Hemoglobin 12.3 11.5 - 15.5 g/dL    Hematocrit 39 36.0 - 45.0 %    MCV 84 81 - 98 fL    MCH 26.7 (L) 27.3 - 33.6 pg    MCHC 31.9 (L) 32.2 - 36.5 g/dL    Platelet Count 393 150 - 400 10*3/uL    RDW-CV 14.5 11.0 - 14.5 %    % Neutrophils 42 %    % Lymphocytes 42 %    % Monocytes 12 %    % Eosinophils 3 %    % Basophils 1 %    % Immature Granulocytes 0 %    Neutrophils 2.76 1.80 - 7.00 10*3/uL    Absolute Lymphocyte Count 2.74 1.00 - 4.80 10*3/uL    Monocytes 0.82 (H) 0.00 - 0.80 10*3/uL    Absolute Eosinophil Count 0.21 0.00 - 0.50 10*3/uL    Basophils 0.05 0.00 - 0.20 10*3/uL    Immature Granulocytes 0.01 0.00 - 0.05 10*3/uL    Nucleated RBC 0.00 0.00 10*3/uL    % Nucleated RBC 0 %   Comprehensive Metabolic Panel   Result Value Ref Range    Sodium 136 135 - 145 meq/L    Potassium 3.9 3.6 - 5.2 meq/L    Chloride 104 98 - 108 meq/L    Carbon Dioxide, Total 26 22 - 32 meq/L    Anion Gap 6 4 - 12    Glucose 92 62 - 125 mg/dL    Urea Nitrogen 17 8 - 21 mg/dL    Creatinine 0.76 0.38 - 1.02 mg/dL    Protein (Total) 7.7 6.0 - 8.2 g/dL    Albumin 4.4 3.5 - 5.2 g/dL    Bilirubin (Total) 0.5 0.2 - 1.3 mg/dL    Calcium 9.4 8.9 - 10.2 mg/dL    AST (GOT) 23 9 - 38 U/L    Alkaline Phosphatase (Total) 106 38 - 172 U/L    ALT (GPT) 18 7 - 33 U/L    eGFR by CKD-EPI 2021 >60 >59 mL/min/1.73_m2           Time Statement: I spent a total of 30 minutes for the patient's care on the date of the service which includes chart, data review and analysis, consultation, care  coordination, and charting.     This note was completed at least in part with voice recognition software.

## 2022-09-04 ENCOUNTER — Ambulatory Visit: Payer: No Typology Code available for payment source | Attending: Internal Medicine | Admitting: Internal Medicine

## 2022-09-04 ENCOUNTER — Encounter (HOSPITAL_BASED_OUTPATIENT_CLINIC_OR_DEPARTMENT_OTHER): Payer: Self-pay | Admitting: Registered"

## 2022-09-04 DIAGNOSIS — Z1501 Genetic susceptibility to malignant neoplasm of breast: Secondary | ICD-10-CM | POA: Insufficient documentation

## 2022-09-04 DIAGNOSIS — C50911 Malignant neoplasm of unspecified site of right female breast: Secondary | ICD-10-CM | POA: Insufficient documentation

## 2022-09-04 DIAGNOSIS — Z1509 Genetic susceptibility to other malignant neoplasm: Secondary | ICD-10-CM | POA: Insufficient documentation

## 2022-09-04 DIAGNOSIS — C50912 Malignant neoplasm of unspecified site of left female breast: Secondary | ICD-10-CM | POA: Insufficient documentation

## 2022-09-09 ENCOUNTER — Encounter (INDEPENDENT_AMBULATORY_CARE_PROVIDER_SITE_OTHER): Payer: Self-pay

## 2022-09-10 ENCOUNTER — Encounter (INDEPENDENT_AMBULATORY_CARE_PROVIDER_SITE_OTHER): Payer: Self-pay | Admitting: Cardiology

## 2022-09-10 ENCOUNTER — Encounter (INDEPENDENT_AMBULATORY_CARE_PROVIDER_SITE_OTHER): Payer: Self-pay

## 2022-09-10 ENCOUNTER — Ambulatory Visit (INDEPENDENT_AMBULATORY_CARE_PROVIDER_SITE_OTHER): Payer: Medicare Other | Admitting: Cardiology

## 2022-09-10 VITALS — BP 173/83 | HR 64 | Temp 97.8°F | Ht 61.0 in | Wt 157.0 lb

## 2022-09-10 DIAGNOSIS — I10 Essential (primary) hypertension: Secondary | ICD-10-CM

## 2022-09-10 MED ORDER — AMLODIPINE BESYLATE 5 MG PO TABS
5.0000 mg | ORAL_TABLET | Freq: Every day | ORAL | 3 refills | Status: DC
Start: 2022-09-10 — End: 2022-11-25

## 2022-09-10 MED ORDER — BENAZEPRIL HCL 40 MG PO TABS
40.0000 mg | ORAL_TABLET | Freq: Every day | ORAL | 3 refills | Status: DC
Start: 2022-09-10 — End: 2022-11-25

## 2022-09-10 NOTE — Progress Notes (Signed)
Amber [name] 71 y.o. with a history of HTN presents for cardiac follow up of hypertension.     Since her gastric surgery, she has been intolerant to different medications due to GI distress.       Hypertension: Multiple medication intolerances since bariatric surgery.  Currently she is on losartan 100 mg daily and atenolol 50 mg bid.  She was intolerant to the recently prescribed hydrochlorothiazide, nifedipine ( coughing).   Blood pressure control has still mildly uncontrolled. She also complains of significant nausea when taking her losartan.  She would like to change back to amlodipine.  Of note she did have nausea with amlodipine in the past.  She would also like to try benazepril.  She had nausea with lisinopril in the past     Cerebrovascular disease: History of a TIA.  She has had no recurrence.  This TIA occurred when she was not on aspirin.  Currently compliant with aspirin.      Cardiac work up has included     Echocardiogram (04/04/2020): Normal LV/RV size and systolic function.  Ejection fraction 60%.  Mild left ventricular hypertrophy.  No significant valvular heart disease.  No pericardial effusion.                    - stress test (11/06/17 - normal myocardial perfusion)               - cardiac cath ( 01/08/14 - normal )              - echocardiogram ( EF 55%, mild TR)        Current Outpatient Medications   Medication Sig Dispense Refill    aspirin EC 81 MG EC tablet Take 1 tablet (81 mg total) by mouth daily. 30 tablet 1    Cyanocobalamin (B-12 IJ) Inject as directed every 30 (thirty) days         diazePAM (VALIUM) 5 MG tablet Take 1 tablet (5 mg total) by mouth every 8 (eight) hours as needed for Anxiety 30 tablet 2    famotidine (PEPCID) 20 MG tablet Take 1 tablet (20 mg total) by mouth daily (Patient taking differently: Take 1 tablet (20 mg) by mouth as needed) 90 tablet 1    fluticasone (FLONASE) 50 MCG/ACT nasal spray 2 sprays by Nasal route daily. (Patient taking differently: 2 sprays by Nasal  route as needed) 16 g 2    gabapentin (NEURONTIN) 300 MG capsule daily as needed      Multiple Vitamin (MULTIVITAMIN) capsule Take 1 capsule by mouth daily      ondansetron (ZOFRAN-ODT) 4 MG disintegrating tablet Take 1 tablet (4 mg total) by mouth every 8 (eight) hours as needed for Nausea 30 tablet 2    pantoprazole (PROTONIX) 40 MG tablet TAKE 1 TABLET(40 MG) BY MOUTH DAILY 90 tablet 0    valACYclovir HCL (VALTREX) 500 MG tablet TAKE 1 TABLET(500 MG) BY MOUTH DAILY 90 tablet 1    vitamin D, ergocalciferol, (DRISDOL) 50000 UNIT Cap Take 1 capsule (50,000 Units) by mouth once a week 12 capsule 1    amLODIPine (NORVASC) 5 MG tablet Take 1 tablet (5 mg) by mouth daily 90 tablet 3    benazepril (LOTENSIN) 40 MG tablet Take 1 tablet (40 mg) by mouth daily 90 tablet 3     No current facility-administered medications for this visit.            PE:    Vitals:  09/10/22 0804   BP: 173/83   Pulse: 64   Temp:    SpO2:      Body mass index is 29.66 kg/m.    Physical Examination: General appearance - alert, well appearing, and in no distress  Mental status - alert, oriented to person, place, and time  Chest - clear to auscultation, no wheezes, rales or rhonchi, symmetric air entry  Heart - normal rate and regular rhythm, S1 and S2 normal  Abdomen - soft, nontender, nondistended, no masses or organomegaly  Extremities - no pedal edema noted        Labs:  Lipid Panel   Cholesterol   Date/Time Value Ref Range Status   07/11/2022 07:24 AM 209 (H) <200 mg/dL Final     Triglycerides   Date/Time Value Ref Range Status   07/11/2022 07:24 AM 74 <150 mg/dL Final   16/10/960403/24/2023 54:[id number]:45 AM 39 34 - 149 mg/dL Final     HDL   Date/Time Value Ref Range Status   07/11/2022 07:24 AM 88 > OR = 50 mg/dL Final       CMP:   Sodium   Date/Time Value Ref Range Status   07/11/2022 07:24 AM 142 135 - 146 mmol/L Final     Potassium   Date/Time Value Ref Range Status   07/11/2022 07:24 AM 4.1 3.5 - 5.3 mmol/L Final     Chloride   Date/Time Value Ref Range  Status   07/11/2022 07:24 AM 104 98 - 110 mmol/L Final     CO2   Date/Time Value Ref Range Status   07/11/2022 07:24 AM 30 20 - 32 mmol/L Final     Glucose   Date/Time Value Ref Range Status   07/11/2022 07:24 AM 90 65 - 99 mg/dL Final     Comment:                   Fasting reference interval          BUN   Date/Time Value Ref Range Status   07/11/2022 07:24 AM 11 7 - 25 mg/dL Final     Protein, Total   Date/Time Value Ref Range Status   07/11/2022 07:24 AM 6.5 6.1 - 8.1 g/dL Final     Alkaline Phosphatase   Date/Time Value Ref Range Status   07/11/2022 07:24 AM 89 37 - 153 U/L Final     AST (SGOT)   Date/Time Value Ref Range Status   07/11/2022 07:24 AM 14 10 - 35 U/L Final     ALT   Date/Time Value Ref Range Status   07/11/2022 07:24 AM 12 6 - 29 U/L Final     Anion Gap   Date/Time Value Ref Range Status   01/23/2022 09:45 AM 4.0 (L) 5.0 - 15.0 Final     Comment:     Calculated AGAP = Na - (CL + CO2)  Interpret with caution; calculated AGAP may not  reflect patient's true clinical status.  This is a calculated value and platform-dependent.  A value >12.0 has been recommended for the management of  Hyperglycemic Crises: Diabetic Ketoacidosis and Hyperglycemic  Hyperosmolar State. Med Clin [place]. 2017;101(3):587-606.  doi:10.1016/j.mcna.2016.12.011         CBC:   WBC   Date/Time Value Ref Range Status   07/11/2022 07:24 AM 4.1 3.8 - 10.8 Thousand/uL Final   06/30/2009 04:03 PM 9.12 3.50 - 10.80 /CUMM Final     RBC   Date/Time Value Ref Range Status  07/11/2022 07:24 AM 4.48 3.80 - 5.10 Million/uL Final     Hemoglobin   Date/Time Value Ref Range Status   07/11/2022 07:24 AM 13.6 11.7 - 15.5 g/dL Final     Hematocrit   Date/Time Value Ref Range Status   07/11/2022 07:24 AM 42.5 35.0 - 45.0 % Final     MCV   Date/Time Value Ref Range Status   07/11/2022 07:24 AM 94.9 80.0 - 100.0 fL Final     MCHC   Date/Time Value Ref Range Status   07/11/2022 07:24 AM 32.0 32.0 - 36.0 g/dL Final     RDW   Date/Time Value Ref  Range Status   07/11/2022 07:24 AM 13.6 11.0 - 15.0 % Final     Platelets   Date/Time Value Ref Range Status   07/11/2022 07:24 AM 217 140 - 400 Thousand/uL Final   08/16/2018 07:34 AM 252 140 - 400 Thousand/uL Final           Impression / plan    Hypertension: Intolerant to many medications since gastric bypass surgery.  Patient would like to switch regimen again.  We will discontinue losartan, hydrochlorothiazide, atenolol.  We will start her on amlodipine 5 mg daily and benazepril 40 mg daily.  Also discussed with her the option of clonidine patch given her recurrent GI distress.    Follow-up with cardiology in 3 months      Nelda Bucks, MD  09/10/2022

## 2022-09-11 ENCOUNTER — Ambulatory Visit (INDEPENDENT_AMBULATORY_CARE_PROVIDER_SITE_OTHER): Payer: Medicare Other | Admitting: Internal Medicine

## 2022-09-30 ENCOUNTER — Encounter (INDEPENDENT_AMBULATORY_CARE_PROVIDER_SITE_OTHER): Payer: Self-pay | Admitting: Internal Medicine

## 2022-10-09 ENCOUNTER — Encounter (INDEPENDENT_AMBULATORY_CARE_PROVIDER_SITE_OTHER): Payer: Self-pay

## 2022-10-10 ENCOUNTER — Encounter (INDEPENDENT_AMBULATORY_CARE_PROVIDER_SITE_OTHER): Payer: Self-pay

## 2022-11-03 ENCOUNTER — Telehealth (INDEPENDENT_AMBULATORY_CARE_PROVIDER_SITE_OTHER): Payer: Self-pay

## 2022-11-03 NOTE — Telephone Encounter (Signed)
I spoke with pt who reports side effects from both Amlodipine and Benazepril since November. She states it makes her stomach issues worse with nausea and vomiting, palpitations, rib / chest discomfort, hours after taking the medications.   Most recent vitals BP 155/86 HR 65.

## 2022-11-05 NOTE — Telephone Encounter (Signed)
Dr. Tamala Julian called pt directly and left a voicemail.

## 2022-11-06 ENCOUNTER — Telehealth (INDEPENDENT_AMBULATORY_CARE_PROVIDER_SITE_OTHER): Payer: Self-pay | Admitting: Cardiology

## 2022-11-06 NOTE — Telephone Encounter (Signed)
Patient missed Dr. Thompson Caul call and returning the call.        Call back number is [place]  [facility]

## 2022-11-06 NOTE — Telephone Encounter (Signed)
Secure chat sent to Dr. Tamala Julian. He would like to speak directly to pt.

## 2022-11-09 ENCOUNTER — Encounter (INDEPENDENT_AMBULATORY_CARE_PROVIDER_SITE_OTHER): Payer: Self-pay

## 2022-11-10 ENCOUNTER — Encounter (INDEPENDENT_AMBULATORY_CARE_PROVIDER_SITE_OTHER): Payer: Self-pay

## 2022-11-20 ENCOUNTER — Ambulatory Visit
Admission: RE | Admit: 2022-11-20 | Discharge: 2022-11-20 | Disposition: A | Discharge status: Home / Self Care | Payer: No Typology Code available for payment source | Attending: Diagnostic Radiology | Admitting: Diagnostic Radiology

## 2022-11-20 ENCOUNTER — Encounter (HOSPITAL_BASED_OUTPATIENT_CLINIC_OR_DEPARTMENT_OTHER): Payer: Self-pay

## 2022-11-20 DIAGNOSIS — Z1231 Encounter for screening mammogram for malignant neoplasm of breast: Secondary | ICD-10-CM | POA: Insufficient documentation

## 2022-11-23 ENCOUNTER — Encounter (INDEPENDENT_AMBULATORY_CARE_PROVIDER_SITE_OTHER): Payer: Self-pay | Admitting: Cardiology

## 2022-11-25 ENCOUNTER — Other Ambulatory Visit (INDEPENDENT_AMBULATORY_CARE_PROVIDER_SITE_OTHER): Payer: Self-pay | Admitting: Cardiology

## 2022-11-25 DIAGNOSIS — I10 Essential (primary) hypertension: Secondary | ICD-10-CM

## 2022-11-25 MED ORDER — CLONIDINE 0.1 MG/24HR TD PTWK
1.0000 | MEDICATED_PATCH | TRANSDERMAL | 3 refills | Status: DC
Start: 2022-11-25 — End: 2022-12-29

## 2022-11-25 MED ORDER — ATENOLOL 25 MG PO TABS
25.0000 mg | ORAL_TABLET | Freq: Every day | ORAL | 0 refills | Status: DC
Start: 2022-11-25 — End: 2022-12-11

## 2022-11-25 NOTE — Progress Notes (Signed)
Recurrent GI distress due to bp medications      Will stop lotensin and amlodipine    Start clonidine patch 0.1 mg weekly    Start atenolol 25 mg daily    Will get echo

## 2022-12-01 ENCOUNTER — Encounter (INDEPENDENT_AMBULATORY_CARE_PROVIDER_SITE_OTHER): Payer: Self-pay | Admitting: Cardiology

## 2022-12-01 ENCOUNTER — Encounter (INDEPENDENT_AMBULATORY_CARE_PROVIDER_SITE_OTHER): Payer: Self-pay

## 2022-12-01 ENCOUNTER — Telehealth (INDEPENDENT_AMBULATORY_CARE_PROVIDER_SITE_OTHER): Payer: Self-pay | Admitting: Cardiology

## 2022-12-01 NOTE — Telephone Encounter (Signed)
Amber [name] is a 72 year old female.  She is requesting cardiac clearance prior to new manipulation.  I talked to her on the telephone within the last 2 weeks.  No complaints of chest pain or exertional dyspnea.  Her major complaint is GI intolerance with certain medications.    There are no cardiac contraindications to proceeding with orthopedic procedure/surgery.  Okay to proceed with surgery/anesthesia from a cardiac standpoint.  No further cardiac workup warranted prior to surgery.    Please note prior cardiac workup as listed below    Cardiac work up has included     Echocardiogram (04/04/2020): Normal LV/RV size and systolic function.  Ejection fraction 60%.  Mild left ventricular hypertrophy.  No significant valvular heart disease.  No pericardial effusion.                    - stress test (11/06/17 - normal myocardial perfusion)               - cardiac cath ( 01/08/14 - normal )              - echocardiogram ( EF 55%, mild TR)

## 2022-12-06 ENCOUNTER — Other Ambulatory Visit (INDEPENDENT_AMBULATORY_CARE_PROVIDER_SITE_OTHER): Payer: Self-pay | Admitting: Internal Medicine

## 2022-12-06 DIAGNOSIS — K219 Gastro-esophageal reflux disease without esophagitis: Secondary | ICD-10-CM

## 2022-12-09 ENCOUNTER — Telehealth (INDEPENDENT_AMBULATORY_CARE_PROVIDER_SITE_OTHER): Payer: Self-pay | Admitting: Internal Medicine

## 2022-12-09 ENCOUNTER — Encounter (INDEPENDENT_AMBULATORY_CARE_PROVIDER_SITE_OTHER): Payer: Self-pay | Admitting: Internal Medicine

## 2022-12-09 NOTE — Telephone Encounter (Signed)
She should continue aspirin unless she is having surgery.

## 2022-12-09 NOTE — Telephone Encounter (Signed)
Called patient and lvmm. Advised to call back if she had any further questions.

## 2022-12-09 NOTE — Telephone Encounter (Signed)
Patient is calling stating that she is scheduled to have a knee manipulation procedure done on 2.13.24 and was advised to reach out to her PCP to see if she should stop taking her aspirin EC 81 MG EC tablet in preparation for the procedure.    Please advise.     Thanks.

## 2022-12-10 ENCOUNTER — Encounter (INDEPENDENT_AMBULATORY_CARE_PROVIDER_SITE_OTHER): Payer: Self-pay

## 2022-12-10 ENCOUNTER — Encounter (INDEPENDENT_AMBULATORY_CARE_PROVIDER_SITE_OTHER): Payer: Self-pay | Admitting: Cardiology

## 2022-12-11 ENCOUNTER — Other Ambulatory Visit (INDEPENDENT_AMBULATORY_CARE_PROVIDER_SITE_OTHER): Payer: Self-pay | Admitting: Cardiology

## 2022-12-11 ENCOUNTER — Encounter (INDEPENDENT_AMBULATORY_CARE_PROVIDER_SITE_OTHER): Payer: Self-pay

## 2022-12-11 MED ORDER — ATENOLOL 50 MG PO TABS
50.0000 mg | ORAL_TABLET | Freq: Every day | ORAL | 3 refills | Status: DC
Start: 2022-12-11 — End: 2023-04-12

## 2022-12-24 NOTE — ED Provider Notes (Signed)
[facility] EMERGENCY DEPT     Time of Arrival:   12/24/22 1410       Final diagnoses:   [R00.2] Palpitations (Primary)   [R03.0] Elevated blood pressure reading   [R07.89] Atypical chest pain   [M25.562, G89.29] Chronic pain of left knee         Medical Decision Making:      Differential Diagnosis:   Palpitations DDX- atrial fibrillation, SVT, PAC's, PVC's, sinus arrhythmia, sinua tachycardia, sick sinus syndrome.    MI, ACS: Stable vs unstable angina, Atrial flutter, A-fib, Pleurisy, CHF, Chest wall pain, Reflux, Pneumonia, Biliary colic, PE, Dissection                                              ED Course/Consults: In addition see HPI.       ED Course as of 12/27/22 1039   Thu Dec 24, 2022   1630 EKG: NSR 52bpm,  nml PR/QRS intervals,  no ST/T wave abnormality, no QT prolongation, No STEMI     [CA]   1911 BP improved to 140/95.   Reevaluation:    Patient feeling better after treatment.   No significant concern  on labs or imaging to warrant further testing.   At discharge, pt looked well, nontoxic, no distress, and is good candidate for outpatient follow up. No signs of toxicity to suggests need for further labs, imaging or for admission.   Results discussed w/ patient/family.  All questions were answered.  Pt is ambulatory on d/c without difficulty. Good PO.      [CA]             Documentation/Prior Results Review:  Old medical records, Nursing notes    Imaging Interpreted by me: Not Applicable    ED CT HEAD NO CONTRAST   Final Result   IMPRESSION:   1. Periventricular white matter changes consistent with chronic   small vessel ischemia.   2. Chronic lacunar CVA right internal capsule periventricular white   matter.   3. 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   4. No acute intracranial process identified.         Electronically Signed     By: Layla Maw M.D.     On: 12/24/2022 16:51         EKG 12 LEAD UNIT PERFORMED   Final Result      CHEST PORTABLE   Final Result    IMPRESSION:   No radiographic evidence of acute cardiopulmonary process.         Electronically Signed     By: Lesia Hausen M.D.     On: 12/24/2022 16:20             Disposition:  Home    .    Discharge Medication List as of 12/24/2022  7:16 PM        START taking these medications    Details   cloNIDine (CATAPRES) 0.2 mg/24 hr TD PTWK Apply 1 Patch as directed Every 7 Days., Disp-12 Patch, R-0, Normal      traMADoL (ULTRAM) 50 mg PO TABS Take 1 Tab by Mouth Every 6 Hours As Needed for Moderate Pain (Pain Score 4-6) for up to 3 days., Disp-6 Tab, R-0, Normal           Chief Complaint   Patient presents with   .  HIGH BLOOD PRESSURE     HPI  72 year old female   has a past medical history of Acid reflux, Arthropathy, Cerebral artery occlusion with cerebral infarction (HCC), Decreased exercise tolerance, Esophageal hiatal hernia, Esophageal reflux, GERD (gastroesophageal reflux disease), Hiatal hernia, Hypertension, Sleep apnea, and TIA (transient ischemic attack).  Presents to the ED complaining of substernal chest pain and elevated blood pressure readings that she noted since this morning.  States her blood pressure has been as high as 200/1 100s.  States she has been compliant with all of her blood pressure pills.  States her primary care doctor has been having difficulty with blood pressure control for home.  Last week her atenolol 25 mg was increased to 50 mg for daily.  Denies leg swelling.  Denies fever or chills.  Denies cough or congestion.  Does report chronic left knee pain is slightly worse than usual.  States oxycodone helps slightly for pain however it makes her nauseous so she tries not to take it.    Review of Systems see HPI     Physical Exam  Vitals and nursing note reviewed.   Constitutional:       General: She is not in acute distress.     Appearance: Normal appearance. She is well-developed. She is not ill-appearing or toxic-appearing.   HENT:      Head: Normocephalic and atraumatic.      Nose:  Nose normal.      Mouth/Throat:      Mouth: Mucous membranes are moist.   Eyes:      Conjunctiva/sclera: Conjunctivae normal.      Pupils: Pupils are equal, round, and reactive to light.   Cardiovascular:      Rate and Rhythm: Normal rate and regular rhythm.      Pulses: Normal pulses.      Heart sounds: Normal heart sounds.   Pulmonary:      Effort: Pulmonary effort is normal. No respiratory distress.      Breath sounds: Normal breath sounds. No stridor. No wheezing.   Abdominal:      General: Bowel sounds are normal. There is no distension.      Palpations: Abdomen is soft. There is no mass.      Tenderness: There is no abdominal tenderness. There is no guarding or rebound.   Musculoskeletal:         General: Normal range of motion.      Cervical back: Normal range of motion and neck supple.   Skin:     General: Skin is warm.      Capillary Refill: Capillary refill takes less than 2 seconds.   Neurological:      General: No focal deficit present.      Mental Status: She is alert and oriented to person, place, and time.      Comments: Normal sensation. No facial asymmetry. GCS 15,EOMI, PERRLA, strength/sensation 5/5 all extremities, CN II-XII intact      Psychiatric:         Mood and Affect: Mood normal.         Behavior: Behavior normal.         Past Medical History:   Diagnosis Date   . Acid reflux    . Arthropathy     KNEES   . Cerebral artery occlusion with cerebral infarction (HCC)     TIA.....2 LAST YEAR   . Decreased exercise tolerance    . Esophageal hiatal hernia    . Esophageal reflux    .  GERD (gastroesophageal reflux disease)    . Hiatal hernia    . Hypertension    . Sleep apnea     uses cpap   . TIA (transient ischemic attack)      Past Surgical History:   Procedure Laterality Date   . APPENDECTOMY     . CESAREAN SECTION     . GASTRIC BYPASS  10/22/2015   . HERNIA REPAIR  2016    DEC 20   . LAP CHOLECYSTECTOMY N/A 08/13/2014    Procedure: LAPAROSCOPIC CHOLECYSTECTOMY WITH CHOLANGIOGRAPHY;  Surgeon:  Josefa Half R, DO;  Location: [facility] MAIN OR LOC;  Service: General;  Laterality: N/A;  lap chole w/ ioc   . SPINE SURGERY N/A 11/16/2013    Procedure: CERVICAL ANTERIOR DISCECTOMY/ LAMINECTOMY WITH FUSION Cervical 5 through 7;  Surgeon: Vella Raring, MD;  Location: [facility] MAIN OR LOC;  Service: Orthopedics;  Laterality: N/A;     Family History   Problem Relation Age of Onset   . Cancer Mother    . Heart Failure Father    . Diabetes Brother    . Seizures Brother    . Cancer Maternal Aunt    . Diabetes 1st Cousin    . Anesthesia Reaction Neg Hx      Social History     Occupational History   . Not on file   Tobacco Use   . Smoking status: Never   . Smokeless tobacco: Never   Vaping Use   . Vaping Use: Never used   Substance and Sexual Activity   . Alcohol use: No   . Drug use: No   . Sexual activity: Not on file     Outpatient Medications Marked as Taking for the 12/24/22 encounter [facility] Encounter)   Medication Sig Dispense Refill   . cloNIDine (CATAPRES) 0.2 mg/24 hr TD PTWK Apply 1 Patch as directed Every 7 Days. 12 Patch 0   . traMADoL (ULTRAM) 50 mg PO TABS Take 1 Tab by Mouth Every 6 Hours As Needed for Moderate Pain (Pain Score 4-6) for up to 3 days. 6 Tab 0     Allergies   Allergen Reactions   . Acyclovir gi distress   . Amoxicillin-Pot Clavulanate gi distress   . Asa Buff (Mag Carb-Al Glyc) [Aspirin, Buffered] gi distress     Upsets stomach   . Aspirin gi distress     Patient states "it upsets my stomach"   . Atorvastatin other/intolerance     Palpitation   . Lovastatin gi distress   . Metronidazole gi distress and neurological reaction   . Moxifloxacin gi distress     GI symptoms   . Statins-Hmg-Coa Reductase Inhibitors gi distress and unknown     Palpitations, chest pain, abd pain    . Sulfa (Sulfonamide Antibiotics) gi distress       Vital Signs:  Patient Vitals for the past 72 hrs:   Temp Heart Rate Resp BP BP Mean SpO2 Weight   12/24/22 1900 -- 55 18 144/60 88 MM HG 99 % --   12/24/22 1810 -- 59  18 198/81 (!) 120 MM HG -- --   12/24/22 1643 -- 56 18 188/78 (!) 115 MM HG -- --   12/24/22 1431 -- -- -- -- -- -- 65.8 kg (145 lb)   12/24/22 1430 98.3 F (36.8 C) 60 18 180/89 (!) 119 MM HG 99 % --   12/24/22 1413 -- 65 -- -- -- 98 % --  Diagnostics:  Labs:    Results for orders placed or performed during the hospital encounter of 12/24/22   Chem8, i-STAT (Lab)   Result Value Ref Range    POTASSIUM 3.8 3.5 - 5.5 mmol/L    CHLORIDE 105 98 - 110 mmol/L    CALCIUM IONIZED 4.3 (L) 4.4 - 5.4 mg/dL    CO2 16.1 09.6 - 04.5 mmol/L    Glucose 86 70 - 99 mg/dL    BUN 8 6 - 22 mg/dL    CREATININE 0.6 (L) 0.8 - 1.4 mg/dL    SODIUM 409 811 - 914 mmol/L    HGB 15.0 11.7 - 16.1 g/dL    HCT 78.2 95.6 - 21.3 %    POC-eGFR >60.0 >60.0    Cartridge type CHEM8+    HEPATIC FUNCTION PANEL   Result Value Ref Range    Albumin 4.2 3.5 - 5.0 g/dL    Total Protein 7.1 6.2 - 8.1 g/dL    Globulin 2.9 2.0 - 4.0 g/dL    A/G Ratio 1.4 1.1 - 2.6 ratio    Bilirubin Total 0.4 0.2 - 1.2 mg/dL    Bilirubin Direct <0.8 0.0 - 0.3 mg/dL    SGOT (AST) 17 10 - 37 U/L    Alkaline Phosphatase 109 40 - 120 U/L    SGPT (ALT) 10 5 - 40 U/L   LIPASE   Result Value Ref Range    Lipase <20 7 - 60 U/L   Troponin Care Path   Result Value Ref Range    Troponin (T) Quant High Sensitivity (5th Gen) 12 0 - 19 ng/L   CBC WITH DIFFERENTIAL AUTO   Result Value Ref Range    WBC 4.2 4.0 - 11.0 K/uL    RBC 4.72 3.80 - 5.20 M/uL    HGB 13.9 11.7 - 16.1 g/dL    HCT 65.7 84.6 - 96.2 %    MCV 90 80 - 99 fL    MCH 29 26 - 34 pg    MCHC 33 31 - 36 g/dL    RDW 95.2 84.1 - 32.4 %    Platelet 235 140 - 440 K/uL    MPV 9.5 9.0 - 13.0 fL    Segmented Neutrophils (Auto) 60 40 - 75 %    Lymphocytes (Auto) 27 20 - 45 %    Monocytes (Auto) 11 3 - 12 %    Eosinophils (Auto) 2 0 - 6 %    Basophils (Auto) 1 0 - 2 %    Absolute Neutrophils (Auto) 2.5 1.8 - 7.7 K/uL    Absolute Lymphocytes (Auto) 1.1 1.0 - 4.8 K/uL    Absolute Monocytes (Auto) 0.4 0.1 - 1.0 K/uL    Absolute  Eosinophils (Auto) 0.1 0.0 - 0.5 K/uL    Absolute Basophils (Auto) 0.0 0.0 - 0.2 K/uL   Troponin 1 HR   Result Value Ref Range    Troponin (T) Quant High Sensitivity (5th Gen) 12 0 - 19 ng/L       Medications given in the ED  Medications   traMADoL (Ultram) tablet 50 mg (50 mg Oral Given 12/24/22 1643)   cloNIDine HCL (Catapres) tablet 0.1 mg (0.1 mg Oral Given 12/24/22 1810)   acetaminophen (TylenoL) tablet 650 mg (650 mg Oral Given 12/24/22 1810)

## 2022-12-25 ENCOUNTER — Telehealth (INDEPENDENT_AMBULATORY_CARE_PROVIDER_SITE_OTHER): Payer: Self-pay | Admitting: Cardiology

## 2022-12-25 NOTE — Telephone Encounter (Signed)
Pt called to let the provider know she was admitted to [facility] 12/24/22 and the doctor there increased her  cloNIDine (CATAPRES-TTS-1) 0.1 MG/24HR  to 0.'2mg'$ .  please give pt a call if needed.       Ph:[phone] [place] connect

## 2022-12-29 ENCOUNTER — Encounter (INDEPENDENT_AMBULATORY_CARE_PROVIDER_SITE_OTHER): Payer: Self-pay | Admitting: Cardiology

## 2022-12-29 ENCOUNTER — Ambulatory Visit (INDEPENDENT_AMBULATORY_CARE_PROVIDER_SITE_OTHER): Payer: Medicare Other | Admitting: Cardiology

## 2022-12-29 VITALS — BP 143/75 | HR 58 | Temp 98.2°F | Ht 61.0 in | Wt 156.0 lb

## 2022-12-29 DIAGNOSIS — I10 Essential (primary) hypertension: Secondary | ICD-10-CM

## 2022-12-29 DIAGNOSIS — E7800 Pure hypercholesterolemia, unspecified: Secondary | ICD-10-CM

## 2022-12-29 LAB — ECG 12-LEAD
Atrial Rate: 55 {beats}/min
P Axis: 47 degrees
P-R Interval: 148 ms
Q-T Interval: 420 ms
QRS Duration: 82 ms
QTC Calculation (Bezet): 401 ms
R Axis: -16 degrees
T Axis: 21 degrees
Ventricular Rate: 55 {beats}/min

## 2022-12-29 MED ORDER — CLONIDINE 0.2 MG/24HR TD PTWK
1.0000 | MEDICATED_PATCH | TRANSDERMAL | 12 refills | Status: DC
Start: 2022-12-29 — End: 2023-01-06

## 2022-12-29 NOTE — Progress Notes (Signed)
Amber [name] 72 y.o. with a history of HTN presents for cardiac follow up of hypertension.     Since her gastric surgery, she has been intolerant to different medications due to GI distress.       Hypertension: Multiple medication intolerances since bariatric surgery.  Currently she is on atenolol 50 mg daily and clonidine patch 0.2 mg weekly.  Recent ER visit for hypertension.  She was intolerant to the recently prescribed hydrochlorothiazide, nifedipine ( coughing).   With losartan she had significant nausea.  She also had nausea with amlodipine.   She would also like to try benazepril.  She had nausea with lisinopril in the past     Cerebrovascular disease: History of a TIA.  She has had no recurrence.  This TIA occurred when she was not on aspirin.  Currently compliant with aspirin.      Cardiac work up has included     Echocardiogram (04/04/2020): Normal LV/RV size and systolic function.  Ejection fraction 60%.  Mild left ventricular hypertrophy.  No significant valvular heart disease.  No pericardial effusion.                    - stress test (11/06/17 - normal myocardial perfusion)               - cardiac cath ( 01/08/14 - normal )              - echocardiogram ( EF 55%, mild TR)        Current Outpatient Medications   Medication Sig Dispense Refill    aspirin EC 81 MG EC tablet Take 1 tablet (81 mg total) by mouth daily. 30 tablet 1    atenolol (TENORMIN) 50 MG tablet Take 1 tablet (50 mg) by mouth daily 90 tablet 3    Cyanocobalamin (B-12 IJ) Inject as directed every 30 (thirty) days         diazePAM (VALIUM) 5 MG tablet Take 1 tablet (5 mg total) by mouth every 8 (eight) hours as needed for Anxiety 30 tablet 2    famotidine (PEPCID) 20 MG tablet Take 1 tablet (20 mg total) by mouth daily (Patient taking differently: Take 1 tablet (20 mg) by mouth as needed) 90 tablet 1    fluticasone (FLONASE) 50 MCG/ACT nasal spray 2 sprays by Nasal route daily. (Patient taking differently: 2 sprays by Nasal route as  needed) 16 g 2    gabapentin (NEURONTIN) 300 MG capsule daily as needed      Multiple Vitamin (MULTIVITAMIN) capsule Take 1 capsule by mouth daily      ondansetron (ZOFRAN-ODT) 4 MG disintegrating tablet Take 1 tablet (4 mg total) by mouth every 8 (eight) hours as needed for Nausea 30 tablet 2    pantoprazole (PROTONIX) 40 MG tablet TAKE 1 TABLET(40 MG) BY MOUTH DAILY 90 tablet 0    valACYclovir HCL (VALTREX) 500 MG tablet TAKE 1 TABLET(500 MG) BY MOUTH DAILY 90 tablet 1    vitamin D, ergocalciferol, (DRISDOL) 50000 UNIT Cap Take 1 capsule (50,000 Units) by mouth once a week 12 capsule 1    cloNIDine (CATAPRES-TTS-2) 0.2 MG/24HR Place 1 patch onto the skin once a week 4 patch 12     No current facility-administered medications for this visit.            PE:    Vitals:    12/29/22 0757   BP: 143/75   Pulse: (!) 58   Temp:  SpO2:      Body mass index is 29.48 kg/m.    Physical Examination: General appearance - alert, well appearing, and in no distress  Mental status - alert, oriented to person, place, and time  Chest - clear to auscultation, no wheezes, rales or rhonchi, symmetric air entry  Heart - normal rate and regular rhythm, S1 and S2 normal  Abdomen - soft, nontender, nondistended, no masses or organomegaly  Extremities - no pedal edema noted        Labs:  Lipid Panel   Cholesterol   Date/Time Value Ref Range Status   07/11/2022 07:24 AM 209 (H) <200 mg/dL Final     Triglycerides   Date/Time Value Ref Range Status   07/11/2022 07:24 AM 74 <150 mg/dL Final   01/23/2022 09:45 AM 39 34 - 149 mg/dL Final     HDL   Date/Time Value Ref Range Status   07/11/2022 07:24 AM 88 > OR = 50 mg/dL Final       CMP:   Sodium   Date/Time Value Ref Range Status   07/11/2022 07:24 AM 142 135 - 146 mmol/L Final     Potassium   Date/Time Value Ref Range Status   07/11/2022 07:24 AM 4.1 3.5 - 5.3 mmol/L Final     Chloride   Date/Time Value Ref Range Status   07/11/2022 07:24 AM 104 98 - 110 mmol/L Final     CO2   Date/Time Value  Ref Range Status   07/11/2022 07:24 AM 30 20 - 32 mmol/L Final     Glucose   Date/Time Value Ref Range Status   07/11/2022 07:24 AM 90 65 - 99 mg/dL Final     Comment:                   Fasting reference interval          BUN   Date/Time Value Ref Range Status   07/11/2022 07:24 AM 11 7 - 25 mg/dL Final     Protein, Total   Date/Time Value Ref Range Status   07/11/2022 07:24 AM 6.5 6.1 - 8.1 g/dL Final     Alkaline Phosphatase   Date/Time Value Ref Range Status   07/11/2022 07:24 AM 89 37 - 153 U/L Final     AST (SGOT)   Date/Time Value Ref Range Status   07/11/2022 07:24 AM 14 10 - 35 U/L Final     ALT   Date/Time Value Ref Range Status   07/11/2022 07:24 AM 12 6 - 29 U/L Final     Anion Gap   Date/Time Value Ref Range Status   01/23/2022 09:45 AM 4.0 (L) 5.0 - 15.0 Final     Comment:     Calculated AGAP = Na - (CL + CO2)  Interpret with caution; calculated AGAP may not  reflect patient's true clinical status.  This is a calculated value and platform-dependent.  A value >12.0 has been recommended for the management of  Hyperglycemic Crises: Diabetic Ketoacidosis and Hyperglycemic  Hyperosmolar State. Med Clin [place]. 2017;101(3):587-606.  doi:10.1016/j.mcna.2016.12.011         CBC:   WBC   Date/Time Value Ref Range Status   07/11/2022 07:24 AM 4.1 3.8 - 10.8 Thousand/uL Final   06/30/2009 04:03 PM 9.12 3.50 - 10.80 /CUMM Final     RBC   Date/Time Value Ref Range Status   07/11/2022 07:24 AM 4.48 3.80 - 5.10 Million/uL Final     Hemoglobin  Date/Time Value Ref Range Status   07/11/2022 07:24 AM 13.6 11.7 - 15.5 g/dL Final     Hematocrit   Date/Time Value Ref Range Status   07/11/2022 07:24 AM 42.5 35.0 - 45.0 % Final     MCV   Date/Time Value Ref Range Status   07/11/2022 07:24 AM 94.9 80.0 - 100.0 fL Final     MCHC   Date/Time Value Ref Range Status   07/11/2022 07:24 AM 32.0 32.0 - 36.0 g/dL Final     RDW   Date/Time Value Ref Range Status   07/11/2022 07:24 AM 13.6 11.0 - 15.0 % Final     Platelets   Date/Time  Value Ref Range Status   07/11/2022 07:24 AM 217 140 - 400 Thousand/uL Final   08/16/2018 07:34 AM 252 140 - 400 Thousand/uL Final           Impression / plan    Hypertension: Intolerant to many medications since gastric bypass surgery.  Some improvement in blood pressure.  Continue increased dose of clonidine patch 0.2 mg weekly.  Continue atenolol 50 mg daily.    Follow-up with cardiology in 3 months      Verlan Friends, MD  12/29/2022

## 2023-01-01 ENCOUNTER — Encounter (INDEPENDENT_AMBULATORY_CARE_PROVIDER_SITE_OTHER): Payer: Self-pay | Admitting: Surgery

## 2023-01-01 DIAGNOSIS — Z9884 Bariatric surgery status: Secondary | ICD-10-CM

## 2023-01-01 DIAGNOSIS — K9089 Other intestinal malabsorption: Secondary | ICD-10-CM

## 2023-01-01 DIAGNOSIS — E60 Dietary zinc deficiency: Secondary | ICD-10-CM

## 2023-01-01 DIAGNOSIS — E559 Vitamin D deficiency, unspecified: Secondary | ICD-10-CM

## 2023-01-05 ENCOUNTER — Encounter (INDEPENDENT_AMBULATORY_CARE_PROVIDER_SITE_OTHER): Payer: Self-pay | Admitting: Cardiology

## 2023-01-06 ENCOUNTER — Other Ambulatory Visit (INDEPENDENT_AMBULATORY_CARE_PROVIDER_SITE_OTHER): Payer: Self-pay | Admitting: Cardiology

## 2023-01-06 MED ORDER — CLONIDINE HCL 0.1 MG PO TABS
0.1000 mg | ORAL_TABLET | Freq: Two times a day (BID) | ORAL | 0 refills | Status: DC
Start: 2023-01-06 — End: 2023-01-11

## 2023-01-07 ENCOUNTER — Ambulatory Visit (INDEPENDENT_AMBULATORY_CARE_PROVIDER_SITE_OTHER): Payer: Medicare Other | Admitting: Surgery

## 2023-01-08 ENCOUNTER — Encounter (INDEPENDENT_AMBULATORY_CARE_PROVIDER_SITE_OTHER): Payer: Self-pay

## 2023-01-09 ENCOUNTER — Encounter (INDEPENDENT_AMBULATORY_CARE_PROVIDER_SITE_OTHER): Payer: Self-pay

## 2023-01-10 ENCOUNTER — Encounter (INDEPENDENT_AMBULATORY_CARE_PROVIDER_SITE_OTHER): Payer: Self-pay | Admitting: Cardiology

## 2023-01-10 LAB — CBC
Hematocrit: 43.6 % (ref 35.0–45.0)
Hemoglobin: 13.8 g/dL (ref 11.7–15.5)
MCH: 28.5 pg (ref 27.0–33.0)
MCHC: 31.7 g/dL — ABNORMAL LOW (ref 32.0–36.0)
MCV: 89.9 fL (ref 80.0–100.0)
MPV: 9.6 fL (ref 7.5–12.5)
Platelets: 261 10*3/uL (ref 140–400)
RBC: 4.85 10*6/uL (ref 3.80–5.10)
RDW: 13.7 % (ref 11.0–15.0)
WBC: 3.6 10*3/uL — ABNORMAL LOW (ref 3.8–10.8)

## 2023-01-10 LAB — LIPID PANEL
Cholesterol / HDL Ratio: 2.4 (calc) (ref <5.0)
Cholesterol: 188 mg/dL (ref <200)
HDL: 77 mg/dL (ref >=50)
LDL Calculated: 97 mg/dL (calc)
NON HDL CHOLESTEROL: 111 mg/dL (calc) (ref <130)
Triglycerides: 59 mg/dL (ref <150)

## 2023-01-10 LAB — IRON PROFILE
Iron Saturation: 37 % (calc) (ref 16–45)
Iron: 112 ug/dL (ref 45–160)
TIBC: 303 mcg/dL (calc) (ref 250–450)

## 2023-01-10 LAB — COMPREHENSIVE METABOLIC PANEL
ALT: 15 U/L (ref 6–29)
AST (SGOT): 21 U/L (ref 10–35)
Albumin/Globulin Ratio: 1.6 (calc) (ref 1.0–2.5)
Albumin: 4.1 g/dL (ref 3.6–5.1)
Alkaline Phosphatase: 102 U/L (ref 37–153)
BUN / Creatinine Ratio: 17 (calc) (ref 6–22)
BUN: 9 mg/dL (ref 7–25)
Bilirubin, Total: 0.4 mg/dL (ref 0.2–1.2)
CO2: 29 mmol/L (ref 20–32)
Calcium: 9.2 mg/dL (ref 8.6–10.4)
Chloride: 106 mmol/L (ref 98–110)
Creatinine: 0.53 mg/dL — ABNORMAL LOW (ref 0.60–1.00)
Globulin: 2.5 g/dL (calc) (ref 1.9–3.7)
Glucose: 87 mg/dL (ref 65–99)
Potassium: 4.4 mmol/L (ref 3.5–5.3)
Protein, Total: 6.6 g/dL (ref 6.1–8.1)
Sodium: 142 mmol/L (ref 135–146)
eGFR: 99 mL/min/{1.73_m2} (ref >=60)

## 2023-01-10 LAB — COPPER: Copper: 102 ug/dL (ref 70–175)

## 2023-01-10 LAB — FOLATE: Folate: 8 ng/mL

## 2023-01-10 LAB — VITAMIN A: Vitamin A: 44 ug/dL (ref 38–98)

## 2023-01-10 LAB — WHOLE BLOOD VITAMIN B1 (THIAMINE): Thiamin (Vitamin B1), WB: 101 nmol/L (ref 78–185)

## 2023-01-10 LAB — VITAMIN B12: Vitamin B-12: 623 pg/mL (ref 200–1100)

## 2023-01-10 LAB — VITAMIN D, 25 OH, TOTAL: Vitamin D, 25 OH, Total: 12 ng/mL — ABNORMAL LOW (ref 30–100)

## 2023-01-10 LAB — ZINC: Zinc: 73 ug/dL (ref 60–130)

## 2023-01-10 LAB — PTH, INTACT: PARATHYROID HORMONE INTACT: 117 pg/mL — ABNORMAL HIGH (ref 16–77)

## 2023-01-11 ENCOUNTER — Other Ambulatory Visit (INDEPENDENT_AMBULATORY_CARE_PROVIDER_SITE_OTHER): Payer: Self-pay | Admitting: Cardiology

## 2023-01-11 MED ORDER — LOSARTAN POTASSIUM 50 MG PO TABS
50.0000 mg | ORAL_TABLET | Freq: Every day | ORAL | 3 refills | Status: DC
Start: 2023-01-11 — End: 2023-03-11

## 2023-01-14 ENCOUNTER — Telehealth (HOSPITAL_BASED_OUTPATIENT_CLINIC_OR_DEPARTMENT_OTHER): Payer: Self-pay

## 2023-01-14 NOTE — Telephone Encounter (Signed)
Patient called to schedule Office Visit with MD.     Patient is retiring and wanted to complete Office Visit prior to Insurance switching. Scheduled for Friday 04/12.     Patient would also like to know if needing to complete Lab work as she did not back in November     Please advise.

## 2023-01-15 ENCOUNTER — Encounter (HOSPITAL_BASED_OUTPATIENT_CLINIC_OR_DEPARTMENT_OTHER): Payer: Self-pay | Admitting: Registered"

## 2023-01-16 IMAGING — CT CT HEAD W/O CM
3 series · 16 of 47 positions shown, 19 images · non-contrast
Comparison: None.

CLINICAL DATA: Blunt trauma post fall

EXAM:
CT HEAD WITHOUT CONTRAST
TECHNIQUE: Contiguous axial images were obtained from the base of the skull
through the vertex without intravenous contrast.

[Series 2: head wo · axial · 0.46mm/px · z∈[+970,+1100]mm · 10 of 32 slices shown, 13 images]
[im 3/32  brain]
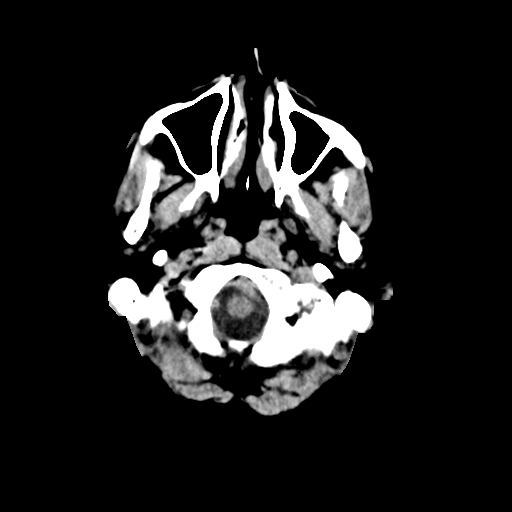
[im 3/32  bone]
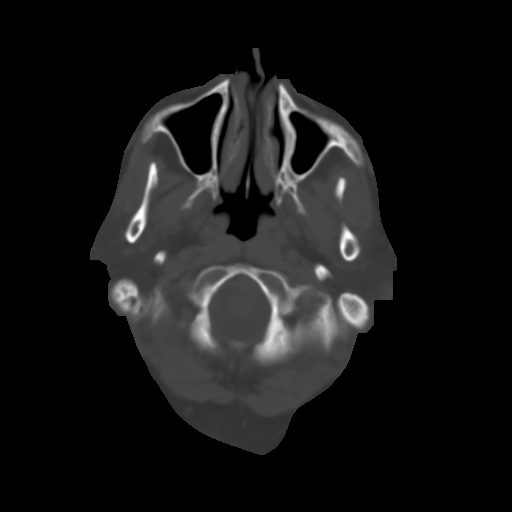
[im 6/32  brain]
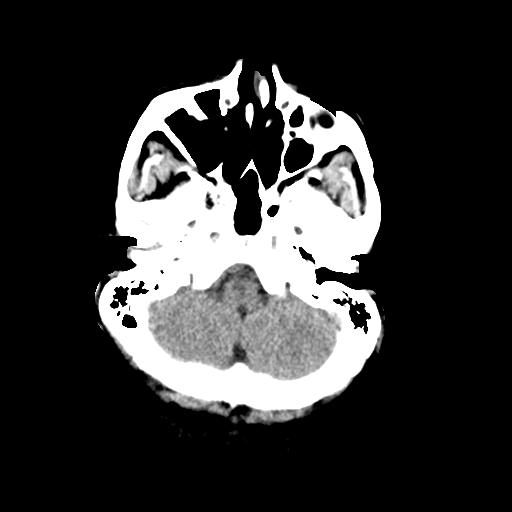
[im 9/32  brain]
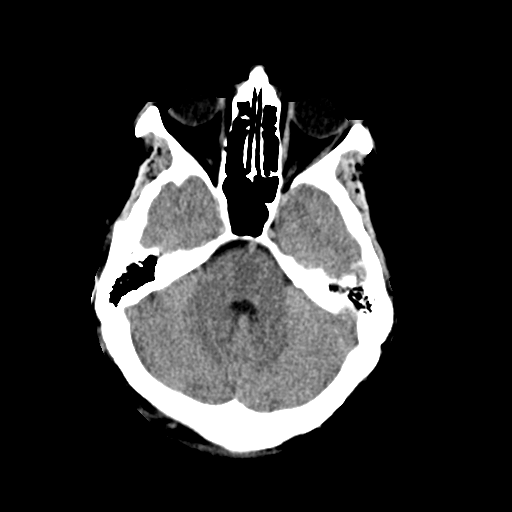
[im 11/32  brain]
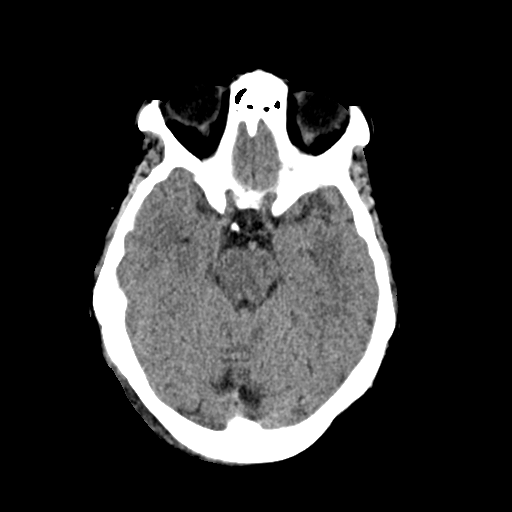
[im 14/32  brain]
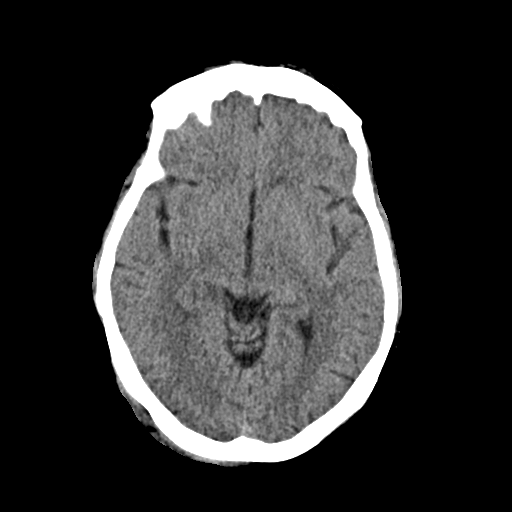
[im 14/32  bone]
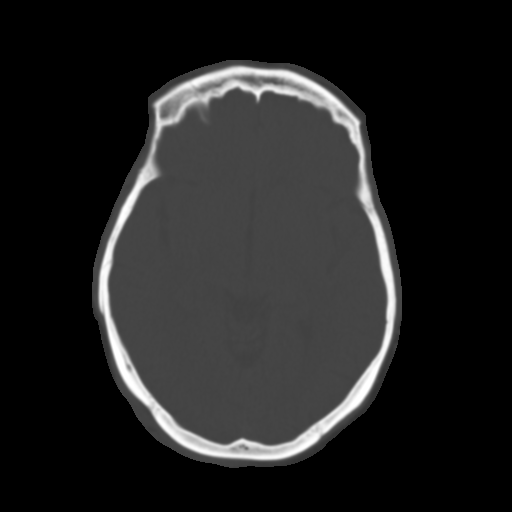
[im 18/32  brain]
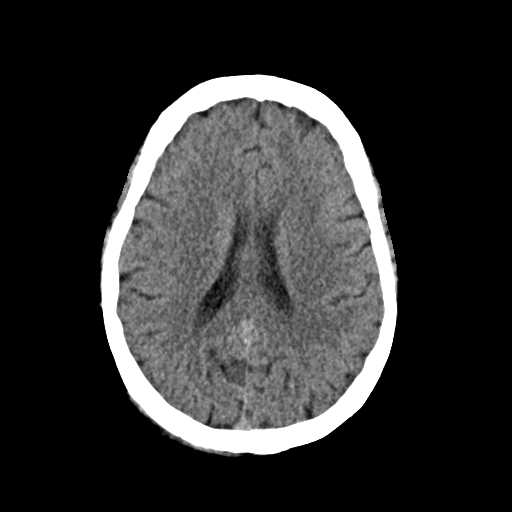
[im 21/32  brain]
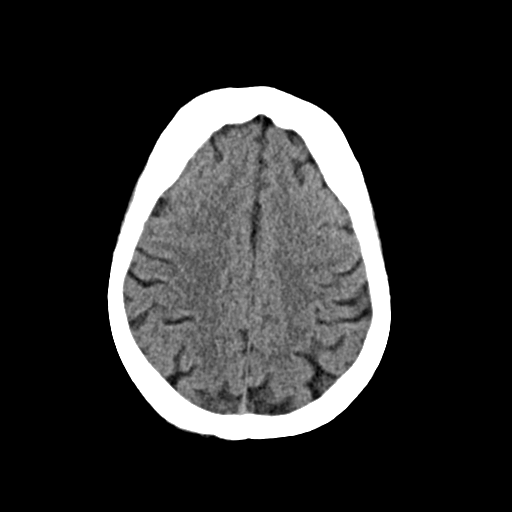
[im 24/32  brain]
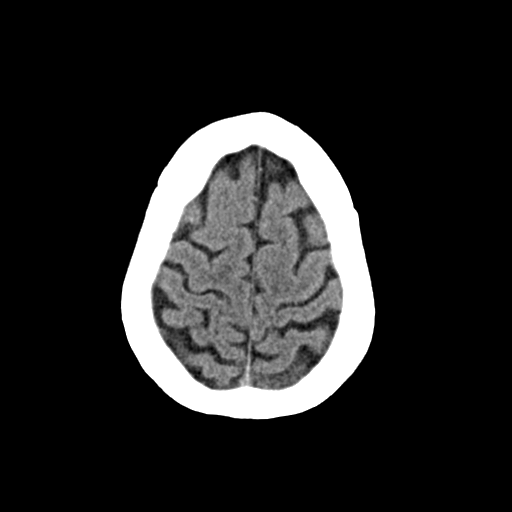
[im 26/32  brain]
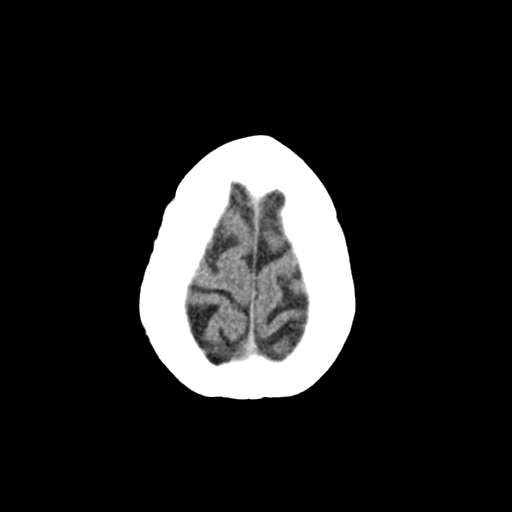
[im 26/32  bone]
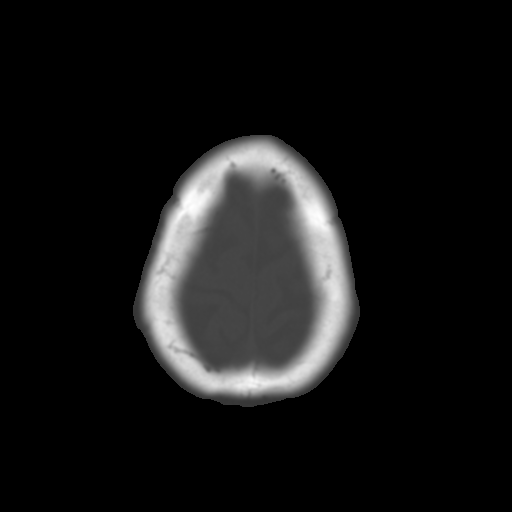
[im 29/32  brain]
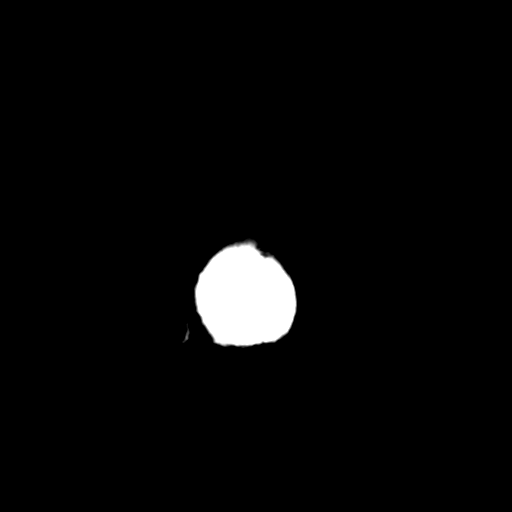

[Series 4: coronal soft · coronal · 0.33mm/px · 3 of 70 slices shown]
[im 24/70  brain]
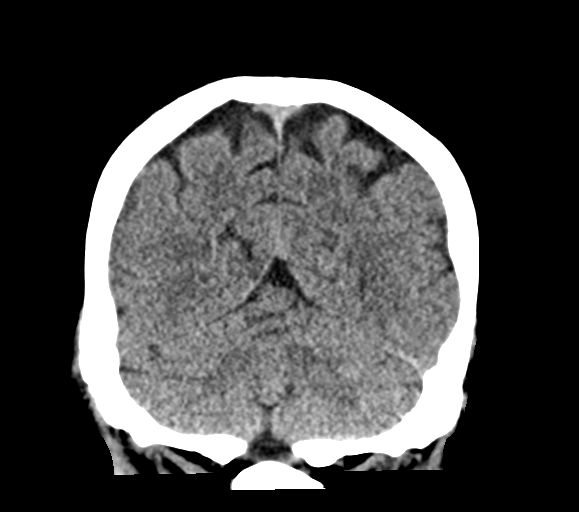
[im 31/70  brain]
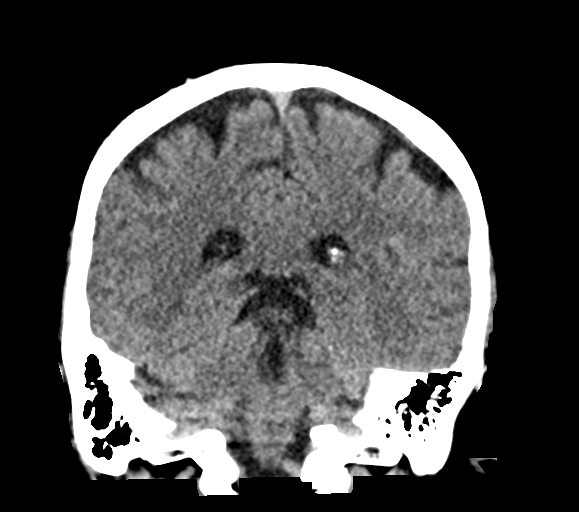
[im 39/70  brain]
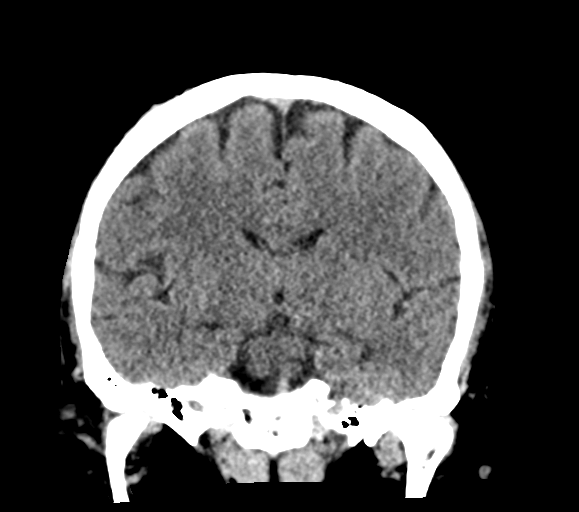

[Series 5: sag soft · sagittal · 0.33mm/px · 3 of 57 slices shown]
[im 19/57  brain]
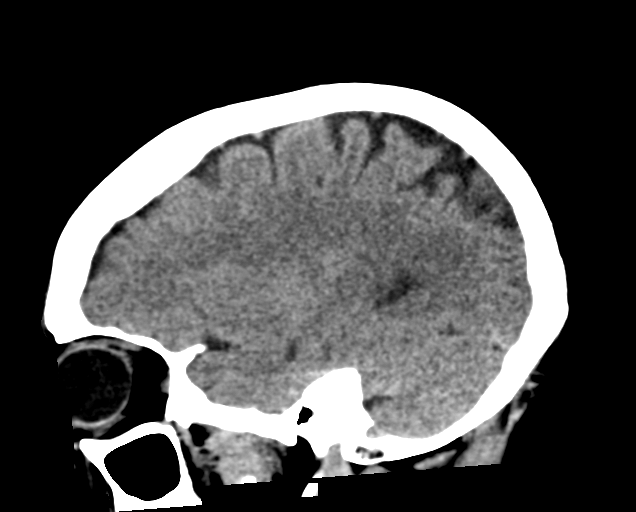
[im 29/57  brain]
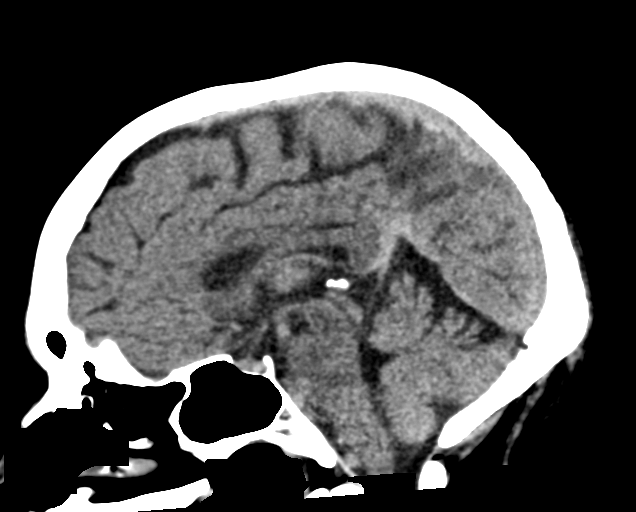
[im 38/57  brain]
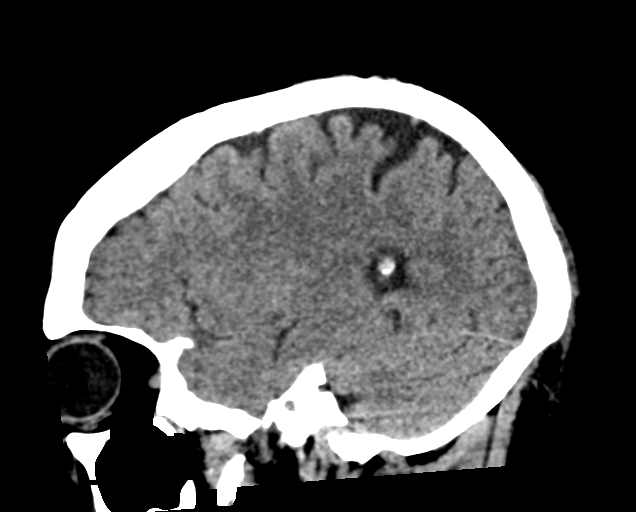

[16 of 47 positions shown; findings below may reference images not displayed]

FINDINGS: Brain: No evidence of acute infarction, hemorrhage, hydrocephalus,
extra-axial collection or mass lesion/mass effect.

Vascular: No hyperdense vessel or unexpected calcification.

Skull: Normal. Negative for fracture or focal lesion.

Sinuses/Orbits: No acute finding.  Hypoplastic frontal sinuses.

Other: None
IMPRESSION: Negative

## 2023-01-21 ENCOUNTER — Other Ambulatory Visit (INDEPENDENT_AMBULATORY_CARE_PROVIDER_SITE_OTHER): Payer: Self-pay | Admitting: Internal Medicine

## 2023-01-21 DIAGNOSIS — A6000 Herpesviral infection of urogenital system, unspecified: Secondary | ICD-10-CM

## 2023-01-22 NOTE — Telephone Encounter (Signed)
Called and lvm for patient to give the office a call to schedule a f/up appointment     Thank you

## 2023-02-08 ENCOUNTER — Encounter (INDEPENDENT_AMBULATORY_CARE_PROVIDER_SITE_OTHER): Payer: Self-pay

## 2023-02-09 ENCOUNTER — Encounter (INDEPENDENT_AMBULATORY_CARE_PROVIDER_SITE_OTHER): Payer: Self-pay

## 2023-02-12 ENCOUNTER — Ambulatory Visit: Payer: No Typology Code available for payment source | Attending: Internal Medicine | Admitting: Internal Medicine

## 2023-02-12 DIAGNOSIS — Z803 Family history of malignant neoplasm of breast: Secondary | ICD-10-CM | POA: Insufficient documentation

## 2023-02-12 DIAGNOSIS — K862 Cyst of pancreas: Secondary | ICD-10-CM | POA: Insufficient documentation

## 2023-02-12 DIAGNOSIS — C50911 Malignant neoplasm of unspecified site of right female breast: Secondary | ICD-10-CM

## 2023-02-12 DIAGNOSIS — Z853 Personal history of malignant neoplasm of breast: Secondary | ICD-10-CM | POA: Insufficient documentation

## 2023-02-12 DIAGNOSIS — Z1501 Genetic susceptibility to malignant neoplasm of breast: Secondary | ICD-10-CM | POA: Insufficient documentation

## 2023-02-12 DIAGNOSIS — Z8481 Family history of carrier of genetic disease: Secondary | ICD-10-CM | POA: Insufficient documentation

## 2023-02-12 DIAGNOSIS — Z1509 Genetic susceptibility to other malignant neoplasm: Secondary | ICD-10-CM | POA: Insufficient documentation

## 2023-02-12 DIAGNOSIS — Z08 Encounter for follow-up examination after completed treatment for malignant neoplasm: Secondary | ICD-10-CM | POA: Insufficient documentation

## 2023-02-12 DIAGNOSIS — C50912 Malignant neoplasm of unspecified site of left female breast: Secondary | ICD-10-CM

## 2023-02-12 NOTE — ED Notes (Signed)
Pain assessment on discharge was 0.  Condition stable.  Patient discharged to home.  Patient education was completed:  yes  Education taught to:  patient  Teaching method used was discussion.  Understanding of teaching was good.  Patient was discharged ambulatory.  Discharged with self.  Valuables were given to: patient.

## 2023-02-12 NOTE — ED Provider Notes (Signed)
[facility] EMERGENCY DEPT    Time of Arrival:   02/12/23 1231        Final diagnoses:   [R07.89] Atypical chest pain (Primary)   [R10.10] Acute upper abdominal pain       Medical Decision Making:      Differential/Questionable Diagnosis:   acs, pancreatitis, gerd, aaa, renal colic, electrolyte disturbance, others;    Social determinants: social factors reviewed, did not limit treatment                                Glasgow Coma Scale Score: 15               Supplemental Historians include:  patient     ED Course:        As of 02/19/2023, 8:28 AM  The differential diagnosis and / or critical care lists were considered including infections, sepsis, severe sepsis, and septic shock and found unlikely unless otherwise documented in the final clinical impression or diagnosis list.         Documentation/Prior Results Review:    Nursing notes:  from this ED visit;    Rhythm interpretation from monitor: sinus bradycardia    Imaging Interpreted by me: X-Ray no sig fluid overload, no obvious consolidation, no lobar infiltrate;     CT ABD/PELVIS-IV ONLY   Final Result   IMPRESSION:   1. No acute abnormality in the abdomen/pelvis.   2. Hepatomegaly and hepatic steatosis.   3. Colonic diverticulosis without diverticulitis.   4. Uterine fibroids.      Aortic Atherosclerosis (ICD10-I70.0).         Electronically Signed     By: Narda Rutherford M.D.     On: 02/12/2023 15:26         CHEST PORTABLE   Final Result   IMPRESSION:   Stable top-normal heart size. No acute findings.         Electronically Signed     By: Irish Lack M.D.     On: 02/12/2023 13:40         EKG 12 LEAD UNIT PERFORMED   Final Result          .     Discussion of Management with other Physicians, QHP or Appropriate Source:   None    .    Disposition:  Home    New Prescriptions    No medications on file     Chief Complaint   Patient presents with   . CHEST PAIN (ADULT)       72 y/o female with past medical history of HTN, TIA,  presenting to the ED via EMS c/o chest pain for the past two hours.  EMS gave pt 0.4 mg of sl nitro and 324 mg of aspirin, but this did not improve her pain.  EMS reports pt was hypertensive in the field with a sysolic bp of 210.  Pt is c/o chest pain and abdominal pain for the past 2 hours.  She denies any trauma or injury or fall.  She is c/o upper abdominal pain that does not radiate anywhere.  She is also c/o chest pain over the middle of her chest that does not radiate anywhere.  She denies any prior history of a heart attack or of a blood clot in her legs or lungs.  Pain is currently 10/10.  Pain is associated with nausea, but this has improved  after being given zofran by EMS.  She has a history of gerd, but she thinks the pain in her abdomen is not being caused by her gerd at this time.  There is no exertional component and there is no associated diaphoresis or lightheadedness or dizziness.  No associated fever, chills, vomiting, diarrhea, shortness of breath, cough, urinary complaints.  With regards to the chest and abdominal pain there are no known alleviating factors.            1234 PT presents with 9/10 CP and ABD pain, 324 ASA and 0.4mg  nitro given en route, zfran 4mg . Pain started approx 2 hours PTA, hypertensive with a EMS systolic of 210          History provided by:  Patient  Language interpreter used: No    CHEST PAIN (ADULT)  Associated symptoms: abdominal pain and nausea    Associated symptoms: no back pain, no cough, no fever, no headache, no shortness of breath and no vomiting         Review of Systems:  Constitutional:  Negative for fever and chills.   HENT:  Negative for neck pain.    Respiratory:  Negative for cough and shortness of breath.    Cardiovascular:  Positive for chest pain.   Gastrointestinal:  Positive for nausea and abdominal pain. Negative for vomiting and diarrhea.   Genitourinary:  Negative for dysuria.   Musculoskeletal:  Negative for back pain.   Neurological:  Negative for  headaches.       Physical Exam  Vitals and nursing note reviewed.   Constitutional:       General: She is not in acute distress.     Appearance: Normal appearance. She is not ill-appearing or toxic-appearing.   HENT:      Head: Normocephalic and atraumatic.      Mouth/Throat:      Mouth: Mucous membranes are moist.      Pharynx: No oropharyngeal exudate or posterior oropharyngeal erythema.   Eyes:      Conjunctiva/sclera: Conjunctivae normal.   Cardiovascular:      Rate and Rhythm: Normal rate and regular rhythm.      Heart sounds: Normal heart sounds.   Pulmonary:      Effort: Pulmonary effort is normal. No respiratory distress.      Breath sounds: Normal breath sounds. No stridor.   Abdominal:      General: Abdomen is flat. There is no distension.      Palpations: Abdomen is soft.      Tenderness: There is no abdominal tenderness. There is no guarding or rebound.   Musculoskeletal:         General: Normal range of motion.      Cervical back: Neck supple. No rigidity.   Skin:     General: Skin is warm and dry.   Neurological:      General: No focal deficit present.      Mental Status: She is alert.   Psychiatric:         Mood and Affect: Mood normal.         Behavior: Behavior normal.         Past Medical History:   Diagnosis Date   . Acid reflux    . Arthropathy     KNEES   . Cerebral artery occlusion with cerebral infarction (HCC)     TIA.....2 LAST YEAR   . Decreased exercise tolerance    . Esophageal hiatal hernia    .  Esophageal reflux    . GERD (gastroesophageal reflux disease)    . Hiatal hernia    . Hypertension    . Sleep apnea     uses cpap   . TIA (transient ischemic attack)      Past Surgical History:   Procedure Laterality Date   . APPENDECTOMY     . CESAREAN SECTION     . GASTRIC BYPASS  10/22/2015   . HERNIA REPAIR  2016    DEC 20   . LAP CHOLECYSTECTOMY N/A 08/13/2014    Procedure: LAPAROSCOPIC CHOLECYSTECTOMY WITH CHOLANGIOGRAPHY;  Surgeon: Josefa Half R, DO;  Location: [facility] MAIN OR LOC;   Service: General;  Laterality: N/A;  lap chole w/ ioc   . SPINE SURGERY N/A 11/16/2013    Procedure: CERVICAL ANTERIOR DISCECTOMY/ LAMINECTOMY WITH FUSION Cervical 5 through 7;  Surgeon: Vella Raring, MD;  Location: [facility] MAIN OR LOC;  Service: Orthopedics;  Laterality: N/A;     Family History   Problem Relation Age of Onset   . Cancer Mother    . Heart Failure Father    . Diabetes Brother    . Seizures Brother    . Cancer Maternal Aunt    . Diabetes 1st Cousin    . Anesthesia Reaction Neg Hx      Social History     Occupational History   . Not on file   Tobacco Use   . Smoking status: Never   . Smokeless tobacco: Never   Vaping Use   . Vaping status: Never Used   Substance and Sexual Activity   . Alcohol use: No   . Drug use: No   . Sexual activity: Not on file     No outpatient medications have been marked as taking for the 02/12/23 encounter [facility] Encounter).     Allergies   Allergen Reactions   . Acyclovir gi distress   . Amoxicillin-Pot Clavulanate gi distress   . Asa Buff (Mag Carb-Al Glyc) [Aspirin, Buffered] gi distress     Upsets stomach   . Aspirin gi distress     Patient states "it upsets my stomach"   . Atorvastatin other/intolerance     Palpitation   . Lovastatin gi distress   . Metronidazole gi distress and neurological reaction   . Moxifloxacin gi distress     GI symptoms   . Statins-Hmg-Coa Reductase Inhibitors gi distress and unknown     Palpitations, chest pain, abd pain    . Sulfa (Sulfonamide Antibiotics) gi distress       Vital Signs:  Patient Vitals for the past 72 hrs:   Temp Heart Rate Pulse Resp BP BP Mean SpO2 Weight   02/12/23 1645 -- 56 55 14 162/77 95 MM HG 97 % --   02/12/23 1600 -- 56 56 16 163/85 (!) 104 MM HG 97 % --   02/12/23 1545 -- 58 58 17 165/82 (!) 101 MM HG 96 % --   02/12/23 1430 -- 51 51 15 162/83 99 MM HG 97 % --   02/12/23 1415 -- 50 50 16 158/82 98 MM HG 99 % --   02/12/23 1300 -- 57 58 19 161/86 (!) 103 MM HG 98 % --   02/12/23 1244 -- -- -- -- -- -- -- 64.4 kg  (142 lb)   02/12/23 1242 98.6 F (37 C) 58 -- 20 175/88 (!) 117 MM HG 99 % --   02/12/23 1235 -- 62 -- -- -- -- 98 % --  Diagnostics:  Labs:    Results for orders placed or performed during the hospital encounter of 02/12/23   BASIC METABOLIC PANEL   Result Value Ref Range    Potassium 3.8 3.5 - 5.5 mmol/L    Sodium 142 133 - 145 mmol/L    Chloride 107 98 - 110 mmol/L    Glucose 104 (H) 70 - 99 mg/dL    Calcium 8.9 8.4 - 16.1 mg/dL    BUN 7 6 - 22 mg/dL    Creatinine 0.4 (L) 0.8 - 1.4 mg/dL    CO2 25 20 - 32 mmol/L    eGFR >60.0 >60.0 mL/min/1.73 sq.m.    Anion Gap 10.0 3.0 - 15.0 mmol/L   TROPONIN   Result Value Ref Range    Troponin (T) Quant High Sensitivity (5th Gen) 7 0 - 19 ng/L   CBC WITH DIFFERENTIAL AUTO   Result Value Ref Range    WBC 4.2 4.0 - 11.0 K/uL    RBC 4.56 3.80 - 5.20 M/uL    HGB 13.2 11.7 - 16.1 g/dL    HCT 09.6 04.5 - 40.9 %    MCV 86 80 - 99 fL    MCH 29 26 - 34 pg    MCHC 34 31 - 36 g/dL    RDW 81.1 91.4 - 78.2 %    Platelet 235 140 - 440 K/uL    MPV 9.7 9.0 - 13.0 fL    Segmented Neutrophils (Auto) 61 40 - 75 %    Lymphocytes (Auto) 28 20 - 45 %    Monocytes (Auto) 9 3 - 12 %    Eosinophils (Auto) 2 0 - 6 %    Basophils (Auto) 1 0 - 2 %    Absolute Neutrophils (Auto) 2.6 1.8 - 7.7 K/uL    Absolute Lymphocytes (Auto) 1.2 1.0 - 4.8 K/uL    Absolute Monocytes (Auto) 0.4 0.1 - 1.0 K/uL    Absolute Eosinophils (Auto) 0.1 0.0 - 0.5 K/uL    Absolute Basophils (Auto) 0.0 0.0 - 0.2 K/uL   HEPATIC FUNCTION PANEL   Result Value Ref Range    Albumin 4.3 3.5 - 5.0 g/dL    Total Protein 6.8 6.2 - 8.1 g/dL    Globulin 2.5 2.0 - 4.0 g/dL    A/G Ratio 1.7 1.1 - 2.6 ratio    Bilirubin Total 0.3 0.2 - 1.2 mg/dL    Bilirubin Direct <9.5 0.0 - 0.3 mg/dL    SGOT (AST) 15 10 - 37 U/L    Alkaline Phosphatase 100 40 - 120 U/L    SGPT (ALT) 10 5 - 40 U/L   Lipase   Result Value Ref Range    Lipase <20 7 - 60 U/L   TROPONIN   Result Value Ref Range    Troponin (T) Quant High Sensitivity (5th Gen) 6 0 - 19 ng/L      ECG:  Results for orders placed or performed during the hospital encounter of 02/12/23   EKG 12 LEAD UNIT PERFORMED   Result Value Ref Range Status    Heart Rate 54 bpm Final    RR Interval 1,104 ms Final    Atrial Rate 54 ms Final    P-R Interval 168 ms Final    P Duration 130 ms Final    P Horizontal Axis -8 deg Final    P Front Axis 34 deg Final    Q Onset 499 ms Final    QRSD Interval 93 ms  Final    QT Interval 438 ms Final    QTcB 417 ms Final    QTcF 423 ms Final    QRS Horizontal Axis -17 deg Final    QRS Axis -10 deg Final    I-40 Front Axis 90 deg Final    t-40 Horizontal Axis -21 deg Final    T-40 Front Axis -22 deg Final    T Horizontal Axis 0 deg Final    T Wave Axis 17 deg Final    S-T Horizontal Axis  deg Final    S-T Front Axis 57 deg Final    Impression - ABNORMAL ECG -  Final    Impression SB-Sinus bradycardia-rate<0  Final    Impression   Final     -Abnormal R-wave progression, early transition - reversed leads V2 - V4-    Impression   Final     -Left ventricular hypertrophy, unchanged since 12/24/2022 -     My interpretation of the EKG: Sinus bradycardia with a rate of 54 bpm, there is no evidence of an ST segment elevated MI, QRS intervals within normal limits, there are nonspecific ST-T wave changes;         Medications ordered/given in the ED  Medications   famotidine (PF) (Pepcid) injection 20 mg (20 mg IV Push Given 02/12/23 1442)   ondansetron (PF) (Zofran) injection 4 mg (4 mg IV Push Given 02/12/23 1442)   iohexoL (OmniPaque) 300 mg iodine/mL 100 mL (100 mL Intravenous Given 02/12/23 1502)     --re-eval--she is nad, she is resting comfortably, she denies any pain at all at this time, she is feeling better, no further complaints at this time, there are no signs of an acute abdomen at this time, she was updated of the results/including the imaging results and also of the possible diagnoses, we discussed the discharge plan and the discharge instructions;    Discussed the results with the  patient who acknowledges understanding.  Will d/c home with appropriate pcp and cardiologist f/u.  She was instructed to return to the ED if sxs worsen.  She is agreeable.    Pt is clinically stable for d/c home at this time; Return precautions given.  No significant concern based on presentation or clinical course to warrant further testing in the emergency department.  At discharge, pt looked well, no distress, and is a good candidate for outpatient follow up.  Results discussed at length with patient/family.  All questions were answered.   They will return immediately to the emergency department if any new or worsened symptoms.     Steward Ros, MD

## 2023-02-12 NOTE — Progress Notes (Signed)
[name]      CC: Jasmine Hunt is a very pleasant 72 year old female with a history of bilateral nonsynchronous breast carcinoma and BRCA1 + who presents today for a follow up    ASSESSMENT/PLAN    1. 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 Patient received 6 cycles of CMF chemotherapy followed by radiation therapy.      2. BRCA1 mutation positive. S/p bilateral salpingo-oophorectomy. She has been followed by GI with endoscopies and pancreatic MRI.    Patient clinically is doing well.   She is also on high risk breast cancer surveillance with yearly screening mammogram alternating with yearly bilateral screening MRI both at [facility]    Last breast MRI on 08/28/2022: Benign finding     -Screening Mammogram in 11/2022 was stable and benign.     Continue with high risk screening with breast MRI and mammogram annually alternating. Due for a repeat mammogram in January 2025, Breast MRI in October 2024    Pancreatic cancer surveillance screening. family history. Brca1 +.  - Patient follows Dr [name]. MRI alternating with endoscopy every other year  - 07/19/2022 MRI: Pancreatic head cystic lesion, 4 mm, essentially unchanged from 2014. No suspicious features.     RTC in 6 months, sooner if needed     ONCOLOGIC HISTORY   I have reviewed pertinent oncologic records, available images and pathology.  Summary of oncologic history is below.     Patient has been followed by Dr. [name] at [facility] but she just retired. 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 Unfortunately I do not have all the  details about her cancers including grade. Looks like initially patient was tested negative for the genetic mutation but then she was referred to [facility] and looks like she turned out to be BRCA1 positive. She has extensive history of cancer in her family as above including father with prostate, mother with pancreatic, sister with pancreatic and breast and another sister with breast cancer x2. Looks like her brother who does not have cancer just test positive. Patient also developed left breast carcinoma in 2006 and it was a stage I and she underwent 6 cycles of CMF chemotherapy followed by radiation therapy. Also in 2006 she underwent bilateral salpingo-oophorectomy and for the last few years she has been following with GI in [place] and having EGD down to the pancreas. She also had MRI of the pancreas in September 2021 that was unremarkable. Overall patient is doing well and she denies any problems specifically she denies any issues with her breasts, she has no abdominal pain or any symptoms.    CURRENT TREATMENT  Surveillance    INTERVAL HISTORY      Doing quite well.  She reports no new symptoms or concerns.   works full-time for the [employer], thinking its time to retire.  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)    BRCA1 gene mutation positive           PAST SURGICAL HISTORY    Past Surgical History:   Procedure Laterality Date    BREAST BIOPSY Bilateral     2001, 2006    BREAST LUMPECTOMY      2001 & 2006    HYSTERECTOMY  2008    SHOULDER SURGERY  2015           FAMILY HISTORY    Family History       Problem (# of Occurrences) Relation (Name,Age of Onset)    Breast Cancer (3) Mother (15), Sister Marisue Ivan Vigil), Sister    BRCA Mutation (1) Sister Marisue Ivan  Vigil)        Strong family history with numerous family members with BRCA 1 positivity    SOCIAL HISTORY    Social History     Social History Narrative    Not on file           ALLERGIES    Review of patient's allergies indicates:  Allergies   Allergen Reactions    Dust Mite Mixed Allergen Ext [Mite (D. Farinae)] Unknown     Allergy test came back positive for dust, reaction is unknown.         MEDICATIONS      Current Outpatient Medications:     amLODIPine 5 MG tablet, Take 5 mg by mouth daily., Disp: , Rfl:     atorvastatin 20 MG tablet, Take 20 mg by mouth at bedtime., Disp: , Rfl:     cholecalciferol 50 mcg (2,000 unit) capsule, Take 2,000 units by mouth daily., Disp: , Rfl:     Coenzyme Q10 (COQ-10 OR), Take 1 tablet by mouth as needed., Disp: , Rfl:     Cyanocobalamin (VITAMIN B-12 OR), Take 1 tablet by mouth as needed., Disp: , Rfl:     mometasone 50 MCG/ACT nasal spray, Spray 2 sprays in the nostril as needed., Disp: , Rfl:     Multiple Vitamin (Multi-Vitamin) tablet, Take 1 tablet by mouth daily., Disp: , Rfl:     Omega-3 Fatty Acids (OMEGA 3 OR), Take 1 tablet by mouth as needed., Disp: , Rfl:     OMEPRAZOLE OR, Take 1 tablet by mouth as needed., Disp: , Rfl:     polyethylene glycol 3350 with electrolytes (Golytely) 236 g solution, Take per instructions already provided to patient for bowel prep., Disp: 4000 mL, Rfl: 0      REVIEW OF SYSTEMS  A full ROS was obtained as part of the intake form, which has been reviewed and signed.   As per Interval History above, a complete ROS is otherwise negative.      PHYSICAL EXAM    BP 138/84   Pulse 96   Temp 36.7 C (Oral)   Resp 16   Wt 76.2 kg (168 lb 1.6 oz)   SpO2 96%   BMI 30.75 kg/m     ECOG: 0  General: Pleasant, well-appearing. No acute distress.  HEENT: Sclerae anicteric.   Lungs: Breathing unlabored.  Breast: Well-healed the scars over both breasts.  No palpable masses.  No nipple changes and no palpable bilateral axillary nodes.  Abdomen: Bowel  sounds present. Soft, nontender, nondistended.   Neuro: Alert and oriented x4. No focal deficits.  Skin: Without rash.    LABORATORY RESULTS    Results for orders placed or performed in visit on 02/23/22   CBC  with Differential   Result Value Ref Range    WBC 6.59 4.3 - 10.0 10*3/uL    RBC 4.60 3.80 - 5.00 10*6/uL    Hemoglobin 12.3 11.5 - 15.5 g/dL    Hematocrit 39 16.1 - 45.0 %    MCV 84 81 - 98 fL    MCH 26.7 (L) 27.3 - 33.6 pg    MCHC 31.9 (L) 32.2 - 36.5 g/dL    Platelet Count 096 045 - 400 10*3/uL    RDW-CV 14.5 11.0 - 14.5 %    % Neutrophils 42 %    % Lymphocytes 42 %    % Monocytes 12 %    % Eosinophils 3 %    % Basophils 1 %    % Immature Granulocytes 0 %    Neutrophils 2.76 1.80 - 7.00 10*3/uL    Absolute Lymphocyte Count 2.74 1.00 - 4.80 10*3/uL    Monocytes 0.82 (H) 0.00 - 0.80 10*3/uL    Absolute Eosinophil Count 0.21 0.00 - 0.50 10*3/uL    Basophils 0.05 0.00 - 0.20 10*3/uL    Immature Granulocytes 0.01 0.00 - 0.05 10*3/uL    Nucleated RBC 0.00 0.00 10*3/uL    % Nucleated RBC 0 %   Comprehensive Metabolic Panel   Result Value Ref Range    Sodium 136 135 - 145 meq/L    Potassium 3.9 3.6 - 5.2 meq/L    Chloride 104 98 - 108 meq/L    Carbon Dioxide, Total 26 22 - 32 meq/L    Anion Gap 6 4 - 12    Glucose 92 62 - 125 mg/dL    Urea Nitrogen 17 8 - 21 mg/dL    Creatinine 4.09 8.11 - 1.02 mg/dL    Protein (Total) 7.7 6.0 - 8.2 g/dL    Albumin 4.4 3.5 - 5.2 g/dL    Bilirubin (Total) 0.5 0.2 - 1.3 mg/dL    Calcium 9.4 8.9 - 91.4 mg/dL    AST (GOT) 23 9 - 38 U/L    Alkaline Phosphatase (Total) 106 38 - 172 U/L    ALT (GPT) 18 7 - 33 U/L    eGFR by CKD-EPI 2021 >60 >59 mL/min/1.73_m2           Time Statement: I spent a total of 20 minutes for the patient's care on the date of the service which includes chart, data review and analysis, consultation, care coordination, and charting.     This note was completed at least in part with voice recognition software.

## 2023-02-17 ENCOUNTER — Encounter (INDEPENDENT_AMBULATORY_CARE_PROVIDER_SITE_OTHER): Payer: Self-pay | Admitting: Student in an Organized Health Care Education/Training Program

## 2023-02-17 ENCOUNTER — Other Ambulatory Visit (INDEPENDENT_AMBULATORY_CARE_PROVIDER_SITE_OTHER): Payer: Self-pay | Admitting: Student in an Organized Health Care Education/Training Program

## 2023-02-17 ENCOUNTER — Ambulatory Visit (INDEPENDENT_AMBULATORY_CARE_PROVIDER_SITE_OTHER): Payer: Medicare Other | Admitting: Student in an Organized Health Care Education/Training Program

## 2023-02-17 VITALS — BP 161/84 | HR 68 | Temp 97.7°F | Resp 12 | Wt 157.0 lb

## 2023-02-17 DIAGNOSIS — F419 Anxiety disorder, unspecified: Secondary | ICD-10-CM

## 2023-02-17 DIAGNOSIS — R160 Hepatomegaly, not elsewhere classified: Secondary | ICD-10-CM

## 2023-02-17 MED ORDER — DIAZEPAM 5 MG PO TABS
5.0000 mg | ORAL_TABLET | Freq: Three times a day (TID) | ORAL | 2 refills | Status: DC | PRN
Start: 2023-02-17 — End: 2023-02-19

## 2023-02-17 NOTE — Progress Notes (Signed)
Subjective:      Patient ID: Amber [name] is a 72 y.o. female.    Chief Complaint:  Chief Complaint   Patient presents with    Hospital Follow-up     The patient was seen at [facility] on 02/12/2023 for high BP and chest pain during her visit the patient was informed that her liver is enlarged and there is a cyst.        HPI:  Here for follow-up from recent ED visit.  ED : [facility]  Date of ED visit: April 12  Presenting Symptoms: chest pain and high BP, thinks it is due to her chronic knee pain  ED course: negative cardiac workup. CT revealed large liver  Chief Complaint: chest pain    Duration of time: x 2 hours    Associated Symptoms: pt PMH HTN and TIA biba from home for constant dull midsternal chest pain that began about 2 hours ago. Initially had nausea, received 4mg  zofran with relief en route by ems along with 0.4mg  nitro SL and 324 ASA with no relief from the pain. . Endorses epigastric abdominal pain, states it feels different than her GERD.      Discharge diagnosis:   Post-ED Course:    She still does have stomach pain. She has a hx of gastric bypass so her stomach gets irritated.   She has knee pain everyday. She uses volteran gel without relief. Tried tenz machine without relief. Needs to see GI for her enlarged liver follow up.    Home care: ADL's intact  Specialist follow-up: GI  Post-ED monitoring: none    Medication Reconciliation(required):  Home medications were reviewed and reconciled with current medication list within electronic medical record. Patient is adherent with current medication regimen.      Problem List:  Patient Active Problem List   Diagnosis    Essential hypertension    Anxiety state    Abnormal electrocardiogram    Cerebral infarction    Gastroesophageal reflux disease    Hypernatremia    Paresthesia    Slow transit constipation    Vitamin D deficiency    Overactive bladder    S/P laparoscopic cholecystectomy    Transient cerebral  ischemia, unspecified type    Palpitations    Pituitary microadenoma    Dyslipidemia    Other long term (current) drug therapy    Headache    History of spinal surgery    Obstructive sleep apnea syndrome    Hiatal hernia    Osteoarthritis of knees, bilateral    S/P gastric bypass    Nutritional deficiency    Anxiety    Epigastric abdominal tenderness without rebound tenderness    Encounter for vitamin deficiency screening    Nausea    Other dysphagia    Small bowel obstruction due to adhesions    Allergic rhinitis    Chronic frontal sinusitis    Chronic sphenoidal sinusitis    Primary osteoarthritis of both knees    Labile hypertension    Postsurgical dumping syndrome    Hypercholesterolemia    History of chronic sinusitis    Mild intermittent asthma without complication    S/P laparoscopy       Current Medications:  Outpatient Medications Marked as Taking for the 02/17/23 encounter (Office Visit) with Edison Simon, DO   Medication Sig Dispense Refill    aspirin EC 81 MG EC tablet Take 1 tablet (81 mg total) by mouth daily. 30 tablet  1    atenolol (TENORMIN) 50 MG tablet Take 1 tablet (50 mg) by mouth daily 90 tablet 3    Cyanocobalamin (B-12 IJ) Inject as directed every 30 (thirty) days         fluticasone (FLONASE) 50 MCG/ACT nasal spray 2 sprays by Nasal route daily. (Patient taking differently: 2 sprays by Nasal route as needed) 16 g 2    gabapentin (NEURONTIN) 300 MG capsule daily as needed      losartan (COZAAR) 50 MG tablet Take 1 tablet (50 mg) by mouth daily 90 tablet 3    Multiple Vitamin (MULTIVITAMIN) capsule Take 1 capsule by mouth daily      ondansetron (ZOFRAN-ODT) 4 MG disintegrating tablet Take 1 tablet (4 mg total) by mouth every 8 (eight) hours as needed for Nausea 30 tablet 2    pantoprazole (PROTONIX) 40 MG tablet TAKE 1 TABLET(40 MG) BY MOUTH DAILY 90 tablet 0    valACYclovir HCL (VALTREX) 500 MG tablet TAKE 1 TABLET(500 MG) BY MOUTH DAILY.  NEED FOLLOW UP APPOINTMENT 90 tablet 0    [DISCONTINUED]  diazePAM (VALIUM) 5 MG tablet Take 1 tablet (5 mg total) by mouth every 8 (eight) hours as needed for Anxiety 30 tablet 2       Allergies:  Allergies   Allergen Reactions    Acyclovir Nausea And Vomiting     Other reaction(s): gi distress    Amlodipine      nausea    Amoxicillin-Pot Clavulanate      Other reaction(s): gi distress      Atorvastatin        Palpitation    Carvedilol      Extreme fatigue    Hydralazine      Headache      Lisinopril      nausea    Lovastatin Nausea And Vomiting     Other reaction(s): gi distress    Metronidazole Nausea And Vomiting     Other reaction(s): gi distress    Moxifloxacin Nausea And Vomiting         GI symptoms    Rosuvastatin      Palpitation, chest pain, abd pain     Statins      Chest pain, palpitations.    Sulfa Antibiotics Nausea And Vomiting     Other reaction(s): gi distress       Past Medical History:  Past Medical History:   Diagnosis Date    Asthma     Bronchitis 01/02/2019    Chest pain 2016     Chest pain after eating x6  months--being followed by GI for GERD    Claustrophobia     MRIs    Constipation     Genital herpes     no current outbreak (10/03/15)    GERD (gastroesophageal reflux disease)     Hiatal hernia     h/o surgery    Hyperlipidemia     Hypertensive disorder     Well controlled on meds per pt. Elevated with pain.    Low back pain     Nausea without vomiting     OSA on CPAP 2015    CPAP nightly x 1 yr, used nightly.    Osteoarthritis of knees, bilateral     and back    SBO (small bowel obstruction) 09/2018    TIA (transient ischemic attack) 2014    TIA 2014-no residual. Patient evaulated for possible TIA 07/12/2015  @ [facility] (dischage summary in epic)-per summary CT  of head was normal and patient was d/c'd       Past Surgical History:  Past Surgical History:   Procedure Laterality Date    APPENDECTOMY (OPEN)  >25 yrs    CHOLECYSTECTOMY      EGD  01/2015    EGD N/A 01/23/2019    Procedure: EGD;  Surgeon: Champ Mungo, MD;  Location: [facility];   Service: General;  Laterality: N/A;  ENDOSCOPY  MD REQ=30MINS; Q1=UNK    EGD, BIOPSY N/A 09/15/2017    Procedure: EGD, BIOPSY;  Surgeon: Pershing Proud, MD;  Location: [facility];  Service: Gastroenterology;  Laterality: N/A;    fallopian tube surgery  >25 yrs    INSERTION, PAIN PUMP (MEDICAL) N/A 10/22/2015    Procedure: INSERTION, PAIN PUMP (MEDICAL);  Surgeon: Nicola Police, DO;  Location: [place] [facility];  Service: General;  Laterality: N/A;    JOINT REPLACEMENT      LAPAROSCOPIC, CHOLECYSTECTOMY, CHOLANGIOGRAM  08/13/2014    LAPAROSCOPIC, GASTRIC BYPASS N/A 10/22/2015    Procedure: LAPAROSCOPIC, GASTRIC BYPASS;  Surgeon: Josefa Half R, DO;  Location: [place] [facility];  Service: General;  Laterality: N/A;    LAPAROSCOPIC, HERNIORRHAPHY, HIATAL N/A 10/22/2015    Procedure: LAPAROSCOPIC, HERNIORRHAPHY, HIATAL;  Surgeon: Nicola Police, DO;  Location: [place] [facility];  Service: General;  Laterality: N/A;    LAPAROSCOPIC, LYSIS, ADHESIONS N/A 09/01/2018    Procedure: LAPAROSCOPIC, LYSIS, ADHESIONS RELEASE OF  SMALL BOWEL OBSTRUCTION;  Surgeon: Champ Mungo, MD;  Location: [place] [facility];  Service: General;  Laterality: N/A;    LAPAROSCOPY, DIAGNOSTIC N/A 09/01/2018    Procedure: LAPAROSCOPY, DIAGNOSTIC;  Surgeon: Champ Mungo, MD;  Location: [place] [facility];  Service: General;  Laterality: N/A;    LAPAROSCOPY, DIAGNOSTIC N/A 07/16/2021    Procedure: LAPAROSCOPY, DIAGNOSTIC;  Surgeon: Nicola Police, DO;  Location:  MAIN OR;  Service: General;  Laterality: N/A;    OVARY SURGERY Right >25 yrs    REPLACEMENT TOTAL KNEE Left     SPINE SURGERY  11/2013    cervical HNP x 2, Dr. Bufford Buttner    TOOTH EXTRACTION N/A        Family History:  Family History   Problem Relation Age of Onset    Cancer Mother 53        breast cancer    Breast cancer Mother     Heart disease Father     Malignant hyperthermia Neg Hx     Pseudochol deficiency Neg Hx        Social History:  Social History      Tobacco Use    Smoking status: Never    Smokeless tobacco: Never   Vaping Use    Vaping status: Never Used   Substance Use Topics    Alcohol use: Yes     Comment: occ.    Drug use: Never          The following sections were reviewed this encounter by the provider:   Tobacco  Allergies  Meds  Problems  Med Hx  Surg Hx  Fam Hx          ROS:  As stated in HPI    Vitals:  BP 161/84 (BP Site: Left arm, Patient Position: Sitting, Cuff Size: Medium)   Pulse 68   Temp 97.7 F (36.5 C) (Temporal)   Resp 12   Wt 71.2 kg (157 lb)   LMP  (LMP Unknown)   BMI  29.66 kg/m      Objective:     Physical Exam:  Physical Exam  Constitutional:       Appearance: Normal appearance.   HENT:      Head: Normocephalic and atraumatic.      Right Ear: External ear normal.      Left Ear: External ear normal.      Nose: Nose normal.      Mouth/Throat:      Pharynx: No posterior oropharyngeal erythema.   Pulmonary:      Effort: Pulmonary effort is normal. No respiratory distress.   Musculoskeletal:         General: Normal range of motion.   Neurological:      General: No focal deficit present.      Mental Status: She is alert.   Psychiatric:         Mood and Affect: Mood normal.              ER:    Radiology  CT Abdomen Pelvis W IV/ WO PO Cont    Result Date: 02/12/2023  IMPRESSION: 1. No acute abnormality in the abdomen/pelvis. 2. Hepatomegaly and hepatic steatosis. 3. Colonic diverticulosis without diverticulitis. 4. Uterine fibroids. Aortic Atherosclerosis (ICD10-I70.0). Electronically Signed   By: Narda Rutherford M.D.   On: 02/12/2023 15:26    CHEST PORTABLE    Result Date: 02/12/2023  IMPRESSION: Stable top-normal heart size. No acute findings. Electronically Signed   By: Irish Lack M.D.   On: 02/12/2023 13:40      Assessment/Plan:     1. Enlarged liver  - Referral to Gastroenterology ([place]); Future        Follow up      PRN    Edison Simon, DO

## 2023-02-17 NOTE — Progress Notes (Signed)
Have you seen any specialists/other providers since your last visit with [place]?    Yes, Cardiologist       The patient was informed that the following HM items are still outstanding:   Health Maintenance Due   Topic Date Due    Tetanus Ten-Year  02/25/2009    Medicare Annual Wellness Visit  03/28/2020    FALLS RISK ANNUAL  06/12/2022    COVID-19 Vaccine (5 - 2023-24 season) 07/03/2022

## 2023-02-18 ENCOUNTER — Encounter (INDEPENDENT_AMBULATORY_CARE_PROVIDER_SITE_OTHER): Payer: Self-pay | Admitting: Student in an Organized Health Care Education/Training Program

## 2023-02-18 DIAGNOSIS — F419 Anxiety disorder, unspecified: Secondary | ICD-10-CM

## 2023-02-23 ENCOUNTER — Telehealth (INDEPENDENT_AMBULATORY_CARE_PROVIDER_SITE_OTHER): Payer: Self-pay | Admitting: Student in an Organized Health Care Education/Training Program

## 2023-02-23 MED ORDER — DIAZEPAM 5 MG PO TABS
5.0000 mg | ORAL_TABLET | Freq: Three times a day (TID) | ORAL | 2 refills | Status: DC | PRN
Start: 2023-02-23 — End: 2023-08-17

## 2023-02-23 NOTE — Telephone Encounter (Signed)
The patient sent a message stating that the medication diazepam (VALIUM) 5 MG tablets were not received by her pharmacy. This was also verified with the pharmacist. The refill was sent by Dr. Welton Flakes on 02/17/2023 but needs to be re-sent.

## 2023-03-06 ENCOUNTER — Other Ambulatory Visit (INDEPENDENT_AMBULATORY_CARE_PROVIDER_SITE_OTHER): Payer: Self-pay | Admitting: Internal Medicine

## 2023-03-06 DIAGNOSIS — K219 Gastro-esophageal reflux disease without esophagitis: Secondary | ICD-10-CM

## 2023-03-08 ENCOUNTER — Encounter (INDEPENDENT_AMBULATORY_CARE_PROVIDER_SITE_OTHER): Payer: Self-pay | Admitting: Internal Medicine

## 2023-03-10 ENCOUNTER — Encounter (INDEPENDENT_AMBULATORY_CARE_PROVIDER_SITE_OTHER): Payer: Self-pay

## 2023-03-11 ENCOUNTER — Encounter (INDEPENDENT_AMBULATORY_CARE_PROVIDER_SITE_OTHER): Payer: Self-pay

## 2023-03-11 ENCOUNTER — Encounter (INDEPENDENT_AMBULATORY_CARE_PROVIDER_SITE_OTHER): Payer: Self-pay | Admitting: Cardiology

## 2023-03-11 ENCOUNTER — Ambulatory Visit (INDEPENDENT_AMBULATORY_CARE_PROVIDER_SITE_OTHER): Payer: Medicare Other | Admitting: Cardiology

## 2023-03-11 VITALS — BP 165/88 | HR 76 | Temp 98.0°F | Ht 61.0 in | Wt 160.0 lb

## 2023-03-11 DIAGNOSIS — I10 Essential (primary) hypertension: Secondary | ICD-10-CM

## 2023-03-11 MED ORDER — LOSARTAN POTASSIUM 50 MG PO TABS
50.0000 mg | ORAL_TABLET | Freq: Two times a day (BID) | ORAL | 3 refills | Status: AC
Start: 2023-03-11 — End: ?

## 2023-03-11 NOTE — Progress Notes (Signed)
Amber [name] 72 y.o. with a history of HTN presents for cardiac follow up of hypertension.     Since her gastric surgery, she has been intolerant to different medications due to GI distress.       Hypertension: Multiple medication intolerances since bariatric surgery.  Currently she is on losartan 50 mg daily and atenolol 50 mg daily.    She was intolerant to the recently prescribed hydrochlorothiazide, clonidine oral and patch, nifedipine ( coughing).   With losartan she had significant nausea.  She also had nausea with amlodipine.    She had nausea with lisinopril in the past     Cerebrovascular disease: History of a TIA.  She has had no recurrence.  This TIA occurred when she was not on aspirin.  Currently compliant with aspirin.      Cardiac work up has included     Echocardiogram (04/04/2020): Normal LV/RV size and systolic function.  Ejection fraction 60%.  Mild left ventricular hypertrophy.  No significant valvular heart disease.  No pericardial effusion.                    - stress test (11/06/17 - normal myocardial perfusion)               - cardiac cath ( 01/08/14 - normal )              - echocardiogram ( EF 55%, mild TR)        Current Outpatient Medications   Medication Sig Dispense Refill    aspirin EC 81 MG EC tablet Take 1 tablet (81 mg total) by mouth daily. 30 tablet 1    atenolol (TENORMIN) 50 MG tablet Take 1 tablet (50 mg) by mouth daily 90 tablet 3    Cyanocobalamin (B-12 IJ) Inject as directed every 30 (thirty) days         diazePAM (VALIUM) 5 MG tablet Take 1 tablet (5 mg) by mouth every 8 (eight) hours as needed for Anxiety 30 tablet 2    famotidine (PEPCID) 20 MG tablet Take 1 tablet (20 mg total) by mouth daily 90 tablet 1    fluticasone (FLONASE) 50 MCG/ACT nasal spray 2 sprays by Nasal route daily. (Patient taking differently: 2 sprays by Nasal route as needed) 16 g 2    gabapentin (NEURONTIN) 300 MG capsule daily as needed      Multiple Vitamin (MULTIVITAMIN) capsule Take 1 capsule by  mouth daily      ondansetron (ZOFRAN-ODT) 4 MG disintegrating tablet Take 1 tablet (4 mg total) by mouth every 8 (eight) hours as needed for Nausea 30 tablet 2    pantoprazole (PROTONIX) 40 MG tablet TAKE 1 TABLET(40 MG) BY MOUTH DAILY 90 tablet 0    valACYclovir HCL (VALTREX) 500 MG tablet TAKE 1 TABLET(500 MG) BY MOUTH DAILY.  NEED FOLLOW UP APPOINTMENT 90 tablet 0    vitamin D, ergocalciferol, (DRISDOL) 50000 UNIT Cap Take 1 capsule (50,000 Units) by mouth once a week 12 capsule 1    losartan (COZAAR) 50 MG tablet Take 1 tablet (50 mg) by mouth 2 (two) times daily 180 tablet 3     No current facility-administered medications for this visit.            PE:    Vitals:    03/11/23 0901   BP: 165/88   Pulse: 76   Temp:    SpO2:      Body mass index is 30.23 kg/m.  Physical Examination: General appearance - alert, well appearing, and in no distress  Mental status - alert, oriented to person, place, and time  Chest - clear to auscultation, no wheezes, rales or rhonchi, symmetric air entry  Heart - normal rate and regular rhythm, S1 and S2 normal  Abdomen - soft, nontender, nondistended, no masses or organomegaly  Extremities - no pedal edema noted        Labs:  Lipid Panel   Cholesterol   Date/Time Value Ref Range Status   01/05/2023 08:08 AM 188 <200 mg/dL Final     Triglycerides   Date/Time Value Ref Range Status   01/05/2023 08:08 AM 59 <150 mg/dL Final   16/08/9603 54:09 AM 39 34 - 149 mg/dL Final     HDL   Date/Time Value Ref Range Status   01/05/2023 08:08 AM 77 > OR = 50 mg/dL Final       CMP:   Sodium   Date/Time Value Ref Range Status   01/05/2023 08:08 AM 142 135 - 146 mmol/L Final     Potassium   Date/Time Value Ref Range Status   01/05/2023 08:08 AM 4.4 3.5 - 5.3 mmol/L Final     Chloride   Date/Time Value Ref Range Status   01/05/2023 08:08 AM 106 98 - 110 mmol/L Final     CO2   Date/Time Value Ref Range Status   01/05/2023 08:08 AM 29 20 - 32 mmol/L Final     Glucose   Date/Time Value Ref Range Status    01/05/2023 08:08 AM 87 65 - 99 mg/dL Final     Comment:                   Fasting reference interval          BUN   Date/Time Value Ref Range Status   01/05/2023 08:08 AM 9 7 - 25 mg/dL Final     Protein, Total   Date/Time Value Ref Range Status   01/05/2023 08:08 AM 6.6 6.1 - 8.1 g/dL Final     Alkaline Phosphatase   Date/Time Value Ref Range Status   01/05/2023 08:08 AM 102 37 - 153 U/L Final     AST (SGOT)   Date/Time Value Ref Range Status   01/05/2023 08:08 AM 21 10 - 35 U/L Final     ALT   Date/Time Value Ref Range Status   01/05/2023 08:08 AM 15 6 - 29 U/L Final     Anion Gap   Date/Time Value Ref Range Status   01/23/2022 09:45 AM 4.0 (L) 5.0 - 15.0 Final     Comment:     Calculated AGAP = Na - (CL + CO2)  Interpret with caution; calculated AGAP may not  reflect patient's true clinical status.  This is a calculated value and platform-dependent.  A value >12.0 has been recommended for the management of  Hyperglycemic Crises: Diabetic Ketoacidosis and Hyperglycemic  Hyperosmolar State. Med Clin [place]. 2017;101(3):587-606.  doi:10.1016/j.mcna.2016.12.011         CBC:   WBC   Date/Time Value Ref Range Status   01/05/2023 08:08 AM 3.6 (L) 3.8 - 10.8 Thousand/uL Final   06/30/2009 04:03 PM 9.12 3.50 - 10.80 /CUMM Final     RBC   Date/Time Value Ref Range Status   01/05/2023 08:08 AM 4.85 3.80 - 5.10 Million/uL Final     Hemoglobin   Date/Time Value Ref Range Status   01/05/2023 08:08 AM 13.8 11.7 - 15.5 g/dL  Final     Hematocrit   Date/Time Value Ref Range Status   01/05/2023 08:08 AM 43.6 35.0 - 45.0 % Final     MCV   Date/Time Value Ref Range Status   01/05/2023 08:08 AM 89.9 80.0 - 100.0 fL Final     MCHC   Date/Time Value Ref Range Status   01/05/2023 08:08 AM 31.7 (L) 32.0 - 36.0 g/dL Final     RDW   Date/Time Value Ref Range Status   01/05/2023 08:08 AM 13.7 11.0 - 15.0 % Final     Platelets   Date/Time Value Ref Range Status   01/05/2023 08:08 AM 261 140 - 400 Thousand/uL Final   08/16/2018 07:34 AM  252 140 - 400 Thousand/uL Final           Impression / plan    Hypertension: Intolerant to many medications since gastric bypass surgery.  She is currently tolerating losartan 50 mg daily and atenolol 50 mg daily.  Blood pressure not adequately controlled at home.  Will increase losartan to 50 mg twice a day with food.  Continue atenolol 50 mg nightly.    Follow-up with cardiology in 6 months      Nelda Bucks, MD  03/11/2023

## 2023-04-01 ENCOUNTER — Encounter (INDEPENDENT_AMBULATORY_CARE_PROVIDER_SITE_OTHER): Payer: Self-pay

## 2023-04-02 ENCOUNTER — Encounter (INDEPENDENT_AMBULATORY_CARE_PROVIDER_SITE_OTHER): Payer: Self-pay | Admitting: Internal Medicine

## 2023-04-05 ENCOUNTER — Encounter (INDEPENDENT_AMBULATORY_CARE_PROVIDER_SITE_OTHER): Payer: Self-pay | Admitting: Internal Medicine

## 2023-04-08 ENCOUNTER — Telehealth (INDEPENDENT_AMBULATORY_CARE_PROVIDER_SITE_OTHER): Payer: Self-pay | Admitting: Cardiology

## 2023-04-08 ENCOUNTER — Encounter (INDEPENDENT_AMBULATORY_CARE_PROVIDER_SITE_OTHER): Payer: Self-pay | Admitting: Internal Medicine

## 2023-04-08 NOTE — Telephone Encounter (Signed)
[facility] is requesting for EKG tracings.    Patient is scheduled for colonoscopy on 6/24    TEL [phone]  FAX [phone]    Thanks.

## 2023-04-10 ENCOUNTER — Encounter (INDEPENDENT_AMBULATORY_CARE_PROVIDER_SITE_OTHER): Payer: Self-pay

## 2023-04-11 ENCOUNTER — Encounter (INDEPENDENT_AMBULATORY_CARE_PROVIDER_SITE_OTHER): Payer: Self-pay

## 2023-04-12 ENCOUNTER — Encounter (INDEPENDENT_AMBULATORY_CARE_PROVIDER_SITE_OTHER): Payer: Self-pay | Admitting: Internal Medicine

## 2023-04-12 ENCOUNTER — Ambulatory Visit (INDEPENDENT_AMBULATORY_CARE_PROVIDER_SITE_OTHER): Payer: Medicare Other | Admitting: Internal Medicine

## 2023-04-12 VITALS — BP 172/89 | HR 72 | Temp 98.3°F | Wt 155.0 lb

## 2023-04-12 DIAGNOSIS — K760 Fatty (change of) liver, not elsewhere classified: Secondary | ICD-10-CM

## 2023-04-12 DIAGNOSIS — I10 Essential (primary) hypertension: Secondary | ICD-10-CM

## 2023-04-12 DIAGNOSIS — G8929 Other chronic pain: Secondary | ICD-10-CM

## 2023-04-12 DIAGNOSIS — M25562 Pain in left knee: Secondary | ICD-10-CM

## 2023-04-12 DIAGNOSIS — K219 Gastro-esophageal reflux disease without esophagitis: Secondary | ICD-10-CM

## 2023-04-12 MED ORDER — ATENOLOL 100 MG PO TABS
100.0000 mg | ORAL_TABLET | Freq: Every day | ORAL | 1 refills | Status: DC
Start: 2023-04-12 — End: 2023-08-17

## 2023-04-12 NOTE — Progress Notes (Signed)
Have you seen any specialists/other providers since your last visit with [place]?    No    Health Maintenance Due   Topic Date Due    Tetanus Ten-Year  02/25/2009    Pneumonia Vaccine Age 71+ (2 of 2 - PPSV23 or PCV20) 01/23/2019    Medicare Annual Wellness Visit  03/28/2020    COVID-19 Vaccine (5 - 2023-24 season) 07/03/2022

## 2023-04-12 NOTE — Progress Notes (Signed)
Subjective:      Patient ID: Amber [name] is a 72 y.o. female     Chief Complaint   Patient presents with    Hypertension     Med follow up, pt is not fasting     Abdominal Pain     Dizziness, diarrhea, possible rx side effects        HPI Pt is here for follow up following problems:     HTN, home BP fluctuating widely,  Saw card, rx'd clonidine, unable to tolerate med,     Left knee chronic pain to start PT,   Sees ortho, Dr. Janice [name],   Takes gabapentin one cap daily, rx'd via pain management    GERD, sx stable, no dysphagia    She has some upper abd pain discomfort, occ diarrhea, she attributed this to losartan,   CT abd neg. +fatty liver.   She is scheduled for EGD in near future and f/u with GI       The following sections were reviewed this encounter by the provider:   Tobacco  Allergies  Meds  Problems  Med Hx  Surg Hx  Fam Hx         Review of Systems   Constitutional:  Negative for appetite change and unexpected weight change.   Respiratory:  Negative for shortness of breath.    Cardiovascular:  Negative for chest pain and palpitations.   Gastrointestinal:  Positive for abdominal pain. Negative for blood in stool, nausea and vomiting.   Genitourinary:  Negative for dysuria.   Musculoskeletal:  Positive for arthralgias. Negative for myalgias.   Neurological:  Negative for syncope, light-headedness and headaches.          BP 172/89 (BP Site: Left arm, Patient Position: Sitting)   Pulse 72   Temp 98.3 F (36.8 C) (Temporal)   Wt 70.3 kg (155 lb)   LMP  (LMP Unknown)   BMI 29.29 kg/m     Objective:     Physical Exam  Vitals reviewed.   Constitutional:       General: She is not in acute distress.     Appearance: She is well-developed.   Eyes:      General: No scleral icterus.     Conjunctiva/sclera: Conjunctivae normal.   Neck:      Thyroid: No thyromegaly.      Vascular: No carotid bruit or JVD.   Cardiovascular:      Rate and Rhythm: Normal rate and regular rhythm.      Heart sounds: Normal  heart sounds. No murmur heard.     No friction rub. No gallop.   Pulmonary:      Effort: Pulmonary effort is normal. No respiratory distress.      Breath sounds: Normal breath sounds. No rales.   Abdominal:      General: Bowel sounds are normal. There is no distension.      Palpations: Abdomen is soft.      Tenderness: There is abdominal tenderness. There is no right CVA tenderness, left CVA tenderness, guarding or rebound.   Musculoskeletal:         General: Tenderness present. No swelling.      Cervical back: Neck supple.      Right lower leg: No edema.      Left lower leg: No edema.      Comments: +crepitus left knee,    Neurological:      Mental Status: She is alert.  Assessment:     1. Chronic pain of left knee    2. Gastroesophageal reflux disease, unspecified whether esophagitis present    3. Fatty liver    4. Essential hypertension  - atenolol (TENORMIN) 100 MG tablet; Take 1 tablet (100 mg) by mouth daily  Dispense: 90 tablet; Refill: 1        Plan:     Increase atenolol to 100mg  daily  Continue meds  Monitor home BP  Keep f/u with specialists as directed  Weight reduction   F/u 2 months.     Excell Seltzer, MD

## 2023-04-13 ENCOUNTER — Encounter (INDEPENDENT_AMBULATORY_CARE_PROVIDER_SITE_OTHER): Payer: Self-pay

## 2023-04-20 ENCOUNTER — Encounter (HOSPITAL_BASED_OUTPATIENT_CLINIC_OR_DEPARTMENT_OTHER): Payer: Self-pay | Admitting: Hematology & Oncology

## 2023-04-28 NOTE — Consults (Signed)
 Cardiology Consult        DATE OF  ADMISSION:04/27/2023 11:40 AM  DATE OF CONSULT: 04/28/2023 9:51 AM  [id number] MRN  Amber [name]    Reason for Consultation:cp   Patient discussed with Dr. Dorothea [name] Problems    Diagnosis    . Chest pain, unspecified type [R07.9]          Assessment and Plan:     Chest pain   Follows [facility] cardiology ( DR. Roslynn [name])   Serial troponin negative X 4 ( Trop of 8, 8, 13, 11)   EKG nonischemic   Nuclear stress test 05/2021 normal   Cath on 01/08/14 normal. Echo on 04/04/20 EF 60%   Continue with ASA, statin and BB, losartan , HCTZ, clonidine . Titrate as BP tolerates  Currently have some chest tightness, may be related to uncontrolled HTN  Will check echo, consider outpatient stress test pending echo results and BP control, improvement in symptoms    HTN   TIA   Anxiety       History of Present Illness:     Amber [name] is a 72 y.o. female who is being admitted for chest pain. She was getting ready to to go physical therapy when she started having chest pain.  Described it as tightness. Associated symptoms includes dizziness and nausea.  The chest pain radiated to the left neck side.         Family Hx:   Family History   Problem Relation Age of Onset   . Cancer Mother    . Heart Failure Father    . Diabetes Brother    . Seizures Brother    . Cancer Maternal Aunt    . Diabetes 1st Cousin    . Anesthesia Reaction Neg Hx          Social Hx:    Social History     Socioeconomic History   . Marital status: Single     Spouse name: Not on file   . Number of children: Not on file   . Years of education: Not on file   . Highest education level: Not on file   Occupational History   . Not on file   Tobacco Use   . Smoking status: Never   . Smokeless tobacco: Never   Vaping Use   . Vaping status: Never Used   Substance and Sexual Activity   . Alcohol use: No   . Drug use: No   . Sexual activity: Not on file   Other Topics Concern   . Not on file   Social History Narrative   . Not  on file     Social Determinants of Health     Financial Resource Strain: Low Risk  (01/06/2023)    Received from [facility] and [place]  [facility]    Overall Financial Resource Strain (CARDIA)    . Difficulty of Paying Living Expenses: Not hard at all   Food Insecurity: No Food Insecurity (01/06/2023)    Received from [facility] and [place]  [facility]    Hunger Vital Sign    . Worried About Programme researcher, broadcasting/film/video in the Last Year: Never true    . Ran Out of Food in the Last Year: Never true   Transportation Needs: No Transportation Needs (01/06/2023)    Received from [facility] and [place]  [facility]    PRAPARE - Transportation    . Lack of  Transportation (Medical): No    . Lack of Transportation (Non-Medical): No   Physical Activity: Inactive (01/06/2023)    Received from [facility] and [place]  [facility]    Exercise Vital Sign    . Days of Exercise per Week: 0 days    . Minutes of Exercise per Session: 0 min   Stress: No Stress Concern Present (01/06/2023)    Received from [facility] and [place]  [facility]    [employer] of Occupational Health - Occupational Stress Questionnaire    . Feeling of Stress : Not at all   Social Connections: Moderately Isolated (01/06/2023)    Received from [facility] and [place]  [facility]    Social Connection and Isolation Panel [NHANES]    . Frequency of Communication with Friends and Family: More than three times a week    . Frequency of Social Gatherings with Friends and Family: More than three times a week    . Attends Religious Services: More than 4 times per year    . Active Member of Clubs or Organizations: No    . Attends Banker Meetings: Never    . Marital Status: Never married   Intimate Partner Violence: Not At Risk (01/06/2023)    Received from [facility] and [place]  [facility]    Humiliation, Afraid, Rape, and Kick questionnaire    . Fear of Current or Ex-Partner: No    . Emotionally Abused: No    . Physically Abused: No    . Sexually  Abused: No   Housing Stability: Low Risk  (12/24/2022)    Received from [facility] and [place]  [facility]    Housing Stability Vital Sign    . Unable to Pay for Housing in the Last Year: No    . Number of Places Lived in the Last Year: 1    . In the last 12 months, was there a time when you did not have a steady place to sleep or slept in a shelter (including now)?: No       Cardiac Data Review    Results for orders placed or performed during the hospital encounter of 07/08/19   Echo Cardiogram Complete   Result Value Ref Range    EF Echo 60     Narrative     ECHOCARDIOGRAPHIC REPORT     Name: Amber [name] Gender: F Referring Physician: Gregor [name]  ; [facility]   Exam Date: 2019-07-08 15:45 Location: [facility] Sonographer: Amber [name]   CPI: [id number]   DOB: [birth year] BP: 136 / 68 Ht: 61 Wt: 145 BSA: 1.68   Type of Exam: ECHO CARDIOGRAM COMPLETE   Procedure: 2-D echocardiogram,Color flow analysis, Spectral Doppler analysis   ____________________________________________________________________________________________________   __       Impression    :   COMPARED TO PREVIOUS ECHO PERFORMED ON 10/09/2016.   EJECTION FRACTION IS 60%.   STAGE I DIASTOLIC DYSFUNCTION.   THE LEFT ATRIUM IS DILATED  IN SIZE.       Clinical Indications: Chest pain   ICD Codes: Technical Quality: Fair     MEASUREMENTS:  (Female / Female) Normal Values   2D ECHO    RV Diameter                       3.2 cm                2.0-3.0   RV Diastolic Basal Diameter  3.1 cm                2.5-4.1   LV Diastolic Diameter Base LX     5 cm                  4.2-5.8 / 3.8-5.2   LV Systolic Diameter Base LX      2.9 cm                2.5-4.0 / 2.2-3.5   IVS Diastolic Thickness           0.92 cm               0.6-1.0 / 0.6-0.9   LVPW Diastolic Thickness          1.3 cm                0.6-1.0 / 0.6-0.9   LA Systolic Diameter LX           3.8 cm                2.1-3.7 cm   Aortic Root Diameter              2.8 cm                2.0-3.5 cm   RA  Systolic Pressure              5 mmHg   LA Systolic Pressure              13.2 mmHg   Ascending Aorta Diameter          2.6 cm   LA Area 4C View                   16.7 cm   LA Length 4C                      4.9 cm   LA Volume                         45.6 cm   LVOT Stroke Volume                74 cm   MV Peak Gradient                  6 mmHg   MV Area PHT                       4 cm   PV Peak Gradient                  3 mmHg   MV Mean Gradient                  2 mmHg       DOPPLER    AV Peak Velocity                  177 cm/s   AV Peak Gradient                  12.5 mmHg   AV Mean Gradient                  6 mmHg   AV Velocity Time Integral         38 cm   LVOT Peak Velocity  121 cm/s   LVOT Peak Gradient                5.9 mmHg   LVOT Velocity Time Integral       26.2 cm   LVOT Diameter                     1.9 cm   LVOT Area                         2.8 cm   AV Area Cont Eq pk                1.9 cm   Mitral E Point Velocity           87.8 cm/s   Mitral  A Point Velocity          99 cm/s   Mitral E to A Ratio               0.89                  1.0 to 1.5   Mitral E to LV E' Septal Ratio    11.7   Mitral E to LV E' Lateral Ratio   9.1   MV Peak Velocity                  118 cm/s   MV Peak Gradient                  5.6 mmHg   MV Mean Velocity                  68.4 cm/s   MV Mean Gradient                  2 mmHg   MV Deceleration Time              187 ms                160-240 ms   MV Area PHT                       4 cm   E Prime Velocity                  9.7 cm/s   PV Peak Velocity                  93 cm/s   PV Peak Gradient                  3.5 mmHg   TR Peak Velocity                  2.4 m/s   TR Peak Gradient                  23.5 mmHg   RV Systolic Pressure              29 mmHg       FINDINGS:     Left Ventricle: Left ventricular size, thickness and systolic function are normal, no wall motion abnormalities.   Left Ventricle The left ventricular systolic function is normal. Ejection fraction is  60%.  Stage I diastolic   Function: dysfunction.     LVEF: 60 %       Left Atrium: The left atrium is dilated  in size.   Right Ventricle:  The right ventricle is normal in size. Normal right ventricular function.  TAPSE:2.3cm.   Right Atrium: The right atrium is normal in size. Normal inferior vena cava size and collapse.   Mitral Valve: The mitral valve leaflets are sclerotic . No regurgitation or stenosis.   Aortic Valve: The aortic valve leaflets are sclerotic . No regurgitation or stenosis.   Tricuspid Valve: The tricuspid valve is structurally normal. Mild  regurgitation .No  stenosis. RVSP:64mmHg.   Pulmonic Valve: The pulmonic valve is structurally normal. No regurgitation or stenosis.   Aorta: The aorta is normal.   Pericardium: No pericardial effusion.   Masses / Shunts: No masses, shunts or thrombi seen.                     Amber Learned MD   (Electronically Signed)   Final Date: 09 July 2019 15:04   Results for orders placed or performed during the hospital encounter of 06/28/14   PHARMACOLOGIC STRESS TEST ECHO (DOBUTAMINE)    Narrative     Pharmacologic Stress Echocardiogram Report         Name: ISLAY, FREDA Date: 2014-06-29 08:24 MRN#: [id number]   DOB: [birth year] Location: [facility] Sonographer Dru Georges     Age: 54 Ordering MD: Michaell Adolph ; NP/PA Testing Hayward Lis, PA   ARMADA, MANUEL A Supervisor:  :       Sex: F Stress Personnel: Emmie Harness, RN     Height: 61 Weight 212 Contrast: None used Total dose: N/A   BSA:     Indication: Chest pain-acute,AND (ECG w/o ischemia/LBBB/paced) AND nl/equivocal   troponin     CONCLUSION:   BLOOD PRESSURE RESPONSE WAS HYPERTENSIVE.   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   NEGATIVE DOBUTAMINE STRESS ECHO FOR ISCHEMIA.       Cardiac Medications: HCTZ, Atenolol    Medication Allergies: Statins, Sulfa, Aspirin , Augmentin     Cardiac History/Risk Factors: Hypertension, Elevated lipids, Asthma   Stress Protocol      Protocol: Dobutamine/Atropine     Resting heart rate: 65   Resting BP: 130 / 78     Peak HR: 146 91.8 % max pred     Peak stress BP: 123 / 65 Double Product: 10272     Total Duration: 1 min 0 s Stage Achieved:   Stress  Symptoms: Tingling of the head     Medication Dose with Stress: Dobutamine, dose ___50___ g/kg/min, Atropine, dose __0.5___ mg.     Blood Pressure Response to Stress / Exercise: Hypertensive     Reason for ending test: Target heart rate achieved     Other Testing Comments: LVOT obstruction developed when we got close to peak heart rate. Peak velocity of 4.2m/s   yielding a gradient of .       EKG Analysis     Resting EKG:   Normal sinus rhythm     Exercise EKG:     No ST segment changes.   Arrhythmias:   None   EKG Interpretation:   Negative EKG response.   Echocardiograpic Analysis   Image Quality: excellent         Wall Segments Baseline Low Dose Peak Dose   Basal   Anteroseptal Normal Increased Hyperdynamic   Anterior Normal Increased Hyperdynamic   Anterolateral Normal Increased Hyperdynamic   Inferolateral Normal Increased Hyperdynamic   Inferior Normal Increased Hyperdynamic   Inferoseptal Normal Increased Hyperdynamic     Mid   Anteroseptal Normal Increased Hyperdynamic  Anterior Normal Increased Hyperdynamic   Anterolateral Normal Increased Hyperdynamic   Inferolateral Normal Increased Hyperdynamic   Inferior Normal Increased Hyperdynamic   Inferoseptal Normal Increased Hyperdynamic     Apical   Anterior Normal Increased Hyperdynamic   Lateral Normal Increased Hyperdynamic   Inferior Normal Increased Hyperdynamic   Septal Normal Increased Hyperdynamic     Cap Normal Increased Hyperdynamic     LV Chamber Size Normal Normal Smaller     Global Systolic Function Normal Normal Hyperdynamic                 Dr. Vincent Greek MD   (Electronically Signed)   Final Date: 29 June 2014 09:39       EKG Results: SR  Telemetry Results: SR  CXR:     Active Hospital Problems    Diagnosis    . Chest  pain, unspecified type [R07.9]      Past Medical History:   Diagnosis Date   . Acid reflux    . Arthropathy     KNEES   . Cerebral artery occlusion with cerebral infarction (HCC)     TIA.....2 LAST YEAR   . Decreased exercise tolerance    . Esophageal hiatal hernia    . Esophageal reflux    . GERD (gastroesophageal reflux disease)    . Hiatal hernia    . Hypertension    . Sleep apnea     uses cpap   . TIA (transient ischemic attack)         Allergies   Allergen Reactions   . Acyclovir gi distress   . Amoxicillin-Pot Clavulanate gi distress   . Asa Buff (Mag Carb-Al Glyc) [Aspirin , Buffered] gi distress     Upsets stomach   . Aspirin  gi distress     Patient states "it upsets my stomach"   . Atorvastatin other/intolerance     Palpitation   . Lovastatin gi distress   . Metronidazole gi distress and neurological reaction   . Moxifloxacin gi distress     GI symptoms   . Statins-Hmg-Coa Reductase Inhibitors gi distress and unknown     Palpitations, chest pain, abd pain    . Sulfa (Sulfonamide Antibiotics) gi distress        Medications Prior to Admission   Medication Sig Dispense Refill Last Dose   . aspirin  EC 81 mg PO TBEC Take 1 Tab by Mouth Once a Day.   04/27/2023   . atenoloL  (TENORMIN ) 50 mg PO TABS Take 2 Tabs by Mouth Once a Day. Indications: 0700 & 1900   04/27/2023   . cloNIDine  (CATAPRES ) 0.2 mg/24 hr TD PTWK Apply 1 Patch as directed Every 7 Days. 12 Patch 0    . hydroCHLOROthiazide  (HYDRODIURIL ) 12.5 mg PO CAPS Take 1 Cap by Mouth Once a Day. 30 Cap 0    . losartan  (COZAAR ) 100 mg PO TABS tablet Take 1 Tab by Mouth Once a Day. 30 Tab 0 04/27/2023   . pantoprazole  (PROTONIX ) 40 mg PO TBEC Take 1 Tab by Mouth Once a Day. 30 Tab 1 04/27/2023   . therapeutic multivitamin PO TABS Take 1 Tab by Mouth Once a Day.   04/27/2023       Current Facility-Administered Medications   Medication Dose Route Frequency Provider Last Rate Last Admin   . acetaminophen  (TylenoL ) tablet 650 mg  650 mg Oral Q4H PRN Goudarzi, Behnam M, MD        . acetaminophen  (TylenoL ) tablet 650 mg  650  mg Oral Q4H PRN Goudarzi, Behnam M, MD       . acetaminophen  (TylenoL ) tablet 650 mg  650 mg Oral Q4H PRN Goudarzi, Behnam M, MD       . aspirin  EC tablet 81 mg  81 mg Oral Daily Goudarzi, Behnam M, MD   81 mg at 04/28/23 0906   . atenoloL  (Tenormin ) tablet 50 mg  50 mg Oral BID Goudarzi, Behnam M, MD   50 mg at 04/28/23 1610   . cloNIDine  (Catapres ) 0.2 mg/24 hr 1 Patch  1 Patch Transdermal Every 7 Days Goudarzi, Behnam M, MD       . Definity - Echocardiographic Ultrasound Enhancement Agent-PRN-To Be Given According To Cardiology Protocol  1-10 mL Intravenous PRN Abebe, Tinbit S, NP       . heparin  injection 5,000 Units  5,000 Units Subcutaneous Q8H Goudarzi, Behnam M, MD   5,000 Units at 04/28/23 9604   . hydroCHLOROthiazide  (HydrodiuriL ) capsule 12.5 mg  12.5 mg Oral Daily Goudarzi, Behnam M, MD   12.5 mg at 04/28/23 5409   . losartan  (Cozaar ) tablet 100 mg  100 mg Oral Daily Goudarzi, Behnam M, MD   100 mg at 04/28/23 0906   . magnesium  oxide (Mag-Ox) tablet 400-800 mg  400-800 mg Oral PRN Goudarzi, Behnam M, MD       . magnesium  sulfate 1g/D5W 100ml ivpb 1 g  1 g IVPB PRN Goudarzi, Behnam M, MD       . magnesium  sulfate 2g/50ml (4%) in water IVPB 2 g  2 g IVPB PRN Goudarzi, Behnam M, MD       . NS Flush injection 10 mL  10 mL Intravenous PRN Mason Sole, Radha, DO       . NS Flush injection 10 mL  10 mL Intravenous PRN Goudarzi, Behnam M, MD       . NS Flush injection 10 mL  10 mL Intravenous PRN Goudarzi, Behnam M, MD       . omeprazole  (PriLOSEC) capsule 40 mg  40 mg Oral Daily Goudarzi, Behnam M, MD   40 mg at 04/28/23 0908   . oxyCODONE  (Roxicodone ) tablet 5 mg  5 mg Oral Q4H PRN Severiano Danish, MD   5 mg at 04/27/23 2001   . potassium chloride  (Klor-Con ) packet 20-60 mEq  20-60 mEq Oral PRN Severiano Danish, MD       . potassium chloride  ER (K-Dur;Klor-Con  M) tablet 20-60 mEq  20-60 mEq Oral PRN Goudarzi, Behnam M, MD       . potassium chloride  ER  (K-Dur;Klor-Con  M) tablet 20-60 mEq  20-60 mEq Oral PRN Severiano Danish, MD       . potassium chloride  in water (KCL) 10 mEq/100 mL IVPB 10 mEq  10 mEq Intravenous PRN Severiano Danish, MD       . potassium chloride  in water 20 mEq/100 mL infusion 20 mEq  20 mEq Intravenous via Central Line PRN Severiano Danish, MD       . potassium chloride  in water 20 mEq/50 mL infusion 20 mEq  20 mEq Intravenous via Central Line PRN Severiano Danish, MD           chest X-ray        Review of Systems    Constitutional:  No weight loss, fever, or night sweats  HEENT:  No vertigo, visual loss, or diplopia  Cardiovascular:   chest pain, shortness of breath, no PND and orthopnea  Respiratory:  No cough,  sputum, asthma, or wheezing  Gastrointestinal:  No nausea, heartburn, abdominal pain, constipation or diarrhea  Genitourinary:  No dysuria, frequency, or urgency  Musculoskeletal:  No back pain, muscular weakness or joint swelling  Endocrine:   No cold intolerance, heat intolerance, or polyuria  Neurological:  No syncope, seizures, numbness or tingling of hands, numbness or tingling of feet, unsteady gait or speech difficulties  Skin:  edema, color change or chronic ulcers  Heme: No bruising, no petechiae, no bleeding gums  Psych: Normal affect, normal mood, no suicidal ideation    Objective:   BP 179/82   Pulse 56   Temp 98.3 F (36.8 C)   Resp 18   Ht 5\' 1"  (1.549 m)   Wt 68.9 kg (152 lb)   LMP  (LMP Unknown)   SpO2 98%   BMI 28.72 kg/m     General Appearance:    Alert, cooperative, no distress, appears stated age   Head:    Normocephalic, atraumatic   Eyes:    PERRL, conjunctiva/corneas clear, sclera anicteric   Nose:   Nares normal, septum midline, mucosa normal, no drainage    or sinus tenderness   Throat:   Lips, mucosa, and tongue normal; Oropharynx clear, no exudate   Neck:   Supple, symmetrical, trachea midline, no adenopathy;        thyroid :  No enlargement/tenderness/nodules;  Carotids 2+ Bilaterally, no  JVD   Lungs:     Clear to auscultation bilaterally, respirations unlabored   Chest wall:    No tenderness or deformity   Heart:    Regular rate and rhythm, S1 and S2 normal, no murmur, no rub or gallop   Abdomen:     Soft, non-tender, bowel sounds active all four quadrants,     no masses, no organomegaly   Extremities:   Extremities warm, atraumatic, no clubbing, cyanosis or  edema   Pulses:   2+ and symmetric all extremities, including radial and DP   Skin:   Skin color, texture, turgor normal, no rashes or lesions   Neurologic:   Grossly normal. No focal deficits.       Labs:   Lab Results   Component Value Date    WBC 5.2 04/27/2023    HCT 43.5 04/27/2023    MCV 88 04/27/2023     Lab Results   Component Value Date    CKMB 1.1 07/07/2018     No components found for: "CKTOTAL"  Lab Results   Component Value Date    BUN 11 04/28/2023    NA 141 04/28/2023    CO2 24 04/28/2023         Tinbit S Abebe, NP   04/28/2023, 9:51 AM

## 2023-04-29 ENCOUNTER — Encounter (INDEPENDENT_AMBULATORY_CARE_PROVIDER_SITE_OTHER): Payer: Self-pay | Admitting: Internal Medicine

## 2023-04-30 ENCOUNTER — Encounter (INDEPENDENT_AMBULATORY_CARE_PROVIDER_SITE_OTHER): Payer: Self-pay | Admitting: Internal Medicine

## 2023-05-10 ENCOUNTER — Encounter (INDEPENDENT_AMBULATORY_CARE_PROVIDER_SITE_OTHER): Payer: Self-pay

## 2023-05-10 ENCOUNTER — Ambulatory Visit (INDEPENDENT_AMBULATORY_CARE_PROVIDER_SITE_OTHER): Payer: Medicare Other | Admitting: Cardiovascular Disease

## 2023-05-11 ENCOUNTER — Encounter (INDEPENDENT_AMBULATORY_CARE_PROVIDER_SITE_OTHER): Payer: Self-pay

## 2023-05-12 ENCOUNTER — Ambulatory Visit (INDEPENDENT_AMBULATORY_CARE_PROVIDER_SITE_OTHER): Payer: Medicare Other | Admitting: Internal Medicine

## 2023-05-12 ENCOUNTER — Encounter (INDEPENDENT_AMBULATORY_CARE_PROVIDER_SITE_OTHER): Payer: Self-pay | Admitting: Internal Medicine

## 2023-05-12 VITALS — BP 129/80 | HR 68 | Temp 97.9°F | Wt 154.0 lb

## 2023-05-12 DIAGNOSIS — E785 Hyperlipidemia, unspecified: Secondary | ICD-10-CM

## 2023-05-12 DIAGNOSIS — R0989 Other specified symptoms and signs involving the circulatory and respiratory systems: Secondary | ICD-10-CM

## 2023-05-12 DIAGNOSIS — I10 Essential (primary) hypertension: Secondary | ICD-10-CM

## 2023-05-12 MED ORDER — HYDROCHLOROTHIAZIDE 12.5 MG PO TABS
12.5000 mg | ORAL_TABLET | Freq: Every day | ORAL | 1 refills | Status: DC
Start: 2023-05-12 — End: 2023-08-17

## 2023-05-12 NOTE — Progress Notes (Signed)
Have you seen any specialists/other providers since your last visit with [place]?    Yes, [facility] 04/27/23-04/29/23.    Health Maintenance Due   Topic Date Due    Tetanus Ten-Year  02/25/2009    Pneumonia Vaccine Age 70+ (2 of 2 - PPSV23 or PCV20) 01/23/2019    Medicare Annual Wellness Visit  03/28/2020    COVID-19 Vaccine (5 - 2023-24 season) 07/03/2022

## 2023-05-12 NOTE — Progress Notes (Signed)
Subjective:      Patient ID: Amber [name] is a 72 y.o. female     Chief Complaint   Patient presents with    Hospital Follow-up     [facility] 6/25-6/27        HPI Pt is here for follow up following problems:      Recent hosp adm at [facility] for elevated BP, CP  Echo normal, labs, EKG normal  CTA chest, abd/pelvis  +aortic atherosclerosis,     Lab Results   Component Value Date    CHOL 188 01/05/2023    CHOL 209 (H) 07/11/2022    CHOL 185 01/23/2022     Lab Results   Component Value Date    HDL 77 01/05/2023    HDL 88 07/11/2022    HDL 62 01/23/2022     Lab Results   Component Value Date    LDL 97 01/05/2023    LDL 105 (H) 07/11/2022    LDL 115 (H) 01/23/2022     Lab Results   Component Value Date    TRIG 59 01/05/2023    TRIG 74 07/11/2022    TRIG 39 01/23/2022      HTN, added back HCTZ, tolerating med so far ok,   Previously had palp, dizziness with med.   Home BP has been normal since discharge from hospital      The following sections were reviewed this encounter by the provider:   Allergies  Problems         Review of Systems   Constitutional:  Negative for appetite change and unexpected weight change.   Respiratory:  Negative for shortness of breath.    Cardiovascular:  Negative for chest pain and palpitations.   Gastrointestinal:  Negative for abdominal pain, nausea and vomiting.   Genitourinary:  Negative for dysuria.   Musculoskeletal:  Negative for myalgias.   Neurological:  Negative for syncope, light-headedness and headaches.          BP 129/80 (BP Site: Left arm, Patient Position: Sitting)   Pulse 68   Temp 97.9 F (36.6 C) (Oral)   Wt 69.9 kg (154 lb)   LMP  (LMP Unknown)   BMI 29.10 kg/m     Objective:     Physical Exam  Vitals reviewed.   Constitutional:       General: She is not in acute distress.     Appearance: She is well-developed.   Eyes:      General: No scleral icterus.     Conjunctiva/sclera: Conjunctivae normal.   Neck:      Thyroid: No thyromegaly.      Vascular: No carotid bruit  or JVD.   Cardiovascular:      Rate and Rhythm: Normal rate and regular rhythm.      Heart sounds: Normal heart sounds. No murmur heard.     No friction rub. No gallop.   Pulmonary:      Effort: Pulmonary effort is normal. No respiratory distress.      Breath sounds: Normal breath sounds. No rales.   Abdominal:      General: Bowel sounds are normal. There is no distension.      Palpations: Abdomen is soft.      Tenderness: There is no abdominal tenderness. There is no guarding or rebound.   Musculoskeletal:      Cervical back: Neck supple.      Right lower leg: No edema.      Left lower leg: No edema.  Neurological:      Mental Status: She is alert.          Assessment:     1. Essential hypertension  - hydroCHLOROthiazide 12.5 MG tablet; Take 1 tablet (12.5 mg) by mouth daily  Dispense: 90 tablet; Refill: 1  - Basic Metabolic Panel; Future  - Basic Metabolic Panel    2. Labile hypertension  - hydroCHLOROthiazide 12.5 MG tablet; Take 1 tablet (12.5 mg) by mouth daily  Dispense: 90 tablet; Refill: 1  - Basic Metabolic Panel; Future  - Basic Metabolic Panel    3. Hyperlipidemia, unspecified hyperlipidemia type        Plan:     Rx refills. Continue meds  Monitor home BP  Keep hydrated  Low carb, low chol, low calorie diet, weight control  Further recommendations pending test results.  F/u 3 months.     Excell Seltzer, MD

## 2023-05-13 LAB — BASIC METABOLIC PANEL
Anion Gap: 11 (ref 5.0–15.0)
BUN: 11 mg/dL (ref 7–21)
CO2: 26 mEq/L (ref 17–29)
Calcium: 9.1 mg/dL (ref 7.9–10.2)
Chloride: 104 mEq/L (ref 99–111)
Creatinine: 0.8 mg/dL (ref 0.4–1.0)
GFR: >60 mL/min/{1.73_m2} (ref >=60.0)
Glucose: 93 mg/dL (ref 70–100)
Hemolysis Index: 4 Index
Potassium: 4.1 mEq/L (ref 3.5–5.3)
Sodium: 141 mEq/L (ref 135–145)

## 2023-05-13 NOTE — Progress Notes (Signed)
Blood test is normal    Rec:  1. Continue meds  2. Keep follow up visit

## 2023-05-17 ENCOUNTER — Encounter (HOSPITAL_BASED_OUTPATIENT_CLINIC_OR_DEPARTMENT_OTHER): Payer: Self-pay

## 2023-06-02 ENCOUNTER — Other Ambulatory Visit (INDEPENDENT_AMBULATORY_CARE_PROVIDER_SITE_OTHER): Payer: Self-pay | Admitting: Internal Medicine

## 2023-06-02 DIAGNOSIS — K219 Gastro-esophageal reflux disease without esophagitis: Secondary | ICD-10-CM

## 2023-06-10 ENCOUNTER — Encounter (INDEPENDENT_AMBULATORY_CARE_PROVIDER_SITE_OTHER): Payer: Self-pay

## 2023-06-11 ENCOUNTER — Encounter (INDEPENDENT_AMBULATORY_CARE_PROVIDER_SITE_OTHER): Payer: Self-pay

## 2023-06-14 ENCOUNTER — Other Ambulatory Visit (HOSPITAL_BASED_OUTPATIENT_CLINIC_OR_DEPARTMENT_OTHER): Payer: Self-pay | Admitting: Gastroenterology

## 2023-06-14 DIAGNOSIS — Z1501 Genetic susceptibility to malignant neoplasm of breast: Secondary | ICD-10-CM

## 2023-06-14 DIAGNOSIS — Z800 Family history of malignant neoplasm of digestive organs: Secondary | ICD-10-CM

## 2023-06-14 DIAGNOSIS — K862 Cyst of pancreas: Secondary | ICD-10-CM

## 2023-06-17 ENCOUNTER — Ambulatory Visit (INDEPENDENT_AMBULATORY_CARE_PROVIDER_SITE_OTHER): Payer: Medicare Other | Admitting: Internal Medicine

## 2023-06-22 ENCOUNTER — Ambulatory Visit (HOSPITAL_COMMUNITY): Payer: Medicare PPO | Admitting: Anesthesiology

## 2023-06-22 ENCOUNTER — Ambulatory Visit
Admission: RE | Admit: 2023-06-22 | Discharge: 2023-06-22 | Disposition: A | Discharge status: Home / Self Care | Payer: Medicare PPO | Attending: Gastroenterology | Admitting: Gastroenterology

## 2023-06-22 ENCOUNTER — Ambulatory Visit (HOSPITAL_BASED_OUTPATIENT_CLINIC_OR_DEPARTMENT_OTHER): Payer: Medicare PPO | Admitting: Anesthesiology

## 2023-06-22 ENCOUNTER — Encounter (HOSPITAL_COMMUNITY): Payer: Self-pay | Admitting: Gastroenterology

## 2023-06-22 ENCOUNTER — Encounter (HOSPITAL_BASED_OUTPATIENT_CLINIC_OR_DEPARTMENT_OTHER): Payer: Self-pay | Admitting: Gastroenterology

## 2023-06-22 DIAGNOSIS — K862 Cyst of pancreas: Secondary | ICD-10-CM

## 2023-06-22 DIAGNOSIS — K863 Pseudocyst of pancreas: Secondary | ICD-10-CM | POA: Insufficient documentation

## 2023-06-22 DIAGNOSIS — Z1501 Genetic susceptibility to malignant neoplasm of breast: Secondary | ICD-10-CM | POA: Insufficient documentation

## 2023-06-22 DIAGNOSIS — Z1509 Genetic susceptibility to other malignant neoplasm: Secondary | ICD-10-CM | POA: Insufficient documentation

## 2023-06-22 DIAGNOSIS — Z800 Family history of malignant neoplasm of digestive organs: Secondary | ICD-10-CM | POA: Insufficient documentation

## 2023-06-22 DIAGNOSIS — R69 Illness, unspecified: Secondary | ICD-10-CM

## 2023-06-22 MED ORDER — LIDOCAINE HCL URETHRAL/MUCOSAL 2 % EX PRSY
6.0000 mL | PREFILLED_SYRINGE | Freq: Once | CUTANEOUS | Status: DC | PRN
Start: 2023-06-22 — End: 2023-06-24

## 2023-06-22 MED ORDER — GLUCAGON HCL (DIAGNOSTIC) 1 MG IJ SOLR
0.5000 mg | INTRAMUSCULAR | Status: DC | PRN
Start: 2023-06-22 — End: 2023-06-24

## 2023-06-22 MED ORDER — GLUCAGON HCL (DIAGNOSTIC) 1 MG IJ SOLR
1.0000 mg | INTRAMUSCULAR | Status: DC | PRN
Start: 2023-06-22 — End: 2023-06-24

## 2023-06-22 MED ORDER — GLUCAGON HCL (DIAGNOSTIC) 1 MG IJ SOLR
0.2500 mg | INTRAMUSCULAR | Status: DC | PRN
Start: 2023-06-22 — End: 2023-06-24

## 2023-06-22 MED ORDER — DEXTROSE 50 % IV SOLN
50.0000 mL | INTRAVENOUS | Status: DC | PRN
Start: 2023-06-22 — End: 2023-06-24

## 2023-06-22 MED ORDER — EPINEPHRINE 1 MG IN NS 10 ML (0.1 MG/ML) INJECTION (RN TO MIX) WRAPPER
1.0000 mg | Status: DC | PRN
Start: 2023-06-22 — End: 2023-06-24

## 2023-06-22 MED ORDER — PROPOFOL 10 MG/ML IV EMUL WRAPPER (OSM ONLY)
INTRAVENOUS | Status: DC | PRN
Start: 2023-06-22 — End: 2023-06-22
  Administered 2023-06-22: 150 ug/kg/min via INTRAVENOUS
  Administered 2023-06-22: 70 mg via INTRAVENOUS

## 2023-06-22 MED ORDER — HEPARIN NA (PORK) LOCK FLUSH 100 UNIT/ML IV SOLN (WRAPPER)
5.0000 mL | Freq: Once | INTRAVENOUS | Status: DC | PRN
Start: 2023-06-22 — End: 2023-06-24

## 2023-06-22 MED ORDER — LACTATED RINGERS IV SOLN
100.0000 mL/h | INTRAVENOUS | Status: DC
Start: 2023-06-22 — End: 2023-06-23

## 2023-06-22 MED ORDER — DEXTROSE 50 % IV SOLN
25.0000 mL | INTRAVENOUS | Status: DC | PRN
Start: 2023-06-22 — End: 2023-06-24

## 2023-06-22 MED ORDER — PROPOFOL 500 MG/50ML IV EMUL INFUSION
INTRAVENOUS | Status: AC
Start: 2023-06-22 — End: 2023-06-22
  Filled 2023-06-22: qty 50

## 2023-06-22 MED ORDER — INSULIN LISPRO (1 UNIT DIAL) 100 UNIT/ML SC SOPN
2.0000 [IU] | PEN_INJECTOR | SUBCUTANEOUS | Status: DC | PRN
Start: 2023-06-22 — End: 2023-06-24

## 2023-06-22 MED ORDER — ATROPINE SULFATE 1 MG/10ML IJ SOSY
0.5000 mg | PREFILLED_SYRINGE | INTRAMUSCULAR | Status: DC | PRN
Start: 2023-06-22 — End: 2023-06-23

## 2023-06-22 MED ORDER — ONDANSETRON HCL 4 MG/2ML IJ SOLN
4.0000 mg | Freq: Three times a day (TID) | INTRAMUSCULAR | Status: DC | PRN
Start: 2023-06-22 — End: 2023-06-23

## 2023-06-22 MED ORDER — ONDANSETRON 4 MG OR TBDP
4.0000 mg | ORAL_TABLET | Freq: Once | ORAL | Status: DC | PRN
Start: 2023-06-22 — End: 2023-06-24

## 2023-06-22 NOTE — H&P (Signed)
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    RELEVANT HISTORY OR CHANGE TO HISTORY:    No past medical history on file.  Past Surgical History:   Procedure Laterality Date    BREAST BIOPSY Bilateral     2001, 2006    BREAST LUMPECTOMY      2001 & 2006    HYSTERECTOMY  2008    SHOULDER SURGERY  2015     Social History     Tobacco Use    Smoking status: Never    Smokeless tobacco: Never   Substance and Sexual Activity    Alcohol use: Not Currently    Drug use: Never     Review of Systems (ROS) neg    OB/GYN   OB/GYN Status: Postmenopausal; 11/20/2022   Lactation Status: The patient was not asked if she was breastfeeding.    Provider Reviewed: No         Medications:  I have reviewed any medications recorded on any pre-procedure assessment or the EMR.  Current Outpatient Medications   Medication Instructions    amLODIPine (NORVASC) 5 mg, Oral, Daily    atorvastatin (LIPITOR) 20 mg, Oral, Nightly    cholecalciferol (VITAMIN D-3) 2,000 units, Oral, Daily    Coenzyme Q10 (COQ-10 OR) 1 tablet, Oral, As needed    Cyanocobalamin (VITAMIN B-12 OR) 1 tablet, Oral, As needed    mometasone 50 MCG/ACT nasal spray 2 sprays, Nasal, As needed    Multiple Vitamin (Multi-Vitamin) tablet 1 tablet, Oral, Daily    Omega-3 Fatty Acids (OMEGA 3 OR) 1 tablet, Oral, As needed    OMEPRAZOLE OR 1 tablet, Oral, As needed    polyethylene glycol 3350 with electrolytes (Golytely) 236 g solution Take per instructions already provided to patient for bowel prep.       Allergies:  I have reviewed any allergies recorded on any pre-procedure assessment or the EMR.  Review  of patient's allergies indicates:  Allergies   Allergen Reactions    Dust Mite Mixed Allergen Ext [Mite (D. Farinae)] Unknown     Allergy test came back positive for dust, reaction is unknown.       Reviewed Labs:  I have reviewed available labs.    Lab Results   Component Value Date    WBC 6.59 02/23/2022    HEMOGLOBIN 12.3 02/23/2022    HEMATOCRIT 39 02/23/2022    PLATELET 393 02/23/2022    SODIUM 136 02/23/2022    POTASSIUM 3.9 02/23/2022    BUN 17 02/23/2022    CREATININE 0.76 02/23/2022    GLUCOSE 92 02/23/2022    AST 23 02/23/2022    ALT 18 02/23/2022    ALK 106 02/23/2022    ALBUMIN 4.4 02/23/2022       Other results pertinent to this procedure include:     Pre-procedure physical exam by MD/DDS/ARNP/PA-C   I have reviewed vital signs recorded on the EMR just prior to the procedure or recorded them below:  BP 133/86 (Patient Position: Sitting)   Pulse 95   Temp 36 C (Temporal)   Resp 20   Ht 5\' 3"  (1.6 m)  SpO2 94%   BMI 29.78 kg/m     Physical Exam    General appearance: Appears healthy and of stated age, orientated to time and place.    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

## 2023-06-22 NOTE — Procedure Nursing Note (Signed)
Patient in endo recovery, stable, ready to be discharged. Post procedure instructions given, patient verbalized understanding of instructions. Patient discharged to pick up area, via wheelchair, accompanied by endo staff.

## 2023-06-22 NOTE — Discharge Instructions (Signed)
[facility]     Gastroenterology Discharge Instructions          During your procedure today you received medications that will affect your body for the next several hours. You may have trouble with coordination, thinking and memory because of these medications. For your safety, please follow these instructions carefully.     Resume your normal medications unless otherwise directed.     DO NOT drive or operate machinery.     DO NOT drink alcohol for the rest of the day.     DO NOT make any legal/business decisions or sign legal documents.     DO NOT participate in activities that require coordination or balance until the day after your exam.     Because C02 (a gas) was put into your body during the procedure expelling small amounts of air/gas is normal.     If you sleep with a CPAP or BIPAP machine, you should use that while napping.          ****Call your doctor immediately if you have any of the following symptoms or report to the emergency room.      Chills and fever over 100 F     Severe chest or abdominal pain      Nausea or vomiting that lasts more than 24 hours.     Persistent or large amount of rectal bleeding or black tarry stools. (spotting is normal if a polyp was removed in your colon)      Any new or unusual symptoms or problems           [facility] PHONE # [phone]         [facility] PHONE # [phone]              You can resume your normal diet unless otherwise instructed. If any specimens were taken during your procedure your pathology results will populate in MyChart and MD interpretation of those results will follow within a few days.

## 2023-06-22 NOTE — Anesthesia Preprocedure Evaluation (Signed)
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 [facility])     Relevant surgical history:       Medications:     Outpatient:   Current Outpatient Medications   Medication Instructions    amLODIPine (NORVASC) 5 mg, Oral, Daily    atorvastatin (LIPITOR) 20 mg, Oral, Nightly    cholecalciferol (VITAMIN D-3) 2,000 units, Oral, Daily    Coenzyme Q10 (COQ-10 OR) 1 tablet, Oral, As needed    Cyanocobalamin (VITAMIN B-12 OR) 1 tablet, Oral, As needed    mometasone 50 MCG/ACT nasal spray 2 sprays, Nasal, As needed    Multiple Vitamin (Multi-Vitamin) tablet 1 tablet, Oral, Daily    Omega-3 Fatty Acids (OMEGA 3 OR) 1 tablet, Oral, As needed    OMEPRAZOLE OR 1 tablet, Oral, As needed    polyethylene glycol 3350 with electrolytes (Golytely) 236 g solution Take per instructions already provided to patient for bowel prep.                Review of patient's allergies indicates:  Allergies   Allergen Reactions    Dust Mite Mixed Allergen Ext [Mite (D. Farinae)] Unknown     Allergy test came back positive for dust, reaction is unknown.       Social History:     Medical History and Review of Systems  Documentation reviewed: Electronic medical record.    Source of information: In person visit and Chart review.  History of anesthetic complications  (-) History of anesthetic complications.      Functional Status   Able to climb 1 flight of stairs without stopping.       Pulmonary   Neg pulmonary ROS    Cardiovascular   (+) hypertension              Physical Exam  Airway  Mallampati:  III  Neck ROM:  Full  Mouth Opening:  Normal    Dental  normal      Cardiovascular  Rhythm:  Regular  Rate:  Normal    Pulmonary   Room air O2 sat  97%           Labs: (last year)   No labs identified within the last year      Relevant procedures / diagnostic studies:     Perioperative Risk Scores:        Obstructive Sleep Apnea (OSA): Low Risk  Total Score: 1              Patient is over 18 years old    Criteria that do not apply:    Patient snores loudly    Patient is often tired    Patient has been observed to stop breathing during sleep    Patient has high blood pressure or is being treated for high blood pressure    Patient BMI is greater than 35 kg/m2    Patient's neck circumference is greater than 17 inches (female) or 16 inches (female)    Patient is female          PAT CLINIC DISCUSSION    ANESTHESIA PLAN  Informed Consent:     Anesthesia Plan discussed with:        Patient    ASA Score:     ASA: 2  Planned Anesthetic Type:      MAC

## 2023-06-22 NOTE — Anesthesia Postprocedure Evaluation (Signed)
Patient: Jasmine Hunt    Procedure Summary:   Date: 06/22/2023  Room/Location: * No operating room entered * / [place] [facility]    Anesthesia Start:  2:42 PM  Anesthesia Stop:  2:54 PM    * No procedures listed *  * No Diagnosis Codes entered *    Responsible Provider:   Lorelle Gibbs, MD  ASA Status: 2         Place of evaluation: PACU    Patient participation: patient participated    Level of consciousness: fully conscious    Patient pain control satisfaction: patient is satisfied with level of pain control    Airway patency: patent    Cardiovascular status during assessment: stable    Respiratory status during assessment: breathing comfortably    Anesthetic complications: no    Intravascular volume status assessment: euvolemic    Nausea / vomiting: patient is not experiencing nausea

## 2023-06-23 ENCOUNTER — Telehealth (HOSPITAL_COMMUNITY): Payer: Self-pay | Admitting: Gastroenterology

## 2023-06-23 NOTE — Telephone Encounter (Signed)
Per Dr [name] procedure note on 8/20, place recall for the following:     Perform magnetic resonance imaging (MRI) with                        gadolinium in 1 year.                       - Repeat the upper endoscopic ultrasound in 2 years for                        surveillance.

## 2023-07-11 ENCOUNTER — Encounter (INDEPENDENT_AMBULATORY_CARE_PROVIDER_SITE_OTHER): Payer: Self-pay

## 2023-07-12 ENCOUNTER — Encounter (HOSPITAL_BASED_OUTPATIENT_CLINIC_OR_DEPARTMENT_OTHER): Payer: Self-pay

## 2023-07-12 ENCOUNTER — Encounter (INDEPENDENT_AMBULATORY_CARE_PROVIDER_SITE_OTHER): Payer: Self-pay

## 2023-07-16 ENCOUNTER — Other Ambulatory Visit (INDEPENDENT_AMBULATORY_CARE_PROVIDER_SITE_OTHER): Payer: Self-pay | Admitting: Internal Medicine

## 2023-07-16 DIAGNOSIS — A6000 Herpesviral infection of urogenital system, unspecified: Secondary | ICD-10-CM

## 2023-07-17 ENCOUNTER — Ambulatory Visit
Admission: RE | Admit: 2023-07-17 | Discharge: 2023-07-17 | Disposition: A | Discharge status: Home / Self Care | Payer: Medicare PPO | Attending: Diagnostic Radiology | Admitting: Diagnostic Radiology

## 2023-07-17 DIAGNOSIS — Z1501 Genetic susceptibility to malignant neoplasm of breast: Secondary | ICD-10-CM | POA: Insufficient documentation

## 2023-07-17 DIAGNOSIS — Z1509 Genetic susceptibility to other malignant neoplasm: Secondary | ICD-10-CM | POA: Insufficient documentation

## 2023-07-17 DIAGNOSIS — C50911 Malignant neoplasm of unspecified site of right female breast: Secondary | ICD-10-CM | POA: Insufficient documentation

## 2023-07-17 DIAGNOSIS — C50912 Malignant neoplasm of unspecified site of left female breast: Secondary | ICD-10-CM | POA: Insufficient documentation

## 2023-07-17 MED ORDER — GADOTERIDOL 279.3 MG/ML IV SOLN
15.0000 mL | Freq: Once | INTRAVENOUS | Status: AC | PRN
Start: 2023-07-17 — End: 2023-07-17
  Administered 2023-07-17: 7.5 mmol via INTRAVENOUS

## 2023-07-19 ENCOUNTER — Other Ambulatory Visit (HOSPITAL_BASED_OUTPATIENT_CLINIC_OR_DEPARTMENT_OTHER): Payer: Self-pay | Admitting: Nurse Practitioner

## 2023-07-19 DIAGNOSIS — C50911 Malignant neoplasm of unspecified site of right female breast: Secondary | ICD-10-CM

## 2023-07-19 NOTE — Telephone Encounter (Signed)
Appt scheduled 08/17/23

## 2023-07-21 ENCOUNTER — Telehealth: Payer: Self-pay | Admitting: Internal Medicine

## 2023-07-21 NOTE — Telephone Encounter (Signed)
Awaiting for the key from the pharmacy

## 2023-07-21 NOTE — Telephone Encounter (Signed)
Patient states that her pharmacy needs prior authorization for her prescription for diazePAM (VALIUM) 5 MG tablet     Patient's pharmacy -     [id number] [place], [place] - [address]  Phone: [phone]   Fax: [phone]     Please advise - last visit was post hospital 05/12/2023, next office visit is 08/17/2023    Phone number for patient - [phone]

## 2023-07-27 ENCOUNTER — Encounter (INDEPENDENT_AMBULATORY_CARE_PROVIDER_SITE_OTHER): Payer: Self-pay | Admitting: Internal Medicine

## 2023-08-10 ENCOUNTER — Encounter (INDEPENDENT_AMBULATORY_CARE_PROVIDER_SITE_OTHER): Payer: Self-pay

## 2023-08-17 ENCOUNTER — Encounter (INDEPENDENT_AMBULATORY_CARE_PROVIDER_SITE_OTHER): Payer: Self-pay | Admitting: Internal Medicine

## 2023-08-17 ENCOUNTER — Ambulatory Visit (INDEPENDENT_AMBULATORY_CARE_PROVIDER_SITE_OTHER): Payer: Medicare Other | Admitting: Internal Medicine

## 2023-08-17 VITALS — BP 138/85 | HR 65 | Temp 97.3°F | Ht 61.0 in | Wt 159.0 lb

## 2023-08-17 DIAGNOSIS — F419 Anxiety disorder, unspecified: Secondary | ICD-10-CM

## 2023-08-17 DIAGNOSIS — Z79899 Other long term (current) drug therapy: Secondary | ICD-10-CM

## 2023-08-17 DIAGNOSIS — R0989 Other specified symptoms and signs involving the circulatory and respiratory systems: Secondary | ICD-10-CM

## 2023-08-17 DIAGNOSIS — Z9884 Bariatric surgery status: Secondary | ICD-10-CM

## 2023-08-17 DIAGNOSIS — E559 Vitamin D deficiency, unspecified: Secondary | ICD-10-CM

## 2023-08-17 DIAGNOSIS — Z1231 Encounter for screening mammogram for malignant neoplasm of breast: Secondary | ICD-10-CM

## 2023-08-17 DIAGNOSIS — I10 Essential (primary) hypertension: Secondary | ICD-10-CM

## 2023-08-17 DIAGNOSIS — A6000 Herpesviral infection of urogenital system, unspecified: Secondary | ICD-10-CM

## 2023-08-17 DIAGNOSIS — D352 Benign neoplasm of pituitary gland: Secondary | ICD-10-CM

## 2023-08-17 DIAGNOSIS — E639 Nutritional deficiency, unspecified: Secondary | ICD-10-CM

## 2023-08-17 DIAGNOSIS — K219 Gastro-esophageal reflux disease without esophagitis: Secondary | ICD-10-CM

## 2023-08-17 LAB — MAGNESIUM: Magnesium: 2.5 mg/dL (ref 1.6–2.6)

## 2023-08-17 LAB — PTH, INTACT: PTH Intact: 103 pg/mL — ABNORMAL HIGH (ref 18.0–89.0)

## 2023-08-17 LAB — FOLATE
Folate: 8.6 ng/mL
Hemolysis Index: 1 {index}

## 2023-08-17 LAB — PROLACTIN: Prolactin: 18.4 ng/mL (ref 5.2–26.5)

## 2023-08-17 LAB — VITAMIN D, 25 OH, TOTAL: Vitamin D 25-OH, Total: 10 ng/mL — ABNORMAL LOW (ref 30–100)

## 2023-08-17 MED ORDER — DIAZEPAM 5 MG PO TABS
5.0000 mg | ORAL_TABLET | Freq: Three times a day (TID) | ORAL | 2 refills | Status: DC | PRN
Start: 2023-08-17 — End: 2024-08-16

## 2023-08-17 MED ORDER — OMEPRAZOLE 40 MG PO CPDR
40.0000 mg | DELAYED_RELEASE_CAPSULE | Freq: Every day | ORAL | 1 refills | Status: DC
Start: 2023-08-17 — End: 2024-02-15

## 2023-08-17 MED ORDER — HYDROCHLOROTHIAZIDE 12.5 MG PO TABS
12.5000 mg | ORAL_TABLET | Freq: Every day | ORAL | 1 refills | Status: DC
Start: 2023-08-17 — End: 2023-12-07

## 2023-08-17 MED ORDER — ATENOLOL 100 MG PO TABS
100.0000 mg | ORAL_TABLET | Freq: Every day | ORAL | 1 refills | Status: AC
Start: 2023-08-17 — End: ?

## 2023-08-17 MED ORDER — VALACYCLOVIR HCL 500 MG PO TABS
ORAL_TABLET | ORAL | 1 refills | Status: DC
Start: 2023-08-17 — End: 2024-08-16

## 2023-08-17 NOTE — Progress Notes (Signed)
Have you seen any specialists/other providers since your last visit with [place]?    No      The patient was informed that the following HM items are still outstanding:   Health Maintenance Due   Topic Date Due    Tetanus Ten-Year  02/25/2009    Pneumonia Vaccine Age 71+ (2 of 2 - PPSV23 or PCV20) 01/23/2019    Medicare Annual Wellness Visit  03/28/2020    INFLUENZA VACCINE  06/03/2023    COVID-19 Vaccine (5 - 2023-24 season) 07/04/2023    MAMMOGRAM  07/31/2023

## 2023-08-17 NOTE — Progress Notes (Signed)
Subjective:      Patient ID: Amber [name] is a 72 y.o. female     Chief Complaint   Patient presents with    Hypertension    Hyperlipidemia    Medication Refill        HPI Pt is here for follow up following problems:     HTN, home BP fluctuating,   No cp, no sob.     Covid infection last month, better now    GERD, stable,   EGD 4/24,   On pantoprazole  No regurgitation,   +upper abd pain felt omeprazole is more effective.     Declined vaccines  Vitamin D def, taking 1K daily  Lab Results   Component Value Date    VITD 10 (L) 08/17/2023    VITD 12 (L) 01/05/2023    VITD 33 04/30/2017    VITD 28 04/30/2017    VITD 5 04/30/2017      S/p gastric bypass, taking MVI supplement    Hx HSV infection, stable, needs refill     Anxiety, takes valium prn.   Stable.     Hx pituitary microadenoma, prolactin producing  Last MRI 2022, has not kept f/u with endo.        The following sections were reviewed this encounter by the provider: Meds    Problems  Surg Hx         Review of Systems   Constitutional:  Negative for appetite change and unexpected weight change.   Respiratory:  Negative for shortness of breath.    Cardiovascular:  Negative for chest pain and palpitations.   Gastrointestinal:  Positive for abdominal pain. Negative for nausea and vomiting.   Genitourinary:  Negative for dysuria.   Musculoskeletal:  Negative for myalgias.   Neurological:  Negative for syncope, light-headedness and headaches.          BP 138/85   Pulse 65   Temp 97.3 F (36.3 C)   Ht 1.549 m (5\' 1" )   Wt 72.1 kg (159 lb)   LMP  (LMP Unknown)   BMI 30.04 kg/m     Objective:     Physical Exam  Vitals reviewed.   Constitutional:       General: She is not in acute distress.     Appearance: She is well-developed. She is obese.   Eyes:      General: No scleral icterus.     Conjunctiva/sclera: Conjunctivae normal.   Neck:      Thyroid: No thyromegaly.      Vascular: No carotid bruit or JVD.   Cardiovascular:      Rate and Rhythm: Normal rate  and regular rhythm.      Heart sounds: Normal heart sounds. No murmur heard.     No friction rub. No gallop.   Pulmonary:      Effort: Pulmonary effort is normal. No respiratory distress.      Breath sounds: Normal breath sounds. No rales.   Abdominal:      General: Bowel sounds are normal. There is no distension.      Palpations: Abdomen is soft.      Tenderness: There is abdominal tenderness. There is no guarding or rebound.   Musculoskeletal:      Cervical back: Neck supple.      Right lower leg: No edema.      Left lower leg: No edema.   Neurological:      Mental Status: She is alert.  Assessment:     1. Gastroesophageal reflux disease, unspecified whether esophagitis present  - omeprazole (PriLOSEC) 40 MG capsule; Take 1 capsule (40 mg) by mouth daily  Dispense: 100 capsule; Refill: 1  - Magnesium; Future  - Magnesium    2. Essential hypertension  - hydroCHLOROthiazide 12.5 MG tablet; Take 1 tablet (12.5 mg) by mouth daily  Dispense: 100 tablet; Refill: 1  - atenolol (TENORMIN) 100 MG tablet; Take 1 tablet (100 mg) by mouth daily  Dispense: 100 tablet; Refill: 1    3. Labile hypertension  - hydroCHLOROthiazide 12.5 MG tablet; Take 1 tablet (12.5 mg) by mouth daily  Dispense: 100 tablet; Refill: 1    4. 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  Dispense: 100 tablet; Refill: 1    6. Encounter for screening mammogram for malignant neoplasm of breast  - Mammo Digital 2D Screening Bilateral W CAD; Future    7. Vitamin D deficiency  - Vitamin D, 25 OH, Total; Future  - Vitamin D, 25 OH, Total    8. S/P gastric bypass  - Folate; Future  - Vitamin A; Future  - Whole Blood Vitamin B1 (Thiamine); Future  - Copper; Future  - Zinc; Future  - PTH, Intact; Future  - Folate  -  Vitamin A  - Whole Blood Vitamin B1 (Thiamine)  - Copper  - Zinc  - PTH, Intact    9. Nutritional deficiency  - Vitamin A; Future  - Whole Blood Vitamin B1 (Thiamine); Future  - Vitamin A  - Whole Blood Vitamin B1 (Thiamine)    10. Medication management  - Magnesium; Future  - Magnesium    11. Pituitary microadenoma  - Prolactin; Future  - Prolactin        Plan:     Labs ordered  Mammogram ordered  D/c pantoprazole  Rx omeprazole.   Diet modifications discussed with patient.   Avoidance of spicy, fatty, caffeine containing foods or beverages, and alcohol.  Keep head of bed elevated. And avoid lying down within 2 hours after meals.  Monitor home BP  Further recommendations pending test results.  F/u 6 months or sooner prn    Excell Seltzer, MD

## 2023-08-18 LAB — ZINC: Zinc: 55 ug/dL — ABNORMAL LOW (ref 60–130)

## 2023-08-19 LAB — COPPER: Copper: 123 ug/dL (ref 70–175)

## 2023-08-20 LAB — VITAMIN A: Vitamin A: 42 ug/dL (ref 38–98)

## 2023-08-21 ENCOUNTER — Other Ambulatory Visit (INDEPENDENT_AMBULATORY_CARE_PROVIDER_SITE_OTHER): Payer: Self-pay | Admitting: Internal Medicine

## 2023-08-21 DIAGNOSIS — E559 Vitamin D deficiency, unspecified: Secondary | ICD-10-CM

## 2023-08-21 LAB — WHOLE BLOOD VITAMIN B1 (THIAMINE): Vitamin B1, Whole Blood: 130 nmol/L (ref 78–185)

## 2023-08-21 MED ORDER — VITAMIN D3 1.25 MG (50000 UT) PO CAPS
1.0000 | ORAL_CAPSULE | ORAL | 1 refills | Status: DC
Start: 2023-08-21 — End: 2024-02-15

## 2023-08-21 NOTE — Progress Notes (Signed)
Zinc level is normal  Vitamin D level is moderately low  Parathyroid level is mildly high  Rest of labs are normal    Rec:  1. Take otc zinc supplement  2. Take calcium 1200mg  daily supplement  3. Take rx vitamin D supplement  3. Keep follow up visit    Rx vitamin D sent to pharmacy

## 2023-08-24 ENCOUNTER — Encounter (HOSPITAL_BASED_OUTPATIENT_CLINIC_OR_DEPARTMENT_OTHER): Payer: Self-pay

## 2023-08-24 NOTE — Telephone Encounter (Signed)
Left voicemail with patient regarding Tuesday 10/29 appointment to see if able to slightly adjust check in time from 9:15am to 9:45am.     Requested either call back or Mychart response for confirmation.

## 2023-08-29 NOTE — Progress Notes (Signed)
 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  Presents today for routine follow-up.       ASSESSMENT/PLAN  71yoF with hx left breast cancer and BRCA1 mutation positive.  Today 08/31/2023 she is ***      1. 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 Patient received 6 cycles of CMF chemotherapy followed by radiation therapy.      2. BRCA1 mutation positive. S/p bilateral salpingo-oophorectomy.     - High risk breast cancer surveillance with yearly screening mammogram alternating with yearly bilateral screening MRI.  -- Last screening breast MRI on 07/17/23 with benign findings.  -- Screening Mammogram done 11/2022 was stable and benign.   -- Due for a repeat mammogram in January 2025, Breast MRI in September 2025    - Pancreatic cancer surveillance screening. family history. Brca1 +. Patient follows Dr [name]. MRI alternating with endoscopy every other year.  -- 06/22/23 EUS: showed stable small (4mm) cystic lesion in pancreatic head w/o solid mass or main duct involvement.  No other significant pathology identified.    -- Due for MRI approx Aug 2025; repeat EUS 2026      Follow-up:  RTC in 6 months, sooner if needed ***      ----------    CURRENT TREATMENT  Surveillance    INTERVAL HISTORY  Last seen 02/12/23 by Dr. Myrlene Broker.  ***    ***Doing quite well.  She reports no new symptoms or concerns.   works full-time for the [employer], thinking its time to retire.  Exercising daily      ONCOLOGIC HISTORY (copied from prior notes)  Patient has been followed by Dr. [name] at [facility] but she just retired. Oncology history as above but briefly in 2001 patient had right breast carcinoma, node positive and she underwent lumpectomy followed  by chemotherapy with AC plus T followed by radiation therapy and almost 5 years later she developed stage I left breast carcinoma also triple negative and she underwent chemotherapy with CMF followed by radiation therapy. Unfortunately I do not have all the details about her cancers including grade. Looks like initially patient was tested negative for the genetic mutation but then she was referred to [facility] [place] and looks like she turned out to be BRCA1 positive. She has extensive history of cancer in her family as above including father with prostate, mother with pancreatic, sister with pancreatic and breast and another sister with breast cancer x2. Looks like her brother who does not have cancer just test positive. Patient also developed left breast carcinoma in 2006 and it was a stage I and she underwent 6 cycles of CMF chemotherapy followed by radiation therapy. Also in 2006 she underwent bilateral salpingo-oophorectomy and for the last few years she has been following with GI in [place] and having EGD down to the pancreas. She also had MRI of the pancreas in September 2021 that was unremarkable. Overall patient is doing well and she denies any problems specifically she denies any issues with her breasts, she has no abdominal pain or any symptoms.    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 (HCC)    BRCA1 gene mutation positive       PAST SURGICAL HISTORY    Past Surgical History:   Procedure Laterality Date    BREAST BIOPSY Bilateral     2001, 2006    BREAST LUMPECTOMY      2001 & 2006    HYSTERECTOMY  2008    SHOULDER SURGERY  2015     fff  FAMILY HISTORY    Family History       Problem (# of Occurrences) Relation (Name,Age of Onset)    Breast Cancer (3) Mother (108), Sister Marisue Ivan  Vigil), Sister    BRCA Mutation (1) Sister Marisue Ivan Vigil)        Strong family history with numerous family members with BRCA 1 positivity    SOCIAL HISTORY    Social History     Social History Narrative    Not on file           ALLERGIES    Review of patient's allergies indicates:  Allergies   Allergen Reactions    Dust Mite Mixed Allergen Ext [Mite (D. Farinae)] Unknown     Allergy test came back positive for dust, reaction is unknown.         MEDICATIONS      Current Outpatient Medications:     amLODIPine 5 MG tablet, Take 5 mg by mouth daily., Disp: , Rfl:     atorvastatin 20 MG tablet, Take 20 mg by mouth at bedtime., Disp: , Rfl:     cholecalciferol 50 mcg (2,000 unit) capsule, Take 2,000 units by mouth daily., Disp: , Rfl:     Coenzyme Q10 (COQ-10 OR), Take 1 tablet by mouth as needed., Disp: , Rfl:     Cyanocobalamin (VITAMIN B-12 OR), Take 1 tablet by mouth as needed., Disp: , Rfl:     mometasone 50 MCG/ACT nasal spray, Spray 2 sprays in the nostril as needed., Disp: , Rfl:     Multiple Vitamin (Multi-Vitamin) tablet, Take 1 tablet by mouth daily., Disp: , Rfl:     Omega-3 Fatty Acids (OMEGA 3 OR), Take 1 tablet by mouth as needed., Disp: , Rfl:     OMEPRAZOLE OR, Take 1 tablet by mouth as needed., Disp: , Rfl:     polyethylene glycol 3350 with electrolytes (Golytely) 236 g solution, Take per instructions already provided to patient for bowel prep., Disp: 4000 mL, Rfl: 0         PHYSICAL EXAM    There were no vitals taken for this visit.  ***  ECOG: 0  General: Pleasant, well-appearing. No acute distress.***  HEENT: Sclerae anicteric.   Lungs: Breathing unlabored.  Breast: Well-healed the scars over both breasts.  No palpable masses.  No nipple changes and no palpable bilateral axillary nodes.  Abdomen: Bowel sounds present. Soft, nontender, nondistended.   Neuro: Alert and oriented x4. No focal deficits.  Skin: Without rash.    LABORATORY RESULTS  ***  Results for orders placed or performed in visit on  02/23/22   CBC with Differential   Result Value Ref Range    WBC 6.59 4.3 - 10.0 10*3/uL    RBC 4.60 3.80 - 5.00 10*6/uL    Hemoglobin 12.3 11.5 - 15.5 g/dL    Hematocrit 39 96.0 - 45.0 %    MCV 84 81 - 98 fL  MCH 26.7 (L) 27.3 - 33.6 pg    MCHC 31.9 (L) 32.2 - 36.5 g/dL    Platelet Count 161 096 - 400 10*3/uL    RDW-CV 14.5 11.0 - 14.5 %    % Neutrophils 42 %    % Lymphocytes 42 %    % Monocytes 12 %    % Eosinophils 3 %    % Basophils 1 %    % Immature Granulocytes 0 %    Neutrophils 2.76 1.80 - 7.00 10*3/uL    Absolute Lymphocyte Count 2.74 1.00 - 4.80 10*3/uL    Monocytes 0.82 (H) 0.00 - 0.80 10*3/uL    Absolute Eosinophil Count 0.21 0.00 - 0.50 10*3/uL    Basophils 0.05 0.00 - 0.20 10*3/uL    Immature Granulocytes 0.01 0.00 - 0.05 10*3/uL    Nucleated RBC 0.00 0.00 10*3/uL    % Nucleated RBC 0 %   Comprehensive Metabolic Panel   Result Value Ref Range    Sodium 136 135 - 145 meq/L    Potassium 3.9 3.6 - 5.2 meq/L    Chloride 104 98 - 108 meq/L    Carbon Dioxide, Total 26 22 - 32 meq/L    Anion Gap 6 4 - 12    Glucose 92 62 - 125 mg/dL    Urea Nitrogen 17 8 - 21 mg/dL    Creatinine 0.45 4.09 - 1.02 mg/dL    Protein (Total) 7.7 6.0 - 8.2 g/dL    Albumin 4.4 3.5 - 5.2 g/dL    Bilirubin (Total) 0.5 0.2 - 1.3 mg/dL    Calcium 9.4 8.9 - 81.1 mg/dL    AST (GOT) 23 9 - 38 U/L    Alkaline Phosphatase (Total) 106 38 - 172 U/L    ALT (GPT) 18 7 - 33 U/L    eGFR by CKD-EPI 2021 >60 >59 mL/min/1.73_m2       ----------    Woodroe Chen, DNP  Nurse Practitioner  Medical Oncology, [facility]    Time Statement: I spent a total of *** minutes for the patient's care on the date of service.

## 2023-08-30 ENCOUNTER — Telehealth (INDEPENDENT_AMBULATORY_CARE_PROVIDER_SITE_OTHER): Payer: Self-pay | Admitting: Internal Medicine

## 2023-08-30 NOTE — Telephone Encounter (Signed)
PA sent to plan on covermymeds for diazePAM (VALIUM) 5 MG tablet.   Awaiting approval status.   PA Key: [id number]

## 2023-08-31 ENCOUNTER — Ambulatory Visit: Payer: Medicare PPO | Attending: Nurse Practitioner | Admitting: Nurse Practitioner

## 2023-08-31 ENCOUNTER — Ambulatory Visit (HOSPITAL_BASED_OUTPATIENT_CLINIC_OR_DEPARTMENT_OTHER): Payer: Self-pay | Admitting: Nurse Practitioner

## 2023-08-31 VITALS — BP 145/89 | HR 96 | Temp 98.2°F | Resp 18 | Ht 64.0 in | Wt 166.9 lb

## 2023-08-31 DIAGNOSIS — G608 Other hereditary and idiopathic neuropathies: Secondary | ICD-10-CM | POA: Insufficient documentation

## 2023-08-31 DIAGNOSIS — Z1231 Encounter for screening mammogram for malignant neoplasm of breast: Secondary | ICD-10-CM | POA: Insufficient documentation

## 2023-08-31 DIAGNOSIS — Z1509 Genetic susceptibility to other malignant neoplasm: Secondary | ICD-10-CM | POA: Insufficient documentation

## 2023-08-31 DIAGNOSIS — C50911 Malignant neoplasm of unspecified site of right female breast: Secondary | ICD-10-CM | POA: Insufficient documentation

## 2023-08-31 DIAGNOSIS — Z1501 Genetic susceptibility to malignant neoplasm of breast: Secondary | ICD-10-CM | POA: Insufficient documentation

## 2023-08-31 DIAGNOSIS — C50912 Malignant neoplasm of unspecified site of left female breast: Secondary | ICD-10-CM | POA: Insufficient documentation

## 2023-08-31 DIAGNOSIS — Z08 Encounter for follow-up examination after completed treatment for malignant neoplasm: Secondary | ICD-10-CM | POA: Insufficient documentation

## 2023-08-31 DIAGNOSIS — Z853 Personal history of malignant neoplasm of breast: Secondary | ICD-10-CM | POA: Insufficient documentation

## 2023-09-09 ENCOUNTER — Encounter (HOSPITAL_BASED_OUTPATIENT_CLINIC_OR_DEPARTMENT_OTHER): Payer: Self-pay

## 2023-09-10 ENCOUNTER — Encounter (INDEPENDENT_AMBULATORY_CARE_PROVIDER_SITE_OTHER): Payer: Self-pay

## 2023-09-17 ENCOUNTER — Encounter (HOSPITAL_BASED_OUTPATIENT_CLINIC_OR_DEPARTMENT_OTHER): Payer: Self-pay

## 2023-10-10 ENCOUNTER — Encounter (INDEPENDENT_AMBULATORY_CARE_PROVIDER_SITE_OTHER): Payer: Self-pay

## 2023-10-25 ENCOUNTER — Emergency Department: Payer: Self-pay

## 2023-10-26 NOTE — ED Provider Notes (Signed)
 [facility]   EMERGENCY DEPARTMENT ENCOUNTER  10/25/2023    Jasmine Hunt  72 yo female, DOB: 09/10/1961  MRN: [id number]      FINAL IMPRESSION  1. Palpitations        PLAN  Discharge Medication List as of 10/25/2023  4:29 PM              Discharge Instructions         Return here immediately if symptoms worsen, change or you have new concerns. Otherwise follow up with your primary care provider within 2-3 days for re-evaluation.     Talk to your primary care provider about a referral for a Zio patch or Holter monitor.    It has been our privilege to take care of you in the emergency department today. Your primary concerns have been evaluated and at this time it appears these do not represent an acutely life threatening illness. Please take medicines that are prescribed to you as directed. It is crucial to follow up with a primary doctor in the time frame recommended as many health conditions that seem self-limited initially may actually worsen over time. If you feel you are getting worse then return to the Emergency Department immediately for further evaluation.             COURSE & MEDICAL DECISION MAKING  MDM - 72 yo female with brief episode of anxiety and sensation of heart racing. EKG nl, continuous cardiac monitoring normal. No prior cardiac history or red flags concerning for dysrhythmia or ACS. Patient states she has been referred to cardiology for this, appointment pending. I encouraged her to ask PCP for holter monitor referral or Zio patch, instructed her to RTED if symptoms worsen, change or she had new concerns.     Medical Decision Making  Amount and/or Complexity of Data Reviewed  External Data Reviewed: notes.     Details: Cardiac history reviewed. PDMP reviewed.   ECG/medicine tests: ordered and independent interpretation performed. Decision-making details documented in ED Course.             CHIEF COMPLAINT  Chief Complaint   Patient presents with   . Tachycardia          HPI  Jasmine Hunt is a 72 y.o. female who presents to the ED with CC of heart racing which occurred today while driving. Patient reports similar recent episodes, has discussed with PCP, who referred her to [facility] cardiology, but can't be seen until March. Patient thinks symptoms are anxiety associated. She denies CP.       REVIEW OF SYSTEMS  Review of Systems   HENT: Negative.     Cardiovascular:  Positive for palpitations. Negative for chest pain.   Gastrointestinal: Negative.    Genitourinary: Negative.    Musculoskeletal: Negative.    Skin: Negative.    Neurological: Negative.    Psychiatric/Behavioral:  The patient is nervous/anxious.        See HPI for further details. Review of systems otherwise negative.       CURRENT PROBLEM LIST  Patient Active Problem List   Diagnosis   . Left medial knee pain   . Breast cancer    . BRCA gene positive   . Family history of pancreatic cancer         PAST MEDICAL HISTORY  Past Medical History:   Diagnosis Date   . Adverse effect of anesthesia      N/V   . Anesthesia  N/V   . Breast cancer (HCC)  2001     RIGHT - S/P LUMPECTOMY, CHEMO    . Breast cancer (HCC)  2005     LEFT SIDE - S/P LUMPECTOMY, CHEMO    . Cancer (HCC)    . Diverticulitis     February 2013   . Environmental allergies    . GERD (gastroesophageal reflux disease)      OTC MED PRN   . HBP (high blood pressure)    . HX OTHER MEDICAL     CHEMOTHERAPY   . Menopause    . No known problems    . Radiation    . Torn rotator cuff  2009     LEFT SIDE, CORTISONE SHOTS    . Uterovaginal prolapse, incomplete  05/2009     SCHED FOR SURGERY 06/13/2009         FAMILY HISTORY  Family History   Problem Relation Age of Onset   . Breast cancer Mother 91   . Cancer Mother 45        pancreatic   . Breast cancer Sister 78   . Cancer Sister         pancreatic   . Breast cancer Sister 59         SOCIAL HISTORY  Social History     Socioeconomic History   . Marital status: Divorced   Tobacco Use   . Smoking status:  Never         SURGICAL HISTORY  Past Surgical History:   Procedure Laterality Date   . BREAST BIOPSY  2012    Right US   BX   . BREAST BIOPSY Left 01/04/2017   . BREAST BIOPSY Left 01/19/2019    Procedure: US  GUIDED BREAST BIOPSY LEFT - Location: [facility]   . BREAST LUMPECTOMY   2001      RIGHT BREAST   . BREAST LUMPECTOMY   2005     LEFT BREAST   . BREAST LUMPECTOMY  2001    Left    . BREAST LUMPECTOMY  2006    Right   . COLONOSCOPY  01/2012   . OTHER SURGICAL HISTORY       OTHER OPTHALMOLOGIC - 2004   . OTHER SURGICAL HISTORY   2008    HYSTERECTOMY -  LAP HYST/BSO w/ DAVINCI ROBOT   . PR SLING OPER STRES INCONTINENCE  06/13/2009    Procedure:PUBOVAGINAL SLING; Surgeon:DRESCHER, CARLIN ORN; Location:[facility]; Service:Gynecology         CURRENT MEDICATIONS  Prior to Admission medications    Medication Sig Start Date End Date Taking? Authorizing Provider   albuterol 90 mcg/puff inhaler inhale 2 puffs by mouth and INTO THE LUNGS four times a day if needed for DYSPNEA 03/17/23  Yes Historical Provider, MD   amLODIPine (NORVASC) 5 mg tablet Take 1 tablet by mouth Daily. 07/31/23  Yes Historical Provider, MD   atorvaSTATin (LIPITOR) 20 mg tablet Take 1 tablet by mouth at bedtime at bedtime.. 10/31/19  Yes Historical Provider, MD   cholecalciferol (D2000 ULTRA STRENGTH) 2,000 units capsule Take 1 capsule by mouth. 10/21/09  Yes Provider Unknown Conversion Transaction   mometasone (NASONEX) 50 mcg/nasal spray Inhale 2 Sprays in Both Nostrils as needed. 06/27/14   Provider Unknown Conversion Transaction   Multiple Vitamin (MULTI-VITAMIN) TABS Take 1 tablet by mouth Daily. 11/10/10  Yes Provider Unknown Conversion Transaction   OMEPRAZOLE-SODIUM BICARBONATE PO Take  by mouth as needed. 06/06/09   Provider  Unknown Conversion Transaction         ALLERGIES  No Known Allergies      PHYSICAL EXAM  VITAL SIGNS: BP 135/87   Pulse 94   Temp 35.8 C (96.5 F) (Oral)   Resp 16   Ht 1.626 m (5' 4)   Wt 75.8 kg (167 lb)    SpO2 94%   BMI 28.67 kg/m   Constitutional: Well developed, Well nourished, No acute distress, Non-toxic appearance.  Alert and oriented x3.  Cooperative.  HENT: Normocephalic, Atraumatic, Bilateral external ears normal, Nose normal.   Eyes: PERRLA, EOMI, Conjunctiva normal, No discharge.   Neck: Normal range of motion, No tenderness, Supple, No stridor.   Cardiovascular: Normal heart rate, Normal rhythm, No murmurs.   Thorax & Lungs: Normal breath sounds, No respiratory distress, No wheezing, No chest tenderness.   Abdomen: Bowel sounds normal, Soft, No tenderness, No masses, No pulsatile masses.   Skin: Warm, Dry, No erythema, No rash.   Back: No tenderness, No CVA tenderness.   Extremities: Intact distal pulses, No edema, No tenderness, No cyanosis, No clubbing.   Musculoskeletal: Good range of motion in all major joints. No tenderness to palpation or major deformities noted.   Neurologic: Alert & oriented x 3, Normal motor function, Normal sensory function, No focal deficits noted.   Psychiatric: Affect normal, Judgment normal, Mood normal.       EKG  Continous cardiac monitoring while in ED - NSR, no ectopy.  EKG - Sinus rhythm, rate 100, nl intervals, nl axis, nl ST, no evidence of STEMI or ischemia.     LABORATORY  Labs Reviewed   URINALYSIS, REFLEX MICROSCOPIC AND/OR CULTURE - Abnormal; Notable for the following components:       Result Value    Specific Gravity, Urine 1.002 (*)     All other components within normal limits               Date of service 10/25/2023         Norleen Doyne Fantasia, MD  10/26/23 [phone]

## 2023-11-08 ENCOUNTER — Other Ambulatory Visit (INDEPENDENT_AMBULATORY_CARE_PROVIDER_SITE_OTHER): Payer: Self-pay | Admitting: Cardiology

## 2023-11-10 ENCOUNTER — Encounter (INDEPENDENT_AMBULATORY_CARE_PROVIDER_SITE_OTHER): Payer: Self-pay

## 2023-11-10 ENCOUNTER — Other Ambulatory Visit: Payer: Self-pay

## 2023-11-16 NOTE — Progress Notes (Signed)
 [facility]  Cardiovascular Diseases Consult/New Patient Visit      Name: Jasmine Hunt  Primary Care Physician: Lonni PARAS. Adrig, PA-C     DOB: 11-18-1950   Referring Physician: Rusty Lonni Petri  MRN: [id number]  Date of Service: 11/16/2023         ASSESSMENT/PLAN:   1. Abnormal electrocardiogram (ECG) (EKG) (Primary):  Sinus tachycardia but there is reverse R wave progression between leads V2 and V3 which is probably not significant but could not involve a previous infarction.  Check an echo to rule out LV dysfunction and other structural heart disease.    2. Sinus tachycardia:  Holter monitor pending.  With persistent sinus tachycardia consider beta-blockade.    3. Essential (primary) hypertension:  Continue amlodipine 10 mg p.o. daily.  Weight loss and exercise will also help.    4. Dyslipidemia:  Diet, exercise and atorvastatin.         Disposition:  Return in about 1 month (around 12/17/2023) for Follow up, with Dempsey LELON Polite, MD, After ECHO has been completed.    HISTORY:   Source of History:  the patient, chart review.  Chief Complaint:   Establish Care    History of Present Illness:  Jasmine Hunt is a 73 y.o. female is seen in the clinic for a new patient consult. She is here because she has had the recent development of tachycardia and had an abnormal ECG which showed sinus tachycardia but also reverse R wave progression between leads V2 and V3 which could represent a prior infarction.  She denies chest pain or increasing shortness of breath.  No PND or orthopnea or pedal edema but she does feel palpitations and tachycardia.  No presyncope or syncope.  Family history of coronary disease.  She has hypertension and dyslipidemia but denies diabetes or smoking.    ROS:   General: Negative  Cardiovascular: Palpitations/ irregular heartbeat  Respiratory: Negative  Hematologic: Negative  Neurologic: Negative    Current Medications:  Current Outpatient  Medications (Cardiac Medications)   Medication Sig   . amLODIPine (NORVASC) 5 mg tablet Take 1 tablet by mouth Daily.   SABRA atorvaSTATin (LIPITOR) 20 mg tablet Take 1 tablet by mouth at bedtime at bedtime..     Current Outpatient Medications (Other)   Medication Sig   . albuterol 90 mcg/puff inhaler inhale 2 puffs by mouth and INTO THE LUNGS four times a day if needed for DYSPNEA   . cholecalciferol (D2000 ULTRA STRENGTH) 2,000 units capsule Take 1 capsule by mouth.   . escitalopram (LEXAPRO) 5 MG tablet Take 1 tablet by mouth Daily.   . mometasone (NASONEX) 50 mcg/nasal spray Inhale 2 Sprays in Both Nostrils as needed.   . Multiple Vitamin (MULTI-VITAMIN) TABS Take 1 tablet by mouth Daily.   SABRA OMEPRAZOLE-SODIUM BICARBONATE PO Take  by mouth as needed.      Allergies:  Allergies   Allergen Reactions   . Lisinopril Cough and Other (See Comments)      Reviewed and updated this visit:  Tobacco  Allergies  Meds  Problems  Med Hx  Surg Hx             OBJECTIVE:   BP 130/70   Pulse 112   Resp 14   Wt 77.1 kg (170 lb)   SpO2 94%   BMI 29.18 kg/m      Physical Exam:  Constitutional:    The patient is well developed, well-nourished  No acute distress,  conversant  ENMT:   Oral mucosa is normal without pallor or cyanosis  Eyes:   Conjunctivae are anicteric and not-injected No xanthelasma  Respiratory:    Respiratory effort normal  no rales, rhonchi or wheezing   Chest wall:  Normal  Cardiovascular:    Tachycardic, regular, normal S1 and S2, no S3 or S4., No Systolic Murmur., No Diastolic Murmur., No rubs  Jugular venous distention, 8 cm above the right atrium  no edema  Carotid arteries 2+ no bruit  Abdominal aorta is not enlarged, no abdominal bruit  Radial pulses are 2+ bilaterally  Femoral pulses are 2+ bilaterally  Pedal pulses are 2+ bilaterally  Abdomen:   soft, non-tender; bowel sounds normal; no masses,  No hepatosplenomegaly  Extremities:   extremities normal, no clubbing, no cyanosis  MSK:   No kyphosis  No  scoliosis  Skin:    No rashes, no jaundice  No changes of chronic venous stasis  No cyanosis.   No xanthomas  Neuro/Psych:    normal without focal findings,   mental status, speech normal, alert and oriented x3  Insight and Judgement normal  Euthymic    Data Reviewed at Today's Visit   Record Review encounter date: 11/16/2023    Diagnostic Tests:  NA, K, CO2, BUN & Creatinine:  Lab Results   Component Value Date    NA 138 12/17/2020    NA 138 07/29/2020    NA 136 01/15/2020    K 4.2 12/17/2020    K 3.9 07/29/2020    K 3.9 01/15/2020    CO2 28 12/17/2020    CO2 24 07/29/2020    CO2 24 01/15/2020    BUN 17 12/17/2020    BUN 17 07/29/2020    BUN 19 01/15/2020    CREA 0.78 12/17/2020    CREA 0.79 07/29/2020    CREA 0.75 01/15/2020        WBC, RBC, HGB, HCT & Platelet:  Lab Results   Component Value Date    WBC 6.1 12/17/2020    WBC 6.2 07/29/2020    WBC 5.7 01/15/2020    RBC 5.15 12/17/2020    RBC 4.64 07/29/2020    RBC 4.77 01/15/2020    HGB 14.1 12/17/2020    HGB 13.1 07/29/2020    HGB 13.2 01/15/2020    HCT 43.3 12/17/2020    HCT 38.9 07/29/2020    HCT 40.6 01/15/2020    PLT 336 12/17/2020    PLT 365 07/29/2020    PLT 333 01/15/2020      Thyroid :  No results found for: TSH, FT3, FT4  Cholesterol:  No results found for: CHOL, HDL, LDL, TRIG, CHOLHDL  Hemoglobin A1C:   No results found for: HBA1C  Natriuretic Peptide Tests:   No results found for: NTPROBNP  No results found for: BNP  Digoxin  No results found for: DATELASTDOSE, TIMELASTDOSE, DIGOXIN       Cardiology Studies:   10/25/2023 ECG   Sinus Rhythm   WITHIN NORMAL LIMITS    11/22/2021 CT Angiogram Chest   CHEST WALL AND LOWER NECK: Postsurgical changes in the bilateral breast soft tissues, right greater than left.  AXILLA: Metallic clip within a prominent right axillary lymph node, measuring up to 1.2 cm in short axis dimension.  HILA AND MEDIASTINUM: No pathologic adenopathy.  HEART AND PERICARDIUM: Normal.  AORTA / PULMONARY VESSELS:  No pulmonary embolism. Normal caliber thoracic aorta, containing minimal calcified atherosclerotic plaque  LUNGS, LARGE AIRWAYS, PLEURA: Minimal linear atelectasis  or scarring in the lingula and medial lung bases. No focal consolidation, pleural effusion or pneumothorax.SABRA  BONES:  Multilevel degenerative changes of the imaged spine.  UPPER ABDOMEN:  Normal.      Hospitalized or in the ED:  No     Historical data   Last Visit in Dept: Visit date not found      -------------------------------      Dempsey LELON Polite, MD    Copy to :  Lonni PARAS. Adrig, PA-C

## 2023-12-07 ENCOUNTER — Encounter (INDEPENDENT_AMBULATORY_CARE_PROVIDER_SITE_OTHER): Payer: Self-pay | Admitting: Cardiology

## 2023-12-07 ENCOUNTER — Ambulatory Visit (INDEPENDENT_AMBULATORY_CARE_PROVIDER_SITE_OTHER): Payer: Medicare Other | Admitting: Cardiology

## 2023-12-07 VITALS — BP 128/84 | HR 66 | Ht 61.0 in | Wt 162.0 lb

## 2023-12-07 DIAGNOSIS — I10 Essential (primary) hypertension: Secondary | ICD-10-CM

## 2023-12-07 LAB — ECG 12-LEAD
Atrial Rate: 63 {beats}/min
IHS MUSE NARRATIVE AND IMPRESSION: NORMAL
P Axis: 38 degrees
P-R Interval: 178 ms
Q-T Interval: 398 ms
QRS Duration: 82 ms
QTC Calculation (Bezet): 407 ms
R Axis: 21 degrees
T Axis: 21 degrees
Ventricular Rate: 63 {beats}/min

## 2023-12-07 NOTE — Progress Notes (Signed)
 Amber [name] 73 y.o. with a history of HTN presents for cardiac follow up of hypertension.     Since her gastric surgery, she has been intolerant to different medications due to GI distress.       Hypertension: Multiple medication intolerances since bariatric surgery.  Currently she is on losartan  50 mg twice a day and atenolol  100 mg daily.   She is currently on hydrochlorothiazide  12.5 mg daily, but does feel like she is dehydrated and she has intermittent episodes of dizziness.  She was intolerant to the recently prescribed  clonidine  oral and patch, nifedipine  ( coughing).   With losartan  she had significant nausea.  She also had nausea with amlodipine .    She had nausea with lisinopril  in the past     Cerebrovascular disease: History of a TIA.  She has had no recurrence.  This TIA occurred when she was not on aspirin .  Currently compliant with aspirin .      Cardiac work up has included     Echocardiogram (04/04/2020): Normal LV/RV size and systolic function.  Ejection fraction 60%.  Mild left ventricular hypertrophy.  No significant valvular heart disease.  No pericardial effusion.                    - stress test (11/06/17 - normal myocardial perfusion)               - cardiac cath ( 01/08/14 - normal )              - echocardiogram ( EF 55%, mild TR)        Current Outpatient Medications   Medication Sig Dispense Refill    aspirin  EC 81 MG EC tablet Take 1 tablet (81 mg total) by mouth daily. 30 tablet 1    atenolol  (TENORMIN ) 100 MG tablet Take 1 tablet (100 mg) by mouth daily 100 tablet 1    Cyanocobalamin  (B-12 IJ) Inject as directed every 30 (thirty) days         diazePAM  (VALIUM ) 5 MG tablet Take 1 tablet (5 mg) by mouth every 8 (eight) hours as needed for Anxiety 30 tablet 2    fluticasone  (FLONASE ) 50 MCG/ACT nasal spray 2 sprays by Nasal route daily. 16 g 2    losartan  (COZAAR ) 50 MG tablet Take 1 tablet (50 mg) by mouth 2 (two) times daily 180 tablet 3    Multiple Vitamin (MULTIVITAMIN) capsule Take  1 capsule by mouth daily      omeprazole  (PriLOSEC) 40 MG capsule Take 1 capsule (40 mg) by mouth daily 100 capsule 1    valACYclovir  HCL (VALTREX ) 500 MG tablet TAKE 1 TABLET(500 MG) BY MOUTH DAILY. 100 tablet 1    VITAMIN D  PO Take by mouth      cefdinir (OMNICEF) 300 MG capsule Take 1 capsule (300 mg) by mouth 2 (two) times daily      Cholecalciferol (Vitamin D3) 1.25 MG (50000 UT) Cap Take 1 capsule by mouth once a week (Patient not taking: Reported on 12/07/2023) 12 capsule 1    gabapentin  (NEURONTIN ) 300 MG capsule daily as needed (Patient not taking: Reported on 12/07/2023)      ondansetron  (ZOFRAN -ODT) 4 MG disintegrating tablet Take 1 tablet (4 mg total) by mouth every 8 (eight) hours as needed for Nausea (Patient not taking: Reported on 12/07/2023) 30 tablet 2     No current facility-administered medications for this visit.  PE:    Vitals:    12/07/23 0809   BP: 128/84   Pulse: 66   SpO2: 100%     Body mass index is 30.61 kg/m.    Physical Examination: General appearance - alert, well appearing, and in no distress  Mental status - alert, oriented to person, place, and time  Chest - clear to auscultation, no wheezes, rales or rhonchi, symmetric air entry  Heart - normal rate and regular rhythm, S1 and S2 normal  Abdomen - soft, nontender, nondistended, no masses or organomegaly  Extremities - no pedal edema noted    -Exam unchanged from prior visit        Labs:  Lipid Panel   Cholesterol   Date/Time Value Ref Range Status   01/05/2023 08:08 AM 188 <200 mg/dL Final     Triglycerides   Date/Time Value Ref Range Status   01/05/2023 08:08 AM 59 <150 mg/dL Final   96/75/7976 90:54 AM 39 34 - 149 mg/dL Final     HDL   Date/Time Value Ref Range Status   01/05/2023 08:08 AM 77 > OR = 50 mg/dL Final       CMP:   Sodium   Date/Time Value Ref Range Status   05/12/2023 02:27 PM 141 135 - 145 mEq/L Final   01/05/2023 08:08 AM 142 135 - 146 mmol/L Final     Potassium   Date/Time Value Ref Range Status   05/12/2023  02:27 PM 4.1 3.5 - 5.3 mEq/L Final   01/05/2023 08:08 AM 4.4 3.5 - 5.3 mmol/L Final     Chloride   Date/Time Value Ref Range Status   05/12/2023 02:27 PM 104 99 - 111 mEq/L Final   01/05/2023 08:08 AM 106 98 - 110 mmol/L Final     CO2   Date/Time Value Ref Range Status   05/12/2023 02:27 PM 26 17 - 29 mEq/L Final   01/05/2023 08:08 AM 29 20 - 32 mmol/L Final     Glucose   Date/Time Value Ref Range Status   05/12/2023 02:27 PM 93 70 - 100 mg/dL Final     Comment:     ADA Guidelines for diabetes mellitus:  Fasting: Equal to or greater than 126 mg/dL  Random: Equal to or greater than 200 mg/dL   96/94/7975 91:91 AM 87 65 - 99 mg/dL Final     Comment:                   Fasting reference interval          BUN   Date/Time Value Ref Range Status   05/12/2023 02:27 PM 11 7 - 21 mg/dL Final   96/94/7975 91:91 AM 9 7 - 25 mg/dL Final     Protein, Total   Date/Time Value Ref Range Status   01/05/2023 08:08 AM 6.6 6.1 - 8.1 g/dL Final     Alkaline Phosphatase   Date/Time Value Ref Range Status   01/05/2023 08:08 AM 102 37 - 153 U/L Final     AST (SGOT)   Date/Time Value Ref Range Status   01/05/2023 08:08 AM 21 10 - 35 U/L Final     ALT   Date/Time Value Ref Range Status   01/05/2023 08:08 AM 15 6 - 29 U/L Final     Anion Gap   Date/Time Value Ref Range Status   05/12/2023 02:27 PM 11.0 5.0 - 15.0 Final     Comment:     Calculated Anion Gap = Na - (  Cl + CO2)  Interpret with caution; calculated Anion Gap may not reflect patient's true clinical status.     This is a calculated value and platform-dependent. A value >12.0 has been recommended for the management of Hyperglycemic Crises: Diabetic Ketoacidosis and Hyperglycemic Hyperosmolar State.Med Clin [place]. 2017;101(3):587-606.doi,10.1016/j.[email or web address]   01/23/2022 09:45 AM 4.0 (L) 5.0 - 15.0 Final     Comment:     Calculated AGAP = Na - (CL + CO2)  Interpret with caution; calculated AGAP may not  reflect patient's true clinical status.  This is a calculated value and  platform-dependent.  A value >12.0 has been recommended for the management of  Hyperglycemic Crises: Diabetic Ketoacidosis and Hyperglycemic  Hyperosmolar State. Med Clin [place] 2017;101(3):587-606.  doi:10.1016/j.[email or web address]         CBC:   WBC   Date/Time Value Ref Range Status   01/05/2023 08:08 AM 3.6 (L) 3.8 - 10.8 Thousand/uL Final   06/30/2009 04:03 PM 9.12 3.50 - 10.80 /CUMM Final     RBC   Date/Time Value Ref Range Status   01/05/2023 08:08 AM 4.85 3.80 - 5.10 Million/uL Final     Hemoglobin   Date/Time Value Ref Range Status   01/05/2023 08:08 AM 13.8 11.7 - 15.5 g/dL Final     Hematocrit   Date/Time Value Ref Range Status   01/05/2023 08:08 AM 43.6 35.0 - 45.0 % Final     MCV   Date/Time Value Ref Range Status   01/05/2023 08:08 AM 89.9 80.0 - 100.0 fL Final     MCHC   Date/Time Value Ref Range Status   01/05/2023 08:08 AM 31.7 (L) 32.0 - 36.0 g/dL Final     RDW   Date/Time Value Ref Range Status   01/05/2023 08:08 AM 13.7 11.0 - 15.0 % Final     Platelets   Date/Time Value Ref Range Status   01/05/2023 08:08 AM 261 140 - 400 Thousand/uL Final   08/16/2018 07:34 AM 252 140 - 400 Thousand/uL Final           Impression / plan    Hypertension: Intolerant to many medications since gastric bypass surgery.  Will discontinue hydrochlorothiazide .  Continue atenolol  100 mg daily and losartan  50 mg twice a day.      Follow-up with cardiology in 6 months      Katheren JAYSON Sharps, MD  12/07/2023

## 2023-12-07 NOTE — Progress Notes (Signed)
 [facility]  Cardiovascular Diseases Established Patient Visit      Name: Jasmine Hunt  Primary Care Physician: Lonni PARAS. Adrig, PA-C     DOB: [birth year]   Referring Physician: No ref. provider found  MRN: [id number]  Date of Service: 12/30/2023     ASSESSMENT/PLAN:   1. Abnormal electrocardiogram (ECG) (EKG) (Primary):  Sinus tachycardia but there is reverse R wave progression between leads V2 and V3 which is probably not significant but could not involve a previous infarction.  Echo shows normal LV systolic function with an ejection fraction of 62%.  No regional wall motion abnormalities.     2. Sinus tachycardia:  Start metoprolol succinate 25 mg p.o. daily and assess the results.       3. Essential (primary) hypertension:  Continue amlodipine 10 mg p.o. daily.  Also to start metoprolol succinate 25 mg p.o. daily predominantly for tachycardia but this should also help with blood pressure.  Weight loss and exercise will also help.     4. Dyslipidemia:  Diet, exercise and atorvastatin.    4.  Mild to moderate mitral regurgitation:  Watch this over time but no indication for any intervention currently.    Disposition:  Return in about 2 months (around 02/27/2024) for with Dr. Irven.    HISTORY:   Source of History:  the patient, chart review.  Chief Complaint:   Tachycardia (1 month follow up, HTN) and Results (Echo)    History of Present Illness:  Jasmine Hunt is a 73 y.o. female is seen in the clinic for a follow up.  Jasmine Hunt is doing well but her chief concern is that of tachycardia although she admits to some anxiety.  Recent echo shows an EF of 62% with normal wall motion.  She does have mild to moderate mitral insufficiency and minimal pulmonary hypertension with an estimated PA systolic pressure of 35 mmHg.  A recent Zio patch shows an average heart rate of 93 bpm but was otherwise unremarkable.    ROS:   General: Negative  Cardiovascular: Negative  Respiratory:  Negative  Hematologic: Negative  Neurologic: Negative    Current Medications:  Current Outpatient Medications (Cardiac Medications)   Medication Sig   . amLODIPine (NORVASC) 5 mg tablet Take 1 tablet by mouth Daily. (Patient taking differently: Take 1 tablet by mouth Daily.)   . atorvaSTATin (LIPITOR) 20 mg tablet Take 1 tablet by mouth at bedtime at bedtime..   . metoprolol tartrate (LOPRESSOR) 25 mg tablet Take 1 tablet by mouth 2 times daily.     Current Outpatient Medications (Other)   Medication Sig   . albuterol 90 mcg/puff inhaler inhale 2 puffs by mouth and INTO THE LUNGS four times a day if needed for DYSPNEA   . cholecalciferol (D2000 ULTRA STRENGTH) 2,000 units capsule Take 1 capsule by mouth.   . escitalopram (LEXAPRO) 5 MG tablet Take 1 tablet by mouth Daily. (Patient not taking: Reported on 12/30/2023.)   . mometasone (NASONEX) 50 mcg/nasal spray 2 sprays by Both nostrils route.   . Multiple Vitamin (MULTI-VITAMIN) TABS Take 1 tablet by mouth Daily.   SABRA OMEPRAZOLE-SODIUM BICARBONATE PO Take  by mouth as needed.      Allergies:  Allergies   Allergen Reactions   . Lisinopril Cough and Other (See Comments)      Reviewed and updated this visit:   Allergies  Meds                OBJECTIVE:  BP 136/84   Pulse 100   Temp 36.8 C (98.2 F) (Temporal)   Resp 16   Ht 1.626 m (5' 4)   Wt 76.7 kg (169 lb 3.2 oz)   SpO2 94%   BMI 29.04 kg/m   BMI (calculated): 29.1  Physical Exam:  Constitutional:    The patient is well developed, well-nourished  No acute distress, conversant  ENMT:   Oral mucosa is normal without pallor or cyanosis  Eyes:   Conjunctivae are anicteric and not-injected No xanthelasma  Respiratory:    Respiratory effort normal  no rales, rhonchi or wheezing   Chest wall:  Normal  Cardiovascular:    Regular rate and rhythm, normal S1 and S2, no S3 or S4., 2/6 holosystolic murmur heard loudest at apex, No Diastolic Murmur., No rubs  Jugular venous distention, 8 cm above the right atrium  no  edema  Carotid arteries 2+ no bruit  Abdominal aorta is not enlarged, no abdominal bruit  Radial pulses are 2+ bilaterally  Femoral pulses are 2+ bilaterally  Pedal pulses are 2+ bilaterally  Abdomen:   soft, non-tender; bowel sounds normal; no masses,  No hepatosplenomegaly  Extremities:   extremities normal, no clubbing, no cyanosis  MSK:   No kyphosis  No scoliosis  Skin:    No rashes, no jaundice  No changes of chronic venous stasis  No cyanosis.   No xanthomas  Neuro/Psych:    normal without focal findings,   mental status, speech normal, alert and oriented x3  Insight and Judgement normal  Euthymic    Data Reviewed at Today's Visit   Record Review encounter date: 12/30/2023    Diagnostic Tests:  NA, K, CO2, BUN & Creatinine:  Lab Results   Component Value Date    NA 138 12/17/2020    NA 138 07/29/2020    NA 136 01/15/2020    K 4.2 12/17/2020    K 3.9 07/29/2020    K 3.9 01/15/2020    CO2 28 12/17/2020    CO2 24 07/29/2020    CO2 24 01/15/2020    BUN 17 12/17/2020    BUN 17 07/29/2020    BUN 19 01/15/2020    CREA 0.78 12/17/2020    CREA 0.79 07/29/2020    CREA 0.75 01/15/2020        WBC, RBC, HGB, HCT & Platelet:  Lab Results   Component Value Date    WBC 6.1 12/17/2020    WBC 6.2 07/29/2020    WBC 5.7 01/15/2020    RBC 5.15 12/17/2020    RBC 4.64 07/29/2020    RBC 4.77 01/15/2020    HGB 14.1 12/17/2020    HGB 13.1 07/29/2020    HGB 13.2 01/15/2020    HCT 43.3 12/17/2020    HCT 38.9 07/29/2020    HCT 40.6 01/15/2020    PLT 336 12/17/2020    PLT 365 07/29/2020    PLT 333 01/15/2020      Thyroid :  No results found for: TSH, FT3, FT4  Cholesterol:  No results found for: CHOL, HDL, LDL, TRIG, CHOLHDL  Hemoglobin A1C:   No results found for: HBA1C  Natriuretic Peptide Tests:   No results found for: NTPROBNP  No results found for: BNP  Digoxin  No results found for: DATELASTDOSE, TIMELASTDOSE, DIGOXIN         Cardiology Studies:   11/18/2023 ECHO   . Normal LV size with normal systolic  function. The ejection fraction is 62%. Indeterminate diastolic  dysfunction. There is no left ventricular hypertrophy.  . Normal right ventricular size with normal function.  . Normal, trileaflet aortic valve.  . The mitral valve is thickened. There is mild to moderate mitral regurgitation. There is mild mitral stenosis.  . Mildly increased pulmonary artery systolic pressure (PASP= 35 mmHg), assuming RAP of 8 mmHg.  SABRA There is no prior for comparison.    Hospitalized or in the ED:  No     Historical data   Last Visit in Dept: 11/16/2023 Jasmine LELON Polite, MD    10/25/2023 ECG   Sinus Rhythm   WITHIN NORMAL LIMITS     11/22/2021 CT Angiogram Chest   CHEST WALL AND LOWER NECK: Postsurgical changes in the bilateral breast soft tissues, right greater than left.  AXILLA: Metallic clip within a prominent right axillary lymph node, measuring up to 1.2 cm in short axis dimension.  HILA AND MEDIASTINUM: No pathologic adenopathy.  HEART AND PERICARDIUM: Normal.  AORTA / PULMONARY VESSELS: No pulmonary embolism. Normal caliber thoracic aorta, containing minimal calcified atherosclerotic plaque  LUNGS, LARGE AIRWAYS, PLEURA: Minimal linear atelectasis or scarring in the lingula and medial lung bases. No focal consolidation, pleural effusion or pneumothorax.SABRA  BONES:  Multilevel degenerative changes of the imaged spine.  UPPER ABDOMEN:  Normal.    -------------------------------      Jasmine LELON Polite, MD    Copy to :  Lonni PARAS. Adrig, PA-C

## 2023-12-11 ENCOUNTER — Encounter (INDEPENDENT_AMBULATORY_CARE_PROVIDER_SITE_OTHER): Payer: Self-pay

## 2023-12-21 ENCOUNTER — Ambulatory Visit
Admission: RE | Admit: 2023-12-21 | Discharge: 2023-12-21 | Disposition: A | Discharge status: Home / Self Care | Payer: Medicare PPO | Attending: Diagnostic Radiology | Admitting: Diagnostic Radiology

## 2023-12-21 ENCOUNTER — Encounter (HOSPITAL_BASED_OUTPATIENT_CLINIC_OR_DEPARTMENT_OTHER): Payer: Self-pay

## 2023-12-21 DIAGNOSIS — C50912 Malignant neoplasm of unspecified site of left female breast: Secondary | ICD-10-CM | POA: Insufficient documentation

## 2023-12-21 DIAGNOSIS — Z1501 Genetic susceptibility to malignant neoplasm of breast: Secondary | ICD-10-CM | POA: Insufficient documentation

## 2023-12-21 DIAGNOSIS — C50911 Malignant neoplasm of unspecified site of right female breast: Secondary | ICD-10-CM | POA: Insufficient documentation

## 2023-12-21 DIAGNOSIS — Z1231 Encounter for screening mammogram for malignant neoplasm of breast: Secondary | ICD-10-CM | POA: Insufficient documentation

## 2023-12-21 DIAGNOSIS — Z1509 Genetic susceptibility to other malignant neoplasm: Secondary | ICD-10-CM | POA: Insufficient documentation

## 2023-12-27 ENCOUNTER — Encounter (INDEPENDENT_AMBULATORY_CARE_PROVIDER_SITE_OTHER): Payer: Self-pay

## 2023-12-31 NOTE — Progress Notes (Signed)
 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  Presents today for routine follow-up.       ASSESSMENT/PLAN  73 year old female with:    #. Hx bilateral breast cancer and BRCA1 mutation positive.  Today 01/03/2024 pt is clinically stable*** and feeling well, no s/s concerning for disease progression based on H&P.  Most recent screening mammogram from 12/21/23 was benign. Continue with surveillance.     #. BRCA1 mutation positive. S/p bilateral salpingo-oophorectomy.   - High risk breast cancer surveillance with yearly screening mammogram alternating with yearly bilateral screening MRI.  -- Last screening breast MRI on 07/17/23 with benign findings.  -- Screening mammogram done 12/21/23 was stable and benign.   -- Due for screening breast MRI in September 2025 (order placed this visit); screening mammogram in Februay 2026    - Pancreatic cancer surveillance screening. family history. Brca1 +. Patient follows Dr [name]. MRI alternating with endoscopy every other year.  -- 06/22/23 EUS: showed stable small (4mm) cystic lesion in pancreatic head w/o solid mass or main duct involvement.  No other significant pathology identified.    -- Due for MRI approx Aug 2025; repeat EUS 2026      #. 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  Etiology unclear, certainly could be sequelae from prior chemotherapy.  Pt denies hx of diabetes, though says she has been "borderline."  Advised her to discuss neuropathy further with her PCP, as well.  Discussed supportive care options (ie acupuncture, massage, topical creams).  There is no associated pain, so adding something like gabapentin would likely be of little benefit.    ***    Follow-up:  RTC 6 months***  with MD for breast exam; f/u sooner if clinically indicated      ----------    CURRENT TREATMENT  Surveillance    INTERVAL HISTORY  Last seen 08/31/23.  ***    ***Generally speaking, feeling well.  No new complaints.  No concerns re: either breast.    Denies pain.  Energy level is good.    Endorses mild numbness and tingling to her toes on both feet at the end of the day, no associated pain, does not impact ambulation.  Says this has been present for multiple years.  Denies hx of diabetes.    ONCOLOGIC HISTORY (copied from prior notes)  Patient has been followed by Dr. [name] at [facility] but she just retired. 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 Unfortunately I do not have all the details about her cancers including grade. Looks like initially patient was tested negative for the genetic mutation but then she was referred to [place] [facility] [place] and looks like she turned out to be BRCA1 positive. She has extensive history of cancer in her family as above including father with prostate, mother with pancreatic, sister with pancreatic and breast and another sister with breast cancer x2. Looks like her brother who does not have  cancer just test positive. Patient also developed left breast carcinoma in 2006 and it was a stage I and she underwent 6 cycles of CMF chemotherapy followed by radiation therapy. Also in 2006 she underwent bilateral salpingo-oophorectomy and for the last few years she has been following with GI in [place] and having EGD down to the pancreas. She also had MRI of the pancreas in September 2021 that was unremarkable. Overall patient is doing well and she denies any  problems specifically she denies any issues with her breasts, she has no abdominal pain or any symptoms.    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)    BRCA1 gene mutation positive       PAST SURGICAL HISTORY    Past Surgical History:   Procedure Laterality Date    BREAST BIOPSY Bilateral     2001, 2006    BREAST BIOPSY Left 01/19/2019    US guided    BREAST LUMPECTOMY      2001 & 2006    HYSTERECTOMY  2008    SHOULDER SURGERY  2015     fff  FAMILY HISTORY    Family History       Problem (# of Occurrences) Relation (Name,Age of Onset)    Breast Cancer (3) Mother (8), Sister Marisue Ivan Vigil), Sister    BRCA Mutation (1) Sister Marisue Ivan Vigil)        Strong family history with numerous family members with BRCA 1 positivity    SOCIAL HISTORY    Social History     Social History Narrative    Not on file           ALLERGIES    Review of patient's allergies indicates:  Allergies   Allergen Reactions    Dust Mite Mixed Allergen Ext [Mite (D. Farinae)] Unknown     Allergy test came back positive for dust, reaction is unknown.    Lisinopril Other         MEDICATIONS      Current Outpatient Medications:     amLODIPine 5 MG tablet, Take 5 mg by mouth daily., Disp: , Rfl:     atorvastatin 20 MG tablet, Take 20 mg by mouth at bedtime., Disp: , Rfl:     cholecalciferol 50 mcg (2,000 unit) capsule, Take 2,000 units by mouth daily., Disp: , Rfl:     Coenzyme Q10 (COQ-10 OR), Take 1 tablet by mouth as needed., Disp: , Rfl:     Cyanocobalamin (VITAMIN B-12 OR), Take 1 tablet by mouth as needed., Disp: , Rfl:     mometasone 50 MCG/ACT nasal spray, Spray 2 sprays in the nostril as needed., Disp: , Rfl:     Multiple Vitamin (Multi-Vitamin) tablet, Take 1 tablet by mouth daily., Disp: , Rfl:     Omega-3 Fatty Acids (OMEGA  3 OR), Take 1 tablet by mouth as needed., Disp: , Rfl:     OMEPRAZOLE OR, Take 1 tablet by mouth as needed., Disp: , Rfl:     polyethylene glycol 3350 with electrolytes (Golytely) 236 g solution, Take per instructions already provided to patient for bowel prep., Disp: 4000 mL, Rfl: 0         PHYSICAL EXAM    There were no vitals taken  for this visit. ***  ECOG: 0  General: Pleasant, well-appearing. No acute distress. ***  HEENT: Sclerae anicteric.   Lungs: Normal respiratory effort.  Breast: Well-healed scars over both breasts.  No concerning skin or nipple changes.  No palpable masses or axillary adenopathy bilaterally. ***  Neuro: Alert and oriented x4. Grossly nonfocal.      ----------    Woodroe Chen, DNP  Nurse Practitioner  Medical Oncology, [facility]    Time Statement: I spent a total of *** minutes for the patient's care on the date of service.

## 2024-01-03 ENCOUNTER — Ambulatory Visit: Payer: Medicare PPO | Attending: Nurse Practitioner | Admitting: Nurse Practitioner

## 2024-01-03 VITALS — BP 127/82 | HR 102 | Temp 98.2°F | Resp 17 | Wt 170.8 lb

## 2024-01-03 DIAGNOSIS — C50912 Malignant neoplasm of unspecified site of left female breast: Secondary | ICD-10-CM

## 2024-01-03 DIAGNOSIS — Z08 Encounter for follow-up examination after completed treatment for malignant neoplasm: Secondary | ICD-10-CM | POA: Insufficient documentation

## 2024-01-03 DIAGNOSIS — Z1231 Encounter for screening mammogram for malignant neoplasm of breast: Secondary | ICD-10-CM | POA: Insufficient documentation

## 2024-01-03 DIAGNOSIS — Z1501 Genetic susceptibility to malignant neoplasm of breast: Secondary | ICD-10-CM | POA: Insufficient documentation

## 2024-01-03 DIAGNOSIS — Z853 Personal history of malignant neoplasm of breast: Secondary | ICD-10-CM | POA: Insufficient documentation

## 2024-01-03 DIAGNOSIS — Z1509 Genetic susceptibility to other malignant neoplasm: Secondary | ICD-10-CM | POA: Insufficient documentation

## 2024-01-03 DIAGNOSIS — C50911 Malignant neoplasm of unspecified site of right female breast: Secondary | ICD-10-CM

## 2024-01-08 ENCOUNTER — Encounter (INDEPENDENT_AMBULATORY_CARE_PROVIDER_SITE_OTHER): Payer: Self-pay

## 2024-01-31 ENCOUNTER — Encounter (HOSPITAL_BASED_OUTPATIENT_CLINIC_OR_DEPARTMENT_OTHER): Payer: Self-pay

## 2024-01-31 ENCOUNTER — Other Ambulatory Visit (INDEPENDENT_AMBULATORY_CARE_PROVIDER_SITE_OTHER): Payer: Self-pay | Admitting: Internal Medicine

## 2024-01-31 DIAGNOSIS — I10 Essential (primary) hypertension: Secondary | ICD-10-CM

## 2024-01-31 DIAGNOSIS — R0989 Other specified symptoms and signs involving the circulatory and respiratory systems: Secondary | ICD-10-CM

## 2024-01-31 NOTE — Telephone Encounter (Signed)
 12/07/23 Dr Roslynn Coombes discontinued hydrochlorothiazide. PCP to review requested refill on med.

## 2024-02-08 ENCOUNTER — Encounter (INDEPENDENT_AMBULATORY_CARE_PROVIDER_SITE_OTHER): Payer: Self-pay

## 2024-02-15 ENCOUNTER — Ambulatory Visit (INDEPENDENT_AMBULATORY_CARE_PROVIDER_SITE_OTHER): Payer: Medicare Other | Admitting: Internal Medicine

## 2024-02-15 ENCOUNTER — Encounter (INDEPENDENT_AMBULATORY_CARE_PROVIDER_SITE_OTHER): Payer: Self-pay | Admitting: Internal Medicine

## 2024-02-15 VITALS — BP 160/92 | HR 61 | Temp 98.0°F | Ht 61.0 in | Wt 167.0 lb

## 2024-02-15 DIAGNOSIS — I10 Essential (primary) hypertension: Secondary | ICD-10-CM

## 2024-02-15 DIAGNOSIS — E7800 Pure hypercholesterolemia, unspecified: Secondary | ICD-10-CM

## 2024-02-15 DIAGNOSIS — Z23 Encounter for immunization: Secondary | ICD-10-CM

## 2024-02-15 DIAGNOSIS — K219 Gastro-esophageal reflux disease without esophagitis: Secondary | ICD-10-CM

## 2024-02-15 DIAGNOSIS — Z79899 Other long term (current) drug therapy: Secondary | ICD-10-CM

## 2024-02-15 DIAGNOSIS — Z9884 Bariatric surgery status: Secondary | ICD-10-CM

## 2024-02-15 DIAGNOSIS — E559 Vitamin D deficiency, unspecified: Secondary | ICD-10-CM

## 2024-02-15 DIAGNOSIS — E213 Hyperparathyroidism, unspecified: Secondary | ICD-10-CM

## 2024-02-15 DIAGNOSIS — E538 Deficiency of other specified B group vitamins: Secondary | ICD-10-CM | POA: Insufficient documentation

## 2024-02-15 DIAGNOSIS — R0989 Other specified symptoms and signs involving the circulatory and respiratory systems: Secondary | ICD-10-CM

## 2024-02-15 LAB — COMPREHENSIVE METABOLIC PANEL
ALT: 16 U/L (ref <=55)
AST (SGOT): 22 U/L (ref <=41)
Albumin/Globulin Ratio: 1.3 (ref 0.9–2.2)
Albumin: 3.8 g/dL (ref 3.5–5.0)
Alkaline Phosphatase: 78 U/L (ref 37–117)
Anion Gap: 15 (ref 5.0–15.0)
BUN: 11 mg/dL (ref 7–21)
Bilirubin, Total: 0.4 mg/dL (ref 0.2–1.2)
CO2: 19 meq/L (ref 17–29)
Calcium: 8.8 mg/dL (ref 7.9–10.2)
Chloride: 110 meq/L (ref 99–111)
Creatinine: 0.6 mg/dL (ref 0.4–1.0)
GFR: >60 mL/min/{1.73_m2} (ref >=60.0)
Globulin: 2.9 g/dL (ref 2.0–3.6)
Glucose: 83 mg/dL (ref 70–100)
Hemolysis Index: 5 {index}
Potassium: 4.1 meq/L (ref 3.5–5.3)
Protein, Total: 6.7 g/dL (ref 6.0–8.3)
Sodium: 144 meq/L (ref 135–145)

## 2024-02-15 LAB — PTH, INTACT: PTH Intact: 109.1 pg/mL — ABNORMAL HIGH (ref 18.0–89.0)

## 2024-02-15 LAB — LAB USE ONLY - CBC WITH DIFFERENTIAL
Absolute Basophils: 0.03 10*3/uL (ref 0.00–0.08)
Absolute Eosinophils: 0.03 10*3/uL (ref 0.00–0.44)
Absolute Immature Granulocytes: 0.01 10*3/uL (ref 0.00–0.07)
Absolute Lymphocytes: 0.92 10*3/uL (ref 0.42–3.22)
Absolute Monocytes: 0.39 10*3/uL (ref 0.21–0.85)
Absolute Neutrophils: 2.77 10*3/uL (ref 1.10–6.33)
Absolute nRBC: 0 10*3/uL (ref <=0.00)
Basophils %: 0.7 %
Eosinophils %: 0.7 %
Hematocrit: 42.6 % (ref 34.7–43.7)
Hemoglobin: 13.5 g/dL (ref 11.4–14.8)
Immature Granulocytes %: 0.2 %
Lymphocytes %: 22.2 %
MCH: 29.3 pg (ref 25.1–33.5)
MCHC: 31.7 g/dL (ref 31.5–35.8)
MCV: 92.6 fL (ref 78.0–96.0)
MPV: 9.8 fL (ref 8.9–12.5)
Monocytes %: 9.4 %
Neutrophils %: 66.8 %
Platelet Count: 238 10*3/uL (ref 142–346)
Preliminary Absolute Neutrophil Count: 2.77 10*3/uL (ref 1.10–6.33)
RBC: 4.6 10*6/uL (ref 3.90–5.10)
RDW: 14 % (ref 11–15)
WBC: 4.15 10*3/uL (ref 3.10–9.50)
nRBC %: 0 /100{WBCs} (ref <=0.0)

## 2024-02-15 LAB — MAGNESIUM: Magnesium: 2.4 mg/dL (ref 1.6–2.6)

## 2024-02-15 LAB — FOLATE
Folate: 7.4 ng/mL (ref >5.4)
Hemolysis Index: 3 {index}

## 2024-02-15 LAB — VITAMIN D, 25 OH, TOTAL: Vitamin D 25-OH, Total: 5 ng/mL — ABNORMAL LOW (ref 30–100)

## 2024-02-15 LAB — LIPID PANEL
Cholesterol / HDL Ratio: 2.7 {index}
Cholesterol: 203 mg/dL — ABNORMAL HIGH (ref <=199)
HDL: 76 mg/dL (ref >=40)
LDL Calculated: 116 mg/dL — ABNORMAL HIGH (ref 0–99)
Triglycerides: 53 mg/dL (ref 34–149)
VLDL Calculated: 11 mg/dL (ref 10–40)

## 2024-02-15 LAB — IRON PROFILE
Iron Saturation: 32 % (ref 15–50)
Iron: 96 ug/dL (ref 32–157)
TIBC: 303 ug/dL (ref 265–497)
UIBC: 207 ug/dL (ref 126–382)

## 2024-02-15 LAB — FERRITIN: Ferritin: 169.2 ng/mL (ref 4.60–204.00)

## 2024-02-15 MED ORDER — PANTOPRAZOLE SODIUM 40 MG PO TBEC
40.0000 mg | DELAYED_RELEASE_TABLET | Freq: Every day | ORAL | 1 refills | Status: DC
Start: 2024-02-15 — End: 2024-08-16

## 2024-02-15 NOTE — Progress Notes (Signed)
 Have you seen any specialists/other providers since your last visit with us ?    Urgent Care     The patient is due for:   Health Maintenance Due   Topic Date Due    Tetanus Ten-Year  02/25/2009    Medicare Annual Wellness Visit  03/28/2020    COVID-19 Vaccine (5 - 2024-25 season) 07/04/2023    Mammogram  07/31/2023

## 2024-02-15 NOTE — Progress Notes (Signed)
 Subjective:      Patient ID: Amber [name] is a 73 y.o. female     Chief Complaint   Patient presents with    Hypertension    Hyperlipidemia    Dizziness        HPI Pt is here for follow up following problems:     Left leg pain, no burning,   Ortho changed gabapentin  to lyrica, which pt has not started.     HTN, fluctuating, 130-200's/80-100's  No cp, no sob,   Currently has not restarted HCTZ  Managed via card.     GERD, takes med daily, no dysphagia, prefers pantoprazole .     Vitamin D  def, not taking supplement as rx'd    Hyperparathyroidism, could be due to vitamin D  def,  Normal calcium     B12 def, getting injection monthly via heme/onc  Lab Results   Component Value Date    B12 623 01/05/2023          The following sections were reviewed this encounter by the provider:   Tobacco  Allergies  Meds  Problems  Med Hx  Surg Hx  Fam Hx         Review of Systems   Constitutional:  Negative for appetite change, chills, fever and unexpected weight change.   Respiratory:  Negative for shortness of breath.    Cardiovascular:  Negative for chest pain and palpitations.   Gastrointestinal:  Negative for abdominal pain, nausea and vomiting.   Genitourinary:  Negative for dysuria.   Musculoskeletal:  Positive for arthralgias. Negative for myalgias.   Neurological:  Negative for syncope, light-headedness and headaches.          BP (!) 160/92   Pulse 61   Temp 98 F (36.7 C)   Ht 1.549 m (5\' 1" )   Wt 75.8 kg (167 lb)   LMP  (LMP Unknown)   BMI 31.55 kg/m     Objective:     Physical Exam  Vitals reviewed.   Constitutional:       General: She is not in acute distress.     Appearance: She is well-developed. She is obese.   Eyes:      General: No scleral icterus.     Conjunctiva/sclera: Conjunctivae normal.   Neck:      Thyroid : No thyromegaly.      Vascular: No carotid bruit or JVD.   Cardiovascular:      Rate and Rhythm: Normal rate and regular rhythm.      Heart sounds: Normal heart sounds. No murmur heard.      No friction rub. No gallop.   Pulmonary:      Effort: Pulmonary effort is normal. No respiratory distress.      Breath sounds: Normal breath sounds. No rales.   Abdominal:      General: Bowel sounds are normal. There is no distension.      Palpations: Abdomen is soft.      Tenderness: There is no abdominal tenderness. There is no guarding or rebound.   Musculoskeletal:      Cervical back: Neck supple.      Right lower leg: No edema.      Left lower leg: No edema.   Neurological:      Mental Status: She is alert.          Assessment:     1. Essential hypertension  - CBC with Differential (Order); Future  - Comprehensive Metabolic Panel; Future  - CBC  with Differential (Order)  - Comprehensive Metabolic Panel    2. Need for vaccination    3. Labile hypertension    4. Vitamin D  deficiency  - Vitamin D , 25 OH, Total; Future  - Vitamin D , 25 OH, Total    5. Gastroesophageal reflux disease, unspecified whether esophagitis present  - pantoprazole  (PROTONIX ) 40 MG tablet; Take 1 tablet (40 mg) by mouth daily  Dispense: 90 tablet; Refill: 1  - Magnesium ; Future  - Magnesium     6. S/P gastric bypass  - Folate; Future  - Vitamin A ; Future  - Whole Blood Vitamin B1 (Thiamine); Future  - Copper ; Future  - Zinc ; Future  - Ferritin; Future  - Iron Profile; Future  - Folate  - Vitamin A   - Whole Blood Vitamin B1 (Thiamine)  - Copper   - Zinc   - Ferritin  - Iron Profile    7. Hyperparathyroidism  - PTH, Intact; Future  - PTH, Intact    8. Medication management  - Magnesium ; Future  - Magnesium     9. B12 deficiency    10. Hypercholesterolemia  - Comprehensive Metabolic Panel; Future  - Lipid Panel; Future  - Comprehensive Metabolic Panel  - Lipid Panel        Plan:     Labs ordered  Continue meds  Change omeprazole  to pantoprazole   Low carb, low chol diet, weight reduction   Monitor home BP  Keep f/u with specialists as directed  Further recommendations pending test results.  F/u 6 months.       Lucina Sabal, MD

## 2024-02-17 ENCOUNTER — Ambulatory Visit (INDEPENDENT_AMBULATORY_CARE_PROVIDER_SITE_OTHER)

## 2024-02-17 ENCOUNTER — Encounter (INDEPENDENT_AMBULATORY_CARE_PROVIDER_SITE_OTHER): Payer: Self-pay

## 2024-02-17 VITALS — BP 162/71 | HR 55 | Ht 61.0 in | Wt 168.0 lb

## 2024-02-17 DIAGNOSIS — E7800 Pure hypercholesterolemia, unspecified: Secondary | ICD-10-CM

## 2024-02-17 DIAGNOSIS — I10 Essential (primary) hypertension: Secondary | ICD-10-CM

## 2024-02-17 LAB — COPPER: Copper: 109 ug/dL (ref 70–175)

## 2024-02-17 LAB — ZINC: Zinc: 57 ug/dL — ABNORMAL LOW (ref 60–130)

## 2024-02-17 MED ORDER — HYDROCHLOROTHIAZIDE 25 MG PO TABS
12.5000 mg | ORAL_TABLET | ORAL | 3 refills | Status: DC
Start: 2024-02-17 — End: 2024-06-21

## 2024-02-17 NOTE — Progress Notes (Signed)
  CARDIOLOGY OFFICE VISIT    HISTORY OF PRESENT ILLNESS:  I had the pleasure of seeing Amber [name] today in for evaluation of hypertension.    Amber [name] is a 73 y.o. female with a past medical history significant for hypertension, hyperlipidemia.  Patient follows Dr. Felipe Horton, [place], last OV 12/07/2023.  During that visit her hydrochlorothiazide  was discontinued as she had reported dehydration, intermittent dizziness.  Based on previous notes patient apparently was intolerant to clonidine  oral and patch, nifedipine  (coughing), nausea with amlodipine  and lisinopril .    At today's visit Amber [name] is doing well. She does endorse chronic knee and ankle pain that may be contributing to her BP rising.  She denies chest pain, shortness of breath, palpitations, presyncope, or syncope.       ASSESSMENT & PLAN:   1. Primary hypertension        2. Hypercholesterolemia             #HTN  Elevated this visit. Patient reports since the discontinuance of her HCTZ it has been lately elevated in the 160-170s  - She has been compliant with atenolol  and Losartan   - Will restart HCTZ at 12.5mg  every other day to prevent dehydration  -Counseled patient on hydration, sodium and diet restriction._  - Educated on checking the BP and weight everyday and call with any concerns    #Hx of CVA  -On ASA    Return in about 4 months (around 06/18/2024).    Cardiographics:  TTE(04/2023):    CONCLUSIONS    * Left ventricular systolic function is normal with an ejection fraction of 65 % by Simpson's biplane.     * Right ventricular systolic function is normal.     * There is mild diffuse thickening (sclerosis) of the aortic valve cusps.     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    Labile hypertension 07/24/2019    Allergic rhinitis 11/28/2018    Chronic frontal sinusitis 11/28/2018    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    Primary osteoarthritis of both knees 11/28/2018    Small bowel obstruction due to adhesions (CMS/HCC) 09/01/2018    Other dysphagia 08/26/2018    Encounter for vitamin deficiency screening 08/10/2018    Nausea 08/10/2018    Epigastric abdominal tenderness without rebound tenderness 09/08/2016    Anxiety 06/25/2016    Nutritional deficiency 04/17/2016    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    Hiatal hernia 09/20/2015    Osteoarthritis of knees, bilateral 09/20/2015    Headache 07/12/2015    Dyslipidemia 03/21/2015    Palpitations 02/12/2015    Transient cerebral ischemia, unspecified type 11/07/2014    S/P laparoscopic cholecystectomy 08/27/2014    Other long term (current) drug therapy 08/12/2014    Slow transit constipation 07/27/2014    Obstructive sleep apnea syndrome 05/01/2014    Anxiety state 03/01/2014    Vitamin D  deficiency 02/19/2014    Overactive bladder 02/06/2014  Abnormal electrocardiogram 01/08/2014    Essential hypertension 11/21/2013    History of spinal surgery 11/21/2013    Hypernatremia 11/11/2013    Paresthesia 11/11/2013    Cerebral infarction (CMS/HCC) 04/15/2013    Gastroesophageal reflux disease 04/02/2013    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    PHYSICAL EXAMINATION   General Appearance: A well-appearing female in no acute distress.   Vital Signs: BP 162/71 (BP Site: Left arm, Patient Position: Sitting, Cuff Size: Large)   Pulse (!) 55   Ht 1.549 m (5'  1")   Wt 76.2 kg (168 lb)   LMP  (LMP Unknown)   SpO2 98%   BMI 31.74 kg/m    HEENT: Sclera anicteric, conjunctiva without pallor, moist mucous membranes, normal dentition.   Neck: Supple without jugular venous distention. Thyroid  nonpalpable. Normal carotid upstrokes without bruits.  Chest: Clear to auscultation bilaterally with good air movement and respiratory effort and no wheezes, rales, or rhonchi  Cardiovascular: Normal S1 and physiologically split S2 without murmurs, gallops or rub. PMI of normal size and nondisplaced.   Abdomen: Soft, nontender. No organomegaly.  No pulsatile masses or bruits.    Extremities: Warm without edema. All peripheral pulses are full and equal.  Skin: No rash, xanthoma or xanthelasma.   Neuro: Alert and oriented x3. Grossly intact. Strength is symmetrical. Normal mood and affect.       Basic Metabolic Profile   Lab Results   Component Value Date    NA 144 02/15/2024    K 4.1 02/15/2024    BUN 11 02/15/2024    CREAT 0.6 02/15/2024    MG 2.4 02/15/2024    CA 8.8 02/15/2024    GLU 83 02/15/2024         Cardiac Biomarkers   No results found for: "TROPONIN", "BNP"     CBC with Diff   Lab Results   Component Value Date    WBC 4.15 02/15/2024    HGB 13.5 02/15/2024    HCT 42.6 02/15/2024    PLT 238 02/15/2024         Cholesterol Panel   Lab Results   Component Value Date    CHOL 203 (H) 02/15/2024    HDL 76 02/15/2024    LDL 116 (H) 02/15/2024    TRIG 53 02/15/2024         Endocrine   Lab Results   Component Value Date    HGBA1C 4.8 11/25/2020    HGBA1C 5.0 01/25/2018    HGBA1C 5.0 04/30/2017    TSH 1.09 10/04/2019         Coagulation Studies   Lab Results   Component Value Date    INR 1.0 11/25/2020    DDIMER 0.28 01/24/2018         ----------------------------  Dema Filler, CRNP  [facility]   Tel.: [phone], Fax: [phone]  [address], [place], [place] [address]  [address] [place], [place], [place] [address]  [address]  [address], [place], [place] [address]  [address], [address], [place], [place] [address]  _____________________________      Incident to service performed with physician present in the office. The physician's plan of care was implemented.            [  1]   Current Outpatient Medications   Medication Sig Dispense Refill    aspirin  EC 81 MG EC tablet Take 1 tablet (81 mg total) by mouth daily. 30 tablet 1    atenolol  (TENORMIN ) 100 MG tablet Take 1 tablet (100 mg) by mouth daily 100 tablet 1    cefdinir (OMNICEF) 300 MG capsule Take 1 capsule (300 mg) by mouth 2 (two) times daily      Cyanocobalamin  (B-12 IJ) Inject as directed every 30 (thirty) days         diazePAM  (VALIUM ) 5 MG tablet Take 1 tablet (5 mg) by mouth every 8 (eight) hours as needed for Anxiety 30 tablet 2    fluticasone  (FLONASE ) 50 MCG/ACT nasal spray 2 sprays by Nasal route daily. 16 g 2    gabapentin  (NEURONTIN ) 300 MG capsule daily as needed      losartan  (COZAAR ) 50 MG tablet Take 1 tablet (50 mg) by mouth 2 (two) times daily 180 tablet 3    Medrol  4 MG tablet Take 1 tablet (4 mg) by mouth      Multiple Vitamin (MULTIVITAMIN) capsule Take 1 capsule by mouth daily      ondansetron  (ZOFRAN -ODT) 4 MG disintegrating tablet Take 1 tablet (4 mg total) by mouth every 8 (eight) hours as needed for Nausea 30 tablet 2    pantoprazole  (PROTONIX ) 40 MG tablet Take 1 tablet (40 mg) by mouth daily 90 tablet 1    pregabalin (LYRICA) 75 MG capsule Take 1 mg by mouth      valACYclovir  HCL (VALTREX ) 500 MG tablet TAKE 1 TABLET(500 MG) BY MOUTH DAILY. 100 tablet 1    VITAMIN D  PO Take by mouth       No current facility-administered medications for this visit.   [2]   Social History  Tobacco Use    Smoking status: Never    Smokeless tobacco: Never   Vaping Use    Vaping status: Never Used   Substance Use Topics    Alcohol  use: Yes     Comment: occ.    Drug use: Never

## 2024-02-18 LAB — VITAMIN A: Vitamin A: 62 ug/dL (ref 38–98)

## 2024-02-19 ENCOUNTER — Ambulatory Visit (INDEPENDENT_AMBULATORY_CARE_PROVIDER_SITE_OTHER): Payer: Self-pay | Admitting: Internal Medicine

## 2024-02-19 DIAGNOSIS — E559 Vitamin D deficiency, unspecified: Secondary | ICD-10-CM

## 2024-02-19 MED ORDER — VITAMIN D3 1.25 MG (50000 UT) PO CAPS
1.0000 | ORAL_CAPSULE | ORAL | 3 refills | Status: DC
Start: 2024-02-19 — End: 2024-08-16

## 2024-02-19 NOTE — Progress Notes (Signed)
 Zinc  level is borderline low  Vitamin D  level is very low  Parathyroid  level is high, likely due to low vitamin D  level   Cholesterol profile LDL is borderline high, rest of profile is good     Rec:  1. Low carb, low cholesterol, low calorie diet, exercise, weight reduction 2. Resume rx vitamin D   3. Take otc zinc  supplement  4.  Keep follow up visit      Rx vitamin D  sent to pharmacy

## 2024-02-21 LAB — WHOLE BLOOD VITAMIN B1 (THIAMINE): Vitamin B1, Whole Blood: 117 nmol/L (ref 78–185)

## 2024-02-22 ENCOUNTER — Ambulatory Visit

## 2024-02-24 ENCOUNTER — Ambulatory Visit (INDEPENDENT_AMBULATORY_CARE_PROVIDER_SITE_OTHER)

## 2024-03-03 ENCOUNTER — Ambulatory Visit
Admission: RE | Admit: 2024-03-03 | Discharge: 2024-03-03 | Disposition: A | Discharge status: Home / Self Care | Source: Ambulatory Visit | Attending: Internal Medicine | Admitting: Internal Medicine

## 2024-03-05 NOTE — Progress Notes (Signed)
 Zinc  level is borderline low  Vitamin D  level is very low  Parathyroid  level is high, likely due to low vitamin D  level   Cholesterol profile LDL is borderline high, rest of profile is good     Rec:  1. Low carb, low cholesterol, low calorie diet, exercise, weight reduction 2. Resume rx vitamin D   3. Take otc zinc  supplement  4.  Keep follow up visit      Rx vitamin D  sent to pharmacy

## 2024-03-09 ENCOUNTER — Encounter (INDEPENDENT_AMBULATORY_CARE_PROVIDER_SITE_OTHER): Payer: Self-pay

## 2024-04-09 ENCOUNTER — Encounter (INDEPENDENT_AMBULATORY_CARE_PROVIDER_SITE_OTHER): Payer: Self-pay

## 2024-04-18 ENCOUNTER — Telehealth (HOSPITAL_COMMUNITY): Payer: Self-pay

## 2024-04-18 DIAGNOSIS — Z800 Family history of malignant neoplasm of digestive organs: Secondary | ICD-10-CM

## 2024-04-18 DIAGNOSIS — K863 Pseudocyst of pancreas: Secondary | ICD-10-CM

## 2024-04-18 DIAGNOSIS — Z1501 Genetic susceptibility to malignant neoplasm of breast: Secondary | ICD-10-CM

## 2024-04-18 NOTE — Telephone Encounter (Signed)
 MRI abdomen pended to Dr. [name] to review and sign.

## 2024-04-18 NOTE — Telephone Encounter (Signed)
 Received call from patient asking if CT order can be placed. She is on an alternating EUS/CT plan with Dr Zackary. Last EUS completed 06/22/2023.    Routing message to Dr Zackary and supporting staff requesting order.

## 2024-04-19 NOTE — Telephone Encounter (Signed)
 LVM for pt with radiology's phone number to call and schedule MRI for August.     Asked pt to call and let us  know when it's scheduled.

## 2024-04-22 ENCOUNTER — Emergency Department: Payer: Self-pay

## 2024-04-22 ENCOUNTER — Emergency Department

## 2024-04-22 ENCOUNTER — Emergency Department
Admission: EM | Admit: 2024-04-22 | Discharge: 2024-04-22 | Disposition: A | Discharge status: Home / Self Care | Attending: Emergency Medicine | Admitting: Emergency Medicine

## 2024-04-22 DIAGNOSIS — M79652 Pain in left thigh: Secondary | ICD-10-CM | POA: Insufficient documentation

## 2024-04-22 DIAGNOSIS — M5416 Radiculopathy, lumbar region: Secondary | ICD-10-CM | POA: Insufficient documentation

## 2024-04-22 DIAGNOSIS — M79672 Pain in left foot: Secondary | ICD-10-CM | POA: Insufficient documentation

## 2024-04-22 DIAGNOSIS — M79605 Pain in left leg: Secondary | ICD-10-CM

## 2024-04-22 MED ORDER — NALOXONE HCL 4 MG/0.1ML NA LIQD
NASAL | 0 refills | Status: DC
Start: 2024-04-22 — End: 2024-08-16

## 2024-04-22 MED ORDER — ACETAMINOPHEN 500 MG PO TABS
1000.0000 mg | ORAL_TABLET | Freq: Four times a day (QID) | ORAL | 0 refills | Status: DC | PRN
Start: 2024-04-22 — End: 2024-08-16

## 2024-04-22 MED ORDER — DOCUSATE SODIUM 100 MG PO CAPS
100.0000 mg | ORAL_CAPSULE | Freq: Two times a day (BID) | ORAL | 0 refills | Status: AC
Start: 2024-04-22 — End: 2024-04-29

## 2024-04-22 MED ORDER — MORPHINE SULFATE 15 MG PO TABS
15.0000 mg | ORAL_TABLET | ORAL | 0 refills | Status: AC | PRN
Start: 2024-04-22 — End: 2024-04-29

## 2024-04-22 MED ORDER — ACETAMINOPHEN 500 MG PO TABS
1000.0000 mg | ORAL_TABLET | Freq: Once | ORAL | Status: AC
Start: 2024-04-22 — End: 2024-04-22
  Administered 2024-04-22: 1000 mg via ORAL
  Filled 2024-04-22: qty 2

## 2024-04-22 MED ORDER — MELOXICAM 7.5 MG PO TABS
7.5000 mg | ORAL_TABLET | Freq: Every day | ORAL | 0 refills | Status: DC
Start: 2024-04-22 — End: 2024-08-16

## 2024-04-22 MED ORDER — KETOROLAC TROMETHAMINE 30 MG/ML IJ SOLN
30.0000 mg | Freq: Once | INTRAMUSCULAR | Status: AC
Start: 2024-04-22 — End: 2024-04-22
  Administered 2024-04-22: 30 mg via INTRAMUSCULAR
  Filled 2024-04-22: qty 1

## 2024-04-22 NOTE — ED Notes (Signed)
 [facility]  Emergency Department Physician Attestation Note        I performed the substantive portion of the MDM. For the problems addressed, I personally developed, reviewed, and/or approved the plan and assessment as documented by the APP.  Patient was seen by APP, see their note for further documentation of history and exam.    Brief History of Present Illness and Additional Exam Findings     Chief Complaint: Leg Pain and Knee Pain       History per APP    73 y.o. female presents with L leg pain since Sunday.  Saw by her ortho and started on gabapentin  and prednisone , but can't tolerate the prednisone .    Triage Vitals:  ED Triage Vitals [04/22/24 1044]   Encounter Vitals Group      BP 180/88      Systolic BP Percentile       Diastolic BP Percentile       Heart Rate 66      Resp Rate 16      Temp 98 F (36.7 C)      Temp src Oral      SpO2 97 %      Weight 70.8 kg      Height       Head Circumference       Peak Flow       Pain Score 10      Pain Loc       Pain Education       Exclude from [facility] Chart             Medical Decision Making         Patient with sciatica symptoms.  No fractures on CT.  Will treat symptomatically.    Vital Signs: Reviewed the patient's vital signs.   Nursing Notes: Reviewed and utilized available nursing notes.  Medical Records Reviewed: Reviewed available past medical records.    CARDIAC STUDIES    The following cardiac studies were independently interpreted by me the Emergency Medicine Physician.  For full cardiac study results please see chart. I discussed testing results with the APP.      EMERGENCY IMAGING STUDIES    The following imagine studies were independently interpreted by me (emergency medicine physician). I discussed testing results with the APP.      RADIOLOGY IMAGING STUDIES      CT L- Spine without Contrast   Final Result          1.Degenerative changes with lower lumbar foraminal narrowing, as above. No   apparent canal stenosis. MRI could better  evaluate as clinically warranted.   2.No acute fracture.   3.Slight lumbar levocurvature.      Lynwood Artist Gee MD, MD   04/22/2024 11:57 AM          EMERGENCY DEPT. MEDICATIONS      ED Medication Orders (From admission, onward)      Start Ordered     Status Ordering Provider    04/22/24 1133 04/22/24 1132  acetaminophen  (TYLENOL ) tablet 1,000 mg  Once        Route: Oral  Ordered Dose: 1,000 mg       Last MAR action: Given MISSANA, MAE L    04/22/24 1133 04/22/24 1132  ketorolac  (TORADOL ) injection 30 mg  Once        Route: Intramuscular  Ordered Dose: 30 mg       Last MAR action: Given MISSANA, MAE L  LABORATORY RESULTS    Ordered and independently interpreted AVAILABLE laboratory tests.          CRITICAL CARE/PROCEDURES      Diagnosis/Disposition     I (ED Physician) discussed final disposition with the APP    Diagnosis:  Final diagnoses:   Lumbar radiculopathy   Left leg pain       Disposition:  ED Disposition       ED Disposition   Discharge    Condition   --    Date/Time   Sat Apr 22, 2024 12:23 PM    Comment   Amber [name] discharge to home/self care.    Condition at disposition: Stable                 Prescriptions:  Discharge Medication List as of 04/22/2024 12:23 PM        START taking these medications    Details   acetaminophen  (TYLENOL ) 500 MG tablet Take 2 tablets (1,000 mg) by mouth every 6 (six) hours as needed for Pain, Starting Sat 04/22/2024, E-Rx      docusate sodium  (COLACE) 100 MG capsule Take 1 capsule (100 mg) by mouth 2 (two) times daily for 7 days, Starting Sat 04/22/2024, Until Sat 04/29/2024, E-Rx      meloxicam (MOBIC) 7.5 MG tablet Take 1 tablet (7.5 mg) by mouth once daily, Starting Sat 04/22/2024, E-Rx      morphine  (MSIR) 15 MG immediate release tablet Take 1 tablet (15 mg) by mouth every 4 (four) hours as needed for Pain, Starting Sat 04/22/2024, Until Sat 04/29/2024 at 2359, E-Rx      naloxone  (NARCAN ) 4 MG/0.1ML nasal spray 1 spray intranasally. If pt does not respond or  relapses into respiratory depression call 911. Give additional doses every 2-3 min., E-Rx           CONTINUE these medications which have NOT CHANGED    Details   aspirin  EC 81 MG EC tablet Take 1 tablet (81 mg total) by mouth daily., Starting Tue 01/25/2018, Normal      atenolol  (TENORMIN ) 100 MG tablet Take 1 tablet (100 mg) by mouth daily, Starting Tue 08/17/2023, E-Rx      cefdinir (OMNICEF) 300 MG capsule Take 1 capsule (300 mg) by mouth 2 (two) times daily, Historical Med      !! Cholecalciferol (Vitamin D3) 1.25 MG (50000 UT) Cap Take 1 capsule by mouth once a week, Starting Sat 02/19/2024, Normal      Cyanocobalamin  (B-12 IJ) Inject as directed every 30 (thirty) days   , Historical Med      diazePAM  (VALIUM ) 5 MG tablet Take 1 tablet (5 mg) by mouth every 8 (eight) hours as needed for Anxiety, Starting Tue 08/17/2023, E-Rx      fluticasone  (FLONASE ) 50 MCG/ACT nasal spray 2 sprays by Nasal route daily., Starting 01/02/2014, Until Discontinued, Normal      gabapentin  (NEURONTIN ) 300 MG capsule daily as needed, Starting Wed 02/11/2022, Historical Med      hydroCHLOROthiazide  (HYDRODIURIL ) 25 MG tablet Take 0.5 tablets (12.5 mg) by mouth every other day, Starting Thu 02/17/2024, E-Rx      losartan  (COZAAR ) 50 MG tablet Take 1 tablet (50 mg) by mouth 2 (two) times daily, Starting Thu 03/11/2023, E-Rx      Medrol  4 MG tablet Take 1 tablet (4 mg) by mouth, Starting Mon 12/13/2023, Historical Med      Multiple Vitamin (MULTIVITAMIN) capsule Take 1 capsule by mouth daily, Historical Med  ondansetron  (ZOFRAN -ODT) 4 MG disintegrating tablet Take 1 tablet (4 mg total) by mouth every 8 (eight) hours as needed for Nausea, Starting Mon 04/28/2021, E-Rx      pantoprazole  (PROTONIX ) 40 MG tablet Take 1 tablet (40 mg) by mouth daily, Starting Tue 02/15/2024, E-Rx      pregabalin (LYRICA) 75 MG capsule Take 1 mg by mouth, Starting Sun 10/24/2023, Historical Med      valACYclovir  HCL (VALTREX ) 500 MG tablet TAKE 1 TABLET(500 MG) BY  MOUTH DAILY., E-Rx      !! VITAMIN D  PO Take by mouth, Historical Med       !! - Potential duplicate medications found. Please discuss with provider.             Brenda Ole POUR, MD  04/24/24 [phone]

## 2024-04-22 NOTE — ED Provider Notes (Signed)
 [facility]  Emergency Department APP Note      Diagnosis/Disposition     ED Disposition:  Discharge    ED Diagnosis:     Lumbar radiculopathy  Left leg pain    Discharge Medication List as of 04/22/2024 12:23 PM        START taking these medications    Details   acetaminophen  (TYLENOL ) 500 MG tablet Take 2 tablets (1,000 mg) by mouth every 6 (six) hours as needed for Pain, Starting Sat 04/22/2024, E-Rx      docusate sodium  (COLACE) 100 MG capsule Take 1 capsule (100 mg) by mouth 2 (two) times daily for 7 days, Starting Sat 04/22/2024, Until Sat 04/29/2024, E-Rx      meloxicam (MOBIC) 7.5 MG tablet Take 1 tablet (7.5 mg) by mouth once daily, Starting Sat 04/22/2024, E-Rx      morphine  (MSIR) 15 MG immediate release tablet Take 1 tablet (15 mg) by mouth every 4 (four) hours as needed for Pain, Starting Sat 04/22/2024, Until Sat 04/29/2024 at 2359, E-Rx      naloxone  (NARCAN ) 4 MG/0.1ML nasal spray 1 spray intranasally. If pt does not respond or relapses into respiratory depression call 911. Give additional doses every 2-3 min., E-Rx             History of Present Illness      Chief Complaint: Leg Pain and Knee Pain     73 y.o. female with past medical history as below   History of Present Illness  Amber [name] is a 73 year old female who presents with left leg pain radiating from the thigh to the heel. She is accompanied by her son.    She experiences constant pain radiating from her left thigh to her heel, including the knee, which began on Sunday while singing in church. The pain worsens with sitting, laying down, standing, bending over, and driving. Ice, a heating pad, and lidocaine  provide some relief. Prednisone  was discontinued due to severe stomach cramping and insomnia.    She underwent left knee replacement three years ago and has had issues with the knee since, including numbness in the left calf. A sonogram ruled out blood clots, and x-rays of the knee and back were performed. She has a  compression fracture at L1 from a fall in 2019, treated with a brace for six weeks.    Current medications include Lyrica, which she has not tried because she is afraid to take it, and gabapentin , which provides temporary relief. Gastric bypass surgery in 2015 limits her ability to take NSAIDs. No fever, urinary, or bowel issues are present. Her heel is 'ice cold' to the touch.      Physical Exam     ED Triage Vitals [04/22/24 1044]   Encounter Vitals Group      BP 180/88      Systolic BP Percentile       Diastolic BP Percentile       Heart Rate 66      Resp Rate 16      Temp 98 F (36.7 C)      Temp src Oral      SpO2 97 %      Weight 70.8 kg      Height       Head Circumference       Peak Flow       Pain Score 10      Pain Loc       Pain Education  Exclude from Growth Chart       Physical Exam  Vitals and nursing note reviewed.   Constitutional:       General: She is not in acute distress.     Appearance: She is not ill-appearing.   HENT:      Head: Normocephalic and atraumatic.     Eyes:      Extraocular Movements: Extraocular movements intact.      Pupils: Pupils are equal, round, and reactive to light.       Cardiovascular:      Rate and Rhythm: Normal rate and regular rhythm.      Pulses: Normal pulses.      Heart sounds: Normal heart sounds.   Pulmonary:      Effort: Pulmonary effort is normal. No respiratory distress.      Breath sounds: Normal breath sounds.     Musculoskeletal:      Cervical back: Normal range of motion and neck supple. No tenderness or bony tenderness.      Thoracic back: No bony tenderness.      Lumbar back: Tenderness (midline) present. Normal range of motion.      Left hip: Normal range of motion.      Left upper leg: No swelling or bony tenderness.      Left knee: No swelling or deformity. Tenderness present.      Left lower leg: No swelling.      Right ankle: Normal pulse.      Left ankle: Normal pulse.      Right foot: Normal capillary refill. Normal pulse.      Left foot:  Normal capillary refill. Normal pulse.     Skin:     General: Skin is warm and dry.      Capillary Refill: Capillary refill takes less than 2 seconds.     Neurological:      Mental Status: She is alert.             Medical Decision Making        PRIMARY PROBLEM LIST      1. 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 Previous treatments provided limited relief. Prednisone  discontinued due to severe side effects.  - Advised pt to try her prescribed lyrica. Do not take concurrently with gabapentin .    Sciatica  Suspected sciatica with pain radiating from left thigh to heel, possibly due to sciatic nerve pressure. Previous L1 fracture may contribute.  - Order CT scan of the spine to evaluate for nerve compression.  - Administer Toradol  injection for anti-inflammatory effect.  - Administer Pepcid  to protect stomach from NSAID effects.  - Prescribe morphine  tablets for breakthrough pain. Advised increased fall risk and not to operate heavy machinery during use     Compression fracture of L1  L1 compression fracture treated in 2019. Current symptoms may require re-evaluation.  - Order CT scan of the spine to assess L1 status and rule out new fractures.    Gastric bypass status  Gastric bypass in 2015. Unable to tolerate long-term NSAIDs due to gastric risks. Can try  short-term use as pt has failed other treatments  - Administer Pepcid  to protect stomach from NSAID effects during acute treatment.    The patient was deemed stable for discharge. They were given strict return precautions as it relates to their presumed  diagnosis, verbalized understanding of these precautions and agreed to follow up as instructed with ortho. All questions were answered prior to discharge.    Additional Notes          External records reviewed: [place]  LLE 04/21/24: negative for DVT                         ED Course as of 04/24/24 1333   Sat Apr 22, 2024   1120 S/p L TKR 3 years ago  No fever, chronic numbness left calf   Spine surgeon- Dr Joaquin  [MM]   1223 CT neg for fracture. Stable for d/c. Pt driving home so no opioids here. She will f/up with her ortho. Return precautions discussed  [MM]      ED Course User Index  [MM] Tymier Lindholm L, PA           Vital Signs: Reviewed the patient's vital signs.   Nursing Notes: Reviewed and utilized available nursing notes.   Medical Records Reviewed: Reviewed available past medical records.   Counseling: The emergency provider has spoken with the patient and discussed today's findings, in addition to providing specific details for the plan of care. Questions are answered and there is agreement with the plan.     CRITICAL CARE/PROCEDURES    Procedures         CARDIAC STUDIES      The following cardiac studies were independently interpreted by me the Emergency Medicine Provider. For full cardiac study results please see chart.                                                                   EMERGENCY IMAGING STUDIES      The following imaging studies were independently interpreted by me (emergency medicine provider):                                       Supplemental Encounter Data   Medical History[1]  Past Surgical History[2]  Social History[3]  Family History[4]  Allergies[5]    Encounter Orders:  Orders Placed This Encounter   Procedures    CT L- Spine without Contrast     Medications Administered:  Medications   acetaminophen  (TYLENOL ) tablet 1,000 mg (1,000 mg Oral Given 04/22/24 1141)   ketorolac  (TORADOL ) injection 30 mg (30 mg Intramuscular Given 04/22/24 1141)     Laboratory and Imaging Studies:     CT  L- Spine without Contrast   Final Result          1.Degenerative changes with lower lumbar foraminal narrowing, as above. No   apparent canal stenosis. MRI could better evaluate as clinically warranted.   2.No acute fracture.   3.Slight lumbar levocurvature.      Lynwood Artist Gee MD, MD   04/22/2024 11:57 AM        Attestations     Discussed  case with ED attending, Dr Brenda, who is agreeable to the plan of care, treatment and disposition.        Darlene Vernard CROME, [place]  04/24/24 1333         [1]   Past Medical History:  Diagnosis Date    Asthma     Bronchitis 01/02/2019    Chest pain 2016     Chest pain after eating x6  months--being followed by GI for GERD    Claustrophobia     MRIs    Constipation     Genital herpes     no current outbreak (10/03/15)    GERD (gastroesophageal reflux disease)     Hiatal hernia     h/o surgery    Hyperlipidemia     Hypertensive disorder     Well controlled on meds per pt. Elevated with pain.    Low back pain     Nausea without vomiting     OSA on CPAP 2015    CPAP nightly x 1 yr, used nightly.    Osteoarthritis of knees, bilateral     and back    SBO (small bowel obstruction) (CMS/HCC) 09/2018    TIA (transient ischemic attack) 2014    TIA 2014-no residual. Patient evaulated for possible TIA 07/12/2015  @ [facility] (dischage summary in epic)-per summary CT of head was normal and patient was d/c'd   [2]   Past Surgical History:  Procedure Laterality Date    APPENDECTOMY (OPEN)  >25 yrs    CHOLECYSTECTOMY      EGD  01/2015    EGD N/A 01/23/2019    Procedure: EGD;  Surgeon: Rozanne Pitcairn, MD;  Location: [facility];  Service: General;  Laterality: N/A;  ENDOSCOPY  MD REQ=30MINS; Q1=UNK    EGD, BIOPSY N/A 09/15/2017    Procedure: EGD, BIOPSY;  Surgeon: Wilhelmina Jordan Basque, MD;  Location: [facility];  Service: Gastroenterology;  Laterality: N/A;    fallopian tube surgery  >25 yrs    INSERTION, PAIN PUMP (MEDICAL) N/A 10/22/2015    Procedure: INSERTION, PAIN PUMP (MEDICAL);  Surgeon:  Eliberto Oris SAUNDERS, DO;  Location: [facility];  Service: General;  Laterality: N/A;    JOINT REPLACEMENT      LAPAROSCOPIC, CHOLECYSTECTOMY, CHOLANGIOGRAM  08/13/2014    LAPAROSCOPIC, GASTRIC BYPASS N/A 10/22/2015    Procedure: LAPAROSCOPIC, GASTRIC BYPASS;  Surgeon: Eliberto, Hamid R, DO;  Location: [facility];  Service: General;  Laterality: N/A;    LAPAROSCOPIC, HERNIORRHAPHY, HIATAL N/A 10/22/2015    Procedure: LAPAROSCOPIC, HERNIORRHAPHY, HIATAL;  Surgeon: Eliberto Oris SAUNDERS, DO;  Location: [facility];  Service: General;  Laterality: N/A;    LAPAROSCOPIC, LYSIS, ADHESIONS N/A 09/01/2018    Procedure: LAPAROSCOPIC, LYSIS, ADHESIONS RELEASE OF  SMALL BOWEL OBSTRUCTION;  Surgeon: Rozanne Pitcairn, MD;  Location: [facility];  Service: General;  Laterality: N/A;    LAPAROSCOPY, DIAGNOSTIC N/A 09/01/2018    Procedure: LAPAROSCOPY, DIAGNOSTIC;  Surgeon: Rozanne Pitcairn, MD;  Location: [facility];  Service: General;  Laterality: N/A;    LAPAROSCOPY, DIAGNOSTIC N/A 07/16/2021    Procedure: LAPAROSCOPY, DIAGNOSTIC;  Surgeon: Eliberto Oris SAUNDERS, DO;  Location: [facility];  Service: General;  Laterality: N/A;    OVARY SURGERY Right >25 yrs    REPLACEMENT TOTAL KNEE Left     SPINE SURGERY  11/2013    cervical HNP x 2, Dr. Joaquin    TOOTH EXTRACTION N/A    [3]  Social History  Tobacco Use    Smoking status: Never    Smokeless tobacco: Never   Vaping Use    Vaping status: Never Used   Substance Use Topics    Alcohol  use: Yes     Comment: occ.    Drug use: Never   [4]   Family History  Problem Relation Name Age of Onset    Cancer Mother  37        breast cancer    Breast cancer Mother      Heart disease Father      Malignant hyperthermia Neg Hx      Pseudochol deficiency Neg Hx     [5]   Allergies  Allergen Reactions    Acyclovir Nausea And Vomiting     Other reaction(s): gi distress    Amlodipine       nausea    Amoxicillin-Pot Clavulanate      Other reaction(s): gi distress       Atorvastatin        Palpitation    Carvedilol       Extreme fatigue    Hydralazine       Headache      Lisinopril       nausea    Lovastatin Nausea And Vomiting     Other reaction(s): gi distress    Metronidazole Nausea And Vomiting     Other reaction(s): gi distress    Moxifloxacin Nausea And Vomiting         GI symptoms    Rosuvastatin      Palpitation, chest pain, abd pain     Statins      Chest pain, palpitations.    Sulfa Antibiotics Nausea And Vomiting     Other reaction(s): gi distress        Darlene Vernard CROME, [place]  04/24/24 1334

## 2024-04-25 ENCOUNTER — Telehealth (INDEPENDENT_AMBULATORY_CARE_PROVIDER_SITE_OTHER): Payer: Self-pay | Admitting: Internal Medicine

## 2024-04-25 NOTE — Telephone Encounter (Signed)
 Attempted to call patient for ED follow up visit. LMTCB.

## 2024-04-26 ENCOUNTER — Encounter (HOSPITAL_BASED_OUTPATIENT_CLINIC_OR_DEPARTMENT_OTHER): Payer: Self-pay

## 2024-04-30 ENCOUNTER — Other Ambulatory Visit (INDEPENDENT_AMBULATORY_CARE_PROVIDER_SITE_OTHER): Payer: Self-pay | Admitting: Internal Medicine

## 2024-04-30 DIAGNOSIS — I10 Essential (primary) hypertension: Secondary | ICD-10-CM

## 2024-05-01 NOTE — Telephone Encounter (Signed)
 Last filled October 2024.   Last o/v April 2025.  Patient has an upcoming appointment on August 16 2024.  Queued up 100 with 1 refills.

## 2024-05-02 NOTE — Telephone Encounter (Signed)
 Spoke to patient and provided radiology's number to schedule MRI for August. Pt to call today and schedule

## 2024-05-03 ENCOUNTER — Other Ambulatory Visit (INDEPENDENT_AMBULATORY_CARE_PROVIDER_SITE_OTHER): Payer: Self-pay | Admitting: Cardiology

## 2024-05-09 ENCOUNTER — Encounter (INDEPENDENT_AMBULATORY_CARE_PROVIDER_SITE_OTHER): Payer: Self-pay

## 2024-05-16 ENCOUNTER — Encounter (HOSPITAL_BASED_OUTPATIENT_CLINIC_OR_DEPARTMENT_OTHER): Payer: Self-pay

## 2024-06-04 ENCOUNTER — Ambulatory Visit
Admission: RE | Admit: 2024-06-04 | Discharge: 2024-06-04 | Disposition: A | Discharge status: Home / Self Care | Attending: Diagnostic Radiology | Admitting: Diagnostic Radiology

## 2024-06-04 DIAGNOSIS — K863 Pseudocyst of pancreas: Secondary | ICD-10-CM | POA: Insufficient documentation

## 2024-06-04 DIAGNOSIS — K862 Cyst of pancreas: Secondary | ICD-10-CM | POA: Insufficient documentation

## 2024-06-04 DIAGNOSIS — Z1509 Genetic susceptibility to other malignant neoplasm: Secondary | ICD-10-CM | POA: Insufficient documentation

## 2024-06-04 DIAGNOSIS — Z1501 Genetic susceptibility to malignant neoplasm of breast: Secondary | ICD-10-CM | POA: Insufficient documentation

## 2024-06-04 DIAGNOSIS — Z800 Family history of malignant neoplasm of digestive organs: Secondary | ICD-10-CM | POA: Insufficient documentation

## 2024-06-04 MED ORDER — GADOTERIDOL 279.3 MG/ML IV SOLN
16.0000 mL | Freq: Once | INTRAVENOUS | Status: AC | PRN
Start: 2024-06-04 — End: 2024-06-04
  Administered 2024-06-04: 8 mmol via INTRAVENOUS

## 2024-06-06 ENCOUNTER — Encounter (HOSPITAL_BASED_OUTPATIENT_CLINIC_OR_DEPARTMENT_OTHER): Payer: Self-pay | Admitting: Gastroenterology

## 2024-06-06 ENCOUNTER — Encounter (INDEPENDENT_AMBULATORY_CARE_PROVIDER_SITE_OTHER): Payer: Self-pay | Admitting: Cardiology

## 2024-06-06 DIAGNOSIS — K863 Pseudocyst of pancreas: Secondary | ICD-10-CM

## 2024-06-06 DIAGNOSIS — Z1501 Genetic susceptibility to malignant neoplasm of breast: Secondary | ICD-10-CM

## 2024-06-06 NOTE — Progress Notes (Signed)
 THIS FORM IS FOR INTERNAL USE FOR CARDIOLOGY AND IS NOT A PROGRESS NOTE    Chart Prep Check List:  Completed Process Reviewed Notes   [x]  Review last office visit note. If "orders placed, ensure that the results are in the chart.    Refresh CareEverywhere- outside results may appear here.   If not in chart:         - Call the patient and clarify if they have done testing outside of [facility].             - If the patient has completed testing outside of [facility], request that the patient bring the results to their appointment or ask for facility information so you can call and request results to be faxed to clinic. Once faxed, scanned into chart.  - If unable to obtain, call patient and request for patient to bring physical copy to [facility] or provide in MyChart message.    If the patient has not completed testing, confirm with the provider if it is okay to proceed with the scheduled appointment or if the patient needs to schedule testing and follow up afterward. Contact designated PAA to reschedule appointment and diagnostic testing.    [x]  Check to make sure all recent labs are available/completed  If PCP is within [place], will appear in Epic.   If outside of [facility], call to obtain records. Put in blue sticky note labs are scanned, specify lab, and date of labs.   If no PCP in chart, call patient to confirm no recent labs from outside facility. CBC, BMP, Lipid 02/15/24   [x]  Pend EKG, if needed  If EKG is completed, please provide date of last EKG in blue sticky note  pended   []  If cardiac clearance:  Add physician/surgeon to care team  Document physician, name of procedure, and fax to the right and in  blue sticky note   Check media and concord for cardiac clearance request. If in concord, upload the request to chart.         Disclaimer: If calling patient, please ensure check list is completed prior to have all information/ questions readily available.

## 2024-06-07 ENCOUNTER — Encounter (INDEPENDENT_AMBULATORY_CARE_PROVIDER_SITE_OTHER): Payer: Self-pay | Admitting: Cardiology

## 2024-06-09 ENCOUNTER — Encounter (INDEPENDENT_AMBULATORY_CARE_PROVIDER_SITE_OTHER): Payer: Self-pay

## 2024-06-16 ENCOUNTER — Other Ambulatory Visit (HOSPITAL_BASED_OUTPATIENT_CLINIC_OR_DEPARTMENT_OTHER): Payer: Self-pay

## 2024-06-16 DIAGNOSIS — C50911 Malignant neoplasm of unspecified site of right female breast: Secondary | ICD-10-CM

## 2024-06-21 ENCOUNTER — Encounter (INDEPENDENT_AMBULATORY_CARE_PROVIDER_SITE_OTHER): Payer: Self-pay | Admitting: Cardiology

## 2024-06-21 ENCOUNTER — Ambulatory Visit (INDEPENDENT_AMBULATORY_CARE_PROVIDER_SITE_OTHER): Admitting: Cardiology

## 2024-06-21 VITALS — BP 181/102 | HR 62 | Ht 61.0 in | Wt 172.0 lb

## 2024-06-21 DIAGNOSIS — E7800 Pure hypercholesterolemia, unspecified: Secondary | ICD-10-CM

## 2024-06-21 DIAGNOSIS — I10 Essential (primary) hypertension: Secondary | ICD-10-CM

## 2024-06-21 LAB — ECG 12-LEAD
Atrial Rate: 57 {beats}/min
P Axis: 72 degrees
P-R Interval: 184 ms
Q-T Interval: 412 ms
QRS Duration: 78 ms
QTC Calculation (Bezet): 401 ms
R Axis: 15 degrees
T Axis: 33 degrees
Ventricular Rate: 57 {beats}/min

## 2024-06-21 MED ORDER — HYDROCHLOROTHIAZIDE 25 MG PO TABS
12.5000 mg | ORAL_TABLET | ORAL | 3 refills | Status: AC
Start: 2024-06-21 — End: ?

## 2024-06-21 NOTE — Progress Notes (Signed)
 Amber [name] 73 y.o. with a history of HTN presents for cardiac follow up of hypertension.     Since her gastric surgery, she has been intolerant to different medications due to GI distress.       Hypertension: Multiple medication intolerances since bariatric surgery.  Currently she is on losartan  50 mg twice a day and atenolol  100 mg daily.   She is currently on hydrochlorothiazide  12.5 mg every other day.  Home blood pressures reviewed, blood pressure is not perfect, but she is currently tolerating this regimen.  Systolic usually in the 140s and diastolic in the 80s at home.  She was intolerant to the recently prescribed  clonidine  oral and patch, nifedipine  ( coughing).   With higher doses of daily losartan  she had significant nausea.  She also had nausea with amlodipine .    She had nausea with lisinopril  in the past     Cerebrovascular disease: History of a TIA.  She has had no recurrence.  This TIA occurred when she was not on aspirin .  Currently compliant with aspirin .      Cardiac work up has included    Echocardiogram (04/29/2023): Normal LV/RV size and systolic function.  No significant valvular abnormalities    Stress test (05/30/2021): Normal myocardial perfusion study.  No evidence of ischemia or infarct     Echocardiogram (04/04/2020): Normal LV/RV size and systolic function.  Ejection fraction 60%.  Mild left ventricular hypertrophy.  No significant valvular heart disease.  No pericardial effusion.                    - stress test (11/06/17 - normal myocardial perfusion)               - cardiac cath ( 01/08/14 - normal )              - echocardiogram ( EF 55%, mild TR)        Current Outpatient Medications   Medication Sig Dispense Refill    acetaminophen  (TYLENOL ) 500 MG tablet Take 2 tablets (1,000 mg) by mouth every 6 (six) hours as needed for Pain 60 tablet 0    aspirin  EC 81 MG EC tablet Take 1 tablet (81 mg total) by mouth daily. 30 tablet 1    atenolol  (TENORMIN ) 100 MG tablet TAKE 1 TABLET(100  MG) BY MOUTH DAILY 100 tablet 0    cefdinir (OMNICEF) 300 MG capsule Take 1 capsule (300 mg) by mouth 2 (two) times daily      Cholecalciferol (Vitamin D3) 1.25 MG (50000 UT) Cap Take 1 capsule by mouth once a week 12 capsule 3    Cyanocobalamin  (B-12 IJ) Inject as directed every 30 (thirty) days         diazePAM  (VALIUM ) 5 MG tablet Take 1 tablet (5 mg) by mouth every 8 (eight) hours as needed for Anxiety 30 tablet 2    fluticasone  (FLONASE ) 50 MCG/ACT nasal spray 2 sprays by Nasal route daily. 16 g 2    gabapentin  (NEURONTIN ) 300 MG capsule daily as needed      hydroCHLOROthiazide  (HYDRODIURIL ) 25 MG tablet Take 0.5 tablets (12.5 mg) by mouth every other day 23 tablet 3    losartan  (COZAAR ) 50 MG tablet TAKE 1 TABLET(50 MG) BY MOUTH TWICE DAILY 180 tablet 0    Medrol  4 MG tablet Take 1 tablet (4 mg) by mouth      meloxicam  (MOBIC ) 7.5 MG tablet Take 1 tablet (7.5 mg) by mouth once daily  15 tablet 0    Multiple Vitamin (MULTIVITAMIN) capsule Take 1 capsule by mouth daily      naloxone  (NARCAN ) 4 MG/0.1ML nasal spray 1 spray intranasally. If pt does not respond or relapses into respiratory depression call 911. Give additional doses every 2-3 min. 2 each 0    ondansetron  (ZOFRAN -ODT) 4 MG disintegrating tablet Take 1 tablet (4 mg total) by mouth every 8 (eight) hours as needed for Nausea 30 tablet 2    pantoprazole  (PROTONIX ) 40 MG tablet Take 1 tablet (40 mg) by mouth daily 90 tablet 1    pregabalin (LYRICA) 75 MG capsule Take 1 mg by mouth      valACYclovir  HCL (VALTREX ) 500 MG tablet TAKE 1 TABLET(500 MG) BY MOUTH DAILY. 100 tablet 1    VITAMIN D  PO Take by mouth       No current facility-administered medications for this visit.            PE:    Vitals:    06/21/24 0859   BP: (!) 181/102   Pulse: 62   SpO2: 100%     Body mass index is 32.5 kg/m.    Physical Examination: General appearance - alert, well appearing, and in no distress  Mental status - alert, oriented to person, place, and time  Chest - clear to  auscultation, no wheezes, rales or rhonchi, symmetric air entry  Heart - normal rate and regular rhythm, S1 and S2 normal  Abdomen - soft, nontender, nondistended, no masses or organomegaly  Extremities - no pedal edema noted    -Exam unchanged from prior visit        Labs:  Lipid Panel   Cholesterol   Date/Time Value Ref Range Status   02/15/2024 12:22 PM 203 (H) <=199 mg/dL Final   96/94/7975 91:91 AM 188 <200 mg/dL Final     Triglycerides   Date/Time Value Ref Range Status   02/15/2024 12:22 PM 53 34 - 149 mg/dL Final   96/94/7975 91:91 AM 59 <150 mg/dL Final   96/75/7976 90:54 AM 39 34 - 149 mg/dL Final     HDL   Date/Time Value Ref Range Status   02/15/2024 12:22 PM 76 >=40 mg/dL Final     Comment:     An HDL Cholesterol 40mg /dL is low and constitutes a coronary heart disease risk factor, and an HDL Cholesterol >59mg /dL is a negative risk factor for CHD.     Reference: American Heart Association; Circulation 2004   01/05/2023 08:08 AM 77 > OR = 50 mg/dL Final       CMP:   Sodium   Date/Time Value Ref Range Status   02/15/2024 12:22 PM 144 135 - 145 mEq/L Final   01/05/2023 08:08 AM 142 135 - 146 mmol/L Final     Potassium   Date/Time Value Ref Range Status   02/15/2024 12:22 PM 4.1 3.5 - 5.3 mEq/L Final   01/05/2023 08:08 AM 4.4 3.5 - 5.3 mmol/L Final     Chloride   Date/Time Value Ref Range Status   02/15/2024 12:22 PM 110 99 - 111 mEq/L Final   01/05/2023 08:08 AM 106 98 - 110 mmol/L Final     CO2   Date/Time Value Ref Range Status   02/15/2024 12:22 PM 19 17 - 29 mEq/L Final   01/05/2023 08:08 AM 29 20 - 32 mmol/L Final     Glucose   Date/Time Value Ref Range Status   02/15/2024 12:22 PM 83 70 - 100 mg/dL  Final     Comment:     ADA Guidelines for diabetes mellitus:  Fasting: Equal to or greater than 126 mg/dL  Random: Equal to or greater than 200 mg/dL   96/94/7975 91:91 AM 87 65 - 99 mg/dL Final     Comment:                   Fasting reference interval          BUN   Date/Time Value Ref Range Status    02/15/2024 12:22 PM 11 7 - 21 mg/dL Final   96/94/7975 91:91 AM 9 7 - 25 mg/dL Final     Protein, Total   Date/Time Value Ref Range Status   02/15/2024 12:22 PM 6.7 6.0 - 8.3 g/dL Final   96/94/7975 91:91 AM 6.6 6.1 - 8.1 g/dL Final     Alkaline Phosphatase   Date/Time Value Ref Range Status   02/15/2024 12:22 PM 78 37 - 117 U/L Final   01/05/2023 08:08 AM 102 37 - 153 U/L Final     AST (SGOT)   Date/Time Value Ref Range Status   02/15/2024 12:22 PM 22 <=41 U/L Final   01/05/2023 08:08 AM 21 10 - 35 U/L Final     ALT   Date/Time Value Ref Range Status   02/15/2024 12:22 PM 16 <=55 U/L Final   01/05/2023 08:08 AM 15 6 - 29 U/L Final     Anion Gap   Date/Time Value Ref Range Status   02/15/2024 12:22 PM 15.0 5.0 - 15.0 Final     Comment:     Calculated Anion Gap = Na - (Cl + CO2)  Interpret with caution; calculated Anion Gap may not reflect patient's true clinical status.     This is a calculated value and platform-dependent. A value >12.0 has been recommended for the management of Hyperglycemic Crises: Diabetic Ketoacidosis and Hyperglycemic Hyperosmolar State.Med Clin [place]. 2017;101(3):587-606.doi,10.1016/j.mcna.2016.12.011   01/23/2022 09:45 AM 4.0 (L) 5.0 - 15.0 Final     Comment:     Calculated AGAP = Na - (CL + CO2)  Interpret with caution; calculated AGAP may not  reflect patient's true clinical status.  This is a calculated value and platform-dependent.  A value >12.0 has been recommended for the management of  Hyperglycemic Crises: Diabetic Ketoacidosis and Hyperglycemic  Hyperosmolar State. Med Clin [place]. 2017;101(3):587-606.  doi:10.1016/j.mcna.2016.12.011         CBC:   WBC   Date/Time Value Ref Range Status   02/15/2024 12:22 PM 4.15 3.10 - 9.50 x10 3/uL Final   01/05/2023 08:08 AM 3.6 (L) 3.8 - 10.8 Thousand/uL Final   06/30/2009 04:03 PM 9.12 3.50 - 10.80 /CUMM Final     RBC   Date/Time Value Ref Range Status   02/15/2024 12:22 PM 4.60 3.90 - 5.10 x10 6/uL Final   01/05/2023 08:08 AM 4.85 3.80  - 5.10 Million/uL Final     Hemoglobin   Date/Time Value Ref Range Status   02/15/2024 12:22 PM 13.5 11.4 - 14.8 g/dL Final   96/94/7975 91:91 AM 13.8 11.7 - 15.5 g/dL Final     Hematocrit   Date/Time Value Ref Range Status   02/15/2024 12:22 PM 42.6 34.7 - 43.7 % Final   01/05/2023 08:08 AM 43.6 35.0 - 45.0 % Final     MCV   Date/Time Value Ref Range Status   02/15/2024 12:22 PM 92.6 78.0 - 96.0 fL Final   01/05/2023 08:08 AM 89.9 80.0 -  100.0 fL Final     MCHC   Date/Time Value Ref Range Status   02/15/2024 12:22 PM 31.7 31.5 - 35.8 g/dL Final   96/94/7975 91:91 AM 31.7 (L) 32.0 - 36.0 g/dL Final     RDW   Date/Time Value Ref Range Status   02/15/2024 12:22 PM 14 11 - 15 % Final   01/05/2023 08:08 AM 13.7 11.0 - 15.0 % Final     Platelet Count   Date/Time Value Ref Range Status   02/15/2024 12:22 PM 238 142 - 346 x10 3/uL Final     Platelets   Date/Time Value Ref Range Status   01/05/2023 08:08 AM 261 140 - 400 Thousand/uL Final   08/16/2018 07:34 AM 252 140 - 400 Thousand/uL Final           Impression / plan    Hypertension: Intolerant to many medications since gastric bypass surgery.  Continue current regimen.      Follow-up with cardiology in 6 months      Katheren JAYSON Sharps, MD  06/21/2024

## 2024-06-22 NOTE — Progress Notes (Signed)
 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  Presents today for routine follow-up.       ASSESSMENT/PLAN  73 year old female with:  #. Hx bilateral breast cancer and BRCA1 mutation positive.  Today 06/23/2024 pt is clinically stable and feeling well, no s/s concerning for disease recurrence based on H&P.  Most recent screening mammogram from 12/21/23 was benign. Continue with surveillance.     #. BRCA1 mutation positive. S/p bilateral salpingo-oophorectomy.   - High risk breast cancer surveillance with yearly screening mammogram alternating with yearly bilateral screening MRI.  -- Last screening breast MRI on 07/17/23 with benign findings.  -- Screening mammogram done 12/21/23 was stable and benign.   -- Due for screening breast MRI in September 2025, scheduled 07/16/24. Due for screening mammogram in Februay 2026 (ordered)    # Skin change left breast, 06/23/24 area of concern is at about 7oclock 3-4cm from nipple, lower inner quadrant, area of hyperpigmentation is about 1cm in diameter, not raised, light brown. Possibly the beginning of a seborrheic keratosis. To monitor    - Pancreatic cancer surveillance screening. family history. Brca1 +. Patient follows Dr Zackary. MRI alternating with endoscopy every other year.  -- 06/22/23 EUS: showed stable small (4mm) cystic lesion in pancreatic head w/o solid mass or main duct involvement.  No other significant pathology identified.    -- Due for repeat EUS 2026  06/04/24 MRI abd: Stable tiny (5 mm) T2 hyperintensity in the pancreatic head without pancreatic ductal dilation, not significantly changed since 08/21/2013.     Follow-up q 6 months with MD for breast exam. She has breast MRI screening on 9/14 and visit with Dr. Clarine.     ----------    CURRENT  TREATMENT  Surveillance    INTERVAL HISTORY  Last seen 01/03/24 by Izetta Maris, ARNP  Jasmine Hunt presents due to concern about a new skin change on her left breast. She denies any lumps, bumps, pain.         ONCOLOGIC HISTORY (copied from prior notes)  Patient has been followed by Dr. Alto at [facility] but she just retired. 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 Unfortunately I do not have all the details about her cancers including grade. Looks like initially patient was tested negative for the genetic mutation but then she was referred to [facility] [place]  and looks like she turned out to be BRCA1 positive. She has extensive history of cancer in her family as above including father with prostate, mother with pancreatic, sister with pancreatic and breast and another sister with breast cancer x2. Looks like her brother who does not have cancer just test positive. Patient also developed left breast carcinoma in 2006 and it was a stage I and she underwent 6 cycles of CMF chemotherapy followed by radiation therapy. Also in 2006 she underwent bilateral salpingo-oophorectomy and for the last few years she has been following with GI in [place] and having EGD down to the pancreas. She also had  MRI of the pancreas in September 2021 that was unremarkable. Overall patient is doing well and she denies any problems specifically she denies any issues with her breasts, she has no abdominal pain or any symptoms.    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)    BRCA1 gene mutation positive       PAST SURGICAL HISTORY    Past Surgical History:   Procedure Laterality Date    BREAST BIOPSY Bilateral     2001, 2006    BREAST BIOPSY Left 01/19/2019    US  guided    BREAST LUMPECTOMY      2001 & 2006    HYSTERECTOMY  2008    SHOULDER SURGERY  2015     fff  FAMILY HISTORY    Family History       Problem (# of Occurrences) Relation (Name,Age of Onset)    Breast Cancer (3) Mother (53), Sister Terence Vigil), Sister    BRCA Mutation (1) Sister Terence Vigil)        Strong family history with numerous family members with BRCA 1 positivity    SOCIAL HISTORY    Social History     Social History Narrative    Not on file           ALLERGIES    Review of patient's allergies indicates:  Allergies   Allergen Reactions    Dust Mite Mixed Allergen Ext [Mite (D. Farinae)] Unknown     Allergy test came back positive for dust, reaction is unknown.    Lisinopril Other         MEDICATIONS      Current Outpatient Medications:     albuterol HFA 108 (90 Base) MCG/ACT inhaler, Inhale 2 puffs by mouth as needed for shortness of breath/wheezing (as needed per patient)., Disp: , Rfl:     amLODIPine 10 MG tablet, Take 1 tablet (10 mg) by mouth daily., Disp: , Rfl:     atorvastatin 20 MG tablet, Take 20 mg by mouth at bedtime., Disp: , Rfl:     cholecalciferol 50 mcg (2,000 unit) capsule, Take 2,000 units by mouth daily., Disp: , Rfl:     Coenzyme Q10 (COQ-10 OR), Take 1 tablet by mouth as needed., Disp: , Rfl:     Cyanocobalamin (VITAMIN B-12 OR), Take 1 tablet by mouth as needed., Disp: , Rfl:     metoprolol tartrate 25 MG tablet, Take 1 tablet (25 mg) by mouth 2 times a day., Disp: , Rfl:     mometasone 50 MCG/ACT nasal spray, Spray 2 sprays in the nostril as needed., Disp: , Rfl:     Multiple Vitamin (Multi-Vitamin) tablet, Take 1 tablet by mouth daily., Disp: , Rfl:     Omega-3 Fatty Acids (OMEGA 3 OR), Take 1 tablet  by mouth as needed., Disp: , Rfl:     OMEPRAZOLE OR, Take 1 tablet by mouth as needed., Disp: , Rfl:     polyethylene glycol 3350  with electrolytes (Golytely ) 236 g solution, Take per instructions already provided to patient for bowel prep., Disp: 4000 mL, Rfl: 0         PHYSICAL EXAM    There were no vitals taken for this visit.   ECOG: 0  General: Pleasant, well-appearing. No acute distress. Calm, appropriate affect. Accompanied by her sister.  HEENT: Sclerae anicteric.   CV: Regular rate and rhythm, no murmur, no LE edema.  Lungs: Clear to auscultation bilaterally.  Normal respiratory effort.  Breast exam 06/23/24  Left breast: Normal appearance of skin and contour. No evidence of edema, erythema, retraction, dimpling or peau d'orange on the overlying skin. Nipples everted. To palpation, no discrete masses. No left axillary lymphadenopathy. Area of concern is at about 7oclock 3-4cm from nipple, lower inner quadrant, area of hyperpigmentation is about 1cm in diameter, not raised, light brown. Possibly the beginning of a seborrheic keratosis.   Right breast: Normal appearance of skin and contour. No evidence of edema, erythema, retraction, dimpling or peau d'orange on the overlying skin. Nipples everted. To palpation, no discrete masses. No right axillary lymphadenopathy  Neuro: Alert and oriented x4. Grossly nonfocal.      I personally spent a total of 25 minutes on this encounter   Preparing to see the patient  Obtaining and/or reviewing separately obtained history   Performing a medically appropriate examination and/or evaluation   Counseling and educating the patient/family/caregiver   Ordering medications, tests or procedures   Documenting clinical information in the EHR or other health record     All questions and concerns were addressed during the encounter today, patient instructed to call clinic with any questions or concerns [facility] clinic phone #[phone])    Harlene Ryder MSN, AGNP-C, [facility]  Nurse  Practitioner  Hematology Oncology

## 2024-06-23 ENCOUNTER — Ambulatory Visit

## 2024-06-23 VITALS — BP 132/79 | HR 85 | Temp 98.7°F | Resp 16 | Wt 172.9 lb

## 2024-06-23 DIAGNOSIS — C50911 Malignant neoplasm of unspecified site of right female breast: Secondary | ICD-10-CM | POA: Insufficient documentation

## 2024-06-23 DIAGNOSIS — C50912 Malignant neoplasm of unspecified site of left female breast: Secondary | ICD-10-CM | POA: Insufficient documentation

## 2024-06-23 DIAGNOSIS — Z08 Encounter for follow-up examination after completed treatment for malignant neoplasm: Secondary | ICD-10-CM | POA: Insufficient documentation

## 2024-06-23 DIAGNOSIS — R234 Changes in skin texture: Secondary | ICD-10-CM | POA: Insufficient documentation

## 2024-06-23 DIAGNOSIS — Z853 Personal history of malignant neoplasm of breast: Secondary | ICD-10-CM | POA: Insufficient documentation

## 2024-07-10 ENCOUNTER — Encounter (INDEPENDENT_AMBULATORY_CARE_PROVIDER_SITE_OTHER): Payer: Self-pay

## 2024-07-11 ENCOUNTER — Encounter (INDEPENDENT_AMBULATORY_CARE_PROVIDER_SITE_OTHER): Payer: Self-pay

## 2024-07-16 ENCOUNTER — Ambulatory Visit
Admission: RE | Admit: 2024-07-16 | Discharge: 2024-07-16 | Disposition: A | Discharge status: Home / Self Care | Attending: Body Imaging | Admitting: Body Imaging

## 2024-07-16 DIAGNOSIS — C50912 Malignant neoplasm of unspecified site of left female breast: Secondary | ICD-10-CM | POA: Insufficient documentation

## 2024-07-16 DIAGNOSIS — Z1501 Genetic susceptibility to malignant neoplasm of breast: Secondary | ICD-10-CM | POA: Insufficient documentation

## 2024-07-16 DIAGNOSIS — C50911 Malignant neoplasm of unspecified site of right female breast: Secondary | ICD-10-CM | POA: Insufficient documentation

## 2024-07-16 DIAGNOSIS — Z1509 Genetic susceptibility to other malignant neoplasm: Secondary | ICD-10-CM | POA: Insufficient documentation

## 2024-07-16 MED ORDER — GADOTERIDOL 279.3 MG/ML IV SOLN
16.0000 mL | Freq: Once | INTRAVENOUS | Status: AC | PRN
Start: 2024-07-16 — End: 2024-07-16
  Administered 2024-07-16: 8 mmol via INTRAVENOUS

## 2024-07-19 ENCOUNTER — Ambulatory Visit (HOSPITAL_BASED_OUTPATIENT_CLINIC_OR_DEPARTMENT_OTHER)

## 2024-07-21 ENCOUNTER — Ambulatory Visit: Attending: Internal Medicine | Admitting: Internal Medicine

## 2024-07-21 ENCOUNTER — Encounter (HOSPITAL_BASED_OUTPATIENT_CLINIC_OR_DEPARTMENT_OTHER): Payer: Self-pay | Admitting: Internal Medicine

## 2024-07-21 VITALS — BP 131/81 | HR 84 | Temp 97.8°F | Resp 16 | Wt 175.7 lb

## 2024-07-21 DIAGNOSIS — Z1502 Genetic susceptibility to malignant neoplasm of ovary: Secondary | ICD-10-CM | POA: Insufficient documentation

## 2024-07-21 DIAGNOSIS — C50912 Malignant neoplasm of unspecified site of left female breast: Secondary | ICD-10-CM

## 2024-07-21 DIAGNOSIS — Z08 Encounter for follow-up examination after completed treatment for malignant neoplasm: Secondary | ICD-10-CM | POA: Insufficient documentation

## 2024-07-21 DIAGNOSIS — C50911 Malignant neoplasm of unspecified site of right female breast: Secondary | ICD-10-CM

## 2024-07-21 DIAGNOSIS — Z853 Personal history of malignant neoplasm of breast: Secondary | ICD-10-CM | POA: Insufficient documentation

## 2024-07-21 DIAGNOSIS — Z1231 Encounter for screening mammogram for malignant neoplasm of breast: Secondary | ICD-10-CM

## 2024-07-21 DIAGNOSIS — Z1509 Genetic susceptibility to other malignant neoplasm: Secondary | ICD-10-CM | POA: Insufficient documentation

## 2024-07-21 DIAGNOSIS — Z1501 Genetic susceptibility to malignant neoplasm of breast: Secondary | ICD-10-CM | POA: Insufficient documentation

## 2024-07-21 NOTE — Progress Notes (Signed)
 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  Presents today for routine follow-up.       ASSESSMENT/PLAN  73 year old female with:    #. Hx bilateral breast cancer and BRCA1 mutation positive.  Today 07/21/2024 pt is clinically stable and feeling well, no s/s concerning for disease recurrence based on H&P and recent MRI from 07/16/24.     #. BRCA1 mutation positive. S/p bilateral salpingo-oophorectomy.   - High risk breast cancer surveillance with yearly screening mammogram alternating with yearly bilateral screening MRI.  -- Last screening breast MRI on 07/16/24 with benign findings.  -- Screening mammogram done 12/21/23 was stable and benign.   -- Due for screening breast MRI in September 2026screening mammogram in Februay 2026 (order placed)     - Pancreatic cancer surveillance screening. family history. Brca1 +. Patient follows Dr Zackary. MRI alternating with endoscopy every other year.  -- 06/22/23 EUS: showed stable small (4mm) cystic lesion in pancreatic head w/o solid mass or main duct involvement.  No other significant pathology identified.   -- MRI 06/04/24: Stable tiny (5 mm) T2 hyperintensity in the pancreatic head without pancreatic ductal dilation, not significantly changed since 08/21/2013.   -- Due for a repeat EUS 2026        Follow-up:  RTC 6 months for breast exam; f/u sooner if clinically indicated      ----------    CURRENT TREATMENT  Surveillance    INTERVAL HISTORY  Jasmine Hunt is here for a routine follow up unaccompanied. She reports overall she is doing well. Went to ER in June with palpitations. Started B bl which makes her tired but she is adjusting. Palpitations are controlled. She has had a cardiac work-up that has been largely unrevealing, echo normal ER.  She has no concerns re:  her breasts.  Has not noticed any new lumps or bumps.  No concerning skin changes or nipple discharge.  Her energy level is good overall.  No headaches or new neuro s/s.    Denies pain.  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        ONCOLOGIC HISTORY  Patient has been followed by Dr. Alto at [facility] but she just retired.  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 Unfortunately I do not have all the details about her cancers including grade. Looks like initially patient was tested negative for the genetic mutation but then she was referred to [facility] [place]  and looks like she turned out to be BRCA1 positive. She has extensive history of cancer in her family as above including father with prostate, mother with pancreatic, sister with pancreatic and breast and another sister with breast cancer x2. Looks like her brother who does not have cancer just test positive. Patient also developed left breast carcinoma in 2006 and it was a stage I and she underwent 6 cycles of CMF chemotherapy followed by radiation therapy. Also in 2006 she underwent bilateral salpingo-oophorectomy and for the last few years she has been following  with GI in [place] and having EGD down to the pancreas. She also had MRI of the pancreas in September 2021 that was unremarkable. Overall patient is doing well and she denies any problems specifically she denies any issues with her breasts, she has no abdominal pain or any symptoms.    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)    BRCA1 gene mutation positive       PAST SURGICAL HISTORY    Past Surgical History:   Procedure Laterality Date    BREAST BIOPSY Bilateral     2001, 2006    BREAST BIOPSY Left 01/19/2019    US  guided    BREAST LUMPECTOMY      2001 & 2006    HYSTERECTOMY  2008    OOPHORECTOMY      SHOULDER SURGERY  2015    UPPER GASTROINTESTINAL ENDOSCOPY       fff  FAMILY HISTORY    Family History       Problem (# of Occurrences) Relation (Name,Age of Onset)    Cancer (2) Other Jasmine Hunt), Sister Jasmine Hunt)    Hypertension (4) Other Jasmine Hunt), Sister Jasmine Hunt), Brother (Jasmine Hunt), Brother Jasmine Hunt)    Arthritis (1) Other Jasmine Hunt)    Breast Cancer (3) Mother (67), Sister Jasmine Hunt), Sister    BRCA Mutation (1) Sister Jasmine Hunt Vigil)        Strong family history with numerous family members with BRCA 1 positivity    SOCIAL HISTORY    Social History     Social History Narrative    Not on file           ALLERGIES    Review of patient's allergies indicates:  Allergies   Allergen Reactions    Dust Mite Mixed Allergen Ext [Mite (D. Farinae)] Unknown     Allergy test came back positive for dust, reaction is unknown.    Lisinopril Other         MEDICATIONS      Current Outpatient Medications:     albuterol HFA 108 (90 Base) MCG/ACT inhaler, Inhale 2 puffs by mouth as needed for shortness of breath/wheezing (as needed per patient)., Disp: , Rfl:     amLODIPine 10 MG tablet, Take 1 tablet (10 mg) by mouth daily., Disp: , Rfl:     atorvastatin 20 MG tablet, Take 20 mg by mouth at bedtime., Disp: , Rfl:     cholecalciferol 50 mcg (2,000 unit) capsule, Take 2,000 units by mouth daily., Disp: , Rfl:     Coenzyme Q10 (COQ-10 OR), Take 1 tablet by mouth as needed., Disp: , Rfl:     Cyanocobalamin (VITAMIN B-12 OR), Take 1 tablet by mouth as needed., Disp: , Rfl:     cycloSPORINE 0.05 % ophthalmic emulsion, Place 1 drop in each EYE 2 times a day., Disp: , Rfl:     metoprolol tartrate 25 MG tablet, Take 1  tablet (25 mg) by mouth 2 times a day. (Patient taking differently: Take 1 tablet (25 mg) by mouth daily.), Disp: , Rfl:     mometasone 50 MCG/ACT nasal spray, Spray 2 sprays in the nostril as needed., Disp: , Rfl:     Multiple Vitamin (Multi-Vitamin) tablet, Take 1 tablet by mouth daily., Disp: ,  Rfl:     Omega-3 Fatty Acids (OMEGA 3 OR), Take 1 tablet by mouth as needed., Disp: , Rfl:     OMEPRAZOLE OR, Take 1 tablet by mouth as needed., Disp: , Rfl:     polyethylene glycol 3350  with electrolytes (Golytely ) 236 g solution, Take per instructions already provided to patient for bowel prep., Disp: 4000 mL, Rfl: 0         PHYSICAL EXAM    BP 131/81   Pulse 84   Temp 36.6 C (Oral)   Resp 16   Wt 79.7 kg (175 lb 11.2 oz)   SpO2 95%   BMI 30.16 kg/m    ECOG: 0  General: Pleasant, well-appearing. No acute distress. Calm, appropriate affect. Accompanied by her sister.  HEENT: Sclerae anicteric.   CV: Regular rate and rhythm, no murmur, no LE edema.  Lungs: Clear to auscultation bilaterally.  Normal respiratory effort.  Breast: No concerning skin or nipple changes.  No palpable discrete masses or adenopathy bilaterally.  Neuro: Alert and oriented x4. Grossly nonfocal.      ----------    Sim Pleasure, MD   Medical Oncology, [facility]    Time Statement: I spent a total of 20 minutes for the patient's care on the date of service.

## 2024-07-25 ENCOUNTER — Other Ambulatory Visit (INDEPENDENT_AMBULATORY_CARE_PROVIDER_SITE_OTHER): Payer: Self-pay | Admitting: Internal Medicine

## 2024-07-25 DIAGNOSIS — Z1231 Encounter for screening mammogram for malignant neoplasm of breast: Secondary | ICD-10-CM

## 2024-07-27 ENCOUNTER — Ambulatory Visit
Admission: RE | Admit: 2024-07-27 | Discharge: 2024-07-27 | Disposition: A | Discharge status: Home / Self Care | Source: Ambulatory Visit | Attending: Internal Medicine | Admitting: Internal Medicine

## 2024-07-27 DIAGNOSIS — Z1231 Encounter for screening mammogram for malignant neoplasm of breast: Secondary | ICD-10-CM | POA: Insufficient documentation

## 2024-07-28 ENCOUNTER — Other Ambulatory Visit (INDEPENDENT_AMBULATORY_CARE_PROVIDER_SITE_OTHER): Payer: Self-pay | Admitting: Internal Medicine

## 2024-07-28 ENCOUNTER — Other Ambulatory Visit (INDEPENDENT_AMBULATORY_CARE_PROVIDER_SITE_OTHER): Payer: Self-pay | Admitting: Cardiology

## 2024-07-28 DIAGNOSIS — I10 Essential (primary) hypertension: Secondary | ICD-10-CM

## 2024-07-28 NOTE — Telephone Encounter (Signed)
 Last filled June 2025.   Last o/v April 2025.  Patient has an upcoming appointment on August 16 2024.  Queued up 100 with 0 refills.

## 2024-07-31 ENCOUNTER — Ambulatory Visit (INDEPENDENT_AMBULATORY_CARE_PROVIDER_SITE_OTHER): Payer: Self-pay | Admitting: Internal Medicine

## 2024-07-31 NOTE — Progress Notes (Signed)
 Mammogram is normal    Rec:  1. Continue routine annual screening

## 2024-08-03 ENCOUNTER — Other Ambulatory Visit (INDEPENDENT_AMBULATORY_CARE_PROVIDER_SITE_OTHER): Payer: Self-pay | Admitting: Internal Medicine

## 2024-08-09 ENCOUNTER — Encounter (INDEPENDENT_AMBULATORY_CARE_PROVIDER_SITE_OTHER): Payer: Self-pay

## 2024-08-10 ENCOUNTER — Encounter (INDEPENDENT_AMBULATORY_CARE_PROVIDER_SITE_OTHER): Payer: Self-pay

## 2024-08-16 ENCOUNTER — Encounter (INDEPENDENT_AMBULATORY_CARE_PROVIDER_SITE_OTHER): Payer: Self-pay | Admitting: Internal Medicine

## 2024-08-16 ENCOUNTER — Ambulatory Visit (INDEPENDENT_AMBULATORY_CARE_PROVIDER_SITE_OTHER): Admitting: Internal Medicine

## 2024-08-16 VITALS — BP 171/86 | HR 65 | Temp 98.0°F | Ht 61.0 in | Wt 172.0 lb

## 2024-08-16 DIAGNOSIS — I10 Essential (primary) hypertension: Secondary | ICD-10-CM

## 2024-08-16 DIAGNOSIS — A6000 Herpesviral infection of urogenital system, unspecified: Secondary | ICD-10-CM

## 2024-08-16 DIAGNOSIS — F419 Anxiety disorder, unspecified: Secondary | ICD-10-CM

## 2024-08-16 DIAGNOSIS — K219 Gastro-esophageal reflux disease without esophagitis: Secondary | ICD-10-CM

## 2024-08-16 DIAGNOSIS — R0989 Other specified symptoms and signs involving the circulatory and respiratory systems: Secondary | ICD-10-CM

## 2024-08-16 DIAGNOSIS — R110 Nausea: Secondary | ICD-10-CM

## 2024-08-16 DIAGNOSIS — K529 Noninfective gastroenteritis and colitis, unspecified: Secondary | ICD-10-CM

## 2024-08-16 MED ORDER — ONDANSETRON 4 MG PO TBDP
4.0000 mg | ORAL_TABLET | Freq: Three times a day (TID) | ORAL | 2 refills | Status: AC | PRN
Start: 2024-08-16 — End: ?

## 2024-08-16 MED ORDER — DIAZEPAM 5 MG PO TABS
5.0000 mg | ORAL_TABLET | Freq: Three times a day (TID) | ORAL | 2 refills | Status: AC | PRN
Start: 2024-08-16 — End: ?

## 2024-08-16 MED ORDER — PANTOPRAZOLE SODIUM 40 MG PO TBEC
40.0000 mg | DELAYED_RELEASE_TABLET | Freq: Every day | ORAL | 1 refills | Status: AC
Start: 2024-08-16 — End: ?

## 2024-08-16 MED ORDER — VALACYCLOVIR HCL 500 MG PO TABS
ORAL_TABLET | ORAL | 1 refills | Status: AC
Start: 2024-08-16 — End: ?

## 2024-08-16 NOTE — Assessment & Plan Note (Addendum)
 Orders:    ondansetron  (ZOFRAN -ODT) 4 MG disintegrating tablet; Dissolve 1 tablet (4 mg) in the mouth every 8 (eight) hours as needed for Nausea

## 2024-08-16 NOTE — Assessment & Plan Note (Addendum)
 Orders:    diazePAM (VALIUM) 5 MG tablet; Take 1 tablet (5 mg) by mouth every 8 (eight) hours as needed for Anxiety

## 2024-08-16 NOTE — Progress Notes (Signed)
 Have you seen any specialists/other providers since your last visit with us ?    ED, admission at [facility] 08/05/24    The patient is due for:   Health Maintenance Due   Topic Date Due    Tetanus Ten-Year  02/25/2009    Medicare Annual Wellness Visit  03/28/2020    COVID-19 Vaccine (5 - 2025-26 season) 07/03/2024

## 2024-08-16 NOTE — Assessment & Plan Note (Addendum)
 Orders:    pantoprazole  (PROTONIX ) 40 MG tablet; Take 1 tablet (40 mg) by mouth once daily

## 2024-08-16 NOTE — Progress Notes (Signed)
 [facility]          HPI     Chief Complaint   Patient presents with    Hospital Follow-up     Seen at [facility] on 08/05/24 for abdominal pain      HPI  Pt is here for follow up following problems:     Recent hosp adm 08/05/24 for abd pain n/v, diarrhea  Low grade temp    CT abd/pelvis 08/05/24 showed:    IMPRESSION:   1. Short segment asymmetric wall thickening of the proximal ascending   colon is suspicious for colitis versus neoplasm, recommend correlation   with colonoscopy.   2. Wall thickening of the sigmoid colon may be secondary to   underdistention versus colitis. No other significant abnormality.     Rx'd abx x 5d,     +nausea, no vomiting,   Diarrhea resolved,   No bloody stools.   No fever,     Has ov for Cscope next wk     HTN, home BP 150-160's/90's, fluctuating, sees card.   No cp, no sob,     Anxiety, stable, takes valium  prn.     GERD, stable , takes med daily, , no dysphagia     HSV, takes med daily no recent breakout.     Review of Systems   Constitutional:  Negative for appetite change, chills, fever and unexpected weight change.   Respiratory:  Negative for shortness of breath.    Cardiovascular:  Negative for chest pain and palpitations.   Gastrointestinal:  Positive for nausea. Negative for abdominal pain, blood in stool, constipation, diarrhea and vomiting.   Genitourinary:  Negative for dysuria.   Musculoskeletal:  Negative for myalgias.   Neurological:  Negative for syncope, light-headedness and headaches.       Objective   BP 171/86 (BP Site: Right arm, Patient Position: Sitting, Cuff Size: Medium)   Pulse 65   Temp 98 F (36.7 C)   Ht 1.549 m (5' 1)   Wt 78 kg (172 lb)   LMP  (LMP Unknown)   BMI 32.50 kg/m   Physical Exam  Vitals reviewed.   Constitutional:       General: She is not in acute distress.     Appearance: She is well-developed.   Eyes:      General: No scleral icterus.     Conjunctiva/sclera: Conjunctivae normal.   Neck:      Thyroid : No thyromegaly.       Vascular: No carotid bruit or JVD.   Cardiovascular:      Rate and Rhythm: Normal rate and regular rhythm.      Heart sounds: Normal heart sounds. No murmur heard.     No friction rub. No gallop.   Pulmonary:      Effort: Pulmonary effort is normal. No respiratory distress.      Breath sounds: Normal breath sounds. No rales.   Abdominal:      General: Bowel sounds are normal. There is no distension.      Palpations: Abdomen is soft.      Tenderness: There is no abdominal tenderness. There is no guarding or rebound.   Musculoskeletal:      Cervical back: Neck supple.      Right lower leg: No edema.      Left lower leg: No edema.   Neurological:      Mental Status: She is alert.         Assessment/Plan  Assessment & Plan  Colitis         Nausea    Orders:    ondansetron  (ZOFRAN -ODT) 4 MG disintegrating tablet; Dissolve 1 tablet (4 mg) in the mouth every 8 (eight) hours as needed for Nausea    Anxiety    Orders:    diazePAM  (VALIUM ) 5 MG tablet; Take 1 tablet (5 mg) by mouth every 8 (eight) hours as needed for Anxiety    Herpes simplex infection of genitourinary system    Orders:    valACYclovir  HCL (VALTREX ) 500 MG tablet; TAKE 1 TABLET(500 MG) BY MOUTH DAILY.    Gastroesophageal reflux disease, unspecified whether esophagitis present    Orders:    pantoprazole  (PROTONIX ) 40 MG tablet; Take 1 tablet (40 mg) by mouth once daily    Essential hypertension         Labile hypertension         Rx refills. Continue meds  Rx zofran   Keep f/u with GI  Monitor home BP, follow up card as directed  F/u 6 months or sooner prn

## 2024-09-09 ENCOUNTER — Encounter (INDEPENDENT_AMBULATORY_CARE_PROVIDER_SITE_OTHER): Payer: Self-pay

## 2024-09-10 ENCOUNTER — Encounter (INDEPENDENT_AMBULATORY_CARE_PROVIDER_SITE_OTHER): Payer: Self-pay

## 2024-09-14 ENCOUNTER — Telehealth (INDEPENDENT_AMBULATORY_CARE_PROVIDER_SITE_OTHER): Payer: Self-pay

## 2024-09-14 NOTE — Telephone Encounter (Signed)
 DARLA MCDONALD (Key: [id number])    Your information has been submitted to [employer]. [employer] will review the request and will issue a decision, typically within 1-3 days from your submission. You can check the updated outcome later by reopening this request.  If [employer] has not responded in 1-3 days or if you have any questions about your ePA request, please contact [employer] at [phone]. If you think there may be a problem with your PA request, use our live chat feature at the bottom right.  ------  LOA POUCH (Key: [id number])  PA Case ID #: [id number]  Rx #: [id number]  Need Help? Call us  at [phone]  Outcome  Approved on November 13 by [facility] Medicare 2017 [employer]  Your request has been approved  Effective Date: 11/03/2023  Authorization Expiration Date: 10/14/2024  Drug  diazePAM  5MG  tablets    Form  [employer] Electronic PA Form [id number] NCPDP)  Original Claim Info  [id number] PRECERT REQ BY MD [phone] REQUIRES PRIOR AUTHORIZATION

## 2024-10-02 ENCOUNTER — Encounter (HOSPITAL_BASED_OUTPATIENT_CLINIC_OR_DEPARTMENT_OTHER): Payer: Self-pay

## 2024-10-09 ENCOUNTER — Encounter (INDEPENDENT_AMBULATORY_CARE_PROVIDER_SITE_OTHER): Payer: Self-pay

## 2024-10-10 ENCOUNTER — Encounter (INDEPENDENT_AMBULATORY_CARE_PROVIDER_SITE_OTHER): Payer: Self-pay

## 2024-10-26 ENCOUNTER — Other Ambulatory Visit (INDEPENDENT_AMBULATORY_CARE_PROVIDER_SITE_OTHER): Payer: Self-pay | Admitting: Cardiology

## 2024-10-26 ENCOUNTER — Other Ambulatory Visit (INDEPENDENT_AMBULATORY_CARE_PROVIDER_SITE_OTHER): Payer: Self-pay | Admitting: Internal Medicine

## 2024-10-26 DIAGNOSIS — I10 Essential (primary) hypertension: Secondary | ICD-10-CM

## 2024-11-02 ENCOUNTER — Emergency Department: Payer: Self-pay

## 2024-11-06 ENCOUNTER — Encounter (HOSPITAL_BASED_OUTPATIENT_CLINIC_OR_DEPARTMENT_OTHER): Payer: Self-pay

## 2024-11-09 ENCOUNTER — Encounter (INDEPENDENT_AMBULATORY_CARE_PROVIDER_SITE_OTHER): Payer: Self-pay

## 2024-11-10 ENCOUNTER — Encounter (INDEPENDENT_AMBULATORY_CARE_PROVIDER_SITE_OTHER): Payer: Self-pay

## 2024-12-01 ENCOUNTER — Encounter (INDEPENDENT_AMBULATORY_CARE_PROVIDER_SITE_OTHER): Payer: Self-pay | Admitting: Cardiology

## 2024-12-15 ENCOUNTER — Ambulatory Visit (INDEPENDENT_AMBULATORY_CARE_PROVIDER_SITE_OTHER): Admitting: Cardiology

## 2024-12-15 DIAGNOSIS — I10 Essential (primary) hypertension: Secondary | ICD-10-CM

## 2024-12-19 ENCOUNTER — Ambulatory Visit (INDEPENDENT_AMBULATORY_CARE_PROVIDER_SITE_OTHER): Admitting: Cardiology

## 2024-12-21 ENCOUNTER — Ambulatory Visit

## 2025-02-14 ENCOUNTER — Ambulatory Visit (INDEPENDENT_AMBULATORY_CARE_PROVIDER_SITE_OTHER): Admitting: Internal Medicine

## 2818-05-02 DEATH — deceased
